# Patient Record
Sex: Female | Born: 1945
Health system: Southern US, Community
[De-identification: ages and names within clinical notes are randomized; demographics above are authoritative.]

## PROBLEM LIST (undated history)

## (undated) DIAGNOSIS — S069X9A Unspecified intracranial injury with loss of consciousness of unspecified duration, initial encounter: Secondary | ICD-10-CM

## (undated) DIAGNOSIS — Z950 Presence of cardiac pacemaker: Secondary | ICD-10-CM

## (undated) DIAGNOSIS — D649 Anemia, unspecified: Secondary | ICD-10-CM

## (undated) DIAGNOSIS — Z95810 Presence of automatic (implantable) cardiac defibrillator: Secondary | ICD-10-CM

## (undated) DIAGNOSIS — C4490 Unspecified malignant neoplasm of skin, unspecified: Secondary | ICD-10-CM

## (undated) DIAGNOSIS — Z8719 Personal history of other diseases of the digestive system: Secondary | ICD-10-CM

## (undated) DIAGNOSIS — E669 Obesity, unspecified: Secondary | ICD-10-CM

## (undated) DIAGNOSIS — R0602 Shortness of breath: Secondary | ICD-10-CM

## (undated) DIAGNOSIS — F32A Depression, unspecified: Secondary | ICD-10-CM

## (undated) DIAGNOSIS — I428 Other cardiomyopathies: Secondary | ICD-10-CM

## (undated) DIAGNOSIS — I10 Essential (primary) hypertension: Secondary | ICD-10-CM

## (undated) DIAGNOSIS — G43909 Migraine, unspecified, not intractable, without status migrainosus: Secondary | ICD-10-CM

## (undated) DIAGNOSIS — M1990 Unspecified osteoarthritis, unspecified site: Secondary | ICD-10-CM

## (undated) DIAGNOSIS — I509 Heart failure, unspecified: Secondary | ICD-10-CM

## (undated) DIAGNOSIS — I456 Pre-excitation syndrome: Secondary | ICD-10-CM

## (undated) DIAGNOSIS — Q8789 Other specified congenital malformation syndromes, not elsewhere classified: Secondary | ICD-10-CM

## (undated) DIAGNOSIS — G4733 Obstructive sleep apnea (adult) (pediatric): Secondary | ICD-10-CM

## (undated) DIAGNOSIS — M719 Bursopathy, unspecified: Secondary | ICD-10-CM

## (undated) DIAGNOSIS — I499 Cardiac arrhythmia, unspecified: Secondary | ICD-10-CM

## (undated) DIAGNOSIS — F329 Major depressive disorder, single episode, unspecified: Secondary | ICD-10-CM

## (undated) DIAGNOSIS — K219 Gastro-esophageal reflux disease without esophagitis: Secondary | ICD-10-CM

## (undated) DIAGNOSIS — F419 Anxiety disorder, unspecified: Secondary | ICD-10-CM

## (undated) DIAGNOSIS — J189 Pneumonia, unspecified organism: Secondary | ICD-10-CM

## (undated) HISTORY — PX: SKIN CANCER EXCISION: SHX779

## (undated) HISTORY — PX: CHOLECYSTECTOMY: SHX55

## (undated) HISTORY — DX: Obstructive sleep apnea (adult) (pediatric): G47.33

---
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
## 1976-04-11 HISTORY — PX: TUBAL LIGATION: SHX77

## 1997-04-11 DIAGNOSIS — C4490 Unspecified malignant neoplasm of skin, unspecified: Secondary | ICD-10-CM

## 1997-04-11 HISTORY — DX: Unspecified malignant neoplasm of skin, unspecified: C44.90

## 1997-04-11 HISTORY — PX: SKIN CANCER EXCISION: SHX779

## 1998-09-30 ENCOUNTER — Emergency Department (HOSPITAL_COMMUNITY): Admission: EM | Admit: 1998-09-30 | Discharge: 1998-09-30 | Payer: Self-pay | Admitting: Emergency Medicine

## 1998-12-12 ENCOUNTER — Emergency Department (HOSPITAL_COMMUNITY): Admission: EM | Admit: 1998-12-12 | Discharge: 1998-12-12 | Payer: Self-pay | Admitting: Emergency Medicine

## 1999-01-31 ENCOUNTER — Emergency Department (HOSPITAL_COMMUNITY): Admission: EM | Admit: 1999-01-31 | Discharge: 1999-01-31 | Payer: Self-pay | Admitting: Emergency Medicine

## 1999-01-31 ENCOUNTER — Encounter: Payer: Self-pay | Admitting: Emergency Medicine

## 1999-02-15 ENCOUNTER — Ambulatory Visit (HOSPITAL_COMMUNITY): Admission: RE | Admit: 1999-02-15 | Discharge: 1999-02-15 | Payer: Self-pay | Admitting: Emergency Medicine

## 1999-02-16 ENCOUNTER — Ambulatory Visit (HOSPITAL_COMMUNITY): Admission: RE | Admit: 1999-02-16 | Discharge: 1999-02-16 | Payer: Self-pay | Admitting: Emergency Medicine

## 1999-02-16 ENCOUNTER — Encounter: Payer: Self-pay | Admitting: Emergency Medicine

## 1999-05-13 ENCOUNTER — Ambulatory Visit (HOSPITAL_COMMUNITY): Admission: RE | Admit: 1999-05-13 | Discharge: 1999-05-13 | Payer: Self-pay | Admitting: Gastroenterology

## 1999-05-13 ENCOUNTER — Encounter: Payer: Self-pay | Admitting: Gastroenterology

## 1999-06-09 ENCOUNTER — Encounter: Payer: Self-pay | Admitting: Gastroenterology

## 1999-06-09 ENCOUNTER — Encounter: Admission: RE | Admit: 1999-06-09 | Discharge: 1999-06-09 | Payer: Self-pay | Admitting: Gastroenterology

## 1999-07-18 ENCOUNTER — Emergency Department (HOSPITAL_COMMUNITY): Admission: EM | Admit: 1999-07-18 | Discharge: 1999-07-18 | Payer: Self-pay | Admitting: *Deleted

## 1999-07-18 ENCOUNTER — Encounter: Payer: Self-pay | Admitting: Emergency Medicine

## 1999-07-19 ENCOUNTER — Encounter: Payer: Self-pay | Admitting: Emergency Medicine

## 1999-10-13 ENCOUNTER — Emergency Department (HOSPITAL_COMMUNITY): Admission: EM | Admit: 1999-10-13 | Discharge: 1999-10-13 | Payer: Self-pay | Admitting: Emergency Medicine

## 1999-10-13 ENCOUNTER — Encounter: Payer: Self-pay | Admitting: Emergency Medicine

## 1999-11-24 ENCOUNTER — Ambulatory Visit (HOSPITAL_COMMUNITY): Admission: RE | Admit: 1999-11-24 | Discharge: 1999-11-24 | Payer: Self-pay | Admitting: Gastroenterology

## 2000-05-15 ENCOUNTER — Other Ambulatory Visit: Admission: RE | Admit: 2000-05-15 | Discharge: 2000-05-15 | Payer: Self-pay | Admitting: Internal Medicine

## 2000-08-12 ENCOUNTER — Emergency Department (HOSPITAL_COMMUNITY): Admission: EM | Admit: 2000-08-12 | Discharge: 2000-08-12 | Payer: Self-pay | Admitting: Emergency Medicine

## 2000-08-12 ENCOUNTER — Encounter: Payer: Self-pay | Admitting: Internal Medicine

## 2000-08-23 ENCOUNTER — Encounter: Payer: Self-pay | Admitting: Internal Medicine

## 2000-08-23 ENCOUNTER — Encounter: Admission: RE | Admit: 2000-08-23 | Discharge: 2000-08-23 | Payer: Self-pay | Admitting: Internal Medicine

## 2000-11-21 ENCOUNTER — Emergency Department (HOSPITAL_COMMUNITY): Admission: EM | Admit: 2000-11-21 | Discharge: 2000-11-21 | Payer: Self-pay | Admitting: *Deleted

## 2001-01-12 ENCOUNTER — Emergency Department (HOSPITAL_COMMUNITY): Admission: EM | Admit: 2001-01-12 | Discharge: 2001-01-12 | Payer: Self-pay | Admitting: *Deleted

## 2001-01-12 ENCOUNTER — Encounter: Payer: Self-pay | Admitting: Emergency Medicine

## 2002-01-03 ENCOUNTER — Encounter: Payer: Self-pay | Admitting: Emergency Medicine

## 2002-01-03 ENCOUNTER — Emergency Department (HOSPITAL_COMMUNITY): Admission: EM | Admit: 2002-01-03 | Discharge: 2002-01-03 | Payer: Self-pay | Admitting: Emergency Medicine

## 2002-04-02 ENCOUNTER — Emergency Department (HOSPITAL_COMMUNITY): Admission: EM | Admit: 2002-04-02 | Discharge: 2002-04-02 | Payer: Self-pay | Admitting: Emergency Medicine

## 2002-07-18 ENCOUNTER — Observation Stay (HOSPITAL_COMMUNITY): Admission: EM | Admit: 2002-07-18 | Discharge: 2002-07-19 | Payer: Self-pay

## 2003-01-29 ENCOUNTER — Encounter: Admission: RE | Admit: 2003-01-29 | Discharge: 2003-01-29 | Payer: Self-pay | Admitting: Internal Medicine

## 2003-01-29 ENCOUNTER — Encounter: Payer: Self-pay | Admitting: Internal Medicine

## 2003-01-30 ENCOUNTER — Encounter: Payer: Self-pay | Admitting: General Surgery

## 2003-01-30 ENCOUNTER — Inpatient Hospital Stay (HOSPITAL_COMMUNITY): Admission: EM | Admit: 2003-01-30 | Discharge: 2003-01-31 | Payer: Self-pay | Admitting: Emergency Medicine

## 2005-05-10 ENCOUNTER — Ambulatory Visit (HOSPITAL_COMMUNITY): Admission: RE | Admit: 2005-05-10 | Discharge: 2005-05-10 | Payer: Self-pay | Admitting: Internal Medicine

## 2005-05-31 ENCOUNTER — Encounter: Admission: RE | Admit: 2005-05-31 | Discharge: 2005-05-31 | Payer: Self-pay | Admitting: Internal Medicine

## 2011-03-17 ENCOUNTER — Other Ambulatory Visit (HOSPITAL_COMMUNITY)
Admission: RE | Admit: 2011-03-17 | Discharge: 2011-03-17 | Disposition: A | Discharge status: Home / Self Care | Payer: Medicare Other | Source: Ambulatory Visit | Attending: Family Medicine | Admitting: Family Medicine

## 2011-03-17 ENCOUNTER — Other Ambulatory Visit: Payer: Self-pay

## 2011-03-17 ENCOUNTER — Other Ambulatory Visit: Payer: Self-pay | Admitting: Family Medicine

## 2011-03-17 DIAGNOSIS — Z01419 Encounter for gynecological examination (general) (routine) without abnormal findings: Secondary | ICD-10-CM | POA: Insufficient documentation

## 2011-03-17 DIAGNOSIS — N644 Mastodynia: Secondary | ICD-10-CM

## 2011-03-30 ENCOUNTER — Ambulatory Visit
Admission: RE | Admit: 2011-03-30 | Discharge: 2011-03-30 | Disposition: A | Discharge status: Home / Self Care | Payer: Medicare Other | Source: Ambulatory Visit | Attending: Family Medicine | Admitting: Family Medicine

## 2011-03-30 DIAGNOSIS — N644 Mastodynia: Secondary | ICD-10-CM

## 2011-04-27 ENCOUNTER — Other Ambulatory Visit: Payer: Self-pay | Admitting: Physician Assistant

## 2011-04-27 DIAGNOSIS — C4491 Basal cell carcinoma of skin, unspecified: Secondary | ICD-10-CM

## 2011-04-27 HISTORY — DX: Basal cell carcinoma of skin, unspecified: C44.91

## 2011-06-10 HISTORY — PX: MOHS SURGERY: SUR867

## 2011-09-07 ENCOUNTER — Other Ambulatory Visit: Payer: Self-pay | Admitting: Interventional Cardiology

## 2011-09-10 DIAGNOSIS — I509 Heart failure, unspecified: Secondary | ICD-10-CM

## 2011-09-10 HISTORY — DX: Heart failure, unspecified: I50.9

## 2011-09-12 ENCOUNTER — Encounter (HOSPITAL_BASED_OUTPATIENT_CLINIC_OR_DEPARTMENT_OTHER)
Admission: RE | Disposition: A | Discharge status: Home / Self Care | Payer: Self-pay | Source: Ambulatory Visit | Attending: Interventional Cardiology

## 2011-09-12 ENCOUNTER — Encounter (HOSPITAL_BASED_OUTPATIENT_CLINIC_OR_DEPARTMENT_OTHER): Payer: Self-pay | Admitting: *Deleted

## 2011-09-12 ENCOUNTER — Inpatient Hospital Stay (HOSPITAL_BASED_OUTPATIENT_CLINIC_OR_DEPARTMENT_OTHER)
Admission: RE | Admit: 2011-09-12 | Discharge: 2011-09-12 | Disposition: A | Discharge status: Home / Self Care | Payer: Medicare Other | Source: Ambulatory Visit | Attending: Interventional Cardiology | Admitting: Interventional Cardiology

## 2011-09-12 DIAGNOSIS — I519 Heart disease, unspecified: Secondary | ICD-10-CM | POA: Insufficient documentation

## 2011-09-12 DIAGNOSIS — I428 Other cardiomyopathies: Secondary | ICD-10-CM | POA: Insufficient documentation

## 2011-09-12 DIAGNOSIS — I2789 Other specified pulmonary heart diseases: Secondary | ICD-10-CM | POA: Insufficient documentation

## 2011-09-12 HISTORY — DX: Gastro-esophageal reflux disease without esophagitis: K21.9

## 2011-09-12 SURGERY — JV LEFT HEART CATHETERIZATION WITH CORONARY ANGIOGRAM
Anesthesia: Moderate Sedation

## 2011-09-12 MED ORDER — ACETAMINOPHEN 325 MG PO TABS: 650.0000 mg | ORAL_TABLET | ORAL | Status: DC | PRN

## 2011-09-12 MED ORDER — SODIUM CHLORIDE 0.9 % IV SOLN: INTRAVENOUS | Status: DC

## 2011-09-12 MED ORDER — SODIUM CHLORIDE 0.9 % IJ SOLN: 3.0000 mL | INTRAMUSCULAR | Status: DC | PRN

## 2011-09-12 MED ORDER — MORPHINE SULFATE 2 MG/ML IJ SOLN: 1.0000 mg | INTRAMUSCULAR | Status: DC | PRN

## 2011-09-12 MED ORDER — ASPIRIN 81 MG PO CHEW
324.0000 mg | CHEWABLE_TABLET | ORAL | Status: AC
  Administered 2011-09-12: 324 mg via ORAL

## 2011-09-12 MED ORDER — SODIUM CHLORIDE 0.9 % IV SOLN: 250.0000 mL | INTRAVENOUS | Status: DC | PRN

## 2011-09-12 MED ORDER — SODIUM CHLORIDE 0.9 % IJ SOLN: 3.0000 mL | Freq: Two times a day (BID) | INTRAMUSCULAR | Status: DC

## 2011-09-12 MED ORDER — ONDANSETRON HCL 4 MG/2ML IJ SOLN: 4.0000 mg | Freq: Four times a day (QID) | INTRAMUSCULAR | Status: DC | PRN

## 2011-09-12 MED ORDER — DIAZEPAM 5 MG PO TABS
5.0000 mg | ORAL_TABLET | ORAL | Status: AC
  Administered 2011-09-12: 5 mg via ORAL

## 2011-09-12 NOTE — Progress Notes (Signed)
Bedrest begins @ 1245. 

## 2011-09-12 NOTE — CV Procedure (Addendum)
PROCEDURE:  Left heart catheterization with selective coronary angiography, left ventriculogram. Right heart catheterization. Abdominal aortogram.  INDICATIONS:  Cardiomyopathy  The risks, benefits, and details of the procedure were explained to the patient.  The patient verbalized understanding and wanted to proceed.  Informed written consent was obtained.  PROCEDURE TECHNIQUE:  After Xylocaine anesthesia a 39F sheath was placed in the right common femoral vein, and a 82F sheath was placed in the right femoral artery with a single anterior needle wall stick.   Left coronary angiography was done using a Judkins L4 guide catheter.  Right coronary angiography was done using a Judkins R4 guide catheter.  Left ventriculography was done using a pigtail catheter.   The pigtail was pulled back to the abdominal aorta and a power injection of contrast was performed in the AP projection.   CONTRAST:  Total of 95 cc.  COMPLICATIONS:  None.    HEMODYNAMICS:  Aortic pressure was 130/72; LV pressure was 131/14; LVEDP 28.  There was no gradient between the left ventricle and aorta.  Right atrial pressure 12/9, mean right atrial pressure 9 mm Hg; RV 46/11, RVEDP 12 mm Hg, pulmonary artery 47/22, mean pulmonary artery 33 mm Hg, pulmonary capillary wedge pressure 20/18, mean PCWP 17; pulmonary artery sat 64%, aortic saturation 93%.  Rate output by Fick 4.7 L per minute, cardiac index 2.4.  ANGIOGRAPHIC DATA:   The left main coronary artery is a large vessel and widely patent..  The left anterior descending artery is a large vessel.  The first diagonal is medium size.  There is a small second diagonal.  The entire LAD system appears angiographically normal.  The left circumflex artery is a large dominant vessel.  There is a large first obtuse marginal.  The second obtuse marginal small.  The left PDA is small but widely patent.  The entire circumflex system appears angiographically normal.  The right coronary artery  is a small, nondominant vessel which is angiographically normal the and.  LEFT VENTRICULOGRAM:  Left ventricular angiogram was done in the 30 RAO projection and revealed severe, global left ventricular systolic dysfunction with an estimated ejection fraction of 20-25 %.  LVEDP was 28 mmHg. 2+ Mitral regurgitation.  ABDOMINAL AORTOGRAM: No AAA. Patent renal arteries bilaterally. Mild external iliac disease bilaterally.  Patent aortoiliac bifurcation.  IMPRESSIONS:  1. No significant coronary artery disease.  Left dominant system. 2. Severe, global left ventricular systolic dysfunction.  LVEDP 28 mmHg.  Ejection fraction 20-25 %. 3.  Moderate pulmonary hypertension.  Cardiac index greater than 2.  RECOMMENDATION:  Start ACE-I for LV dysfunction.  Will eventually need beta blocker as well.  Reassess LV function in a few months after medical therapy.  Continue diuretic.

## 2011-09-12 NOTE — H&P (Signed)
  Date of Initial H&P: 09/06/11  History reviewed, patient examined, no change in status, stable for surgery. 

## 2011-09-13 LAB — POCT I-STAT 3, ART BLOOD GAS (G3+)
pCO2 arterial: 43.8 mmHg (ref 35.0–45.0)
pH, Arterial: 7.357 (ref 7.350–7.400)
pO2, Arterial: 69 mmHg — ABNORMAL LOW (ref 80.0–100.0)

## 2011-09-13 LAB — POCT I-STAT 3, VENOUS BLOOD GAS (G3P V)
Bicarbonate: 24.3 mEq/L — ABNORMAL HIGH (ref 20.0–24.0)
O2 Saturation: 64 %
TCO2: 26 mmol/L (ref 0–100)
pCO2, Ven: 49 mmHg (ref 45.0–50.0)
pH, Ven: 7.303 — ABNORMAL HIGH (ref 7.250–7.300)
pO2, Ven: 37 mmHg (ref 30.0–45.0)

## 2011-09-18 HISTORY — PX: CARDIAC CATHETERIZATION: SHX172

## 2011-10-27 ENCOUNTER — Emergency Department (HOSPITAL_COMMUNITY)
Admission: EM | Admit: 2011-10-27 | Discharge: 2011-10-27 | Disposition: A | Discharge status: Home / Self Care | Payer: Medicare Other | Attending: Emergency Medicine | Admitting: Emergency Medicine

## 2011-10-27 ENCOUNTER — Encounter (HOSPITAL_COMMUNITY): Payer: Self-pay | Admitting: *Deleted

## 2011-10-27 ENCOUNTER — Emergency Department (HOSPITAL_COMMUNITY): Payer: Medicare Other

## 2011-10-27 DIAGNOSIS — R0602 Shortness of breath: Secondary | ICD-10-CM | POA: Insufficient documentation

## 2011-10-27 DIAGNOSIS — I10 Essential (primary) hypertension: Secondary | ICD-10-CM | POA: Insufficient documentation

## 2011-10-27 DIAGNOSIS — I509 Heart failure, unspecified: Secondary | ICD-10-CM | POA: Insufficient documentation

## 2011-10-27 DIAGNOSIS — J45909 Unspecified asthma, uncomplicated: Secondary | ICD-10-CM | POA: Insufficient documentation

## 2011-10-27 DIAGNOSIS — R0789 Other chest pain: Secondary | ICD-10-CM

## 2011-10-27 DIAGNOSIS — K219 Gastro-esophageal reflux disease without esophagitis: Secondary | ICD-10-CM | POA: Insufficient documentation

## 2011-10-27 DIAGNOSIS — R079 Chest pain, unspecified: Secondary | ICD-10-CM | POA: Insufficient documentation

## 2011-10-27 HISTORY — DX: Essential (primary) hypertension: I10

## 2011-10-27 HISTORY — DX: Heart failure, unspecified: I50.9

## 2011-10-27 LAB — COMPREHENSIVE METABOLIC PANEL
ALT: 11 U/L (ref 0–35)
AST: 17 U/L (ref 0–37)
Albumin: 3.8 g/dL (ref 3.5–5.2)
Alkaline Phosphatase: 82 U/L (ref 39–117)
Calcium: 9.3 mg/dL (ref 8.4–10.5)
Potassium: 3.9 mEq/L (ref 3.5–5.1)
Sodium: 140 mEq/L (ref 135–145)
Total Protein: 6.5 g/dL (ref 6.0–8.3)

## 2011-10-27 LAB — CBC
Hemoglobin: 12.1 g/dL (ref 12.0–15.0)
MCH: 30 pg (ref 26.0–34.0)
MCHC: 33.5 g/dL (ref 30.0–36.0)
Platelets: 228 10*3/uL (ref 150–400)

## 2011-10-27 LAB — APTT: aPTT: 29 seconds (ref 24–37)

## 2011-10-27 LAB — POCT I-STAT TROPONIN I: Troponin i, poc: 0 ng/mL (ref 0.00–0.08)

## 2011-10-27 LAB — D-DIMER, QUANTITATIVE: D-Dimer, Quant: 0.57 ug/mL-FEU — ABNORMAL HIGH (ref 0.00–0.48)

## 2011-10-27 MED ORDER — SODIUM CHLORIDE 0.9 % IV SOLN
1000.0000 mL | INTRAVENOUS | Status: DC
  Administered 2011-10-27: 500 mL via INTRAVENOUS

## 2011-10-27 MED ORDER — HYDROCODONE-ACETAMINOPHEN 5-325 MG PO TABS: 1.0000 | ORAL_TABLET | Freq: Four times a day (QID) | ORAL | Status: AC | PRN

## 2011-10-27 MED ORDER — MORPHINE SULFATE 4 MG/ML IJ SOLN
4.0000 mg | Freq: Once | INTRAMUSCULAR | Status: AC
  Administered 2011-10-27: 4 mg via INTRAVENOUS
  Filled 2011-10-27: qty 1

## 2011-10-27 NOTE — ED Notes (Signed)
Per EMS pt from home with c/o chest pain radiating to shoulder blades, began at 0700. IV 20G LAC. Pain associated with shortness of breath. Pt very anxious on EMS arrival. Dx with CHF, 20% EF. Pt given Nitro x 1 SL, aspirin 325 mg PO. VSS. Sinus brady 50's, started new beta blocker Monday- increased dose.

## 2011-10-27 NOTE — ED Notes (Signed)
Pt presents with sudden onset of mid-sternal chest pain that radiates into her back.  Pt reports cardiac cath done last month that was clear.  Pt reports episode of diaphoresis, denies any nausea.  Pt reports pain worsened as day progressed.  On arrival, pt reports onset of dizziness.  Pt reports on Monday, carvedilol dose was doubled, to "make my heart work better".

## 2011-10-27 NOTE — ED Provider Notes (Signed)
History     CSN: [id number] Arrival date & time 10/27/11  1206 First MD Initiated Contact with Patient 10/27/11 1207      Chief Complaint  Patient presents with  . Chest Pain    Patient is a 66 y.o. female presenting with chest pain. The history is provided by the patient.  Chest Pain The chest pain began 3 - 5 hours ago (Pt was helping her Mom get dressed when she suddenly had the pain.). Chest pain occurs constantly (the pain has been increasing since 7am.). The chest pain is worsening. The severity of the pain is severe. The quality of the pain is described as sharp. The pain radiates to the upper back. Primary symptoms include shortness of breath and dizziness. Pertinent negatives for primary symptoms include no fever, no nausea and no vomiting.  Dizziness does not occur with nausea or vomiting.  She tried nitroglycerin and aspirin (no change, treated en route by EMS) for the symptoms. Past medical history comments: CHF     Past Medical History  Diagnosis Date  . Asthma   . GERD (gastroesophageal reflux disease)   . CHF (congestive heart failure)   . Hypertension     History reviewed. No pertinent past surgical history.  No family history on file.  History  Substance Use Topics  . Smoking status: Never Smoker   . Smokeless tobacco: Not on file  . Alcohol Use: No    OB History    Grav Para Term Preterm Abortions TAB SAB Ect Mult Living                  Review of Systems  Constitutional: Negative for fever.  Respiratory: Positive for shortness of breath.   Cardiovascular: Positive for chest pain.  Gastrointestinal: Negative for nausea and vomiting.  Neurological: Positive for dizziness.  All other systems reviewed and are negative.    Allergies  Penicillins and Sulfonamide derivatives  Home Medications  No current outpatient prescriptions on file.  BP 103/51  Pulse 67  Temp 98.1 F (36.7 C)  Resp 18  SpO2 100%  Physical Exam  Nursing note and  vitals reviewed. Constitutional: She appears well-developed and well-nourished. No distress.  HENT:  Head: Normocephalic and atraumatic.  Right Ear: External ear normal.  Left Ear: External ear normal.  Eyes: Conjunctivae are normal. Right eye exhibits no discharge. Left eye exhibits no discharge. No scleral icterus.  Neck: Neck supple. No tracheal deviation present.  Cardiovascular: Normal rate, regular rhythm and intact distal pulses.   Pulmonary/Chest: Effort normal and breath sounds normal. No stridor. No respiratory distress. She has no wheezes. She has no rales. She exhibits tenderness.       Reproduces pain, increases with movement and palpatation of upper back as well  Abdominal: Soft. Bowel sounds are normal. She exhibits no distension. There is no tenderness. There is no rebound and no guarding.  Musculoskeletal: She exhibits no edema and no tenderness.  Neurological: She is alert. She has normal strength. No sensory deficit. Cranial nerve deficit:  no gross defecits noted. She exhibits normal muscle tone. She displays no seizure activity. Coordination normal.  Skin: Skin is warm and dry. No rash noted.  Psychiatric: She has a normal mood and affect.    ED Course  Procedures (including critical care time) EKG Rate 67 SINUS RHYTHM ~ normal P axis, V-rate 50- 99 NONSPECIFIC INTRAVENTRICULAR CONDUCTION DELAY ~ QRSd >184mS, not LBBB/RBBB ABNRM R PROG, CONSIDER ASMI OR LEAD PLACEMENT ~ Q >14mS,  diminished R, V2 MINIMAL ST DEPRESSION, DIFFUSE LEADS ~ ST <-0.51mV, ant/lat/inf Changed from prior EKG Labs Reviewed  COMPREHENSIVE METABOLIC PANEL - Abnormal; Notable for the following:    Total Bilirubin 0.2 (*)     GFR calc non Af Amer 75 (*)     GFR calc Af Amer 86 (*)     All other components within normal limits  D-DIMER, QUANTITATIVE - Abnormal; Notable for the following:    D-Dimer, Quant 0.57 (*)     All other components within normal limits  CBC  PROTIME-INR  APTT  POCT  I-STAT TROPONIN I   Dg Chest 2 View  10/27/2011  *RADIOLOGY REPORT*  Clinical Data: 66 year old female with chest pain.  CHEST - 2 VIEW  Comparison: None  Findings: Upper limits normal heart size. There is no evidence of focal airspace disease, pulmonary edema, suspicious pulmonary nodule/mass, pleural effusion, or pneumothorax. No acute bony abnormalities are identified.  IMPRESSION: No evidence of active cardiopulmonary disease.  Original Report Authenticated By: Rosendo Gros, M.D.    Prior Cath: June 2013 LEFT VENTRICULOGRAM: Left ventricular angiogram was done in the 30 RAO projection and revealed severe, global left ventricular systolic dysfunction with an estimated ejection fraction of 20-25 %. LVEDP was 28 mmHg. 2+ Mitral regurgitation.  ABDOMINAL AORTOGRAM: No AAA. Patent renal arteries bilaterally. Mild external iliac disease bilaterally. Patent aortoiliac bifurcation.  IMPRESSIONS:  1. No significant coronary artery disease. Left dominant system. 2. Severe, global left ventricular systolic dysfunction. LVEDP 28 mmHg. Ejection fraction 20-25 %. 3. Moderate pulmonary hypertension. Cardiac index greater than 2.  RECOMMENDATION: Start ACE-I for LV dysfunction. Will eventually need beta blocker as well. Reassess LV function in a few months after medical therapy. Continue diuretic.         MDM  Pt has no significant coronary artery disease based on her cardiac catheterization performed this June. The patient's chest pain started when she was bending over in helping her mother or get dressed.  On exam she has very sharp reproducible chest wall tenderness. Patient has a trivially elevated d-dimer. She has no leg swelling. Overall I feel she is low risk for pulmonary embolism. At this time he did not feel that a CT scan of her chest as indicated. Patient will be treated for chest wall pain. She is feeling better after medications here in the emergency department. I encouraged her to follow up  with her primary Dr. and to return to emergency room for any worsening symptoms        Celene Kras, MD 10/27/11 1419

## 2012-02-28 ENCOUNTER — Encounter: Payer: Self-pay | Admitting: Internal Medicine

## 2012-02-28 ENCOUNTER — Ambulatory Visit (INDEPENDENT_AMBULATORY_CARE_PROVIDER_SITE_OTHER): Payer: Medicare Other | Admitting: Internal Medicine

## 2012-02-28 ENCOUNTER — Encounter: Payer: Self-pay | Admitting: *Deleted

## 2012-02-28 VITALS — BP 128/78 | HR 70 | Resp 16 | Ht 64.0 in | Wt 199.0 lb

## 2012-02-28 DIAGNOSIS — I509 Heart failure, unspecified: Secondary | ICD-10-CM

## 2012-02-28 DIAGNOSIS — I456 Pre-excitation syndrome: Secondary | ICD-10-CM

## 2012-02-28 DIAGNOSIS — I5022 Chronic systolic (congestive) heart failure: Secondary | ICD-10-CM

## 2012-02-28 DIAGNOSIS — I519 Heart disease, unspecified: Secondary | ICD-10-CM

## 2012-02-28 DIAGNOSIS — R55 Syncope and collapse: Secondary | ICD-10-CM

## 2012-02-28 NOTE — Patient Instructions (Signed)
Your physician has recommended that you have an ablation. Catheter ablation is a medical procedure used to treat some cardiac arrhythmias (irregular heartbeats). During catheter ablation, a long, thin, flexible tube is put into a blood vessel in your groin (upper thigh), or neck. This tube is called an ablation catheter. It is then guided to your heart through the blood vessel. Radio frequency waves destroy small areas of heart tissue where abnormal heartbeats may cause an arrhythmia to start. Please see the instruction sheet given to you today.    Your physician has recommended that you have a defibrillator inserted. An implantable cardioverter defibrillator (ICD) is a small device that is placed in your chest or, in rare cases, your abdomen. This device uses electrical pulses or shocks to help control life-threatening, irregular heartbeats that could lead the heart to suddenly stop beating (sudden cardiac arrest). Leads are attached to the ICD that goes into your heart. This is done in the hospital and usually requires an overnight stay. Please see the instruction sheet given to you today for more information.

## 2012-03-01 ENCOUNTER — Encounter: Payer: Self-pay | Admitting: Internal Medicine

## 2012-03-01 DIAGNOSIS — I456 Pre-excitation syndrome: Secondary | ICD-10-CM | POA: Insufficient documentation

## 2012-03-01 DIAGNOSIS — R55 Syncope and collapse: Secondary | ICD-10-CM | POA: Insufficient documentation

## 2012-03-01 DIAGNOSIS — I5023 Acute on chronic systolic (congestive) heart failure: Secondary | ICD-10-CM | POA: Insufficient documentation

## 2012-03-01 NOTE — Assessment & Plan Note (Signed)
Her CHF is class 2 but well compensated. With her history of syncope and LV dysfunction, we have recommended ICD implant.

## 2012-03-01 NOTE — Progress Notes (Signed)
HPI Paula Booker is referred today by Dr. 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 The patient notes that she has had palpitations for many years. She has never sought medical attention. The patient has had 3 syncopal episodes, but does not feel palpitations prior to her syncopal episode. She has noted that other episodes occur without frank syncope. No other complaints. She is referred for consideration of an ICD as she has all of the above with an EF of 20%. Of note after left heart catheterization 6 months ago, she was placed on medical therapy with beta blockers and ARB drugs. Her repeat echo a few weeks ago demonstrated an EF of 20%. Allergies  Allergen Reactions  . Penicillins Anaphylaxis  . Sulfonamide Derivatives Other (See Comments)    Migrains     Current Outpatient Prescriptions  Medication Sig Dispense Refill  . albuterol (PROVENTIL HFA;VENTOLIN HFA) 108 (90 BASE) MCG/ACT inhaler Inhale 2 puffs into the lungs every 6 (six) hours as needed. For wheezing      . carvedilol (COREG) 12.5 MG tablet Take 25 mg by mouth 2 (two) times daily with a meal.      . cetirizine (ZYRTEC) 10 MG tablet Take 10 mg by mouth daily.      . Cholecalciferol (VITAMIN D3) 1000 UNITS CAPS Take 1 capsule by mouth daily.      . citalopram (CELEXA) 20 MG tablet Take 20 mg by mouth daily.      . Fluticasone-Salmeterol (ADVAIR) 250-50 MCG/DOSE AEPB Inhale 1 puff into the lungs every 12 (twelve) hours.      . furosemide (LASIX) 40 MG tablet Take 40 mg by mouth daily.      . GELATIN PO Take by mouth.      . HYDROCODONE-ACETAMINOPHEN PO Take by mouth.      . losartan (COZAAR) 50 MG tablet Take 50 mg by mouth daily.      . Misc Natural Products (OSTEO BI-FLEX ADV DOUBLE ST PO) Take 2 tablets by mouth every morning.      . naproxen sodium (ANAPROX) 220 MG tablet Take 440 mg by mouth daily.      . omeprazole (PRILOSEC) 20 MG capsule Take 40 mg by mouth daily.       . potassium chloride SA (K-DUR,KLOR-CON) 20 MEQ tablet Take 20 mEq by mouth daily.         Past Medical History  Diagnosis Date  . Asthma   . GERD (gastroesophageal reflux disease)   . CHF (congestive heart failure)   . Hypertension     ROS:   All systems reviewed and negative except as noted in the HPI.   No past surgical history on file.   No family history on file.   History   Social History  . Marital Status: Single    Spouse Name: N/A    Number of Children: N/A  . Years of Education: N/A   Occupational History  . Not on file.   Social History Main Topics  . Smoking status: Never Smoker   . Smokeless tobacco: Not on file  . Alcohol Use: No  . Drug Use: No  . Sexually Active:    Other Topics Concern  . Not on file   Social History Narrative  . No narrative on file     BP 128/78  Pulse 70  Resp 16  Ht 5' 4" (1.626 m)  Wt 199 lb (90.266 kg)    BMI 34.16 kg/m2  Physical Exam:  Well appearing middle age man, NAD HEENT: Unremarkable Neck:  No JVD, no thyromegally Lungs:  Clear with no wheezes, rales, or rhonchi. HEART:  Regular rate rhythm, no murmurs, no rubs, no clicks Abd:  soft, positive bowel sounds, no organomegally, no rebound, no guarding Ext:  2 plus pulses, no edema, no cyanosis, no clubbing Skin:  No rashes no nodules Neuro:  CN II through XII intact, motor grossly intact  EKG NSR with WPW syndrome    Assess/Plan:  

## 2012-03-01 NOTE — Assessment & Plan Note (Signed)
Her symptoms are not well controlled. I have recommended EPS/RFA and ICD implant. That said, she may have orthostasis complicating there treatment.

## 2012-03-01 NOTE — Assessment & Plan Note (Signed)
She has pre-excitation on her ECG. She has had a h/o palpitations. Will plan EP study and possible ablation if she has inducble SVT.

## 2012-03-02 ENCOUNTER — Other Ambulatory Visit: Payer: Self-pay | Admitting: *Deleted

## 2012-03-02 DIAGNOSIS — I519 Heart disease, unspecified: Secondary | ICD-10-CM

## 2012-03-02 DIAGNOSIS — I456 Pre-excitation syndrome: Secondary | ICD-10-CM

## 2012-03-07 ENCOUNTER — Encounter (HOSPITAL_COMMUNITY): Payer: Self-pay | Admitting: Pharmacy Technician

## 2012-03-11 DIAGNOSIS — Z95810 Presence of automatic (implantable) cardiac defibrillator: Secondary | ICD-10-CM

## 2012-03-11 HISTORY — DX: Presence of automatic (implantable) cardiac defibrillator: Z95.810

## 2012-03-15 ENCOUNTER — Other Ambulatory Visit (INDEPENDENT_AMBULATORY_CARE_PROVIDER_SITE_OTHER): Payer: Medicare Other

## 2012-03-15 DIAGNOSIS — I456 Pre-excitation syndrome: Secondary | ICD-10-CM

## 2012-03-15 DIAGNOSIS — I519 Heart disease, unspecified: Secondary | ICD-10-CM

## 2012-03-15 LAB — CBC WITH DIFFERENTIAL/PLATELET
Basophils Relative: 0.3 % (ref 0.0–3.0)
Eosinophils Absolute: 0.4 10*3/uL (ref 0.0–0.7)
HCT: 38.1 % (ref 36.0–46.0)
Hemoglobin: 12.5 g/dL (ref 12.0–15.0)
MCHC: 32.8 g/dL (ref 30.0–36.0)
MCV: 91.6 fl (ref 78.0–100.0)
Monocytes Absolute: 0.7 10*3/uL (ref 0.1–1.0)
Neutro Abs: 3 10*3/uL (ref 1.4–7.7)
RBC: 4.16 Mil/uL (ref 3.87–5.11)

## 2012-03-15 LAB — BASIC METABOLIC PANEL
CO2: 32 mEq/L (ref 19–32)
Chloride: 102 mEq/L (ref 96–112)
Sodium: 140 mEq/L (ref 135–145)

## 2012-03-21 MED ORDER — SODIUM CHLORIDE 0.9 % IR SOLN
80.0000 mg | Status: DC
  Filled 2012-03-21: qty 2

## 2012-03-21 MED ORDER — VANCOMYCIN HCL IN DEXTROSE 1-5 GM/200ML-% IV SOLN
1000.0000 mg | INTRAVENOUS | Status: DC
  Filled 2012-03-21 (×2): qty 200

## 2012-03-22 ENCOUNTER — Encounter (HOSPITAL_COMMUNITY)
Admission: RE | Disposition: A | Discharge status: Home / Self Care | Payer: Self-pay | Source: Ambulatory Visit | Attending: Internal Medicine

## 2012-03-22 ENCOUNTER — Encounter (HOSPITAL_COMMUNITY): Payer: Self-pay | Admitting: General Practice

## 2012-03-22 ENCOUNTER — Ambulatory Visit (HOSPITAL_COMMUNITY)
Admission: RE | Admit: 2012-03-22 | Discharge: 2012-03-23 | Disposition: A | Discharge status: Home / Self Care | Payer: Medicare Other | Source: Ambulatory Visit | Attending: Internal Medicine | Admitting: Internal Medicine

## 2012-03-22 DIAGNOSIS — R55 Syncope and collapse: Secondary | ICD-10-CM

## 2012-03-22 DIAGNOSIS — I428 Other cardiomyopathies: Secondary | ICD-10-CM | POA: Insufficient documentation

## 2012-03-22 DIAGNOSIS — I5022 Chronic systolic (congestive) heart failure: Secondary | ICD-10-CM

## 2012-03-22 DIAGNOSIS — Z79899 Other long term (current) drug therapy: Secondary | ICD-10-CM | POA: Insufficient documentation

## 2012-03-22 DIAGNOSIS — I519 Heart disease, unspecified: Secondary | ICD-10-CM

## 2012-03-22 DIAGNOSIS — I10 Essential (primary) hypertension: Secondary | ICD-10-CM | POA: Insufficient documentation

## 2012-03-22 DIAGNOSIS — I456 Pre-excitation syndrome: Secondary | ICD-10-CM | POA: Insufficient documentation

## 2012-03-22 HISTORY — PX: SUPRAVENTRICULAR TACHYCARDIA ABLATION: SHX5492

## 2012-03-22 HISTORY — PX: IMPLANTABLE CARDIOVERTER DEFIBRILLATOR IMPLANT: SHX5473

## 2012-03-22 HISTORY — PX: SUPRAVENTRICULAR TACHYCARDIA ABLATION: SHX6106

## 2012-03-22 HISTORY — DX: Unspecified malignant neoplasm of skin, unspecified: C44.90

## 2012-03-22 HISTORY — DX: Shortness of breath: R06.02

## 2012-03-22 HISTORY — DX: Presence of automatic (implantable) cardiac defibrillator: Z95.810

## 2012-03-22 HISTORY — DX: Unspecified osteoarthritis, unspecified site: M19.90

## 2012-03-22 HISTORY — PX: CARDIAC DEFIBRILLATOR PLACEMENT: SHX171

## 2012-03-22 HISTORY — DX: Migraine, unspecified, not intractable, without status migrainosus: G43.909

## 2012-03-22 SURGERY — SUPRAVENTRICULAR TACHYCARDIA ABLATION
Anesthesia: LOCAL

## 2012-03-22 MED ORDER — MUPIROCIN 2 % EX OINT
TOPICAL_OINTMENT | Freq: Two times a day (BID) | CUTANEOUS | Status: DC
  Administered 2012-03-22: 1 via NASAL
  Filled 2012-03-22: qty 22

## 2012-03-22 MED ORDER — MIDAZOLAM HCL 5 MG/5ML IJ SOLN
INTRAMUSCULAR | Status: AC
  Filled 2012-03-22: qty 5

## 2012-03-22 MED ORDER — SODIUM CHLORIDE 0.9 % IV SOLN: 250.0000 mL | INTRAVENOUS | Status: DC

## 2012-03-22 MED ORDER — HYDROXYUREA 500 MG PO CAPS
ORAL_CAPSULE | ORAL | Status: AC
  Filled 2012-03-22: qty 1

## 2012-03-22 MED ORDER — YOU HAVE A PACEMAKER BOOK
Freq: Once | Status: DC
  Filled 2012-03-22 (×2): qty 1

## 2012-03-22 MED ORDER — SODIUM CHLORIDE 0.45 % IV SOLN
INTRAVENOUS | Status: DC
  Administered 2012-03-22: 10:00:00 via INTRAVENOUS

## 2012-03-22 MED ORDER — FAMOTIDINE IN NACL 20-0.9 MG/50ML-% IV SOLN
20.0000 mg | Freq: Once | INTRAVENOUS | Status: AC
  Administered 2012-03-22: 20 mg via INTRAVENOUS

## 2012-03-22 MED ORDER — FUROSEMIDE 40 MG PO TABS
40.0000 mg | ORAL_TABLET | Freq: Every day | ORAL | Status: DC
  Administered 2012-03-22 – 2012-03-23 (×2): 40 mg via ORAL
  Filled 2012-03-22 (×2): qty 1

## 2012-03-22 MED ORDER — SODIUM CHLORIDE 0.9 % IJ SOLN: 3.0000 mL | INTRAMUSCULAR | Status: DC | PRN

## 2012-03-22 MED ORDER — MUPIROCIN 2 % EX OINT
TOPICAL_OINTMENT | CUTANEOUS | Status: AC
  Administered 2012-03-22: 1 via NASAL
  Filled 2012-03-22: qty 22

## 2012-03-22 MED ORDER — DIPHENHYDRAMINE HCL 50 MG/ML IJ SOLN
INTRAMUSCULAR | Status: AC
  Filled 2012-03-22: qty 1

## 2012-03-22 MED ORDER — ONDANSETRON HCL 4 MG/2ML IJ SOLN: 4.0000 mg | Freq: Four times a day (QID) | INTRAMUSCULAR | Status: DC | PRN

## 2012-03-22 MED ORDER — FENTANYL CITRATE 0.05 MG/ML IJ SOLN
INTRAMUSCULAR | Status: AC
  Filled 2012-03-22: qty 2

## 2012-03-22 MED ORDER — CHLORHEXIDINE GLUCONATE 4 % EX LIQD
60.0000 mL | Freq: Once | CUTANEOUS | Status: DC
  Filled 2012-03-22: qty 60

## 2012-03-22 MED ORDER — DIPHENHYDRAMINE HCL 50 MG/ML IJ SOLN
25.0000 mg | Freq: Once | INTRAMUSCULAR | Status: AC
  Administered 2012-03-22: 25 mg via INTRAVENOUS

## 2012-03-22 MED ORDER — SODIUM CHLORIDE 0.9 % IJ SOLN: 3.0000 mL | Freq: Two times a day (BID) | INTRAMUSCULAR | Status: DC

## 2012-03-22 MED ORDER — POTASSIUM CHLORIDE CRYS ER 10 MEQ PO TBCR
10.0000 meq | EXTENDED_RELEASE_TABLET | Freq: Two times a day (BID) | ORAL | Status: DC
  Administered 2012-03-22 – 2012-03-23 (×2): 10 meq via ORAL
  Filled 2012-03-22 (×3): qty 1

## 2012-03-22 MED ORDER — LOSARTAN POTASSIUM 50 MG PO TABS
50.0000 mg | ORAL_TABLET | Freq: Every day | ORAL | Status: DC
  Administered 2012-03-23: 50 mg via ORAL
  Filled 2012-03-22: qty 1

## 2012-03-22 MED ORDER — METHYLPREDNISOLONE SODIUM SUCC 125 MG IJ SOLR
125.0000 mg | Freq: Once | INTRAMUSCULAR | Status: AC
  Administered 2012-03-22: 125 mg via INTRAVENOUS

## 2012-03-22 MED ORDER — CARVEDILOL 25 MG PO TABS
25.0000 mg | ORAL_TABLET | Freq: Two times a day (BID) | ORAL | Status: DC
  Filled 2012-03-22 (×2): qty 1

## 2012-03-22 MED ORDER — METHYLPREDNISOLONE SODIUM SUCC 125 MG IJ SOLR
INTRAMUSCULAR | Status: AC
  Filled 2012-03-22: qty 2

## 2012-03-22 MED ORDER — VANCOMYCIN HCL 10 G IV SOLR
2000.0000 mg | Freq: Two times a day (BID) | INTRAVENOUS | Status: AC
  Administered 2012-03-23: 03:00:00 2000 mg via INTRAVENOUS
  Filled 2012-03-22: qty 2000

## 2012-03-22 MED ORDER — LIDOCAINE HCL (PF) 1 % IJ SOLN
INTRAMUSCULAR | Status: AC
  Filled 2012-03-22: qty 60

## 2012-03-22 MED ORDER — PANTOPRAZOLE SODIUM 40 MG PO TBEC
40.0000 mg | DELAYED_RELEASE_TABLET | Freq: Every day | ORAL | Status: DC
  Administered 2012-03-22 – 2012-03-23 (×2): 40 mg via ORAL
  Filled 2012-03-22 (×2): qty 1

## 2012-03-22 MED ORDER — CITALOPRAM HYDROBROMIDE 20 MG PO TABS
20.0000 mg | ORAL_TABLET | Freq: Every day | ORAL | Status: DC
  Administered 2012-03-23: 20 mg via ORAL
  Filled 2012-03-22: qty 1

## 2012-03-22 MED ORDER — ACETAMINOPHEN 325 MG PO TABS
325.0000 mg | ORAL_TABLET | ORAL | Status: DC | PRN
  Administered 2012-03-23: 08:00:00 650 mg via ORAL
  Filled 2012-03-22: qty 2

## 2012-03-22 MED ORDER — HEPARIN (PORCINE) IN NACL 2-0.9 UNIT/ML-% IJ SOLN
INTRAMUSCULAR | Status: AC
  Filled 2012-03-22: qty 500

## 2012-03-22 NOTE — Interval H&P Note (Signed)
History and Physical Interval Note:  03/22/2012 12:05 PM  Paula Booker  has presented today for surgery, with the diagnosis of wpw/LV disfunction  The various methods of treatment have been discussed with the patient and family. After consideration of risks, benefits and other options for treatment, the patient has consented to  Procedure(s) (LRB) with comments: SUPRAVENTRICULAR TACHYCARDIA ABLATION (N/A) IMPLANTABLE CARDIOVERTER DEFIBRILLATOR IMPLANT (N/A) as a surgical intervention .  The patient's history has been reviewed, patient examined, no change in status, stable for surgery.  I have reviewed the patient's chart and labs.  Questions were answered to the patient's satisfaction.     Leonia Reeves.D.

## 2012-03-22 NOTE — Interval H&P Note (Signed)
History and Physical Interval Note:  03/22/2012 11:36 AM  Paula Booker  has presented today for surgery, with the diagnosis of wpw/LV disfunction  The various methods of treatment have been discussed with the patient and family. After consideration of risks, benefits and other options for treatment, the patient has consented to  Procedure(s) (LRB) with comments: SUPRAVENTRICULAR TACHYCARDIA ABLATION (N/A) IMPLANTABLE CARDIOVERTER DEFIBRILLATOR IMPLANT (N/A) as a surgical intervention .  The patient's history has been reviewed, patient examined, no change in status, stable for surgery.  I have reviewed the patient's chart and labs.  Questions were answered to the patient's satisfaction.     Leonia Reeves.D.

## 2012-03-22 NOTE — H&P (View-Only) (Signed)
HPI Paula Booker is referred today by Dr. 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 The patient notes that she has had palpitations for many years. She has never sought medical attention. The patient has had 3 syncopal episodes, but does not feel palpitations prior to her syncopal episode. She has noted that other episodes occur without frank syncope. No other complaints. She is referred for consideration of an ICD as she has all of the above with an EF of 20%. Of note after left heart catheterization 6 months ago, she was placed on medical therapy with beta blockers and ARB drugs. Her repeat echo a few weeks ago demonstrated an EF of 20%. Allergies  Allergen Reactions  . Penicillins Anaphylaxis  . Sulfonamide Derivatives Other (See Comments)    Migrains     Current Outpatient Prescriptions  Medication Sig Dispense Refill  . albuterol (PROVENTIL HFA;VENTOLIN HFA) 108 (90 BASE) MCG/ACT inhaler Inhale 2 puffs into the lungs every 6 (six) hours as needed. For wheezing      . carvedilol (COREG) 12.5 MG tablet Take 25 mg by mouth 2 (two) times daily with a meal.      . cetirizine (ZYRTEC) 10 MG tablet Take 10 mg by mouth daily.      . Cholecalciferol (VITAMIN D3) 1000 UNITS CAPS Take 1 capsule by mouth daily.      . citalopram (CELEXA) 20 MG tablet Take 20 mg by mouth daily.      . Fluticasone-Salmeterol (ADVAIR) 250-50 MCG/DOSE AEPB Inhale 1 puff into the lungs every 12 (twelve) hours.      . furosemide (LASIX) 40 MG tablet Take 40 mg by mouth daily.      Marland Kitchen GELATIN PO Take by mouth.      Marland Kitchen HYDROCODONE-ACETAMINOPHEN PO Take by mouth.      . losartan (COZAAR) 50 MG tablet Take 50 mg by mouth daily.      . Misc Natural Products (OSTEO BI-FLEX ADV DOUBLE ST PO) Take 2 tablets by mouth every morning.      . naproxen sodium (ANAPROX) 220 MG tablet Take 440 mg by mouth daily.      Marland Kitchen omeprazole (PRILOSEC) 20 MG capsule Take 40 mg by mouth daily.       . potassium chloride SA (K-DUR,KLOR-CON) 20 MEQ tablet Take 20 mEq by mouth daily.         Past Medical History  Diagnosis Date  . Asthma   . GERD (gastroesophageal reflux disease)   . CHF (congestive heart failure)   . Hypertension     ROS:   All systems reviewed and negative except as noted in the HPI.   No past surgical history on file.   No family history on file.   History   Social History  . Marital Status: Single    Spouse Name: N/A    Number of Children: N/A  . Years of Education: N/A   Occupational History  . Not on file.   Social History Main Topics  . Smoking status: Never Smoker   . Smokeless tobacco: Not on file  . Alcohol Use: No  . Drug Use: No  . Sexually Active:    Other Topics Concern  . Not on file   Social History Narrative  . No narrative on file     BP 128/78  Pulse 70  Resp 16  Ht 5\' 4"  (1.626 m)  Wt 199 lb (90.266 kg)  BMI 34.16 kg/m2  Physical Exam:  Well appearing middle age man, NAD HEENT: Unremarkable Neck:  No JVD, no thyromegally Lungs:  Clear with no wheezes, rales, or rhonchi. HEART:  Regular rate rhythm, no murmurs, no rubs, no clicks Abd:  soft, positive bowel sounds, no organomegally, no rebound, no guarding Ext:  2 plus pulses, no edema, no cyanosis, no clubbing Skin:  No rashes no nodules Neuro:  CN II through XII intact, motor grossly intact  EKG NSR with WPW syndrome    Assess/Plan:

## 2012-03-22 NOTE — Op Note (Signed)
Invasive EP study with Isuprel followed by DDD ICD implant without immediate complication. J#[id number], and [id number].

## 2012-03-22 NOTE — Progress Notes (Signed)
Orthopedic Tech Progress Note Patient Details:  Paula Booker January 08, 1946 [id number]  Ortho Devices Type of Ortho Device: Arm sling Ortho Device/Splint Location: left arm Ortho Device/Splint Interventions: Application   Nikki Dom 03/22/2012, 8:37 PM

## 2012-03-23 ENCOUNTER — Ambulatory Visit (HOSPITAL_COMMUNITY): Payer: Medicare Other

## 2012-03-23 NOTE — Op Note (Signed)
Paula Booker, JAREMA NO.:  [id number]  MEDICAL RECORD NO.:  [id number]  LOCATION:  6525                         FACILITY:  [facility]  PHYSICIAN:  Doylene Canning. Ladona Ridgel, MD    DATE OF BIRTH:  [birth year]  DATE OF PROCEDURE:  03/22/2012 DATE OF DISCHARGE:                              OPERATIVE REPORT   PROCEDURE PERFORMED:  Invasive electrophysiologic study utilizing isoproterenol, followed by insertion of a dual-chamber ICD.  INTRODUCTION:  The patient is a very pleasant 66 year old woman with a nonischemic cardiomyopathy, chronic class 2 systolic heart failure, and manifest pre-excitation on her baseline 12-lead electrocardiogram.  She has a history of tachy palpitations, but no documented SVT.  The patient also has a history of syncope of unclear etiology.  Approximately 9 months ago, she was found to have severe left ventricular dysfunction with an ejection fraction of 20% to 25%.  On maximal medical therapy, including beta-blockers and ACE inhibitors, her ejection fraction is now 30%.  She.  She continues to have class 2 heart failure symptoms.  She is now referred for invasive electrophysiologic study and possible catheter ablation of her WPW syndrome as well as insertion of an ICD for primary prevention of ventricular arrhythmias in the setting of an ejection fraction of 30% and syncope.  PROCEDURE:  After informed was obtained, the patient was taken to the diagnostic EP lab in a fasting state.  After usual preparation and draping, intravenous fentanyl and midazolam was given for sedation.  A 6- [place] Hexapolar catheter was inserted percutaneously into the right jugular vein and advanced to the coronary sinus.  A 6-French quadripolar catheter was inserted percutaneously in the right femoral vein and advanced to the right ventricle.  A 6-French quadripolar catheter was inserted percutaneously in the right femoral vein and advanced to the His bundle region.  A  6-French quadripolar catheter was inserted percutaneously in the right femoral vein and advanced to the right atrium.  After measurement of basic intervals, rapid ventricular pacing was carried out, which demonstrated VA dissociation at 600 milliseconds. Programed ventricular stimulation was carried out also demonstrating VA dissociation at 600 milliseconds.  Rapid atrial pacing was carried out from the coronary sinus, which demonstrated an AV Wenckebach cycle length of 340 milliseconds.  It should be noted that the patient had a manifest pre-excitation which did not terminate during atrial pacing. It was also interesting to note that the patient's PR interval during pacing would increase despite a lack of loss of pre-excitation. Programed atrial stimulation was carried out in the atrium at a base drive cycle length of 742 milliseconds and stepwise decreased down to 90 milliseconds where atrial refractoriness was observed.  During programed atrial stimulation, manifest pre-excitation remained present but there was no inducible SVT.  At this point, isoproterenol was infused at rates from 1-4 mcg per minute.  Rapid ventricular pacing, programed ventricular stimulation, programed atrial stimulation, and rapid atrial pacing were all carried out.  There was no inducible SVT.  At maximal infusions of isoproterenol, rapid ventricular pacing did demonstrate eccentric atrial activation with the earliest atrial activation in the proximal portion of the coronary sinus.  Catheter was placed on the lateral portion  of the tricuspid valve anulus did not appear to be early.  Activation was earlier in the coronary sinus than was in the His bundle region.  This was present during rapid ventricular pacing as well as programed ventricular stimulation on isoproterenol.  Despite this eccentric atrial activation, however, there was no inducible SVT.  In the same way programed atrial stimulation was carried out  at base drive cycle lengths rates of 500, 400, 600 milliseconds on isoproterenol. During programed atrial stimulation, there remained a manifest pre- excitation which increased in magnitude with rapid atrial pacing rates. However, there was no inducible SVT.  Programed atrial stimulation was also carried out on isoproterenol, and again manifest pre-excitation was present but there was no inducible SVT.  In the same way, rapid atrial pacing was carried out down to 200 milliseconds, and there was no inducible atrial fibrillation or flutter.  At this point,  the patient's catheters were removed, and she was prepped for insertion of an ICD. This will be dictated on a new procedure.  COMPLICATIONS:  There were no immediate procedure complications.  RESULTS:  A.  Baseline ECG:  Baseline ECG demonstrates sinus rhythm with normal axis.  There was manifest pre-excitation. B.  Baseline intervals:  The sinus node cycle length was 810 milliseconds.  The QRS duration was 120 millisecond, the HV interval 45 milliseconds, AH interval 71 milliseconds.  QRS duration was 120 milliseconds. C.  Rapid ventricular pacing:  Rapid ventricular pacing was carried out from the right ventricle.  Baseline demonstrated VA dissociation.  On isoproterenol rapid ventricular pacing was carried out, demonstrating eccentric atrial activation.  During rapid ventricular pacing, there was no inducible SVT. D.  Programed ventricular stimulation:  Programed ventricular stimulation was carried out from the right ventricle at base drive cycle length of 811, 500, and 400 milliseconds.  S1, S2 interval stepwise decreased down to ventricular refractoriness.  During programed ventricular stimulation, there was intermittent eccentric atrial activation on high doses of isoproterenol but no inducible SVT.  No inducible VT. E.  Rapid atrial pacing:  Rapid atrial pacing was carried out from the high right atrium as well as coronary  sinus at base drive cycle length of 914 milliseconds, stepwise decreased down to 340 milliseconds where AV Wenckebach was observed.  During rapid atrial pacing the PR interval was less than the RR interval and there was no inducible SVT.  During rapid atrial pacing, there was manifest pre-excitation with the QRS being more pre-excited. F.  Programed atrial stimulation:  Programed atrial stimulation was carried out in the atrium at base drive cycle length of 782, 500, and 400 milliseconds.  The S1, S2 interval stepwise decreased down to atrial refractoriness.  During programed atrial stimulation, there were multiple age jumps and echo beats, but no inducible SVT. G.  Arrhythmia was observed:  There were no inducible arrhythmias. H.  Mapping:  Mapping of the accessory pathway during rapid atrial pacing demonstrated a right lateral or perhaps right high lateral accessory pathway.  Mapping on isoproterenol during rapid ventricular pacing suggested a concealed a left posteroseptal accessory pathway only demonstrated during high doses of isoproterenol.  The confirmation of this second accessory pathway could not be made because the patient did not have inducible SVT. 1. RF energy application:  There were no RF energy applications     delivered.  CONCLUSION:  This study demonstrates an EP study negative for inducible SVT despite the finding of manifest right lateral accessory pathway conduction, and possible left posteroseptal retrograde  accessory pathway conduction.  There was no evidence of any retrograde accessory pathway conduction on the right side, and no evidence of any antegrade accessory pathway conduction on the left side.  In addition, the patient's AV conduction was better over the accessory pathway then over the AV node. Finally, it demonstrated very poor retrograde AV node conduction with the AV node conduction only being demonstrated with fairly high  dose isoproterenol.     Doylene Canning. Ladona Ridgel, MD     GWT/MEDQ  D:  03/22/2012  T:  03/23/2012  Job:  [id number]  cc:   Gerrit Friends. Dietrich Pates, MD, [facility]

## 2012-03-23 NOTE — Progress Notes (Signed)
Utilization Review Completed.   Piero Mustard, RN, BSN Nurse Case Manager  [phone]  

## 2012-03-23 NOTE — Discharge Summary (Signed)
ELECTROPHYSIOLOGY PROCEDURE DISCHARGE SUMMARY    Patient ID: Paula Booker,  MRN: [id number], DOB/AGE: 66-21-1947 66 y.o.  Admit date: 03/22/2012 Discharge date: 03/23/2012  Primary Cardiologist: Everette Rank, MD Electrophysiologist: Lewayne Bunting, MD  Primary Discharge Diagnosis:  Syncope, non-ischemic cardiomyopathy, and WPW status post EPS and implantation of a dual chamber ICD this admission.  Secondary Discharge Diagnosis:  1.  Hypertension 2.  GERD  Procedures This Admission: 1.  Electrophysiology study on 03-22-2012.  This demonstrated an EP study negative for inducible SVT despite the finding of manifest right lateral accessory pathway conduction, and possible left posteroseptal retrograde accessory pathway conduction. There was no evidence of any retrograde accessory pathway  conduction on the right side, and no evidence of any antegrade accessory pathway conduction on the left side. In addition, the patient's AV conduction was better over the accessory pathway then over the AV node. Finally, it demonstrated very poor retrograde AV node conduction with the AV node conduction only being demonstrated with fairly high dose isoproterenol. 2.  Implantation of a dual chamber ICD on 03-22-2012.  The patient received a MDT Evera ICD with model number 5076 right atrial lead and model number 6935 right ventricular lead.  DFT's at time of implant were successful at 20J.  There were no early apparent complications. 3.  CXR on 03-23-2012 demonstrated no pneumothorax status post device implantation.  Brief HPI: Paula Booker was referred by Dr. 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 The patient notes that she has had palpitations for many years. She has never sought medical attention. The patient has had 3 syncopal episodes, but does not feel palpitations prior to her syncopal episode. She has noted that other episodes occur  without frank syncope. No other complaints. She is referred for consideration of an ICD as she has all of the above with an EF of 20%. Of note after left heart catheterization 6 months ago, she was placed on medical therapy with beta blockers and ARB drugs. Her repeat echo a few weeks ago demonstrated an EF of 20%.  It was recommended that the patient undergo EPS/RFCA and implantation of an ICD.  Risks, benefits, and alternatives were reviewed with the patient who wished to proceed.  Hospital Course:  The patient was admitted and underwent EPS with details as outlined above.  This was followed by implantation of a dual chamber ICD with details as outlined above.   She was monitored on telemetry overnight which demonstrated sinus rhythm with occasional PVC's.  Left chest was without hematoma or ecchymosis.  The device was interrogated and found to be functioning normally.  CXR was obtained and demonstrated no pneumothorax status post device implantation.  Wound care, arm mobility, and restrictions were reviewed with the patient.  Dr Ladona Ridgel examined the patient and considered them stable for discharge to home.    Discharge Vitals: Blood pressure 140/56, pulse 70, temperature 97.7 F (36.5 C), temperature source Oral, resp. rate 21, height 5\' 4"  (1.626 m), weight 203 lb 11.3 oz (92.4 kg), SpO2 99.00%.    Labs:   Lab Results  Component Value Date   WBC 6.3 03/15/2012   HGB 12.5 03/15/2012   HCT 38.1 03/15/2012   MCV 91.6 03/15/2012   PLT 227.0 03/15/2012   Discharge Medications:    Medication List     As of 03/23/2012 10:12 AM    TAKE these medications  albuterol 108 (90 BASE) MCG/ACT inhaler   Commonly known as: PROVENTIL HFA;VENTOLIN HFA   Inhale 2 puffs into the lungs every 6 (six) hours as needed. For wheezing      ALEVE 220 MG tablet   Generic drug: naproxen sodium   Take 440 mg by mouth daily.      carvedilol 12.5 MG tablet   Commonly known as: COREG   Take 25 mg by mouth 2  (two) times daily with a meal.      cetirizine 10 MG tablet   Commonly known as: ZYRTEC   Take 10 mg by mouth daily.      citalopram 20 MG tablet   Commonly known as: CELEXA   Take 20 mg by mouth daily.      Fluticasone-Salmeterol 250-50 MCG/DOSE Aepb   Commonly known as: ADVAIR   Inhale 1 puff into the lungs every 12 (twelve) hours.      furosemide 40 MG tablet   Commonly known as: LASIX   Take 40 mg by mouth daily.      GELATIN PO   Take 13 mg by mouth daily.      losartan 50 MG tablet   Commonly known as: COZAAR   Take 50 mg by mouth daily.      omeprazole 20 MG capsule   Commonly known as: PRILOSEC   Take 40 mg by mouth daily.      OSTEO BI-FLEX ADV DOUBLE ST PO   Take 2 tablets by mouth every morning.      potassium chloride 10 MEQ tablet   Commonly known as: K-DUR,KLOR-CON   Take 10 mEq by mouth 2 (two) times daily.      Vitamin D3 1000 UNITS Caps   Take 1 capsule by mouth daily.          Disposition:  Discharge Orders    Future Appointments: Provider: Department: Dept Phone: Center:   04/05/2012 10:30 AM [facility] Main Office [place]) [phone] [facility]   07/03/2012 2:15 PM Marinus Maw, MD [facility] Main Office [place]) [phone] [facility]     Future Orders Please Complete By Expires   Diet - low sodium heart healthy      Increase activity slowly      Discharge instructions      Comments:   Please see post ICD discharge instructions     Follow-up Information    Follow up with Lewayne Bunting, MD. On 07/03/2012. (At 2:15 PM)    Contact information:   [address] [place] [address] [phone]      Follow up with [facility] Main Office [facility]). On 04/05/2012. (At 10:30 AM for wound check)    Contact information:   [address] [place] [address] [phone]         Duration of Discharge Encounter: Greater than 30 minutes including  physician time.  Signed, Gypsy Balsam, RN, BSN 03/23/2012, 10:12 AM

## 2012-03-23 NOTE — Progress Notes (Signed)
   ELECTROPHYSIOLOGY ROUNDING NOTE    Patient Name: Paula Booker Date of Encounter: 03-23-2013    SUBJECTIVE:Patient feels well.  No chest pain or shortness of breath.  Moderate incisional soreness. S/p EPS (no inducible arrhythmias) and implantation of dual chamber ICD 03-22-2012  TELEMETRY: Reviewed telemetry pt in sinus rhythm with occasional PVC's Filed Vitals:   03/22/12 1800 03/22/12 1900 03/22/12 2000 03/23/12 0014  BP: 118/60 121/59 130/56 118/49  Pulse: 89  84 78  Temp:   98.7 F (37.1 C)   TempSrc:   Axillary   Resp: 17 15 17 13   Height:      Weight:    203 lb 11.3 oz (92.4 kg)  SpO2: 95%  97% 95%    Intake/Output Summary (Last 24 hours) at 03/23/12 0272 Last data filed at 03/22/12 2116  Gross per 24 hour  Intake      0 ml  Output    400 ml  Net   -400 ml      Radiology/Studies:  Final result pending, leads in stable position.  PHYSICAL EXAM Left chest without hematoma or ecchymosis.  Neck and groin incision without complication.   DEVICE INTERROGATION: Device interrogation pending  Wound care, arm mobility, restrictions, shock plan reviewed with patient.  Routine follow up scheduled with our office.  Pt has follow up scheduled with Dr Eldridge Dace (primary cards)  EP Attending   Patient seen and examined. Ok for discharge. Usual followup. CC to Dr. Eldridge Dace.  Leonia Reeves.D.

## 2012-03-23 NOTE — Op Note (Signed)
NAMEKATERA, RYBKA NO.:  [id number]  MEDICAL RECORD NO.:  [id number]  LOCATION:  6525                         FACILITY:  [facility]  PHYSICIAN:  Doylene Canning. Ladona Ridgel, MD    DATE OF BIRTH:  12-Sep-1945  DATE OF PROCEDURE:  03/22/2012 DATE OF DISCHARGE:                              OPERATIVE REPORT   PROCEDURE PERFORMED:  Insertion of a dual-chamber ICD.  INDICATION:  Nonischemic cardiomyopathy, chronic systolic heart failure class II, EF 30% despite maximal medical therapy with beta-blockers and ACE inhibitors for over 6 months.  INTRODUCTION:  The patient is a 66 year old woman with a history of recurrent syncope and chronic systolic heart failure, severe left ventricular dysfunction, ejection fraction 30% despite maximal medical therapy.  In addition, she has a history of palpitations and has manifest pre-excitation on her 12 lead EKG.  She has never had documented SVT but notes a long history of tachy palpitations.  She underwent electrophysiologic study and had no inducible SVT despite very clear-cut manifest pre-excitation of a right lateral free wall accessory pathway.  Catheter ablation was not attempted.  The patient is now referred for ICD implantation.  PROCEDURE:  After informed consent was obtained, the patient was taken to the diagnostic EP lab in a fasting state.  After usual preparation and draping, intravenous fentanyl and midazolam was given for sedation. A 30 mL of lidocaine was infiltrated into the left infraclavicular region.  A 7-cm incision was carried out over this region. Electrocautery was utilized to dissect down to the fascial plane.  Left subclavian vein was punctured x2.  The Medtronic model 6935, 58 cm active fixation single coil defibrillation lead, serial number [id number] was advanced into the right ventricle and the Medtronic model 5076, 45 cm active fixation pacing lead, serial number [id number] was advanced into the right atrium.   Mapping was carried out in the right ventricle and at the final site, the R-waves measured 8-10 mV.  R waves were low throughout the right ventricle.  The pace impedance with lead actively fixed with 1000 ohms and threshold of 1 V at 0.5 milliseconds. At the final site, there was a large injury current with active fixation of the lead.  With the right ventricular lead in satisfactory position, attention was then turned to placement of the atrial lead.  It was placed in anterolateral portion of the right atrium where P-waves measured 3-4 mV.  The pacing threshold was 0.5 V at 0.5 milliseconds and impedance 600 ohms with the lead actively fixed.  With the atrium and ventricle leads in satisfactory position, they were secured to the subpectoral fascia with a figure-of-eight silk suture and the sewing sleeve was secured with silk suture.  Electrocautery was utilized to make a subcutaneous pocket.  Antibiotic irrigation was utilized to irrigate the pocket.  Electrocautery was utilized to assure hemostasis. The Medtronic Evera XT DR dual-chamber ICD, serial number [id number] was connected to the atrial and the RV lead and placed back in the subcutaneous pocket and secured with silk suture.  The pocket was irrigated with antibiotic irrigation and the incision was closed with 2- 0 and 3-0 Vicryl.  At this point, I scrubbed out of  the case to supervise defibrillation threshold testing.  After the patient was more deeply sedated with fentanyl and Versed under my direct supervision, VF was induced with a T-wave shock.  A 20-joule shock was used to terminate ventricular fibrillation and restore sinus rhythm.  No additional defibrillation threshold testing was carried out. Benzoin and Steri-Strips were painted on the skin.  A pressure dressing was applied and the patient was returned to her room in satisfactory condition.  COMPLICATIONS:  There were no immediate procedure complications.  RESULTS:   This demonstrate successful dual-chamber ICD implantation in a patient with a nonischemic cardiomyopathy, syncope, and history of palpitations.  EF 30%.     Doylene Canning. Ladona Ridgel, MD     GWT/MEDQ  D:  03/22/2012  T:  03/23/2012  Job:  [id number]  cc:   Jonelle Sidle, MD

## 2012-03-27 ENCOUNTER — Telehealth: Payer: Self-pay | Admitting: Internal Medicine

## 2012-03-27 NOTE — Telephone Encounter (Signed)
Discussed with Dr Irish Lack to wash with antibacterial soap twice daily for the next 7 days.  Aware she just had ICD implant and unable to shower.  She will call us with any problems and keep her wound check appointment.

## 2012-03-27 NOTE — Telephone Encounter (Signed)
Pt's grandson may have Saul Fordyce and they will not know until Wednesday or Thursday and she wants to know if she needs to take antibiotics or anything due to her open wound. She did lay on the couch and went to sleep after her grandson was on the couch she did not even think about it

## 2012-03-29 ENCOUNTER — Emergency Department (HOSPITAL_COMMUNITY): Payer: Medicare Other

## 2012-03-29 ENCOUNTER — Emergency Department (HOSPITAL_COMMUNITY)
Admission: EM | Admit: 2012-03-29 | Discharge: 2012-03-29 | Disposition: A | Discharge status: Home / Self Care | Payer: Medicare Other | Attending: Emergency Medicine | Admitting: Emergency Medicine

## 2012-03-29 DIAGNOSIS — R079 Chest pain, unspecified: Secondary | ICD-10-CM

## 2012-03-29 DIAGNOSIS — Z8709 Personal history of other diseases of the respiratory system: Secondary | ICD-10-CM | POA: Insufficient documentation

## 2012-03-29 DIAGNOSIS — I509 Heart failure, unspecified: Secondary | ICD-10-CM | POA: Insufficient documentation

## 2012-03-29 DIAGNOSIS — Z85828 Personal history of other malignant neoplasm of skin: Secondary | ICD-10-CM | POA: Insufficient documentation

## 2012-03-29 DIAGNOSIS — Z8679 Personal history of other diseases of the circulatory system: Secondary | ICD-10-CM | POA: Insufficient documentation

## 2012-03-29 DIAGNOSIS — Z79899 Other long term (current) drug therapy: Secondary | ICD-10-CM | POA: Insufficient documentation

## 2012-03-29 DIAGNOSIS — Z95810 Presence of automatic (implantable) cardiac defibrillator: Secondary | ICD-10-CM | POA: Insufficient documentation

## 2012-03-29 DIAGNOSIS — J45909 Unspecified asthma, uncomplicated: Secondary | ICD-10-CM | POA: Insufficient documentation

## 2012-03-29 DIAGNOSIS — Z791 Long term (current) use of non-steroidal anti-inflammatories (NSAID): Secondary | ICD-10-CM | POA: Insufficient documentation

## 2012-03-29 DIAGNOSIS — M129 Arthropathy, unspecified: Secondary | ICD-10-CM | POA: Insufficient documentation

## 2012-03-29 DIAGNOSIS — I10 Essential (primary) hypertension: Secondary | ICD-10-CM | POA: Insufficient documentation

## 2012-03-29 DIAGNOSIS — R0789 Other chest pain: Secondary | ICD-10-CM | POA: Insufficient documentation

## 2012-03-29 DIAGNOSIS — K219 Gastro-esophageal reflux disease without esophagitis: Secondary | ICD-10-CM | POA: Insufficient documentation

## 2012-03-29 LAB — CBC WITH DIFFERENTIAL/PLATELET
Eosinophils Absolute: 0.5 10*3/uL (ref 0.0–0.7)
Hemoglobin: 12.4 g/dL (ref 12.0–15.0)
Lymphocytes Relative: 37 % (ref 12–46)
Lymphs Abs: 3.4 10*3/uL (ref 0.7–4.0)
MCH: 30.2 pg (ref 26.0–34.0)
MCV: 90.3 fL (ref 78.0–100.0)
Monocytes Relative: 8 % (ref 3–12)
Neutrophils Relative %: 50 % (ref 43–77)
Platelets: 201 10*3/uL (ref 150–400)
RBC: 4.11 MIL/uL (ref 3.87–5.11)
WBC: 9.2 10*3/uL (ref 4.0–10.5)

## 2012-03-29 LAB — COMPREHENSIVE METABOLIC PANEL
ALT: 16 U/L (ref 0–35)
Alkaline Phosphatase: 91 U/L (ref 39–117)
BUN: 23 mg/dL (ref 6–23)
CO2: 27 mEq/L (ref 19–32)
GFR calc Af Amer: >90 mL/min (ref >90)
GFR calc non Af Amer: 88 mL/min — ABNORMAL LOW (ref >90)
Glucose, Bld: 111 mg/dL — ABNORMAL HIGH (ref 70–99)
Potassium: 3.9 mEq/L (ref 3.5–5.1)
Sodium: 141 mEq/L (ref 135–145)
Total Bilirubin: 0.2 mg/dL — ABNORMAL LOW (ref 0.3–1.2)

## 2012-03-29 LAB — TROPONIN I: Troponin I: <0.3 ng/mL (ref <0.30)

## 2012-03-29 MED ORDER — NITROGLYCERIN 0.4 MG SL SUBL
0.4000 mg | SUBLINGUAL_TABLET | SUBLINGUAL | Status: DC | PRN
  Filled 2012-03-29: qty 25

## 2012-03-29 NOTE — ED Notes (Signed)
MD Ray at bedside. 

## 2012-03-29 NOTE — ED Notes (Signed)
Patient has a surgical incision to her left anterior chest for defib insertion 2 wks ago. Steri strips in place, no drainage present.

## 2012-03-29 NOTE — ED Provider Notes (Addendum)
History     CSN: [id number]  Arrival date & time 03/29/12  1608   First MD Initiated Contact with Patient 03/29/12 1612      No chief complaint on file.   (Consider location/radiation/quality/duration/timing/severity/associated sxs/prior treatment) HPI Patient began having sscp began abruptly at 3p.m 10/10 radiating through to back sharp in nature.  Pain lasted about 10 minutes, easing off when ems gave nitro then reduced to 2/10 now.  Aspirin given by ems.  No associated symptoms.  Patient had some headache prior to chest pain.  Denies dyspnea, sweating, nausea, vomiting.  Patient had defibrillator placed last Thursday.  Patient states she has ef 20%.  Card- Dr. Isabel Caprice, [place] Ridgel.   Past Medical History  Diagnosis Date  . Asthma   . GERD (gastroesophageal reflux disease)   . CHF (congestive heart failure)   . Hypertension   . ICD (implantable cardiac defibrillator) in place   . Skin cancer 1999  . Shortness of breath     "@ any time I can get SOB" (03/22/2012)  . Migraines   . Arthritis     "hands and knees" (03/22/2012)    Past Surgical History  Procedure Date  . Cardiac defibrillator placement 03/22/2012    DDD  . Cholecystectomy ~ 2003  . Tonsillectomy and adenoidectomy 1950  . Skin cancer excision 1999    "top of my head and my right back shoulder"  . Mohs surgery 06/2011    "forehead" (03/22/2012)  . Supraventricular tachycardia ablation 03/22/2012    unable to induce VT/RN report 03/22/2012    No family history on file.  History  Substance Use Topics  . Smoking status: Never Smoker   . Smokeless tobacco: Never Used  . Alcohol Use: Yes     Comment: 03/22/2012 "mixed drink or beer q 2-3 months or so"    OB History    Grav Para Term Preterm Abortions TAB SAB Ect Mult Living                  Review of Systems  Constitutional: Negative for fever, chills, activity change, appetite change and unexpected weight change.  HENT: Negative for sore throat,  rhinorrhea, neck pain, neck stiffness and sinus pressure.   Eyes: Negative for visual disturbance.  Respiratory: Negative for cough and shortness of breath.   Cardiovascular: Positive for chest pain. Negative for leg swelling.  Gastrointestinal: Negative for vomiting, abdominal pain, diarrhea and blood in stool.  Genitourinary: Negative for dysuria, urgency, frequency, vaginal discharge and difficulty urinating.  Musculoskeletal: Negative for myalgias, arthralgias and gait problem.  Skin: Negative for color change and rash.  Neurological: Negative for weakness, light-headedness and headaches.  Hematological: Does not bruise/bleed easily.  Psychiatric/Behavioral: Negative for dysphoric mood.    Allergies  Penicillins; Sulfonamide derivatives; and Ivp dye  Home Medications   Current Outpatient Rx  Name  Route  Sig  Dispense  Refill  . ALBUTEROL SULFATE HFA 108 (90 BASE) MCG/ACT IN AERS   Inhalation   Inhale 2 puffs into the lungs every 6 (six) hours as needed. For wheezing         . CARVEDILOL 12.5 MG PO TABS   Oral   Take 25 mg by mouth 2 (two) times daily with a meal.         . CETIRIZINE HCL 10 MG PO TABS   Oral   Take 10 mg by mouth daily.         Marland Kitchen VITAMIN D3 1000 UNITS PO CAPS  Oral   Take 1 capsule by mouth daily.         Marland Kitchen CITALOPRAM HYDROBROMIDE 20 MG PO TABS   Oral   Take 20 mg by mouth daily.         Marland Kitchen FLUTICASONE-SALMETEROL 250-50 MCG/DOSE IN AEPB   Inhalation   Inhale 1 puff into the lungs every 12 (twelve) hours.         . FUROSEMIDE 40 MG PO TABS   Oral   Take 40 mg by mouth daily.         Marland Kitchen GELATIN PO   Oral   Take 13 mg by mouth daily.         Marland Kitchen LOSARTAN POTASSIUM 50 MG PO TABS   Oral   Take 50 mg by mouth daily.         . OSTEO BI-FLEX ADV DOUBLE ST PO   Oral   Take 2 tablets by mouth every morning.         Marland Kitchen NAPROXEN SODIUM 220 MG PO TABS   Oral   Take 440 mg by mouth daily.         Marland Kitchen OMEPRAZOLE 20 MG PO CPDR    Oral   Take 40 mg by mouth daily.         Marland Kitchen POTASSIUM CHLORIDE CRYS ER 10 MEQ PO TBCR   Oral   Take 10 mEq by mouth 2 (two) times daily.           There were no vitals taken for this visit.  Physical Exam  Nursing note and vitals reviewed. Constitutional: She is oriented to person, place, and time. She appears well-developed and well-nourished.  HENT:  Head: Normocephalic and atraumatic.  Eyes: Conjunctivae normal and EOM are normal. Pupils are equal, round, and reactive to light.  Neck: Normal range of motion. Neck supple. No thyromegaly present.  Cardiovascular: Normal rate and regular rhythm.   Pulmonary/Chest: Effort normal and breath sounds normal.  Abdominal: Soft.  Musculoskeletal: Normal range of motion.  Neurological: She is alert and oriented to person, place, and time.  Skin: Skin is warm and dry.  Psychiatric: She has a normal mood and affect.    ED Course  Procedures (including critical care time)  Labs Reviewed - No data to display No results found.   No diagnosis found. Results for orders placed during the hospital encounter of 03/29/12  CBC WITH DIFFERENTIAL      Component Value Range   WBC 9.2  4.0 - 10.5 K/uL   RBC 4.11  3.87 - 5.11 MIL/uL   Hemoglobin 12.4  12.0 - 15.0 g/dL   HCT 16.1  09.6 - 04.5 %   MCV 90.3  78.0 - 100.0 fL   MCH 30.2  26.0 - 34.0 pg   MCHC 33.4  30.0 - 36.0 g/dL   RDW 40.9  81.1 - 91.4 %   Platelets 201  150 - 400 K/uL   Neutrophils Relative 50  43 - 77 %   Neutro Abs 4.6  1.7 - 7.7 K/uL   Lymphocytes Relative 37  12 - 46 %   Lymphs Abs 3.4  0.7 - 4.0 K/uL   Monocytes Relative 8  3 - 12 %   Monocytes Absolute 0.7  0.1 - 1.0 K/uL   Eosinophils Relative 5  0 - 5 %   Eosinophils Absolute 0.5  0.0 - 0.7 K/uL   Basophils Relative 0  0 - 1 %   Basophils Absolute 0.0  0.0 - 0.1 K/uL  COMPREHENSIVE METABOLIC PANEL      Component Value Range   Sodium 141  135 - 145 mEq/L   Potassium 3.9  3.5 - 5.1 mEq/L   Chloride 103  96  - 112 mEq/L   CO2 27  19 - 32 mEq/L   Glucose, Bld 111 (*) 70 - 99 mg/dL   BUN 23  6 - 23 mg/dL   Creatinine, Ser 1.61  0.50 - 1.10 mg/dL   Calcium 9.5  8.4 - 09.6 mg/dL   Total Protein 6.6  6.0 - 8.3 g/dL   Albumin 3.6  3.5 - 5.2 g/dL   AST 20  0 - 37 U/L   ALT 16  0 - 35 U/L   Alkaline Phosphatase 91  39 - 117 U/L   Total Bilirubin 0.2 (*) 0.3 - 1.2 mg/dL   GFR calc non Af Amer 88 (*) >90 mL/min   GFR calc Af Amer >90  >90 mL/min  TROPONIN I      Component Value Range   Troponin I <0.30  <0.30 ng/mL  PROTIME-INR      Component Value Range   Prothrombin Time 12.0  11.6 - 15.2 seconds   INR 0.89  0.00 - 1.49  D-DIMER, QUANTITATIVE      Component Value Range   D-Dimer, Quant 1.39 (*) 0.00 - 0.48 ug/mL-FEU  TROPONIN I      Component Value Range   Troponin I <0.30  <0.30 ng/mL  TROPONIN I      Component Value Range   Troponin I <0.30  <0.30 ng/mL   [employer] interrogation of an internal defibrillator done that did not show any evidence of arrhythmia or shock. Patient remained pain free here. She does have some nonspecific changes on her EKG. She has normal troponins x3. I discussed the patient's care with Dr. Donnie Aho. The patient had a cardiac catheterization done in June that showed no evidence of coronary artery disease. The patient is advised to followup with her cardiologist in the next one to 4 days. She's advised return if worsening time. Patient has nonspecific chest pain but no evidence of ischemia and known normal coronary arteries 6 months ago.       Hilario Quarry, MD 03/29/12 0454  Hilario Quarry, MD 05/07/12 [id number]

## 2012-03-29 NOTE — ED Notes (Signed)
Patient's chest pain went from a 2 to a 3 post SL Nitro.

## 2012-03-29 NOTE — ED Notes (Signed)
Per EMS: Pt from home. Pt reports  Sudden onset mid sternal sharp CP radiating to back around 1500.  PT denies N/V/SOB. 1 nitro given enroute relieving CP to 1/10 pain. 325mg  aspirin.  20 G L AC. Unremarkable 12 L; NSR.Pt had defibrillator placed last week. PT AO x4.

## 2012-04-05 ENCOUNTER — Ambulatory Visit (INDEPENDENT_AMBULATORY_CARE_PROVIDER_SITE_OTHER): Payer: Medicare Other | Admitting: *Deleted

## 2012-04-05 DIAGNOSIS — I456 Pre-excitation syndrome: Secondary | ICD-10-CM

## 2012-04-05 DIAGNOSIS — I5022 Chronic systolic (congestive) heart failure: Secondary | ICD-10-CM

## 2012-04-05 DIAGNOSIS — R55 Syncope and collapse: Secondary | ICD-10-CM

## 2012-04-05 DIAGNOSIS — I509 Heart failure, unspecified: Secondary | ICD-10-CM

## 2012-04-05 LAB — ICD DEVICE OBSERVATION
AL AMPLITUDE: 5.1 mv
AL THRESHOLD: 0.5 V
DEV-0020ICD: NEGATIVE
HV IMPEDENCE: 59 Ohm
RV LEAD AMPLITUDE: 9.1 mv
RV LEAD THRESHOLD: 0.5 V

## 2012-04-05 NOTE — Progress Notes (Signed)
Wound check-ICD 

## 2012-04-17 ENCOUNTER — Telehealth: Payer: Self-pay | Admitting: Internal Medicine

## 2012-04-17 NOTE — Telephone Encounter (Signed)
error 

## 2012-04-20 ENCOUNTER — Emergency Department (HOSPITAL_COMMUNITY)
Admission: EM | Admit: 2012-04-20 | Discharge: 2012-04-21 | Disposition: A | Discharge status: Home / Self Care | Payer: Medicare Other | Attending: Emergency Medicine | Admitting: Emergency Medicine

## 2012-04-20 ENCOUNTER — Encounter (HOSPITAL_COMMUNITY): Payer: Self-pay | Admitting: Family Medicine

## 2012-04-20 DIAGNOSIS — R091 Pleurisy: Secondary | ICD-10-CM | POA: Insufficient documentation

## 2012-04-20 DIAGNOSIS — Z8679 Personal history of other diseases of the circulatory system: Secondary | ICD-10-CM | POA: Insufficient documentation

## 2012-04-20 DIAGNOSIS — IMO0002 Reserved for concepts with insufficient information to code with codable children: Secondary | ICD-10-CM | POA: Insufficient documentation

## 2012-04-20 DIAGNOSIS — I509 Heart failure, unspecified: Secondary | ICD-10-CM | POA: Insufficient documentation

## 2012-04-20 DIAGNOSIS — Z79899 Other long term (current) drug therapy: Secondary | ICD-10-CM | POA: Insufficient documentation

## 2012-04-20 DIAGNOSIS — Z7982 Long term (current) use of aspirin: Secondary | ICD-10-CM | POA: Insufficient documentation

## 2012-04-20 DIAGNOSIS — I10 Essential (primary) hypertension: Secondary | ICD-10-CM | POA: Insufficient documentation

## 2012-04-20 DIAGNOSIS — Z85828 Personal history of other malignant neoplasm of skin: Secondary | ICD-10-CM | POA: Insufficient documentation

## 2012-04-20 DIAGNOSIS — J45909 Unspecified asthma, uncomplicated: Secondary | ICD-10-CM | POA: Insufficient documentation

## 2012-04-20 DIAGNOSIS — Z95810 Presence of automatic (implantable) cardiac defibrillator: Secondary | ICD-10-CM | POA: Insufficient documentation

## 2012-04-20 DIAGNOSIS — K219 Gastro-esophageal reflux disease without esophagitis: Secondary | ICD-10-CM | POA: Insufficient documentation

## 2012-04-20 DIAGNOSIS — Z8739 Personal history of other diseases of the musculoskeletal system and connective tissue: Secondary | ICD-10-CM | POA: Insufficient documentation

## 2012-04-20 MED ORDER — LORAZEPAM 2 MG/ML IJ SOLN
0.5000 mg | Freq: Once | INTRAMUSCULAR | Status: AC
  Administered 2012-04-20: 0.5 mg via INTRAVENOUS
  Filled 2012-04-20: qty 1

## 2012-04-20 NOTE — Progress Notes (Signed)
RN called stating that pt came from Dr. Norris Cross office with report of positive D dimer and normal Cr and pt with SOB.  She was sent here for V/Q scan and it is up to ordering physician (Dr. Mayford Knife) to call in nuc med team for the study.  At this time, there is no documentation of labs or clinic note from Dr. Mayford Knife re: plan for this.  Recommended to them to evaluate the pt like we would any pt that comes in for SOB.  I am the oncall physician for [facility]  - I do not feel comfortable calling in nuc med team to do a study without the pt undergoing evaluation here, especially given no evidence of lab values done at the other office.  There is no way to confirm what was done today and what the plan was.  Pt needs an evaluation today for SOB and ED can decide what testing needs to be done to investigate her sx.

## 2012-04-20 NOTE — ED Notes (Signed)
EDP at bedside  

## 2012-04-20 NOTE — ED Notes (Addendum)
Pt sent from PCP office for elevated d-dimer. Pt is SOB. Pt speaking in complete sentences but has dyspnea at rest. Pt denies CP.

## 2012-04-20 NOTE — ED Provider Notes (Addendum)
History     CSN: [id number]  Arrival date & time 04/20/12  2047   First MD Initiated Contact with Patient 04/20/12 2105      Chief Complaint  Patient presents with  . Shortness of Breath    (Consider location/radiation/quality/duration/timing/severity/associated sxs/prior treatment) Patient is a 67 y.o. female presenting with shortness of breath. The history is provided by the patient.  Shortness of Breath  Associated symptoms include shortness of breath. Pertinent negatives include no chest pain, no fever and no cough.  pt notes feeling sob for the past month. Constant. No acute or abrupt change today. No specific exacerbating or alleviating factors. No change w position. No orthopnea or pnd. No cough or fever. No chest pain. States was seen for same approximately 1 month ago, but no definitive dx made, states was told everything was fine. No hx dvt or pe. No leg pain or swelling. Compliant w meds, no recent change in meds. States had defibrillator placed 1 month ago for hx wpw, states no shock since placement. No fainting episodes.     Past Medical History  Diagnosis Date  . Asthma   . GERD (gastroesophageal reflux disease)   . CHF (congestive heart failure)   . Hypertension   . ICD (implantable cardiac defibrillator) in place   . Skin cancer 1999  . Shortness of breath     "@ any time I can get SOB" (03/22/2012)  . Migraines   . Arthritis     "hands and knees" (03/22/2012)    Past Surgical History  Procedure Date  . Cardiac defibrillator placement 03/22/2012    DDD  . Cholecystectomy ~ 2003  . Tonsillectomy and adenoidectomy 1950  . Skin cancer excision 1999    "top of my head and my right back shoulder"  . Mohs surgery 06/2011    "forehead" (03/22/2012)  . Supraventricular tachycardia ablation 03/22/2012    unable to induce VT/RN report 03/22/2012    No family history on file.  History  Substance Use Topics  . Smoking status: Never Smoker   . Smokeless  tobacco: Never Used  . Alcohol Use: Yes     Comment: 03/22/2012 "mixed drink or beer q 2-3 months or so"    OB History    Grav Para Term Preterm Abortions TAB SAB Ect Mult Living                  Review of Systems  Constitutional: Negative for fever and chills.  HENT: Negative for neck pain.   Eyes: Negative for redness.  Respiratory: Positive for shortness of breath. Negative for cough.   Cardiovascular: Negative for chest pain, palpitations and leg swelling.  Gastrointestinal: Negative for abdominal pain.  Genitourinary: Negative for flank pain.  Musculoskeletal: Negative for back pain.  Skin: Negative for rash.  Neurological: Negative for headaches.  Hematological: Does not bruise/bleed easily.  Psychiatric/Behavioral: Negative for confusion.    Allergies  Penicillins; Sulfonamide derivatives; and Ivp dye  Home Medications   Current Outpatient Rx  Name  Route  Sig  Dispense  Refill  . ALBUTEROL SULFATE HFA 108 (90 BASE) MCG/ACT IN AERS   Inhalation   Inhale 2 puffs into the lungs every 6 (six) hours as needed. For wheezing         . ASPIRIN EC 325 MG PO TBEC   Oral   Take 325 mg by mouth daily.         Marland Kitchen CARVEDILOL 12.5 MG PO TABS   Oral  Take 12.5 mg by mouth 2 (two) times daily with a meal.          . CETIRIZINE HCL 10 MG PO TABS   Oral   Take 10 mg by mouth daily.         Marland Kitchen VITAMIN D3 1000 UNITS PO CAPS   Oral   Take 1 capsule by mouth daily.         Marland Kitchen CITALOPRAM HYDROBROMIDE 20 MG PO TABS   Oral   Take 20 mg by mouth daily.         Marland Kitchen FLUTICASONE-SALMETEROL 250-50 MCG/DOSE IN AEPB   Inhalation   Inhale 1 puff into the lungs every 12 (twelve) hours.         . FUROSEMIDE 40 MG PO TABS   Oral   Take 40 mg by mouth daily.         Marland Kitchen GELATIN PO   Oral   Take 13 mg by mouth daily.         Marland Kitchen LOSARTAN POTASSIUM 50 MG PO TABS   Oral   Take 50 mg by mouth daily.         . OSTEO BI-FLEX ADV DOUBLE ST PO   Oral   Take 2 tablets  by mouth every morning.         . MULTIVITAMIN PO   Oral   Take 1 tablet by mouth daily.         Marland Kitchen NAPROXEN SODIUM 220 MG PO TABS   Oral   Take 440 mg by mouth daily.         Marland Kitchen OMEPRAZOLE 20 MG PO CPDR   Oral   Take 40 mg by mouth daily.         Marland Kitchen POTASSIUM CHLORIDE CRYS ER 10 MEQ PO TBCR   Oral   Take 10 mEq by mouth 2 (two) times daily.           BP 144/62  Pulse 93  Temp 97.9 F (36.6 C)  Resp 13  SpO2 95%  Physical Exam  Nursing note and vitals reviewed. Constitutional: She appears well-developed and well-nourished. No distress.  HENT:  Mouth/Throat: Oropharynx is clear and moist.  Eyes: Conjunctivae normal are normal. No scleral icterus.  Neck: Neck supple. No JVD present. No tracheal deviation present. No thyromegaly present.  Cardiovascular: Normal rate, regular rhythm, normal heart sounds and intact distal pulses.   Pulmonary/Chest: Effort normal and breath sounds normal. No respiratory distress.  Abdominal: Soft. Normal appearance and bowel sounds are normal. She exhibits no distension. There is no tenderness.  Musculoskeletal: She exhibits no edema and no tenderness.  Neurological: She is alert.  Skin: Skin is warm and dry. No rash noted.  Psychiatric:       anxious    ED Course  Procedures (including critical care time)     MDM  Dr Mayford Knife had called charge rn with labs from today, creatinine normal, elevated ddimer, and she had ordered vq scan.  Reviewed nursing notes and prior charts for additional history.   Recheck vq scan still pending.  Signed out to oncoming EDP, Dr Nicanor Alcon that pts cardiology service had seem today and done cxr, labs, and had instructed to come to ED for VQ scan which remains pending.    Called radiologist re delay in obtaining vq - he indicates he will make sure tech is called in.       Suzi Roots, MD 04/20/12 2321  Suzi Roots,  MD 04/20/12 2330

## 2012-04-20 NOTE — ED Notes (Signed)
Gave report to [facility] from Nuclear Medicine.

## 2012-04-20 NOTE — ED Notes (Addendum)
Verlon Au RN spoke with Paula Booker's PCP and obtained these results:  Creatinine 0.89 and BUN 17 drawn and resulted today 04/20/12.

## 2012-04-21 ENCOUNTER — Emergency Department (HOSPITAL_COMMUNITY): Payer: Medicare Other

## 2012-04-21 MED ORDER — XENON XE 133 GAS
20.0000 | GAS_FOR_INHALATION | Freq: Once | RESPIRATORY_TRACT | Status: AC | PRN
  Administered 2012-04-21: 16 via RESPIRATORY_TRACT

## 2012-04-21 MED ORDER — TECHNETIUM TO 99M ALBUMIN AGGREGATED
3.0000 | Freq: Once | INTRAVENOUS | Status: AC | PRN
  Administered 2012-04-21: 3 via INTRAVENOUS

## 2012-04-21 NOTE — ED Notes (Signed)
Pt returned from Nuclear Medicine

## 2012-04-23 ENCOUNTER — Inpatient Hospital Stay (HOSPITAL_COMMUNITY)
Admission: EM | Admit: 2012-04-23 | Discharge: 2012-04-26 | DRG: 204 | Disposition: A | Discharge status: Home / Self Care | Payer: Medicare Other | Attending: Internal Medicine | Admitting: Internal Medicine

## 2012-04-23 ENCOUNTER — Encounter (HOSPITAL_COMMUNITY): Payer: Self-pay

## 2012-04-23 ENCOUNTER — Encounter (HOSPITAL_COMMUNITY): Payer: Self-pay | Admitting: *Deleted

## 2012-04-23 ENCOUNTER — Emergency Department (INDEPENDENT_AMBULATORY_CARE_PROVIDER_SITE_OTHER)
Admission: EM | Admit: 2012-04-23 | Discharge: 2012-04-23 | Disposition: A | Discharge status: Other Acute Inpatient Hospital | Payer: Medicare Other | Source: Home / Self Care | Attending: Emergency Medicine | Admitting: Emergency Medicine

## 2012-04-23 ENCOUNTER — Emergency Department (HOSPITAL_COMMUNITY): Payer: Medicare Other

## 2012-04-23 DIAGNOSIS — R079 Chest pain, unspecified: Secondary | ICD-10-CM

## 2012-04-23 DIAGNOSIS — J40 Bronchitis, not specified as acute or chronic: Secondary | ICD-10-CM

## 2012-04-23 DIAGNOSIS — Z880 Allergy status to penicillin: Secondary | ICD-10-CM

## 2012-04-23 DIAGNOSIS — I509 Heart failure, unspecified: Secondary | ICD-10-CM | POA: Diagnosis present

## 2012-04-23 DIAGNOSIS — J45909 Unspecified asthma, uncomplicated: Secondary | ICD-10-CM

## 2012-04-23 DIAGNOSIS — F32A Depression, unspecified: Secondary | ICD-10-CM

## 2012-04-23 DIAGNOSIS — K219 Gastro-esophageal reflux disease without esophagitis: Secondary | ICD-10-CM

## 2012-04-23 DIAGNOSIS — I456 Pre-excitation syndrome: Secondary | ICD-10-CM

## 2012-04-23 DIAGNOSIS — Z91041 Radiographic dye allergy status: Secondary | ICD-10-CM

## 2012-04-23 DIAGNOSIS — F341 Dysthymic disorder: Secondary | ICD-10-CM | POA: Diagnosis present

## 2012-04-23 DIAGNOSIS — R0989 Other specified symptoms and signs involving the circulatory and respiratory systems: Principal | ICD-10-CM | POA: Diagnosis present

## 2012-04-23 DIAGNOSIS — R55 Syncope and collapse: Secondary | ICD-10-CM

## 2012-04-23 DIAGNOSIS — I10 Essential (primary) hypertension: Secondary | ICD-10-CM

## 2012-04-23 DIAGNOSIS — IMO0002 Reserved for concepts with insufficient information to code with codable children: Secondary | ICD-10-CM

## 2012-04-23 DIAGNOSIS — I5023 Acute on chronic systolic (congestive) heart failure: Secondary | ICD-10-CM

## 2012-04-23 DIAGNOSIS — I5022 Chronic systolic (congestive) heart failure: Secondary | ICD-10-CM

## 2012-04-23 DIAGNOSIS — Z95810 Presence of automatic (implantable) cardiac defibrillator: Secondary | ICD-10-CM

## 2012-04-23 DIAGNOSIS — R0601 Orthopnea: Secondary | ICD-10-CM

## 2012-04-23 DIAGNOSIS — R0609 Other forms of dyspnea: Principal | ICD-10-CM | POA: Diagnosis present

## 2012-04-23 DIAGNOSIS — Z882 Allergy status to sulfonamides status: Secondary | ICD-10-CM

## 2012-04-23 DIAGNOSIS — F329 Major depressive disorder, single episode, unspecified: Secondary | ICD-10-CM

## 2012-04-23 DIAGNOSIS — R0602 Shortness of breath: Secondary | ICD-10-CM | POA: Diagnosis present

## 2012-04-23 DIAGNOSIS — Z79899 Other long term (current) drug therapy: Secondary | ICD-10-CM

## 2012-04-23 DIAGNOSIS — I428 Other cardiomyopathies: Secondary | ICD-10-CM | POA: Diagnosis present

## 2012-04-23 DIAGNOSIS — J4530 Mild persistent asthma, uncomplicated: Secondary | ICD-10-CM | POA: Diagnosis present

## 2012-04-23 DIAGNOSIS — R0600 Dyspnea, unspecified: Secondary | ICD-10-CM

## 2012-04-23 DIAGNOSIS — J45901 Unspecified asthma with (acute) exacerbation: Secondary | ICD-10-CM

## 2012-04-23 LAB — CBC WITH DIFFERENTIAL/PLATELET
Basophils Absolute: 0 10*3/uL (ref 0.0–0.1)
Eosinophils Relative: 4 % (ref 0–5)
HCT: 36.3 % (ref 36.0–46.0)
Lymphocytes Relative: 35 % (ref 12–46)
Lymphs Abs: 3.2 10*3/uL (ref 0.7–4.0)
MCV: 90.3 fL (ref 78.0–100.0)
Neutro Abs: 4.8 10*3/uL (ref 1.7–7.7)
Platelets: 223 10*3/uL (ref 150–400)
RBC: 4.02 MIL/uL (ref 3.87–5.11)
WBC: 9.1 10*3/uL (ref 4.0–10.5)

## 2012-04-23 LAB — COMPREHENSIVE METABOLIC PANEL
ALT: 15 U/L (ref 0–35)
AST: 22 U/L (ref 0–37)
Alkaline Phosphatase: 98 U/L (ref 39–117)
CO2: 24 mEq/L (ref 19–32)
Calcium: 9.1 mg/dL (ref 8.4–10.5)
Chloride: 107 mEq/L (ref 96–112)
GFR calc Af Amer: >90 mL/min (ref >90)
GFR calc non Af Amer: >90 mL/min (ref >90)
Glucose, Bld: 112 mg/dL — ABNORMAL HIGH (ref 70–99)
Sodium: 142 mEq/L (ref 135–145)
Total Bilirubin: 0.2 mg/dL — ABNORMAL LOW (ref 0.3–1.2)

## 2012-04-23 LAB — PROTIME-INR: Prothrombin Time: 13.1 seconds (ref 11.6–15.2)

## 2012-04-23 MED ORDER — ASPIRIN 81 MG PO CHEW
324.0000 mg | CHEWABLE_TABLET | Freq: Once | ORAL | Status: AC
  Administered 2012-04-23: 324 mg via ORAL

## 2012-04-23 MED ORDER — VITAMIN D 1000 UNITS PO TABS
1000.0000 [IU] | ORAL_TABLET | Freq: Every day | ORAL | Status: DC
  Administered 2012-04-24 – 2012-04-26 (×3): 1000 [IU] via ORAL
  Filled 2012-04-23 (×3): qty 1

## 2012-04-23 MED ORDER — SODIUM CHLORIDE 0.9 % IV SOLN: Freq: Once | INTRAVENOUS | Status: DC

## 2012-04-23 MED ORDER — METHYLPREDNISOLONE SODIUM SUCC 125 MG IJ SOLR
125.0000 mg | Freq: Once | INTRAMUSCULAR | Status: AC
  Administered 2012-04-23: 125 mg via INTRAVENOUS
  Filled 2012-04-23 (×2): qty 2

## 2012-04-23 MED ORDER — LOSARTAN POTASSIUM 50 MG PO TABS
50.0000 mg | ORAL_TABLET | Freq: Every day | ORAL | Status: DC
  Administered 2012-04-24 – 2012-04-26 (×3): 50 mg via ORAL
  Filled 2012-04-23 (×3): qty 1

## 2012-04-23 MED ORDER — ALBUTEROL SULFATE (5 MG/ML) 0.5% IN NEBU: 2.5000 mg | INHALATION_SOLUTION | RESPIRATORY_TRACT | Status: DC | PRN

## 2012-04-23 MED ORDER — ALBUTEROL SULFATE (5 MG/ML) 0.5% IN NEBU
10.0000 mg | INHALATION_SOLUTION | Freq: Once | RESPIRATORY_TRACT | Status: AC
  Administered 2012-04-23: 10 mg via RESPIRATORY_TRACT
  Filled 2012-04-23: qty 0.5
  Filled 2012-04-23: qty 2
  Filled 2012-04-23: qty 20

## 2012-04-23 MED ORDER — NAPROXEN 250 MG PO TABS
250.0000 mg | ORAL_TABLET | Freq: Two times a day (BID) | ORAL | Status: DC
  Administered 2012-04-24 – 2012-04-25 (×3): 250 mg via ORAL
  Filled 2012-04-23 (×6): qty 1

## 2012-04-23 MED ORDER — GUAIFENESIN-DM 100-10 MG/5ML PO SYRP: 5.0000 mL | ORAL_SOLUTION | ORAL | Status: DC | PRN

## 2012-04-23 MED ORDER — LORATADINE 10 MG PO TABS
10.0000 mg | ORAL_TABLET | Freq: Every day | ORAL | Status: DC
  Administered 2012-04-24 – 2012-04-26 (×3): 10 mg via ORAL
  Filled 2012-04-23 (×3): qty 1

## 2012-04-23 MED ORDER — NAPROXEN SODIUM 220 MG PO TABS: 220.0000 mg | ORAL_TABLET | Freq: Two times a day (BID) | ORAL | Status: DC

## 2012-04-23 MED ORDER — POTASSIUM CHLORIDE CRYS ER 10 MEQ PO TBCR
10.0000 meq | EXTENDED_RELEASE_TABLET | Freq: Two times a day (BID) | ORAL | Status: DC
  Administered 2012-04-24 – 2012-04-26 (×6): 10 meq via ORAL
  Filled 2012-04-23 (×8): qty 1

## 2012-04-23 MED ORDER — SODIUM CHLORIDE 0.9 % IJ SOLN
3.0000 mL | Freq: Two times a day (BID) | INTRAMUSCULAR | Status: DC
  Administered 2012-04-24 – 2012-04-26 (×6): 3 mL via INTRAVENOUS

## 2012-04-23 MED ORDER — NITROGLYCERIN 0.4 MG SL SUBL
SUBLINGUAL_TABLET | SUBLINGUAL | Status: AC
  Filled 2012-04-23: qty 25

## 2012-04-23 MED ORDER — CARVEDILOL 12.5 MG PO TABS
12.5000 mg | ORAL_TABLET | Freq: Two times a day (BID) | ORAL | Status: DC
  Administered 2012-04-24 – 2012-04-26 (×5): 12.5 mg via ORAL
  Filled 2012-04-23 (×7): qty 1

## 2012-04-23 MED ORDER — NITROGLYCERIN 0.4 MG SL SUBL
0.4000 mg | SUBLINGUAL_TABLET | SUBLINGUAL | Status: DC | PRN
  Administered 2012-04-23: 0.4 mg via SUBLINGUAL

## 2012-04-23 MED ORDER — ACETAMINOPHEN 325 MG PO TABS
650.0000 mg | ORAL_TABLET | Freq: Four times a day (QID) | ORAL | Status: DC | PRN
  Administered 2012-04-24 (×2): 650 mg via ORAL
  Filled 2012-04-23 (×2): qty 2

## 2012-04-23 MED ORDER — CITALOPRAM HYDROBROMIDE 20 MG PO TABS
20.0000 mg | ORAL_TABLET | Freq: Every day | ORAL | Status: DC
  Administered 2012-04-24 – 2012-04-26 (×3): 20 mg via ORAL
  Filled 2012-04-23 (×3): qty 1

## 2012-04-23 MED ORDER — PANTOPRAZOLE SODIUM 40 MG PO TBEC
40.0000 mg | DELAYED_RELEASE_TABLET | Freq: Every day | ORAL | Status: DC
  Administered 2012-04-24 – 2012-04-26 (×3): 40 mg via ORAL
  Filled 2012-04-23 (×3): qty 1

## 2012-04-23 MED ORDER — ASPIRIN 81 MG PO CHEW
CHEWABLE_TABLET | ORAL | Status: AC
  Filled 2012-04-23: qty 4

## 2012-04-23 MED ORDER — FUROSEMIDE 10 MG/ML IJ SOLN
40.0000 mg | Freq: Two times a day (BID) | INTRAMUSCULAR | Status: DC
  Administered 2012-04-24 (×2): 40 mg via INTRAVENOUS
  Filled 2012-04-23 (×3): qty 4

## 2012-04-23 MED ORDER — ALBUTEROL SULFATE (5 MG/ML) 0.5% IN NEBU: 2.5000 mg | INHALATION_SOLUTION | Freq: Four times a day (QID) | RESPIRATORY_TRACT | Status: DC

## 2012-04-23 MED ORDER — ENOXAPARIN SODIUM 40 MG/0.4ML ~~LOC~~ SOLN
40.0000 mg | SUBCUTANEOUS | Status: DC
  Administered 2012-04-24 – 2012-04-26 (×3): 40 mg via SUBCUTANEOUS
  Filled 2012-04-23 (×3): qty 0.4

## 2012-04-23 MED ORDER — ACETAMINOPHEN 650 MG RE SUPP: 650.0000 mg | Freq: Four times a day (QID) | RECTAL | Status: DC | PRN

## 2012-04-23 MED ORDER — METHYLPREDNISOLONE SODIUM SUCC 125 MG IJ SOLR
80.0000 mg | Freq: Four times a day (QID) | INTRAMUSCULAR | Status: DC
  Administered 2012-04-24 – 2012-04-25 (×6): 80 mg via INTRAVENOUS
  Filled 2012-04-23 (×6): qty 1.28
  Filled 2012-04-23: qty 2
  Filled 2012-04-23 (×4): qty 1.28

## 2012-04-23 MED ORDER — ALBUTEROL SULFATE HFA 108 (90 BASE) MCG/ACT IN AERS
2.0000 | INHALATION_SPRAY | Freq: Four times a day (QID) | RESPIRATORY_TRACT | Status: DC
  Administered 2012-04-23 – 2012-04-26 (×9): 2 via RESPIRATORY_TRACT
  Filled 2012-04-23: qty 6.7

## 2012-04-23 NOTE — ED Notes (Signed)
Pt visiting her mother who is a patient on [address]. Developed SOB and walked over to [facility]. UCC administered baby ASA and nitroglycerin and had her transferred to the ER for further evaluation. Pt has cardiac history and history of anxiety. Pt alert and oriented x 4, neuro intact.

## 2012-04-23 NOTE — ED Provider Notes (Signed)
History     CSN: [id number]  Arrival date & time 04/23/12  1704   First MD Initiated Contact with Patient 04/23/12 1737      Chief Complaint  Patient presents with  . Shortness of Breath    (Consider location/radiation/quality/duration/timing/severity/associated sxs/prior treatment) HPI Comments: Patient referred to ER for further evaluation of difficulty breathing by the urgent care Center. Patient has ongoing problem with shortness of breath over the last week or so. She has been evaluated as an outpatient by her primary care provider. She was referred to the ER several days ago for evaluation of elevated d-dimer and rule out of PE. She had a VQ scan which was low probability. She continues to have shortness of breath. In addition to the shortness of breath, today she has had some heaviness in her chest. She was given aspirin nitroglycerin at urgent care and sent to the ER for further evaluation. At arrival she is still very dyspneic.  Patient is a 67 y.o. female presenting with shortness of breath.  Shortness of Breath  Associated symptoms include chest pain and shortness of breath.    Past Medical History  Diagnosis Date  . Asthma   . GERD (gastroesophageal reflux disease)   . CHF (congestive heart failure)   . Hypertension   . ICD (implantable cardiac defibrillator) in place   . Skin cancer 1999  . Shortness of breath     "@ any time I can get SOB" (03/22/2012)  . Migraines   . Arthritis     "hands and knees" (03/22/2012)    Past Surgical History  Procedure Date  . Cardiac defibrillator placement 03/22/2012    DDD  . Cholecystectomy ~ 2003  . Tonsillectomy and adenoidectomy 1950  . Skin cancer excision 1999    "top of my head and my right back shoulder"  . Mohs surgery 06/2011    "forehead" (03/22/2012)  . Supraventricular tachycardia ablation 03/22/2012    unable to induce VT/RN report 03/22/2012    No family history on file.  History  Substance Use Topics    . Smoking status: Never Smoker   . Smokeless tobacco: Never Used  . Alcohol Use: Yes     Comment: 03/22/2012 "mixed drink or beer q 2-3 months or so"    OB History    Grav Para Term Preterm Abortions TAB SAB Ect Mult Living                  Review of Systems  Respiratory: Positive for shortness of breath.   Cardiovascular: Positive for chest pain.  All other systems reviewed and are negative.    Allergies  Penicillins; Sulfonamide derivatives; Ivp dye; and Onion  Home Medications   Current Outpatient Rx  Name  Route  Sig  Dispense  Refill  . ALBUTEROL SULFATE HFA 108 (90 BASE) MCG/ACT IN AERS   Inhalation   Inhale 2 puffs into the lungs every 6 (six) hours as needed. For wheezing         . CARVEDILOL 12.5 MG PO TABS   Oral   Take 12.5 mg by mouth 2 (two) times daily with a meal.          . CETIRIZINE HCL 10 MG PO TABS   Oral   Take 10 mg by mouth daily.         Marland Kitchen VITAMIN D3 1000 UNITS PO CAPS   Oral   Take 1,000 Units by mouth daily.          Marland Kitchen  CITALOPRAM HYDROBROMIDE 20 MG PO TABS   Oral   Take 20 mg by mouth daily.         Marland Kitchen FLUTICASONE-SALMETEROL 250-50 MCG/DOSE IN AEPB   Inhalation   Inhale 1 puff into the lungs every 12 (twelve) hours.         . FUROSEMIDE 40 MG PO TABS   Oral   Take 40 mg by mouth daily.         Marland Kitchen GELATIN PO   Oral   Take 13 mg by mouth daily.         Marland Kitchen LOSARTAN POTASSIUM 50 MG PO TABS   Oral   Take 50 mg by mouth daily.         . OSTEO BI-FLEX ADV DOUBLE ST PO   Oral   Take 1 tablet by mouth every morning.          . MULTIVITAMIN PO   Oral   Take 1 tablet by mouth daily. ALIVE         . NAPROXEN SODIUM 220 MG PO TABS   Oral   Take 440 mg by mouth daily.         Marland Kitchen OMEPRAZOLE 20 MG PO CPDR   Oral   Take 40 mg by mouth daily.         Marland Kitchen POTASSIUM CHLORIDE CRYS ER 10 MEQ PO TBCR   Oral   Take 10 mEq by mouth 2 (two) times daily.           BP 132/75  Pulse 71  Temp 97.8 F (36.6 C)  (Oral)  Resp 24  SpO2 97%  Physical Exam  Constitutional: She is oriented to person, place, and time. She appears well-developed and well-nourished. No distress.  HENT:  Head: Normocephalic and atraumatic.  Right Ear: Hearing normal.  Nose: Nose normal.  Mouth/Throat: Oropharynx is clear and moist and mucous membranes are normal.  Eyes: Conjunctivae normal and EOM are normal. Pupils are equal, round, and reactive to light.  Neck: Normal range of motion. Neck supple.  Cardiovascular: Normal rate, regular rhythm, S1 normal and S2 normal.  Exam reveals no gallop and no friction rub.   No murmur heard. Pulmonary/Chest: Accessory muscle usage present. Tachypnea noted. She is in respiratory distress. She has decreased breath sounds in the right lower field and the left lower field. She has wheezes in the left upper field and the left lower field. She exhibits no tenderness.  Abdominal: Soft. Normal appearance and bowel sounds are normal. There is no hepatosplenomegaly. There is no tenderness. There is no rebound, no guarding, no tenderness at McBurney's point and negative Murphy's sign. No hernia.  Musculoskeletal: Normal range of motion.  Neurological: She is alert and oriented to person, place, and time. She has normal strength. No cranial nerve deficit or sensory deficit. Coordination normal. GCS eye subscore is 4. GCS verbal subscore is 5. GCS motor subscore is 6.  Skin: Skin is warm, dry and intact. No rash noted. No cyanosis.  Psychiatric: She has a normal mood and affect. Her speech is normal and behavior is normal. Thought content normal.    ED Course  Procedures (including critical care time)  Labs Reviewed  COMPREHENSIVE METABOLIC PANEL - Abnormal; Notable for the following:    Glucose, Bld 112 (*)     Albumin 3.4 (*)     Total Bilirubin 0.2 (*)     All other components within normal limits  PRO B NATRIURETIC PEPTIDE - Abnormal; Notable  for the following:    Pro B Natriuretic  peptide (BNP) 652.9 (*)     All other components within normal limits  CBC WITH DIFFERENTIAL  TROPONIN I  PROTIME-INR   Dg Chest Port 1 View  04/23/2012  *RADIOLOGY REPORT*  Clinical Data: Chest pain, shortness of breath.  PORTABLE CHEST - 1 VIEW  Comparison: 04/20/2012  Findings: Left subclavian AICD stable.  Mild cardiomegaly stable. Plate-like atelectasis or scarring at the right lung base.  Mild elevation of the right diaphragmatic leaflet.  No definite effusion.  Regional bones unremarkable.  Vascular clips in the right upper abdomen.  IMPRESSION:  1.  Little change since previous exam.   Original Report Authenticated By: D. Andria Rhein, MD      Diagnosis: asthma exacerbation with bronchitis    MDM  She presents to the ER tachypneic but not significantly hypoxic. Her workup has been unrevealing. She does have a slightly elevated natruretic peptide, but chest x-ray does not show any new findings. She does have some discomfort in her substernal chest region. This is a new complaint for her over the last few weeks she has been short of breath denied any chest pain. No acute findings on her workup here. She does have a previous history of asthma for which he takes albuterol and Advair. Patient remains very short of breath will require hospitalization.         Gilda Crease, MD 04/23/12 2020

## 2012-04-23 NOTE — ED Notes (Signed)
Extended history of  Medical issues; c/o SOB since last week; reportedly had scan done that showed her to have weak pos of pulmonary embolus; was instructed by her pulmonologist to come to the [facility] to be checked out for her problems; gasping for breath, PO2 + 97-98% on room air, color good

## 2012-04-23 NOTE — Progress Notes (Signed)
Respiratory protocol done and patient scored a 7. She has PRN albuterol at home inhaler . She has bilateral breath sounds at this time clear and diminished in the bases. She was given her scheduled Albuterol inhaler and per protocol QID schedule is good for now. Discontinued the double dose order of albuterol 2.5mg  neb but continued the prn Q2 neb for throughout the night is dyspnea. Will reevaluate 1/16

## 2012-04-23 NOTE — ED Provider Notes (Signed)
History     CSN: [id number]  Arrival date & time 04/23/12  1606   None     Chief Complaint  Patient presents with  . Shortness of Breath    (Consider location/radiation/quality/duration/timing/severity/associated sxs/prior treatment) HPI Comments: 67 year old morbidly obese female with a history of CHF, hypertension, ICD and shortness of breath presents to the urgent care after walking over from the hospital. She is complaining of severe shortness of breath although she states she has had shortness of breath for the past 3-4 months. She has been evaluated in the emergency department at [facility] by VQ scan which showed decreased perfusion to the right lower lobe matching ventilation defect corresponding to elevated right hemidiaphragm. There was no way shape segmental perfusion defects to suggest acute pulmonary embolism and the impression was very low probability for acute  pulmonary embolism per radiologist. Her d-dimer was elevated. At that emergency department visit on January 10 she had denied chest pain fever and cough. She also denies orthopnea, cough or fever. She is complaining of heaviness in fullness across her anterior chest, dyspnea and air hunger. She has a respiratory rate of approximately 50, pulse ox 96 and 97%.  Patient is a 67 y.o. female presenting with shortness of breath.  Shortness of Breath  Associated symptoms include chest pain and shortness of breath. Pertinent negatives include no fever and no wheezing.    Past Medical History  Diagnosis Date  . Asthma   . GERD (gastroesophageal reflux disease)   . CHF (congestive heart failure)   . Hypertension   . ICD (implantable cardiac defibrillator) in place   . Skin cancer 1999  . Shortness of breath     "@ any time I can get SOB" (03/22/2012)  . Migraines   . Arthritis     "hands and knees" (03/22/2012)    Past Surgical History  Procedure Date  . Cardiac defibrillator placement 03/22/2012    DDD  .  Cholecystectomy ~ 2003  . Tonsillectomy and adenoidectomy 1950  . Skin cancer excision 1999    "top of my head and my right back shoulder"  . Mohs surgery 06/2011    "forehead" (03/22/2012)  . Supraventricular tachycardia ablation 03/22/2012    unable to induce VT/RN report 03/22/2012    History reviewed. No pertinent family history.  History  Substance Use Topics  . Smoking status: Never Smoker   . Smokeless tobacco: Never Used  . Alcohol Use: Yes     Comment: 03/22/2012 "mixed drink or beer q 2-3 months or so"    OB History    Grav Para Term Preterm Abortions TAB SAB Ect Mult Living                  Review of Systems  Constitutional: Positive for diaphoresis, activity change and fatigue. Negative for fever.  Respiratory: Positive for chest tightness and shortness of breath. Negative for wheezing.   Cardiovascular: Positive for chest pain. Negative for leg swelling.  Neurological: Negative.   Psychiatric/Behavioral: Negative.     Allergies  Penicillins; Sulfonamide derivatives; and Ivp dye  Home Medications   Current Outpatient Rx  Name  Route  Sig  Dispense  Refill  . ALBUTEROL SULFATE HFA 108 (90 BASE) MCG/ACT IN AERS   Inhalation   Inhale 2 puffs into the lungs every 6 (six) hours as needed. For wheezing         . ASPIRIN EC 325 MG PO TBEC   Oral   Take 325 mg by  mouth daily.         Marland Kitchen CARVEDILOL 12.5 MG PO TABS   Oral   Take 12.5 mg by mouth 2 (two) times daily with a meal.          . CETIRIZINE HCL 10 MG PO TABS   Oral   Take 10 mg by mouth daily.         Marland Kitchen VITAMIN D3 1000 UNITS PO CAPS   Oral   Take 1 capsule by mouth daily.         Marland Kitchen CITALOPRAM HYDROBROMIDE 20 MG PO TABS   Oral   Take 20 mg by mouth daily.         Marland Kitchen FLUTICASONE-SALMETEROL 250-50 MCG/DOSE IN AEPB   Inhalation   Inhale 1 puff into the lungs every 12 (twelve) hours.         . FUROSEMIDE 40 MG PO TABS   Oral   Take 40 mg by mouth daily.         Marland Kitchen GELATIN PO    Oral   Take 13 mg by mouth daily.         Marland Kitchen LOSARTAN POTASSIUM 50 MG PO TABS   Oral   Take 50 mg by mouth daily.         . OSTEO BI-FLEX ADV DOUBLE ST PO   Oral   Take 2 tablets by mouth every morning.         . MULTIVITAMIN PO   Oral   Take 1 tablet by mouth daily.         Marland Kitchen NAPROXEN SODIUM 220 MG PO TABS   Oral   Take 440 mg by mouth daily.         Marland Kitchen OMEPRAZOLE 20 MG PO CPDR   Oral   Take 40 mg by mouth daily.         Marland Kitchen POTASSIUM CHLORIDE CRYS ER 10 MEQ PO TBCR   Oral   Take 10 mEq by mouth 2 (two) times daily.           BP 136/65  Pulse 81  Temp 98 F (36.7 C) (Oral)  Resp 40  SpO2 98%  Physical Exam  Nursing note and vitals reviewed. Constitutional: She is oriented to person, place, and time. She appears well-developed and well-nourished. She appears distressed.  Neck: Normal range of motion.  Cardiovascular: Normal rate and normal heart sounds.   Pulmonary/Chest:       Respiratory effort significantly increased, tachypnea with expiratory rate 40-50, no adventitious sounds.  Musculoskeletal: She exhibits no edema.  Neurological: She is alert and oriented to person, place, and time.  Skin: Skin is warm.       Skin damp    ED Course  Procedures (including critical care time)  Labs Reviewed - No data to display No results found.   1. Chest pain at rest   2. Dyspnea   3. Orthopnea       MDM   Patient walked from [facility] to the urgent care for severe persistent dyspnea.Having to stop every few steps due to exertional symptoms.    She states she has had severe shortness of breath for 3-4 weeks. She has been seen in the emergency department and received a VQ scan on January 10. The in patient was very low probability for acute pulmonary embolism. However the reading did show a decrease perfusion to the right lower lobe which matches the ventilation defect in corresponding to elevation of the  right hemidiaphragm. She is having  moderate to severe precordial tightness and heaviness. She is working very hard to breathe, she is tachypneic with a rate of 50 and a pulse ox of 96-97%. She will be transferred via care Link to the [place].  EKG: Intrinsic rhythm is normal sinus with PACs there are ST T-wave changes similar to the EKG obtained 03/29/2012.         Hayden Rasmussen, NP 04/23/12 2102

## 2012-04-23 NOTE — H&P (Signed)
[facility] History and Physical  Paula Booker ZOX:[id number] DOB: 09/23/1945 DOA: 04/23/2012  Referring physician: ED PCP: Cain Saupe, MD   Chief Complaint:  progressive shortness of breath  HPI:  67 year old female with history of cardiomyopathy with EF of 25% on ICD, asthma, GERD, hypertension, arthritis who has been having progressive shortness of breath for past few months which has gotten worse in last few weeks with dyspnea at rest. She followed with her cardiologist recently and was found to have an elevated d-dimer and was sent to the ED to get a VQ scan which showed a low probability for a PE. Patient does inform of progressive shortness of breath with orthopnea and PND. She denies any leg swellings however does have some wheezing with chest soreness. She also informs of some dry cough. Denies any fever or chills. She denies noticing any leg swellings, headache, dizziness, blurry vision, palpitations, abdominal pain, nausea, vomiting, bowel or urinary symptoms. She denies any recent weight gain. In the ED patient was noted to be short of breath and wheezy on exam and given a dose of IV Solu Medrol and nebs without much improvement. [facility] called for admission under observation. Patient denies any sick contacts, being bedbound, recent trauma or surgery and recent travel. Denies any leg swellings or pain.  Review of Systems:  Constitutional: Denies fever, chills, diaphoresis, appetite change and fatigue.  HEENT: Denies photophobia, eye pain, redness, hearing loss, ear pain, congestion, sore throat, rhinorrhea, sneezing, mouth sores, trouble swallowing, neck pain, neck stiffness and tinnitus.   Respiratory: SOB, DOE, cough, chest tightness,  and wheezing.   Cardiovascular: Complains of some chest tightness, orthopnea and PND, Denies  palpitations and leg swelling.  Gastrointestinal: Denies nausea, vomiting, abdominal pain, diarrhea, constipation, blood in stool and  abdominal distention.  Genitourinary: Denies dysuria, urgency, frequency, hematuria, flank pain and difficulty urinating.  Musculoskeletal: Denies myalgias, back pain, joint swelling, arthralgias and gait problem.  Skin: Denies pallor, rash and wound.  Neurological: Denies dizziness, seizures, syncope, weakness, light-headedness, numbness and headaches.  Hematological: Denies adenopathy. Easy bruising, personal or family bleeding history  Psychiatric/Behavioral: Denies suicidal ideation, mood changes, confusion, nervousness, sleep disturbance and agitation   Past Medical History  Diagnosis Date  . Asthma   . GERD (gastroesophageal reflux disease)   . CHF (congestive heart failure)   . Hypertension   . ICD (implantable cardiac defibrillator) in place   . Skin cancer 1999  . Shortness of breath     "@ any time I can get SOB" (03/22/2012)  . Migraines   . Arthritis     "hands and knees" (03/22/2012)   Past Surgical History  Procedure Date  . Cardiac defibrillator placement 03/22/2012    DDD  . Cholecystectomy ~ 2003  . Tonsillectomy and adenoidectomy 1950  . Skin cancer excision 1999    "top of my head and my right back shoulder"  . Mohs surgery 06/2011    "forehead" (03/22/2012)  . Supraventricular tachycardia ablation 03/22/2012    unable to induce VT/RN report 03/22/2012   Social History:  reports that she has never smoked. She has never used smokeless tobacco. She reports that she drinks alcohol. She reports that she does not use illicit drugs.  Allergies  Allergen Reactions  . Penicillins Anaphylaxis  . Sulfonamide Derivatives Other (See Comments)    Migraines  . Ivp Dye (Iodinated Diagnostic Agents) Hives  . Onion Other (See Comments)    Raw onions cause migraines    No family  history on file.  Prior to Admission medications   Medication Sig Start Date End Date Taking? Authorizing Provider  albuterol (PROVENTIL HFA;VENTOLIN HFA) 108 (90 BASE) MCG/ACT inhaler  Inhale 2 puffs into the lungs every 6 (six) hours as needed. For wheezing   Yes Historical Provider, MD  carvedilol (COREG) 12.5 MG tablet Take 12.5 mg by mouth 2 (two) times daily with a meal.    Yes Historical Provider, MD  cetirizine (ZYRTEC) 10 MG tablet Take 10 mg by mouth daily.   Yes Historical Provider, MD  Cholecalciferol (VITAMIN D3) 1000 UNITS CAPS Take 1,000 Units by mouth daily.    Yes Historical Provider, MD  citalopram (CELEXA) 20 MG tablet Take 20 mg by mouth daily.   Yes Historical Provider, MD  Fluticasone-Salmeterol (ADVAIR) 250-50 MCG/DOSE AEPB Inhale 1 puff into the lungs every 12 (twelve) hours.   Yes Historical Provider, MD  furosemide (LASIX) 40 MG tablet Take 40 mg by mouth daily.   Yes Historical Provider, MD  GELATIN PO Take 13 mg by mouth daily.   Yes Historical Provider, MD  losartan (COZAAR) 50 MG tablet Take 50 mg by mouth daily.   Yes Historical Provider, MD  Misc Natural Products (OSTEO BI-FLEX ADV DOUBLE ST PO) Take 1 tablet by mouth every morning.    Yes Historical Provider, MD  Multiple Vitamins-Minerals (MULTIVITAMIN PO) Take 1 tablet by mouth daily. ALIVE   Yes Historical Provider, MD  naproxen sodium (ALEVE) 220 MG tablet Take 440 mg by mouth daily.   Yes Historical Provider, MD  omeprazole (PRILOSEC) 20 MG capsule Take 40 mg by mouth daily.   Yes Historical Provider, MD  potassium chloride (K-DUR,KLOR-CON) 10 MEQ tablet Take 10 mEq by mouth 2 (two) times daily.   Yes Historical Provider, MD    Physical Exam:  Filed Vitals:   04/23/12 1830 04/23/12 1930 04/23/12 1945 04/23/12 2119  BP: 106/44 129/61 132/75   Pulse: 68 70 71 69  Temp:  97.8 F (36.6 C)    TempSrc:  Oral    Resp:  24 24 19   SpO2: 96% 96% 97% 96%    Constitutional: Vital signs reviewed.  Patient is a well-developed and well-nourished in some distress with increased respiratory effort. cooperative with exam.  Head: Normocephalic and atraumatic Ear: TM normal bilaterally Mouth: no  erythema or exudates, MMM Eyes: PERRL, EOMI, conjunctivae normal, No scleral icterus.  Neck: Supple, Trachea midline normal ROM, No JVD, mass, thyromegaly, or carotid bruit present.  Cardiovascular: RRR, S1 normal, S2 normal, no MRG, pulses symmetric and intact bilaterally Pulmonary/Chest: Bilateral scattered expiratory wheezes, no crackles or rhonchi. Abdominal: Soft. Non-tender, non-distended, bowel sounds are normal, no masses, organomegaly, or guarding present.  GU: no CVA tenderness Musculoskeletal: No joint deformities, erythema, or stiffness, ROM full and no nontender Ext: no edema and no cyanosis, pulses palpable bilaterally (DP and PT) Hematology: no cervical, inginal, or axillary adenopathy.  Neurological: A&O x3, Strenght is normal and symmetric bilaterally, cranial nerve II-XII are grossly intact, no focal motor deficit, sensory intact to light touch bilaterally.  Skin: Warm, dry and intact. No rash, cyanosis, or clubbing.  Psychiatric: Normal mood and affect. speech and behavior is normal. Judgment and thought content normal. Cognition and memory are normal.   Labs on Admission:  Basic Metabolic Panel:  Lab 04/23/12 1610  NA 142  K 4.2  CL 107  CO2 24  GLUCOSE 112*  BUN 15  CREATININE 0.61  CALCIUM 9.1  MG --  PHOS --  Liver Function Tests:  Lab 04/23/12 1823  AST 22  ALT 15  ALKPHOS 98  BILITOT 0.2*  PROT 6.6  ALBUMIN 3.4*   No results found for this basename: LIPASE:5,AMYLASE:5 in the last 168 hours No results found for this basename: AMMONIA:5 in the last 168 hours CBC:  Lab 04/23/12 1823  WBC 9.1  NEUTROABS 4.8  HGB 12.2  HCT 36.3  MCV 90.3  PLT 223   Cardiac Enzymes:  Lab 04/23/12 1824  CKTOTAL --  CKMB --  CKMBINDEX --  TROPONINI <0.30   BNP: No components found with this basename: POCBNP:5 CBG: No results found for this basename: GLUCAP:5 in the last 168 hours  Radiological Exams on Admission: Dg Chest Port 1 View  04/23/2012   *RADIOLOGY REPORT*  Clinical Data: Chest pain, shortness of breath.  PORTABLE CHEST - 1 VIEW  Comparison: 04/20/2012  Findings: Left subclavian AICD stable.  Mild cardiomegaly stable. Plate-like atelectasis or scarring at the right lung base.  Mild elevation of the right diaphragmatic leaflet.  No definite effusion.  Regional bones unremarkable.  Vascular clips in the right upper abdomen.  IMPRESSION:  1.  Little change since previous exam.   Original Report Authenticated By: D. Andria Rhein, MD     EKG: Normal sinus rhythm. Patient has some ST depressions in inferior and laterally which seems unchanged from a previous EKG.  Assessment/Plan Principal Problem:  *Progressive Shortness of breath Patient's symptoms are possible for a mild asthma exacerbation versus worsening off for her CHF symptoms. Her pro BNP is mildly elevated. Will admit her her under observation and would place her on IV Solu Medrol 80 mg every 6 hours along with albuterol nebs. -Patient informs having 2D echo done at her cardiologist office recently. I will consult her cardiologist Dr. Eldridge Dace in the morning for evaluation and the results of her 2-D echo. I will place her on IV Lasix 40 mg twice a day. She was evaluated in the ED recently for a VQ scan given positive d-dimer and had low probability for PE. I will discuss this with her cardiologist further end if her symptoms off shortness of breath not improve despite current medications and a CT and you chest to rule out PE might be necessary.  Active Problems:  Chronic systolic CHF (congestive heart failure) Patient had significantly low EF as per last echo and has an ICD in place. Follows with Dr. Eldridge Dace. Continue with IV Lasix, lisinopril and Coreg. Monitor daily weight and strict I.'s and Os   GERD (gastroesophageal reflux disease) Continue with PPI  Diet: Cardiac DVT prophylaxis: Subcutaneous Lovenox  Code Status: Full code Family Communication: Daughter at  bedside Disposition Plan: Home once stable  Eddie North [facility] Pager [phone]  If 7PM-7AM, please contact night-coverage [email or web address] Password [facility] 04/23/2012, 10:16 PM  Total time spent: 70 minutes

## 2012-04-24 ENCOUNTER — Institutional Professional Consult (permissible substitution): Payer: Medicare Other | Admitting: Internal Medicine

## 2012-04-24 DIAGNOSIS — I5023 Acute on chronic systolic (congestive) heart failure: Secondary | ICD-10-CM

## 2012-04-24 DIAGNOSIS — R55 Syncope and collapse: Secondary | ICD-10-CM

## 2012-04-24 DIAGNOSIS — J45909 Unspecified asthma, uncomplicated: Secondary | ICD-10-CM

## 2012-04-24 MED ORDER — OXYCODONE-ACETAMINOPHEN 5-325 MG PO TABS
1.0000 | ORAL_TABLET | ORAL | Status: DC | PRN
  Administered 2012-04-24: 1 via ORAL
  Filled 2012-04-24: qty 1

## 2012-04-24 MED ORDER — FUROSEMIDE 10 MG/ML IJ SOLN
40.0000 mg | Freq: Three times a day (TID) | INTRAMUSCULAR | Status: DC
  Administered 2012-04-24 – 2012-04-25 (×2): 40 mg via INTRAVENOUS
  Filled 2012-04-24 (×4): qty 4

## 2012-04-24 NOTE — Progress Notes (Signed)
Addendum  Patient seen and examined, chart and data base reviewed.  I agree with the above assessment and plan.  For full details please see Mrs. Toya Smothers NP note.  Progressive shortness of breath, multifactorial likely acute on chronic CHF, LVEF 20-25%.  Some wheezes, likely cardiac asthma. Continue diuresis follow renal function and electrolytes.   Clint Lipps, MD [facility] Pager: [phone] 04/24/2012, 1:56 PM

## 2012-04-24 NOTE — Progress Notes (Signed)
[facility] PROGRESS NOTE  CRISTIAN GRIEVES OZH:[id number] DOB: Sep 18, 1945 DOA: 04/23/2012 PCP: Cain Saupe, MD  Assessment/Plan: Progressive Shortness of breath  Patient's symptoms are possible for a mild asthma exacerbation versus worsening off for her CHF symptoms. Her pro BNP is mildly elevated. Only slight improvement this am. continue IV Solu Medrol 80 mg every 6 hours along with albuterol nebs.  -Patient informs having 2D echo done at her cardiologist office recently.  continue IV Lasix 40 mg but increase to TID. She was evaluated in the ED recently for a VQ scan given positive d-dimer and had low probability for PE.  Active Problems:  Chronic systolic CHF (congestive heart failure)  Patient had significantly low EF as per last echo and has an ICD in place. Follows with Dr. Eldridge Dace. Continue with IV Lasix, lisinopril and Coreg.  Monitor daily weight and strict I.'s and Os  -volume status - 1.7 GERD (gastroesophageal reflux disease)  Continue with PPI  Code Status: full  Family Communication:  Disposition Plan: home when ready   Consultants:  cardiology  Procedures:  none  Antibiotics:  none  HPI/Subjective: Awake alert reports minimal improvement denies pain  Objective: Filed Vitals:   04/23/12 2320 04/23/12 2344 04/24/12 0757 04/24/12 0809  BP: 130/78  138/82   Pulse: 84  87   Temp: 97.9 F (36.6 C)     TempSrc: Oral     Resp: 20     Height: 5\' 4"  (1.626 m)     Weight: 97.7 kg (215 lb 6.2 oz)     SpO2: 96% 96%  94%    Intake/Output Summary (Last 24 hours) at 04/24/12 1223 Last data filed at 04/24/12 1218  Gross per 24 hour  Intake    482 ml  Output   2250 ml  Net  -1768 ml   Filed Weights   04/23/12 2320  Weight: 97.7 kg (215 lb 6.2 oz)    Exam:   General:  Awake alert oriented  Cardiovascular: RRR no MGR No LEE  Respiratory: mild increased work of breathing with exertion. BS distant on left. Diffuse rhonchi with expiratory wheeze.  No crackles  Abdomen: obese soft +BS non-tender to palpation  Data Reviewed: Basic Metabolic Panel:  Lab 04/23/12 6295  NA 142  K 4.2  CL 107  CO2 24  GLUCOSE 112*  BUN 15  CREATININE 0.61  CALCIUM 9.1  MG --  PHOS --   Liver Function Tests:  Lab 04/23/12 1823  AST 22  ALT 15  ALKPHOS 98  BILITOT 0.2*  PROT 6.6  ALBUMIN 3.4*   No results found for this basename: LIPASE:5,AMYLASE:5 in the last 168 hours No results found for this basename: AMMONIA:5 in the last 168 hours CBC:  Lab 04/23/12 1823  WBC 9.1  NEUTROABS 4.8  HGB 12.2  HCT 36.3  MCV 90.3  PLT 223   Cardiac Enzymes:  Lab 04/23/12 1824  CKTOTAL --  CKMB --  CKMBINDEX --  TROPONINI <0.30   BNP (last 3 results)  Basename 04/23/12 1824  PROBNP 652.9*   CBG: No results found for this basename: GLUCAP:5 in the last 168 hours  No results found for this or any previous visit (from the past 240 hour(s)).   Studies: Dg Chest Port 1 View  04/23/2012  *RADIOLOGY REPORT*  Clinical Data: Chest pain, shortness of breath.  PORTABLE CHEST - 1 VIEW  Comparison: 04/20/2012  Findings: Left subclavian AICD stable.  Mild cardiomegaly stable. Plate-like atelectasis or scarring at  the right lung base.  Mild elevation of the right diaphragmatic leaflet.  No definite effusion.  Regional bones unremarkable.  Vascular clips in the right upper abdomen.  IMPRESSION:  1.  Little change since previous exam.   Original Report Authenticated By: D. Andria Rhein, MD     Scheduled Meds:   . albuterol  2 puff Inhalation QID  . carvedilol  12.5 mg Oral BID WC  . cholecalciferol  1,000 Units Oral Daily  . citalopram  20 mg Oral Daily  . enoxaparin (LOVENOX) injection  40 mg Subcutaneous Q24H  . furosemide  40 mg Intravenous Q8H  . loratadine  10 mg Oral Daily  . losartan  50 mg Oral Daily  . methylPREDNISolone (SOLU-MEDROL) injection  80 mg Intravenous Q6H  . naproxen  250 mg Oral BID WC  . pantoprazole  40 mg Oral Daily  .  potassium chloride  10 mEq Oral BID  . sodium chloride  3 mL Intravenous Q12H   Continuous Infusions:   Principal Problem:  *Shortness of breath Active Problems:  Chronic systolic CHF (congestive heart failure)  Type B WPW syndrome  GERD (gastroesophageal reflux disease)  Asthma exacerbation    Time spent: 30 minutes    [facility]  [facility]  If 8PM-8AM, please contact night-coverage at [email or web address], password [facility] 04/24/2012, 12:23 PM  LOS: 1 day

## 2012-04-25 ENCOUNTER — Inpatient Hospital Stay (HOSPITAL_COMMUNITY): Payer: Medicare Other

## 2012-04-25 DIAGNOSIS — M7989 Other specified soft tissue disorders: Secondary | ICD-10-CM

## 2012-04-25 DIAGNOSIS — I10 Essential (primary) hypertension: Secondary | ICD-10-CM

## 2012-04-25 DIAGNOSIS — K219 Gastro-esophageal reflux disease without esophagitis: Secondary | ICD-10-CM

## 2012-04-25 LAB — CBC
Hemoglobin: 13.5 g/dL (ref 12.0–15.0)
MCH: 31 pg (ref 26.0–34.0)
MCV: 91 fL (ref 78.0–100.0)
Platelets: 241 10*3/uL (ref 150–400)
RBC: 4.35 MIL/uL (ref 3.87–5.11)

## 2012-04-25 LAB — BLOOD GAS, ARTERIAL
Acid-Base Excess: 3.6 mmol/L — ABNORMAL HIGH (ref 0.0–2.0)
Bicarbonate: 27.3 mEq/L — ABNORMAL HIGH (ref 20.0–24.0)
FIO2: 0.21 %
O2 Saturation: 91.9 %
Patient temperature: 98.6
TCO2: 28.5 mmol/L (ref 0–100)
pO2, Arterial: 59.7 mmHg — ABNORMAL LOW (ref 80.0–100.0)

## 2012-04-25 LAB — BASIC METABOLIC PANEL
BUN: 33 mg/dL — ABNORMAL HIGH (ref 6–23)
CO2: 26 mEq/L (ref 19–32)
Calcium: 9.9 mg/dL (ref 8.4–10.5)
Creatinine, Ser: 0.84 mg/dL (ref 0.50–1.10)
Glucose, Bld: 294 mg/dL — ABNORMAL HIGH (ref 70–99)

## 2012-04-25 MED ORDER — PREDNISONE 20 MG PO TABS
40.0000 mg | ORAL_TABLET | Freq: Every day | ORAL | Status: DC
  Administered 2012-04-26: 40 mg via ORAL
  Filled 2012-04-25 (×2): qty 2

## 2012-04-25 MED ORDER — FUROSEMIDE 10 MG/ML IJ SOLN
40.0000 mg | Freq: Two times a day (BID) | INTRAMUSCULAR | Status: DC
  Administered 2012-04-25 – 2012-04-26 (×2): 40 mg via INTRAVENOUS
  Filled 2012-04-25 (×2): qty 4

## 2012-04-25 MED ORDER — ALPRAZOLAM 0.25 MG PO TABS
0.2500 mg | ORAL_TABLET | Freq: Three times a day (TID) | ORAL | Status: DC | PRN
  Administered 2012-04-25: 0.25 mg via ORAL
  Filled 2012-04-25: qty 1

## 2012-04-25 NOTE — Care Management Note (Addendum)
    Page 1 of 1   04/26/2012     2:48:42 PM   CARE MANAGEMENT NOTE 04/26/2012  Patient:  Paula Booker, Paula Booker   Account Number:  [id number]  Date Initiated:  04/25/2012  Documentation initiated by:  Letha Cape  Subjective/Objective Assessment:   dx sob, hx chf  admit- lives with mom and brother.  pta indepdent.     Action/Plan:   pt eval- recs out pt pt   Anticipated DC Date:  04/25/2012   Anticipated DC Plan:  HOME/SELF CARE      DC Planning Services  CM consult      Choice offered to / List presented to:             Status of service:  Completed, signed off Medicare Important Message given?   (If response is "NO", the following Medicare IM given date fields will be blank) Date Medicare IM given:   Date Additional Medicare IM given:    Discharge Disposition:  HOME/SELF CARE  Per UR Regulation:  Reviewed for med. necessity/level of care/duration of stay  If discussed at Long Length of Stay Meetings, dates discussed:    Comments:  04/26/12 14:47 Letha Cape RN, BSN [phone] patient for dc today, physical therapy recs outpt pt, referral faxed to neurorehabilitiation, gave information to patient.  04/25/12 13:52 Letha Cape RN, BSN [phone] patient lives with mom and grandson.  PTA independent. Patient has medication coverage and transportation at discharge. No needs identified.  NCM will continue to follow for dc needs.

## 2012-04-25 NOTE — Progress Notes (Signed)
[facility] PROGRESS NOTE  Paula Booker ZOX:[id number] DOB: 09-08-1945 DOA: 04/23/2012 PCP: Cain Saupe, MD  Assessment/Plan: Progressive Shortness of breath  Patient's symptoms are possible for a mild asthma exacerbation versus worsening off for her CHF symptoms. Her pro BNP is mildly elevated. Some improvement.  Change Solu Medrol to po prednisone. Will continue albuterol nebs.  -Patient informs having 2D echo done at her cardiologist office recently. continue IV Lasix 40 mg but decrease to BID. She was evaluated in the ED recently for a VQ scan given positive d-dimer and had low probability for PE.  -Dr. Jerral Ralph spoke to Dr. Eldridge Dace this am who indicated EF 20-25% which was unchanged from previous, no pericardial effusion, recent cath negative. He recommended ABG. He opined that issue likely not cardiac. May consider pulmonary consult or BH consult  Active Problems:  Chronic systolic CHF (congestive heart failure)  Patient had significantly low EF as per last echo and has an ICD in place. Follows with Dr. Eldridge Dace. Continue with IV Lasix, lisinopril and Coreg. See plan for problem #1. Monitor daily weight and strict I.'s and Os  -volume status - 1.9L  -wt 91.7kg down from 97.7kg on admission  GERD (gastroesophageal reflux disease)  Continue with PPI  HTN -controlled -continue coreg, cozaar  Code Status:  Family Communication:  Disposition Plan: home when ready   Consultants:  none  Procedures:  none  Antibiotics:  none  HPI/Subjective: Awake alert. Reports continued sob  Objective: Filed Vitals:   04/24/12 2133 04/25/12 0516 04/25/12 0829 04/25/12 0900  BP: 147/76 120/68  121/77  Pulse: 83 84  90  Temp: 98.4 F (36.9 C) 97.6 F (36.4 C)    TempSrc: Oral Oral    Resp: 18     Height:      Weight:  91.7 kg (202 lb 2.6 oz)    SpO2: 94% 91% 94%     Intake/Output Summary (Last 24 hours) at 04/25/12 1123 Last data filed at 04/25/12 0734  Gross per  24 hour  Intake   1054 ml  Output   1600 ml  Net   -546 ml   Filed Weights   04/23/12 2320 04/25/12 0516  Weight: 97.7 kg (215 lb 6.2 oz) 91.7 kg (202 lb 2.6 oz)    Exam:   General:  Well nourished non-toxic nAD  Cardiovascular: RRR No MGR No LEE  Respiratory: mild increased work of breathing. BSCTAB  Abdomen: soft, obese, non-tender to palpation +BS  Data Reviewed: Basic Metabolic Panel:  Lab 04/23/12 8119  NA 142  K 4.2  CL 107  CO2 24  GLUCOSE 112*  BUN 15  CREATININE 0.61  CALCIUM 9.1  MG --  PHOS --   Liver Function Tests:  Lab 04/23/12 1823  AST 22  ALT 15  ALKPHOS 98  BILITOT 0.2*  PROT 6.6  ALBUMIN 3.4*   No results found for this basename: LIPASE:5,AMYLASE:5 in the last 168 hours No results found for this basename: AMMONIA:5 in the last 168 hours CBC:  Lab 04/23/12 1823  WBC 9.1  NEUTROABS 4.8  HGB 12.2  HCT 36.3  MCV 90.3  PLT 223   Cardiac Enzymes:  Lab 04/23/12 1824  CKTOTAL --  CKMB --  CKMBINDEX --  TROPONINI <0.30   BNP (last 3 results)  Basename 04/23/12 1824  PROBNP 652.9*   CBG: No results found for this basename: GLUCAP:5 in the last 168 hours  No results found for this or any previous visit (from the  past 240 hour(s)).   Studies: Dg Chest Port 1 View  04/23/2012  *RADIOLOGY REPORT*  Clinical Data: Chest pain, shortness of breath.  PORTABLE CHEST - 1 VIEW  Comparison: 04/20/2012  Findings: Left subclavian AICD stable.  Mild cardiomegaly stable. Plate-like atelectasis or scarring at the right lung base.  Mild elevation of the right diaphragmatic leaflet.  No definite effusion.  Regional bones unremarkable.  Vascular clips in the right upper abdomen.  IMPRESSION:  1.  Little change since previous exam.   Original Report Authenticated By: D. Andria Rhein, MD     Scheduled Meds:   . albuterol  2 puff Inhalation QID  . carvedilol  12.5 mg Oral BID WC  . cholecalciferol  1,000 Units Oral Daily  . citalopram  20 mg Oral  Daily  . enoxaparin (LOVENOX) injection  40 mg Subcutaneous Q24H  . furosemide  40 mg Intravenous BID  . loratadine  10 mg Oral Daily  . losartan  50 mg Oral Daily  . naproxen  250 mg Oral BID WC  . pantoprazole  40 mg Oral Daily  . potassium chloride  10 mEq Oral BID  . predniSONE  40 mg Oral Q breakfast  . sodium chloride  3 mL Intravenous Q12H   Continuous Infusions:   Principal Problem:  *Shortness of breath Active Problems:  Acute on chronic systolic CHF (congestive heart failure)  Type B WPW syndrome  GERD (gastroesophageal reflux disease)  Asthma exacerbation    Time spent: 30 minutes     [facility]  [facility] . If 8PM-8AM, please contact night-coverage at [email or web address], password [facility] 04/25/2012, 11:23 AM  LOS: 2 days    Attending -I have seen and examined the patient. I agree with assessment and plan as outlined as above. Patient has h/o Systolic CHF (non ischemic cardiomyopathy), WPW synd-s/p AICD placement, Asthma-admitted for worsening exertional dyspnea and orthopnea. She has recently seen Dr Eldridge Dace in the clinic a few times for these complaints as well,I spoke with Dr Blima Singer Echo is essentially unchanged, VQ Scan is of low probability. On exam she does not appear wet to suggest CHF decompensation. Repeat CXR done today is clear as well. Lower extremity venous dopplers are negative.  She does have a h/o Asthma-but lungs are clear today-will change to prednisone today. Will get a non-contrast CT chest to evaluate lung parenchyma-if that is negative then-this may be anxiety etc related-she is under a lot of stress-her mother is also very sick and is in the hospital. May need a PCCM eval for PFT's Likely d/c tomorrow if work up continues to be negative.  S Ghimire

## 2012-04-25 NOTE — Progress Notes (Signed)
*  Preliminary Results* Bilateral lower extremity venous duplex completed. Bilateral lower extremities are negative for deep vein thrombosis. No evidence of Baker's cyst bilaterally.  04/25/2012 1:27 PM Gertie Fey, RDMS, RDCS

## 2012-04-26 DIAGNOSIS — F329 Major depressive disorder, single episode, unspecified: Secondary | ICD-10-CM

## 2012-04-26 LAB — BASIC METABOLIC PANEL
BUN: 43 mg/dL — ABNORMAL HIGH (ref 6–23)
CO2: 30 mEq/L (ref 19–32)
Calcium: 9.9 mg/dL (ref 8.4–10.5)
GFR calc non Af Amer: 67 mL/min — ABNORMAL LOW (ref >90)
Glucose, Bld: 182 mg/dL — ABNORMAL HIGH (ref 70–99)
Sodium: 139 mEq/L (ref 135–145)

## 2012-04-26 LAB — CBC
HCT: 41.7 % (ref 36.0–46.0)
Hemoglobin: 14.2 g/dL (ref 12.0–15.0)
MCH: 31.1 pg (ref 26.0–34.0)
MCHC: 34.1 g/dL (ref 30.0–36.0)
MCV: 91.2 fL (ref 78.0–100.0)
RBC: 4.57 MIL/uL (ref 3.87–5.11)

## 2012-04-26 MED ORDER — ALPRAZOLAM 0.25 MG PO TABS: 0.2500 mg | ORAL_TABLET | Freq: Three times a day (TID) | ORAL | Status: DC | PRN

## 2012-04-26 MED ORDER — OXYCODONE-ACETAMINOPHEN 5-325 MG PO TABS: 1.0000 | ORAL_TABLET | ORAL | Status: DC | PRN

## 2012-04-26 MED ORDER — PREDNISONE 20 MG PO TABS: ORAL_TABLET | ORAL | Status: DC

## 2012-04-26 MED ORDER — GUAIFENESIN-DM 100-10 MG/5ML PO SYRP: 5.0000 mL | ORAL_SOLUTION | ORAL | Status: DC | PRN

## 2012-04-26 NOTE — Discharge Summary (Signed)
Physician Discharge Summary  Paula Booker ION:[id number] DOB: January 15, 1946 DOA: 04/23/2012  PCP: Cain Saupe, MD  Admit date: 04/23/2012 Discharge date: 04/26/2012  Time spent: 40 minutes  Recommendations for Outpatient Follow-up:  1. Follow up with PCP in 1 week 2. Consider referral to Psychiatrist  Discharge Diagnoses:  Principal Problem:  *Shortness of breath-multifactorial Active Problems:  Acute on chronic systolic CHF (congestive heart failure)  Type B WPW syndrome  GERD (gastroesophageal reflux disease)  Asthma exacerbation  Discharge Condition: stable and ready for discharge  Diet recommendation: heart healthy  Filed Weights   04/23/12 2320 04/25/12 0516 04/26/12 0635  Weight: 97.7 kg (215 lb 6.2 oz) 91.7 kg (202 lb 2.6 oz) 90.7 kg (199 lb 15.3 oz)    History of present illness:   Paula Booker 67 year old female with history of cardiomyopathy with EF of 25% on ICD, asthma, GERD, hypertension, arthritis who had been having progressive shortness of breath for few months which has gotten worse in last few weeks with dyspnea at rest presented to ED on 04/23/12 with cc SOB. She followed with her cardiologist recently and was found to have an elevated d-dimer and was sent to the ED to get a VQ scan which showed a low probability for a PE. Patient reported progressive shortness of breath with orthopnea and PND. She denied any leg swellings however did have some wheezing with chest soreness. She also informed of some dry cough. Denied any fever or chills. She denied noticing any leg swellings, headache, dizziness, blurry vision, palpitations, abdominal pain, nausea, vomiting, bowel or urinary symptoms. She denied any recent weight gain. In the ED patient was noted to be short of breath and wheezy on exam and given a dose of IV Solu Medrol and nebs without much improvement. [facility] called for admission under observation. Patient denied any sick contacts, being bedbound, recent  trauma or surgery and recent travel. Denied any leg swellings or pain.    Hospital Course:  Progressive Shortness of breath  -likely multifactorial in etiology-with components from Chronic Systolic Heart Failure, Asthma, anxiety all playing a role. Although she does have a h/o chronic systolic heart failure-she did not have signs of volume overload. She did not appear wet on exam.[facility] who had seen her earlier did hear some wheezing on exam-as a result patient was placed on steroids and nebulized bronchodilators. During my exam-for the past 2 days her lungs are completely clear.  Her Symptoms are mostly SOB on exertion and orthopnea. Unfortunately her symptoms seem out of proportion to any physical findings on exam.  A VQ Scan done on 04/21/12 was read as low probabiltiy, lower ext dopplers were also negative for DVT. Non contrast CT chest did not show significant parenchymal abnormalities.Cardiac enzymes were negative, per Dr Geryl Councilman approximately 1-2 years ago showed normal Coronaries, and he did not think her SOB was related to any cardiac cause. -Dr Jerral Ralph then spoke with Dr Ruthe Mannan did convey  that patient did have a outpatient Echo-which was essentially unchanged from previous. 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 -At this time-we will defer any further up here in the hospital, she has a outpatient appointment with Dr Jolyn Nap PFT's etc can be done.   Active Problems:  Chronic systolic CHF (congestive heart failure)  Patient had significantly low  EF as per last echo and has an ICD in place, no pericardial effusion, recent neg cath. See #1. Repeat chest xray on 1/15 clear. On exam pt did not appear wet.   -volume status at discharge 2.1L  -wt 90.7kg on discharge down from  97.7kg on admission   GERD (gastroesophageal reflux disease)  Continue with PPI . Stable at baseline at discharge  HTN  - fair control during this hospitalization  -continue coreg, cozaar   Depression/Anxiety -continue with Celexa. -as needed Xanax  -have asked patient to see if her PCP will refer her to psychiatry -she is currently under a lot of stress-as she is taking care of her mother who may need hospice care. Per her, there is disagreement with her sister in this regard as well.   Procedures:  none  Consultations:  PCCM  Discharge Exam: Filed Vitals:   04/26/12 0518 04/26/12 0635 04/26/12 1200 04/26/12 1352  BP: 142/81   130/74  Pulse: 69  74 84  Temp: 97.6 F (36.4 C)   97.7 F (36.5 C)  TempSrc: Oral   Oral  Resp: 18   18  Height:      Weight:  90.7 kg (199 lb 15.3 oz)    SpO2: 92%  94% 94%    General: awake alert oriented Cardiovascular: RRR No MGR No LEE Respiratory: mild increased work of breathing with conversation. BSCTAB  Discharge Instructions      Discharge Orders    Future Appointments: Provider: Department: Dept Phone: Center:   05/03/2012 3:45 PM Nyoka Cowden, MD [facility] [phone] None   07/03/2012 2:15 PM Marinus Maw, MD [facility] Main Office [place]) [phone] [id number]       Medication List     As of 04/26/2012  2:23 PM    TAKE these medications         albuterol 108 (90 BASE) MCG/ACT inhaler   Commonly known as: PROVENTIL HFA;VENTOLIN HFA   Inhale 2 puffs into the lungs every 6 (six) hours as needed. For wheezing      ALEVE 220 MG tablet   Generic drug: naproxen sodium   Take 440 mg by mouth daily.      ALPRAZolam 0.25 MG tablet   Commonly known as: XANAX   Take 1 tablet (0.25 mg total) by mouth 3 (three) times daily as needed for anxiety.      carvedilol 12.5 MG tablet   Commonly known as: COREG   Take 12.5 mg by mouth 2 (two) times daily with a meal.      cetirizine 10 MG tablet    Commonly known as: ZYRTEC   Take 10 mg by mouth daily.      citalopram 20 MG tablet   Commonly known as: CELEXA   Take 20 mg by mouth daily.      Fluticasone-Salmeterol 250-50 MCG/DOSE Aepb   Commonly known as: ADVAIR   Inhale 1 puff into the lungs every 12 (twelve) hours.      furosemide 40 MG tablet   Commonly known as: LASIX   Take 40 mg by mouth daily.      GELATIN PO   Take 13 mg by mouth daily.      guaiFENesin-dextromethorphan 100-10 MG/5ML syrup   Commonly known as: ROBITUSSIN DM   Take 5 mLs by mouth every 4 (four) hours as needed for cough.      losartan 50 MG tablet   Commonly known as: COZAAR   Take 50 mg  by mouth daily.      MULTIVITAMIN PO   Take 1 tablet by mouth daily. ALIVE      omeprazole 20 MG capsule   Commonly known as: PRILOSEC   Take 40 mg by mouth daily.      OSTEO BI-FLEX ADV DOUBLE ST PO   Take 1 tablet by mouth every morning.      oxyCODONE-acetaminophen 5-325 MG per tablet   Commonly known as: PERCOCET/ROXICET   Take 1-2 tablets by mouth every 4 (four) hours as needed.      potassium chloride 10 MEQ tablet   Commonly known as: K-DUR,KLOR-CON   Take 10 mEq by mouth 2 (two) times daily.      predniSONE 20 MG tablet   Commonly known as: DELTASONE   Take 3 tabs 1/17 and 1/18  Take 2 tabs 1/19 and 1/20  Take 1 tab 1/21 and 1/22 then stop      Vitamin D3 1000 UNITS Caps   Take 1,000 Units by mouth daily.         Follow-up Information    Follow up with FULP, CAMMIE, MD. Schedule an appointment as soon as possible for a visit in 1 week.   Contact information:   [address] [place], [place] [address] [place] [address] [phone]       Follow up with Sandrea Hughs, MD. On 05/03/2012. (appointment at 3:45 pm)    Contact information:   [address] [place] [address] [phone]           The results of significant diagnostics from this hospitalization (including imaging, microbiology, ancillary and  laboratory) are listed below for reference.    Significant Diagnostic Studies: Dg Chest 2 View  04/25/2012  *RADIOLOGY REPORT*  Clinical Data: Shortness of breath.  CHEST - 2 VIEW  Comparison: 01/13 and 04/20/2012  Findings: AICD in place.  Heart size and vascularity are normal. Persistent chronic elevation of the right hemidiaphragm and chronic atelectasis at the right lung base. The atelectasis at the right base has slightly increased posteriorly.  No effusions.  No acute osseous abnormality.  IMPRESSION: Slight increased atelectasis at the right base posteriorly.   Original Report Authenticated By: Francene Boyers, M.D.    Ct Chest Wo Contrast  04/25/2012  *RADIOLOGY REPORT*  Clinical Data: Shortness of breath.  History of congestive heart failure.  CT CHEST WITHOUT CONTRAST  Technique:  Multidetector CT imaging of the chest was performed following the standard protocol without IV contrast.  Comparison: Two-view chest 04/26/2011.  Findings: Cardiac pacing wires are in place in the right atrium and right ventricle.  The heart size is normal.  There is no significant pleural or pericardial effusion.  A calcified high right paratracheal lymph node is present.  There are calcified nodes at the right hilum as well.  No other significant mediastinal or axillary adenopathy is present.  There is partial collapse of the right lower lobe and minimal atelectasis in the right middle lobe.  No obstructing lesion is evident.  The left lung is clear. A punctate calcification is present in the right upper lobe image 18 of series 3.  No other focal nodule, mass, or airspace disease is present.  The bone windows demonstrate to minimal degenerative change in the thoracic spine.  IMPRESSION:  1.  Mild volume loss at the right lung base likely represents atelectasis. 2.  Minimal atelectasis in the right middle lobe. 3.  Cardiac pacing wires as described.  4.  No acute or focal abnormality to explain shortness of breath. 5.   History of granulomatous disease in the right upper lobe.   Original Report Authenticated By: Marin Roberts, M.D.    Nm Pulmonary Perf And Vent  04/21/2012  *RADIOLOGY REPORT*  Clinical Data:  Elevated D-dimer, concern for pulmonary embolism.  NUCLEAR MEDICINE VENTILATION - PERFUSION LUNG SCAN  Technique:  Wash-in, equilibrium, and wash-out phase ventilation images were obtained using Xe-133 gas.  Perfusion images were obtained in multiple projections after intravenous injection of Tc- 29m MAA.  Radiopharmaceuticals:  16 mCi Xe-133 gas and 2.0 mCi Tc-75m MAA.  Comparison: Chest radiograph 04/20/2012  Findings:  Ventilation:  There is decreased ventilation to the right lower lobe.  This matches the elevated hemidiaphragm.  Perfusion:  There is decreased perfusion to right lower lobe which matches the ventilation defect and corresponds elevated right hemidiaphragm.  There are some patchy perfusion abnormalities to the right lower lobe which are not segmental. No wedge shaped segmental perfusion defects to suggest acute pulmonary embolism.  IMPRESSION:  Very low probability for acute pulmonary embolism.   Original Report Authenticated By: Genevive Bi, M.D.    Dg Chest Port 1 View  04/23/2012  *RADIOLOGY REPORT*  Clinical Data: Chest pain, shortness of breath.  PORTABLE CHEST - 1 VIEW  Comparison: 04/20/2012  Findings: Left subclavian AICD stable.  Mild cardiomegaly stable. Plate-like atelectasis or scarring at the right lung base.  Mild elevation of the right diaphragmatic leaflet.  No definite effusion.  Regional bones unremarkable.  Vascular clips in the right upper abdomen.  IMPRESSION:  1.  Little change since previous exam.   Original Report Authenticated By: D. Andria Rhein, MD    Dg Chest Port 1 View  03/29/2012  *RADIOLOGY REPORT*  Clinical Data: Pain across chest, hx of htn  PORTABLE CHEST - 1 VIEW  Comparison: 03/23/2012  Findings: The heart and pulmonary vascularity are stable.  A pacing  device is again seen.  No focal infiltrate or sizable effusion is noted.  No pneumothorax is seen.  IMPRESSION: No acute abnormality noted.   Original Report Authenticated By: Alcide Clever, M.D.     Microbiology: No results found for this or any previous visit (from the past 240 hour(s)).   Labs: Basic Metabolic Panel:  Lab 04/26/12 1610 04/25/12 1007 04/23/12 1823  NA 139 139 142  K 3.6 3.7 4.2  CL 97 97 107  CO2 30 26 24   GLUCOSE 182* 294* 112*  BUN 43* 33* 15  CREATININE 0.88 0.84 0.61  CALCIUM 9.9 9.9 9.1  MG -- -- --  PHOS -- -- --   Liver Function Tests:  Lab 04/23/12 1823  AST 22  ALT 15  ALKPHOS 98  BILITOT 0.2*  PROT 6.6  ALBUMIN 3.4*   No results found for this basename: LIPASE:5,AMYLASE:5 in the last 168 hours No results found for this basename: AMMONIA:5 in the last 168 hours CBC:  Lab 04/26/12 0715 04/25/12 1007 04/23/12 1823  WBC 17.4* 21.1* 9.1  NEUTROABS -- -- 4.8  HGB 14.2 13.5 12.2  HCT 41.7 39.6 36.3  MCV 91.2 91.0 90.3  PLT 274 241 223   Cardiac Enzymes:  Lab 04/23/12 1824  CKTOTAL --  CKMB --  CKMBINDEX --  TROPONINI <0.30   BNP: BNP (last 3 results)  Basename 04/25/12 1007 04/23/12 1824  PROBNP 641.7* 652.9*   CBG: No results found for this basename: GLUCAP:5 in the last 168 hours     Signed:  Jeoffrey Massed  [facility] 04/26/2012, 2:23 PM  Attending Patient seen and examined.I have reviewed the above documentation and have made the necessary changes. Patient has an outpatient referral with Dr Sherene Sires set up for next week. Spoke with Dr Eldridge Dace today-who paid a social visit to the patient-no further cardiac work up recommended. Patient will pursue outpatient work up with PCCM if anything more is indicated in this setting. She may benefit from PFT's. I have asked her to see if her PCP can refer her to a psychiatrist as well.  S Ghimire

## 2012-04-26 NOTE — Evaluation (Signed)
Physical Therapy Evaluation Patient Details Name: Paula Booker MRN: [id number] DOB: Mar 07, 1946 Today's Date: 04/26/2012 Time: 9147-8295 PT Time Calculation (min): 30 min  PT Assessment / Plan / Recommendation Clinical Impression  Pt is 67 y/o female with h/o cardiomyopathy with EF of 25% on ICD, asthma, GERD, hypertension, and arthritis.  Pt has had progressive shortness of breath for past few months and c/o incr dyspnea limiting her activity level and function at home.  Pt would benefit from skilled PT to address these limitations and practice energy conservation techniques.  Discussed at length energy conservation with pt who verbalizes understanding.  Also discussed benefits of outpatient PT services to improve her activity tolerance, pt agrees to follow up if needed.     PT Assessment  Patient needs continued PT services    Follow Up Recommendations  Outpatient PT       Barriers to Discharge Decreased caregiver support      Equipment Recommendations  Rolling walker with 5" wheels      Frequency Min 3X/week    Precautions / Restrictions Precautions Precautions: Fall;ICD/Pacemaker Restrictions Weight Bearing Restrictions: No   Pertinent Vitals/Pain VSS, no pain 8/10 sob consistently      Mobility  Bed Mobility Bed Mobility: Not assessed Transfers Transfers: Sit to Stand;Stand to Sit Sit to Stand: 6: Modified independent (Device/Increase time);From bed;From chair/3-in-1 Stand to Sit: 6: Modified independent (Device/Increase time);To chair/3-in-1;To bed Details for Transfer Assistance: pt utilized arm rests or bed and table to assist in stability with transfer Ambulation/Gait Ambulation/Gait Assistance: 4: Min guard Ambulation Distance (Feet): 300 Feet Assistive device: None Ambulation/Gait Assistance Details: very slow pace, intermittent stagger steps, HR and O2 sats remained stable throughout.  Pt req vc to take multiple standing and one seated break.  Pt rates  dyspnea at 8/10 consistently.  Gait Pattern: Step-through pattern;Decreased step length - right;Decreased step length - left;Decreased stride length;Decreased hip/knee flexion - right;Decreased hip/knee flexion - left;Wide base of support Patient may benefit from Rolator to assist in energy conservation and provide seated rest break when needed.  Gait velocity: decr Stairs: No Wheelchair Mobility Wheelchair Mobility: No           PT Diagnosis: Difficulty walking  PT Problem List: Decreased strength;Decreased range of motion;Decreased activity tolerance;Decreased balance;Decreased mobility PT Treatment Interventions: Gait training;Stair training;Functional mobility training;Therapeutic activities;Therapeutic exercise;Balance training   PT Goals Acute Rehab PT Goals PT Goal Formulation: With patient Time For Goal Achievement: 05/10/12 Potential to Achieve Goals: Good Pt will go Sit to Stand: Independently PT Goal: Sit to Stand - Progress: Goal set today Pt will go Stand to Sit: Independently PT Goal: Stand to Sit - Progress: Goal set today Pt will Stand: Independently PT Goal: Stand - Progress: Goal set today Pt will Ambulate: >150 feet;with modified independence;Other (comment) (with dyspnea controlled) PT Goal: Ambulate - Progress: Goal set today  Visit Information  Last PT Received On: 04/26/12 Assistance Needed: +1    Subjective Data  Subjective: I'm ok, I just get real tired  Patient Stated Goal: go home and incr independence   Prior Functioning  Home Living Lives With: Family;Other (Comment) (mother and grandson) Available Help at Discharge: Family;Available PRN/intermittently Type of Home: House Home Access: Ramped entrance Home Layout: One level Bathroom Shower/Tub: Walk-in shower;Door [employer] Toilet: Standard Home Adaptive Equipment: Grab bars in shower;Straight cane Prior Function Level of Independence: Needs assistance Needs Assistance: Meal Prep;Light  Housekeeping Meal Prep: Moderate Light Housekeeping: Moderate Driving: Yes Vocation: Retired Comments: 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  Pt is stressed and her primary concern is that she will not be able to take care of all the tasks she needs to help her mother right now. Communication Communication: No difficulties Dominant Hand: Right    Cognition  Overall Cognitive Status: Appears within functional limits for tasks assessed/performed Arousal/Alertness: Awake/alert Orientation Level: Oriented X4 / Intact Behavior During Session: [facility] for tasks performed    Extremity/Trunk Assessment Right Upper Extremity Assessment RUE ROM/Strength/Tone: [facility] for tasks assessed Left Upper Extremity Assessment LUE ROM/Strength/Tone: WFL for tasks assessed Right Lower Extremity Assessment RLE ROM/Strength/Tone: [facility] for tasks assessed Left Lower Extremity Assessment LLE ROM/Strength/Tone: WFL for tasks assessed Trunk Assessment Trunk Assessment: Normal   Balance Balance Balance Assessed: Yes Static Sitting Balance Static Sitting - Balance Support: No upper extremity supported;Feet supported Static Sitting - Level of Assistance: 7: Independent Static Sitting - Comment/# of Minutes: EOB >37min no difficulty Static Standing Balance Static Standing - Balance Support: No upper extremity supported;During functional activity Static Standing - Level of Assistance: 5: Stand by assistance Static Standing - Comment/# of Minutes: during standing breaks from ambulation, stand by assist as pt states she sometimes becomes dizzy however denies dizziness today  End of Session PT - End of Session Activity Tolerance: Patient limited by fatigue Patient left: in bed;with call bell/phone within reach Nurse Communication: Mobility status       Sharion Balloon 04/26/2012, 2:15 PM Sharion Balloon, SPT Acute Rehab Services [phone]

## 2012-04-26 NOTE — Consult Note (Signed)
PULMONARY  / CRITICAL CARE MEDICINE  Name: Paula Booker MRN: [id number] DOB: 11-08-1945    LOS: 3  REFERRING PROVIDER:  Jeoffrey Massed  CHIEF COMPLAINT:  Dyspnea  BRIEF PATIENT DESCRIPTION:  67 yo female never smoker admitted on 04/23/2012 with progressive dyspnea from CHF exacerbation and asthma exacerbation.  Significant PMHx of non-ischemic systolic CHF (EF 40%), s/p AICD, WPW s/p EPS with ablation, asthma, GERD, HTN, arthritis  TESTS: 04/20/12 V/Q scan >> very low probability for PE 04/25/12 Doppler legs >> no DVT 04/25/12 CT chest >> calcified Rt paratracheal node, calcified Rt hilum, RML ATX 04/25/12 ABG >> pH 7.45, PaCO2 39, PaO2 59.7  HISTORY OF PRESENT ILLNESS:   She has hx of asthma and CHF.  She has noticed trouble with her breathing since May 2013.  She does not recall any specific event that triggered her progressive dyspnea.  She feels like she gets smothered with her breathing.  This happens especially when she lays flat or when sleeping.  She has to sleep on several pillows.  She also gets intermittent cough and wheeze.  She tries using her inhalers, but these don't always help.  She denies chest pain, fever, sputum, hemoptysis, skin rash, gland swelling, or leg swelling.  She is from [place].  She never smoked, but several family members did.  Her father had TB, but she was never treated for TB.  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  She has been taking care of her terminally ill mother, and this has put a lot of stress on her.  PAST MEDICAL HISTORY :  Past Medical History  Diagnosis Date  . Asthma   . GERD (gastroesophageal reflux disease)   . CHF (congestive heart failure)   . Hypertension   . ICD (implantable cardiac defibrillator) in place   . Skin cancer 1999  . Shortness of breath     "@ any time I can get SOB" (03/22/2012)  . Migraines   . Arthritis     "hands and knees" (03/22/2012)   Past Surgical History  Procedure  Date  . Cardiac defibrillator placement 03/22/2012    DDD  . Cholecystectomy ~ 2003  . Tonsillectomy and adenoidectomy 1950  . Skin cancer excision 1999    "top of my head and my right back shoulder"  . Mohs surgery 06/2011    "forehead" (03/22/2012)  . Supraventricular tachycardia ablation 03/22/2012    unable to induce VT/RN report 03/22/2012   Prior to Admission medications   Medication Sig Start Date End Date Taking? Authorizing Provider  albuterol (PROVENTIL HFA;VENTOLIN HFA) 108 (90 BASE) MCG/ACT inhaler Inhale 2 puffs into the lungs every 6 (six) hours as needed. For wheezing   Yes Historical Provider, MD  carvedilol (COREG) 12.5 MG tablet Take 12.5 mg by mouth 2 (two) times daily with a meal.    Yes Historical Provider, MD  cetirizine (ZYRTEC) 10 MG tablet Take 10 mg by mouth daily.   Yes Historical Provider, MD  Cholecalciferol (VITAMIN D3) 1000 UNITS CAPS Take 1,000 Units by mouth daily.    Yes Historical Provider, MD  citalopram (CELEXA) 20 MG tablet Take 20 mg by mouth daily.   Yes Historical Provider, MD  Fluticasone-Salmeterol (ADVAIR) 250-50 MCG/DOSE AEPB Inhale 1 puff into the lungs every 12 (twelve) hours.   Yes Historical Provider, MD  furosemide (LASIX) 40 MG tablet Take 40 mg by mouth daily.   Yes Historical Provider, MD  GELATIN PO Take 13 mg by  mouth daily.   Yes Historical Provider, MD  losartan (COZAAR) 50 MG tablet Take 50 mg by mouth daily.   Yes Historical Provider, MD  Misc Natural Products (OSTEO BI-FLEX ADV DOUBLE ST PO) Take 1 tablet by mouth every morning.    Yes Historical Provider, MD  Multiple Vitamins-Minerals (MULTIVITAMIN PO) Take 1 tablet by mouth daily. ALIVE   Yes Historical Provider, MD  naproxen sodium (ALEVE) 220 MG tablet Take 440 mg by mouth daily.   Yes Historical Provider, MD  omeprazole (PRILOSEC) 20 MG capsule Take 40 mg by mouth daily.   Yes Historical Provider, MD  potassium chloride (K-DUR,KLOR-CON) 10 MEQ tablet Take 10 mEq by mouth 2  (two) times daily.   Yes Historical Provider, MD  ALPRAZolam (XANAX) 0.25 MG tablet Take 1 tablet (0.25 mg total) by mouth 3 (three) times daily as needed for anxiety. 04/26/12   Gwenyth Bender, NP  guaiFENesin-dextromethorphan (ROBITUSSIN DM) 100-10 MG/5ML syrup Take 5 mLs by mouth every 4 (four) hours as needed for cough. 04/26/12   Gwenyth Bender, NP  oxyCODONE-acetaminophen (PERCOCET/ROXICET) 5-325 MG per tablet Take 1-2 tablets by mouth every 4 (four) hours as needed. 04/26/12   Gwenyth Bender, NP  predniSONE (DELTASONE) 20 MG tablet Take 3 tabs 1/17 and 1/18 Take 2 tabs 1/19 and 1/20 Take 1 tab 1/21 and 1/22 then stop 04/26/12   Gwenyth Bender, NP   Allergies  Allergen Reactions  . Penicillins Anaphylaxis  . Sulfonamide Derivatives Other (See Comments)    Migraines  . Ivp Dye (Iodinated Diagnostic Agents) Hives  . Onion Other (See Comments)    Raw onions cause migraines    FAMILY HISTORY:  Father - TB Mother - CHF  SOCIAL HISTORY:  reports that she has never smoked. She has never used smokeless tobacco. She reports that she drinks alcohol. She reports that she does not use illicit drugs.  REVIEW OF SYSTEMS:   Negative except above.  VITAL SIGNS: Temp:  [97.6 F (36.4 C)-97.7 F (36.5 C)] 97.7 F (36.5 C) (01/16 1352) Pulse Rate:  [68-85] 84  (01/16 1352) Resp:  [18-20] 18  (01/16 1352) BP: (130-165)/(74-88) 130/74 mmHg (01/16 1352) SpO2:  [92 %-94 %] 94 % (01/16 1352) Weight:  [199 lb 15.3 oz (90.7 kg)] 199 lb 15.3 oz (90.7 kg) (01/16 1610)  PHYSICAL EXAMINATION: General:  No distress, speaks in full sentences Neuro:  Normal strength, CN intact HEENT:  No sinus tenderness, MP 3, no oral exudate, no LAN Cardiovascular:  s1s2 regular, 2/6 SM Lungs:  No wheeze/rales/dullness Abdomen:  Soft, non-tender, + bowel sounds Musculoskeletal:  No edema Skin:  No rashes   Lab 04/26/12 0715 04/25/12 1007 04/23/12 1823  NA 139 139 142  K 3.6 3.7 4.2  CL 97 97 107  CO2 30 26 24     BUN 43* 33* 15  CREATININE 0.88 0.84 0.61  GLUCOSE 182* 294* 112*    Lab 04/26/12 0715 04/25/12 1007 04/23/12 1823  HGB 14.2 13.5 12.2  HCT 41.7 39.6 36.3  WBC 17.4* 21.1* 9.1  PLT 274 241 223   Dg Chest 2 View  04/25/2012  *RADIOLOGY REPORT*  Clinical Data: Shortness of breath.  CHEST - 2 VIEW  Comparison: 01/13 and 04/20/2012  Findings: AICD in place.  Heart size and vascularity are normal. Persistent chronic elevation of the right hemidiaphragm and chronic atelectasis at the right lung base. The atelectasis at the right base has slightly increased posteriorly.  No effusions.  No acute  osseous abnormality.  IMPRESSION: Slight increased atelectasis at the right base posteriorly.   Original Report Authenticated By: Francene Boyers, M.D.    Ct Chest Wo Contrast  04/25/2012  *RADIOLOGY REPORT*  Clinical Data: Shortness of breath.  History of congestive heart failure.  CT CHEST WITHOUT CONTRAST  Technique:  Multidetector CT imaging of the chest was performed following the standard protocol without IV contrast.  Comparison: Two-view chest 04/26/2011.  Findings: Cardiac pacing wires are in place in the right atrium and right ventricle.  The heart size is normal.  There is no significant pleural or pericardial effusion.  A calcified high right paratracheal lymph node is present.  There are calcified nodes at the right hilum as well.  No other significant mediastinal or axillary adenopathy is present.  There is partial collapse of the right lower lobe and minimal atelectasis in the right middle lobe.  No obstructing lesion is evident.  The left lung is clear. A punctate calcification is present in the right upper lobe image 18 of series 3.  No other focal nodule, mass, or airspace disease is present.  The bone windows demonstrate to minimal degenerative change in the thoracic spine.  IMPRESSION:  1.  Mild volume loss at the right lung base likely represents atelectasis. 2.  Minimal atelectasis in the right  middle lobe. 3.  Cardiac pacing wires as described. 4.  No acute or focal abnormality to explain shortness of breath. 5.  History of granulomatous disease in the right upper lobe.   Original Report Authenticated By: Marin Roberts, M.D.     ASSESSMENT / PLAN:  67 yo female with progressive dyspnea in setting of non-ischemic CM and hx of asthma.  May also have component of deconditioning.  Asthma. Improved with current regimen. Plan: Resume advair Albuterol as needed Wean off prednisone as tolerated Will need PFT's as outpt  Possible sleep apnea. Snoring, sleep disruption, and daytime sleepiness with hx of cardiovascular disease. Plan: Will need to arrange for in lab Split night sleep study as outpt  Non-ischemic CM. Plan: Per primary team and cardiology  Rt hilar/paratracheal calcification. She reports family hx of TB. Plan: Monitor clinically  She is being d/c hom today.  I have scheduled her for follow up visit with Tammy Parrett in pulmonary office for Friday, January 24 at 415 pm.   Will then arrange for PFT's and split night sleep study to further assess dyspnea and possible sleep apnea.  Will then need f/u with me after testing completed.  Coralyn Helling, MD [facility] Pulmonary/Critical Care 04/26/2012, 2:48 PM Pager:  [phone] After 3pm call: [phone]

## 2012-04-26 NOTE — Evaluation (Signed)
Patrina Andreas Ingold,PT Acute Rehabilitation [phone] (pager)  

## 2012-04-26 NOTE — Progress Notes (Signed)
Inpatient Diabetes Program Recommendations  AACE/ADA: New Consensus Statement on Inpatient Glycemic Control (2013)  Target Ranges:  Prepandial:   less than 140 mg/dL      Peak postprandial:   less than 180 mg/dL (1-2 hours)      Critically ill patients:  140 - 180 mg/dL   Reason for Visit: Results for Paula Booker, Paula Booker (MRN [id number]) as of 04/26/2012 10:58  Ref. Range 04/25/2012 10:07 04/26/2012 07:15  Glucose Latest Range: 70-99 mg/dL 528 (H) 413 (H)   No prior history of diabetes.  Lab glucose is elevated.  Note patient is on steroids.  Please consider ordering Novolog correction moderate tid wc and HS scale.  Also order A1C to determine past 2-3 months of glycemic control.  Will follow.

## 2012-04-28 NOTE — ED Provider Notes (Signed)
Medical screening examination/treatment/procedure(s) were performed by resident physician or non-physician practitioner and as supervising physician I was immediately available for consultation/collaboration.   Carlye Panameno DOUGLAS MD.    Loetta Connelley D Lenoria Narine, MD 04/28/12 1724 

## 2012-05-01 ENCOUNTER — Ambulatory Visit: Payer: Medicare Other | Admitting: Physical Therapy

## 2012-05-01 NOTE — Consult Note (Addendum)
Admit date: 04/23/2012 Referring Physician  Dr. Jerral Ralph Primary Cardiologist  Eldridge Dace Reason for Consultation  Shortness of breath  HPI: 67 y/o woman with a nonischemic cardiomyopathy, s/p AICD a few weeks ago.  She came to our office last Friday with worsening shortness of breath.  CXR showed no pulmonary edema.  BNP was normal.  Limited echo showed reduced LV function consistent with prior and no pericardial effusion.  D-dimer was negative but V/Q was low probability for PE.  We felt that all acute, lifethreatening causes of her [facility] were ruled out.  She came to the hospital with persistent dyspnea.  Workup has been essentially negative except for an A-a gradient on ABG.     PMH:   Past Medical History  Diagnosis Date  . Asthma   . GERD (gastroesophageal reflux disease)   . CHF (congestive heart failure)   . Hypertension   . ICD (implantable cardiac defibrillator) in place   . Skin cancer 1999  . Shortness of breath     "@ any time I can get SOB" (03/22/2012)  . Migraines   . Arthritis     "hands and knees" (03/22/2012)     PSH:   Past Surgical History  Procedure Date  . Cardiac defibrillator placement 03/22/2012    DDD  . Cholecystectomy ~ 2003  . Tonsillectomy and adenoidectomy 1950  . Skin cancer excision 1999    "top of my head and my right back shoulder"  . Mohs surgery 06/2011    "forehead" (03/22/2012)  . Supraventricular tachycardia ablation 03/22/2012    unable to induce VT/RN report 03/22/2012    Allergies:  Penicillins; Sulfonamide derivatives; Ivp dye; and Onion Prior to Admit Meds:   No prescriptions prior to admission   Fam HX:   History reviewed. No pertinent family history. Social HX:    History   Social History  . Marital Status: Single    Spouse Name: N/A    Number of Children: N/A  . Years of Education: N/A   Occupational History  . Not on file.   Social History Main Topics  . Smoking status: Never Smoker   . Smokeless tobacco: Never Used   . Alcohol Use: Yes     Comment: 03/22/2012 "mixed drink or beer q 2-3 months or so"  . Drug Use: No  . Sexually Active: Not Currently   Other Topics Concern  . Not on file   Social History Narrative  . No narrative on file     ROS:  All 11 ROS were addressed and are negative except what is stated in the HPI  Physical Exam: Blood pressure 130/74, pulse 84, temperature 97.7 F (36.5 C), temperature source Oral, resp. rate 18, height 5\' 4"  (1.626 m), weight 90.7 kg (199 lb 15.3 oz), SpO2 94.00%.    General: Well developed, well nourished, in no acute distress Head:    Normal cephalic and atramatic  Lungs:   Clear bilaterally to auscultation and percussion. Heart: HRRR S1 S2  No JVD.  Abdomen: , abdomen soft and non-tender Msk:  . Normal strength and tone for age. Extremities:   No edema.   Neuro: Alert and oriented X 3. Psych:  Normal affect, responds appropriately    Labs:   Lab Results  Component Value Date   WBC 17.4* 04/26/2012   HGB 14.2 04/26/2012   HCT 41.7 04/26/2012   MCV 91.2 04/26/2012   PLT 274 04/26/2012    Lab 04/26/12 0715  NA 139  K  3.6  CL 97  CO2 30  BUN 43*  CREATININE 0.88  CALCIUM 9.9  PROT --  BILITOT --  ALKPHOS --  ALT --  AST --  GLUCOSE 182*   No results found for this basename: PTT   Lab Results  Component Value Date   INR 1.00 04/23/2012   INR 0.89 03/29/2012   INR 1.01 10/27/2011   Lab Results  Component Value Date   TROPONINI <0.30 04/23/2012     No results found for this basename: CHOL   No results found for this basename: HDL   No results found for this basename: LDLCALC   No results found for this basename: TRIG   No results found for this basename: CHOLHDL   No results found for this basename: LDLDIRECT      Radiology:  No results found.  EKG:  NSR, PACs, PRWP, NSST  ASSESSMENT: Nonischemic cardiomyopathy  PLAN:  No cardiac cause of dyspnea found.  No further cardiac w/u needed.  SHe had cath in 6/13.     Suggest pulmonary w/u for A-a gradient.  Consider anxiety/stress as a cause since her mother is currently hospitalized, and she admits that this is a great cause of stress.  SHe also has had issues with her siblings regarding the mother's care.  Corky Crafts., MD  05/01/2012  4:27 PM

## 2012-05-03 ENCOUNTER — Institutional Professional Consult (permissible substitution): Payer: Medicare Other | Admitting: Internal Medicine

## 2012-05-04 ENCOUNTER — Inpatient Hospital Stay: Payer: Medicare Other | Admitting: Adult Health

## 2012-05-08 ENCOUNTER — Encounter: Payer: Self-pay | Admitting: Internal Medicine

## 2012-05-08 ENCOUNTER — Ambulatory Visit: Payer: Medicare Other | Attending: Internal Medicine | Admitting: Physical Therapy

## 2012-05-10 ENCOUNTER — Encounter: Payer: Self-pay | Admitting: Internal Medicine

## 2012-05-10 ENCOUNTER — Ambulatory Visit (INDEPENDENT_AMBULATORY_CARE_PROVIDER_SITE_OTHER): Payer: Medicare Other | Admitting: Internal Medicine

## 2012-05-10 VITALS — BP 110/70 | HR 82 | Temp 97.0°F | Ht 64.0 in | Wt 208.0 lb

## 2012-05-10 DIAGNOSIS — R0602 Shortness of breath: Secondary | ICD-10-CM

## 2012-05-10 MED ORDER — MOMETASONE FURO-FORMOTEROL FUM 100-5 MCG/ACT IN AERO: INHALATION_SPRAY | RESPIRATORY_TRACT | Status: DC

## 2012-05-10 NOTE — Progress Notes (Signed)
Subjective:    Patient ID: Paula Booker, female    DOB: 1945-06-09   MRN: [id number]  HPI  75 yowf never smoked able to run 6 min mile at age 67 at  Wt=132 new onset sob May 2013 > cardiac w/u by Althea Charon with ICD placed in 12-28-14p "sudden death" x 3 > referred by Dr Jillyn Hidden 05/10/2012 for pulmonary eval for unexplained resting sob.   05/10/2012 1st pulmonary eval dx sob variably present 2009 (2008 wt= 180)  rx inhaler= ventolin helped some nebulizer helped more but this was sporadic,  Could go without either for weeks at a time so in jan 2013 placed advair and less need for albuterol,  Esp reduced the need for alb  right after lying down.  At  Baseline tolerated  lots of yardwork and housework but then May 2013 developed a choking sensation esp when lying down and ultimately dx'd with chf but never improved the noct symptoms with rx for chf and presently sob if talking,  Immediately on lying down chokes, so can only sleep sitting up,   No obvious pattern to  daytime variabilty or assoc excess mucus prod or cp or chest tightness, subjective wheeze overt sinus or hb symptoms. No unusual exp hx or h/o childhood pna/ asthma or premature birth to herknowledge.     Also denies any obvious fluctuation of symptoms with weather or environmental changes or other aggravating or alleviating factors except as outlined above      Review of Systems  Constitutional: Positive for appetite change. Negative for fever and unexpected weight change.  HENT: Negative for ear pain, nosebleeds, congestion, sore throat, rhinorrhea, sneezing, trouble swallowing, dental problem, postnasal drip and sinus pressure.   Eyes: Negative for redness and itching.  Respiratory: Positive for shortness of breath. Negative for cough, chest tightness and wheezing.   Cardiovascular: Positive for chest pain. Negative for palpitations and leg swelling.  Gastrointestinal: Negative for nausea and vomiting.  Genitourinary: Negative  for dysuria.  Musculoskeletal: Negative for joint swelling.  Skin: Negative for rash.  Neurological: Negative for headaches.  Hematological: Does not bruise/bleed easily.  Psychiatric/Behavioral: Negative for dysphoric mood. The patient is not nervous/anxious.        Objective:   Physical Exam   Anxious wf with classic voice fatigue > high pitched sqeaky voice p saying more than 5 words  Wt Readings from Last 3 Encounters:  05/10/12 208 lb (94.348 kg)  04/26/12 199 lb 15.3 oz (90.7 kg)  03/23/12 203 lb 11.3 oz (92.4 kg)   HEENT: nl dentition, turbinates, and orophanx. Nl external ear canals without cough reflex   NECK :  without JVD/Nodes/TM/ nl carotid upstrokes bilaterally   LUNGS: no acc muscle use, clear to A and P bilaterally without cough on insp or exp maneuvers   CV:  RRR  no s3 or murmur or increase in P2, no edema   ABD:  soft and nontender with nl excursion in the supine position. No bruits or organomegaly, bowel sounds nl  MS:  warm without deformities, calf tenderness, cyanosis or clubbing  SKIN: warm and dry without lesions    NEURO:  alert, approp, no deficits   V/q 04/21/12 Very low prob  cxr 04/25/12 Comparison: 01/13 and 04/20/2012  Findings: AICD in place. Heart size and vascularity are normal.  Persistent chronic elevation of the right hemidiaphragm and chronic  atelectasis at the right lung base. The atelectasis at the right  base has slightly increased posteriorly. No  effusions. No acute  osseous abnormality.  IMPRESSION:  Slight increased atelectasis at the right base posteriorly.      Assessment & Plan:

## 2012-05-10 NOTE — Patient Instructions (Addendum)
Prilosec 40mg  Take 30-60 min before first meal of the day and Pepcid 20 mg at bedtime  Stop Advair  Start dulera 100 Take 2 puffs first thing in am and then another 2 puffs about 12 hours later   Only use your albuterol(ventolin) as a rescue medication to be used if you can't catch your breath by resting or doing a relaxed purse lip breathing pattern. The less you use it, the better it will work when you need it.   GERD (REFLUX)  is an extremely common cause of respiratory symptoms, many times with no significant heartburn at all.    It can be treated with medication, but also with lifestyle changes including avoidance of late meals, excessive alcohol, smoking cessation, and avoid fatty foods, chocolate, peppermint, colas, red wine, and acidic juices such as orange juice.  NO MINT OR MENTHOL PRODUCTS SO NO COUGH DROPS  USE SUGARLESS CANDY INSTEAD (jolley ranchers or Stover's)  NO OIL BASED VITAMINS - use powdered substitutes.   Please schedule a follow up office visit in 2 weeks, sooner if needed

## 2012-05-11 ENCOUNTER — Telehealth: Payer: Self-pay | Admitting: Internal Medicine

## 2012-05-11 NOTE — Telephone Encounter (Signed)
I spoke with pt. Aware 1 sample of dulera left upfront for p/u. Nothing further was needed

## 2012-05-11 NOTE — Assessment & Plan Note (Signed)
-   05/10/2012  Walked RA  2 laps @ 185 ft each stopped due to fatigue > sob, no desat  - Spirometry c/w restriction only 05/10/2012   Unable to reproduce this with walking in office despite resting sob so clearly symptoms are   disproportionate to objective findings and  stongly doubt this is a lung problem but pt does appear to have difficult airway management issues. DDX of  difficult airways managment all start with A and  include Adherence, Ace Inhibitors, Acid Reflux, Active Sinus Disease, Alpha 1 Antitripsin deficiency, Anxiety masquerading as Airways dz,  ABPA,  allergy(esp in young), Aspiration (esp in elderly), Adverse effects of DPI,  Active smokers, plus two Bs  = Bronchiectasis and Beta blocker use..and one C= CHF   Adherence is always the initial "prime suspect" and is a multilayered concern that requires a "trust but verify" approach in every patient - starting with knowing how to use medications, especially inhalers, correctly, keeping up with refills and understanding the fundamental difference between maintenance and prns vs those medications only taken for a very short course and then stopped and not refilled. The proper method of use, as well as anticipated side effects, of a metered-dose inhaler are discussed and demonstrated to the patient. Improved effectiveness after extensive coaching during this visit to a level of approximately  75% so try dulera 100 2bid as has failed advair and may have asthma  ? Acid reflux > a nice unifying dx which may explain late onset asthma assoc with substantial wt gain as she has aged vs her baseline.  ? Anxiety> usually a dx of exclusiong by high on the list here  ? Beta blocker effect > .Strongly prefer in this setting: Bystolic, the most beta -1  selective Beta blocker available in sample form, with bisoprolol the most selective generic choice  on the market, but will defer this to cards in absence of more convincing evidence of airflow osbruction  that what we're seeing today.    ? chf > strongly doubt based on improvement in bnp and cxr lacking evidence of chf now though clearly needs ongoing aggressive rx.

## 2012-05-24 ENCOUNTER — Ambulatory Visit: Payer: Medicare Other | Admitting: Internal Medicine

## 2012-06-05 ENCOUNTER — Ambulatory Visit (INDEPENDENT_AMBULATORY_CARE_PROVIDER_SITE_OTHER): Payer: Medicare Other | Admitting: Internal Medicine

## 2012-06-05 ENCOUNTER — Encounter: Payer: Self-pay | Admitting: Internal Medicine

## 2012-06-05 VITALS — BP 130/78 | HR 76 | Temp 97.9°F | Ht 64.0 in | Wt 208.8 lb

## 2012-06-05 DIAGNOSIS — R0602 Shortness of breath: Secondary | ICD-10-CM

## 2012-06-05 MED ORDER — FAMOTIDINE 20 MG PO TABS: ORAL_TABLET | ORAL | Status: DC

## 2012-06-05 MED ORDER — MOMETASONE FURO-FORMOTEROL FUM 100-5 MCG/ACT IN AERO: INHALATION_SPRAY | RESPIRATORY_TRACT | Status: DC

## 2012-06-05 NOTE — Patient Instructions (Addendum)
Rechallenge dulera 100 Take 2 puffs first thing in am and then another 2 puffs about 12 hours later- if you're convinced you're better, fill the rx  Continue on acid suppression and diet  Please schedule a follow up office visit in 6 weeks, call sooner if needed with pfts on return

## 2012-06-05 NOTE — Assessment & Plan Note (Addendum)
-   05/10/2012  Walked RA  2 laps @ 185 ft each stopped due to fatigue > sob, no desat  - Spirometry c/w restriction only 05/10/2012  - Dulera 100 trial 05/10/2012 >>> - hfa 75% 06/05/2012 ? Better or not p dulera > rechallenge  I had an extended discussion with the patient today lasting 15 to 20 minutes of a 25 minute visit on the following issues: Unlike when you get a prescription for eyeglasses, it's not possible to always walk out of this or any medical office with a perfect prescription that is immediately effective  based on any test that we offer here.    On the contrary, it may take several weeks for the full impact of changes recommened today - hopefully you will respond well.  If not, then we'll adjust your medication on your next visit accordingly, knowing more then than we can possibly know now.      See instructions for specific recommendations which were reviewed directly with the patient who was given a copy with highlighter outlining the key components.   The proper method of use, as well as anticipated side effects, of a metered-dose inhaler are discussed and demonstrated to the patient. Improved effectiveness after extensive coaching during this visit to a level of approximately  75%

## 2012-06-05 NOTE — Progress Notes (Signed)
Subjective:    Patient ID: Paula Booker, female    DOB: 1945/10/06   MRN: [id number]  HPI  67 yowf never smoked able to run 6 min mile at age 67 at  Wt=132 new onset sob May 2013 > cardiac w/u by Althea Charon with ICD placed in 12-Dec-2014p "sudden death" x 3 > referred by Dr Jillyn Hidden 05/10/2012 for pulmonary eval for unexplained resting sob.   05/10/2012 1st pulmonary eval dx sob variably present 2009 (2008 wt= 180)  rx inhaler= ventolin helped some nebulizer helped more but this was sporadic,  Could go without either for weeks at a time so in jan 2013 placed advair and less need for albuterol,  Esp reduced the need for alb  right after lying down. rec Prilosec 40mg  Take 30-60 min before first meal of the day and Pepcid 20 mg at bedtime Stop Advair Start dulera 100 Take 2 puffs first thing in am and then another 2 puffs about 12 hours later  Only use your albuterol(ventolin) as a rescue medication GERD  06/05/2012 f/u ov/Terelle Dobler cc sob much better with only using ventolin a few times a week.  Still sitting up due to sensation of smothering immediately so sleeps slouching over forward.      No obvious pattern to  daytime variabilty or assoc excess mucus prod or cp or chest tightness, subjective wheeze overt sinus or hb symptoms. No unusual exp hx or h/o childhood pna/ asthma or premature birth to her knowledge.     Also denies any obvious fluctuation of symptoms with weather or environmental changes or other aggravating or alleviating factors except as outlined above   .ROS  The following are not active complaints unless bolded sore throat, dysphagia, dental problems, itching, sneezing,  nasal congestion or excess/ purulent secretions, ear ache,   fever, chills, sweats, unintended wt loss, pleuritic or exertional cp, hemoptysis,  orthopnea pnd or leg swelling, presyncope, palpitations, heartburn, abdominal pain, anorexia, nausea, vomiting, diarrhea  or change in bowel or urinary habits, change in  stools or urine, dysuria,hematuria,  rash, arthralgias, visual complaints, headache, numbness weakness or ataxia or problems with walking or coordination,  change in mood/affect or memory.               Objective:   Physical Exam   Pleasant wf  nad immediate choking lying flat but no stridor (no air movement at all)   Wt  208 06/05/2012  Wt Readings from Last 3 Encounters:  05/10/12 208 lb (94.348 kg)  04/26/12 199 lb 15.3 oz (90.7 kg)  03/23/12 203 lb 11.3 oz (92.4 kg)   HEENT: nl dentition, turbinates, and orophanx. Nl external ear canals without cough reflex   NECK :  without JVD/Nodes/TM/ nl carotid upstrokes bilaterally   LUNGS: no acc muscle use, clear to A and P bilaterally without cough on insp or exp maneuvers   CV:  RRR  no s3 or murmur or increase in P2, no edema   ABD:  soft and nontender with nl excursion in the supine position. No bruits or organomegaly, bowel sounds nl  MS:  warm without deformities, calf tenderness, cyanosis or clubbing  SKIN: warm and dry without lesions    NEURO:  alert, approp, no deficits   V/q 04/21/12 Very low prob  cxr 04/25/12 Comparison: 01/13 and 04/20/2012  Findings: AICD in place. Heart size and vascularity are normal.  Persistent chronic elevation of the right hemidiaphragm and chronic  atelectasis at the right lung base.  The atelectasis at the right  base has slightly increased posteriorly. No effusions. No acute  osseous abnormality.  IMPRESSION:  Slight increased atelectasis at the right base posteriorly.      Assessment & Plan:

## 2012-06-11 ENCOUNTER — Encounter (HOSPITAL_COMMUNITY): Payer: Self-pay

## 2012-06-13 ENCOUNTER — Encounter (HOSPITAL_COMMUNITY): Payer: Self-pay

## 2012-06-15 ENCOUNTER — Encounter (HOSPITAL_COMMUNITY): Payer: Self-pay

## 2012-06-18 ENCOUNTER — Encounter (HOSPITAL_COMMUNITY): Payer: Self-pay

## 2012-06-20 ENCOUNTER — Encounter (HOSPITAL_COMMUNITY): Payer: Self-pay

## 2012-06-22 ENCOUNTER — Encounter (HOSPITAL_COMMUNITY): Payer: Self-pay

## 2012-06-25 ENCOUNTER — Encounter (HOSPITAL_COMMUNITY): Payer: Self-pay | Attending: Interventional Cardiology

## 2012-06-27 ENCOUNTER — Encounter (HOSPITAL_COMMUNITY): Payer: Self-pay

## 2012-06-29 ENCOUNTER — Encounter (HOSPITAL_COMMUNITY): Payer: Self-pay

## 2012-07-02 ENCOUNTER — Encounter (HOSPITAL_COMMUNITY): Payer: Self-pay

## 2012-07-03 ENCOUNTER — Encounter: Payer: Self-pay | Admitting: Cardiology

## 2012-07-03 ENCOUNTER — Ambulatory Visit (INDEPENDENT_AMBULATORY_CARE_PROVIDER_SITE_OTHER): Payer: Medicare Other | Admitting: Cardiology

## 2012-07-03 VITALS — BP 128/78 | HR 81 | Ht 64.0 in | Wt 205.8 lb

## 2012-07-03 DIAGNOSIS — I428 Other cardiomyopathies: Secondary | ICD-10-CM

## 2012-07-03 DIAGNOSIS — Z95810 Presence of automatic (implantable) cardiac defibrillator: Secondary | ICD-10-CM

## 2012-07-03 DIAGNOSIS — I5023 Acute on chronic systolic (congestive) heart failure: Secondary | ICD-10-CM

## 2012-07-03 DIAGNOSIS — I509 Heart failure, unspecified: Secondary | ICD-10-CM

## 2012-07-03 DIAGNOSIS — I5022 Chronic systolic (congestive) heart failure: Secondary | ICD-10-CM

## 2012-07-03 NOTE — Patient Instructions (Addendum)
Your physician wants you to follow-up in: December with Dr Taylor. You will receive a reminder letter in the mail two months in advance. If you don't receive a letter, please call our office to schedule the follow-up appointment.  

## 2012-07-04 ENCOUNTER — Encounter (HOSPITAL_COMMUNITY): Payer: Self-pay

## 2012-07-06 ENCOUNTER — Encounter (HOSPITAL_COMMUNITY): Payer: Self-pay

## 2012-07-09 ENCOUNTER — Encounter (HOSPITAL_COMMUNITY): Payer: Self-pay

## 2012-07-11 ENCOUNTER — Encounter (HOSPITAL_COMMUNITY): Payer: Medicare Other | Attending: Interventional Cardiology

## 2012-07-11 ENCOUNTER — Encounter: Payer: Self-pay | Admitting: Cardiology

## 2012-07-11 NOTE — Progress Notes (Signed)
ELECTROPHYSIOLOGY OFFICE NOTE  Patient ID: Paula Booker MRN: [id number], DOB/AGE: April 18, 1945   Date of Visit: 07/11/2012  Primary Physician: Cain Saupe, MD Primary Cardiologist: Eldridge Dace, MD Reason for Visit: EP/device follow-up  History of Present Illness  Paula Booker is a 67 year old woman with a NICM, EF 30%, WPW pattern on ECG, syncope and palpitations who underwent EPS (negative for inducible arrhythmias) and dual chamber ICD implantation back in December 2013 who now presents today for routine electrophysiology followup. She reports she is doing well. Today, she specifically denies chest pain or shortness of breath. she denies palpitations, dizziness, near syncope or syncope. She denies LE swelling, orthopnea, PND or recent weight gain. [name] reports  she is compliant and tolerating medications without difficulty.  Past Medical History Past Medical History  Diagnosis Date  . Asthma   . GERD (gastroesophageal reflux disease)   . CHF (congestive heart failure)   . Hypertension   . ICD (implantable cardiac defibrillator) in place Dec 2013  . Skin cancer 1999  . Shortness of breath     "@ any time I can get SOB" (03/22/2012)  . Migraines   . Arthritis     "hands and knees" (03/22/2012)    Past Surgical History Past Surgical History  Procedure Laterality Date  . Cardiac defibrillator placement  03/22/2012    DDD  . Cholecystectomy  ~ 2003  . Tonsillectomy and adenoidectomy  1950  . Skin cancer excision  1999    "top of my head and my right back shoulder"  . Mohs surgery  06/2011    "forehead" (03/22/2012)  . Supraventricular tachycardia ablation  03/22/2012    unable to induce VT/RN report 03/22/2012     Allergies/Intolerances Allergies  Allergen Reactions  . Penicillins Anaphylaxis  . Sulfonamide Derivatives Other (See Comments)    Migraines  . Ivp Dye (Iodinated Diagnostic Agents) Hives  . Onion Other (See Comments)    Raw onions cause migraines     Current Home Medications Current Outpatient Prescriptions  Medication Sig Dispense Refill  . albuterol (PROVENTIL HFA;VENTOLIN HFA) 108 (90 BASE) MCG/ACT inhaler Inhale 2 puffs into the lungs every 6 (six) hours as needed. For wheezing      . ALPRAZolam (XANAX) 0.25 MG tablet Take 1 tablet (0.25 mg total) by mouth 3 (three) times daily as needed for anxiety.  15 tablet  0  . carvedilol (COREG) 12.5 MG tablet Take 12.5 mg by mouth 2 (two) times daily with a meal.       . cetirizine (ZYRTEC) 10 MG tablet Take 10 mg by mouth daily.      . Cholecalciferol (VITAMIN D3) 1000 UNITS CAPS Take 1,000 Units by mouth daily.       . citalopram (CELEXA) 20 MG tablet Take 20 mg by mouth daily.      . famotidine (PEPCID) 20 MG tablet One at bedtime  30 tablet  11  . furosemide (LASIX) 40 MG tablet Take 40 mg by mouth daily.      Marland Kitchen guaiFENesin-dextromethorphan (ROBITUSSIN DM) 100-10 MG/5ML syrup Take 5 mLs by mouth every 4 (four) hours as needed for cough.  118 mL  0  . losartan (COZAAR) 50 MG tablet Take 50 mg by mouth daily.      . Misc Natural Products (OSTEO BI-FLEX ADV DOUBLE ST PO) Take 1 tablet by mouth every morning.       . mometasone-formoterol (DULERA) 100-5 MCG/ACT AERO Take 2 puffs first thing in am  and then another 2 puffs about 12 hours later.  1 Inhaler  11  . Multiple Vitamin (MULTIVITAMIN) tablet Take 1 tablet by mouth daily.      . Multiple Vitamins-Minerals (MULTIVITAMIN PO) Take 1 tablet by mouth daily. ALIVE      . naproxen sodium (ALEVE) 220 MG tablet Take 440 mg by mouth daily.      Marland Kitchen omeprazole (PRILOSEC) 20 MG capsule Take 40 mg by mouth daily.      Marland Kitchen oxyCODONE-acetaminophen (PERCOCET/ROXICET) 5-325 MG per tablet Take 1-2 tablets by mouth every 4 (four) hours as needed.  18 tablet  0  . potassium chloride (K-DUR,KLOR-CON) 10 MEQ tablet Take 10 mEq by mouth 2 (two) times daily.      Marland Kitchen GELATIN PO Take 13 mg by mouth daily.       No current facility-administered medications for  this visit.    Social History Social History  . Marital Status: Single   Occupational History  . retired     Airline pilot   Social History Main Topics  . Smoking status: Never Smoker   . Smokeless tobacco: Never Used  . Alcohol Use: Yes     Comment: 03/22/2012 "mixed drink or beer q 2-3 months or so"  . Drug Use: No   Review of Systems General: No chills, fever, night sweats or weight changes Cardiovascular: No chest pain, dyspnea on exertion, edema, orthopnea, palpitations, paroxysmal nocturnal dyspnea Dermatological: No rash, lesions or masses Respiratory: No cough, dyspnea Urologic: No hematuria, dysuria Abdominal: No nausea, vomiting, diarrhea, bright red blood per rectum, melena, or hematemesis Neurologic: No visual changes, weakness, changes in mental status All other systems reviewed and are otherwise negative except as noted above.  Physical Exam Blood pressure 128/78, pulse 81, height 5\' 4"  (1.626 m), weight 205 lb 12.8 oz (93.35 kg).  General: Well developed, well appearing 67 year old female in no acute distress. HEENT: Normocephalic, atraumatic. EOMs intact. Sclera nonicteric. Oropharynx clear.  Neck: Supple without bruits. No JVD. Lungs: Respirations regular and unlabored, CTA bilaterally. No wheezes, rales or rhonchi. Heart: RRR. S1, S2 present. No murmurs, rub, S3 or S4. Abdomen: Soft, non-distended.  Extremities: No clubbing, cyanosis or edema. DP/PT/Radials 2+ and equal bilaterally. Psych: Normal affect. Neuro: Alert and oriented X 3. Moves all extremities spontaneously.   Diagnostics Device interrogation today - Normal device function. Thresholds and sensing consistent with previous device measurements. Impedance trends stable over time. No evidence of any ventricular arrhythmias. No mode switches. Histogram distribution appropriate for patient and level of activity. OptiVol reviewed and stable. No changes made this session. Device programmed at appropriate  safety margins. Device programmed to optimize intrinsic conduction. Estimated longevity 10.9 years.   Assessment and Plan 1. NICM, EF 30%, s/p dual chamber ICD implantation for primary prevention SCD December 2013 Normal ICD function No programming changes Continue routine remote device follow-up every 3 months Return to clinic for follow-up with Dr. Ladona Ridgel in December 2014 (f/u 1 year post implant) 2. WPW pattern on ECG - negative EPS December 2013 3. Chronic systolic HF Stable; euvolemic by exam OptiVol reviewed and stable Continue medical therapy  Signed, Rick Duff, PA-C 07/11/2012, 2:40 PM

## 2012-07-12 LAB — ICD DEVICE OBSERVATION
AL IMPEDENCE ICD: 513 Ohm
AL THRESHOLD: 0.5 V
ATRIAL PACING ICD: 5.5 pct
HV IMPEDENCE: 73 Ohm
RV LEAD AMPLITUDE: 8.9 mv
RV LEAD THRESHOLD: 0.5 V
VENTRICULAR PACING ICD: 0 pct
VF: 0

## 2012-07-13 ENCOUNTER — Encounter (HOSPITAL_COMMUNITY): Payer: Medicare Other

## 2012-07-16 ENCOUNTER — Encounter (HOSPITAL_COMMUNITY): Payer: Medicare Other

## 2012-07-18 ENCOUNTER — Encounter (HOSPITAL_COMMUNITY): Payer: Medicare Other

## 2012-07-19 ENCOUNTER — Ambulatory Visit: Payer: Medicare Other | Admitting: Internal Medicine

## 2012-07-20 ENCOUNTER — Encounter (HOSPITAL_COMMUNITY): Payer: Medicare Other

## 2012-07-23 ENCOUNTER — Encounter (HOSPITAL_COMMUNITY): Payer: Medicare Other

## 2012-07-25 ENCOUNTER — Encounter (HOSPITAL_COMMUNITY): Payer: Medicare Other

## 2012-07-27 ENCOUNTER — Encounter (HOSPITAL_COMMUNITY): Payer: Medicare Other

## 2012-07-30 ENCOUNTER — Encounter (HOSPITAL_COMMUNITY): Payer: Medicare Other

## 2012-08-01 ENCOUNTER — Encounter (HOSPITAL_COMMUNITY): Payer: Medicare Other

## 2012-08-03 ENCOUNTER — Encounter (HOSPITAL_COMMUNITY): Payer: Medicare Other

## 2012-08-06 ENCOUNTER — Encounter (HOSPITAL_COMMUNITY): Payer: Medicare Other

## 2012-08-08 ENCOUNTER — Encounter: Payer: Self-pay | Admitting: Internal Medicine

## 2012-08-08 ENCOUNTER — Encounter (HOSPITAL_COMMUNITY): Payer: Medicare Other

## 2012-08-10 ENCOUNTER — Encounter (HOSPITAL_COMMUNITY): Payer: Medicare Other

## 2012-08-13 ENCOUNTER — Encounter (HOSPITAL_COMMUNITY): Payer: Medicare Other

## 2012-08-15 ENCOUNTER — Encounter (HOSPITAL_COMMUNITY): Payer: Medicare Other

## 2012-08-17 ENCOUNTER — Encounter (HOSPITAL_COMMUNITY): Payer: Medicare Other

## 2012-08-20 ENCOUNTER — Encounter (HOSPITAL_COMMUNITY): Payer: Medicare Other

## 2012-08-22 ENCOUNTER — Encounter (HOSPITAL_COMMUNITY): Payer: Medicare Other

## 2012-08-24 ENCOUNTER — Encounter (HOSPITAL_COMMUNITY): Payer: Medicare Other

## 2012-08-27 ENCOUNTER — Encounter (HOSPITAL_COMMUNITY): Payer: Medicare Other

## 2012-08-29 ENCOUNTER — Encounter (HOSPITAL_COMMUNITY): Payer: Medicare Other

## 2012-08-31 ENCOUNTER — Encounter (HOSPITAL_COMMUNITY): Payer: Medicare Other

## 2012-09-03 ENCOUNTER — Encounter (HOSPITAL_COMMUNITY): Payer: Medicare Other

## 2012-09-05 ENCOUNTER — Encounter (HOSPITAL_COMMUNITY): Payer: Medicare Other

## 2012-09-07 ENCOUNTER — Encounter (HOSPITAL_COMMUNITY): Payer: Medicare Other

## 2012-09-10 ENCOUNTER — Encounter (HOSPITAL_COMMUNITY): Payer: Medicare Other

## 2012-09-12 ENCOUNTER — Encounter (HOSPITAL_COMMUNITY): Payer: Medicare Other

## 2012-09-14 ENCOUNTER — Encounter (HOSPITAL_COMMUNITY): Payer: Medicare Other

## 2012-09-17 ENCOUNTER — Encounter (HOSPITAL_COMMUNITY): Payer: Medicare Other

## 2012-09-18 ENCOUNTER — Other Ambulatory Visit: Payer: Self-pay | Admitting: Physician Assistant

## 2012-09-19 ENCOUNTER — Encounter (HOSPITAL_COMMUNITY): Payer: Medicare Other

## 2012-09-20 ENCOUNTER — Ambulatory Visit: Payer: Medicare Other | Admitting: Internal Medicine

## 2012-09-21 ENCOUNTER — Encounter (HOSPITAL_COMMUNITY): Payer: Medicare Other

## 2012-09-21 ENCOUNTER — Ambulatory Visit: Payer: Medicare Other | Admitting: Internal Medicine

## 2012-09-24 ENCOUNTER — Encounter (HOSPITAL_COMMUNITY): Payer: Medicare Other

## 2012-09-26 ENCOUNTER — Encounter (HOSPITAL_COMMUNITY): Payer: Medicare Other

## 2012-09-28 ENCOUNTER — Encounter (HOSPITAL_COMMUNITY): Payer: Medicare Other

## 2012-10-01 ENCOUNTER — Encounter (HOSPITAL_COMMUNITY): Payer: Medicare Other

## 2012-10-03 ENCOUNTER — Encounter (HOSPITAL_COMMUNITY): Payer: Medicare Other

## 2012-10-05 ENCOUNTER — Encounter (HOSPITAL_COMMUNITY): Payer: Medicare Other

## 2012-10-08 ENCOUNTER — Encounter (HOSPITAL_COMMUNITY): Payer: Medicare Other

## 2012-10-17 ENCOUNTER — Ambulatory Visit (INDEPENDENT_AMBULATORY_CARE_PROVIDER_SITE_OTHER): Payer: Medicare Other | Admitting: Internal Medicine

## 2012-10-17 ENCOUNTER — Encounter: Payer: Self-pay | Admitting: Internal Medicine

## 2012-10-17 VITALS — BP 104/60 | HR 70 | Temp 97.2°F | Ht 63.0 in | Wt 204.0 lb

## 2012-10-17 DIAGNOSIS — R0602 Shortness of breath: Secondary | ICD-10-CM

## 2012-10-17 LAB — PULMONARY FUNCTION TEST

## 2012-10-17 NOTE — Patient Instructions (Signed)
Weight control is simply a matter of calorie balance which needs to be tilted in your favor by eating less and exercising more.  To get the most out of exercise, you need to be continuously aware that you are short of breath, but never out of breath, for 30 minutes daily. As you improve, it will actually be easier for you to do the same amount of exercise  in  30 minutes so always push to the level where you are short of breath.  If this does not result in gradual weight reduction then I strongly recommend you see a nutritionist with a food diary x 2 weeks so that we can work out a negative calorie balance which is universally effective in steady weight loss programs.  Think of your calorie balance like you do your bank account where in this case you want the balance to go down so you must take in less calories than you burn up.  It's just that simple:  Hard to do, but easy to understand.  Good luck!   Try off dulera and see if you are worse.  If not gaining ground the next step is call back for CPST call Almy[facility] Free [phone]

## 2012-10-17 NOTE — Progress Notes (Signed)
Subjective:    Patient ID: Paula Booker, female    DOB: Jun 10, 1945   MRN: [id number]   Brief patient profile:  67 yowf never smoked able to run 6 min mile at age 67 at  Wt=132 new onset sob May 2013 > cardiac w/u by Althea Charon with ICD placed in 01/06/2015p "sudden death" x 3 > referred by Dr Jillyn Hidden 05/10/2012 for pulmonary eval for unexplained resting sob.   05/10/2012 1st pulmonary eval dx sob variably present 2009 (2008 wt= 180)  rx inhaler= ventolin helped some nebulizer helped more but this was sporadic,  Could go without either for weeks at a time so in jan 2013 placed advair and less need for albuterol,  Esp reduced the need for alb  right after lying down. rec Prilosec 40mg  Take 30-60 min before first meal of the day and Pepcid 20 mg at bedtime Stop Advair Start dulera 100 Take 2 puffs first thing in am and then another 2 puffs about 12 hours later  Only use your albuterol(ventolin) as a rescue medication GERD  06/05/2012 f/u ov/Paula Booker cc sob much better with only using ventolin a few times a week.  Still sitting up due to sensation of smothering immediately so sleeps slouching over forward.   rec Rechallenge dulera 100 Take 2 puffs first thing in am and then another 2 puffs about 12 hours later- if you're convinced you're better, fill the rx Continue on acid suppression and diet   10/17/2012 f/u ov/Paula Booker re sob - unable to lie flat x 2-3 years assoc with wt gain Chief Complaint  Patient presents with  . Followup with PFT    Pt states her breathing is unchanged. No new co's today.    doe x carrying in groceries in from car.  Walks trails near house and it's only  the hills that bother her.  Not convinced needs dulera, no need for saba daytime   No obvious pattern to  daytime variabilty or assoc cough or cp or chest tightness, subjective wheeze overt sinus or hb symptoms. No unusual exp hx or h/o childhood pna/ asthma or premature birth to her knowledge.     Also denies any obvious  fluctuation of symptoms with weather or environmental changes or other aggravating or alleviating factors except as outlined above   .ROS  The following are not active complaints unless bolded sore throat, dysphagia, dental problems, itching, sneezing,  nasal congestion or excess/ purulent secretions, ear ache,   fever, chills, sweats, unintended wt loss, pleuritic or exertional cp, hemoptysis,  Orthopnea immediate only on back, does ok on side pnd or leg swelling, presyncope, palpitations, heartburn, abdominal pain, anorexia, nausea, vomiting, diarrhea  or change in bowel or urinary habits, change in stools or urine, dysuria,hematuria,  rash, arthralgias, visual complaints, headache, numbness weakness or ataxia or problems with walking or coordination,  change in mood/affect or memory.               Objective:   Physical Exam   Pleasant wf  nad    Wt  208 06/05/2012  Vs 204 10/17/2012  Wt Readings from Last 3 Encounters:  05/10/12 208 lb (94.348 kg)  04/26/12 199 lb 15.3 oz (90.7 kg)  03/23/12 203 lb 11.3 oz (92.4 kg)   HEENT: nl dentition, turbinates, and orophanx. Nl external ear canals without cough reflex   NECK :  without JVD/Nodes/TM/ nl carotid upstrokes bilaterally   LUNGS: no acc muscle use, clear to A and P bilaterally  without cough on insp or exp maneuvers   CV:  RRR  no s3 or murmur or increase in P2, no edema   ABD:  soft and nontender with nl excursion in the supine position. No bruits or organomegaly, bowel sounds nl  MS:  warm without deformities, calf tenderness, cyanosis or clubbing      V/q 04/21/12 Very low prob  cxr 04/25/12 Comparison: 01/13 and 04/20/2012  Findings: AICD in place. Heart size and vascularity are normal.  Persistent chronic elevation of the right hemidiaphragm and chronic  atelectasis at the right lung base. The atelectasis at the right  base has slightly increased posteriorly. No effusions. No acute  osseous abnormality.   IMPRESSION:  Slight increased atelectasis at the right base posteriorly.      Assessment & Plan:

## 2012-10-17 NOTE — Assessment & Plan Note (Addendum)
-   05/10/2012  Walked RA  2 laps @ 185 ft each stopped due to fatigue > sob, no desat  - Spirometry c/w restriction only 05/10/2012  - Dulera 100 trial 05/10/2012 >>> - hfa 75% 06/05/2012 ? Better or not p dulera > rechallenge - PFT's 10/17/2012 PFTs 10/17/2012  FEV1  1.68 (75%) ratio 73 and no change p B2, DLCO 93  No evidence of asthma while on dulera and not really clear she has true asthma.  While on gerd rx should try off dulera now    This is the reverse of a therapeutic trial and in a way more difficult for pts to follow than adding meds to list.  "stopping meds to see if it hurts"' rather than "starting meds to see if helps" but the nice feature of dulera is that it will start working again within 5 min if she starts loosing ground  The main problem she has is obesity deconditioning > reviewed rx  See instructions for specific recommendations which were reviewed directly with the patient who was given a copy with highlighter outlining the key components.

## 2012-10-17 NOTE — Progress Notes (Signed)
PFT done today. 

## 2012-10-26 ENCOUNTER — Encounter: Payer: Self-pay | Admitting: Internal Medicine

## 2012-12-09 ENCOUNTER — Emergency Department (INDEPENDENT_AMBULATORY_CARE_PROVIDER_SITE_OTHER)
Admission: EM | Admit: 2012-12-09 | Discharge: 2012-12-09 | Disposition: A | Discharge status: Home / Self Care | Payer: Medicare Other | Source: Home / Self Care | Attending: Emergency Medicine | Admitting: Emergency Medicine

## 2012-12-09 ENCOUNTER — Emergency Department (INDEPENDENT_AMBULATORY_CARE_PROVIDER_SITE_OTHER): Payer: Medicare Other

## 2012-12-09 ENCOUNTER — Encounter (HOSPITAL_COMMUNITY): Payer: Self-pay | Admitting: *Deleted

## 2012-12-09 DIAGNOSIS — S5010XA Contusion of unspecified forearm, initial encounter: Secondary | ICD-10-CM

## 2012-12-09 DIAGNOSIS — S5012XA Contusion of left forearm, initial encounter: Secondary | ICD-10-CM

## 2012-12-09 MED ORDER — IBUPROFEN 600 MG PO TABS: 600.0000 mg | ORAL_TABLET | Freq: Three times a day (TID) | ORAL | Status: DC

## 2012-12-09 NOTE — ED Notes (Signed)
Reports 200 lb headboard was leaning against wall when it fell down onto LUE, pinning arm; occurred 2 wks ago, but continues to have significant tenderness from left mid-forearm down to proximal lateral hand; c/o severe pain "if I happen to move it a certain way".  Has been taking Aleve and ASA.  LUE CMS intact.

## 2012-12-09 NOTE — ED Provider Notes (Signed)
CSN: [id number]     Arrival date & time 12/09/12  1544 History   First MD Initiated Contact with Patient 12/09/12 1611     Chief Complaint  Patient presents with  . Arm Injury   (Consider location/radiation/quality/duration/timing/severity/associated sxs/prior Treatment) Patient is a 67 y.o. female presenting with arm injury. The history is provided by the patient. No language interpreter was used.  Arm Injury Location:  Arm Time since incident:  2 weeks Injury: yes   Mechanism of injury comment:  OBJECT FELL ON LEFT FOREARM Arm location:  L forearm Pain details:    Radiates to:  Does not radiate   Severity:  Mild   Onset quality:  Sudden   Duration:  2 weeks   Timing:  Constant   Progression:  Unchanged Chronicity:  New Handedness:  Right-handed Dislocation: no   Foreign body present:  No foreign bodies Tetanus status:  Unknown Prior injury to area:  No Relieved by:  None tried Worsened by:  Nothing tried Ineffective treatments:  None tried Associated symptoms: swelling   Associated symptoms: no neck pain, no numbness and no stiffness   Risk factors: no known bone disorder     Past Medical History  Diagnosis Date  . Asthma   . GERD (gastroesophageal reflux disease)   . CHF (congestive heart failure)   . Hypertension   . ICD (implantable cardiac defibrillator) in place Dec 2013  . Skin cancer 1999  . Shortness of breath     "@ any time I can get SOB" (03/22/2012)  . Migraines   . Arthritis     "hands and knees" (03/22/2012)   Past Surgical History  Procedure Laterality Date  . Cardiac defibrillator placement  03/22/2012    DDD  . Cholecystectomy  ~ 2003  . Tonsillectomy and adenoidectomy  1950  . Skin cancer excision  1999    "top of my head and my right back shoulder"  . Mohs surgery  06/2011    "forehead" (03/22/2012)  . Supraventricular tachycardia ablation  03/22/2012    unable to induce VT/RN report 03/22/2012   Family History  Problem Relation Age  of Onset  . Tuberculosis Paternal Grandfather   . Congestive Heart Failure Brother   . Congestive Heart Failure Mother    History  Substance Use Topics  . Smoking status: Never Smoker   . Smokeless tobacco: Never Used  . Alcohol Use: No   OB History   Grav Para Term Preterm Abortions TAB SAB Ect Mult Living                 Review of Systems  Constitutional: Negative.   HENT: Negative.  Negative for neck pain.   Eyes: Negative.   Respiratory: Negative.   Cardiovascular: Negative.   Gastrointestinal: Negative.   Endocrine: Negative.   Genitourinary: Negative.   Musculoskeletal: Negative.  Negative for stiffness.       C/O LEFT ARM PAIN  Neurological: Negative.   Hematological: Negative.   Psychiatric/Behavioral: Negative.   All other systems reviewed and are negative.    Allergies  Penicillins; Sulfonamide derivatives; Ivp dye; and Onion  Home Medications   Current Outpatient Rx  Name  Route  Sig  Dispense  Refill  . albuterol (PROVENTIL HFA;VENTOLIN HFA) 108 (90 BASE) MCG/ACT inhaler   Inhalation   Inhale 2 puffs into the lungs every 6 (six) hours as needed. For wheezing         . ALPRAZolam (XANAX) 0.25 MG tablet   Oral  Take 1 tablet (0.25 mg total) by mouth 3 (three) times daily as needed for anxiety.   15 tablet   0   . carvedilol (COREG) 12.5 MG tablet   Oral   Take 12.5 mg by mouth 2 (two) times daily with a meal.          . cetirizine (ZYRTEC) 10 MG tablet   Oral   Take 10 mg by mouth daily.         . Cholecalciferol (VITAMIN D3) 1000 UNITS CAPS   Oral   Take 1,000 Units by mouth daily.          . citalopram (CELEXA) 20 MG tablet   Oral   Take 20 mg by mouth daily.         . famotidine (PEPCID) 20 MG tablet      One at bedtime   30 tablet   11   . furosemide (LASIX) 40 MG tablet   Oral   Take 40 mg by mouth daily.         Marland Kitchen GELATIN PO   Oral   Take 13 mg by mouth daily.         Marland Kitchen losartan (COZAAR) 50 MG tablet    Oral   Take 50 mg by mouth daily.         . Misc Natural Products (OSTEO BI-FLEX ADV DOUBLE ST PO)   Oral   Take 1 tablet by mouth every morning.          . Multiple Vitamins-Minerals (MULTIVITAMIN PO)   Oral   Take 1 tablet by mouth daily. ALIVE         . naproxen sodium (ALEVE) 220 MG tablet   Oral   Take 440 mg by mouth daily.         Marland Kitchen omeprazole (PRILOSEC) 20 MG capsule   Oral   Take 40 mg by mouth daily.         . potassium chloride (K-DUR,KLOR-CON) 10 MEQ tablet   Oral   Take 10 mEq by mouth 2 (two) times daily.         Marland Kitchen guaiFENesin-dextromethorphan (ROBITUSSIN DM) 100-10 MG/5ML syrup   Oral   Take 5 mLs by mouth every 4 (four) hours as needed for cough.   118 mL   0   . ibuprofen (ADVIL,MOTRIN) 600 MG tablet   Oral   Take 1 tablet (600 mg total) by mouth 3 (three) times daily after meals.   30 tablet   0   . mometasone-formoterol (DULERA) 100-5 MCG/ACT AERO      Take 2 puffs first thing in am and then another 2 puffs about 12 hours later.   1 Inhaler   11   . oxyCODONE-acetaminophen (PERCOCET/ROXICET) 5-325 MG per tablet   Oral   Take 1-2 tablets by mouth every 4 (four) hours as needed.   18 tablet   0    BP 133/72  Pulse 71  Temp(Src) 98.2 F (36.8 C) (Oral)  Resp 17  SpO2 97% Physical Exam  Nursing note and vitals reviewed. Constitutional: She is oriented to person, place, and time. She appears well-developed and well-nourished.  HENT:  Head: Normocephalic and atraumatic.  Mouth/Throat: Oropharynx is clear and moist.  Eyes: Conjunctivae are normal. Pupils are equal, round, and reactive to light.  Neck: Normal range of motion. Neck supple.  Cardiovascular: Normal rate, regular rhythm, normal heart sounds and intact distal pulses.   No murmur heard. Pulmonary/Chest: Effort normal and  breath sounds normal.  Abdominal: Soft. Bowel sounds are normal. She exhibits no distension and no mass. There is no tenderness.  Musculoskeletal:   LEFT FOREARM SHOWED NO SWELLING OR DEFORMITY; SL. TENDER DISTAL DORSAL ASPECT ; ELBOW NORMAL WITH FULL ROM  Neurological: She is alert and oriented to person, place, and time. No cranial nerve deficit. She exhibits normal muscle tone. Coordination normal.  Skin: Skin is warm and dry.  Psychiatric: She has a normal mood and affect.    ED Course  Procedures (including critical care time) Labs Review Labs Reviewed - No data to display Imaging Review Dg Forearm Left  12/09/2012   *RADIOLOGY REPORT*  Clinical Data: Crush injury to left forearm.  LEFT FOREARM - 2 VIEW  Comparison: None.  Findings: No acute fracture or dislocation is identified.  Soft tissues show no evidence of foreign body or soft tissue gas.  No bony lesions or destruction identified.  IMPRESSION: Normal wave form.   Original Report Authenticated By: Irish Lack, M.D.    MDM   1. Contusion of left forearm, initial encounter        Vara Guardian Marcello Moores, MD 12/09/12 1758

## 2013-01-01 ENCOUNTER — Ambulatory Visit
Admission: RE | Admit: 2013-01-01 | Discharge: 2013-01-01 | Disposition: A | Discharge status: Home / Self Care | Payer: Medicare Other | Source: Ambulatory Visit | Attending: Family Medicine | Admitting: Family Medicine

## 2013-01-01 ENCOUNTER — Other Ambulatory Visit: Payer: Self-pay | Admitting: Family Medicine

## 2013-01-01 DIAGNOSIS — T1490XA Injury, unspecified, initial encounter: Secondary | ICD-10-CM

## 2013-02-11 ENCOUNTER — Telehealth: Payer: Self-pay | Admitting: *Deleted

## 2013-02-11 DIAGNOSIS — I5022 Chronic systolic (congestive) heart failure: Secondary | ICD-10-CM

## 2013-02-12 MED ORDER — LOSARTAN POTASSIUM 50 MG PO TABS: 50.0000 mg | ORAL_TABLET | Freq: Every day | ORAL | Status: DC

## 2013-02-12 NOTE — Telephone Encounter (Signed)
Refilled

## 2013-02-13 ENCOUNTER — Telehealth: Payer: Self-pay

## 2013-02-13 NOTE — Telephone Encounter (Signed)
patient called wanting rx for lorstan and she said that the pharm said that they did not have an rx for her. So I called the pharm and they told me that the med was ready and waiting on patient , so i told the patient that it was ready

## 2013-02-21 ENCOUNTER — Encounter: Payer: Self-pay | Admitting: Interventional Cardiology

## 2013-02-21 ENCOUNTER — Ambulatory Visit (INDEPENDENT_AMBULATORY_CARE_PROVIDER_SITE_OTHER): Payer: Medicare Other | Admitting: Interventional Cardiology

## 2013-02-21 VITALS — BP 130/60 | HR 63 | Ht 64.0 in | Wt 197.0 lb

## 2013-02-21 DIAGNOSIS — I456 Pre-excitation syndrome: Secondary | ICD-10-CM

## 2013-02-21 DIAGNOSIS — I428 Other cardiomyopathies: Secondary | ICD-10-CM

## 2013-02-21 NOTE — Patient Instructions (Signed)
Your physician wants you to follow-up in: 6 months with Dr Varanasi You will receive a reminder letter in the mail two months in advance. If you don't receive a letter, please call our office to schedule the follow-up appointment.  

## 2013-02-21 NOTE — Progress Notes (Signed)
Patient ID: Paula Booker, female   DOB: 1945/06/09, 67 y.o.   MRN: [id number]    [address] [place], [place]  [address] Phone: [phone] Fax:  [phone]  Date:  02/21/2013   ID:  Paula Booker, DOB [birth year], MRN [id number]  PCP:  Cain Saupe, MD      History of Present Illness: Paula Booker is a 67 y.o. female who has a nonischemcic cardiomyopathy. She has had WPW diagnosed on ECG as well by Dr. Ladona Ridgel. Cardiomyopathy:  c/o Chest pain rare.  c/o Palpitations rare.  Denies : Shortness of breath.  Dizziness.  Leg edema.  Orthopnea.  Paroxysmal nocturnal dyspnea.  Syncope.   She as lost weight intentionally.  SHe is using the treadmill and watching her diet. She has lost 21 lbs.     Wt Readings from Last 3 Encounters:  02/21/13 197 lb (89.359 kg)  10/17/12 204 lb (92.534 kg)  07/03/12 205 lb 12.8 oz (93.35 kg)     Past Medical History  Diagnosis Date  . Asthma   . GERD (gastroesophageal reflux disease)   . CHF (congestive heart failure)   . Hypertension   . ICD (implantable cardiac defibrillator) in place Dec 2013  . Skin cancer 1999  . Shortness of breath     "@ any time I can get SOB" (03/22/2012)  . Migraines   . Arthritis     "hands and knees" (03/22/2012)    Current Outpatient Prescriptions  Medication Sig Dispense Refill  . albuterol (PROVENTIL HFA;VENTOLIN HFA) 108 (90 BASE) MCG/ACT inhaler Inhale 2 puffs into the lungs every 6 (six) hours as needed. For wheezing      . ALPRAZolam (XANAX) 0.25 MG tablet Take 1 tablet (0.25 mg total) by mouth 3 (three) times daily as needed for anxiety.  15 tablet  0  . carvedilol (COREG) 12.5 MG tablet Take 12.5 mg by mouth 2 (two) times daily with a meal.       . cetirizine (ZYRTEC) 10 MG tablet Take 10 mg by mouth daily.      . Cholecalciferol (VITAMIN D3) 1000 UNITS CAPS Take 1,000 Units by mouth daily.       . citalopram (CELEXA) 20 MG tablet Take 20 mg by mouth daily.      . famotidine  (PEPCID) 20 MG tablet One at bedtime  30 tablet  11  . furosemide (LASIX) 40 MG tablet Take 40 mg by mouth daily.      Marland Kitchen GELATIN PO Take 13 mg by mouth daily.      Marland Kitchen guaiFENesin-dextromethorphan (ROBITUSSIN DM) 100-10 MG/5ML syrup Take 5 mLs by mouth every 4 (four) hours as needed for cough.  118 mL  0  . losartan (COZAAR) 50 MG tablet Take 1 tablet (50 mg total) by mouth daily.  30 tablet  4  . LYRICA 50 MG capsule 1 tab daily      . Misc Natural Products (OSTEO BI-FLEX ADV DOUBLE ST PO) Take 1 tablet by mouth every morning.       . Multiple Vitamins-Minerals (MULTIVITAMIN PO) Take 1 tablet by mouth daily. ALIVE      . naproxen sodium (ALEVE) 220 MG tablet Take 440 mg by mouth daily.      Marland Kitchen omeprazole (PRILOSEC) 20 MG capsule Take 40 mg by mouth daily.      Marland Kitchen oxyCODONE-acetaminophen (PERCOCET/ROXICET) 5-325 MG per tablet Take 1-2 tablets by mouth every 4 (four) hours as needed.  18 tablet  0  .  potassium chloride (K-DUR,KLOR-CON) 10 MEQ tablet Take 10 mEq by mouth 2 (two) times daily.       No current facility-administered medications for this visit.    Allergies:    Allergies  Allergen Reactions  . Penicillins Anaphylaxis  . Sulfonamide Derivatives Other (See Comments)    Migraines  . Ivp Dye [Iodinated Diagnostic Agents] Hives  . Onion Other (See Comments)    Raw onions cause migraines    Social History:  The patient  reports that she has never smoked. She has never used smokeless tobacco. She reports that she does not drink alcohol or use illicit drugs.   Family History:  The patient's family history includes Congestive Heart Failure in her brother and mother; Tuberculosis in her paternal grandfather.   ROS:  Please see the history of present illness.  No nausea, vomiting.  No fevers, chills.  No focal weakness.  No dysuria.    All other systems reviewed and negative.   PHYSICAL EXAM: VS:  BP 130/60  Pulse 63  Ht 5\' 4"  (1.626 m)  Wt 197 lb (89.359 kg)  BMI 33.80 kg/m2 Well  nourished, well developed, in no acute distress HEENT: normal Neck: no JVD, no carotid bruits Cardiac:  normal S1, S2; RRR;  Lungs:  clear to auscultation bilaterally, no wheezing, rhonchi or rales Abd: soft, nontender, no hepatomegaly Ext: no edema Skin: warm and dry Neuro:   no focal abnormalities noted Defib site mildly sore.    ASSESSMENT AND PLAN:  Cardiomyopathy  Notes: Nonischemic cardiomyopathy with defibrillator placed. Her defibrillator has not fired. She clinically does not appear to be in congestive heart failure.  2. Abnormal EKG  Notes: evidence of Wolff-Parkinson-White on her prior ECG. no evidence of tachycardia on exam. Now has AICD in place to check if she has had arrhythmia.    Signed, Fredric Mare, MD, [facility] 02/21/2013 11:49 AM

## 2013-03-22 ENCOUNTER — Other Ambulatory Visit: Payer: Self-pay | Admitting: Family Medicine

## 2013-03-22 DIAGNOSIS — Z1231 Encounter for screening mammogram for malignant neoplasm of breast: Secondary | ICD-10-CM

## 2013-03-22 DIAGNOSIS — Z780 Asymptomatic menopausal state: Secondary | ICD-10-CM

## 2013-03-26 ENCOUNTER — Encounter: Payer: Self-pay | Admitting: Internal Medicine

## 2013-03-26 ENCOUNTER — Ambulatory Visit (INDEPENDENT_AMBULATORY_CARE_PROVIDER_SITE_OTHER): Payer: Medicare Other | Admitting: Internal Medicine

## 2013-03-26 ENCOUNTER — Encounter (INDEPENDENT_AMBULATORY_CARE_PROVIDER_SITE_OTHER): Payer: Self-pay

## 2013-03-26 VITALS — BP 112/70 | HR 64 | Ht 64.0 in | Wt 195.8 lb

## 2013-03-26 DIAGNOSIS — R55 Syncope and collapse: Secondary | ICD-10-CM

## 2013-03-26 DIAGNOSIS — I509 Heart failure, unspecified: Secondary | ICD-10-CM

## 2013-03-26 DIAGNOSIS — Z95810 Presence of automatic (implantable) cardiac defibrillator: Secondary | ICD-10-CM | POA: Insufficient documentation

## 2013-03-26 DIAGNOSIS — I428 Other cardiomyopathies: Secondary | ICD-10-CM

## 2013-03-26 DIAGNOSIS — I5023 Acute on chronic systolic (congestive) heart failure: Secondary | ICD-10-CM

## 2013-03-26 LAB — MDC_IDC_ENUM_SESS_TYPE_INCLINIC
Battery Remaining Longevity: 125 mo
Battery Voltage: 3 V
Brady Statistic AP VP Percent: 0.02 %
Brady Statistic AS VP Percent: 0.03 %
Brady Statistic AS VS Percent: 87.26 %
Date Time Interrogation Session: 20141216110234
HighPow Impedance: 247 Ohm
HighPow Impedance: 72 Ohm
Lead Channel Impedance Value: 513 Ohm
Lead Channel Pacing Threshold Amplitude: 0.5 V
Lead Channel Pacing Threshold Amplitude: 0.75 V
Lead Channel Pacing Threshold Pulse Width: 0.4 ms
Lead Channel Sensing Intrinsic Amplitude: 13 mV
Lead Channel Sensing Intrinsic Amplitude: 4.5 mV
Lead Channel Sensing Intrinsic Amplitude: 5 mV
Lead Channel Setting Pacing Amplitude: 2.5 V
Lead Channel Setting Pacing Pulse Width: 0.4 ms
Lead Channel Setting Sensing Sensitivity: 0.3 mV
Zone Setting Detection Interval: 350 ms

## 2013-03-26 NOTE — Assessment & Plan Note (Signed)
Her chronic systolic CHF is class 2B. I have encouraged her to increase her physical activity.

## 2013-03-26 NOTE — Progress Notes (Signed)
HPI Paula Booker returns today for followup. She is a pleasant 67 yo woman with a non-ischemic CM, syncope and a WPW pattern on her ECG who underwent EP study with no inducible arrhythmias followed by ICD implant a year ago. She has been trying to increase her physical activity and is walking for 10 minutes at a time, 3 times a day. She denies any ICD shocks. She has lost almost 20 lbs. No other complaints. No peripheral edema. Allergies  Allergen Reactions  . Ivp Dye [Iodinated Diagnostic Agents] Anaphylaxis, Hives, Swelling and Other (See Comments)    Throat swell  . Penicillins Anaphylaxis  . Sulfonamide Derivatives Other (See Comments)    Migraines  . Onion Other (See Comments)    Raw onions cause migraines     Current Outpatient Prescriptions  Medication Sig Dispense Refill  . albuterol (PROVENTIL HFA;VENTOLIN HFA) 108 (90 BASE) MCG/ACT inhaler Inhale 2 puffs into the lungs every 6 (six) hours as needed. For wheezing      . ALPRAZolam (XANAX) 0.25 MG tablet Take 1 tablet (0.25 mg total) by mouth 3 (three) times daily as needed for anxiety.  15 tablet  0  . carvedilol (COREG) 12.5 MG tablet Take 12.5 mg by mouth 2 (two) times daily with a meal.       . cetirizine (ZYRTEC) 10 MG tablet Take 10 mg by mouth daily.      . Cholecalciferol (VITAMIN D3) 1000 UNITS CAPS Take 1,000 Units by mouth daily.       . citalopram (CELEXA) 20 MG tablet Take 20 mg by mouth daily.      . famotidine (PEPCID) 20 MG tablet One at bedtime  30 tablet  11  . furosemide (LASIX) 40 MG tablet Take 40 mg by mouth daily.      Marland Kitchen GELATIN PO Take 13 mg by mouth daily.      Marland Kitchen guaiFENesin-dextromethorphan (ROBITUSSIN DM) 100-10 MG/5ML syrup Take 5 mLs by mouth every 4 (four) hours as needed for cough.  118 mL  0  . losartan (COZAAR) 50 MG tablet Take 1 tablet (50 mg total) by mouth daily.  30 tablet  4  . LYRICA 50 MG capsule Take 1 tab daily. Pt is weaning off of this medication.      . Misc Natural Products  (OSTEO BI-FLEX ADV DOUBLE ST PO) Take 1 tablet by mouth every morning.       . Multiple Vitamins-Minerals (MULTIVITAMIN PO) Take 1 tablet by mouth daily. ALIVE      . naproxen sodium (ALEVE) 220 MG tablet Take 440 mg by mouth daily.      Marland Kitchen omeprazole (PRILOSEC) 20 MG capsule Take 40 mg by mouth daily.      Marland Kitchen oxyCODONE-acetaminophen (PERCOCET/ROXICET) 5-325 MG per tablet Take 1-2 tablets by mouth every 4 (four) hours as needed.  18 tablet  0  . potassium chloride (K-DUR,KLOR-CON) 10 MEQ tablet Take 10 mEq by mouth 2 (two) times daily.       No current facility-administered medications for this visit.     Past Medical History  Diagnosis Date  . Asthma   . GERD (gastroesophageal reflux disease)   . CHF (congestive heart failure)   . Hypertension   . ICD (implantable cardiac defibrillator) in place Dec 2013  . Skin cancer 1999  . Shortness of breath     "@ any time I can get SOB" (03/22/2012)  . Migraines   . Arthritis     "  hands and knees" (03/22/2012)    ROS:   All systems reviewed and negative except as noted in the HPI.   Past Surgical History  Procedure Laterality Date  . Cardiac defibrillator placement  03/22/2012    DDD  . Cholecystectomy  ~ 2003  . Tonsillectomy and adenoidectomy  1950  . Skin cancer excision  1999    "top of my head and my right back shoulder"  . Mohs surgery  06/2011    "forehead" (03/22/2012)  . Supraventricular tachycardia ablation  03/22/2012    unable to induce VT/RN report 03/22/2012     Family History  Problem Relation Age of Onset  . Tuberculosis Paternal Grandfather   . Congestive Heart Failure Brother   . Congestive Heart Failure Mother      History   Social History  . Marital Status: Single    Spouse Name: N/A    Number of Children: 3  . Years of Education: N/A   Occupational History  . retired     Airline pilot   Social History Main Topics  . Smoking status: Never Smoker   . Smokeless tobacco: Never Used  . Alcohol  Use: No  . Drug Use: No  . Sexual Activity: Not on file   Other Topics Concern  . Not on file   Social History Narrative  . No narrative on file     BP 112/70  Pulse 64  Ht 5\' 4"  (1.626 m)  Wt 195 lb 12.8 oz (88.814 kg)  BMI 33.59 kg/m2  Physical Exam:  Well appearing 67 yo woman, NAD HEENT: Unremarkable Neck:  No JVD, no thyromegally Back:  No CVA tenderness Lungs:  Clear with no wheezes HEART:  Regular rate rhythm, no murmurs, no rubs, no clicks Abd:  soft, positive bowel sounds, no organomegally, no rebound, no guarding Ext:  2 plus pulses, no edema, no cyanosis, no clubbing Skin:  No rashes no nodules Neuro:  CN II through XII intact, motor grossly intact  EKG - NSR with a short PR  DEVICE  Normal device function.  See PaceArt for details.   Assess/Plan:

## 2013-03-26 NOTE — Patient Instructions (Signed)
Your physician wants you to follow-up in: 12 months with Dr Court Joy will receive a reminder letter in the mail two months in advance. If you don't receive a letter, please call our office to schedule the follow-up appointment.  Remote monitoring is used to monitor your Pacemaker or ICD from home. This monitoring reduces the number of office visits required to check your device to one time per year. It allows [place] to keep an eye on the functioning of your device to ensure it is working properly. You are scheduled for a device check from home on 06/27/13. You may send your transmission at any time that day. If you have a wireless device, the transmission will be sent automatically. After your physician reviews your transmission, you will receive a postcard with your next transmission date.

## 2013-03-26 NOTE — Assessment & Plan Note (Signed)
Her medtronic DDD ICD is working normally. Will recheck in several months. 

## 2013-03-26 NOTE — Assessment & Plan Note (Signed)
She has no recurrent episodes. Will follow.

## 2013-04-30 ENCOUNTER — Ambulatory Visit
Admission: RE | Admit: 2013-04-30 | Discharge: 2013-04-30 | Disposition: A | Discharge status: Home / Self Care | Payer: Medicare Other | Source: Ambulatory Visit | Attending: Family Medicine | Admitting: Family Medicine

## 2013-04-30 DIAGNOSIS — Z780 Asymptomatic menopausal state: Secondary | ICD-10-CM

## 2013-04-30 DIAGNOSIS — Z1231 Encounter for screening mammogram for malignant neoplasm of breast: Secondary | ICD-10-CM

## 2013-05-02 ENCOUNTER — Other Ambulatory Visit: Payer: Self-pay | Admitting: *Deleted

## 2013-05-02 MED ORDER — CARVEDILOL 12.5 MG PO TABS: 12.5000 mg | ORAL_TABLET | Freq: Two times a day (BID) | ORAL | Status: DC

## 2013-05-21 ENCOUNTER — Other Ambulatory Visit: Payer: Self-pay

## 2013-05-21 MED ORDER — FUROSEMIDE 40 MG PO TABS: 40.0000 mg | ORAL_TABLET | Freq: Every day | ORAL | Status: DC

## 2013-06-14 ENCOUNTER — Other Ambulatory Visit: Payer: Self-pay | Admitting: Physician Assistant

## 2013-06-27 ENCOUNTER — Encounter: Payer: Medicare Other | Admitting: *Deleted

## 2013-07-01 ENCOUNTER — Encounter: Payer: Self-pay | Admitting: *Deleted

## 2013-07-09 ENCOUNTER — Encounter: Payer: Self-pay | Admitting: Internal Medicine

## 2013-07-09 ENCOUNTER — Ambulatory Visit (INDEPENDENT_AMBULATORY_CARE_PROVIDER_SITE_OTHER): Payer: Medicare Other | Admitting: *Deleted

## 2013-07-09 DIAGNOSIS — I428 Other cardiomyopathies: Secondary | ICD-10-CM

## 2013-07-09 DIAGNOSIS — Z95810 Presence of automatic (implantable) cardiac defibrillator: Secondary | ICD-10-CM

## 2013-07-09 LAB — MDC_IDC_ENUM_SESS_TYPE_REMOTE
Battery Remaining Longevity: 123 mo
Battery Voltage: 3.01 V
Brady Statistic AS VS Percent: 93.57 %
Brady Statistic RA Percent Paced: 6.39 %
HighPow Impedance: 285 Ohm
HighPow Impedance: 77 Ohm
Lead Channel Impedance Value: 513 Ohm
Lead Channel Impedance Value: 760 Ohm
Lead Channel Pacing Threshold Amplitude: 1 V
Lead Channel Pacing Threshold Pulse Width: 0.4 ms
Lead Channel Sensing Intrinsic Amplitude: 3.75 mV
Lead Channel Sensing Intrinsic Amplitude: 9.25 mV
Lead Channel Setting Pacing Pulse Width: 0.4 ms
Lead Channel Setting Sensing Sensitivity: 0.3 mV
MDC IDC MSMT LEADCHNL RV PACING THRESHOLD AMPLITUDE: 0.5 V
MDC IDC MSMT LEADCHNL RV PACING THRESHOLD PULSEWIDTH: 0.4 ms
MDC IDC SESS DTM: 20150331125343
MDC IDC SET LEADCHNL RA PACING AMPLITUDE: 2 V
MDC IDC SET LEADCHNL RV PACING AMPLITUDE: 2.5 V
MDC IDC SET ZONE DETECTION INTERVAL: 400 ms
MDC IDC STAT BRADY AP VP PERCENT: 0.01 %
MDC IDC STAT BRADY AP VS PERCENT: 6.38 %
MDC IDC STAT BRADY AS VP PERCENT: 0.03 %
MDC IDC STAT BRADY RV PERCENT PACED: 0.05 %
Zone Setting Detection Interval: 270 ms
Zone Setting Detection Interval: 300 ms
Zone Setting Detection Interval: 350 ms

## 2013-07-17 ENCOUNTER — Other Ambulatory Visit: Payer: Self-pay

## 2013-07-17 DIAGNOSIS — I5022 Chronic systolic (congestive) heart failure: Secondary | ICD-10-CM

## 2013-07-17 MED ORDER — LOSARTAN POTASSIUM 50 MG PO TABS: 50.0000 mg | ORAL_TABLET | Freq: Every day | ORAL | Status: DC

## 2013-07-26 ENCOUNTER — Emergency Department (HOSPITAL_COMMUNITY)
Admission: EM | Admit: 2013-07-26 | Discharge: 2013-07-26 | Disposition: A | Discharge status: Home / Self Care | Payer: Medicare Other | Attending: Emergency Medicine | Admitting: Emergency Medicine

## 2013-07-26 ENCOUNTER — Emergency Department (HOSPITAL_COMMUNITY): Payer: Medicare Other

## 2013-07-26 ENCOUNTER — Encounter (HOSPITAL_COMMUNITY): Payer: Self-pay | Admitting: Emergency Medicine

## 2013-07-26 DIAGNOSIS — M1710 Unilateral primary osteoarthritis, unspecified knee: Secondary | ICD-10-CM | POA: Insufficient documentation

## 2013-07-26 DIAGNOSIS — Z79899 Other long term (current) drug therapy: Secondary | ICD-10-CM | POA: Insufficient documentation

## 2013-07-26 DIAGNOSIS — IMO0002 Reserved for concepts with insufficient information to code with codable children: Secondary | ICD-10-CM

## 2013-07-26 DIAGNOSIS — Z85828 Personal history of other malignant neoplasm of skin: Secondary | ICD-10-CM | POA: Insufficient documentation

## 2013-07-26 DIAGNOSIS — Z880 Allergy status to penicillin: Secondary | ICD-10-CM | POA: Insufficient documentation

## 2013-07-26 DIAGNOSIS — R0602 Shortness of breath: Secondary | ICD-10-CM

## 2013-07-26 DIAGNOSIS — I509 Heart failure, unspecified: Secondary | ICD-10-CM | POA: Insufficient documentation

## 2013-07-26 DIAGNOSIS — R5383 Other fatigue: Secondary | ICD-10-CM | POA: Insufficient documentation

## 2013-07-26 DIAGNOSIS — R5381 Other malaise: Secondary | ICD-10-CM | POA: Insufficient documentation

## 2013-07-26 DIAGNOSIS — J45901 Unspecified asthma with (acute) exacerbation: Secondary | ICD-10-CM | POA: Insufficient documentation

## 2013-07-26 DIAGNOSIS — M19049 Primary osteoarthritis, unspecified hand: Secondary | ICD-10-CM | POA: Insufficient documentation

## 2013-07-26 DIAGNOSIS — I10 Essential (primary) hypertension: Secondary | ICD-10-CM | POA: Insufficient documentation

## 2013-07-26 DIAGNOSIS — Z791 Long term (current) use of non-steroidal anti-inflammatories (NSAID): Secondary | ICD-10-CM | POA: Insufficient documentation

## 2013-07-26 DIAGNOSIS — R531 Weakness: Secondary | ICD-10-CM

## 2013-07-26 DIAGNOSIS — K219 Gastro-esophageal reflux disease without esophagitis: Secondary | ICD-10-CM | POA: Insufficient documentation

## 2013-07-26 DIAGNOSIS — Z95810 Presence of automatic (implantable) cardiac defibrillator: Secondary | ICD-10-CM | POA: Insufficient documentation

## 2013-07-26 DIAGNOSIS — R110 Nausea: Secondary | ICD-10-CM | POA: Insufficient documentation

## 2013-07-26 HISTORY — DX: Pre-excitation syndrome: I45.6

## 2013-07-26 LAB — I-STAT CHEM 8, ED
BUN: 21 mg/dL (ref 6–23)
CALCIUM ION: 1.19 mmol/L (ref 1.13–1.30)
Chloride: 104 mEq/L (ref 96–112)
Creatinine, Ser: 0.9 mg/dL (ref 0.50–1.10)
Glucose, Bld: 112 mg/dL — ABNORMAL HIGH (ref 70–99)
HCT: 37 % (ref 36.0–46.0)
HEMOGLOBIN: 12.6 g/dL (ref 12.0–15.0)
Potassium: 3.6 mEq/L — ABNORMAL LOW (ref 3.7–5.3)
Sodium: 142 mEq/L (ref 137–147)
TCO2: 26 mmol/L (ref 0–100)

## 2013-07-26 LAB — URINALYSIS, ROUTINE W REFLEX MICROSCOPIC
BILIRUBIN URINE: NEGATIVE
Glucose, UA: NEGATIVE mg/dL
KETONES UR: NEGATIVE mg/dL
NITRITE: NEGATIVE
PH: 5.5 (ref 5.0–8.0)
Protein, ur: NEGATIVE mg/dL
Specific Gravity, Urine: 1.019 (ref 1.005–1.030)
Urobilinogen, UA: 0.2 mg/dL (ref 0.0–1.0)

## 2013-07-26 LAB — I-STAT TROPONIN, ED: Troponin i, poc: 0.01 ng/mL (ref 0.00–0.08)

## 2013-07-26 LAB — PRO B NATRIURETIC PEPTIDE: PRO B NATRI PEPTIDE: 311.5 pg/mL — AB (ref 0–125)

## 2013-07-26 LAB — CBC WITH DIFFERENTIAL/PLATELET
BASOS ABS: 0 10*3/uL (ref 0.0–0.1)
BASOS PCT: 0 % (ref 0–1)
EOS ABS: 0.5 10*3/uL (ref 0.0–0.7)
EOS PCT: 6 % — AB (ref 0–5)
HEMATOCRIT: 36.5 % (ref 36.0–46.0)
Hemoglobin: 12.2 g/dL (ref 12.0–15.0)
LYMPHS PCT: 42 % (ref 12–46)
Lymphs Abs: 3.3 10*3/uL (ref 0.7–4.0)
MCH: 30.3 pg (ref 26.0–34.0)
MCHC: 33.4 g/dL (ref 30.0–36.0)
MCV: 90.8 fL (ref 78.0–100.0)
MONO ABS: 0.5 10*3/uL (ref 0.1–1.0)
Monocytes Relative: 7 % (ref 3–12)
Neutro Abs: 3.6 10*3/uL (ref 1.7–7.7)
Neutrophils Relative %: 45 % (ref 43–77)
Platelets: 231 10*3/uL (ref 150–400)
RBC: 4.02 MIL/uL (ref 3.87–5.11)
RDW: 12.9 % (ref 11.5–15.5)
WBC: 7.8 10*3/uL (ref 4.0–10.5)

## 2013-07-26 LAB — URINE MICROSCOPIC-ADD ON

## 2013-07-26 NOTE — ED Notes (Signed)
Pt ambulated without any assistance, O2 sat 97%

## 2013-07-26 NOTE — ED Notes (Signed)
Per Gertie Fey PA, ran [employer] interrogation on [employer] and sent data to [employer].

## 2013-07-26 NOTE — ED Provider Notes (Signed)
CSN: [id number]     Arrival date & time 07/26/13  1627 History   First MD Initiated Contact with Patient 07/26/13 1628     Chief Complaint  Patient presents with  . Shortness of Breath     (Consider location/radiation/quality/duration/timing/severity/associated sxs/prior Treatment) HPI  68 year old female with history of CHF IIB, WPW, has ICD, and GERD who presents complaining of shortness of breath. Patient reports she has been doing well however after walking around at [place] she developed acute onset of generalized weakness and also having trouble breathing. States she feels very tired and sleepy, could hardly catch her breath. Also feel mildly nauseous and having a "fluttering sensation in my stomach". This is new for her.  Incident happened 2-3 hrs ago.  Family call EMS to bring pt here.  No specific treatment tried.  She denies fever, chills, cp, productive cough, hemoptysis, pleuritic chest pain, vomit, diarrhea, dysuria, or rash.  No new changes to suggest increasing risk of PE. Denies prior MI.  Report having been active lately doing house work without any discomfort.  Denies heart palpitation.    Past Medical History  Diagnosis Date  . Asthma   . GERD (gastroesophageal reflux disease)   . CHF (congestive heart failure)   . Hypertension   . ICD (implantable cardiac defibrillator) in place Dec 2013  . Skin cancer 1999  . Shortness of breath     "@ any time I can get SOB" (03/22/2012)  . Migraines   . Arthritis     "hands and knees" (03/22/2012)   Past Surgical History  Procedure Laterality Date  . Cardiac defibrillator placement  03/22/2012    DDD  . Cholecystectomy  ~ 2003  . Tonsillectomy and adenoidectomy  1950  . Skin cancer excision  1999    "top of my head and my right back shoulder"  . Mohs surgery  06/2011    "forehead" (03/22/2012)  . Supraventricular tachycardia ablation  03/22/2012    unable to induce VT/RN report 03/22/2012   Family History  Problem  Relation Age of Onset  . Tuberculosis Paternal Grandfather   . Congestive Heart Failure Brother   . Congestive Heart Failure Mother    History  Substance Use Topics  . Smoking status: Never Smoker   . Smokeless tobacco: Never Used  . Alcohol Use: No   OB History   Grav Para Term Preterm Abortions TAB SAB Ect Mult Living                 Review of Systems  All other systems reviewed and are negative.     Allergies  Ivp dye; Penicillins; Sulfonamide derivatives; and Onion  Home Medications   Prior to Admission medications   Medication Sig Start Date End Date Taking? Authorizing Provider  albuterol (PROVENTIL HFA;VENTOLIN HFA) 108 (90 BASE) MCG/ACT inhaler Inhale 2 puffs into the lungs every 6 (six) hours as needed. For wheezing    Historical Provider, MD  ALPRAZolam (XANAX) 0.25 MG tablet Take 1 tablet (0.25 mg total) by mouth 3 (three) times daily as needed for anxiety. 04/26/12   Radene Gunning, NP  carvedilol (COREG) 12.5 MG tablet Take 1 tablet (12.5 mg total) by mouth 2 (two) times daily with a meal. 05/02/13   Jettie Booze, MD  cetirizine (ZYRTEC) 10 MG tablet Take 10 mg by mouth daily.    Historical Provider, MD  Cholecalciferol (VITAMIN D3) 1000 UNITS CAPS Take 1,000 Units by mouth daily.     Historical  Provider, MD  citalopram (CELEXA) 20 MG tablet Take 20 mg by mouth daily.    Historical Provider, MD  famotidine (PEPCID) 20 MG tablet One at bedtime 06/05/12   Tanda Rockers, MD  furosemide (LASIX) 40 MG tablet Take 1 tablet (40 mg total) by mouth daily. 05/21/13   Jettie Booze, MD  GELATIN PO Take 13 mg by mouth daily.    Historical Provider, MD  guaiFENesin-dextromethorphan (ROBITUSSIN DM) 100-10 MG/5ML syrup Take 5 mLs by mouth every 4 (four) hours as needed for cough. 04/26/12   Radene Gunning, NP  losartan (COZAAR) 50 MG tablet Take 1 tablet (50 mg total) by mouth daily. 07/17/13   Jettie Booze, MD  LYRICA 50 MG capsule Take 1 tab daily. Pt is weaning  off of this medication. 02/18/13   Historical Provider, MD  Misc Natural Products (OSTEO BI-FLEX ADV DOUBLE ST PO) Take 1 tablet by mouth every morning.     Historical Provider, MD  Multiple Vitamins-Minerals (MULTIVITAMIN PO) Take 1 tablet by mouth daily. ALIVE    Historical Provider, MD  naproxen sodium (ALEVE) 220 MG tablet Take 440 mg by mouth daily.    Historical Provider, MD  omeprazole (PRILOSEC) 20 MG capsule Take 40 mg by mouth daily.    Historical Provider, MD  oxyCODONE-acetaminophen (PERCOCET/ROXICET) 5-325 MG per tablet Take 1-2 tablets by mouth every 4 (four) hours as needed. 04/26/12   Radene Gunning, NP  potassium chloride (K-DUR,KLOR-CON) 10 MEQ tablet Take 10 mEq by mouth 2 (two) times daily.    Historical Provider, MD   There were no vitals taken for this visit. Physical Exam  Nursing note and vitals reviewed. Constitutional: She is oriented to person, place, and time. She appears well-developed and well-nourished. No distress.  HENT:  Head: Atraumatic.  Mouth/Throat: Oropharynx is clear and moist.  Eyes: Conjunctivae are normal.  Neck: Neck supple.  Cardiovascular: Normal rate and regular rhythm.  Exam reveals no gallop and no friction rub.   No murmur heard. Pulmonary/Chest: Effort normal. No respiratory distress. She has no wheezes.  Abdominal: Soft. There is no tenderness.  Musculoskeletal: She exhibits no edema.  Global weakness to all 4 extremities without focal weakness.    Neurological: She is alert and oriented to person, place, and time. She displays no tremor. No cranial nerve deficit or sensory deficit. She displays no seizure activity. GCS eye subscore is 4. GCS verbal subscore is 5. GCS motor subscore is 6.  Skin: No rash noted.  Psychiatric: She has a normal mood and affect.    ED Course  Procedures (including critical care time)  5:01 PM New onset weakness and SOB, due to age cannot rule out PE.  Given sxs, suspicious for cardiopulmonary etiology.   Work up initiated.  Will interrogate her Medtronic defibrillator.  Care discussed with Dr. Alvino Chapel.    5:29 PM Medtronic device was interrogated and showing no acute changes.  ECG initially were noted to have new LBBB, but upon reviewing there was no significant changes as compare to prior ECG. Trop is negative and CXR without acute changes.  Mildly elevated ProBNP of 311, not of significant finding.  Electrolytes are reassuring.    6:50 PM Pt able to ambulate without difficulty while maintaining O2 sat of 98% on RA.  If UA unremarkable, pt can be discharged.    7:58 PM UA without obvious signs of UTI.  Urine culture sent.  Pt however felt better and request discharge.  She is  stable for discharge with close f/u with her PCP.  Return precaution discussed.    Labs Review Labs Reviewed  CBC WITH DIFFERENTIAL - Abnormal; Notable for the following:    Eosinophils Relative 6 (*)    All other components within normal limits  PRO B NATRIURETIC PEPTIDE - Abnormal; Notable for the following:    Pro B Natriuretic peptide (BNP) 311.5 (*)    All other components within normal limits  URINALYSIS, ROUTINE W REFLEX MICROSCOPIC - Abnormal; Notable for the following:    Hgb urine dipstick TRACE (*)    Leukocytes, UA MODERATE (*)    All other components within normal limits  URINE MICROSCOPIC-ADD ON - Abnormal; Notable for the following:    Squamous Epithelial / LPF FEW (*)    All other components within normal limits  I-STAT CHEM 8, ED - Abnormal; Notable for the following:    Potassium 3.6 (*)    Glucose, Bld 112 (*)    All other components within normal limits  URINE CULTURE  Randolm Idol, ED    Imaging Review Dg Chest Port 1 View  07/26/2013   CLINICAL DATA:  Shortness of breath.  Hypertension.  EXAM: PORTABLE CHEST - 1 VIEW  COMPARISON:  CT CHEST W/O CM dated 04/25/2012; DG CHEST 2 VIEW dated 04/25/2012  FINDINGS: Mildly enlarged cardiopericardial silhouette. Dual lead pacer noted. No  edema. The lungs appear clear.  IMPRESSION: 1. Mildly enlarged cardiopericardial silhouette. 2. No acute findings.   Electronically Signed   By: Sherryl Barters M.D.   On: 07/26/2013 17:06     EKG Interpretation None      Date: 07/26/2013  Rate: 73  Rhythm: normal sinus rhythm  QRS Axis: normal  Intervals: normal  ST/T Wave abnormalities: nonspecific ST changes  Conduction Disutrbances:incomplete LBBB  Narrative Interpretation:   Old EKG Reviewed: unchanged    MDM   Final diagnoses:  Generalized weakness  Shortness of breath    BP 124/49  Pulse 74  Temp(Src) 97.8 F (36.6 C) (Oral)  Resp 13  SpO2 100%  I have reviewed nursing notes and vital signs. I personally reviewed the imaging tests through PACS system  I reviewed available ER/hospitalization records thought the EMR     Domenic Moras, [place] 07/26/13 1959

## 2013-07-26 NOTE — ED Notes (Signed)
Pt was shopping and began experiencing acute onset sob and weakness/dizziness.  Hx of chf, wpw and asthma.  Pt was 98% on RA.  Denied pain.  EMS ekg showed new onset LBB.

## 2013-07-26 NOTE — Discharge Instructions (Signed)

## 2013-07-27 NOTE — ED Provider Notes (Signed)
Medical screening examination/treatment/procedure(s) were performed by non-physician practitioner and as supervising physician I was immediately available for consultation/collaboration.   EKG Interpretation None       Paula Booker. Alvino Chapel, MD 07/27/13 2010

## 2013-07-28 LAB — URINE CULTURE
COLONY COUNT: NO GROWTH
Culture: NO GROWTH

## 2013-08-06 ENCOUNTER — Encounter: Payer: Self-pay | Admitting: *Deleted

## 2013-09-16 ENCOUNTER — Encounter: Payer: Self-pay | Admitting: Interventional Cardiology

## 2013-09-16 ENCOUNTER — Ambulatory Visit (INDEPENDENT_AMBULATORY_CARE_PROVIDER_SITE_OTHER): Payer: Medicare Other | Admitting: Interventional Cardiology

## 2013-09-16 VITALS — BP 151/79 | HR 74 | Ht 63.0 in | Wt 209.8 lb

## 2013-09-16 DIAGNOSIS — R0602 Shortness of breath: Secondary | ICD-10-CM

## 2013-09-16 DIAGNOSIS — I428 Other cardiomyopathies: Secondary | ICD-10-CM

## 2013-09-16 DIAGNOSIS — I5022 Chronic systolic (congestive) heart failure: Secondary | ICD-10-CM

## 2013-09-16 DIAGNOSIS — R5381 Other malaise: Secondary | ICD-10-CM

## 2013-09-16 DIAGNOSIS — R5383 Other fatigue: Secondary | ICD-10-CM

## 2013-09-16 DIAGNOSIS — I509 Heart failure, unspecified: Secondary | ICD-10-CM

## 2013-09-16 DIAGNOSIS — I456 Pre-excitation syndrome: Secondary | ICD-10-CM

## 2013-09-16 LAB — BASIC METABOLIC PANEL
BUN: 24 mg/dL — ABNORMAL HIGH (ref 6–23)
CHLORIDE: 106 meq/L (ref 96–112)
CO2: 28 mEq/L (ref 19–32)
Calcium: 9.5 mg/dL (ref 8.4–10.5)
Creatinine, Ser: 0.7 mg/dL (ref 0.4–1.2)
GFR: 96.47 mL/min (ref >60.00)
Glucose, Bld: 91 mg/dL (ref 70–99)
Potassium: 4.3 mEq/L (ref 3.5–5.1)
SODIUM: 139 meq/L (ref 135–145)

## 2013-09-16 LAB — BRAIN NATRIURETIC PEPTIDE: Pro B Natriuretic peptide (BNP): 45 pg/mL (ref 0.0–100.0)

## 2013-09-16 MED ORDER — LOSARTAN POTASSIUM 50 MG PO TABS: 50.0000 mg | ORAL_TABLET | Freq: Every day | ORAL | Status: DC

## 2013-09-16 MED ORDER — CARVEDILOL 12.5 MG PO TABS: 12.5000 mg | ORAL_TABLET | Freq: Two times a day (BID) | ORAL | Status: DC

## 2013-09-16 NOTE — Progress Notes (Signed)
Patient ID: Paula Booker, female   DOB: [birth year], 68 y.o.   MRN: [id number] Patient ID: Paula Booker, female   DOB: [birth year], 68 y.o.   MRN: [id number]     [place], [place],   [address] Phone: [phone] Fax:  [phone]  Date:  09/16/2013   ID:  Paula Booker, DOB 17-Nov-1945, MRN [id number]  PCP:  Antony Blackbird, MD      History of Present Illness: Paula Booker is a 68 y.o. female who has a nonischemic cardiomyopathy. She has had WPW diagnosed on ECG as well by Dr. Lovena Le. Cardiomyopathy:   c/o Shortness of breath. Denies :   Chest pain .  Palpitations  Dizziness.  Leg edema.  Orthopnea.  Paroxysmal nocturnal dyspnea.  Syncope.   She had lost weight intentionally.  SHe was using the treadmill and watching her diet. She had lost 21 lbs.  Unfortunately, she gained it all back.  She complains of easy fatiguability. She feels tired all of the time.  She cannot do the same level of activity.  SHe is unable to exercise.    Wt Readings from Last 3 Encounters:  09/16/13 209 lb 12.8 oz (95.165 kg)  03/26/13 195 lb 12.8 oz (88.814 kg)  02/21/13 197 lb (89.359 kg)     Past Medical History  Diagnosis Date  . Asthma   . GERD (gastroesophageal reflux disease)   . CHF (congestive heart failure)   . Hypertension   . ICD (implantable cardiac defibrillator) in place Dec 2013  . Shortness of breath     "@ any time I can get SOB" (03/22/2012)  . Migraines   . Arthritis     "hands and knees" (03/22/2012)  . Skin cancer 1999  . WPW (Wolff-Parkinson-White syndrome)     Current Outpatient Prescriptions  Medication Sig Dispense Refill  . albuterol (PROVENTIL HFA;VENTOLIN HFA) 108 (90 BASE) MCG/ACT inhaler Inhale 2 puffs into the lungs every 6 (six) hours as needed. For wheezing      . alendronate (FOSAMAX) 70 MG tablet Take 70 mg by mouth once a week. Take with a full glass of water on an empty stomach.      . carvedilol (COREG) 12.5 MG tablet Take 1  tablet (12.5 mg total) by mouth 2 (two) times daily with a meal.  60 tablet  3  . cetirizine (ZYRTEC) 10 MG tablet Take 10 mg by mouth daily.      . Cholecalciferol (VITAMIN D3) 1000 UNITS CAPS Take 1,000 Units by mouth daily.       . citalopram (CELEXA) 20 MG tablet Take 20 mg by mouth daily.      . furosemide (LASIX) 40 MG tablet Take 1 tablet (40 mg total) by mouth daily.  30 tablet  6  . losartan (COZAAR) 50 MG tablet Take 1 tablet (50 mg total) by mouth daily.  30 tablet  4  . Misc Natural Products (OSTEO BI-FLEX ADV DOUBLE ST PO) Take 1 tablet by mouth every morning.       . Multiple Vitamins-Minerals (MULTIVITAMIN PO) Take 1 tablet by mouth daily. ALIVE      . naproxen sodium (ALEVE) 220 MG tablet Take 440 mg by mouth daily.      Marland Kitchen omeprazole (PRILOSEC) 20 MG capsule Take 40 mg by mouth daily.      . potassium chloride (K-DUR,KLOR-CON) 10 MEQ tablet Take 10 mEq by mouth 2 (two) times daily.      . potassium chloride (MICRO-K)  10 MEQ CR capsule 2 (two) times daily.       No current facility-administered medications for this visit.    Allergies:    Allergies  Allergen Reactions  . Ivp Dye [Iodinated Diagnostic Agents] Anaphylaxis, Hives, Swelling and Other (See Comments)    Throat swell  . Penicillins Anaphylaxis  . Sulfonamide Derivatives Other (See Comments)    Migraines  . Onion Other (See Comments)    Raw onions cause migraines    Social History:  The patient  reports that she has never smoked. She has never used smokeless tobacco. She reports that she does not drink alcohol or use illicit drugs.   Family History:  The patient's family history includes Congestive Heart Failure in her brother and mother; Tuberculosis in her paternal grandfather.   ROS:  Please see the history of present illness.  No nausea, vomiting.  No fevers, chills.  No focal weakness.  No dysuria.    All other systems reviewed and negative.   PHYSICAL EXAM: VS:  BP 151/79  Pulse 74  Ht 5\' 3"  (1.6 m)   Wt 209 lb 12.8 oz (95.165 kg)  BMI 37.17 kg/m2 Well nourished, well developed, in no acute distress HEENT: normal Neck: no JVD, no carotid bruits Cardiac:  normal S1, S2; RRR;  Lungs:  clear to auscultation bilaterally, no wheezing, rhonchi or rales Abd: soft, nontender, no hepatomegaly Ext: no edema Skin: warm and dry Neuro:   no focal abnormalities noted     ASSESSMENT AND PLAN:  Cardiomyopathy /fatigue Notes: Nonischemic cardiomyopathy with defibrillator placed. Her defibrillator has not fired. She clinically does not appear to be in congestive heart failure. She has  2. Abnormal EKG  Notes: evidence of Wolff-Parkinson-White on her prior ECG. no evidence of tachycardia on exam. Now has AICD in place to check if she has had arrhythmia.  3. HTN: BP at home is in the 233-007 range systolic.  Consider aldactone if EF is still low.   4. SHOB: She states she had a negative sleep study a year ago.  Check BNP and BMet.  Check echo to see if there has been a change in her EF.    I think there is a component of stress/depression in her sx.  She has been through a lot of life stress in the last few months, which likely contributed to her weight gain.  Hopefully, this will improve after her move is done.   Signed, Mina Marble, MD, [facility] 09/16/2013 8:31 AM

## 2013-09-16 NOTE — Addendum Note (Signed)
Addended by: Eulis Foster on: 09/16/2013 08:53 AM   Modules accepted: Orders

## 2013-09-16 NOTE — Patient Instructions (Signed)
Your physician recommends that you return for lab work today for BNP and BMet.   Your physician has requested that you have an echocardiogram. Echocardiography is a painless test that uses sound waves to create images of your heart. It provides your doctor with information about the size and shape of your heart and how well your heart's chambers and valves are working. This procedure takes approximately one hour. There are no restrictions for this procedure.  Your physician wants you to follow-up in: 6 months with Dr. Irish Lack. You will receive a reminder letter in the mail two months in advance. If you don't receive a letter, please call our office to schedule the follow-up appointment.

## 2013-10-03 ENCOUNTER — Ambulatory Visit (HOSPITAL_COMMUNITY): Payer: Medicare Other | Attending: Internal Medicine | Admitting: Radiology

## 2013-10-03 ENCOUNTER — Other Ambulatory Visit (HOSPITAL_COMMUNITY): Payer: Self-pay | Admitting: Interventional Cardiology

## 2013-10-03 DIAGNOSIS — R0602 Shortness of breath: Secondary | ICD-10-CM

## 2013-10-03 DIAGNOSIS — I509 Heart failure, unspecified: Secondary | ICD-10-CM

## 2013-10-03 DIAGNOSIS — I5020 Unspecified systolic (congestive) heart failure: Secondary | ICD-10-CM

## 2013-10-03 DIAGNOSIS — I428 Other cardiomyopathies: Secondary | ICD-10-CM

## 2013-10-03 NOTE — Progress Notes (Signed)
Echocardiogram performed.  

## 2013-10-14 ENCOUNTER — Ambulatory Visit (INDEPENDENT_AMBULATORY_CARE_PROVIDER_SITE_OTHER): Payer: Medicare Other | Admitting: *Deleted

## 2013-10-14 ENCOUNTER — Encounter: Payer: Self-pay | Admitting: Internal Medicine

## 2013-10-14 ENCOUNTER — Telehealth: Payer: Self-pay | Admitting: Cardiology

## 2013-10-14 DIAGNOSIS — I509 Heart failure, unspecified: Secondary | ICD-10-CM

## 2013-10-14 DIAGNOSIS — I428 Other cardiomyopathies: Secondary | ICD-10-CM

## 2013-10-14 DIAGNOSIS — I5023 Acute on chronic systolic (congestive) heart failure: Secondary | ICD-10-CM

## 2013-10-14 LAB — MDC_IDC_ENUM_SESS_TYPE_REMOTE
Battery Voltage: 3.01 V
Brady Statistic AP VP Percent: 0.01 %
Brady Statistic AP VS Percent: 4.99 %
Brady Statistic AS VP Percent: 0.03 %
Date Time Interrogation Session: 20150706162521
HighPow Impedance: 285 Ohm
HighPow Impedance: 73 Ohm
Lead Channel Impedance Value: 513 Ohm
Lead Channel Pacing Threshold Amplitude: 0.5 V
Lead Channel Pacing Threshold Amplitude: 1 V
Lead Channel Pacing Threshold Pulse Width: 0.4 ms
Lead Channel Sensing Intrinsic Amplitude: 2.875 mV
Lead Channel Sensing Intrinsic Amplitude: 8.125 mV
Lead Channel Sensing Intrinsic Amplitude: 8.125 mV
Lead Channel Setting Pacing Amplitude: 2 V
Lead Channel Setting Pacing Amplitude: 2.5 V
Lead Channel Setting Pacing Pulse Width: 0.4 ms
Lead Channel Setting Sensing Sensitivity: 0.3 mV
MDC IDC MSMT BATTERY REMAINING LONGEVITY: 120 mo
MDC IDC MSMT LEADCHNL RA PACING THRESHOLD PULSEWIDTH: 0.4 ms
MDC IDC MSMT LEADCHNL RA SENSING INTR AMPL: 2.875 mV
MDC IDC MSMT LEADCHNL RV IMPEDANCE VALUE: 722 Ohm
MDC IDC SET ZONE DETECTION INTERVAL: 270 ms
MDC IDC SET ZONE DETECTION INTERVAL: 300 ms
MDC IDC STAT BRADY AS VS PERCENT: 94.97 %
MDC IDC STAT BRADY RA PERCENT PACED: 5 %
MDC IDC STAT BRADY RV PERCENT PACED: 0.04 %
Zone Setting Detection Interval: 350 ms
Zone Setting Detection Interval: 400 ms

## 2013-10-14 NOTE — Telephone Encounter (Signed)
Spoke with pt and reminded pt of remote transmission that is due today. Pt verbalized understanding.   

## 2013-10-14 NOTE — Progress Notes (Signed)
Remote ICD transmission.   

## 2013-10-16 ENCOUNTER — Other Ambulatory Visit: Payer: Self-pay

## 2013-10-16 MED ORDER — POTASSIUM CHLORIDE ER 10 MEQ PO CPCR: 10.0000 meq | ORAL_CAPSULE | Freq: Two times a day (BID) | ORAL | Status: DC

## 2013-10-28 ENCOUNTER — Encounter: Payer: Self-pay | Admitting: Cardiology

## 2013-11-08 ENCOUNTER — Encounter (HOSPITAL_COMMUNITY): Payer: Self-pay | Admitting: Emergency Medicine

## 2013-11-08 ENCOUNTER — Emergency Department (HOSPITAL_COMMUNITY): Payer: Medicare Other

## 2013-11-08 ENCOUNTER — Emergency Department (HOSPITAL_COMMUNITY)
Admission: EM | Admit: 2013-11-08 | Discharge: 2013-11-08 | Disposition: A | Discharge status: Home / Self Care | Payer: Medicare Other | Attending: Emergency Medicine | Admitting: Emergency Medicine

## 2013-11-08 DIAGNOSIS — I509 Heart failure, unspecified: Secondary | ICD-10-CM | POA: Diagnosis not present

## 2013-11-08 DIAGNOSIS — K219 Gastro-esophageal reflux disease without esophagitis: Secondary | ICD-10-CM | POA: Diagnosis not present

## 2013-11-08 DIAGNOSIS — I10 Essential (primary) hypertension: Secondary | ICD-10-CM | POA: Insufficient documentation

## 2013-11-08 DIAGNOSIS — IMO0002 Reserved for concepts with insufficient information to code with codable children: Secondary | ICD-10-CM | POA: Insufficient documentation

## 2013-11-08 DIAGNOSIS — Z85828 Personal history of other malignant neoplasm of skin: Secondary | ICD-10-CM | POA: Insufficient documentation

## 2013-11-08 DIAGNOSIS — Y92009 Unspecified place in unspecified non-institutional (private) residence as the place of occurrence of the external cause: Secondary | ICD-10-CM | POA: Diagnosis not present

## 2013-11-08 DIAGNOSIS — Y9389 Activity, other specified: Secondary | ICD-10-CM | POA: Diagnosis not present

## 2013-11-08 DIAGNOSIS — Z79899 Other long term (current) drug therapy: Secondary | ICD-10-CM | POA: Insufficient documentation

## 2013-11-08 DIAGNOSIS — M129 Arthropathy, unspecified: Secondary | ICD-10-CM | POA: Insufficient documentation

## 2013-11-08 DIAGNOSIS — S22009A Unspecified fracture of unspecified thoracic vertebra, initial encounter for closed fracture: Secondary | ICD-10-CM | POA: Insufficient documentation

## 2013-11-08 DIAGNOSIS — Z791 Long term (current) use of non-steroidal anti-inflammatories (NSAID): Secondary | ICD-10-CM | POA: Diagnosis not present

## 2013-11-08 DIAGNOSIS — W11XXXA Fall on and from ladder, initial encounter: Secondary | ICD-10-CM | POA: Diagnosis not present

## 2013-11-08 DIAGNOSIS — Z880 Allergy status to penicillin: Secondary | ICD-10-CM | POA: Diagnosis not present

## 2013-11-08 DIAGNOSIS — J45909 Unspecified asthma, uncomplicated: Secondary | ICD-10-CM | POA: Diagnosis not present

## 2013-11-08 DIAGNOSIS — S22000A Wedge compression fracture of unspecified thoracic vertebra, initial encounter for closed fracture: Secondary | ICD-10-CM

## 2013-11-08 DIAGNOSIS — G43009 Migraine without aura, not intractable, without status migrainosus: Secondary | ICD-10-CM | POA: Diagnosis not present

## 2013-11-08 DIAGNOSIS — Z95810 Presence of automatic (implantable) cardiac defibrillator: Secondary | ICD-10-CM | POA: Diagnosis not present

## 2013-11-08 MED ORDER — HYDROMORPHONE HCL PF 1 MG/ML IJ SOLN
1.0000 mg | INTRAMUSCULAR | Status: DC | PRN
  Administered 2013-11-08: 1 mg via INTRAVENOUS
  Filled 2013-11-08: qty 1

## 2013-11-08 MED ORDER — OXYCODONE-ACETAMINOPHEN 5-325 MG PO TABS: 1.0000 | ORAL_TABLET | Freq: Four times a day (QID) | ORAL | Status: DC | PRN

## 2013-11-08 MED ORDER — DOCUSATE SODIUM 100 MG PO CAPS: 100.0000 mg | ORAL_CAPSULE | Freq: Two times a day (BID) | ORAL | Status: DC

## 2013-11-08 NOTE — ED Notes (Signed)
Dr. Tomi Bamberger at the bedside. Patient cleared of the spine board. Patient placed in [facility].

## 2013-11-08 NOTE — ED Notes (Signed)
Per [facility], Patient was painting at home when she stood on the top of a five foot step ladder and fell off. Patient fell and hit the back of her head on a wood ramp and landed on cement underneath the wood ramp. Patient complains of left sided back pain and right breast upon palpation. No bruising noted. Patient placed on the back board. Vitals per EMS: 110/50. 70 HR. 98% on RA. Patient given 50 mcg of Fentanyl and 200 cc NS. Patient's pressured drop 80/54.  Final BP: 122/58.

## 2013-11-08 NOTE — ED Notes (Signed)
Patient returned from CT and XRAY.

## 2013-11-08 NOTE — ED Provider Notes (Signed)
CSN: [id number]     Arrival date & time 11/08/13  1121 History   First MD Initiated Contact with Patient 11/08/13 1207     Chief Complaint  Patient presents with  . Fall   HPI Patient presents to the emergency room after a fall. The patient was painting at her house. She was standing on the very top of a 5 foot stepladder. She was reaching lost her balance and fell off the ladder. The patient hit the back of her head on a wooden ramp and landed on cement underneath wood ramp. She is now having pain in the back of her head and her neck. She's also having pain in her upper back. She denies any numbness or weakness. She denies any shortness of breath or abdominal pain. She did not lose consciousness. She's not having any trouble with nausea vomiting. EMS gave the patient fentanyl, 200 mcg, and placed her on a spine board. Cervical spine collar was not applied. Past Medical History  Diagnosis Date  . Asthma   . GERD (gastroesophageal reflux disease)   . CHF (congestive heart failure)   . Hypertension   . ICD (implantable cardiac defibrillator) in place Dec 2013  . Shortness of breath     "@ any time I can get SOB" (03/22/2012)  . Migraines   . Arthritis     "hands and knees" (03/22/2012)  . Skin cancer 1999  . WPW (Wolff-Parkinson-White syndrome)    Past Surgical History  Procedure Laterality Date  . Cardiac defibrillator placement  03/22/2012    DDD  . Cholecystectomy  ~ 2003  . Tonsillectomy and adenoidectomy  1950  . Skin cancer excision  1999    "top of my head and my right back shoulder"  . Mohs surgery  06/2011    "forehead" (03/22/2012)  . Supraventricular tachycardia ablation  03/22/2012    unable to induce VT/RN report 03/22/2012  . Tubal ligation  1978   Family History  Problem Relation Age of Onset  . Tuberculosis Paternal Grandfather   . Congestive Heart Failure Brother   . Congestive Heart Failure Mother    History  Substance Use Topics  . Smoking status: Never  Smoker   . Smokeless tobacco: Never Used  . Alcohol Use: No   OB History   Grav Para Term Preterm Abortions TAB SAB Ect Mult Living                 Review of Systems  All other systems reviewed and are negative.     Allergies  Ivp dye; Penicillins; Sulfonamide derivatives; and Onion  Home Medications   Prior to Admission medications   Medication Sig Start Date End Date Taking? Authorizing Provider  albuterol (PROVENTIL HFA;VENTOLIN HFA) 108 (90 BASE) MCG/ACT inhaler Inhale 2 puffs into the lungs every 6 (six) hours as needed. For wheezing   Yes Historical Provider, MD  alendronate (FOSAMAX) 70 MG tablet Take 70 mg by mouth once a week. Take with a full glass of water on an empty stomach.   Yes Historical Provider, MD  carvedilol (COREG) 12.5 MG tablet Take 1 tablet (12.5 mg total) by mouth 2 (two) times daily with a meal. 09/16/13  Yes Jettie Booze, MD  cetirizine (ZYRTEC) 10 MG tablet Take 10 mg by mouth daily.   Yes Historical Provider, MD  Cholecalciferol (VITAMIN D3) 1000 UNITS CAPS Take 2,000 Units by mouth daily.    Yes Historical Provider, MD  citalopram (CELEXA) 20 MG tablet Take  20 mg by mouth daily.   Yes Historical Provider, MD  furosemide (LASIX) 40 MG tablet Take 1 tablet (40 mg total) by mouth daily. 05/21/13  Yes Jettie Booze, MD  gelatin 650 MG capsule Take 1,300 mg by mouth daily.   Yes Historical Provider, MD  HYDROcodone-acetaminophen (NORCO/VICODIN) 5-325 MG per tablet Take 1 tablet by mouth every 6 (six) hours as needed. For migraines 10/15/13  Yes Historical Provider, MD  losartan (COZAAR) 50 MG tablet Take 1 tablet (50 mg total) by mouth daily. 09/16/13  Yes Jettie Booze, MD  Misc Natural Products (OSTEO BI-FLEX ADV DOUBLE ST PO) Take 2 tablets by mouth every morning.    Yes Historical Provider, MD  Multiple Vitamins-Minerals (MULTIVITAMIN PO) Take 1 tablet by mouth daily. ALIVE   Yes Historical Provider, MD  naproxen sodium (ALEVE) 220 MG tablet  Take 440 mg by mouth 2 (two) times daily.    Yes Historical Provider, MD  omeprazole (PRILOSEC) 20 MG capsule Take 40 mg by mouth daily.   Yes Historical Provider, MD  potassium chloride (K-DUR,KLOR-CON) 10 MEQ tablet Take 10 mEq by mouth 2 (two) times daily.   Yes Historical Provider, MD  docusate sodium (COLACE) 100 MG capsule Take 1 capsule (100 mg total) by mouth every 12 (twelve) hours. 11/08/13   Dorie Rank, MD  oxyCODONE-acetaminophen (PERCOCET/ROXICET) 5-325 MG per tablet Take 1-2 tablets by mouth every 6 (six) hours as needed. 11/08/13   Dorie Rank, MD   BP 113/52  Pulse 68  Temp(Src) 98.1 F (36.7 C) (Oral)  Resp 16  Ht 5\' 3"  (1.6 m)  Wt 201 lb (91.173 kg)  BMI 35.61 kg/m2  SpO2 98% Physical Exam  Nursing note and vitals reviewed. Constitutional: She appears well-developed and well-nourished. No distress.  HENT:  Head: Normocephalic and atraumatic. Head is without raccoon's eyes and without Battle's sign.  Right Ear: External ear normal.  Left Ear: External ear normal.  Eyes: Conjunctivae and lids are normal. Right eye exhibits no discharge. Left eye exhibits no discharge. Right conjunctiva has no hemorrhage. Left conjunctiva has no hemorrhage. No scleral icterus.  Neck: Neck supple. No spinous process tenderness present. No tracheal deviation and no edema present.  Cardiovascular: Normal rate, regular rhythm, normal heart sounds and intact distal pulses.   Pulmonary/Chest: Effort normal and breath sounds normal. No stridor. No respiratory distress. She has no wheezes. She has no rales. She exhibits no tenderness, no crepitus and no deformity.  Abdominal: Soft. Normal appearance and bowel sounds are normal. She exhibits no distension and no mass. There is no tenderness. There is no rebound and no guarding.  Negative for abdominal bruising  Musculoskeletal: She exhibits no edema.       Cervical back: She exhibits tenderness and bony tenderness. She exhibits no swelling and no  deformity.       Thoracic back: She exhibits tenderness and bony tenderness. She exhibits no swelling and no deformity.       Lumbar back: She exhibits no tenderness and no swelling.  Pelvis stable, no ttp  Neurological: She is alert. She has normal strength. No cranial nerve deficit (no facial droop, extraocular movements intact, no slurred speech) or sensory deficit. She exhibits normal muscle tone. She displays no seizure activity. Coordination normal. GCS eye subscore is 4. GCS verbal subscore is 5. GCS motor subscore is 6.  Able to move all extremities, sensation intact throughout  Skin: Skin is warm and dry. No rash noted. She is not diaphoretic.  Psychiatric: She has a normal mood and affect. Her speech is normal and behavior is normal.    ED Course  Procedures  Cervical spine collar was applied. The patient was removed from the backboard using the logroll maneuver. Labs Review Labs Reviewed - No data to display  Imaging Review Dg Chest 1 View  11/08/2013   CLINICAL DATA:  [place] off ladder, LEFT back and chest pain  EXAM: CHEST - 1 VIEW  COMPARISON:  07/26/2013  FINDINGS: LEFT subclavian transvenous pacemaker/AICD leads project over RIGHT atrium and RIGHT ventricle, unchanged.  Enlargement of cardiac silhouette.  Mediastinal contours and pulmonary vascularity normal.  Lungs clear.  No pleural effusion or pneumothorax.  Bones appear diffusely demineralized without identified acute abnormality.  IMPRESSION: Enlargement of cardiac silhouette post AICD.  No acute abnormalities.   Electronically Signed   By: Lavonia Dana M.D.   On: 11/08/2013 13:54   Dg Thoracic Spine 2 View  11/08/2013   CLINICAL DATA:  Back pain after fall.  EXAM: THORACIC SPINE - 2 VIEW  COMPARISON:  CT scan of April 25, 2012.  FINDINGS: Mild levoscoliosis of upper thoracic spine is noted most likely positional in origin. Mild wedge compression deformity of the T9 and T11 vertebral bodies is noted concerning for acute  compression fractures. CT scan is recommended for further evaluation. No significant spondylolisthesis is noted.  IMPRESSION: Mild wedge compression deformity of T9 and T11 vertebral bodies is noted concerning for acute compression fractures. CT scan is recommended for further evaluation.   Electronically Signed   By: Sabino Dick M.D.   On: 11/08/2013 13:55   Ct Head Wo Contrast  11/08/2013   CLINICAL DATA:  Fall, hitting back of head, left back pain  EXAM: CT HEAD WITHOUT CONTRAST  CT CERVICAL SPINE WITHOUT CONTRAST  TECHNIQUE: Multidetector CT imaging of the head and cervical spine was performed following the standard protocol without intravenous contrast. Multiplanar CT image reconstructions of the cervical spine were also generated.  COMPARISON:  None.  FINDINGS: CT HEAD FINDINGS  No evidence of parenchymal hemorrhage or extra-axial fluid collection. No mass lesion, mass effect, or midline shift.  No CT evidence of acute infarction.  Cerebral volume is within normal limits.  No ventriculomegaly.  The visualized paranasal sinuses are essentially clear. The mastoid air cells are unopacified.  No evidence of calvarial fracture.  CT CERVICAL SPINE FINDINGS  Loss of the normal cervical lordosis.  No evidence of fracture dislocation. Vertebral body heights are maintained. Dens appears intact.  No prevertebral soft tissue swelling.  Mild degenerative changes of the mid cervical spine.  Dens appears intact.  Visualized lung apices are notable for mild biapical pleural parenchymal scarring.  IMPRESSION: No evidence of acute intracranial abnormality.  No evidence of traumatic injury to the cervical spine.   Electronically Signed   By: Julian Hy M.D.   On: 11/08/2013 13:39   Ct Cervical Spine Wo Contrast  11/08/2013   CLINICAL DATA:  Fall, hitting back of head, left back pain  EXAM: CT HEAD WITHOUT CONTRAST  CT CERVICAL SPINE WITHOUT CONTRAST  TECHNIQUE: Multidetector CT imaging of the head and cervical  spine was performed following the standard protocol without intravenous contrast. Multiplanar CT image reconstructions of the cervical spine were also generated.  COMPARISON:  None.  FINDINGS: CT HEAD FINDINGS  No evidence of parenchymal hemorrhage or extra-axial fluid collection. No mass lesion, mass effect, or midline shift.  No CT evidence of acute infarction.  Cerebral volume is within  normal limits.  No ventriculomegaly.  The visualized paranasal sinuses are essentially clear. The mastoid air cells are unopacified.  No evidence of calvarial fracture.  CT CERVICAL SPINE FINDINGS  Loss of the normal cervical lordosis.  No evidence of fracture dislocation. Vertebral body heights are maintained. Dens appears intact.  No prevertebral soft tissue swelling.  Mild degenerative changes of the mid cervical spine.  Dens appears intact.  Visualized lung apices are notable for mild biapical pleural parenchymal scarring.  IMPRESSION: No evidence of acute intracranial abnormality.  No evidence of traumatic injury to the cervical spine.   Electronically Signed   By: Julian Hy M.D.   On: 11/08/2013 13:39   Ct Thoracic Spine Wo Contrast  11/08/2013   CLINICAL DATA:  Fall.  EXAM: CT THORACIC SPINE WITHOUT CONTRAST  TECHNIQUE: Multidetector CT imaging of the thoracic spine was performed without intravenous contrast administration. Multiplanar CT image reconstructions were also generated.  COMPARISON:  Chest CT 04/25/2012  FINDINGS: There is liver subtle biphasic curvature of the thoracolumbar spine. There is mild spondylosis throughout the thoracic spine. There are mild compression fractures of T9 and T11 which are new compared to the previous CT from 04/25/2012 and may be acute. There is no free canal fragments or significant retropulsion of the posterior border of the compression fractures. There is no canal stenosis. Remainder of the exam is unremarkable.  IMPRESSION: Mild spondylosis of the thoracic spine with mild  compression fractures of T9 and T11 new compared to the prior exam from 04/25/2012 and likely acute. No free canal fragment or retropulsion of the posterior border of the compression fractures.   Electronically Signed   By: Marin Olp M.D.   On: 11/08/2013 15:26   Dg Shoulder Left  11/08/2013   CLINICAL DATA:  [place] from ladder onto back today, LEFT neck and shoulder soreness  EXAM: LEFT SHOULDER - 2+ VIEW  COMPARISON:  None  FINDINGS: Osseous demineralization.  LEFT subclavian pacemaker generator noted.  AC joint alignment normal.  No acute fracture, dislocation, or bone destruction.  Visualized LEFT ribs unremarkable.  IMPRESSION: No acute osseous abnormalities.   Electronically Signed   By: Lavonia Dana M.D.   On: 11/08/2013 15:24    Medications  HYDROmorphone (DILAUDID) injection 1 mg (1 mg Intravenous Given 11/08/13 1238)     MDM   Final diagnoses:  Compression fracture of thoracic vertebra, closed, initial encounter   Compression fractures without retropulsion of t9 and t11.  No other injuries noted on CT and xray.  Discussed with Dr Cyndy Freeze.  Will follow up in the office.  No need for a brace.     Dorie Rank, MD 11/08/13 [phone]

## 2013-11-08 NOTE — ED Notes (Signed)
Dr. Knapp at the bedside.  

## 2013-11-08 NOTE — Discharge Instructions (Signed)
Vertebral Fracture °A vertebral fracture is when one or more bones in the spine are broken (fractured). These bones are called vertebra. You may have pain and be stiff for 3 to 6 weeks.  °HOME CARE  °· Rest in bed for a few days. °· Take pain medicine as told by your doctor. °· Slowly return to activity as told by your doctor. °· Ask your doctor if any physical therapy is needed. °GET HELP RIGHT AWAY IF:  °· Your pain gets worse. °· You throw up (vomit). °· You cannot move around at all. °· You have numbness, tingling, weakness, or cannot move any part of your body. °· You cannot control your poop (bowel movements) or pee (urination). °· You have chest or belly (abdominal) pain, coughing, or trouble breathing. °· You have a temperature by mouth above 102° F (38.9° C), not controlled by medicine. °MAKE SURE YOU:  °· Understand these instructions. °· Will watch your condition. °· Will get help right away if you are not doing well or get worse. °Document Released: 09/15/2009 Document Revised: 01/16/2013 Document Reviewed: 09/15/2009 °Exit[employer]® Patient Information ©2015 [employer]. This information is not intended to replace advice given to you by your health care provider. Make sure you discuss any questions you have with your health care provider. ° °

## 2013-11-22 ENCOUNTER — Other Ambulatory Visit: Payer: Self-pay | Admitting: Orthopedic Surgery

## 2013-11-22 ENCOUNTER — Encounter (HOSPITAL_COMMUNITY): Payer: Self-pay | Admitting: Pharmacy Technician

## 2013-11-25 NOTE — Pre-Procedure Instructions (Addendum)
Paula Booker  11/25/2013   Your procedure is scheduled on:  Wednesday, August 19th  Report to [facility] Admitting at 1100 AM.  Call this number if you have problems the morning of surgery: [phone]   Remember:   Do not eat food or drink liquids after midnight.   Take these medicines the morning of surgery with A SIP OF WATER: coreg, zyrtec, prilosec, celexa, albuterol if needed, pain medication if needed     STOP all herbel meds, nsaids (aleve,naproxen,advil,ibuprofen) now including vitamins, aspirin,osteo biflex    Do not wear jewelry, make-up or nail polish.  Do not wear lotions, powders, or perfumes. You may wear deodorant.  Do not shave 48 hours prior to surgery. Men may shave face and neck.  Do not bring valuables to the hospital.  [facility] is not responsible  for any belongings or valuables.               Contacts, dentures or bridgework may not be worn into surgery.  Leave suitcase in the car. After surgery it may be brought to your room.  For patients admitted to the hospital, discharge time is determined by your treatment team.               Patients discharged the day of surgery will not be allowed to drive home.  Please read over the following fact sheets that you were given: surgical site infections, pain management [facility] - Preparing for Surgery  Before surgery, you can play an important role.  Because skin is not sterile, your skin needs to be as free of germs as possible.  You can reduce the number of germs on you skin by washing with CHG (chlorahexidine gluconate) soap before surgery.  CHG is an antiseptic cleaner which kills germs and bonds with the skin to continue killing germs even after washing.  Please DO NOT use if you have an allergy to CHG or antibacterial soaps.  If your skin becomes reddened/irritated stop using the CHG and inform your nurse when you arrive at Short Stay.  Do not shave (including legs and underarms) for at least 48  hours prior to the first CHG shower.  You may shave your face.  Please follow these instructions carefully:   1.  Shower with CHG Soap the night before surgery and the morning of Surgery.  2.  If you choose to wash your hair, wash your hair first as usual with your normal shampoo.  3.  After you shampoo, rinse your hair and body thoroughly to remove the shampoo.  4.  Use CHG as you would any other liquid soap.  You can apply CHG directly to the skin and wash gently with scrungie or a clean washcloth.  5.  Apply the CHG Soap to your body ONLY FROM THE NECK DOWN.  Do not use on open wounds or open sores.  Avoid contact with your eyes, ears, mouth and genitals (private parts).  Wash genitals (private parts) with your normal soap.  6.  Wash thoroughly, paying special attention to the area where your surgery will be performed.  7.  Thoroughly rinse your body with warm water from the neck down.  8.  DO NOT shower/wash with your normal soap after using and rinsing off the CHG Soap.  9.  Pat yourself dry with a clean towel.            10.  Wear clean pajamas.  11.  Place clean sheets on your bed the night of your first shower and do not sleep with pets.  Day of Surgery  Do not apply any lotions/deoderants the morning of surgery.  Please wear clean clothes to the hospital/surgery center.

## 2013-11-26 ENCOUNTER — Encounter (HOSPITAL_COMMUNITY)
Admission: RE | Admit: 2013-11-26 | Discharge: 2013-11-26 | Disposition: A | Discharge status: Home / Self Care | Payer: Medicare Other | Source: Ambulatory Visit | Attending: Orthopedic Surgery | Admitting: Orthopedic Surgery

## 2013-11-26 ENCOUNTER — Encounter (HOSPITAL_COMMUNITY): Payer: Self-pay

## 2013-11-26 ENCOUNTER — Encounter (HOSPITAL_COMMUNITY)
Admission: RE | Admit: 2013-11-26 | Discharge: 2013-11-26 | Disposition: A | Discharge status: Home / Self Care | Payer: Medicare Other | Source: Ambulatory Visit | Attending: Anesthesiology | Admitting: Anesthesiology

## 2013-11-26 DIAGNOSIS — Z95810 Presence of automatic (implantable) cardiac defibrillator: Secondary | ICD-10-CM | POA: Diagnosis not present

## 2013-11-26 DIAGNOSIS — W19XXXA Unspecified fall, initial encounter: Secondary | ICD-10-CM | POA: Diagnosis not present

## 2013-11-26 DIAGNOSIS — R51 Headache: Secondary | ICD-10-CM | POA: Diagnosis not present

## 2013-11-26 DIAGNOSIS — Z9089 Acquired absence of other organs: Secondary | ICD-10-CM | POA: Diagnosis not present

## 2013-11-26 DIAGNOSIS — I10 Essential (primary) hypertension: Secondary | ICD-10-CM | POA: Diagnosis not present

## 2013-11-26 DIAGNOSIS — K219 Gastro-esophageal reflux disease without esophagitis: Secondary | ICD-10-CM | POA: Diagnosis not present

## 2013-11-26 DIAGNOSIS — Z7983 Long term (current) use of bisphosphonates: Secondary | ICD-10-CM | POA: Diagnosis not present

## 2013-11-26 DIAGNOSIS — J45909 Unspecified asthma, uncomplicated: Secondary | ICD-10-CM | POA: Diagnosis not present

## 2013-11-26 DIAGNOSIS — Z79899 Other long term (current) drug therapy: Secondary | ICD-10-CM | POA: Diagnosis not present

## 2013-11-26 DIAGNOSIS — S22009A Unspecified fracture of unspecified thoracic vertebra, initial encounter for closed fracture: Secondary | ICD-10-CM | POA: Diagnosis not present

## 2013-11-26 DIAGNOSIS — I509 Heart failure, unspecified: Secondary | ICD-10-CM | POA: Diagnosis not present

## 2013-11-26 DIAGNOSIS — Z8701 Personal history of pneumonia (recurrent): Secondary | ICD-10-CM | POA: Diagnosis not present

## 2013-11-26 DIAGNOSIS — Z85828 Personal history of other malignant neoplasm of skin: Secondary | ICD-10-CM | POA: Diagnosis not present

## 2013-11-26 HISTORY — DX: Presence of cardiac pacemaker: Z95.0

## 2013-11-26 HISTORY — DX: Pneumonia, unspecified organism: J18.9

## 2013-11-26 HISTORY — DX: Anxiety disorder, unspecified: F41.9

## 2013-11-26 LAB — BASIC METABOLIC PANEL
Anion gap: 11 (ref 5–15)
BUN: 21 mg/dL (ref 6–23)
CHLORIDE: 102 meq/L (ref 96–112)
CO2: 28 meq/L (ref 19–32)
Calcium: 9.6 mg/dL (ref 8.4–10.5)
Creatinine, Ser: 0.75 mg/dL (ref 0.50–1.10)
GFR calc Af Amer: >90 mL/min (ref >90)
GFR calc non Af Amer: 86 mL/min — ABNORMAL LOW (ref >90)
GLUCOSE: 98 mg/dL (ref 70–99)
Potassium: 4.6 mEq/L (ref 3.7–5.3)
SODIUM: 141 meq/L (ref 137–147)

## 2013-11-26 LAB — CBC
HCT: 42.1 % (ref 36.0–46.0)
Hemoglobin: 14 g/dL (ref 12.0–15.0)
MCH: 30.6 pg (ref 26.0–34.0)
MCHC: 33.3 g/dL (ref 30.0–36.0)
MCV: 92.1 fL (ref 78.0–100.0)
PLATELETS: 290 10*3/uL (ref 150–400)
RBC: 4.57 MIL/uL (ref 3.87–5.11)
RDW: 12.9 % (ref 11.5–15.5)
WBC: 8.2 10*3/uL (ref 4.0–10.5)

## 2013-11-26 NOTE — Progress Notes (Signed)
Anesthesia chart review:  Patient is a 68 year old female scheduled for T9, T11 kyphoplasty tomorrow by Dr. Lynann Bologna.   History includes non-smoker, non-ischemic cardiomyopathy, chronic systolic CHF, recurrent syncope with WPW pattern on EKG s/p EPS showing no inducible SVT/VT s/p Medtronic dual chamber ICD insertion 03/22/12, HTN, GERD, asthma, arthritis, anxiety, skin cancer s/p Mohs, migraines, cholecystectomy, T&A. BMI is consistent with obesity.  Cardiologist is Dr. Irish Lack, last visit with echo in 09/2013. EP cardiologist is Dr. Lovena Le, last visit 03/2013 with remote ICD transmission on 10/14/13.   Echo on 10/03/13 showed: - Left ventricle: LVEF is depressed at approximately 25% with diffuse hypokinesis. The cavity size was severely dilated. Wall thickness was normal. - Left atrium: The atrium was mildly dilated. - Pulmonary arteries: PA peak pressure: 36 mm Hg (S). - Trivial mitral and tricuspid regurgitation. Continued medical therapy was recommended. (Previous EF on 02/20/2012 was 20-25%.)  Cardiac cath on 09/12/11 showed: 1. No significant coronary artery disease (angiographically normal). Left dominant system. 2. Severe, global left ventricular systolic dysfunction. LVEDP 28 mmHg. Ejection fraction 20-25 %. 3. Moderate pulmonary hypertension. Cardiac index greater than 2.  EKG on 07/26/13 showed: SR, occasional PVC, incomplete left BBB, borderline low voltage in extremity leads, consider anterior infarct (old). PVC is new, high lateral T wave abnormality is less apparent when compared to prior EKG on 04/20/12.  PFT's 10/17/2012 PFTs 10/17/2012 FEV1 1.68 (75%) ratio 73 and no change p B2, DLCO 93.  CXR on 11/26/13 showed: The heart size and mediastinal contours are within normal limits. Both lungs are clear. Left-sided pacemaker is unchanged in position. No pneumothorax or pleural effusion is noted. Stable compression deformities of T9 and T11 vertebral bodies are noted consistent with old  fractures. No acute cardiopulmonary abnormality see.   Preoperative labs noted.    She denied any new cardiopulmonary issues at her PAT visit.  She was seen by her primary cardiologist within the past three months with stable echo. She had normal coronaries by cath just over two years ago.  Her ICD was interrogated last month.  If no acute changes then I would anticipate that she could proceed as planned.  George Hugh [facility] Short Stay Center/Anesthesiology Phone [phone] 11/26/2013 1:26 PM

## 2013-11-26 NOTE — Progress Notes (Addendum)
Office called, message left for Paula Booker to have orders released.  medtronic called to have rep call for info for surgery tomorrow -to turn off icd Spoke with Miles Costain- to d/c icd am of surgery

## 2013-11-27 ENCOUNTER — Encounter (HOSPITAL_COMMUNITY): Payer: Medicare Other | Admitting: Vascular Surgery

## 2013-11-27 ENCOUNTER — Ambulatory Visit (HOSPITAL_COMMUNITY)
Admission: RE | Admit: 2013-11-27 | Discharge: 2013-11-27 | Disposition: A | Discharge status: Home / Self Care | Payer: Medicare Other | Source: Ambulatory Visit | Attending: Orthopedic Surgery | Admitting: Orthopedic Surgery

## 2013-11-27 ENCOUNTER — Encounter (HOSPITAL_COMMUNITY)
Admission: RE | Disposition: A | Discharge status: Home / Self Care | Payer: Self-pay | Source: Ambulatory Visit | Attending: Orthopedic Surgery

## 2013-11-27 ENCOUNTER — Encounter (HOSPITAL_COMMUNITY): Payer: Self-pay | Admitting: Anesthesiology

## 2013-11-27 ENCOUNTER — Ambulatory Visit (HOSPITAL_COMMUNITY): Payer: Medicare Other | Admitting: Anesthesiology

## 2013-11-27 ENCOUNTER — Ambulatory Visit (HOSPITAL_COMMUNITY): Payer: Medicare Other

## 2013-11-27 DIAGNOSIS — Z79899 Other long term (current) drug therapy: Secondary | ICD-10-CM | POA: Insufficient documentation

## 2013-11-27 DIAGNOSIS — J45909 Unspecified asthma, uncomplicated: Secondary | ICD-10-CM | POA: Insufficient documentation

## 2013-11-27 DIAGNOSIS — K219 Gastro-esophageal reflux disease without esophagitis: Secondary | ICD-10-CM | POA: Diagnosis not present

## 2013-11-27 DIAGNOSIS — Z9089 Acquired absence of other organs: Secondary | ICD-10-CM | POA: Insufficient documentation

## 2013-11-27 DIAGNOSIS — I509 Heart failure, unspecified: Secondary | ICD-10-CM | POA: Insufficient documentation

## 2013-11-27 DIAGNOSIS — I10 Essential (primary) hypertension: Secondary | ICD-10-CM | POA: Diagnosis not present

## 2013-11-27 DIAGNOSIS — Z7983 Long term (current) use of bisphosphonates: Secondary | ICD-10-CM | POA: Insufficient documentation

## 2013-11-27 DIAGNOSIS — S22009A Unspecified fracture of unspecified thoracic vertebra, initial encounter for closed fracture: Secondary | ICD-10-CM | POA: Insufficient documentation

## 2013-11-27 DIAGNOSIS — Z8701 Personal history of pneumonia (recurrent): Secondary | ICD-10-CM | POA: Insufficient documentation

## 2013-11-27 DIAGNOSIS — Z95810 Presence of automatic (implantable) cardiac defibrillator: Secondary | ICD-10-CM | POA: Insufficient documentation

## 2013-11-27 DIAGNOSIS — R51 Headache: Secondary | ICD-10-CM | POA: Insufficient documentation

## 2013-11-27 DIAGNOSIS — Z85828 Personal history of other malignant neoplasm of skin: Secondary | ICD-10-CM | POA: Insufficient documentation

## 2013-11-27 DIAGNOSIS — W19XXXA Unspecified fall, initial encounter: Secondary | ICD-10-CM | POA: Insufficient documentation

## 2013-11-27 HISTORY — PX: KYPHOPLASTY: SHX5884

## 2013-11-27 SURGERY — KYPHOPLASTY
Anesthesia: General | Site: Spine Thoracic

## 2013-11-27 MED ORDER — PROPOFOL 10 MG/ML IV BOLUS
INTRAVENOUS | Status: DC | PRN
  Administered 2013-11-27: 150 mg via INTRAVENOUS

## 2013-11-27 MED ORDER — HYDROMORPHONE HCL PF 1 MG/ML IJ SOLN
0.2500 mg | INTRAMUSCULAR | Status: DC | PRN
  Administered 2013-11-27 (×2): 0.5 mg via INTRAVENOUS

## 2013-11-27 MED ORDER — BUPIVACAINE-EPINEPHRINE 0.25% -1:200000 IJ SOLN: INTRAMUSCULAR | Status: DC | PRN

## 2013-11-27 MED ORDER — DIAZEPAM 5 MG PO TABS
5.0000 mg | ORAL_TABLET | Freq: Once | ORAL | Status: AC
  Administered 2013-11-27: 5 mg via ORAL

## 2013-11-27 MED ORDER — HYDROMORPHONE HCL PF 1 MG/ML IJ SOLN
INTRAMUSCULAR | Status: AC
  Filled 2013-11-27: qty 1

## 2013-11-27 MED ORDER — OXYCODONE HCL 5 MG/5ML PO SOLN: 5.0000 mg | Freq: Once | ORAL | Status: AC | PRN

## 2013-11-27 MED ORDER — ROCURONIUM BROMIDE 100 MG/10ML IV SOLN
INTRAVENOUS | Status: DC | PRN
  Administered 2013-11-27: 40 mg via INTRAVENOUS

## 2013-11-27 MED ORDER — VANCOMYCIN HCL IN DEXTROSE 1-5 GM/200ML-% IV SOLN
INTRAVENOUS | Status: AC
  Filled 2013-11-27: qty 200

## 2013-11-27 MED ORDER — PROMETHAZINE HCL 25 MG/ML IJ SOLN: 6.2500 mg | INTRAMUSCULAR | Status: DC | PRN

## 2013-11-27 MED ORDER — POVIDONE-IODINE 7.5 % EX SOLN: Freq: Once | CUTANEOUS | Status: DC

## 2013-11-27 MED ORDER — OXYCODONE-ACETAMINOPHEN 5-325 MG PO TABS
ORAL_TABLET | ORAL | Status: AC
  Administered 2013-11-27: 1
  Filled 2013-11-27: qty 1

## 2013-11-27 MED ORDER — VANCOMYCIN HCL IN DEXTROSE 1-5 GM/200ML-% IV SOLN: 1000.0000 mg | INTRAVENOUS | Status: DC

## 2013-11-27 MED ORDER — PROPOFOL 10 MG/ML IV BOLUS
INTRAVENOUS | Status: AC
  Filled 2013-11-27: qty 20

## 2013-11-27 MED ORDER — MIDAZOLAM HCL 2 MG/2ML IJ SOLN
INTRAMUSCULAR | Status: AC
  Filled 2013-11-27: qty 2

## 2013-11-27 MED ORDER — OXYCODONE HCL 5 MG PO TABS
5.0000 mg | ORAL_TABLET | Freq: Once | ORAL | Status: AC | PRN
  Administered 2013-11-27: 5 mg via ORAL

## 2013-11-27 MED ORDER — DIAZEPAM 5 MG PO TABS
ORAL_TABLET | ORAL | Status: AC
  Filled 2013-11-27: qty 1

## 2013-11-27 MED ORDER — LACTATED RINGERS IV SOLN
INTRAVENOUS | Status: DC | PRN
  Administered 2013-11-27: 15:00:00 via INTRAVENOUS

## 2013-11-27 MED ORDER — BACITRACIN ZINC 500 UNIT/GM EX OINT
TOPICAL_OINTMENT | CUTANEOUS | Status: AC
  Filled 2013-11-27: qty 15

## 2013-11-27 MED ORDER — FENTANYL CITRATE 0.05 MG/ML IJ SOLN
INTRAMUSCULAR | Status: AC
  Filled 2013-11-27: qty 5

## 2013-11-27 MED ORDER — GLYCOPYRROLATE 0.2 MG/ML IJ SOLN
INTRAMUSCULAR | Status: DC | PRN
  Administered 2013-11-27: .7 mg via INTRAVENOUS

## 2013-11-27 MED ORDER — IOHEXOL 300 MG/ML  SOLN
INTRAMUSCULAR | Status: DC | PRN
  Administered 2013-11-27: 100 mL

## 2013-11-27 MED ORDER — OXYCODONE HCL 5 MG PO TABS
ORAL_TABLET | ORAL | Status: AC
  Filled 2013-11-27: qty 1

## 2013-11-27 MED ORDER — LACTATED RINGERS IV SOLN
INTRAVENOUS | Status: DC
  Administered 2013-11-27: 12:00:00 via INTRAVENOUS

## 2013-11-27 MED ORDER — NEOSTIGMINE METHYLSULFATE 10 MG/10ML IV SOLN
INTRAVENOUS | Status: DC | PRN
  Administered 2013-11-27: 4 mg via INTRAVENOUS

## 2013-11-27 MED ORDER — BUPIVACAINE-EPINEPHRINE (PF) 0.25% -1:200000 IJ SOLN
INTRAMUSCULAR | Status: AC
  Filled 2013-11-27: qty 30

## 2013-11-27 MED ORDER — FENTANYL CITRATE 0.05 MG/ML IJ SOLN
INTRAMUSCULAR | Status: DC | PRN
Start: 2013-11-27 — End: 2013-11-27
  Administered 2013-11-27: 100 ug via INTRAVENOUS

## 2013-11-27 MED ORDER — LIDOCAINE HCL (CARDIAC) 20 MG/ML IV SOLN
INTRAVENOUS | Status: DC | PRN
  Administered 2013-11-27: 50 mg via INTRAVENOUS

## 2013-11-27 SURGICAL SUPPLY — 44 items
ADH SKN CLS APL DERMABOND .7 (GAUZE/BANDAGES/DRESSINGS) ×1
BANDAGE ADH SHEER 1  50/CT (GAUZE/BANDAGES/DRESSINGS) ×1 IMPLANT
BLADE SURG 15 STRL LF DISP TIS (BLADE) ×1 IMPLANT
BLADE SURG 15 STRL SS (BLADE) ×2
CEMENT BONE KYPHX HV R (Orthopedic Implant) ×2 IMPLANT
CEMENT KYPHON C01A KIT/MIXER (Cement) ×3 IMPLANT
COVER MAYO STAND STRL (DRAPES) ×2 IMPLANT
CURETTE WEDGE 8.5MM KYPHX (MISCELLANEOUS) IMPLANT
DERMABOND ADVANCED (GAUZE/BANDAGES/DRESSINGS) ×1
DERMABOND ADVANCED .7 DNX12 (GAUZE/BANDAGES/DRESSINGS) ×1 IMPLANT
DRAPE C-ARM 42X72 X-RAY (DRAPES) ×4 IMPLANT
DRAPE INCISE IOBAN 66X45 STRL (DRAPES) ×2 IMPLANT
DRAPE LAPAROTOMY T 102X78X121 (DRAPES) ×2 IMPLANT
DRAPE SURG 17X23 STRL (DRAPES) ×7 IMPLANT
DURAPREP 26ML APPLICATOR (WOUND CARE) ×2 IMPLANT
GAUZE SPONGE 4X4 16PLY XRAY LF (GAUZE/BANDAGES/DRESSINGS) ×2 IMPLANT
GLOVE BIO SURGEON STRL SZ7 (GLOVE) ×2 IMPLANT
GLOVE BIO SURGEON STRL SZ8 (GLOVE) ×2 IMPLANT
GLOVE BIOGEL PI IND STRL 7.0 (GLOVE) ×1 IMPLANT
GLOVE BIOGEL PI IND STRL 8 (GLOVE) ×1 IMPLANT
GLOVE BIOGEL PI INDICATOR 7.0 (GLOVE) ×1
GLOVE BIOGEL PI INDICATOR 8 (GLOVE) ×1
GOWN STRL REUS W/ TWL LRG LVL3 (GOWN DISPOSABLE) ×2 IMPLANT
GOWN STRL REUS W/ TWL XL LVL3 (GOWN DISPOSABLE) ×1 IMPLANT
GOWN STRL REUS W/TWL LRG LVL3 (GOWN DISPOSABLE) ×4
GOWN STRL REUS W/TWL XL LVL3 (GOWN DISPOSABLE) ×4
KIT BASIN OR (CUSTOM PROCEDURE TRAY) ×2 IMPLANT
KIT ROOM TURNOVER OR (KITS) ×2 IMPLANT
NDL HYPO 25X1 1.5 SAFETY (NEEDLE) ×1 IMPLANT
NDL SPNL 18GX3.5 QUINCKE PK (NEEDLE) ×2 IMPLANT
NEEDLE 22X1 1/2 (OR ONLY) (NEEDLE) ×2 IMPLANT
NEEDLE HYPO 25X1 1.5 SAFETY (NEEDLE) ×2 IMPLANT
NEEDLE SPNL 18GX3.5 QUINCKE PK (NEEDLE) ×4 IMPLANT
NS IRRIG 1000ML POUR BTL (IV SOLUTION) ×2 IMPLANT
PACK SURGICAL SETUP 50X90 (CUSTOM PROCEDURE TRAY) ×2 IMPLANT
PACK UNIVERSAL I (CUSTOM PROCEDURE TRAY) ×2 IMPLANT
PAD ARMBOARD 7.5X6 YLW CONV (MISCELLANEOUS) ×4 IMPLANT
POSITIONER HEAD PRONE TRACH (MISCELLANEOUS) ×2 IMPLANT
SUT MNCRL AB 4-0 PS2 18 (SUTURE) ×2 IMPLANT
SYR BULB IRRIGATION 50ML (SYRINGE) ×2 IMPLANT
SYR CONTROL 10ML LL (SYRINGE) ×2 IMPLANT
TOWEL OR 17X24 6PK STRL BLUE (TOWEL DISPOSABLE) ×2 IMPLANT
TOWEL OR 17X26 10 PK STRL BLUE (TOWEL DISPOSABLE) ×2 IMPLANT
TRAY KYPHOPAK 15/3 ONESTEP 1ST (MISCELLANEOUS) ×2 IMPLANT

## 2013-11-27 NOTE — Op Note (Signed)
Paula Booker, Paula Booker NO.:  [id number]  MEDICAL RECORD NO.:  [id number]  LOCATION:  [facility]                         FACILITY:  [place]  PHYSICIAN:  Phylliss Bob, MD      DATE OF BIRTH:  11/05/1945  DATE OF PROCEDURE:  11/27/2013 DATE OF DISCHARGE:  11/27/2013                              OPERATIVE REPORT   PREOPERATIVE DIAGNOSIS:  T9, T11 compression fracture.  POSTOPERATIVE DIAGNOSIS:  T9, T11 compression fracture.  PROCEDURE:  Kyphoplasty T9, T11.  SURGEON:  Phylliss Bob, MD  ASSISTANT:  None.  ANESTHESIA:  General endotracheal anesthesia.  COMPLICATIONS:  None.  DISPOSITION:  Stable.  ESTIMATED BLOOD LOSS:  Minimal.  INDICATIONS FOR SURGERY:  Briefly, [name] is a pleasant 68 year old female who did present to me with substantial pain in her back after a fall.  She did feel that her pain was severe.  At the time of my evaluation with the patient, I did review a CAT scan, which was very notable for an acute T9 and T11 compression fracture.  Given the patient's ongoing and severe pain, we did discuss proceeding with a kyphoplasty procedure.  The patient did fully understand that any pain related to her fracture would likely resolve, but any pain unrelated to her fracture would not.  She did elect to proceed.  OPERATIVE DETAILS:  On November 27, 2013, the patient was brought to surgery and general endotracheal anesthesia was administered.  The patient was placed prone on a well-padded flat Jackson bed.  Gel rolls were placed into the patient's chest and hips.  All bony prominences were padded.  The back was prepped and draped.  I then brought in AP and lateral fluoroscopy.  I then made 2 stab incisions at the superior and lateral aspect of the pedicles at T9 and T11.  Jamshidi's were advanced across the pedicles at T11, and an extra pedicular approach was utilized at T9.  I then infiltrated the kyphoplasty balloons at each level.  Of note, the  balloon pressures did elevate up to 400 with approximately 1 mL of injected contrast in each balloon.  I did curette the vertebral body before inflating the balloons.  I then introduced approximately 2 mL of cement in each cannula at both the T9 and T11 bodies, with 2 cannulas in each vertebral body, I did inject approximately 4 mL of cement in each vertebral body.  I did note excellent interdigitation of the cement.  There was no worrisome extravasation of the cement anteriorly, laterally, or posteriorly.  The cannulas were then removed.  The wound was then closed using 3-0 nylon. Bacitracin was applied followed by Band-Aids.  All instrument counts were correct at the termination of the procedure.     Phylliss Bob, MD     MD/MEDQ  D:  11/27/2013  T:  11/27/2013  Job:  [id number]  cc:   Antony Blackbird, MD

## 2013-11-27 NOTE — Discharge Instructions (Signed)

## 2013-11-27 NOTE — Transfer of Care (Signed)
Immediate Anesthesia Transfer of Care Note  Patient: Paula Booker  Procedure(s) Performed: Procedure(s) with comments: KYPHOPLASTY (N/A) - T9, T11 kyphoplasty  Patient Location: PACU  Anesthesia Type:General  Level of Consciousness: awake, alert  and oriented  Airway & Oxygen Therapy: Patient Spontanous Breathing and Patient connected to nasal cannula oxygen  Post-op Assessment: Report given to PACU RN and Post -op Vital signs reviewed and stable  Post vital signs: Reviewed and stable  Complications: No apparent anesthesia complications

## 2013-11-27 NOTE — Progress Notes (Signed)
medtronic rep/jill in....icd ON

## 2013-11-27 NOTE — Anesthesia Preprocedure Evaluation (Signed)
Anesthesia Evaluation  Patient identified by MRN, date of birth, ID band Patient awake    Reviewed: Allergy & Precautions, H&P , NPO status , Patient's Chart, lab work & pertinent test results  Airway Mallampati: II  Neck ROM: Full    Dental  (+) Partial Lower   Pulmonary shortness of breath, asthma ,  breath sounds clear to auscultation        Cardiovascular hypertension, + pacemaker + Cardiac Defibrillator Rhythm:Regular     Neuro/Psych  Headaches,    GI/Hepatic GERD-  ,  Endo/Other  Morbid obesity  Renal/GU      Musculoskeletal   Abdominal (+) + obese,   Peds  Hematology   Anesthesia Other Findings   Reproductive/Obstetrics                           Anesthesia Physical Anesthesia Plan  ASA: III  Anesthesia Plan: General   Post-op Pain Management:    Induction: Intravenous  Airway Management Planned: Oral ETT  Additional Equipment:   Intra-op Plan:   Post-operative Plan: Extubation in OR  Informed Consent: I have reviewed the patients History and Physical, chart, labs and discussed the procedure including the risks, benefits and alternatives for the proposed anesthesia with the patient or authorized representative who has indicated his/her understanding and acceptance.   Dental advisory given  Plan Discussed with: CRNA and Surgeon  Anesthesia Plan Comments:         Anesthesia Quick Evaluation

## 2013-11-27 NOTE — Anesthesia Procedure Notes (Signed)
Procedure Name: Intubation Date/Time: 11/27/2013 2:53 PM Performed by: Eligha Bridegroom Pre-anesthesia Checklist: Patient identified, Timeout performed, Emergency Drugs available, Suction available and Patient being monitored Patient Re-evaluated:Patient Re-evaluated prior to inductionOxygen Delivery Method: Circle system utilized Preoxygenation: Pre-oxygenation with 100% oxygen Intubation Type: IV induction Ventilation: Mask ventilation without difficulty and Oral airway inserted - appropriate to patient size Laryngoscope Size: Mac and 3 Grade View: Grade II Tube type: Oral Tube size: 7.0 mm Number of attempts: 1 Airway Equipment and Method: Stylet Secured at: 21 cm Tube secured with: Tape Dental Injury: Teeth and Oropharynx as per pre-operative assessment

## 2013-11-27 NOTE — H&P (Signed)
PREOPERATIVE H&P  Chief Complaint: back pain HPI: Paula Booker is a 68 y.o. female who presents with ongoing pain in the low back  MRI reveals acute compression fractures at T9 and T11  Patient has failed multiple forms of conservative care and continues to have pain (see office notes for additional details regarding the patient's full course of treatment)  Past Medical History  Diagnosis Date  . Asthma   . GERD (gastroesophageal reflux disease)   . Hypertension   . ICD (implantable cardiac defibrillator) in place Dec 2013  . Shortness of breath     "@ any time I can get SOB" (03/22/2012)  . Migraines   . Arthritis     "hands and knees" (03/22/2012)  . Skin cancer 1999  . WPW (Wolff-Parkinson-White syndrome)   . Automatic implantable cardioverter-defibrillator in situ   . Pacemaker   . CHF (congestive heart failure) 6/13  . Pneumonia     hx  . Anxiety    Past Surgical History  Procedure Laterality Date  . Cardiac defibrillator placement  03/22/2012    DDD  . Cholecystectomy  ~ 2003  . Tonsillectomy and adenoidectomy  1950  . Skin cancer excision  1999    "top of my head and my right back shoulder"  . Mohs surgery  06/2011    "forehead" (03/22/2012)  . Supraventricular tachycardia ablation  03/22/2012    unable to induce VT/RN report 03/22/2012  . Tubal ligation  1978   History   Social History  . Marital Status: Single    Spouse Name: N/A    Number of Children: 3  . Years of Education: N/A   Occupational History  . retired     Optometrist   Social History Main Topics  . Smoking status: Never Smoker   . Smokeless tobacco: Never Used  . Alcohol Use: No  . Drug Use: No  . Sexual Activity: Not on file   Other Topics Concern  . Not on file   Social History Narrative  . No narrative on file   Family History  Problem Relation Age of Onset  . Tuberculosis Paternal Grandfather   . Congestive Heart Failure Brother   . Congestive Heart Failure  Mother    Allergies  Allergen Reactions  . Ivp Dye [Iodinated Diagnostic Agents] Anaphylaxis, Hives, Swelling and Other (See Comments)    Throat swell  . Penicillins Anaphylaxis  . Sulfonamide Derivatives Other (See Comments)    Migraines  . Onion Other (See Comments)    Raw onions cause migraines   Prior to Admission medications   Medication Sig Start Date End Date Taking? Authorizing Provider  albuterol (PROVENTIL HFA;VENTOLIN HFA) 108 (90 BASE) MCG/ACT inhaler Inhale 2 puffs into the lungs every 6 (six) hours as needed. For wheezing   Yes Historical Provider, MD  alendronate (FOSAMAX) 70 MG tablet Take 70 mg by mouth once a week. Take with a full glass of water on an empty stomach.   Yes Historical Provider, MD  carvedilol (COREG) 12.5 MG tablet Take 1 tablet (12.5 mg total) by mouth 2 (two) times daily with a meal. 09/16/13  Yes Jettie Booze, MD  cetirizine (ZYRTEC) 10 MG tablet Take 10 mg by mouth daily.   Yes Historical Provider, MD  Cholecalciferol (VITAMIN D3) 1000 UNITS CAPS Take 2,000 Units by mouth daily.    Yes Historical Provider, MD  citalopram (CELEXA) 20 MG tablet Take 20 mg by mouth daily.   Yes Historical Provider,  MD  docusate sodium (COLACE) 100 MG capsule Take 1 capsule (100 mg total) by mouth every 12 (twelve) hours. 11/08/13  Yes Dorie Rank, MD  furosemide (LASIX) 40 MG tablet Take 1 tablet (40 mg total) by mouth daily. 05/21/13  Yes Jettie Booze, MD  gelatin 650 MG capsule Take 1,300 mg by mouth daily.   Yes Historical Provider, MD  HYDROcodone-acetaminophen (NORCO/VICODIN) 5-325 MG per tablet Take 1 tablet by mouth every 6 (six) hours as needed. For migraines 10/15/13  Yes Historical Provider, MD  losartan (COZAAR) 50 MG tablet Take 1 tablet (50 mg total) by mouth daily. 09/16/13  Yes Jettie Booze, MD  Misc Natural Products (OSTEO BI-FLEX ADV DOUBLE ST PO) Take 2 tablets by mouth every morning.    Yes Historical Provider, MD  Multiple Vitamins-Minerals  (MULTIVITAMIN PO) Take 1 tablet by mouth daily. ALIVE   Yes Historical Provider, MD  naproxen sodium (ALEVE) 220 MG tablet Take 440 mg by mouth 2 (two) times daily.    Yes Historical Provider, MD  omeprazole (PRILOSEC) 20 MG capsule Take 40 mg by mouth daily.   Yes Historical Provider, MD  oxyCODONE-acetaminophen (PERCOCET/ROXICET) 5-325 MG per tablet Take 1-2 tablets by mouth every 6 (six) hours as needed. 11/08/13  Yes Dorie Rank, MD  potassium chloride (K-DUR,KLOR-CON) 10 MEQ tablet Take 10 mEq by mouth 2 (two) times daily.   Yes Historical Provider, MD     All other systems have been reviewed and were otherwise negative with the exception of those mentioned in the HPI and as above.  Physical Exam: There were no vitals filed for this visit.  General: Alert, no acute distress Cardiovascular: No pedal edema Respiratory: No cyanosis, no use of accessory musculature Skin: No lesions in the area of chief complaint Neurologic: Sensation intact distally Psychiatric: Patient is competent for consent with normal mood and affect Lymphatic: No axillary or cervical lymphadenopathy  MUSCULOSKELETAL: + TTP low back  Assessment/Plan: T9, T11 compression fracture Plan for Procedure(s): KYPHOPLASTY   Sinclair Ship, MD 11/27/2013 7:54 AM

## 2013-11-28 ENCOUNTER — Encounter (HOSPITAL_COMMUNITY): Payer: Self-pay | Admitting: Orthopedic Surgery

## 2013-11-28 NOTE — Anesthesia Postprocedure Evaluation (Signed)
  Anesthesia Post-op Note  Patient: Paula Booker  Procedure(s) Performed: Procedure(s) with comments: KYPHOPLASTY (N/A) - T9, T11 kyphoplasty  Patient Location: PACU  Anesthesia Type:General  Level of Consciousness: awake and alert   Airway and Oxygen Therapy: Patient Spontanous Breathing  Post-op Pain: mild  Post-op Assessment: Post-op Vital signs reviewed  Post-op Vital Signs: stable  Last Vitals:  Filed Vitals:   11/27/13 1806  BP:   Pulse: 64  Temp: 36.7 C  Resp:     Complications: No apparent anesthesia complications

## 2013-12-11 ENCOUNTER — Other Ambulatory Visit: Payer: Self-pay

## 2013-12-11 MED ORDER — FUROSEMIDE 40 MG PO TABS: 40.0000 mg | ORAL_TABLET | Freq: Every day | ORAL | Status: DC

## 2014-01-15 ENCOUNTER — Ambulatory Visit (INDEPENDENT_AMBULATORY_CARE_PROVIDER_SITE_OTHER): Payer: Medicare Other | Admitting: *Deleted

## 2014-01-15 ENCOUNTER — Telehealth: Payer: Self-pay | Admitting: Cardiology

## 2014-01-15 ENCOUNTER — Encounter: Payer: Self-pay | Admitting: Internal Medicine

## 2014-01-15 DIAGNOSIS — I428 Other cardiomyopathies: Secondary | ICD-10-CM

## 2014-01-15 DIAGNOSIS — I5022 Chronic systolic (congestive) heart failure: Secondary | ICD-10-CM

## 2014-01-15 DIAGNOSIS — I429 Cardiomyopathy, unspecified: Secondary | ICD-10-CM

## 2014-01-15 LAB — MDC_IDC_ENUM_SESS_TYPE_REMOTE
Brady Statistic AP VS Percent: 4.86 %
Brady Statistic AS VS Percent: 95.11 %
Brady Statistic RA Percent Paced: 4.87 %
Date Time Interrogation Session: 20151007205132
HIGH POWER IMPEDANCE MEASURED VALUE: 285 Ohm
HighPow Impedance: 82 Ohm
Lead Channel Impedance Value: 513 Ohm
Lead Channel Pacing Threshold Amplitude: 0.5 V
Lead Channel Pacing Threshold Pulse Width: 0.4 ms
Lead Channel Pacing Threshold Pulse Width: 0.4 ms
Lead Channel Sensing Intrinsic Amplitude: 2.5 mV
Lead Channel Sensing Intrinsic Amplitude: 9.125 mV
Lead Channel Setting Pacing Amplitude: 2 V
MDC IDC MSMT BATTERY REMAINING LONGEVITY: 117 mo
MDC IDC MSMT BATTERY VOLTAGE: 3.01 V
MDC IDC MSMT LEADCHNL RA PACING THRESHOLD AMPLITUDE: 0.75 V
MDC IDC MSMT LEADCHNL RA SENSING INTR AMPL: 2.5 mV
MDC IDC MSMT LEADCHNL RV IMPEDANCE VALUE: 779 Ohm
MDC IDC MSMT LEADCHNL RV SENSING INTR AMPL: 9.125 mV
MDC IDC SET LEADCHNL RV PACING AMPLITUDE: 2.5 V
MDC IDC SET LEADCHNL RV PACING PULSEWIDTH: 0.4 ms
MDC IDC SET LEADCHNL RV SENSING SENSITIVITY: 0.3 mV
MDC IDC SET ZONE DETECTION INTERVAL: 350 ms
MDC IDC SET ZONE DETECTION INTERVAL: 400 ms
MDC IDC STAT BRADY AP VP PERCENT: 0.01 %
MDC IDC STAT BRADY AS VP PERCENT: 0.03 %
MDC IDC STAT BRADY RV PERCENT PACED: 0.03 %
Zone Setting Detection Interval: 270 ms
Zone Setting Detection Interval: 300 ms

## 2014-01-15 NOTE — Telephone Encounter (Signed)
LMOVM reminding pt to send remote transmission.   

## 2014-01-16 ENCOUNTER — Other Ambulatory Visit: Payer: Self-pay | Admitting: Internal Medicine

## 2014-01-16 NOTE — Progress Notes (Signed)
Remote ICD transmission.   

## 2014-01-27 ENCOUNTER — Encounter (HOSPITAL_COMMUNITY): Payer: Self-pay | Admitting: Emergency Medicine

## 2014-01-27 ENCOUNTER — Emergency Department (HOSPITAL_COMMUNITY): Payer: Medicare Other

## 2014-01-27 ENCOUNTER — Emergency Department (HOSPITAL_COMMUNITY)
Admission: EM | Admit: 2014-01-27 | Discharge: 2014-01-27 | Disposition: A | Discharge status: Home / Self Care | Payer: Medicare Other | Attending: Emergency Medicine | Admitting: Emergency Medicine

## 2014-01-27 DIAGNOSIS — M546 Pain in thoracic spine: Secondary | ICD-10-CM

## 2014-01-27 DIAGNOSIS — Z7983 Long term (current) use of bisphosphonates: Secondary | ICD-10-CM | POA: Insufficient documentation

## 2014-01-27 DIAGNOSIS — Z880 Allergy status to penicillin: Secondary | ICD-10-CM | POA: Insufficient documentation

## 2014-01-27 DIAGNOSIS — I456 Pre-excitation syndrome: Secondary | ICD-10-CM | POA: Diagnosis present

## 2014-01-27 DIAGNOSIS — Z85828 Personal history of other malignant neoplasm of skin: Secondary | ICD-10-CM | POA: Diagnosis not present

## 2014-01-27 DIAGNOSIS — M549 Dorsalgia, unspecified: Secondary | ICD-10-CM

## 2014-01-27 DIAGNOSIS — Z79899 Other long term (current) drug therapy: Secondary | ICD-10-CM | POA: Diagnosis not present

## 2014-01-27 DIAGNOSIS — I10 Essential (primary) hypertension: Secondary | ICD-10-CM | POA: Insufficient documentation

## 2014-01-27 DIAGNOSIS — R079 Chest pain, unspecified: Secondary | ICD-10-CM | POA: Diagnosis present

## 2014-01-27 DIAGNOSIS — Z8701 Personal history of pneumonia (recurrent): Secondary | ICD-10-CM | POA: Insufficient documentation

## 2014-01-27 DIAGNOSIS — Z9889 Other specified postprocedural states: Secondary | ICD-10-CM | POA: Insufficient documentation

## 2014-01-27 DIAGNOSIS — M1990 Unspecified osteoarthritis, unspecified site: Secondary | ICD-10-CM | POA: Diagnosis not present

## 2014-01-27 DIAGNOSIS — E669 Obesity, unspecified: Secondary | ICD-10-CM | POA: Diagnosis present

## 2014-01-27 DIAGNOSIS — Z95810 Presence of automatic (implantable) cardiac defibrillator: Secondary | ICD-10-CM | POA: Diagnosis not present

## 2014-01-27 DIAGNOSIS — G43909 Migraine, unspecified, not intractable, without status migrainosus: Secondary | ICD-10-CM | POA: Diagnosis not present

## 2014-01-27 DIAGNOSIS — J45909 Unspecified asthma, uncomplicated: Secondary | ICD-10-CM | POA: Diagnosis not present

## 2014-01-27 DIAGNOSIS — Z0389 Encounter for observation for other suspected diseases and conditions ruled out: Secondary | ICD-10-CM

## 2014-01-27 DIAGNOSIS — K219 Gastro-esophageal reflux disease without esophagitis: Secondary | ICD-10-CM | POA: Insufficient documentation

## 2014-01-27 DIAGNOSIS — I509 Heart failure, unspecified: Secondary | ICD-10-CM | POA: Diagnosis not present

## 2014-01-27 DIAGNOSIS — I429 Cardiomyopathy, unspecified: Secondary | ICD-10-CM

## 2014-01-27 DIAGNOSIS — I428 Other cardiomyopathies: Secondary | ICD-10-CM

## 2014-01-27 DIAGNOSIS — IMO0001 Reserved for inherently not codable concepts without codable children: Secondary | ICD-10-CM

## 2014-01-27 HISTORY — DX: Other cardiomyopathies: I42.8

## 2014-01-27 HISTORY — DX: Obesity, unspecified: E66.9

## 2014-01-27 LAB — COMPREHENSIVE METABOLIC PANEL
ALT: 15 U/L (ref 0–35)
ANION GAP: 10 (ref 5–15)
AST: 16 U/L (ref 0–37)
Albumin: 3.7 g/dL (ref 3.5–5.2)
Alkaline Phosphatase: 90 U/L (ref 39–117)
BUN: 18 mg/dL (ref 6–23)
CO2: 30 mEq/L (ref 19–32)
CREATININE: 0.85 mg/dL (ref 0.50–1.10)
Calcium: 9.6 mg/dL (ref 8.4–10.5)
Chloride: 100 mEq/L (ref 96–112)
GFR, EST AFRICAN AMERICAN: 80 mL/min — AB (ref >90)
GFR, EST NON AFRICAN AMERICAN: 69 mL/min — AB (ref >90)
GLUCOSE: 100 mg/dL — AB (ref 70–99)
Potassium: 4.5 mEq/L (ref 3.7–5.3)
Sodium: 140 mEq/L (ref 137–147)
TOTAL PROTEIN: 7.4 g/dL (ref 6.0–8.3)
Total Bilirubin: <0.2 mg/dL — ABNORMAL LOW (ref 0.3–1.2)

## 2014-01-27 LAB — URINALYSIS, ROUTINE W REFLEX MICROSCOPIC
BILIRUBIN URINE: NEGATIVE
Glucose, UA: NEGATIVE mg/dL
Ketones, ur: NEGATIVE mg/dL
Leukocytes, UA: NEGATIVE
Nitrite: NEGATIVE
PH: 6 (ref 5.0–8.0)
PROTEIN: NEGATIVE mg/dL
Specific Gravity, Urine: 1.004 — ABNORMAL LOW (ref 1.005–1.030)
Urobilinogen, UA: 0.2 mg/dL (ref 0.0–1.0)

## 2014-01-27 LAB — CBC
HEMATOCRIT: 39.2 % (ref 36.0–46.0)
HEMOGLOBIN: 13.2 g/dL (ref 12.0–15.0)
MCH: 30.7 pg (ref 26.0–34.0)
MCHC: 33.7 g/dL (ref 30.0–36.0)
MCV: 91.2 fL (ref 78.0–100.0)
Platelets: 319 10*3/uL (ref 150–400)
RBC: 4.3 MIL/uL (ref 3.87–5.11)
RDW: 12.9 % (ref 11.5–15.5)
WBC: 13.6 10*3/uL — ABNORMAL HIGH (ref 4.0–10.5)

## 2014-01-27 LAB — URINE MICROSCOPIC-ADD ON

## 2014-01-27 LAB — PROTIME-INR
INR: 1.13 (ref 0.00–1.49)
Prothrombin Time: 14.6 seconds (ref 11.6–15.2)

## 2014-01-27 LAB — APTT: aPTT: 27 seconds (ref 24–37)

## 2014-01-27 LAB — TROPONIN I

## 2014-01-27 MED ORDER — SODIUM CHLORIDE 0.9 % IV SOLN: 20.0000 mL | INTRAVENOUS | Status: DC

## 2014-01-27 MED ORDER — METHYLPREDNISOLONE SODIUM SUCC 125 MG IJ SOLR
125.0000 mg | Freq: Once | INTRAMUSCULAR | Status: AC
  Administered 2014-01-27: 125 mg via INTRAVENOUS
  Filled 2014-01-27: qty 2

## 2014-01-27 MED ORDER — DIPHENHYDRAMINE HCL 50 MG/ML IJ SOLN
50.0000 mg | Freq: Once | INTRAMUSCULAR | Status: AC
  Administered 2014-01-27: 50 mg via INTRAVENOUS
  Filled 2014-01-27: qty 1

## 2014-01-27 MED ORDER — IOHEXOL 350 MG/ML SOLN
100.0000 mL | Freq: Once | INTRAVENOUS | Status: AC | PRN
  Administered 2014-01-27: 100 mL via INTRAVENOUS

## 2014-01-27 MED ORDER — ASPIRIN 81 MG PO CHEW: 324.0000 mg | CHEWABLE_TABLET | Freq: Once | ORAL | Status: AC

## 2014-01-27 NOTE — ED Provider Notes (Signed)
  Physical Exam  BP 129/57  Pulse 59  Temp(Src) 98 F (36.7 C) (Oral)  Resp 17  SpO2 95%  Physical Exam  ED Course  Procedures  MDM CT does not show dissection or clear cause of the pain. Seen by cardiology and cleared for discharge home. Will discharge      Paula Booker. Alvino Chapel, MD 01/27/14 1704

## 2014-01-27 NOTE — ED Notes (Signed)
Cardiology at bedside.

## 2014-01-27 NOTE — Consult Note (Signed)
Reason for Consult:   Chest pain  Requesting Physician: ER  HPI: This is a 68 y.o. female with a past medical history significant for NICM. She had a cath June 2013 that showed no significant CAD and an EF of 20%. She underwent placement of a MDT duel chamber ICD as well as a RFA for WPW Dec  2013. She has done well since from a cardiac standpoint. Her last EF was 25% by echo June 2015. She presented to the ER today after she had sudden, disabling, mid sternal chest pain while walking down the stairs. There was some radiation to her back and a pleuritic component.  The pt though her defibrillator may have fired but this apparently was not the case after it was interrogated. She denies any CHF symptoms- no unusual dyspnea. She did have kyphoplasty in Aug this year after a fall in July. She tell me the surgery helped and she has been doing well from this standpoint.   PMHx:  Past Medical History  Diagnosis Date  . Asthma   . GERD (gastroesophageal reflux disease)   . Hypertension   . ICD (implantable cardiac defibrillator) in place Dec 2013  . Shortness of breath     "@ any time I can get SOB" (03/22/2012)  . Migraines   . Arthritis     "hands and knees" (03/22/2012)  . Skin cancer 1999  . WPW (Wolff-Parkinson-White syndrome)   . Pacemaker   . CHF (congestive heart failure) 6/13  . Pneumonia     hx  . Anxiety   . NICM (nonischemic cardiomyopathy)   . Obesity (BMI 30-39.9)     negative sleep study 2014    Past Surgical History  Procedure Laterality Date  . Cardiac defibrillator placement  03/22/2012    DDD  . Cholecystectomy  ~ 2003  . Tonsillectomy and adenoidectomy  1950  . Skin cancer excision  1999    "top of my head and my right back shoulder"  . Mohs surgery  06/2011    "forehead" (03/22/2012)  . Supraventricular tachycardia ablation  03/22/2012    unable to induce VT/RN report 03/22/2012  . Tubal ligation  1978  . Kyphoplasty N/A 11/27/2013   Procedure: KYPHOPLASTY;  Surgeon: Sinclair Ship, MD;  Location: [facility];  Service: Orthopedics;  Laterality: N/A;  T9, T11 kyphoplasty  . Cardiac catheterization  09/18/11    no significant CAD    SOCHx:  reports that she has never smoked. She has never used smokeless tobacco. She reports that she does not drink alcohol or use illicit drugs.She is divorced, 3 children, 9 grandchildren  FAMHx: Family History  Problem Relation Age of Onset  . Tuberculosis Paternal Grandfather   . Congestive Heart Failure Brother   . Congestive Heart Failure Mother     died 4    ALLERGIES: Allergies  Allergen Reactions  . Ivp Dye [Iodinated Diagnostic Agents] Anaphylaxis, Hives, Swelling and Other (See Comments)    Throat swell  . Penicillins Anaphylaxis  . Sulfonamide Derivatives Other (See Comments)    Migraines  . Onion Other (See Comments)    Raw onions cause migraines    ROS: Pertinent items are noted in HPI. she denies any orthopnea, PND, hemoptysis, PUD, melena,palpitations or unusual tachycardia.  She had negative sleep study in 2014.  HOME MEDICATIONS: Prior to Admission medications   Medication Sig Start Date End Date Taking? Authorizing Provider  albuterol (PROVENTIL HFA;VENTOLIN HFA) 108 (90  BASE) MCG/ACT inhaler Inhale 2 puffs into the lungs every 6 (six) hours as needed. For wheezing   Yes Historical Provider, MD  alendronate (FOSAMAX) 70 MG tablet Take 70 mg by mouth once a week. Take with a full glass of water on an empty stomach.   Yes Historical Provider, MD  carvedilol (COREG) 12.5 MG tablet Take 1 tablet (12.5 mg total) by mouth 2 (two) times daily with a meal. 09/16/13  Yes Jettie Booze, MD  cetirizine (ZYRTEC) 10 MG tablet Take 10 mg by mouth daily.   Yes Historical Provider, MD  Cholecalciferol (VITAMIN D3) 1000 UNITS CAPS Take 1,000 Units by mouth daily.    Yes Historical Provider, MD  citalopram (CELEXA) 20 MG tablet Take 20 mg by mouth daily.   Yes Historical  Provider, MD  doxycycline (VIBRA-TABS) 100 MG tablet Take 1 tablet by mouth 2 (two) times daily.  01/22/14  Yes Historical Provider, MD  fluticasone (FLONASE) 50 MCG/ACT nasal spray Place 1 spray into both nostrils daily. 01/22/14  Yes Historical Provider, MD  furosemide (LASIX) 40 MG tablet Take 1 tablet (40 mg total) by mouth daily. 12/11/13  Yes Jettie Booze, MD  gelatin 650 MG capsule Take 1,300 mg by mouth daily.   Yes Historical Provider, MD  losartan (COZAAR) 50 MG tablet Take 1 tablet (50 mg total) by mouth daily. 09/16/13  Yes Jettie Booze, MD  Misc Natural Products (OSTEO BI-FLEX ADV DOUBLE ST PO) Take 2 tablets by mouth every morning.    Yes Historical Provider, MD  Multiple Vitamins-Minerals (MULTIVITAMIN PO) Take 1 tablet by mouth daily. ALIVE   Yes Historical Provider, MD  naproxen sodium (ANAPROX) 220 MG tablet Take 440 mg by mouth 2 (two) times daily with a meal.   Yes Historical Provider, MD  omeprazole (PRILOSEC) 20 MG capsule Take 40 mg by mouth daily.   Yes Historical Provider, MD  potassium chloride (K-DUR,KLOR-CON) 10 MEQ tablet Take 10 mEq by mouth 2 (two) times daily.   Yes Historical Provider, MD    HOSPITAL MEDICATIONS: I have reviewed the patient's current medications.  VITALS: Blood pressure 129/57, pulse 59, temperature 98 F (36.7 C), temperature source Oral, resp. rate 17, SpO2 95.00%.  PHYSICAL EXAM: General appearance: alert, cooperative, no distress and moderately obese Neck: no carotid bruit and no JVD Lungs: clear to auscultation bilaterally and tender mid sternum Heart: regular rate and rhythm Abdomen: obese, non tender Extremities: extremities normal, atraumatic, no cyanosis or edema Pulses: 2+ and symmetric Skin: Skin color, texture, turgor normal. No rashes or lesions Neurologic: Grossly normal  LABS: Results for orders placed during the hospital encounter of 01/27/14 (from the past 24 hour(s))  APTT     Status: None   Collection  Time    01/27/14  1:38 PM      Result Value Ref Range   aPTT 27  24 - 37 seconds  CBC     Status: Abnormal   Collection Time    01/27/14  1:38 PM      Result Value Ref Range   WBC 13.6 (*) 4.0 - 10.5 K/uL   RBC 4.30  3.87 - 5.11 MIL/uL   Hemoglobin 13.2  12.0 - 15.0 g/dL   HCT 39.2  36.0 - 46.0 %   MCV 91.2  78.0 - 100.0 fL   MCH 30.7  26.0 - 34.0 pg   MCHC 33.7  30.0 - 36.0 g/dL   RDW 12.9  11.5 - 15.5 %  Platelets 319  150 - 400 K/uL  COMPREHENSIVE METABOLIC PANEL     Status: Abnormal   Collection Time    01/27/14  1:38 PM      Result Value Ref Range   Sodium 140  137 - 147 mEq/L   Potassium 4.5  3.7 - 5.3 mEq/L   Chloride 100  96 - 112 mEq/L   CO2 30  19 - 32 mEq/L   Glucose, Bld 100 (*) 70 - 99 mg/dL   BUN 18  6 - 23 mg/dL   Creatinine, Ser 0.85  0.50 - 1.10 mg/dL   Calcium 9.6  8.4 - 10.5 mg/dL   Total Protein 7.4  6.0 - 8.3 g/dL   Albumin 3.7  3.5 - 5.2 g/dL   AST 16  0 - 37 U/L   ALT 15  0 - 35 U/L   Alkaline Phosphatase 90  39 - 117 U/L   Total Bilirubin <0.2 (*) 0.3 - 1.2 mg/dL   GFR calc non Af Amer 69 (*) >90 mL/min   GFR calc Af Amer 80 (*) >90 mL/min   Anion gap 10  5 - 15  PROTIME-INR     Status: None   Collection Time    01/27/14  1:38 PM      Result Value Ref Range   Prothrombin Time 14.6  11.6 - 15.2 seconds   INR 1.13  0.00 - 1.49    EKG: NSR, LBBB- no change from April 2015  IMAGING: Dg Chest Portable 1 View  01/27/2014   CLINICAL DATA:  Pacemaker complication. The pacemaker fired well the patient was walking downstairs. Stabbing back pain.  EXAM: PORTABLE CHEST - 1 VIEW  COMPARISON:  Two-view chest x-ray 11/26/2013.  FINDINGS: The heart size is normal. A dual chamber AICD is in place. The wires are stable. Lungs are clear. The visualized soft tissues and bony thorax are unremarkable.  IMPRESSION: 1. Stable appearance of dual chamber pacemaker. 2. Low lung volumes. 3. No acute cardiopulmonary disease.   Electronically Signed   By: Lawrence Santiago  M.D.   On: 01/27/2014 13:53    IMPRESSION: Principal Problem:   Chest pain Active Problems:   NICM (nonischemic cardiomyopathy)   ICD- MDT implanted Dec 2013   Type B WPW syndrome- RFA Dec 2013   Obesity (BMI 30-39.9)   Normal coronary arteries   RECOMMENDATION: MD to review, Troponin pending, CTA chest to be ordered by ER MD, if negative no further cardiac work up. She has NSAIDs and pain medication at home. She has appointment for Dr Lovena Le and Dr Irish Lack scheduled for Dec.   Time Spent Directly with Patient: 40 minutes  Erlene Quan [phone] beeper 01/27/2014, 3:49 PM  Patient seen and examined and history reviewed. Agree with above findings and plan. 68 yo WF presents to the ED with sudden onset of acute severe knife like pain in her mid back radiating to her left precordium. Pain sharp and brought her to her knees. No SOB. Severe pain lasted about 10 minutes and largely relieved with ASA. No history of CAD. Cath in 2013 showed no sig. CAD. CHF well compensated. ICD interrogation showed no events. Ecg shows chronic LBBB. On exam she does have tenderness to palpation in the left upper precordium. I think she is low risk for ACS. Will check troponin. CT of the chest ordered. If no significant pathology I think she could be DC home with anti-inflammatory/analgesics.   Peter Martinique, [place] 01/27/2014 4:02 PM

## 2014-01-27 NOTE — ED Notes (Signed)
MD at bedside. 

## 2014-01-27 NOTE — Discharge Instructions (Signed)

## 2014-01-27 NOTE — ED Notes (Signed)
Per EMS: At 11 AM today pt was walking down stairs when she felt her [employer] pacemaker fired. Pt reports Hx of WPW, had pacemaker placed on Dec. 2013. Pt now reports 5/10 "stabbing" back pain that started after firing. Denies SOB, but reports initially with firing she felt SOB. Denies LOC. Pt now AO x4. Pt in NAD.

## 2014-01-27 NOTE — ED Provider Notes (Signed)
CSN: [id number]     Arrival date & time 01/27/14  1248 History   First MD Initiated Contact with Patient 01/27/14 1250     Chief Complaint  Patient presents with  . Pacemaker Problem     (Consider location/radiation/quality/duration/timing/severity/associated sxs/prior Treatment) HPI -year-old female who comes in today stating that she was in her usual state of good health when she was going down the steps and felt like her defibrillator went off which felt like a kick in the chest her. She states that she is caused her to double over and the pain lasted for approximately 5 minutes. After this pain had resolved she had pain in her left shoulder blade. The pain in left shoulder blade is a new type of pain. She describes it as sharp like a knife sticking in her. It has been continuous in nature. She denies any shortness of breath, fever or, nausea, or vomiting. She denies any history of aortic aneurysm, dissection, pulmonary embolism, or DVT. She denies history of coronary artery disease and states that she had a defibrillator placed for Wolff-Parkinson-White syndrome. She states she hasn't felt well off in the past and this is similar to previous episodes of her defibrillator firing. Past Medical History  Diagnosis Date  . Asthma   . GERD (gastroesophageal reflux disease)   . Hypertension   . ICD (implantable cardiac defibrillator) in place Dec 2013  . Shortness of breath     "@ any time I can get SOB" (03/22/2012)  . Migraines   . Arthritis     "hands and knees" (03/22/2012)  . Skin cancer 1999  . WPW (Wolff-Parkinson-White syndrome)   . Automatic implantable cardioverter-defibrillator in situ   . Pacemaker   . CHF (congestive heart failure) 6/13  . Pneumonia     hx  . Anxiety    Past Surgical History  Procedure Laterality Date  . Cardiac defibrillator placement  03/22/2012    DDD  . Cholecystectomy  ~ 2003  . Tonsillectomy and adenoidectomy  1950  . Skin cancer excision  1999     "top of my head and my right back shoulder"  . Mohs surgery  06/2011    "forehead" (03/22/2012)  . Supraventricular tachycardia ablation  03/22/2012    unable to induce VT/RN report 03/22/2012  . Tubal ligation  1978  . Kyphoplasty N/A 11/27/2013    Procedure: KYPHOPLASTY;  Surgeon: Sinclair Ship, MD;  Location: [facility];  Service: Orthopedics;  Laterality: N/A;  T9, T11 kyphoplasty   Family History  Problem Relation Age of Onset  . Tuberculosis Paternal Grandfather   . Congestive Heart Failure Brother   . Congestive Heart Failure Mother    History  Substance Use Topics  . Smoking status: Never Smoker   . Smokeless tobacco: Never Used  . Alcohol Use: No   OB History   Grav Para Term Preterm Abortions TAB SAB Ect Mult Living                 Review of Systems  All other systems reviewed and are negative.     Allergies  Ivp dye; Penicillins; Sulfonamide derivatives; and Onion  Home Medications   Prior to Admission medications   Medication Sig Start Date End Date Taking? Authorizing Provider  albuterol (PROVENTIL HFA;VENTOLIN HFA) 108 (90 BASE) MCG/ACT inhaler Inhale 2 puffs into the lungs every 6 (six) hours as needed. For wheezing    Historical Provider, MD  alendronate (FOSAMAX) 70 MG tablet Take 70 mg  by mouth once a week. Take with a full glass of water on an empty stomach.    Historical Provider, MD  carvedilol (COREG) 12.5 MG tablet Take 1 tablet (12.5 mg total) by mouth 2 (two) times daily with a meal. 09/16/13   Jettie Booze, MD  cetirizine (ZYRTEC) 10 MG tablet Take 10 mg by mouth daily.    Historical Provider, MD  Cholecalciferol (VITAMIN D3) 1000 UNITS CAPS Take 2,000 Units by mouth daily.     Historical Provider, MD  citalopram (CELEXA) 20 MG tablet Take 20 mg by mouth daily.    Historical Provider, MD  docusate sodium (COLACE) 100 MG capsule Take 1 capsule (100 mg total) by mouth every 12 (twelve) hours. 11/08/13   Dorie Rank, MD  furosemide (LASIX)  40 MG tablet Take 1 tablet (40 mg total) by mouth daily. 12/11/13   Jettie Booze, MD  gelatin 650 MG capsule Take 1,300 mg by mouth daily.    Historical Provider, MD  losartan (COZAAR) 50 MG tablet Take 1 tablet (50 mg total) by mouth daily. 09/16/13   Jettie Booze, MD  Misc Natural Products (OSTEO BI-FLEX ADV DOUBLE ST PO) Take 2 tablets by mouth every morning.     Historical Provider, MD  Multiple Vitamins-Minerals (MULTIVITAMIN PO) Take 1 tablet by mouth daily. ALIVE    Historical Provider, MD  omeprazole (PRILOSEC) 20 MG capsule Take 40 mg by mouth daily.    Historical Provider, MD  potassium chloride (K-DUR,KLOR-CON) 10 MEQ tablet Take 10 mEq by mouth 2 (two) times daily.    Historical Provider, MD   Pulse 69  Temp(Src) 98 F (36.7 C) (Oral)  Resp 18  SpO2 98% Physical Exam  Nursing note and vitals reviewed. Constitutional: She is oriented to person, place, and time. She appears well-developed and well-nourished.  HENT:  Head: Normocephalic and atraumatic.  Right Ear: External ear normal.  Left Ear: External ear normal.  Nose: Nose normal.  Mouth/Throat: Oropharynx is clear and moist.  Eyes: Conjunctivae and EOM are normal. Pupils are equal, round, and reactive to light.  Neck: Normal range of motion. Neck supple.  Cardiovascular: Normal rate and regular rhythm.   Pulmonary/Chest: Effort normal and breath sounds normal.  Abdominal: Soft. Bowel sounds are normal.  Musculoskeletal: Normal range of motion.  Neurological: She is alert and oriented to person, place, and time. She has normal reflexes.  Skin: Skin is warm and dry.  Psychiatric: She has a normal mood and affect. Her behavior is normal. Judgment and thought content normal.    ED Course  Procedures (including critical care time) Labs Review Labs Reviewed  CBC - Abnormal; Notable for the following:    WBC 13.6 (*)    All other components within normal limits  COMPREHENSIVE METABOLIC PANEL - Abnormal;  Notable for the following:    Glucose, Bld 100 (*)    Total Bilirubin <0.2 (*)    GFR calc non Af Amer 69 (*)    GFR calc Af Amer 80 (*)    All other components within normal limits  APTT  PROTIME-INR  URINALYSIS, ROUTINE W REFLEX MICROSCOPIC  TROPONIN I    Imaging Review Dg Chest Portable 1 View  01/27/2014   CLINICAL DATA:  Pacemaker complication. The pacemaker fired well the patient was walking downstairs. Stabbing back pain.  EXAM: PORTABLE CHEST - 1 VIEW  COMPARISON:  Two-view chest x-Cailynn Bodnar 11/26/2013.  FINDINGS: The heart size is normal. A dual chamber AICD is in place.  The wires are stable. Lungs are clear. The visualized soft tissues and bony thorax are unremarkable.  IMPRESSION: 1. Stable appearance of dual chamber pacemaker. 2. Low lung volumes. 3. No acute cardiopulmonary disease.   Electronically Signed   By: Lawrence Santiago M.D.   On: 01/27/2014 13:53     EKG Interpretation   Date/Time:  Monday January 27 2014 13:02:45 EDT Ventricular Rate:  60 PR Interval:  106 QRS Duration: 149 QT Interval:  518 QTC Calculation: 518 R Axis:   -34 Text Interpretation:  Normal sinus rhythm PR interval with short PR Left  bundle branch block Non-specific ST-t changes Confirmed by Chiyoko Torrico MD,  Andee Poles ([id number]) on 01/27/2014 1:29:05 PM      MDM   Final diagnoses:  Chest pain, unspecified chest pain type    Defibrillator report reveals no evidence of arrhythmia or defibrillation.  Awaiting troponin result.  Other labs normal except leukocytosis of unclear etiology.  Patient has resolution of symptoms after benadryl and solumedrol.  Patient with history of rash after iv contrast.  She is to have ct for dissection after protocol premedication time frame. Discussed with Kerin Ransom, PA saw patient for cardiology.  Dr. Alvino Chapel assumed care and will follow up ct, troponin, and cardiology consult.   Shaune Pollack, MD 01/28/14 [phone]

## 2014-01-31 ENCOUNTER — Encounter: Payer: Self-pay | Admitting: *Deleted

## 2014-02-23 ENCOUNTER — Other Ambulatory Visit: Payer: Self-pay | Admitting: Interventional Cardiology

## 2014-02-25 ENCOUNTER — Other Ambulatory Visit: Payer: Self-pay

## 2014-03-20 ENCOUNTER — Encounter (HOSPITAL_COMMUNITY): Payer: Self-pay | Admitting: Internal Medicine

## 2014-03-25 ENCOUNTER — Ambulatory Visit (INDEPENDENT_AMBULATORY_CARE_PROVIDER_SITE_OTHER): Payer: Medicare Other | Admitting: Internal Medicine

## 2014-03-25 ENCOUNTER — Encounter: Payer: Self-pay | Admitting: Internal Medicine

## 2014-03-25 VITALS — BP 114/70 | HR 75 | Ht 63.5 in | Wt 203.4 lb

## 2014-03-25 DIAGNOSIS — I5023 Acute on chronic systolic (congestive) heart failure: Secondary | ICD-10-CM | POA: Diagnosis not present

## 2014-03-25 DIAGNOSIS — Z95810 Presence of automatic (implantable) cardiac defibrillator: Secondary | ICD-10-CM

## 2014-03-25 DIAGNOSIS — I429 Cardiomyopathy, unspecified: Secondary | ICD-10-CM | POA: Diagnosis not present

## 2014-03-25 DIAGNOSIS — I428 Other cardiomyopathies: Secondary | ICD-10-CM

## 2014-03-25 DIAGNOSIS — E669 Obesity, unspecified: Secondary | ICD-10-CM | POA: Diagnosis not present

## 2014-03-25 LAB — MDC_IDC_ENUM_SESS_TYPE_INCLINIC
Battery Remaining Longevity: 115 mo
Battery Voltage: 3.01 V
Brady Statistic RA Percent Paced: 6.05 %
Date Time Interrogation Session: 20151215125956
HIGH POWER IMPEDANCE MEASURED VALUE: 247 Ohm
HighPow Impedance: 77 Ohm
Lead Channel Impedance Value: 532 Ohm
Lead Channel Pacing Threshold Amplitude: 0.625 V
Lead Channel Pacing Threshold Pulse Width: 0.4 ms
Lead Channel Pacing Threshold Pulse Width: 0.4 ms
Lead Channel Sensing Intrinsic Amplitude: 3.625 mV
Lead Channel Sensing Intrinsic Amplitude: 4.25 mV
Lead Channel Setting Pacing Amplitude: 2 V
Lead Channel Setting Pacing Amplitude: 2.5 V
Lead Channel Setting Sensing Sensitivity: 0.3 mV
MDC IDC MSMT LEADCHNL RV IMPEDANCE VALUE: 703 Ohm
MDC IDC MSMT LEADCHNL RV PACING THRESHOLD AMPLITUDE: 0.5 V
MDC IDC MSMT LEADCHNL RV SENSING INTR AMPL: 18.625 mV
MDC IDC MSMT LEADCHNL RV SENSING INTR AMPL: 8.125 mV
MDC IDC SET LEADCHNL RV PACING PULSEWIDTH: 0.4 ms
MDC IDC SET ZONE DETECTION INTERVAL: 300 ms
MDC IDC STAT BRADY AP VP PERCENT: 0.01 %
MDC IDC STAT BRADY AP VS PERCENT: 6.04 %
MDC IDC STAT BRADY AS VP PERCENT: 0.03 %
MDC IDC STAT BRADY AS VS PERCENT: 93.92 %
MDC IDC STAT BRADY RV PERCENT PACED: 0.04 %
Zone Setting Detection Interval: 270 ms
Zone Setting Detection Interval: 350 ms
Zone Setting Detection Interval: 400 ms

## 2014-03-25 NOTE — Patient Instructions (Signed)
Your physician wants you to follow-up in: 12 months with Dr. Taylor You will receive a reminder letter in the mail two months in advance. If you don't receive a letter, please call our office to schedule the follow-up appointment.    Remote monitoring is used to monitor your Pacemaker or ICD from home. This monitoring reduces the number of office visits required to check your device to one time per year. It allows us to keep an eye on the functioning of your device to ensure it is working properly. You are scheduled for a device check from home on 06/24/14. You may send your transmission at any time that day. If you have a wireless device, the transmission will be sent automatically. After your physician reviews your transmission, you will receive a postcard with your next transmission date.   

## 2014-03-25 NOTE — Assessment & Plan Note (Signed)
Her symptoms remain class II. She will continue her current medications and maintain a low-sodium diet.

## 2014-03-25 NOTE — Assessment & Plan Note (Signed)
She appears to have regained some of her weight. I've encouraged the patient to reduce her caloric intake, and try and increase her physical activity.

## 2014-03-25 NOTE — Assessment & Plan Note (Signed)
Her Medtronic dual-chamber ICD is working normally. We'll plan to recheck in several months. 

## 2014-03-25 NOTE — Progress Notes (Signed)
HPI Paula Booker returns today for followup. She is a pleasant 68 yo woman with a non-ischemic CM, syncope and a WPW pattern on her ECG who underwent EP study with no inducible arrhythmias followed by ICD implant 2 years ago. She has sustained a back fracture, and has become more sedentary. She denies any ICD shocks.  No other complaints. No peripheral edema. Allergies  Allergen Reactions  . Ivp Dye [Iodinated Diagnostic Agents] Anaphylaxis, Hives, Swelling and Other (See Comments)    Throat swell  . Penicillins Anaphylaxis  . Sulfonamide Derivatives Other (See Comments)    Migraines  . Onion Other (See Comments)    Raw onions cause migraines     Current Outpatient Prescriptions  Medication Sig Dispense Refill  . albuterol (PROVENTIL HFA;VENTOLIN HFA) 108 (90 BASE) MCG/ACT inhaler Inhale 2 puffs into the lungs every 6 (six) hours as needed. For wheezing    . alendronate (FOSAMAX) 70 MG tablet Take 70 mg by mouth once a week. Take with a full glass of water on an empty stomach.    . carvedilol (COREG) 12.5 MG tablet Take 1 tablet (12.5 mg total) by mouth 2 (two) times daily with a meal. 60 tablet 6  . cetirizine (ZYRTEC) 10 MG tablet Take 10 mg by mouth daily.    . Cholecalciferol (VITAMIN D3) 1000 UNITS CAPS Take 1,000 Units by mouth daily.     . citalopram (CELEXA) 20 MG tablet Take 20 mg by mouth daily.    Marland Kitchen doxycycline (VIBRA-TABS) 100 MG tablet Take 1 tablet by mouth 2 (two) times daily.     . fluticasone (FLONASE) 50 MCG/ACT nasal spray Place 1 spray into both nostrils daily.    . furosemide (LASIX) 40 MG tablet Take 1 tablet (40 mg total) by mouth daily. 30 tablet 6  . gelatin 650 MG capsule Take 1,300 mg by mouth daily.    Marland Kitchen losartan (COZAAR) 50 MG tablet Take 1 tablet (50 mg total) by mouth daily. 30 tablet 6  . Misc Natural Products (OSTEO BI-FLEX ADV DOUBLE ST PO) Take 2 tablets by mouth every morning.     . Multiple Vitamins-Minerals (MULTIVITAMIN PO) Take 1 tablet by  mouth daily. ALIVE    . naproxen sodium (ANAPROX) 220 MG tablet Take 440 mg by mouth 2 (two) times daily with a meal.    . omeprazole (PRILOSEC) 20 MG capsule Take 40 mg by mouth daily.    . potassium chloride (K-DUR,KLOR-CON) 10 MEQ tablet Take 10 mEq by mouth 2 (two) times daily.    . potassium chloride (MICRO-K) 10 MEQ CR capsule TAKE ONE CAPSULE BY MOUTH TWICE DAILY 60 capsule 0   No current facility-administered medications for this visit.     Past Medical History  Diagnosis Date  . Asthma   . GERD (gastroesophageal reflux disease)   . Hypertension   . ICD (implantable cardiac defibrillator) in place Dec 2013  . Shortness of breath     "@ any time I can get SOB" (03/22/2012)  . Migraines   . Arthritis     "hands and knees" (03/22/2012)  . Skin cancer 1999  . WPW (Wolff-Parkinson-White syndrome)   . Pacemaker   . CHF (congestive heart failure) 6/13  . Pneumonia     hx  . Anxiety   . NICM (nonischemic cardiomyopathy)   . Obesity (BMI 30-39.9)     negative sleep study 2014    ROS:   All systems reviewed and negative except as  noted in the HPI.   Past Surgical History  Procedure Laterality Date  . Cardiac defibrillator placement  03/22/2012    DDD  . Cholecystectomy  ~ 2003  . Tonsillectomy and adenoidectomy  1950  . Skin cancer excision  1999    "top of my head and my right back shoulder"  . Mohs surgery  06/2011    "forehead" (03/22/2012)  . Supraventricular tachycardia ablation  03/22/2012    unable to induce VT/RN report 03/22/2012  . Tubal ligation  1978  . Kyphoplasty N/A 11/27/2013    Procedure: KYPHOPLASTY;  Surgeon: Sinclair Ship, MD;  Location: [facility];  Service: Orthopedics;  Laterality: N/A;  T9, T11 kyphoplasty  . Cardiac catheterization  09/18/11    no significant CAD  . Supraventricular tachycardia ablation N/A 03/22/2012    Procedure: SUPRAVENTRICULAR TACHYCARDIA ABLATION;  Surgeon: Evans Lance, MD;  Location: [facility] CATH LAB;  Service:  Cardiovascular;  Laterality: N/A;  . Implantable cardioverter defibrillator implant N/A 03/22/2012    Procedure: IMPLANTABLE CARDIOVERTER DEFIBRILLATOR IMPLANT;  Surgeon: Evans Lance, MD;  Location[facility] C[facility] LAB;  Service: Cardiovascular;  Laterality: N/A;     Family History  Problem Relation Age of Onset  . Tuberculosis Paternal Grandfather   . Congestive Heart Failure Brother   . Congestive Heart Failure Mother     died 69     History   Social History  . Marital Status: Single    Spouse Name: N/A    Number of Children: 3  . Years of Education: N/A   Occupational History  . retired     Optometrist   Social History Main Topics  . Smoking status: Never Smoker   . Smokeless tobacco: Never Used  . Alcohol Use: No  . Drug Use: No  . Sexual Activity: Not on file   Other Topics Concern  . Not on file   Social History Narrative   Divorced, 3 children, 9 grandchildren     BP 114/70 mmHg  Pulse 75  Ht 5' 3.5" (1.613 m)  Wt 203 lb 6.4 oz (92.262 kg)  BMI 35.46 kg/m2  Physical Exam:  Stable appearing 68 yo woman, NAD HEENT: Unremarkable Neck:  No JVD, no thyromegally Back:  No CVA tenderness Lungs:  Clear with no wheezes HEART:  Regular rate rhythm, no murmurs, no rubs, no clicks Abd:  soft, positive bowel sounds, no organomegally, no rebound, no guarding Ext:  2 plus pulses, no edema, no cyanosis, no clubbing Skin:  No rashes no nodules Neuro:  CN II through XII intact, motor grossly intact   DEVICE  Normal device function.  See PaceArt for details.   Assess/Plan:

## 2014-03-27 ENCOUNTER — Encounter: Payer: Self-pay | Admitting: Interventional Cardiology

## 2014-03-27 ENCOUNTER — Ambulatory Visit (INDEPENDENT_AMBULATORY_CARE_PROVIDER_SITE_OTHER): Payer: Medicare Other | Admitting: Interventional Cardiology

## 2014-03-27 VITALS — BP 132/72 | HR 63 | Ht 63.5 in | Wt 205.4 lb

## 2014-03-27 DIAGNOSIS — I10 Essential (primary) hypertension: Secondary | ICD-10-CM | POA: Insufficient documentation

## 2014-03-27 DIAGNOSIS — R0602 Shortness of breath: Secondary | ICD-10-CM

## 2014-03-27 DIAGNOSIS — I429 Cardiomyopathy, unspecified: Secondary | ICD-10-CM

## 2014-03-27 DIAGNOSIS — R0789 Other chest pain: Secondary | ICD-10-CM

## 2014-03-27 DIAGNOSIS — I428 Other cardiomyopathies: Secondary | ICD-10-CM

## 2014-03-27 MED ORDER — POTASSIUM CHLORIDE ER 10 MEQ PO CPCR: 10.0000 meq | ORAL_CAPSULE | Freq: Two times a day (BID) | ORAL | Status: DC

## 2014-03-27 MED ORDER — CARVEDILOL 12.5 MG PO TABS: 12.5000 mg | ORAL_TABLET | Freq: Two times a day (BID) | ORAL | Status: DC

## 2014-03-27 MED ORDER — SPIRONOLACTONE 25 MG PO TABS: 25.0000 mg | ORAL_TABLET | Freq: Every day | ORAL | Status: DC

## 2014-03-27 NOTE — Patient Instructions (Signed)
Your physician has recommended you make the following change in your medication:  1) Start spironolactone 25 mg once daily  Your physician recommends that you return for lab work in: 1 week & 3 months- BMP  Your physician wants you to follow-up in: 6 months with Dr. Irish Lack. You will receive a reminder letter in the mail two months in advance. If you don't receive a letter, please call our office to schedule the follow-up appointment.

## 2014-03-27 NOTE — Progress Notes (Signed)
Patient ID: Paula Booker, female   DOB: 25-Oct-1945, 68 y.o.   MRN: [id number]     [facility] [address], [address] [place], [place]  [address] Phone: [phone] Fax:  [phone]  Date:  03/27/2014   ID:  Paula Booker, DOB 1945-07-05, MRN [id number]  PCP:  Antony Blackbird, MD      History of Present Illness: Paula Booker is a 68 y.o. female who has a nonischemic cardiomyopathy. She has had WPW diagnosed on ECG as well by Dr. Lovena Le.  She has had ICD implant and EP study for WPW with no inducible arrhythmia. Cardiomyopathy:   c/o Shortness of breath with exertion, improved Denies :   Chest pain .  Palpitations  Dizziness.  Leg edema.  Orthopnea.  Paroxysmal nocturnal dyspnea.  Syncope.   She had lost weight intentionally.  SHe was using the treadmill and watching her diet. She had lost 20 lbs.  Unfortunately, she gained some back.  She has lost and maintained.  She complains of easy fatiguability during her recent move. She feels that her stamina is below what she wants but is getting better.  A month ago, she was seen in the ER for chest discomfort. She had a negative troponin and negative CT scan. She was sent home. She has not had any further chest discomfort like on that day. She states that she was under a lot of stress at that time due to moving. She thinks it was a panic attack.   Wt Readings from Last 3 Encounters:  03/27/14 205 lb 6.4 oz (93.169 kg)  03/25/14 203 lb 6.4 oz (92.262 kg)  11/27/13 204 lb (92.534 kg)     Past Medical History  Diagnosis Date  . Asthma   . GERD (gastroesophageal reflux disease)   . Hypertension   . ICD (implantable cardiac defibrillator) in place Dec 2013  . Shortness of breath     "@ any time I can get SOB" (03/22/2012)  . Migraines   . Arthritis     "hands and knees" (03/22/2012)  . Skin cancer 1999  . WPW (Wolff-Parkinson-White syndrome)   . Pacemaker   . CHF (congestive heart failure) 6/13  . Pneumonia     hx  . Anxiety   .  NICM (nonischemic cardiomyopathy)   . Obesity (BMI 30-39.9)     negative sleep study 2014    Current Outpatient Prescriptions  Medication Sig Dispense Refill  . albuterol (PROVENTIL HFA;VENTOLIN HFA) 108 (90 BASE) MCG/ACT inhaler Inhale 2 puffs into the lungs every 6 (six) hours as needed. For wheezing    . alendronate (FOSAMAX) 70 MG tablet Take 70 mg by mouth once a week. Take with a full glass of water on an empty stomach.    . carvedilol (COREG) 12.5 MG tablet Take 1 tablet (12.5 mg total) by mouth 2 (two) times daily with a meal. 60 tablet 6  . cetirizine (ZYRTEC) 10 MG tablet Take 10 mg by mouth daily.    . Cholecalciferol (VITAMIN D3) 1000 UNITS CAPS Take 1,000 Units by mouth daily.     . citalopram (CELEXA) 20 MG tablet Take 20 mg by mouth daily.    Marland Kitchen doxycycline (VIBRA-TABS) 100 MG tablet Take 1 tablet by mouth 2 (two) times daily.     . fluticasone (FLONASE) 50 MCG/ACT nasal spray Place 1 spray into both nostrils daily.    . furosemide (LASIX) 40 MG tablet Take 1 tablet (40 mg total) by mouth daily. 30 tablet 6  .  gelatin 650 MG capsule Take 1,300 mg by mouth daily.    Marland Kitchen losartan (COZAAR) 50 MG tablet Take 1 tablet (50 mg total) by mouth daily. 30 tablet 6  . Misc Natural Products (OSTEO BI-FLEX ADV DOUBLE ST PO) Take 2 tablets by mouth every morning.     . Multiple Vitamins-Minerals (MULTIVITAMIN PO) Take 1 tablet by mouth daily. ALIVE    . naproxen sodium (ANAPROX) 220 MG tablet Take 440 mg by mouth 2 (two) times daily with a meal.    . omeprazole (PRILOSEC) 20 MG capsule Take 40 mg by mouth daily.    . potassium chloride (MICRO-K) 10 MEQ CR capsule TAKE ONE CAPSULE BY MOUTH TWICE DAILY 60 capsule 0   No current facility-administered medications for this visit.    Allergies:    Allergies  Allergen Reactions  . Ivp Dye [Iodinated Diagnostic Agents] Anaphylaxis, Hives, Swelling and Other (See Comments)    Throat swell  . Penicillins Anaphylaxis  . Sulfonamide Derivatives  Other (See Comments)    Migraines  . Onion Other (See Comments)    Raw onions cause migraines    Social History:  The patient  reports that she has never smoked. She has never used smokeless tobacco. She reports that she does not drink alcohol or use illicit drugs.   Family History:  The patient's family history includes Congestive Heart Failure in her brother and mother; Tuberculosis in her paternal grandfather.   ROS:  Please see the history of present illness.  No nausea, vomiting.  No fevers, chills.  No focal weakness.  No dysuria.    All other systems reviewed and negative.   PHYSICAL EXAM: VS:  BP 132/72 mmHg  Pulse 63  Ht 5' 3.5" (1.613 m)  Wt 205 lb 6.4 oz (93.169 kg)  BMI 35.81 kg/m2 Well nourished, well developed, in no acute distress HEENT: normal Neck: no JVD, no carotid bruits Cardiac:  normal S1, S2; RRR;  Lungs:  clear to auscultation bilaterally, no wheezing, rhonchi or rales Abd: soft, nontender, no hepatomegaly Ext: no edema Skin: warm and dry Neuro:   no focal abnormalities noted Psych: anxious affect   NSR, LBBB, delta wave   ASSESSMENT AND PLAN:  Cardiomyopathy /fatigue Notes: Nonischemic cardiomyopathy with defibrillator placed. Her defibrillator has not fired. She clinically does not appear to be in congestive heart failure.  EF 25% in 6/15.  Start aldactone 25 mg daily.  Check BMet in one week.  D/c potassium supplement. 2. Abnormal EKG  Notes: evidence of Wolff-Parkinson-White on her prior ECG. no evidence of tachycardia on exam. Now has AICD in place to check if she has had arrhythmia.  3. HTN: BP at home is in the 754/36 range systolic.   Watch for hypotension with addition of aldactone. 4. SHOB: She states she had a negative sleep study in 2014.   5. Recent chest pain: resolved. Negative w/u in ER.  She thinks this was a panic attack.    Signed, Mina Marble, MD, [facility] 03/27/2014 10:48 AM

## 2014-03-27 NOTE — Addendum Note (Signed)
Addended byAlvis Lemmings C on: 03/27/2014 11:55 AM   Modules accepted: Orders

## 2014-03-31 ENCOUNTER — Telehealth: Payer: Self-pay | Admitting: Interventional Cardiology

## 2014-03-31 NOTE — Telephone Encounter (Signed)
New message      Dr Irish Lack changed pts medication.  She is taking spironolactone and furosemide.  She is staying in the bathroom.  She is concerned because she is off of her potassium.  She is scared to take both medications.  Please call

## 2014-03-31 NOTE — Telephone Encounter (Signed)
I attempted to call the patient. No answer/ no voice mail set up. Will call back later.

## 2014-04-01 ENCOUNTER — Other Ambulatory Visit: Payer: Self-pay | Admitting: *Deleted

## 2014-04-01 NOTE — Telephone Encounter (Signed)
I spoke with the patient. I advised her that she may take spironolactone and lasix together. We will recheck her BMP this week to see what her potassium looks like. She is not taking oral potassium at home, but she does have this if needed.

## 2014-04-02 ENCOUNTER — Other Ambulatory Visit (INDEPENDENT_AMBULATORY_CARE_PROVIDER_SITE_OTHER): Payer: Medicare Other | Admitting: *Deleted

## 2014-04-02 DIAGNOSIS — I428 Other cardiomyopathies: Secondary | ICD-10-CM

## 2014-04-02 DIAGNOSIS — I429 Cardiomyopathy, unspecified: Secondary | ICD-10-CM

## 2014-04-02 LAB — BASIC METABOLIC PANEL
BUN: 16 mg/dL (ref 6–23)
CALCIUM: 9.1 mg/dL (ref 8.4–10.5)
CHLORIDE: 103 meq/L (ref 96–112)
CO2: 28 mEq/L (ref 19–32)
CREATININE: 0.7 mg/dL (ref 0.4–1.2)
GFR: 84.24 mL/min (ref >60.00)
Glucose, Bld: 130 mg/dL — ABNORMAL HIGH (ref 70–99)
Potassium: 4 mEq/L (ref 3.5–5.1)
Sodium: 136 mEq/L (ref 135–145)

## 2014-04-13 ENCOUNTER — Telehealth: Payer: Self-pay | Admitting: Physician Assistant

## 2014-04-13 NOTE — Telephone Encounter (Signed)
Pt with h/o cardiomyopathy and nonobstructive CAD and significant headache today with L side headache and R facial droop that had quick onset and quick resolution. Currently, R sided facial droop already resolved. Possible etiology include TIA vs migraine. She does have a h/o migraine. I have instructed her to seek medical attention at local ED and possible get a head CT to r/o underlying cerebral ischemia.   Hilbert Corrigan PA Pager: [phone]

## 2014-04-15 ENCOUNTER — Telehealth: Payer: Self-pay | Admitting: Internal Medicine

## 2014-04-15 ENCOUNTER — Ambulatory Visit (INDEPENDENT_AMBULATORY_CARE_PROVIDER_SITE_OTHER): Payer: Medicare Other | Admitting: *Deleted

## 2014-04-15 DIAGNOSIS — I429 Cardiomyopathy, unspecified: Secondary | ICD-10-CM

## 2014-04-15 DIAGNOSIS — I428 Other cardiomyopathies: Secondary | ICD-10-CM

## 2014-04-15 LAB — MDC_IDC_ENUM_SESS_TYPE_INCLINIC
Battery Remaining Longevity: 9.4
Brady Statistic AP VS Percent: 17.6 %
Brady Statistic AS VP Percent: 0.1 % — CL
Brady Statistic AS VS Percent: 82.4 %
HighPow Impedance: 90 Ohm
Lead Channel Impedance Value: 532 Ohm
Lead Channel Impedance Value: 760 Ohm
Lead Channel Pacing Threshold Amplitude: 0.5 V
Lead Channel Pacing Threshold Amplitude: 0.625 V
Lead Channel Pacing Threshold Pulse Width: 0.4 ms
Lead Channel Sensing Intrinsic Amplitude: 3.5 mV
Lead Channel Setting Pacing Amplitude: 2.5 V
MDC IDC MSMT LEADCHNL RV PACING THRESHOLD PULSEWIDTH: 0.4 ms
MDC IDC MSMT LEADCHNL RV SENSING INTR AMPL: 11.8 mV
MDC IDC SET LEADCHNL RA PACING AMPLITUDE: 2 V
MDC IDC SET LEADCHNL RV PACING PULSEWIDTH: 0.4 ms
MDC IDC SET LEADCHNL RV SENSING SENSITIVITY: 0.3 mV
MDC IDC SET ZONE DETECTION INTERVAL: 300 ms
MDC IDC STAT BRADY AP VP PERCENT: 0.1 % — AB
Zone Setting Detection Interval: 270 ms
Zone Setting Detection Interval: 350 ms
Zone Setting Detection Interval: 400 ms

## 2014-04-15 NOTE — Telephone Encounter (Signed)
Transmission was sent---RV defib lead impedance out of range 102. Alert range less than 20 or greater than 100. Pt was offered appointment for device clinic today but unable to come in. Pt was scheduled for 04-16-14 @ 900 to check lead impedance and possibly change alert range. Pt aware of appointment.

## 2014-04-15 NOTE — Progress Notes (Signed)
Patient presents to ofc for audible RV defib alert (N/C). RV defib impedance measurement reached 102ohms earlier today, currently 90ohms. Per GT--- Ok to increase RV defib impedance alert max from 100ohms to 130ohms. Patient to follow up as scheduled.

## 2014-04-15 NOTE — Telephone Encounter (Signed)
New message      1. Has your device fired? ICD did not shock her--  2. Is you device beeping? Box is beeping and pt is scared that something is wrong.  3. Are you experiencing draining or swelling at device site?no  4. Are you calling to see if we received your device transmission? no 5. Have you passed out? no

## 2014-04-21 ENCOUNTER — Encounter: Payer: Self-pay | Admitting: Internal Medicine

## 2014-05-07 ENCOUNTER — Encounter: Payer: Self-pay | Admitting: Internal Medicine

## 2014-05-21 ENCOUNTER — Other Ambulatory Visit: Payer: Self-pay | Admitting: Orthopedic Surgery

## 2014-05-21 DIAGNOSIS — M546 Pain in thoracic spine: Secondary | ICD-10-CM

## 2014-05-22 ENCOUNTER — Ambulatory Visit
Admission: RE | Admit: 2014-05-22 | Discharge: 2014-05-22 | Disposition: A | Discharge status: Home / Self Care | Payer: Medicare Other | Source: Ambulatory Visit | Attending: Orthopedic Surgery | Admitting: Orthopedic Surgery

## 2014-05-22 DIAGNOSIS — M546 Pain in thoracic spine: Secondary | ICD-10-CM

## 2014-05-26 ENCOUNTER — Other Ambulatory Visit: Payer: Medicare Other

## 2014-05-27 ENCOUNTER — Other Ambulatory Visit: Payer: Self-pay | Admitting: Orthopedic Surgery

## 2014-06-03 ENCOUNTER — Encounter (HOSPITAL_COMMUNITY): Payer: Self-pay

## 2014-06-03 ENCOUNTER — Encounter (HOSPITAL_COMMUNITY): Payer: Self-pay | Admitting: *Deleted

## 2014-06-03 ENCOUNTER — Encounter (HOSPITAL_COMMUNITY)
Admission: RE | Admit: 2014-06-03 | Discharge: 2014-06-03 | Disposition: A | Discharge status: Home / Self Care | Payer: Medicare Other | Source: Ambulatory Visit | Attending: Orthopedic Surgery | Admitting: Orthopedic Surgery

## 2014-06-03 DIAGNOSIS — I509 Heart failure, unspecified: Secondary | ICD-10-CM | POA: Diagnosis not present

## 2014-06-03 DIAGNOSIS — I10 Essential (primary) hypertension: Secondary | ICD-10-CM | POA: Diagnosis not present

## 2014-06-03 DIAGNOSIS — M4854XA Collapsed vertebra, not elsewhere classified, thoracic region, initial encounter for fracture: Secondary | ICD-10-CM | POA: Diagnosis not present

## 2014-06-03 DIAGNOSIS — K219 Gastro-esophageal reflux disease without esophagitis: Secondary | ICD-10-CM | POA: Diagnosis not present

## 2014-06-03 DIAGNOSIS — I2510 Atherosclerotic heart disease of native coronary artery without angina pectoris: Secondary | ICD-10-CM | POA: Diagnosis not present

## 2014-06-03 DIAGNOSIS — J45909 Unspecified asthma, uncomplicated: Secondary | ICD-10-CM | POA: Diagnosis not present

## 2014-06-03 DIAGNOSIS — F419 Anxiety disorder, unspecified: Secondary | ICD-10-CM | POA: Diagnosis not present

## 2014-06-03 DIAGNOSIS — I252 Old myocardial infarction: Secondary | ICD-10-CM | POA: Diagnosis not present

## 2014-06-03 DIAGNOSIS — F329 Major depressive disorder, single episode, unspecified: Secondary | ICD-10-CM | POA: Diagnosis not present

## 2014-06-03 DIAGNOSIS — M1990 Unspecified osteoarthritis, unspecified site: Secondary | ICD-10-CM | POA: Diagnosis not present

## 2014-06-03 LAB — CBC WITH DIFFERENTIAL/PLATELET
BASOS ABS: 0.1 10*3/uL (ref 0.0–0.1)
Basophils Relative: 1 % (ref 0–1)
Eosinophils Absolute: 0.6 10*3/uL (ref 0.0–0.7)
Eosinophils Relative: 7 % — ABNORMAL HIGH (ref 0–5)
HCT: 39.2 % (ref 36.0–46.0)
Hemoglobin: 13 g/dL (ref 12.0–15.0)
LYMPHS PCT: 37 % (ref 12–46)
Lymphs Abs: 3.4 10*3/uL (ref 0.7–4.0)
MCH: 30.2 pg (ref 26.0–34.0)
MCHC: 33.2 g/dL (ref 30.0–36.0)
MCV: 91.2 fL (ref 78.0–100.0)
Monocytes Absolute: 0.6 10*3/uL (ref 0.1–1.0)
Monocytes Relative: 7 % (ref 3–12)
NEUTROS ABS: 4.5 10*3/uL (ref 1.7–7.7)
NEUTROS PCT: 48 % (ref 43–77)
PLATELETS: 244 10*3/uL (ref 150–400)
RBC: 4.3 MIL/uL (ref 3.87–5.11)
RDW: 13.2 % (ref 11.5–15.5)
WBC: 9.2 10*3/uL (ref 4.0–10.5)

## 2014-06-03 LAB — COMPREHENSIVE METABOLIC PANEL
ALBUMIN: 4.1 g/dL (ref 3.5–5.2)
ALT: 19 U/L (ref 0–35)
AST: 26 U/L (ref 0–37)
Alkaline Phosphatase: 120 U/L — ABNORMAL HIGH (ref 39–117)
Anion gap: 7 (ref 5–15)
BILIRUBIN TOTAL: 0.5 mg/dL (ref 0.3–1.2)
BUN: 15 mg/dL (ref 6–23)
CALCIUM: 9.4 mg/dL (ref 8.4–10.5)
CHLORIDE: 102 mmol/L (ref 96–112)
CO2: 30 mmol/L (ref 19–32)
CREATININE: 0.88 mg/dL (ref 0.50–1.10)
GFR calc Af Amer: 76 mL/min — ABNORMAL LOW (ref >90)
GFR calc non Af Amer: 66 mL/min — ABNORMAL LOW (ref >90)
Glucose, Bld: 89 mg/dL (ref 70–99)
Potassium: 4.1 mmol/L (ref 3.5–5.1)
Sodium: 139 mmol/L (ref 135–145)
Total Protein: 7.3 g/dL (ref 6.0–8.3)

## 2014-06-03 LAB — URINALYSIS, ROUTINE W REFLEX MICROSCOPIC
Bilirubin Urine: NEGATIVE
GLUCOSE, UA: NEGATIVE mg/dL
Ketones, ur: NEGATIVE mg/dL
Leukocytes, UA: NEGATIVE
Nitrite: NEGATIVE
Protein, ur: NEGATIVE mg/dL
SPECIFIC GRAVITY, URINE: 1.014 (ref 1.005–1.030)
Urobilinogen, UA: 0.2 mg/dL (ref 0.0–1.0)
pH: 6 (ref 5.0–8.0)

## 2014-06-03 LAB — APTT: aPTT: 28 seconds (ref 24–37)

## 2014-06-03 LAB — PROTIME-INR
INR: 0.99 (ref 0.00–1.49)
Prothrombin Time: 13.2 seconds (ref 11.6–15.2)

## 2014-06-03 LAB — URINE MICROSCOPIC-ADD ON

## 2014-06-03 LAB — SURGICAL PCR SCREEN
MRSA, PCR: NEGATIVE
Staphylococcus aureus: POSITIVE — AB

## 2014-06-03 NOTE — Progress Notes (Signed)
   06/03/14 1548  OBSTRUCTIVE SLEEP APNEA  Have you ever been diagnosed with sleep apnea through a sleep study? No  Do you snore loudly (loud enough to be heard through closed doors)?  0  Do you often feel tired, fatigued, or sleepy during the daytime? 1  Has anyone observed you stop breathing during your sleep? 1  Do you have, or are you being treated for high blood pressure? 1  BMI more than 35 kg/m2? 1  Age over 69 years old? 1  Neck circumference greater than 40 cm/16 inches? 1 (16.5)  Gender: 0

## 2014-06-03 NOTE — Progress Notes (Signed)
I called a prescription for Mupirocin ointment to [facility], [address] [employer], [place], [place].

## 2014-06-03 NOTE — Progress Notes (Signed)
I called patient and notified her to stop Naproxen.  Patient voiced understanding.

## 2014-06-03 NOTE — Pre-Procedure Instructions (Signed)
Paula Booker  06/03/2014   Your procedure is scheduled on:  Thursday, February 25.  Report to [facility] Admitting at 9:05 AM.  Call this number if you have problems the morning of surgery: [phone]   Remember:   Do not eat food or drink liquids after midnight Wednesday, February 24.   Take these medicines the morning of surgery with A SIP OF WATER: carvedilol (COREG), cetirizine (ZYRTEC), citalopram (CELEXA),  omeprazole (PRILOSEC). May use Albuterol and please bring inhaler to the hospital with you.              Use Flonase.  Take if needed:HYDROcodone-acetaminophen (NORCO/VICODIN)   Do not wear jewelry, make-up or nail polish.  Do not wear lotions, powders, or perfumes.  Do not shave 48 hours prior to surgery.  Do not bring valuables to the hospital.              [facility] is not responsible  for any belongings or valuables.               Contacts, dentures or bridgework may not be worn into surgery.  Leave suitcase in the car. After surgery it may be brought to your room.  For patients admitted to the hospital, discharge time is determined by your treatment team.               Patients discharged the day of surgery will not be allowed to drive home.  Name and phone number of your driver: -   Special Instructions: Review  [place] - Preparing For Surgery.   Please read over the following fact sheets that you were given: Pain Booklet, Coughing and Deep Breathing and Surgical Site Infection Prevention and Incentive Spirometry.

## 2014-06-04 MED ORDER — VANCOMYCIN HCL IN DEXTROSE 1-5 GM/200ML-% IV SOLN
1000.0000 mg | INTRAVENOUS | Status: AC
  Administered 2014-06-05: 1000 mg via INTRAVENOUS
  Filled 2014-06-04: qty 200

## 2014-06-04 MED ORDER — POVIDONE-IODINE 7.5 % EX SOLN
Freq: Once | CUTANEOUS | Status: DC
  Filled 2014-06-04: qty 118

## 2014-06-04 NOTE — H&P (Signed)
PREOPERATIVE H&P  Chief Complaint: back pain  HPI: Paula Booker is a 69 y.o. female who presents with ongoing pain in the mid-back  MRI and radiographs reveal T7 compression fracture  Patient has failed multiple forms of conservative care and continues to have pain (see office notes for additional details regarding the patient's full course of treatment)  Past Medical History  Diagnosis Date  . Asthma   . GERD (gastroesophageal reflux disease)   . Hypertension   . ICD (implantable cardiac defibrillator) in place Dec 2013  . Shortness of breath     "@ any time I can get SOB" (03/22/2012)  . Migraines   . Skin cancer 1999  . WPW (Wolff-Parkinson-White syndrome)   . Pacemaker   . CHF (congestive heart failure) 6/13  . Pneumonia     hx  . Anxiety   . NICM (nonischemic cardiomyopathy)   . Obesity (BMI 30-39.9)     negative sleep study 2014  . Head injury, acute, with loss of consciousness   . Depression   . Arthritis     "hands and knees" (03/22/2012)   Past Surgical History  Procedure Laterality Date  . Cardiac defibrillator placement  03/22/2012    DDD  . Cholecystectomy  ~ 2003  . Tonsillectomy and adenoidectomy  1950  . Skin cancer excision  1999    "top of my head and my right back shoulder"  . Mohs surgery  06/2011    "forehead" (03/22/2012)  . Supraventricular tachycardia ablation  03/22/2012    unable to induce VT/RN report 03/22/2012  . Tubal ligation  1978  . Kyphoplasty N/A 11/27/2013    Procedure: KYPHOPLASTY;  Surgeon: Sinclair Ship, MD;  Location: [place];  Service: Orthopedics;  Laterality: N/A;  T9, T11 kyphoplasty  . Cardiac catheterization  09/18/11    no significant CAD  . Supraventricular tachycardia ablation N/A 03/22/2012    Procedure: SUPRAVENTRICULAR TACHYCARDIA ABLATION;  Surgeon: Evans Lance, MD;  Location: [facility] CATH LAB;  Service: Cardiovascular;  Laterality: N/A;  . Implantable cardioverter defibrillator implant N/A 03/22/2012      Procedure: IMPLANTABLE CARDIOVERTER DEFIBRILLATOR IMPLANT;  Surgeon: Evans Lance, MD;  Location: [facility] C[facility] LAB;  Service: Cardiovascular;  Laterality: N/A;  . Skin cancer excision      back  . Skin cancer excision      face   History   Social History  . Marital Status: Divorced    Spouse Name: N/A  . Number of Children: 3  . Years of Education: N/A   Occupational History  . retired     Optometrist   Social History Main Topics  . Smoking status: Never Smoker   . Smokeless tobacco: Never Used  . Alcohol Use: No  . Drug Use: No  . Sexual Activity: Not on file   Other Topics Concern  . None   Social History Narrative   Divorced, 3 children, 9 grandchildren   Family History  Problem Relation Age of Onset  . Tuberculosis Paternal Grandfather   . Congestive Heart Failure Brother   . Congestive Heart Failure Mother     died 9   Allergies  Allergen Reactions  . Ivp Dye [Iodinated Diagnostic Agents] Anaphylaxis, Hives, Swelling and Other (See Comments)    Throat swell  . Penicillins Anaphylaxis  . Sulfonamide Derivatives Other (See Comments)    Migraines  . Onion Other (See Comments)    Raw onions cause migraines   Prior to Admission medications  Medication Sig Start Date End Date Taking? Authorizing Provider  albuterol (PROVENTIL HFA;VENTOLIN HFA) 108 (90 BASE) MCG/ACT inhaler Inhale 2 puffs into the lungs every 6 (six) hours as needed. For wheezing   Yes Historical Provider, MD  alendronate (FOSAMAX) 70 MG tablet Take 70 mg by mouth once a week. Take with a full glass of water on an empty stomach.   Yes Historical Provider, MD  carvedilol (COREG) 12.5 MG tablet Take 1 tablet (12.5 mg total) by mouth 2 (two) times daily with a meal. 03/27/14  Yes Jettie Booze, MD  cetirizine (ZYRTEC) 10 MG tablet Take 10 mg by mouth daily.   Yes Historical Provider, MD  Cholecalciferol (VITAMIN D3) 1000 UNITS CAPS Take 1,000 Units by mouth daily.    Yes Historical Provider,  MD  citalopram (CELEXA) 20 MG tablet Take 20 mg by mouth daily.   Yes Historical Provider, MD  cyclobenzaprine (FLEXERIL) 5 MG tablet Take 5 mg by mouth at bedtime.   Yes Historical Provider, MD  fluticasone (FLONASE) 50 MCG/ACT nasal spray Place 1 spray into both nostrils daily. 01/22/14  Yes Historical Provider, MD  furosemide (LASIX) 40 MG tablet Take 1 tablet (40 mg total) by mouth daily. 12/11/13  Yes Jettie Booze, MD  gelatin 650 MG capsule Take 1,300 mg by mouth daily.   Yes Historical Provider, MD  HYDROcodone-acetaminophen (NORCO/VICODIN) 5-325 MG per tablet Take 1-2 tablets by mouth 3 (three) times daily as needed for moderate pain.   Yes Historical Provider, MD  losartan (COZAAR) 50 MG tablet Take 1 tablet (50 mg total) by mouth daily. 09/16/13  Yes Jettie Booze, MD  Misc Natural Products (OSTEO BI-FLEX ADV DOUBLE ST PO) Take 2 tablets by mouth every morning.    Yes Historical Provider, MD  Multiple Vitamins-Minerals (MULTIVITAMIN PO) Take 1 tablet by mouth daily. ALIVE   Yes Historical Provider, MD  naproxen sodium (ANAPROX) 220 MG tablet Take 440 mg by mouth 2 (two) times daily with a meal.   Yes Historical Provider, MD  omeprazole (PRILOSEC) 20 MG capsule Take 20 mg by mouth daily.    Yes Historical Provider, MD  spironolactone (ALDACTONE) 25 MG tablet Take 1 tablet (25 mg total) by mouth daily. 03/27/14  Yes Jettie Booze, MD  potassium chloride (K-DUR,KLOR-CON) 10 MEQ tablet Take 10 mEq by mouth 2 (two) times daily.    Historical Provider, MD     All other systems have been reviewed and were otherwise negative with the exception of those mentioned in the HPI and as above.  Physical Exam: There were no vitals filed for this visit.  General: Alert, no acute distress Cardiovascular: No pedal edema Respiratory: No cyanosis, no use of accessory musculature Skin: No lesions in the area of chief complaint Neurologic: Sensation intact distally Psychiatric: Patient  is competent for consent with normal mood and affect Lymphatic: No axillary or cervical lymphadenopathy  MUSCULOSKELETAL: + TTP mid-back  Assessment/Plan: T7 kyphoplasty Plan for Procedure(s): KYPHOPLASTY   Sinclair Ship, MD 06/04/2014 3:07 PM

## 2014-06-04 NOTE — Progress Notes (Signed)
Anesthesia chart review: Patient is a 69 year old female scheduled for T7 kyphoplasty tomorrow by Dr. Lynann Bologna.   History includes non-smoker, non-ischemic cardiomyopathy, chronic systolic CHF, recurrent syncope with WPW pattern on EKG s/p EPS showing no inducible SVT/VT s/p Medtronic dual chamber ICD insertion 03/22/12, HTN, GERD, asthma, arthritis, anxiety, skin cancer s/p Mohs, migraines, cholecystectomy, T&A., T9 and T11 kyphoplasty 11/27/13. BMI is consistent with obesity. OSA screening score was elevated at 6. PCP is Dr. Antony Blackbird.  Primary cardiologist is Dr. Irish Lack, last visit 03/27/14 with no new testing ordered.  ED visit work-up for chest pain with chest well tenderness on 01/27/14 showed a negative troponin and negative CTA of the chest/and abdomen. EP cardiologist is Dr. Lovena Le, last visit on 03/25/14 and showed Medtronic dual-chamber ICD was working normally. No ICD shocks, however, RV defib impedance measurement reached 102 ohms and her RV defib impedance alert max was increased from 100 ohms to 130 ohms on 04/15/14.   Echo on 10/03/13 showed: - Left ventricle: LVEF is depressed at approximately 25% with diffuse hypokinesis. The cavity size was severely dilated. Wall thickness was normal. - Left atrium: The atrium was mildly dilated. - Pulmonary arteries: PA peak pressure: 36 mm Hg (S). - Trivial mitral and tricuspid regurgitation. Continued medical therapy was recommended. (Previous EF on 02/20/2012 was 20-25%.)  Cardiac cath on 09/12/11 showed: 1. No significant coronary artery disease (angiographically normal). Left dominant system. 2. Severe, global left ventricular systolic dysfunction. LVEDP 28 mmHg. Ejection fraction 20-25 %. 3. Moderate pulmonary hypertension. Cardiac index greater than 2.  EKG on 03/27/14 (from her visit with Dr. Irish Lack) showed NSR, left BBB, delta wave.  She also has a rather diffuse ST/T wave abnormality which has been present intermittently on her EKGs  since at least 04/2013. She has had intermittent left BBB as well on prior EKGs.   PFT's 10/17/2012 PFTs 10/17/2012 FEV1 1.68 (75%) ratio 73 and no change p B2, DLCO 93.  1V CXR 01/27/14: IMPRESSION: 1. Stable appearance of dual chamber pacemaker. 2. Low lung volumes. 3. No acute cardiopulmonary disease.   Preoperative labs noted.   She was seen by her primary and EP cardiologists within the past three months.  She had a stable echo within the past year and normal coronaries by cath less than three years ago. Her ICD was interrogated last month. She underwent similar procedure approximately six months ago. If no acute changes then I would anticipate that she could proceed as planned.  George Hugh [facility] Short Stay Center/Anesthesiology Phone [phone] 06/04/2014 9:50 AM

## 2014-06-05 ENCOUNTER — Ambulatory Visit (HOSPITAL_COMMUNITY): Payer: Medicare Other

## 2014-06-05 ENCOUNTER — Ambulatory Visit (HOSPITAL_COMMUNITY)
Admission: RE | Admit: 2014-06-05 | Discharge: 2014-06-06 | Disposition: A | Discharge status: Home / Self Care | Payer: Medicare Other | Source: Ambulatory Visit | Attending: Orthopedic Surgery | Admitting: Orthopedic Surgery

## 2014-06-05 ENCOUNTER — Ambulatory Visit (HOSPITAL_COMMUNITY): Payer: Medicare Other | Admitting: Certified Registered Nurse Anesthetist

## 2014-06-05 ENCOUNTER — Ambulatory Visit (HOSPITAL_COMMUNITY): Payer: Medicare Other | Admitting: Vascular Surgery

## 2014-06-05 ENCOUNTER — Encounter (HOSPITAL_COMMUNITY): Payer: Self-pay | Admitting: Certified Registered Nurse Anesthetist

## 2014-06-05 ENCOUNTER — Encounter (HOSPITAL_COMMUNITY)
Admission: RE | Disposition: A | Discharge status: Home / Self Care | Payer: Self-pay | Source: Ambulatory Visit | Attending: Orthopedic Surgery

## 2014-06-05 DIAGNOSIS — M4854XA Collapsed vertebra, not elsewhere classified, thoracic region, initial encounter for fracture: Secondary | ICD-10-CM | POA: Diagnosis not present

## 2014-06-05 DIAGNOSIS — I252 Old myocardial infarction: Secondary | ICD-10-CM | POA: Diagnosis not present

## 2014-06-05 DIAGNOSIS — F419 Anxiety disorder, unspecified: Secondary | ICD-10-CM | POA: Insufficient documentation

## 2014-06-05 DIAGNOSIS — J45909 Unspecified asthma, uncomplicated: Secondary | ICD-10-CM | POA: Diagnosis not present

## 2014-06-05 DIAGNOSIS — M1990 Unspecified osteoarthritis, unspecified site: Secondary | ICD-10-CM | POA: Insufficient documentation

## 2014-06-05 DIAGNOSIS — I10 Essential (primary) hypertension: Secondary | ICD-10-CM | POA: Diagnosis not present

## 2014-06-05 DIAGNOSIS — IMO0002 Reserved for concepts with insufficient information to code with codable children: Secondary | ICD-10-CM

## 2014-06-05 DIAGNOSIS — I509 Heart failure, unspecified: Secondary | ICD-10-CM | POA: Insufficient documentation

## 2014-06-05 DIAGNOSIS — I2510 Atherosclerotic heart disease of native coronary artery without angina pectoris: Secondary | ICD-10-CM | POA: Insufficient documentation

## 2014-06-05 DIAGNOSIS — Z419 Encounter for procedure for purposes other than remedying health state, unspecified: Secondary | ICD-10-CM

## 2014-06-05 DIAGNOSIS — F329 Major depressive disorder, single episode, unspecified: Secondary | ICD-10-CM | POA: Insufficient documentation

## 2014-06-05 DIAGNOSIS — K219 Gastro-esophageal reflux disease without esophagitis: Secondary | ICD-10-CM | POA: Insufficient documentation

## 2014-06-05 HISTORY — PX: KYPHOPLASTY: SHX5884

## 2014-06-05 HISTORY — DX: Unspecified intracranial injury with loss of consciousness of unspecified duration, initial encounter: S06.9X9A

## 2014-06-05 HISTORY — DX: Depression, unspecified: F32.A

## 2014-06-05 HISTORY — DX: Major depressive disorder, single episode, unspecified: F32.9

## 2014-06-05 SURGERY — KYPHOPLASTY
Anesthesia: General

## 2014-06-05 MED ORDER — ACETAMINOPHEN 325 MG PO TABS: 325.0000 mg | ORAL_TABLET | ORAL | Status: DC | PRN

## 2014-06-05 MED ORDER — SODIUM CHLORIDE 0.9 % IJ SOLN: 3.0000 mL | INTRAMUSCULAR | Status: DC | PRN

## 2014-06-05 MED ORDER — DOCUSATE SODIUM 100 MG PO CAPS
100.0000 mg | ORAL_CAPSULE | Freq: Two times a day (BID) | ORAL | Status: DC
  Administered 2014-06-05: 100 mg via ORAL
  Filled 2014-06-05: qty 1

## 2014-06-05 MED ORDER — FENTANYL CITRATE 0.05 MG/ML IJ SOLN
INTRAMUSCULAR | Status: DC | PRN
  Administered 2014-06-05: 150 ug via INTRAVENOUS

## 2014-06-05 MED ORDER — FENTANYL CITRATE 0.05 MG/ML IJ SOLN
INTRAMUSCULAR | Status: AC
  Filled 2014-06-05: qty 5

## 2014-06-05 MED ORDER — LACTATED RINGERS IV SOLN
INTRAVENOUS | Status: DC | PRN
  Administered 2014-06-05 (×2): via INTRAVENOUS

## 2014-06-05 MED ORDER — MUPIROCIN 2 % EX OINT: TOPICAL_OINTMENT | Freq: Two times a day (BID) | CUTANEOUS | Status: DC

## 2014-06-05 MED ORDER — CITALOPRAM HYDROBROMIDE 20 MG PO TABS
20.0000 mg | ORAL_TABLET | Freq: Every day | ORAL | Status: DC
  Filled 2014-06-05: qty 1

## 2014-06-05 MED ORDER — SPIRONOLACTONE 25 MG PO TABS
25.0000 mg | ORAL_TABLET | Freq: Every day | ORAL | Status: DC
  Administered 2014-06-05: 25 mg via ORAL
  Filled 2014-06-05 (×2): qty 1

## 2014-06-05 MED ORDER — OXYCODONE HCL 5 MG PO TABS
5.0000 mg | ORAL_TABLET | Freq: Once | ORAL | Status: AC | PRN
  Administered 2014-06-05: 5 mg via ORAL

## 2014-06-05 MED ORDER — PROPOFOL 10 MG/ML IV BOLUS
INTRAVENOUS | Status: DC | PRN
  Administered 2014-06-05: 110 mg via INTRAVENOUS

## 2014-06-05 MED ORDER — ONDANSETRON HCL 4 MG/2ML IJ SOLN: 4.0000 mg | INTRAMUSCULAR | Status: DC | PRN

## 2014-06-05 MED ORDER — SENNOSIDES-DOCUSATE SODIUM 8.6-50 MG PO TABS
1.0000 | ORAL_TABLET | Freq: Every evening | ORAL | Status: DC | PRN
  Filled 2014-06-05: qty 1

## 2014-06-05 MED ORDER — FENTANYL CITRATE 0.05 MG/ML IJ SOLN
25.0000 ug | INTRAMUSCULAR | Status: DC | PRN
  Administered 2014-06-05: 25 ug via INTRAVENOUS

## 2014-06-05 MED ORDER — IOHEXOL 300 MG/ML  SOLN
INTRAMUSCULAR | Status: DC | PRN
Start: 2014-06-05 — End: 2014-06-05
  Administered 2014-06-05: 300 mL via INTRAVENOUS

## 2014-06-05 MED ORDER — MIDAZOLAM HCL 5 MG/5ML IJ SOLN
INTRAMUSCULAR | Status: DC | PRN
  Administered 2014-06-05: 2 mg via INTRAVENOUS

## 2014-06-05 MED ORDER — FLEET ENEMA 7-19 GM/118ML RE ENEM
1.0000 | ENEMA | Freq: Once | RECTAL | Status: AC | PRN
  Filled 2014-06-05: qty 1

## 2014-06-05 MED ORDER — PANTOPRAZOLE SODIUM 40 MG PO TBEC: 40.0000 mg | DELAYED_RELEASE_TABLET | Freq: Every day | ORAL | Status: DC

## 2014-06-05 MED ORDER — ARTIFICIAL TEARS OP OINT
TOPICAL_OINTMENT | OPHTHALMIC | Status: AC
  Filled 2014-06-05: qty 3.5

## 2014-06-05 MED ORDER — PHENYLEPHRINE HCL 10 MG/ML IJ SOLN
10.0000 mg | INTRAMUSCULAR | Status: DC | PRN
  Administered 2014-06-05: 20 ug/min via INTRAVENOUS

## 2014-06-05 MED ORDER — ROCURONIUM BROMIDE 100 MG/10ML IV SOLN
INTRAVENOUS | Status: DC | PRN
  Administered 2014-06-05: 30 mg via INTRAVENOUS

## 2014-06-05 MED ORDER — DEXAMETHASONE SODIUM PHOSPHATE 4 MG/ML IJ SOLN
INTRAMUSCULAR | Status: AC
  Filled 2014-06-05: qty 2

## 2014-06-05 MED ORDER — PROPOFOL 10 MG/ML IV BOLUS
INTRAVENOUS | Status: AC
  Filled 2014-06-05: qty 20

## 2014-06-05 MED ORDER — GLYCOPYRROLATE 0.2 MG/ML IJ SOLN
INTRAMUSCULAR | Status: AC
  Filled 2014-06-05: qty 3

## 2014-06-05 MED ORDER — ALBUTEROL SULFATE HFA 108 (90 BASE) MCG/ACT IN AERS
INHALATION_SPRAY | RESPIRATORY_TRACT | Status: DC | PRN
  Administered 2014-06-05: 8 via RESPIRATORY_TRACT

## 2014-06-05 MED ORDER — ALUM & MAG HYDROXIDE-SIMETH 200-200-20 MG/5ML PO SUSP: 30.0000 mL | Freq: Four times a day (QID) | ORAL | Status: DC | PRN

## 2014-06-05 MED ORDER — GLYCOPYRROLATE 0.2 MG/ML IJ SOLN
INTRAMUSCULAR | Status: DC | PRN
  Administered 2014-06-05: 0.6 mg via INTRAVENOUS

## 2014-06-05 MED ORDER — MIDAZOLAM HCL 2 MG/2ML IJ SOLN
INTRAMUSCULAR | Status: AC
  Filled 2014-06-05: qty 2

## 2014-06-05 MED ORDER — BACITRACIN ZINC 500 UNIT/GM EX OINT
TOPICAL_OINTMENT | CUTANEOUS | Status: AC
  Filled 2014-06-05: qty 28.35

## 2014-06-05 MED ORDER — DIPHENHYDRAMINE HCL 50 MG/ML IJ SOLN
INTRAMUSCULAR | Status: DC | PRN
  Administered 2014-06-05: 25 mg via INTRAVENOUS

## 2014-06-05 MED ORDER — VANCOMYCIN HCL IN DEXTROSE 1-5 GM/200ML-% IV SOLN
1000.0000 mg | Freq: Once | INTRAVENOUS | Status: AC
  Administered 2014-06-05: 1000 mg via INTRAVENOUS
  Filled 2014-06-05: qty 200

## 2014-06-05 MED ORDER — FENTANYL CITRATE 0.05 MG/ML IJ SOLN
INTRAMUSCULAR | Status: AC
  Filled 2014-06-05: qty 2

## 2014-06-05 MED ORDER — MENTHOL 3 MG MT LOZG: 1.0000 | LOZENGE | OROMUCOSAL | Status: DC | PRN

## 2014-06-05 MED ORDER — FLUTICASONE PROPIONATE 50 MCG/ACT NA SUSP
1.0000 | Freq: Every day | NASAL | Status: DC
  Filled 2014-06-05: qty 16

## 2014-06-05 MED ORDER — MORPHINE SULFATE 2 MG/ML IJ SOLN: 1.0000 mg | INTRAMUSCULAR | Status: DC | PRN

## 2014-06-05 MED ORDER — ACETAMINOPHEN 650 MG RE SUPP: 650.0000 mg | RECTAL | Status: DC | PRN

## 2014-06-05 MED ORDER — ZOLPIDEM TARTRATE 5 MG PO TABS: 5.0000 mg | ORAL_TABLET | Freq: Every evening | ORAL | Status: DC | PRN

## 2014-06-05 MED ORDER — OXYCODONE-ACETAMINOPHEN 5-325 MG PO TABS
1.0000 | ORAL_TABLET | ORAL | Status: DC | PRN
  Administered 2014-06-06: 1 via ORAL
  Filled 2014-06-05 (×2): qty 1

## 2014-06-05 MED ORDER — VITAMIN D 1000 UNITS PO TABS
1000.0000 [IU] | ORAL_TABLET | Freq: Every day | ORAL | Status: DC
  Administered 2014-06-05: 1000 [IU] via ORAL
  Filled 2014-06-05 (×2): qty 1

## 2014-06-05 MED ORDER — LOSARTAN POTASSIUM 50 MG PO TABS
50.0000 mg | ORAL_TABLET | Freq: Every day | ORAL | Status: DC
  Administered 2014-06-05: 50 mg via ORAL
  Filled 2014-06-05 (×2): qty 1

## 2014-06-05 MED ORDER — DIAZEPAM 5 MG PO TABS
ORAL_TABLET | ORAL | Status: AC
  Filled 2014-06-05: qty 1

## 2014-06-05 MED ORDER — OXYCODONE HCL 5 MG PO TABS
ORAL_TABLET | ORAL | Status: AC
  Filled 2014-06-05: qty 1

## 2014-06-05 MED ORDER — ALENDRONATE SODIUM 70 MG PO TABS: 70.0000 mg | ORAL_TABLET | ORAL | Status: DC

## 2014-06-05 MED ORDER — LORATADINE 10 MG PO TABS
10.0000 mg | ORAL_TABLET | Freq: Every day | ORAL | Status: DC
  Filled 2014-06-05: qty 1

## 2014-06-05 MED ORDER — ALBUTEROL SULFATE HFA 108 (90 BASE) MCG/ACT IN AERS
INHALATION_SPRAY | RESPIRATORY_TRACT | Status: AC
  Filled 2014-06-05: qty 6.7

## 2014-06-05 MED ORDER — NEOSTIGMINE METHYLSULFATE 10 MG/10ML IV SOLN
INTRAVENOUS | Status: AC
  Filled 2014-06-05: qty 1

## 2014-06-05 MED ORDER — FENTANYL CITRATE 0.05 MG/ML IJ SOLN
INTRAMUSCULAR | Status: AC
  Administered 2014-06-05: 100 ug via INTRAVENOUS
  Filled 2014-06-05: qty 2

## 2014-06-05 MED ORDER — ACETAMINOPHEN 325 MG PO TABS: 650.0000 mg | ORAL_TABLET | ORAL | Status: DC | PRN

## 2014-06-05 MED ORDER — LIDOCAINE HCL (CARDIAC) 20 MG/ML IV SOLN
INTRAVENOUS | Status: AC
  Filled 2014-06-05: qty 5

## 2014-06-05 MED ORDER — CARVEDILOL 12.5 MG PO TABS
12.5000 mg | ORAL_TABLET | Freq: Two times a day (BID) | ORAL | Status: DC
  Administered 2014-06-05 – 2014-06-06 (×2): 12.5 mg via ORAL
  Filled 2014-06-05 (×4): qty 1

## 2014-06-05 MED ORDER — DIAZEPAM 5 MG PO TABS
5.0000 mg | ORAL_TABLET | Freq: Four times a day (QID) | ORAL | Status: DC | PRN
  Administered 2014-06-05: 5 mg via ORAL

## 2014-06-05 MED ORDER — ACETAMINOPHEN 160 MG/5ML PO SOLN
325.0000 mg | ORAL | Status: DC | PRN
  Filled 2014-06-05: qty 20.3

## 2014-06-05 MED ORDER — NEOSTIGMINE METHYLSULFATE 10 MG/10ML IV SOLN
INTRAVENOUS | Status: DC | PRN
  Administered 2014-06-05: 4 mg via INTRAVENOUS

## 2014-06-05 MED ORDER — ALBUTEROL SULFATE HFA 108 (90 BASE) MCG/ACT IN AERS: 2.0000 | INHALATION_SPRAY | Freq: Four times a day (QID) | RESPIRATORY_TRACT | Status: DC | PRN

## 2014-06-05 MED ORDER — BISACODYL 5 MG PO TBEC
5.0000 mg | DELAYED_RELEASE_TABLET | Freq: Every day | ORAL | Status: DC | PRN
  Filled 2014-06-05: qty 1

## 2014-06-05 MED ORDER — FENTANYL CITRATE 0.05 MG/ML IJ SOLN
100.0000 ug | Freq: Once | INTRAMUSCULAR | Status: AC
  Administered 2014-06-05: 100 ug via INTRAVENOUS

## 2014-06-05 MED ORDER — PHENOL 1.4 % MT LIQD: 1.0000 | OROMUCOSAL | Status: DC | PRN

## 2014-06-05 MED ORDER — OXYCODONE HCL 5 MG/5ML PO SOLN: 5.0000 mg | Freq: Once | ORAL | Status: AC | PRN

## 2014-06-05 MED ORDER — ALBUTEROL SULFATE (2.5 MG/3ML) 0.083% IN NEBU: 2.5000 mg | INHALATION_SOLUTION | Freq: Four times a day (QID) | RESPIRATORY_TRACT | Status: DC | PRN

## 2014-06-05 MED ORDER — SODIUM CHLORIDE 0.9 % IJ SOLN
3.0000 mL | Freq: Two times a day (BID) | INTRAMUSCULAR | Status: DC
Start: 2014-06-05 — End: 2014-06-06
  Administered 2014-06-05: 3 mL via INTRAVENOUS

## 2014-06-05 MED ORDER — FUROSEMIDE 40 MG PO TABS
40.0000 mg | ORAL_TABLET | Freq: Every day | ORAL | Status: DC
  Administered 2014-06-05: 40 mg via ORAL
  Filled 2014-06-05 (×3): qty 1

## 2014-06-05 MED ORDER — ONDANSETRON HCL 4 MG/2ML IJ SOLN
INTRAMUSCULAR | Status: DC | PRN
  Administered 2014-06-05: 4 mg via INTRAVENOUS

## 2014-06-05 MED ORDER — PHENYLEPHRINE HCL 10 MG/ML IJ SOLN
INTRAMUSCULAR | Status: DC | PRN
  Administered 2014-06-05: 120 ug via INTRAVENOUS
  Administered 2014-06-05: 80 ug via INTRAVENOUS

## 2014-06-05 MED ORDER — ARTIFICIAL TEARS OP OINT
TOPICAL_OINTMENT | OPHTHALMIC | Status: DC | PRN
  Administered 2014-06-05: 1 via OPHTHALMIC

## 2014-06-05 MED ORDER — DIPHENHYDRAMINE HCL 50 MG/ML IJ SOLN
INTRAMUSCULAR | Status: AC
  Filled 2014-06-05: qty 1

## 2014-06-05 MED ORDER — LIDOCAINE HCL (CARDIAC) 20 MG/ML IV SOLN
INTRAVENOUS | Status: DC | PRN
  Administered 2014-06-05: 70 mg via INTRAVENOUS

## 2014-06-05 MED ORDER — ONDANSETRON HCL 4 MG/2ML IJ SOLN
INTRAMUSCULAR | Status: AC
  Filled 2014-06-05: qty 2

## 2014-06-05 MED ORDER — POTASSIUM CHLORIDE CRYS ER 10 MEQ PO TBCR
10.0000 meq | EXTENDED_RELEASE_TABLET | Freq: Two times a day (BID) | ORAL | Status: DC
  Administered 2014-06-05: 10 meq via ORAL
  Filled 2014-06-05 (×3): qty 1

## 2014-06-05 MED ORDER — PHENYLEPHRINE HCL 10 MG/ML IJ SOLN
INTRAMUSCULAR | Status: AC
  Filled 2014-06-05: qty 1

## 2014-06-05 SURGICAL SUPPLY — 47 items
ADH SKN CLS APL DERMABOND .7 (GAUZE/BANDAGES/DRESSINGS)
BANDAGE ADH SHEER 1  50/CT (GAUZE/BANDAGES/DRESSINGS) ×4 IMPLANT
BLADE SURG 15 STRL LF DISP TIS (BLADE) ×1 IMPLANT
BLADE SURG 15 STRL SS (BLADE) ×2
CEMENT BONE KYPHX HV R (Orthopedic Implant) ×1 IMPLANT
CEMENT KYPHON C01A KIT/MIXER (Cement) ×1 IMPLANT
COVER MAYO STAND STRL (DRAPES) ×3 IMPLANT
COVER SURGICAL LIGHT HANDLE (MISCELLANEOUS) ×2 IMPLANT
CURETTE WEDGE 8.5MM KYPHX (MISCELLANEOUS) IMPLANT
DERMABOND ADVANCED (GAUZE/BANDAGES/DRESSINGS)
DERMABOND ADVANCED .7 DNX12 (GAUZE/BANDAGES/DRESSINGS) IMPLANT
DRAPE C-ARM 42X72 X-RAY (DRAPES) ×2 IMPLANT
DRAPE INCISE IOBAN 66X45 STRL (DRAPES) ×2 IMPLANT
DRAPE LAPAROTOMY T 102X78X121 (DRAPES) ×2 IMPLANT
DRAPE SURG 17X23 STRL (DRAPES) ×8 IMPLANT
DURAPREP 26ML APPLICATOR (WOUND CARE) ×2 IMPLANT
GAUZE SPONGE 4X4 16PLY XRAY LF (GAUZE/BANDAGES/DRESSINGS) ×2 IMPLANT
GLOVE BIO SURGEON STRL SZ7 (GLOVE) ×2 IMPLANT
GLOVE BIO SURGEON STRL SZ8 (GLOVE) ×2 IMPLANT
GLOVE BIOGEL PI IND STRL 7.0 (GLOVE) ×1 IMPLANT
GLOVE BIOGEL PI IND STRL 8 (GLOVE) ×1 IMPLANT
GLOVE BIOGEL PI INDICATOR 7.0 (GLOVE) ×1
GLOVE BIOGEL PI INDICATOR 8 (GLOVE) ×1
GOWN STRL REUS W/ TWL LRG LVL3 (GOWN DISPOSABLE) ×2 IMPLANT
GOWN STRL REUS W/ TWL XL LVL3 (GOWN DISPOSABLE) ×1 IMPLANT
GOWN STRL REUS W/TWL LRG LVL3 (GOWN DISPOSABLE) ×4
GOWN STRL REUS W/TWL XL LVL3 (GOWN DISPOSABLE) ×2
KIT BASIN OR (CUSTOM PROCEDURE TRAY) ×2 IMPLANT
KIT ROOM TURNOVER OR (KITS) ×2 IMPLANT
MEDTRONIC ×1 IMPLANT
NDL HYPO 25X1 1.5 SAFETY (NEEDLE) IMPLANT
NDL SPNL 18GX3.5 QUINCKE PK (NEEDLE) ×2 IMPLANT
NEEDLE 22X1 1/2 (OR ONLY) (NEEDLE) IMPLANT
NEEDLE HYPO 25X1 1.5 SAFETY (NEEDLE) IMPLANT
NEEDLE SPNL 18GX3.5 QUINCKE PK (NEEDLE) ×4 IMPLANT
NS IRRIG 1000ML POUR BTL (IV SOLUTION) ×2 IMPLANT
PACK SURGICAL SETUP 50X90 (CUSTOM PROCEDURE TRAY) ×2 IMPLANT
PAD ARMBOARD 7.5X6 YLW CONV (MISCELLANEOUS) ×4 IMPLANT
POSITIONER HEAD PRONE TRACH (MISCELLANEOUS) ×2 IMPLANT
SPONGE GAUZE 4X4 12PLY STER LF (GAUZE/BANDAGES/DRESSINGS) ×1 IMPLANT
SUT MNCRL AB 4-0 PS2 18 (SUTURE) ×2 IMPLANT
SYR BULB IRRIGATION 50ML (SYRINGE) ×2 IMPLANT
SYR CONTROL 10ML LL (SYRINGE) ×2 IMPLANT
TOWEL OR 17X24 6PK STRL BLUE (TOWEL DISPOSABLE) ×2 IMPLANT
TOWEL OR 17X26 10 PK STRL BLUE (TOWEL DISPOSABLE) ×2 IMPLANT
TRAY KYPHOPAK 15/2 EXPRESS (KITS) ×1 IMPLANT
TRAY KYPHOPAK 15/3 ONESTEP 1ST (MISCELLANEOUS) IMPLANT

## 2014-06-05 NOTE — Transfer of Care (Signed)
Immediate Anesthesia Transfer of Care Note  Patient: Paula Booker  Procedure(s) Performed: Procedure(s) with comments: KYPHOPLASTY (N/A) - T7 kyphoplasty  Patient Location: PACU  Anesthesia Type:General  Level of Consciousness: awake, alert , oriented and patient cooperative  Airway & Oxygen Therapy: Patient Spontanous Breathing and Patient connected to face mask oxygen  Post-op Assessment: Report given to RN, Post -op Vital signs reviewed and stable and Patient moving all extremities X 4  Post vital signs: Reviewed and stable  Last Vitals:  Filed Vitals:   06/05/14 0826  BP: 125/80  Pulse: 76  Temp: 36.6 C  Resp: 18    Complications: No apparent anesthesia complications

## 2014-06-05 NOTE — Anesthesia Preprocedure Evaluation (Addendum)
Anesthesia Evaluation  Patient identified by MRN, date of birth, ID band Patient awake    Reviewed: Allergy & Precautions, NPO status , Patient's Chart, lab work & pertinent test results  History of Anesthesia Complications Negative for: history of anesthetic complications  Airway Mallampati: III  TM Distance: >3 FB Neck ROM: Full    Dental  (+) Partial Lower,    Pulmonary shortness of breath and with exertion, asthma , neg sleep apnea, neg recent URI,  breath sounds clear to auscultation        Cardiovascular hypertension, Pt. on medications and Pt. on home beta blockers - angina+CHF - CAD and - Past MI + pacemaker + Cardiac Defibrillator Rhythm:Regular     Neuro/Psych  Headaches, PSYCHIATRIC DISORDERS Anxiety Depression t7 compression fracutre    GI/Hepatic Neg liver ROS, GERD-  Medicated and Controlled,  Endo/Other  negative endocrine ROS  Renal/GU negative Renal ROS     Musculoskeletal  (+) Arthritis -,   Abdominal   Peds  Hematology negative hematology ROS (+)   Anesthesia Other Findings   Reproductive/Obstetrics                            Anesthesia Physical Anesthesia Plan  ASA: III  Anesthesia Plan: General   Post-op Pain Management:    Induction: Intravenous  Airway Management Planned: Oral ETT  Additional Equipment: None  Intra-op Plan:   Post-operative Plan: Extubation in OR  Informed Consent: I have reviewed the patients History and Physical, chart, labs and discussed the procedure including the risks, benefits and alternatives for the proposed anesthesia with the patient or authorized representative who has indicated his/her understanding and acceptance.   Dental advisory given  Plan Discussed with: CRNA and Surgeon  Anesthesia Plan Comments:         Anesthesia Quick Evaluation

## 2014-06-05 NOTE — Plan of Care (Signed)
Problem: Consults Goal: Diagnosis - Spinal Surgery Outcome: Completed/Met Date Met:  06/05/14 T7 KYPHOPLASTY

## 2014-06-06 ENCOUNTER — Encounter (HOSPITAL_COMMUNITY): Payer: Self-pay | Admitting: Orthopedic Surgery

## 2014-06-06 DIAGNOSIS — M4854XA Collapsed vertebra, not elsewhere classified, thoracic region, initial encounter for fracture: Secondary | ICD-10-CM | POA: Diagnosis not present

## 2014-06-06 NOTE — Anesthesia Postprocedure Evaluation (Signed)
  Anesthesia Post-op Note  Patient: Paula Booker  Procedure(s) Performed: Procedure(s) with comments: KYPHOPLASTY (N/A) - T7 kyphoplasty  Patient Location: PACU  Anesthesia Type:General  Level of Consciousness: awake  Airway and Oxygen Therapy: Patient Spontanous Breathing  Post-op Pain: none  Post-op Assessment: Post-op Vital signs reviewed, Patient's Cardiovascular Status Stable, Respiratory Function Stable, Patent Airway, No signs of Nausea or vomiting and Pain level controlled  Post-op Vital Signs: Reviewed and stable  Last Vitals:  Filed Vitals:   06/06/14 0815  BP: 141/66  Pulse: 88  Temp: 36.9 C  Resp: 20    Complications: No apparent anesthesia complications

## 2014-06-06 NOTE — Progress Notes (Signed)
Patient alert and oriented, mae's well, voiding adequate amount of urine, swallowing without difficulty, no c/o pain. Patient discharged home with family. Script and discharged instructions given to patient. Patient and family stated understanding of d/c instructions given and has an appointment with MD. Aisha Shakesha Soltau RN 

## 2014-06-06 NOTE — Op Note (Signed)
NAMEMARJI, Paula Booker NO.:  [id number]  MEDICAL RECORD NO.:  [id number]  LOCATION:  2C00L                        FACILITY:  [facility]  PHYSICIAN:  Phylliss Bob, MD      DATE OF BIRTH:  04-Apr-1946  DATE OF PROCEDURE:  06/05/2014                              OPERATIVE REPORT   PREOPERATIVE DIAGNOSIS:  T7 compression fracture.  POSTOPERATIVE DIAGNOSIS:  T7 compression fracture.  PROCEDURE:  T7 kyphoplasty.  SURGEON:  Phylliss Bob, MD  ASSISTANT:  Pricilla Holm, PA-C  ANESTHESIA:  General endotracheal anesthesia.  COMPLICATIONS:  None.  DISPOSITION:  Stable.  ESTIMATED BLOOD LOSS:  Minimal.  INDICATIONS FOR SURGERY:  Briefly, Paula Booker is a pleasant 69 year old female, who did suffer a fall in the bathroom.  A CAT scan did reveal a deformity of the T7 vertebral body.  Given the patient's ongoing pain, we did discuss a kyphoplasty procedure.  She did elect to proceed.  OPERATIVE DETAILS:  On June 05, 2014, the patient was brought to surgery and general endotracheal anesthesia was administered.  The patient was placed prone on a well-padded flat Jackson bed.  Gels were placed under the patient's chest and hips.  A time-out procedure was performed after prepping and draping the back in the usual fashion.  I then made 2 stab incisions just lateral to the T7 pedicles.  I did use Jamshidi needles, which were advanced across the T7 pedicles.  I did liberally use AP and lateral fluoroscopy to secure the Jamshidi needles in the appropriate position of the vertebral body.  I then introduced kyphoplasty balloons which were inflated bilaterally.  I was able to partially restore the height of the T7 endplate.  The kyphoplasty balloons were removed.  After approximately 5 minutes of mixing the cement, I did began to inject the cement into the vertebral body through the cannulas.  I did inject approximately 2 mL of cement on each side, for a total of approximately  4 mL.  The cement did interdigitate into the vertebral body appropriately.  There was no significant extravasation of cement either anteriorly or laterally or into the spinal canal posteriorly.  The cement was then allowed to harden.  The cannulas were then removed.  The stab incisions were closed with 3-0 Monocryl. Bacitracin was applied followed by sterile dressing.  All instrument counts were correct at the termination of the procedure.  Of note, Pricilla Holm was my assistant in surgery.     Phylliss Bob, MD     MD/MEDQ  D:  06/05/2014  T:  06/06/2014  Job:  [id number]  cc:   Antony Blackbird, MD

## 2014-06-24 ENCOUNTER — Ambulatory Visit (INDEPENDENT_AMBULATORY_CARE_PROVIDER_SITE_OTHER): Payer: Medicare Other | Admitting: *Deleted

## 2014-06-24 ENCOUNTER — Other Ambulatory Visit: Payer: Self-pay | Admitting: Internal Medicine

## 2014-06-24 DIAGNOSIS — I429 Cardiomyopathy, unspecified: Secondary | ICD-10-CM | POA: Diagnosis not present

## 2014-06-24 DIAGNOSIS — I428 Other cardiomyopathies: Secondary | ICD-10-CM

## 2014-06-24 DIAGNOSIS — I5022 Chronic systolic (congestive) heart failure: Secondary | ICD-10-CM

## 2014-06-25 ENCOUNTER — Other Ambulatory Visit (INDEPENDENT_AMBULATORY_CARE_PROVIDER_SITE_OTHER): Payer: Medicare Other | Admitting: *Deleted

## 2014-06-25 DIAGNOSIS — I429 Cardiomyopathy, unspecified: Secondary | ICD-10-CM | POA: Diagnosis not present

## 2014-06-25 DIAGNOSIS — I428 Other cardiomyopathies: Secondary | ICD-10-CM

## 2014-06-25 LAB — BASIC METABOLIC PANEL
BUN: 20 mg/dL (ref 6–23)
CHLORIDE: 104 meq/L (ref 96–112)
CO2: 30 mEq/L (ref 19–32)
Calcium: 9.4 mg/dL (ref 8.4–10.5)
Creatinine, Ser: 0.76 mg/dL (ref 0.40–1.20)
GFR: 80.36 mL/min (ref >60.00)
Glucose, Bld: 136 mg/dL — ABNORMAL HIGH (ref 70–99)
Potassium: 4.3 mEq/L (ref 3.5–5.1)
SODIUM: 137 meq/L (ref 135–145)

## 2014-06-25 NOTE — Progress Notes (Signed)
Remote ICD transmission.   

## 2014-06-25 NOTE — Addendum Note (Signed)
Addended by: Eulis Foster on: 06/25/2014 10:26 AM   Modules accepted: Orders

## 2014-06-26 LAB — MDC_IDC_ENUM_SESS_TYPE_REMOTE
Battery Voltage: 3.01 V
Brady Statistic AS VP Percent: 0 %
Brady Statistic RA Percent Paced: 0.04 %
HIGH POWER IMPEDANCE MEASURED VALUE: 86 Ohm
Lead Channel Impedance Value: 475 Ohm
Lead Channel Impedance Value: 817 Ohm
Lead Channel Pacing Threshold Amplitude: 0.5 V
Lead Channel Pacing Threshold Amplitude: 0.625 V
Lead Channel Pacing Threshold Pulse Width: 0.4 ms
Lead Channel Sensing Intrinsic Amplitude: 12.75 mV
Lead Channel Sensing Intrinsic Amplitude: 12.75 mV
Lead Channel Sensing Intrinsic Amplitude: 3.5 mV
Lead Channel Setting Pacing Amplitude: 2.5 V
Lead Channel Setting Pacing Pulse Width: 0.4 ms
MDC IDC MSMT BATTERY REMAINING LONGEVITY: 112 mo
MDC IDC MSMT LEADCHNL RA PACING THRESHOLD PULSEWIDTH: 0.4 ms
MDC IDC MSMT LEADCHNL RA SENSING INTR AMPL: 3.5 mV
MDC IDC MSMT LEADCHNL RV IMPEDANCE VALUE: 779 Ohm
MDC IDC SESS DTM: 20160315172046
MDC IDC SET LEADCHNL RA PACING AMPLITUDE: 2 V
MDC IDC SET LEADCHNL RV SENSING SENSITIVITY: 0.3 mV
MDC IDC SET ZONE DETECTION INTERVAL: 300 ms
MDC IDC STAT BRADY AP VP PERCENT: 0 %
MDC IDC STAT BRADY AP VS PERCENT: 0.04 %
MDC IDC STAT BRADY AS VS PERCENT: 99.96 %
MDC IDC STAT BRADY RV PERCENT PACED: 0 %
Zone Setting Detection Interval: 270 ms
Zone Setting Detection Interval: 350 ms
Zone Setting Detection Interval: 400 ms

## 2014-07-03 ENCOUNTER — Encounter: Payer: Self-pay | Admitting: Cardiology

## 2014-07-11 ENCOUNTER — Encounter: Payer: Self-pay | Admitting: Internal Medicine

## 2014-07-17 ENCOUNTER — Encounter: Payer: Self-pay | Admitting: Cardiology

## 2014-07-23 ENCOUNTER — Other Ambulatory Visit: Payer: Self-pay | Admitting: Interventional Cardiology

## 2014-08-13 ENCOUNTER — Other Ambulatory Visit: Payer: Self-pay | Admitting: Interventional Cardiology

## 2014-08-27 ENCOUNTER — Encounter (HOSPITAL_COMMUNITY): Payer: Self-pay | Admitting: *Deleted

## 2014-08-27 ENCOUNTER — Telehealth: Payer: Self-pay | Admitting: Interventional Cardiology

## 2014-08-27 ENCOUNTER — Emergency Department (HOSPITAL_COMMUNITY): Payer: Medicare Other

## 2014-08-27 ENCOUNTER — Emergency Department (HOSPITAL_COMMUNITY)
Admission: EM | Admit: 2014-08-27 | Discharge: 2014-08-27 | Disposition: A | Discharge status: Home / Self Care | Payer: Medicare Other | Attending: Emergency Medicine | Admitting: Emergency Medicine

## 2014-08-27 DIAGNOSIS — R0789 Other chest pain: Secondary | ICD-10-CM | POA: Diagnosis not present

## 2014-08-27 DIAGNOSIS — Z7951 Long term (current) use of inhaled steroids: Secondary | ICD-10-CM | POA: Diagnosis not present

## 2014-08-27 DIAGNOSIS — R112 Nausea with vomiting, unspecified: Secondary | ICD-10-CM | POA: Insufficient documentation

## 2014-08-27 DIAGNOSIS — Z880 Allergy status to penicillin: Secondary | ICD-10-CM | POA: Diagnosis not present

## 2014-08-27 DIAGNOSIS — Z9889 Other specified postprocedural states: Secondary | ICD-10-CM | POA: Insufficient documentation

## 2014-08-27 DIAGNOSIS — R61 Generalized hyperhidrosis: Secondary | ICD-10-CM | POA: Insufficient documentation

## 2014-08-27 DIAGNOSIS — F329 Major depressive disorder, single episode, unspecified: Secondary | ICD-10-CM | POA: Diagnosis not present

## 2014-08-27 DIAGNOSIS — I10 Essential (primary) hypertension: Secondary | ICD-10-CM | POA: Insufficient documentation

## 2014-08-27 DIAGNOSIS — Z95810 Presence of automatic (implantable) cardiac defibrillator: Secondary | ICD-10-CM | POA: Diagnosis not present

## 2014-08-27 DIAGNOSIS — J45901 Unspecified asthma with (acute) exacerbation: Secondary | ICD-10-CM | POA: Diagnosis not present

## 2014-08-27 DIAGNOSIS — E669 Obesity, unspecified: Secondary | ICD-10-CM | POA: Insufficient documentation

## 2014-08-27 DIAGNOSIS — Z85828 Personal history of other malignant neoplasm of skin: Secondary | ICD-10-CM | POA: Diagnosis not present

## 2014-08-27 DIAGNOSIS — M1990 Unspecified osteoarthritis, unspecified site: Secondary | ICD-10-CM | POA: Insufficient documentation

## 2014-08-27 DIAGNOSIS — F419 Anxiety disorder, unspecified: Secondary | ICD-10-CM | POA: Diagnosis not present

## 2014-08-27 DIAGNOSIS — R079 Chest pain, unspecified: Secondary | ICD-10-CM | POA: Diagnosis present

## 2014-08-27 DIAGNOSIS — I509 Heart failure, unspecified: Secondary | ICD-10-CM | POA: Diagnosis not present

## 2014-08-27 DIAGNOSIS — Z8701 Personal history of pneumonia (recurrent): Secondary | ICD-10-CM | POA: Insufficient documentation

## 2014-08-27 DIAGNOSIS — Z87828 Personal history of other (healed) physical injury and trauma: Secondary | ICD-10-CM | POA: Diagnosis not present

## 2014-08-27 DIAGNOSIS — Z8669 Personal history of other diseases of the nervous system and sense organs: Secondary | ICD-10-CM | POA: Diagnosis not present

## 2014-08-27 DIAGNOSIS — Z79899 Other long term (current) drug therapy: Secondary | ICD-10-CM | POA: Diagnosis not present

## 2014-08-27 DIAGNOSIS — K219 Gastro-esophageal reflux disease without esophagitis: Secondary | ICD-10-CM | POA: Diagnosis not present

## 2014-08-27 LAB — BRAIN NATRIURETIC PEPTIDE: B Natriuretic Peptide: 48.5 pg/mL (ref 0.0–100.0)

## 2014-08-27 LAB — I-STAT TROPONIN, ED
TROPONIN I, POC: 0 ng/mL (ref 0.00–0.08)
Troponin i, poc: 0 ng/mL (ref 0.00–0.08)

## 2014-08-27 LAB — CBC
HCT: 42.3 % (ref 36.0–46.0)
Hemoglobin: 14.3 g/dL (ref 12.0–15.0)
MCH: 30.7 pg (ref 26.0–34.0)
MCHC: 33.8 g/dL (ref 30.0–36.0)
MCV: 90.8 fL (ref 78.0–100.0)
Platelets: 266 10*3/uL (ref 150–400)
RBC: 4.66 MIL/uL (ref 3.87–5.11)
RDW: 12.6 % (ref 11.5–15.5)
WBC: 8.7 10*3/uL (ref 4.0–10.5)

## 2014-08-27 LAB — BASIC METABOLIC PANEL
Anion gap: 11 (ref 5–15)
BUN: 24 mg/dL — AB (ref 6–20)
CALCIUM: 9.8 mg/dL (ref 8.9–10.3)
CO2: 24 mmol/L (ref 22–32)
Chloride: 103 mmol/L (ref 101–111)
Creatinine, Ser: 0.87 mg/dL (ref 0.44–1.00)
Glucose, Bld: 126 mg/dL — ABNORMAL HIGH (ref 65–99)
Potassium: 4.5 mmol/L (ref 3.5–5.1)
Sodium: 138 mmol/L (ref 135–145)

## 2014-08-27 LAB — PROTIME-INR
INR: 1 (ref 0.00–1.49)
Prothrombin Time: 13.3 seconds (ref 11.6–15.2)

## 2014-08-27 LAB — APTT: APTT: 28 s (ref 24–37)

## 2014-08-27 MED ORDER — HYDROCODONE-ACETAMINOPHEN 5-325 MG PO TABS: 1.0000 | ORAL_TABLET | Freq: Four times a day (QID) | ORAL | Status: DC | PRN

## 2014-08-27 MED ORDER — ASPIRIN 81 MG PO CHEW: 324.0000 mg | CHEWABLE_TABLET | Freq: Once | ORAL | Status: DC

## 2014-08-27 MED ORDER — SODIUM CHLORIDE 0.9 % IV SOLN
INTRAVENOUS | Status: DC
  Administered 2014-08-27: 20 mL/h via INTRAVENOUS

## 2014-08-27 MED ORDER — ASPIRIN 325 MG PO TABS: 325.0000 mg | ORAL_TABLET | ORAL | Status: DC

## 2014-08-27 MED ORDER — ASPIRIN 81 MG PO CHEW
324.0000 mg | CHEWABLE_TABLET | Freq: Once | ORAL | Status: AC
  Administered 2014-08-27: 324 mg via ORAL
  Filled 2014-08-27: qty 4

## 2014-08-27 MED ORDER — MORPHINE SULFATE 4 MG/ML IJ SOLN
4.0000 mg | Freq: Once | INTRAMUSCULAR | Status: AC
  Administered 2014-08-27: 4 mg via INTRAVENOUS
  Filled 2014-08-27: qty 1

## 2014-08-27 MED ORDER — HEPARIN SODIUM (PORCINE) 5000 UNIT/ML IJ SOLN
4000.0000 [IU] | INTRAMUSCULAR | Status: AC
  Administered 2014-08-27: 4000 [IU] via INTRAVENOUS
  Filled 2014-08-27: qty 1

## 2014-08-27 MED ORDER — NITROGLYCERIN 0.4 MG SL SUBL: 0.4000 mg | SUBLINGUAL_TABLET | SUBLINGUAL | Status: DC | PRN

## 2014-08-27 MED ORDER — FENTANYL CITRATE (PF) 100 MCG/2ML IJ SOLN
50.0000 ug | Freq: Once | INTRAMUSCULAR | Status: AC
  Administered 2014-08-27: 50 ug via INTRAVENOUS
  Filled 2014-08-27: qty 2

## 2014-08-27 NOTE — Telephone Encounter (Deleted)
New Message  Pt called states that she is having servere chest pains. Went to the Hospital. They ran an EKG and said

## 2014-08-27 NOTE — ED Notes (Signed)
Pt. States chest pain started last night at 11pm. Pt. Thought it was indigestion and took some home meds for GERD with no relief.

## 2014-08-27 NOTE — Discharge Instructions (Signed)
Take the hydrocodone for pain. I spoke to the cardiology PA this morning and you should get a call from the office to be seen soon.  Return to the ED if you get worse such as fever, cough, struggling to breathe or worsening pain.

## 2014-08-27 NOTE — ED Provider Notes (Signed)
CSN: [id number]     Arrival date & time 08/27/14  0207 History  This chart was scribed for Rolland Porter, MD by Pricilla Loveless, ED Scribe. This patient was seen in room A07C/A07C and the patient's care was started at 2:25 AM.     Chief Complaint  Patient presents with  . Chest Pain    The history is provided by the patient. No language interpreter was used.     HPI Comments: Paula Booker is a 69 y.o. female with Hx of ICD, WPW, CHF who presents to the Emergency Department complaining of constant, severe, pressure-like middle chest pain radiating to her back that began at 11 pm tonight. Patient reports the pain woke her from sleep. She lists nausea, vomiting, SOB, and diaphoresis as associated symptoms. She has taken Tums without significant relief. She reports a 3% blockage on her cardiac cath in 2013. She denies fever as an associated symptom. She states she's never had this pain before. The patient claims that she does not smoke or drink  PCP Dr [facility] Dr Irish Lack  ICD Dr Lovena Le  Past Medical History  Diagnosis Date  . Asthma   . GERD (gastroesophageal reflux disease)   . Hypertension   . ICD (implantable cardiac defibrillator) in place Dec 2013  . Shortness of breath     "@ any time I can get SOB" (03/22/2012)  . Migraines   . Skin cancer 1999  . WPW (Wolff-Parkinson-White syndrome)   . Pacemaker   . CHF (congestive heart failure) 6/13  . Pneumonia     hx  . Anxiety   . NICM (nonischemic cardiomyopathy)   . Obesity (BMI 30-39.9)     negative sleep study 2014  . Head injury, acute, with loss of consciousness   . Depression   . Arthritis     "hands and knees" (03/22/2012)   Past Surgical History  Procedure Laterality Date  . Cardiac defibrillator placement  03/22/2012    DDD  . Cholecystectomy  ~ 2003  . Tonsillectomy and adenoidectomy  1950  . Skin cancer excision  1999    "top of my head and my right back shoulder"  . Mohs surgery  06/2011    "forehead"  (03/22/2012)  . Supraventricular tachycardia ablation  03/22/2012    unable to induce VT/RN report 03/22/2012  . Tubal ligation  1978  . Kyphoplasty N/A 11/27/2013    Procedure: KYPHOPLASTY;  Surgeon: Sinclair Ship, MD;  Location: [facility];  Service: Orthopedics;  Laterality: N/A;  T9, T11 kyphoplasty  . Cardiac catheterization  09/18/11    no significant CAD  . Supraventricular tachycardia ablation N/A 03/22/2012    Procedure: SUPRAVENTRICULAR TACHYCARDIA ABLATION;  Surgeon: Evans Lance, MD;  Location: [facility] CATH LAB;  Service: Cardiovascular;  Laterality: N/A;  . Implantable cardioverter defibrillator implant N/A 03/22/2012    Procedure: IMPLANTABLE CARDIOVERTER DEFIBRILLATOR IMPLANT;  Surgeon: Evans Lance, MD;  Location: [facility] CATH LAB;  Service: Cardiovascular;  Laterality: N/A;  . Skin cancer excision      back  . Skin cancer excision      face  . Kyphoplasty N/A 06/05/2014    Procedure: KYPHOPLASTY;  Surgeon: Sinclair Ship, MD;  Location: [facility];  Service: Orthopedics;  Laterality: N/A;  T7 kyphoplasty   Family History  Problem Relation Age of Onset  . Tuberculosis Paternal Grandfather   . Congestive Heart Failure Brother   . Congestive Heart Failure Mother     died 39   History  Substance Use Topics  . Smoking status: Never Smoker   . Smokeless tobacco: Never Used  . Alcohol Use: No  lives at home Lives alone   [place] History    No data available     Review of Systems  Constitutional: Positive for diaphoresis. Negative for fever.  Respiratory: Positive for shortness of breath.   Cardiovascular: Positive for chest pain.  Gastrointestinal: Positive for nausea and vomiting.  All other systems reviewed and are negative.     Allergies  Ivp dye; Penicillins; Sulfonamide derivatives; and Onion  Home Medications   Prior to Admission medications   Medication Sig Start Date End Date Taking? Authorizing Provider  albuterol (PROVENTIL HFA;VENTOLIN HFA) 108 (90  BASE) MCG/ACT inhaler Inhale 2 puffs into the lungs every 6 (six) hours as needed. For wheezing   Yes Historical Provider, MD  alendronate (FOSAMAX) 70 MG tablet Take 70 mg by mouth once a week. Take with a full glass of water on an empty stomach.   Yes Historical Provider, MD  carvedilol (COREG) 12.5 MG tablet Take 1 tablet (12.5 mg total) by mouth 2 (two) times daily with a meal. 03/27/14  Yes Jettie Booze, MD  cetirizine (ZYRTEC) 10 MG tablet Take 10 mg by mouth daily.   Yes Historical Provider, MD  Cholecalciferol (VITAMIN D3) 1000 UNITS CAPS Take 1,000 Units by mouth daily.    Yes Historical Provider, MD  citalopram (CELEXA) 20 MG tablet Take 20 mg by mouth daily.   Yes Historical Provider, MD  fluticasone (FLONASE) 50 MCG/ACT nasal spray Place 1 spray into both nostrils daily. 01/22/14  Yes Historical Provider, MD  furosemide (LASIX) 40 MG tablet TAKE ONE TABLET BY MOUTH ONCE DAILY 08/13/14  Yes Jettie Booze, MD  gelatin 650 MG capsule Take 1,300 mg by mouth daily.   Yes Historical Provider, MD  losartan (COZAAR) 50 MG tablet TAKE ONE TABLET BY MOUTH ONCE DAILY 07/24/14  Yes Jettie Booze, MD  Misc Natural Products (OSTEO BI-FLEX ADV DOUBLE ST PO) Take 2 tablets by mouth every morning.    Yes Historical Provider, MD  Multiple Vitamins-Minerals (MULTIVITAMIN PO) Take 1 tablet by mouth daily. ALIVE   Yes Historical Provider, MD  naproxen sodium (ANAPROX) 220 MG tablet Take 440 mg by mouth 2 (two) times daily with a meal.   Yes Historical Provider, MD  omeprazole (PRILOSEC) 20 MG capsule Take 20 mg by mouth daily.    Yes Historical Provider, MD  potassium chloride (K-DUR,KLOR-CON) 10 MEQ tablet Take 10 mEq by mouth 2 (two) times daily.   Yes Historical Provider, MD  spironolactone (ALDACTONE) 25 MG tablet Take 1 tablet (25 mg total) by mouth daily. 03/27/14  Yes Jettie Booze, MD  HYDROcodone-acetaminophen (NORCO/VICODIN) 5-325 MG per tablet Take 1 tablet by mouth every 6  (six) hours as needed for moderate pain. 08/27/14   Rolland Porter, MD   BP 108/78 mmHg  Pulse 79  Temp(Src) 97.4 F (36.3 C) (Oral)  Resp 16  SpO2 97%  Vital signs normal   Physical Exam  Constitutional: She is oriented to person, place, and time. She appears well-developed and well-nourished.  Non-toxic appearance. She does not appear ill. No distress.  HENT:  Head: Normocephalic and atraumatic.  Right Ear: External ear normal.  Left Ear: External ear normal.  Nose: Nose normal. No mucosal edema or rhinorrhea.  Mouth/Throat: Oropharynx is clear and moist and mucous membranes are normal. No dental abscesses or uvula swelling.  Eyes: Conjunctivae and EOM  are normal. Pupils are equal, round, and reactive to light.  Neck: Normal range of motion and full passive range of motion without pain. Neck supple.  Cardiovascular: Normal rate, regular rhythm and normal heart sounds.  Exam reveals no gallop and no friction rub.   No murmur heard. Pulmonary/Chest: Effort normal and breath sounds normal. No respiratory distress. She has no wheezes. She has no rhonchi. She has no rales. She exhibits no tenderness and no crepitus.  Abdominal: Soft. Normal appearance and bowel sounds are normal. She exhibits no distension. There is no tenderness. There is no rebound and no guarding.  Musculoskeletal: Normal range of motion. She exhibits no edema or tenderness.  Moves all extremities well.   Neurological: She is alert and oriented to person, place, and time. She has normal strength. No cranial nerve deficit.  Skin: Skin is warm, dry and intact. No rash noted. No erythema. There is pallor.  Psychiatric: She has a normal mood and affect. Her speech is normal and behavior is normal. Her mood appears not anxious.  Nursing note and vitals reviewed.   ED Course  Procedures   Medications  nitroGLYCERIN (NITROSTAT) SL tablet 0.4 mg (not administered)  0.9 %  sodium chloride infusion ( Intravenous Stopped 08/27/14  0710)  aspirin chewable tablet 324 mg (324 mg Oral Given 08/27/14 0241)  heparin injection 4,000 Units (4,000 Units Intravenous Given 08/27/14 0241)  morphine 4 MG/ML injection 4 mg (4 mg Intravenous Given 08/27/14 0243)  fentaNYL (SUBLIMAZE) injection 50 mcg (50 mcg Intravenous Given 08/27/14 0337)   DIAGNOSTIC STUDIES: Oxygen Saturation is 97% on RA, Normal by my interpretation.    COORDINATION OF CARE: 2:34 AM Discussed treatment plan at bedside. Pt agreed to plan.   Review of patient's chart shows patient was evaluated in October when she presented with central chest pain that radiated into her back. She had negative troponins and a negative CTA of her chest. When I ask her about it she now remembers having that pain. On reviewing her cardiology notes it's noted several times she's been under a lot of stress. Patient states she moved into a new house/tear that needs a lot of remodeling including making a new bathroom. She reports she is doing a lot of remodeling herself including painting. However she states she's not under stress now. Patient reports she has fallen off a ladder and had a thoracic spine fracture that was treated by Dr. Lynann Bologna and states she has not had pain since he did kyphoplasty.   02:54 Dr Chrisandra Carota, Cardiology Fellow has reviewed her EKGs and her prior cardiology notes. He feels that the EKG changes seen today are reflective of her left bundle branch block. He suggested doing delta troponins and discharge her to follow-up with her cardiologist.  06:50 PA Cecilie Kicks, Cardiology, will have office get her a follow up appointment  Review of the [facility] shows patient got #20 oxycodone 5/325 on March 5, and #60 hydrocodone 5/325 on 2/8 and #30 Valium 5 mg tablets on March 5.   Labs Review Results for orders placed or performed during the hospital encounter of 08/27/14  CBC  Result Value Ref Range   WBC 8.7 4.0 - 10.5 K/uL   RBC 4.66 3.87 - 5.11 MIL/uL    Hemoglobin 14.3 12.0 - 15.0 g/dL   HCT 42.3 36.0 - 46.0 %   MCV 90.8 78.0 - 100.0 fL   MCH 30.7 26.0 - 34.0 pg   MCHC 33.8 30.0 - 36.0 g/dL  RDW 12.6 11.5 - 15.5 %   Platelets 266 150 - 400 K/uL  Basic metabolic panel  Result Value Ref Range   Sodium 138 135 - 145 mmol/L   Potassium 4.5 3.5 - 5.1 mmol/L   Chloride 103 101 - 111 mmol/L   CO2 24 22 - 32 mmol/L   Glucose, Bld 126 (H) 65 - 99 mg/dL   BUN 24 (H) 6 - 20 mg/dL   Creatinine, Ser 0.87 0.44 - 1.00 mg/dL   Calcium 9.8 8.9 - 10.3 mg/dL   GFR calc non Af Amer >60 >60 mL/min   GFR calc Af Amer >60 >60 mL/min   Anion gap 11 5 - 15  BNP (order ONLY if patient complains of dyspnea/SOB AND you have documented it for THIS visit)  Result Value Ref Range   B Natriuretic Peptide 48.5 0.0 - 100.0 pg/mL  APTT  Result Value Ref Range   aPTT 28 24 - 37 seconds  Protime-INR  Result Value Ref Range   Prothrombin Time 13.3 11.6 - 15.2 seconds   INR 1.00 0.00 - 1.49  I-stat troponin, ED  (not at [facility], [facility])  Result Value Ref Range   Troponin i, poc 0.00 0.00 - 0.08 ng/mL   Comment 3          I-Stat Troponin, ED (not at [facility], [facility])  Result Value Ref Range   Troponin i, poc 0.00 0.00 - 0.08 ng/mL   Comment 3           Laboratory interpretation all normal   Imaging Review Dg Chest Port 1 View  08/27/2014   CLINICAL DATA:  Initial evaluation for acute chest pain, nausea, vomiting.  EXAM: PORTABLE CHEST - 1 VIEW  COMPARISON:  Prior study from 05/22/2014 and 01/28/2015  FINDINGS: Dual lead left-sided transvenous pacemaker/ AICD in place with electrodes overlying the right atrium and right ventricle. Cardiomegaly is stable. Mediastinal silhouette within normal limits.  Lungs are normally inflated. No focal infiltrate, pulmonary edema, or pleural effusion. No pneumothorax. Left costophrenic angle incompletely visualized.  No acute osseus abnormality. Sequelae of prior vertebral augmentation noted.  IMPRESSION: 1. No active cardiopulmonary  disease identified. 2. Stable cardiomegaly with left-sided transvenous pacemaker/AICD.   Electronically Signed   By: Jeannine Boga M.D.   On: 08/27/2014 02:40     EKG Interpretation   Date/Time:  Wednesday Aug 27 2014 02:12:25 EDT Ventricular Rate:  92 PR Interval:  146 QRS Duration: 142 QT Interval:  436 QTC Calculation: 539 R Axis:   32 Text Interpretation:  Normal sinus rhythm Non-specific intra-ventricular  conduction block Cannot rule out Anterior infarct , age undetermined Since  last tracing 27 Jan 2014 T wave abnormality, consider inferior ischemia  Confirmed by Jalene Lacko  MD-I, Lisl Slingerland ([id number]) on 08/27/2014 2:13:46 AM      MDM   patient has documented normal coronary arteries presents with acute chest pain radiating into her back that she actually had several months ago with negative workup. She was discharged with pain medication and to follow-up with her cardiologist.    Final diagnoses:  Atypical chest pain    Discharge Medication List as of 08/27/2014  6:55 AM    START taking these medications   Details  HYDROcodone-acetaminophen (NORCO/VICODIN) 5-325 MG per tablet Take 1 tablet by mouth every 6 (six) hours as needed for moderate pain., Starting 08/27/2014, Until Discontinued, Print        Plan discharge    I personally performed the services described in this  documentation, which was scribed in my presence. The recorded information has been reviewed and considered.  Rolland Porter, MD, Barbette Or, MD 08/27/14 [phone]

## 2014-09-03 NOTE — Telephone Encounter (Signed)
ERROR

## 2014-09-03 NOTE — Telephone Encounter (Deleted)
ERROR

## 2014-09-21 ENCOUNTER — Emergency Department (HOSPITAL_COMMUNITY): Payer: Medicare Other

## 2014-09-21 ENCOUNTER — Emergency Department (HOSPITAL_COMMUNITY)
Admission: EM | Admit: 2014-09-21 | Discharge: 2014-09-21 | Disposition: A | Discharge status: Home / Self Care | Payer: Medicare Other | Attending: Emergency Medicine | Admitting: Emergency Medicine

## 2014-09-21 ENCOUNTER — Encounter (HOSPITAL_COMMUNITY): Payer: Self-pay | Admitting: *Deleted

## 2014-09-21 DIAGNOSIS — Y998 Other external cause status: Secondary | ICD-10-CM | POA: Diagnosis not present

## 2014-09-21 DIAGNOSIS — Z9889 Other specified postprocedural states: Secondary | ICD-10-CM | POA: Insufficient documentation

## 2014-09-21 DIAGNOSIS — Z95810 Presence of automatic (implantable) cardiac defibrillator: Secondary | ICD-10-CM | POA: Insufficient documentation

## 2014-09-21 DIAGNOSIS — Y9289 Other specified places as the place of occurrence of the external cause: Secondary | ICD-10-CM | POA: Insufficient documentation

## 2014-09-21 DIAGNOSIS — Y9389 Activity, other specified: Secondary | ICD-10-CM | POA: Insufficient documentation

## 2014-09-21 DIAGNOSIS — Z85828 Personal history of other malignant neoplasm of skin: Secondary | ICD-10-CM | POA: Diagnosis not present

## 2014-09-21 DIAGNOSIS — S91331A Puncture wound without foreign body, right foot, initial encounter: Secondary | ICD-10-CM | POA: Insufficient documentation

## 2014-09-21 DIAGNOSIS — I509 Heart failure, unspecified: Secondary | ICD-10-CM | POA: Diagnosis not present

## 2014-09-21 DIAGNOSIS — J45909 Unspecified asthma, uncomplicated: Secondary | ICD-10-CM | POA: Insufficient documentation

## 2014-09-21 DIAGNOSIS — S91311A Laceration without foreign body, right foot, initial encounter: Secondary | ICD-10-CM | POA: Diagnosis present

## 2014-09-21 DIAGNOSIS — Z880 Allergy status to penicillin: Secondary | ICD-10-CM | POA: Diagnosis not present

## 2014-09-21 DIAGNOSIS — W25XXXA Contact with sharp glass, initial encounter: Secondary | ICD-10-CM | POA: Insufficient documentation

## 2014-09-21 DIAGNOSIS — Z8701 Personal history of pneumonia (recurrent): Secondary | ICD-10-CM | POA: Insufficient documentation

## 2014-09-21 DIAGNOSIS — Z791 Long term (current) use of non-steroidal anti-inflammatories (NSAID): Secondary | ICD-10-CM | POA: Diagnosis not present

## 2014-09-21 DIAGNOSIS — E669 Obesity, unspecified: Secondary | ICD-10-CM | POA: Insufficient documentation

## 2014-09-21 DIAGNOSIS — T148XXA Other injury of unspecified body region, initial encounter: Secondary | ICD-10-CM

## 2014-09-21 DIAGNOSIS — Z79899 Other long term (current) drug therapy: Secondary | ICD-10-CM | POA: Insufficient documentation

## 2014-09-21 DIAGNOSIS — Z7951 Long term (current) use of inhaled steroids: Secondary | ICD-10-CM | POA: Insufficient documentation

## 2014-09-21 DIAGNOSIS — M159 Polyosteoarthritis, unspecified: Secondary | ICD-10-CM | POA: Insufficient documentation

## 2014-09-21 DIAGNOSIS — I10 Essential (primary) hypertension: Secondary | ICD-10-CM | POA: Diagnosis not present

## 2014-09-21 DIAGNOSIS — F419 Anxiety disorder, unspecified: Secondary | ICD-10-CM | POA: Diagnosis not present

## 2014-09-21 DIAGNOSIS — K219 Gastro-esophageal reflux disease without esophagitis: Secondary | ICD-10-CM | POA: Insufficient documentation

## 2014-09-21 DIAGNOSIS — F329 Major depressive disorder, single episode, unspecified: Secondary | ICD-10-CM | POA: Diagnosis not present

## 2014-09-21 NOTE — ED Provider Notes (Signed)
CSN: [id number]     Arrival date & time 09/21/14  1712 History   None   This chart was scribed for non-physician practitioner, Jamse Mead, [place], working with No att. providers found by Terressa Koyanagi, ED Scribe. This patient was seen in room TR11C/TR11C and the patient's care was started at 8:41 PM.  Chief Complaint  Patient presents with  . Laceration   The history is provided by the patient. No language interpreter was used.   PCP: Antony Blackbird, MD HPI Comments: Paula Booker is a 69 y.o. female, with PMHx asthma, GERD, HTN, ICD, SOB, migraines, skin cancer, WPW, pacemaker placement, CHF, anxiety, NICM, depression, and arthritis to the hands and knees, who presents to the Emergency Department complaining of a laceration with associated tenderness to touch to the bottom of her right foot onset today after pt, while barefoot, stepped on a piece of broken glass at home. Pt reports she was able to pull out the piece of glass from her foot immediately following the accident. Pt reports while there was a lot of bleeding immediately following the accident, the bleeding stopped before she arrived to the ED. Pt reports she is UTD on her tetanus vaccine, noting her last tetanus shot was within the past 5 years. Pt denies being on blood thinners, numbness, complete loss of sensation, head trauma, fall, LOC, or any other Sx at this time.  PCP Dr. Chapman Fitch  Past Medical History  Diagnosis Date  . Asthma   . GERD (gastroesophageal reflux disease)   . Hypertension   . ICD (implantable cardiac defibrillator) in place Dec 2013  . Shortness of breath     "@ any time I can get SOB" (03/22/2012)  . Migraines   . Skin cancer 1999  . WPW (Wolff-Parkinson-White syndrome)   . Pacemaker   . CHF (congestive heart failure) 6/13  . Pneumonia     hx  . Anxiety   . NICM (nonischemic cardiomyopathy)   . Obesity (BMI 30-39.9)     negative sleep study 2014  . Head injury, acute, with loss of consciousness   .  Depression   . Arthritis     "hands and knees" (03/22/2012)   Past Surgical History  Procedure Laterality Date  . Cardiac defibrillator placement  03/22/2012    DDD  . Cholecystectomy  ~ 2003  . Tonsillectomy and adenoidectomy  1950  . Skin cancer excision  1999    "top of my head and my right back shoulder"  . Mohs surgery  06/2011    "forehead" (03/22/2012)  . Supraventricular tachycardia ablation  03/22/2012    unable to induce VT/RN report 03/22/2012  . Tubal ligation  1978  . Kyphoplasty N/A 11/27/2013    Procedure: KYPHOPLASTY;  Surgeon: Sinclair Ship, MD;  Location: [place];  Service: Orthopedics;  Laterality: N/A;  T9, T11 kyphoplasty  . Cardiac catheterization  09/18/11    no significant CAD  . Supraventricular tachycardia ablation N/A 03/22/2012    Procedure: SUPRAVENTRICULAR TACHYCARDIA ABLATION;  Surgeon: Evans Lance, MD;  Location: [facility] CATH LAB;  Service: Cardiovascular;  Laterality: N/A;  . Implantable cardioverter defibrillator implant N/A 03/22/2012    Procedure: IMPLANTABLE CARDIOVERTER DEFIBRILLATOR IMPLANT;  Surgeon: Evans Lance, MD;  Location: [facility] CATH LAB;  Service: Cardiovascular;  Laterality: N/A;  . Skin cancer excision      back  . Skin cancer excision      face  . Kyphoplasty N/A 06/05/2014    Procedure: KYPHOPLASTY;  Surgeon:  Sinclair Ship, MD;  Location: [place];  Service: Orthopedics;  Laterality: N/A;  T7 kyphoplasty   Family History  Problem Relation Age of Onset  . Tuberculosis Paternal Grandfather   . Congestive Heart Failure Brother   . Congestive Heart Failure Mother     died 53   History  Substance Use Topics  . Smoking status: Never Smoker   . Smokeless tobacco: Never Used  . Alcohol Use: No   OB History    No data available     Review of Systems  Musculoskeletal:       Tenderness to touch to the right foot at point of laceration   Skin: Positive for wound (laceration to right foot ).  Neurological: Negative for  numbness.  Hematological: Does not bruise/bleed easily.   Allergies  Ivp dye; Penicillins; Sulfonamide derivatives; and Onion  Home Medications   Prior to Admission medications   Medication Sig Start Date End Date Taking? Authorizing Provider  albuterol (PROVENTIL HFA;VENTOLIN HFA) 108 (90 BASE) MCG/ACT inhaler Inhale 2 puffs into the lungs every 6 (six) hours as needed. For wheezing    Historical Provider, MD  alendronate (FOSAMAX) 70 MG tablet Take 70 mg by mouth once a week. Take with a full glass of water on an empty stomach.    Historical Provider, MD  carvedilol (COREG) 12.5 MG tablet Take 1 tablet (12.5 mg total) by mouth 2 (two) times daily with a meal. 03/27/14   Jettie Booze, MD  cetirizine (ZYRTEC) 10 MG tablet Take 10 mg by mouth daily.    Historical Provider, MD  Cholecalciferol (VITAMIN D3) 1000 UNITS CAPS Take 1,000 Units by mouth daily.     Historical Provider, MD  citalopram (CELEXA) 20 MG tablet Take 20 mg by mouth daily.    Historical Provider, MD  fluticasone (FLONASE) 50 MCG/ACT nasal spray Place 1 spray into both nostrils daily. 01/22/14   Historical Provider, MD  furosemide (LASIX) 40 MG tablet TAKE ONE TABLET BY MOUTH ONCE DAILY 08/13/14   Jettie Booze, MD  gelatin 650 MG capsule Take 1,300 mg by mouth daily.    Historical Provider, MD  HYDROcodone-acetaminophen (NORCO/VICODIN) 5-325 MG per tablet Take 1 tablet by mouth every 6 (six) hours as needed for moderate pain. 08/27/14   Rolland Porter, MD  losartan (COZAAR) 50 MG tablet TAKE ONE TABLET BY MOUTH ONCE DAILY 07/24/14   Jettie Booze, MD  Misc Natural Products (OSTEO BI-FLEX ADV DOUBLE ST PO) Take 2 tablets by mouth every morning.     Historical Provider, MD  Multiple Vitamins-Minerals (MULTIVITAMIN PO) Take 1 tablet by mouth daily. ALIVE    Historical Provider, MD  naproxen sodium (ANAPROX) 220 MG tablet Take 440 mg by mouth 2 (two) times daily with a meal.    Historical Provider, MD  omeprazole  (PRILOSEC) 20 MG capsule Take 20 mg by mouth daily.     Historical Provider, MD  potassium chloride (K-DUR,KLOR-CON) 10 MEQ tablet Take 10 mEq by mouth 2 (two) times daily.    Historical Provider, MD  spironolactone (ALDACTONE) 25 MG tablet Take 1 tablet (25 mg total) by mouth daily. 03/27/14   Jettie Booze, MD   Triage Vitals: BP 137/75 mmHg  Pulse 81  Temp(Src) 97.8 F (36.6 C)  Resp 18  Ht 5' 3.5" (1.613 m)  Wt 203 lb (92.08 kg)  BMI 35.39 kg/m2  SpO2 97% Physical Exam  Constitutional: She is oriented to person, place, and time. She appears well-developed  and well-nourished. No distress.  HENT:  Head: Normocephalic and atraumatic.  Eyes: Conjunctivae and EOM are normal. Right eye exhibits no discharge. Left eye exhibits no discharge.  Neck: Normal range of motion. Neck supple.  Cardiovascular: Normal rate, regular rhythm and normal heart sounds.  Exam reveals no friction rub.   No murmur heard. Pulses:      Radial pulses are 2+ on the right side, and 2+ on the left side.       Dorsalis pedis pulses are 2+ on the right side, and 2+ on the left side.  Pulmonary/Chest: Effort normal and breath sounds normal. No respiratory distress. She has no wheezes. She has no rales.  Musculoskeletal: Normal range of motion.  Full range of motion to lower extremities bilaterally. Patient is able to wiggle toes and has full range of motion to the ankles bilaterally without difficulty or ataxia.  Neurological: She is alert and oriented to person, place, and time. No cranial nerve deficit. She exhibits normal muscle tone. Coordination normal.  Cranial nerves III-XII grossly intact Strength 5+/5+ to lower extremities bilaterally with resistance applied, equal distribution noted Sensation intact with differentiation to sharp and dull touch Gait proper, proper balance - negative sway, negative drift, negative step-offs  Skin: Skin is warm and dry. She is not diaphoretic.  Puncture wound  identified to the plantar aspect of the right foot. Negative active bleeding or drainage noted at this time. Tenderness upon palpation. Negative palpation of foreign object.  Psychiatric: She has a normal mood and affect. Her behavior is normal. Thought content normal.  Nursing note and vitals reviewed.   ED Course  Procedures (including critical care time) DIAGNOSTIC STUDIES: Oxygen Saturation is 97% on RA, nl by my interpretation.    COORDINATION OF CARE: 8:45 PM-Discussed treatment plan which includes keeping the affected area clean, antibiotics, post op shoe, f/u with PCP within 24-48 hours, with pt at bedside and pt agreed to plan.   Labs Review Labs Reviewed - No data to display  Imaging Review Dg Foot Complete Right  09/21/2014   CLINICAL DATA:  Stepped on glass with continued bleeding. Acute foot pain. Initial encounter.  EXAM: RIGHT FOOT COMPLETE - 3+ VIEW  COMPARISON:  None.  FINDINGS: There is no evidence of acute fracture, subluxation or dislocation.  No radiopaque foreign bodies are identified.  A small calcaneal spur is present.  No focal bony lesions are otherwise noted.  IMPRESSION: No evidence of acute bony abnormality or radiopaque foreign body.   Electronically Signed   By: Margarette Canada M.D.   On: 09/21/2014 19:32     EKG Interpretation None      9:11 PM Discussed case with Dr. Aline Brochure, reported that patient does not need antibiotics for puncture wound.  MDM   Final diagnoses:  Puncture wound    Medications - No data to display  Filed Vitals:   09/21/14 1717 09/21/14 2150  BP: 137/75 129/78  Pulse: 81 84  Temp: 97.8 F (36.6 C)   Resp: 18 18  Height: 5' 3.5" (1.613 m)   Weight: 203 lb (92.08 kg)   SpO2: 97% 100%   Plain film of right foot no evidence of acute bony abnormality or radiopaque foreign body. Pulses palpable and strong. Full range of motion to lower extremities bilaterally. Negative focal neurological deficits noted. Strength intact.  Sensation intact. Small puncture wound identified to the plantar aspect of the right foot with negative active drainage or bleeding. Negative findings of cellulitic infection. Negative  red streaks. Tenderness upon palpation. Negative palpable foreign body identified. X-ray negative for radiopaque foreign bodies noted. Wound thoroughly cleaned. Patient currently up-to-date with tetanus shot, reported that she was seated approximate 3 years ago. Gait proper-negative step-offs or sway. Discussed case in great detail with attending physician who reported that patient does not need to be started on antibiotics. Patient stable, afebrile. Patient not septic appearing. Negative signs of respiratory distress. Discharged patient. Discussed with patient proper wound care. Discussed with patient to follow-up with her primary care provider within the next 24-48 hours for wound reassessment. Educated patient on signs and symptoms of wound infection. Discussed with patient to closely monitor symptoms and if symptoms are to worsen or change to report back to the ED - strict return instructions given.  Patient agreed to plan of care, understood, all questions answered.   Jamse Mead, PA-C 09/21/14 2202  Pamella Pert, MD 09/22/14 1229

## 2014-09-21 NOTE — ED Notes (Signed)
The pt is c/o a cut on the bottom of her rt foot.  She stepped on a piece of glass today at home.  The cut will not stop bleeding until she arrived.

## 2014-09-21 NOTE — Discharge Instructions (Signed)
Please call your doctor for a followup appointment within 24-48 hours. When you talk to your doctor please let them know that you were seen in the emergency department and have them acquire all of your records so that they can discuss the findings with you and formulate a treatment plan to fully care for your new and ongoing problems. Please follow-up with your primary care provider within 24-48 hours for wound to be reassessed Please keep site clean and covered with gauze Please rest, ice, elevate Please continue to monitor symptoms closely and if symptoms are to worsen or change (fever greater than 101, chills, sweating, nausea, vomiting, chest pain, shortness of breathe, difficulty breathing, weakness, numbness, tingling, worsening or changes to pain pattern, fall, injury, loss of sensation, pus drainage, active bleeding, swelling to the foot, red streaks, foot feels hot to the touch) please report back to the Emergency Department immediately.   Puncture Wound A puncture wound is an injury that extends through all layers of the skin and into the tissue beneath the skin (subcutaneous tissue). Puncture wounds become infected easily because germs often enter the body and go beneath the skin during the injury. Having a deep wound with a small entrance point makes it difficult for your caregiver to adequately clean the wound. This is especially true if you have stepped on a nail and it has passed through a dirty shoe or other situations where the wound is obviously contaminated. CAUSES  Many puncture wounds involve glass, nails, splinters, fish hooks, or other objects that enter the skin (foreign bodies). A puncture wound may also be caused by a human bite or animal bite. DIAGNOSIS  A puncture wound is usually diagnosed by your history and a physical exam. You may need to have an X-ray or an ultrasound to check for any foreign bodies still in the wound. TREATMENT   Your caregiver will clean the wound as  thoroughly as possible. Depending on the location of the wound, a bandage (dressing) may be applied.  Your caregiver might prescribe antibiotic medicines.  You may need a follow-up visit to check on your wound. Follow all instructions as directed by your caregiver. HOME CARE INSTRUCTIONS   Change your dressing once per day, or as directed by your caregiver. If the dressing sticks, it may be removed by soaking the area in water.  If your caregiver has given you follow-up instructions, it is very important that you return for a follow-up appointment. Not following up as directed could result in a chronic or permanent injury, pain, and disability.  Only take over-the-counter or prescription medicines for pain, discomfort, or fever as directed by your caregiver.  If you are given antibiotics, take them as directed. Finish them even if you start to feel better. You may need a tetanus shot if:  You cannot remember when you had your last tetanus shot.  You have never had a tetanus shot. If you got a tetanus shot, your arm may swell, get red, and feel warm to the touch. This is common and not a problem. If you need a tetanus shot and you choose not to have one, there is a rare chance of getting tetanus. Sickness from tetanus can be serious. You may need a rabies shot if an animal bite caused your puncture wound. SEEK MEDICAL CARE IF:   You have redness, swelling, or increasing pain in the wound.  You have red streaks going away from the wound.  You notice a bad smell coming from  the wound or dressing.  You have yellowish-white fluid (pus) coming from the wound.  You are treated with an antibiotic for infection, but the infection is not getting better.  You notice something in the wound, such as rubber from your shoe, cloth, or another object.  You have a fever.  You have severe pain.  You have difficulty breathing.  You feel dizzy or faint.  You cannot stop vomiting.  You lose  feeling, develop numbness, or cannot move a limb below the wound.  Your symptoms worsen. MAKE SURE YOU:  Understand these instructions.  Will watch your condition.  Will get help right away if you are not doing well or get worse. Document Released: 01/05/2005 Document Revised: 06/20/2011 Document Reviewed: 09/14/2010 [facility] Patient Information 2015 [place], [place]. This information is not intended to replace advice given to you by your health care provider. Make sure you discuss any questions you have with your health care provider.

## 2014-09-24 ENCOUNTER — Ambulatory Visit (INDEPENDENT_AMBULATORY_CARE_PROVIDER_SITE_OTHER): Payer: Medicare Other | Admitting: *Deleted

## 2014-09-24 ENCOUNTER — Telehealth: Payer: Self-pay | Admitting: Cardiology

## 2014-09-24 DIAGNOSIS — I429 Cardiomyopathy, unspecified: Secondary | ICD-10-CM

## 2014-09-24 DIAGNOSIS — I5022 Chronic systolic (congestive) heart failure: Secondary | ICD-10-CM

## 2014-09-24 DIAGNOSIS — I428 Other cardiomyopathies: Secondary | ICD-10-CM

## 2014-09-24 NOTE — Progress Notes (Signed)
Remote ICD transmission.   

## 2014-09-24 NOTE — Telephone Encounter (Signed)
Spoke with pt and reminded pt of remote transmission that is due today. Pt verbalized understanding.   

## 2014-09-28 LAB — CUP PACEART REMOTE DEVICE CHECK
Brady Statistic AS VP Percent: 0.03 %
Brady Statistic AS VS Percent: 96.26 %
Brady Statistic RV Percent Paced: 0.04 %
HIGH POWER IMPEDANCE MEASURED VALUE: 78 Ohm
Lead Channel Impedance Value: 532 Ohm
Lead Channel Impedance Value: 646 Ohm
Lead Channel Impedance Value: 779 Ohm
Lead Channel Pacing Threshold Amplitude: 0.5 V
Lead Channel Pacing Threshold Pulse Width: 0.4 ms
Lead Channel Sensing Intrinsic Amplitude: 3.625 mV
Lead Channel Sensing Intrinsic Amplitude: 3.625 mV
Lead Channel Sensing Intrinsic Amplitude: 8.625 mV
Lead Channel Setting Pacing Amplitude: 2 V
Lead Channel Setting Pacing Amplitude: 2.5 V
Lead Channel Setting Pacing Pulse Width: 0.4 ms
MDC IDC MSMT BATTERY REMAINING LONGEVITY: 109 mo
MDC IDC MSMT BATTERY VOLTAGE: 3.01 V
MDC IDC MSMT LEADCHNL RA PACING THRESHOLD AMPLITUDE: 0.625 V
MDC IDC MSMT LEADCHNL RA PACING THRESHOLD PULSEWIDTH: 0.4 ms
MDC IDC MSMT LEADCHNL RV SENSING INTR AMPL: 8.625 mV
MDC IDC SESS DTM: 20160615165804
MDC IDC SET LEADCHNL RV SENSING SENSITIVITY: 0.3 mV
MDC IDC SET ZONE DETECTION INTERVAL: 270 ms
MDC IDC SET ZONE DETECTION INTERVAL: 350 ms
MDC IDC STAT BRADY AP VP PERCENT: 0.01 %
MDC IDC STAT BRADY AP VS PERCENT: 3.7 %
MDC IDC STAT BRADY RA PERCENT PACED: 3.71 %
Zone Setting Detection Interval: 300 ms
Zone Setting Detection Interval: 400 ms

## 2014-10-01 ENCOUNTER — Encounter: Payer: Self-pay | Admitting: Cardiology

## 2014-10-06 ENCOUNTER — Encounter: Payer: Self-pay | Admitting: Internal Medicine

## 2014-10-06 ENCOUNTER — Other Ambulatory Visit: Payer: Self-pay

## 2014-10-08 ENCOUNTER — Ambulatory Visit (INDEPENDENT_AMBULATORY_CARE_PROVIDER_SITE_OTHER): Payer: Medicare Other | Admitting: Interventional Cardiology

## 2014-10-08 ENCOUNTER — Encounter: Payer: Self-pay | Admitting: Interventional Cardiology

## 2014-10-08 VITALS — BP 124/72 | HR 79 | Wt 205.0 lb

## 2014-10-08 DIAGNOSIS — Z0389 Encounter for observation for other suspected diseases and conditions ruled out: Secondary | ICD-10-CM

## 2014-10-08 DIAGNOSIS — I10 Essential (primary) hypertension: Secondary | ICD-10-CM | POA: Diagnosis not present

## 2014-10-08 DIAGNOSIS — R0602 Shortness of breath: Secondary | ICD-10-CM | POA: Diagnosis not present

## 2014-10-08 DIAGNOSIS — I428 Other cardiomyopathies: Secondary | ICD-10-CM

## 2014-10-08 DIAGNOSIS — R0789 Other chest pain: Secondary | ICD-10-CM

## 2014-10-08 DIAGNOSIS — I429 Cardiomyopathy, unspecified: Secondary | ICD-10-CM

## 2014-10-08 DIAGNOSIS — IMO0001 Reserved for inherently not codable concepts without codable children: Secondary | ICD-10-CM

## 2014-10-08 NOTE — Progress Notes (Signed)
Patient ID: Paula Booker, female   DOB: 1945/11/11, 69 y.o.   MRN: [id number]     Cardiology Office Note   Date:  10/08/2014   ID:  Paula Booker, DOB 05/26/1945, MRN [id number]  PCP:  Antony Blackbird, MD    No chief complaint on file. shortness of breath  Wt Readings from Last 3 Encounters:  10/08/14 205 lb (92.987 kg)  09/21/14 203 lb (92.08 kg)  08/27/14 200 lb (90.719 kg)       History of Present Illness: Paula Booker is a 69 y.o. female  who has a nonischemic cardiomyopathy. She has had WPW diagnosed on ECG as well by Dr. Lovena Le. She has had ICD implant and EP study for WPW with no inducible arrhythmia. Cardiomyopathy:   c/o Shortness of breath with exertion, Chest pain .   Denies:Palpitations   Complains DV:VOHYWVPXT.  Leg edema.    She was in the emergency room due to stepping on a piece of glass. At that time, she reported some chest discomfort to the emergency room physician. They recommended cardiology follow-up. The patient has had what she describes as very severe sharp chest pain. It comes on without any trigger. It goes away on its own as well. Of note, she had a cardiac cath in 2013 which was negative for any significant CAD.  She has had 2 episodes, one in October 2015 and 1 in May 2016.  In October 2015, she was seen in the ER for chest discomfort. She had a negative troponin and negative CT scan. She was sent home. She has not had any further chest discomfort like on that day. She states that she was under a lot of stress at that time due to moving. She thinks it was a panic attack.  The episode in May lasted a shorter time.  Past Medical History  Diagnosis Date  . Asthma   . GERD (gastroesophageal reflux disease)   . Hypertension   . ICD (implantable cardiac defibrillator) in place Dec 2013  . Shortness of breath     "@ any time I can get SOB" (03/22/2012)  . Migraines   . Skin cancer 1999  . WPW (Wolff-Parkinson-White syndrome)   . Pacemaker   .  CHF (congestive heart failure) 6/13  . Pneumonia     hx  . Anxiety   . NICM (nonischemic cardiomyopathy)   . Obesity (BMI 30-39.9)     negative sleep study 2014  . Head injury, acute, with loss of consciousness   . Depression   . Arthritis     "hands and knees" (03/22/2012)    Past Surgical History  Procedure Laterality Date  . Cardiac defibrillator placement  03/22/2012    DDD  . Cholecystectomy  ~ 2003  . Tonsillectomy and adenoidectomy  1950  . Skin cancer excision  1999    "top of my head and my right back shoulder"  . Mohs surgery  06/2011    "forehead" (03/22/2012)  . Supraventricular tachycardia ablation  03/22/2012    unable to induce VT/RN report 03/22/2012  . Tubal ligation  1978  . Kyphoplasty N/A 11/27/2013    Procedure: KYPHOPLASTY;  Surgeon: Sinclair Ship, MD;  Location: [place];  Service: Orthopedics;  Laterality: N/A;  T9, T11 kyphoplasty  . Cardiac catheterization  09/18/11    no significant CAD  . Supraventricular tachycardia ablation N/A 03/22/2012    Procedure: SUPRAVENTRICULAR TACHYCARDIA ABLATION;  Surgeon: Evans Lance, MD;  Location: [facility] CATH LAB;  Service: Cardiovascular;  Laterality: N/A;  . Implantable cardioverter defibrillator implant N/A 03/22/2012    Procedure: IMPLANTABLE CARDIOVERTER DEFIBRILLATOR IMPLANT;  Surgeon: Evans Lance, MD;  Location: [facility] CATH LAB;  Service: Cardiovascular;  Laterality: N/A;  . Skin cancer excision      back  . Skin cancer excision      face  . Kyphoplasty N/A 06/05/2014    Procedure: KYPHOPLASTY;  Surgeon: Sinclair Ship, MD;  Location: [place];  Service: Orthopedics;  Laterality: N/A;  T7 kyphoplasty     Current Outpatient Prescriptions  Medication Sig Dispense Refill  . albuterol (PROVENTIL HFA;VENTOLIN HFA) 108 (90 BASE) MCG/ACT inhaler Inhale 2 puffs into the lungs every 6 (six) hours as needed. For wheezing    . alendronate (FOSAMAX) 70 MG tablet Take 70 mg by mouth once a week. Take with a full  glass of water on an empty stomach.    . carvedilol (COREG) 12.5 MG tablet Take 1 tablet (12.5 mg total) by mouth 2 (two) times daily with a meal. 180 tablet 3  . cetirizine (ZYRTEC) 10 MG tablet Take 10 mg by mouth daily.    . Cholecalciferol (VITAMIN D3) 1000 UNITS CAPS Take 1,000 Units by mouth daily.     . citalopram (CELEXA) 20 MG tablet Take 20 mg by mouth daily.    . fluticasone (FLONASE) 50 MCG/ACT nasal spray Place 1 spray into both nostrils daily.    . furosemide (LASIX) 40 MG tablet TAKE ONE TABLET BY MOUTH ONCE DAILY 30 tablet 1  . gelatin 650 MG capsule Take 1,300 mg by mouth daily.    Marland Kitchen HYDROcodone-acetaminophen (NORCO/VICODIN) 5-325 MG per tablet Take 1 tablet by mouth every 6 (six) hours as needed for moderate pain. 10 tablet 0  . losartan (COZAAR) 50 MG tablet TAKE ONE TABLET BY MOUTH ONCE DAILY 30 tablet 3  . Misc Natural Products (OSTEO BI-FLEX ADV DOUBLE ST PO) Take 2 tablets by mouth every morning.     . Multiple Vitamins-Minerals (MULTIVITAMIN PO) Take 1 tablet by mouth daily. ALIVE    . naproxen sodium (ANAPROX) 220 MG tablet Take 440 mg by mouth 2 (two) times daily with a meal.    . omeprazole (PRILOSEC) 20 MG capsule Take 20 mg by mouth daily.     . potassium chloride (K-DUR,KLOR-CON) 10 MEQ tablet Take 10 mEq by mouth 2 (two) times daily.    Marland Kitchen spironolactone (ALDACTONE) 25 MG tablet Take 1 tablet (25 mg total) by mouth daily. 90 tablet 3   No current facility-administered medications for this visit.    Allergies:   Ivp dye; Penicillins; Sulfonamide derivatives; and Onion    Social History:  The patient  reports that she has never smoked. She has never used smokeless tobacco. She reports that she does not drink alcohol or use illicit drugs.   Family History:  The patient's family history includes Atrial fibrillation in her brother and mother; Congestive Heart Failure in her brother, father, and mother; Deafness in her father and mother; Healthy in her sister, sister,  and sister; Kidney Stones in her mother; Tuberculosis in her paternal grandfather.    ROS:  Please see the history of present illness.   Otherwise, review of systems are positive for chest pain and shortness of breath along with leg swelling.   All other systems are reviewed and negative.    PHYSICAL EXAM: VS:  BP 124/72 mmHg  Pulse 79  Wt 205 lb (92.987 kg)  SpO2 97% , BMI Body mass index  is 35.74 kg/(m^2). GEN: Well nourished, well developed, in no acute distress HEENT: normal Neck: no JVD, carotid bruits, or masses Cardiac: RRR; no murmurs, rubs, or gallops,no edema  Respiratory:  clear to auscultation bilaterally, normal work of breathing GI: soft, nontender, nondistended, + BS MS: no deformity or atrophy, trace leg edema Skin: warm and dry, no rash Neuro:  Strength and sensation are intact Psych: euthymic mood, full affect   EKG:   The ekg ordered today demonstrates normal sinus rhythm, left bundle branch block   Recent Labs: 06/03/2014: ALT 19 08/27/2014: B Natriuretic Peptide 48.5; BUN 24*; Creatinine, Ser 0.87; Hemoglobin 14.3; Platelets 266; Potassium 4.5; Sodium 138   Lipid Panel No results found for: CHOL, TRIG, HDL, CHOLHDL, VLDL, LDLCALC, LDLDIRECT   Other studies Reviewed: Additional studies/ records that were reviewed today with results demonstrating: 2013 cath with no significant coronary artery disease.   ASSESSMENT AND PLAN:  1. Chest pain: Given her negative ischemic workup in the past, I doubt this represents acute ischemia. We'll check echocardiogram to evaluate for any structural heart disease.  2. Nonischemic cardiomyopathy: She has been on Aldactone now for 6 months in addition to her other heart failure regimen. Will check to see if her EF has improved with echocardiogram; and also because of her chest pain.   3. Leg swelling: continue diuretics. Elevate legs to help with swelling.  She also reports shortness of breath. Will check BNP and electrolytes  given the Aldactone. 4. Hypertension: Continue antihypertensives.  On exam, she does not appear to be fluid overloaded. She has had issues with panic attacks in the past which have caused similar symptoms. Await results of the current evaluation.  Current medicines are reviewed at length with the patient today.  The patient concerns regarding her medicines were addressed.  The following changes have been made:  No change   Labs/ tests ordered today include:   Orders Placed This Encounter  Procedures  . B Nat Peptide  . Basic Metabolic Panel (BMET)  . EKG 12-Lead  . Echocardiogram    Recommend 150 minutes/week of aerobic exercise Low fat, low carb, high fiber diet recommended  Disposition:   FU in 1 year, unless tests ordered today come back abnormal.  If so, would plan sooner follow-up   Teresita Madura., MD  10/08/2014 5:43 PM    [facility] [place], [place], [place]  [address] Phone: [phone]; Fax: [phone]

## 2014-10-08 NOTE — Patient Instructions (Signed)
Medication Instructions:  None  Labwork: BNP, BMET today  Testing/Procedures: Your physician has requested that you have an echocardiogram. Echocardiography is a painless test that uses sound waves to create images of your heart. It provides your doctor with information about the size and shape of your heart and how well your heart's chambers and valves are working. This procedure takes approximately one hour. There are no restrictions for this procedure.   Follow-Up: Your physician wants you to follow-up in: 1 year with Dr. Irish Lack. You will receive a reminder letter in the mail two months in advance. If you don't receive a letter, please call our office to schedule the follow-up appointment.   Any Other Special Instructions Will Be Listed Below (If Applicable).

## 2014-10-09 LAB — BASIC METABOLIC PANEL
BUN: 23 mg/dL (ref 6–23)
CO2: 31 mEq/L (ref 19–32)
CREATININE: 0.92 mg/dL (ref 0.40–1.20)
Calcium: 9.7 mg/dL (ref 8.4–10.5)
Chloride: 104 mEq/L (ref 96–112)
GFR: 64.4 mL/min (ref >60.00)
Glucose, Bld: 97 mg/dL (ref 70–99)
POTASSIUM: 4.4 meq/L (ref 3.5–5.1)
SODIUM: 140 meq/L (ref 135–145)

## 2014-10-09 LAB — BRAIN NATRIURETIC PEPTIDE: PRO B NATRI PEPTIDE: 52 pg/mL (ref 0.0–100.0)

## 2014-10-15 ENCOUNTER — Encounter: Payer: Self-pay | Admitting: Cardiology

## 2014-10-21 ENCOUNTER — Other Ambulatory Visit: Payer: Self-pay

## 2014-10-21 ENCOUNTER — Ambulatory Visit (HOSPITAL_COMMUNITY): Payer: Medicare Other | Attending: Cardiology

## 2014-10-21 DIAGNOSIS — I10 Essential (primary) hypertension: Secondary | ICD-10-CM | POA: Diagnosis not present

## 2014-10-21 DIAGNOSIS — I456 Pre-excitation syndrome: Secondary | ICD-10-CM | POA: Insufficient documentation

## 2014-10-21 DIAGNOSIS — R0602 Shortness of breath: Secondary | ICD-10-CM | POA: Diagnosis present

## 2014-10-23 ENCOUNTER — Other Ambulatory Visit: Payer: Self-pay | Admitting: Interventional Cardiology

## 2014-12-07 ENCOUNTER — Other Ambulatory Visit: Payer: Self-pay | Admitting: Interventional Cardiology

## 2014-12-09 ENCOUNTER — Other Ambulatory Visit: Payer: Self-pay

## 2014-12-09 MED ORDER — LOSARTAN POTASSIUM 50 MG PO TABS: 50.0000 mg | ORAL_TABLET | Freq: Every day | ORAL | Status: DC

## 2014-12-09 NOTE — Telephone Encounter (Signed)
Jettie Booze, MD at 10/08/2014 3:39 PM    losartan (COZAAR) 50 MG tablet TAKE ONE TABLET BY MOUTH ONCE DAILY         Medication Instructions:  None

## 2014-12-25 ENCOUNTER — Telehealth: Payer: Self-pay | Admitting: Cardiology

## 2014-12-25 ENCOUNTER — Ambulatory Visit (INDEPENDENT_AMBULATORY_CARE_PROVIDER_SITE_OTHER): Payer: Medicare Other | Admitting: *Deleted

## 2014-12-25 DIAGNOSIS — I428 Other cardiomyopathies: Secondary | ICD-10-CM

## 2014-12-25 DIAGNOSIS — I429 Cardiomyopathy, unspecified: Secondary | ICD-10-CM | POA: Diagnosis not present

## 2014-12-25 DIAGNOSIS — I5022 Chronic systolic (congestive) heart failure: Secondary | ICD-10-CM

## 2014-12-25 NOTE — Progress Notes (Signed)
Remote ICD transmission.   

## 2014-12-25 NOTE — Telephone Encounter (Signed)
Spoke with pt and reminded pt of remote transmission that is due today. Pt verbalized understanding.   

## 2015-01-02 ENCOUNTER — Telehealth: Payer: Self-pay | Admitting: Interventional Cardiology

## 2015-01-02 MED ORDER — LOSARTAN POTASSIUM 25 MG PO TABS: 25.0000 mg | ORAL_TABLET | Freq: Two times a day (BID) | ORAL | Status: DC

## 2015-01-02 NOTE — Telephone Encounter (Signed)
I doubt it is the medicine.  If she wants to try take losartan 25 mg BID instead of 50 mg daily, that is fine

## 2015-01-02 NOTE — Telephone Encounter (Signed)
Pt states she takes Losartan 50mg , Coreg 12.5mg  and Spironolactone 25mg  every morning at 8AM. After 2 hours pt feels fatigued and lays back down and sleeps 2-3 more hours and then she is fine for the rest of the day. Pt states BP is normally 112/70, HR 57-60 resting and 80 when up moving around. Pt states that sometimes her BP drops to 100/45-65. Denies any other sx. Will forward to Dr. Irish Lack for review and advisement.

## 2015-01-02 NOTE — Telephone Encounter (Signed)
Spoke with pt and informed her of new suggestions per Dr. Irish Lack. Pt agreeable to try taking Losartan 25mg  BID. Verified pharmacy and sent in prescription.

## 2015-01-02 NOTE — Telephone Encounter (Signed)
PT HAS QUESTION RE MEDS, TAKING LOSARTAN, CARVEDILOL  AND SPRIONLACTONE  ABOUT AN HOUR AFTER TAKING  SHE FEELS SO SLEEPY SHE SLEEPS ANOTHER 2-3 HOURS , THIS HAS BEEN GOING ON A WHILE BUT GETTING WORSE FEELS MORE TIRED

## 2015-01-02 NOTE — Addendum Note (Signed)
Addended by: Loren Racer on: 01/02/2015 05:09 PM   Modules accepted: Orders, Medications

## 2015-01-08 LAB — CUP PACEART REMOTE DEVICE CHECK
Battery Voltage: 3 V
Brady Statistic AP VP Percent: 0.01 %
Brady Statistic AP VS Percent: 5.72 %
Brady Statistic AS VP Percent: 0.03 %
Brady Statistic AS VS Percent: 94.23 %
Brady Statistic RA Percent Paced: 5.74 %
Date Time Interrogation Session: 20160915175752
HighPow Impedance: 81 Ohm
Lead Channel Impedance Value: 475 Ohm
Lead Channel Impedance Value: 589 Ohm
Lead Channel Impedance Value: 722 Ohm
Lead Channel Pacing Threshold Pulse Width: 0.4 ms
Lead Channel Pacing Threshold Pulse Width: 0.4 ms
Lead Channel Sensing Intrinsic Amplitude: 4 mV
Lead Channel Sensing Intrinsic Amplitude: 8.875 mV
Lead Channel Setting Pacing Amplitude: 2 V
Lead Channel Setting Pacing Amplitude: 2.5 V
MDC IDC MSMT BATTERY REMAINING LONGEVITY: 106 mo
MDC IDC MSMT LEADCHNL RA PACING THRESHOLD AMPLITUDE: 0.5 V
MDC IDC MSMT LEADCHNL RA SENSING INTR AMPL: 4 mV
MDC IDC MSMT LEADCHNL RV PACING THRESHOLD AMPLITUDE: 0.5 V
MDC IDC MSMT LEADCHNL RV SENSING INTR AMPL: 8.875 mV
MDC IDC SET LEADCHNL RV PACING PULSEWIDTH: 0.4 ms
MDC IDC SET LEADCHNL RV SENSING SENSITIVITY: 0.3 mV
MDC IDC SET ZONE DETECTION INTERVAL: 350 ms
MDC IDC SET ZONE DETECTION INTERVAL: 400 ms
MDC IDC STAT BRADY RV PERCENT PACED: 0.04 %
Zone Setting Detection Interval: 270 ms
Zone Setting Detection Interval: 300 ms

## 2015-01-16 ENCOUNTER — Encounter: Payer: Self-pay | Admitting: Cardiology

## 2015-01-28 ENCOUNTER — Encounter: Payer: Self-pay | Admitting: Internal Medicine

## 2015-01-30 ENCOUNTER — Encounter: Payer: Self-pay | Admitting: Cardiology

## 2015-02-19 ENCOUNTER — Other Ambulatory Visit (HOSPITAL_COMMUNITY): Payer: Self-pay | Admitting: Orthopedic Surgery

## 2015-02-19 DIAGNOSIS — M546 Pain in thoracic spine: Secondary | ICD-10-CM

## 2015-02-19 DIAGNOSIS — M545 Low back pain: Secondary | ICD-10-CM

## 2015-02-25 ENCOUNTER — Ambulatory Visit (HOSPITAL_COMMUNITY)
Admission: RE | Admit: 2015-02-25 | Discharge: 2015-02-25 | Disposition: A | Discharge status: Home / Self Care | Payer: Medicare Other | Source: Ambulatory Visit | Attending: Orthopedic Surgery | Admitting: Orthopedic Surgery

## 2015-02-25 ENCOUNTER — Ambulatory Visit (HOSPITAL_COMMUNITY): Admission: RE | Admit: 2015-02-25 | Payer: Medicare Other | Source: Ambulatory Visit

## 2015-02-25 DIAGNOSIS — M4854XA Collapsed vertebra, not elsewhere classified, thoracic region, initial encounter for fracture: Secondary | ICD-10-CM | POA: Diagnosis not present

## 2015-02-25 DIAGNOSIS — M546 Pain in thoracic spine: Secondary | ICD-10-CM | POA: Diagnosis not present

## 2015-02-25 DIAGNOSIS — M545 Low back pain: Secondary | ICD-10-CM

## 2015-02-25 MED ORDER — TECHNETIUM TC 99M MEDRONATE IV KIT
26.1000 | PACK | Freq: Once | INTRAVENOUS | Status: AC | PRN
  Administered 2015-02-25: 26.1 via INTRAVENOUS

## 2015-03-04 ENCOUNTER — Ambulatory Visit: Payer: Medicare Other | Attending: Orthopedic Surgery

## 2015-03-04 DIAGNOSIS — M545 Low back pain, unspecified: Secondary | ICD-10-CM

## 2015-03-04 DIAGNOSIS — M62838 Other muscle spasm: Secondary | ICD-10-CM | POA: Diagnosis present

## 2015-03-04 DIAGNOSIS — R6889 Other general symptoms and signs: Secondary | ICD-10-CM

## 2015-03-04 DIAGNOSIS — M2560 Stiffness of unspecified joint, not elsewhere classified: Secondary | ICD-10-CM | POA: Diagnosis present

## 2015-03-04 DIAGNOSIS — M546 Pain in thoracic spine: Secondary | ICD-10-CM

## 2015-03-04 DIAGNOSIS — M62830 Muscle spasm of back: Secondary | ICD-10-CM | POA: Diagnosis present

## 2015-03-04 NOTE — Therapy (Addendum)
Status New   PT SHORT TERM GOAL #3   Title She will report improved tolerance to walking in [employer]   Time 4   Period Weeks   Status New           PT Long Term Goals - 04-03-15 1306    PT LONG TERM GOAL #1   Title She will be independent with all HEP issued as of last visit   Time 8   Period Weeks   Status New   PT LONG TERM GOAL #2   Title She ith daily home taskswill report pain eased 50%     Time 8   Period Weeks   Status New   PT LONG TERM GOAL #3   Title She will report being able to walk to shop in [place] without increased pain   Time 8   Period Weeks   Status New   PT LONG TERM GOAL #4   Title She will report being able to sit for 60 min without incr pain.   Time 8   Period Weeks   Status New   PT LONG TERM GOAL #5   Title She will report able to do dishes and cut fabric with minmal to no increase in pain   Time 8   Period Weeks   Status New               Plan - 04/03/2015 1302    Pt will benefit from skilled therapeutic intervention in order to improve on the following deficits Pain;Postural dysfunction;Increased muscle spasms;Decreased activity tolerance;Decreased range of motion   Rehab Potential Good   PT Frequency 2x / week   PT Duration 8 weeks   PT Treatment/Interventions Cryotherapy;Iontophoresis 58m/ml Dexamethasone;Moist Heat;Ultrasound;Therapeutic exercise;Patient/family education;Taping;Manual techniques;Dry needling;Passive range of motion   PT Next Visit Plan REview HEP., manual , needling, heat, posture ed.    PT Home Exercise Plan stretch and initial abdominal stabilization   Consulted and Agree with Plan of Care Patient          G-Codes - 1Dec 23, 20161154    Functional Assessment Tool Used FOTO 56%   Functional Limitation  Changing and maintaining body position   Changing and Maintaining Body Position Current Status ((S1779 At least 40 percent but less than 60 percent impaired, limited or restricted   Changing and Maintaining Body Position Goal Status ((T9030 At least 20 percent but less than 40 percent impaired, limited or restricted       Problem List Patient Active Problem List   Diagnosis Date Noted  . Compression fracture 06/05/2014  . Essential hypertension 03/27/2014  . Chest pain 01/27/2014  . Normal coronary arteries 01/27/2014  . Obesity (BMI 30-39.9)   . ICD- MDT implanted Dec 2013 03/26/2013  . NICM (nonischemic cardiomyopathy) (HOto 02/21/2013  . GERD (gastroesophageal reflux disease) 04/23/2012  . Asthma exacerbation 04/23/2012  . Shortness of breath 04/23/2012  . Syncope 03/01/2012  . Acute on chronic systolic CHF (congestive heart failure) ([place] 03/01/2012  . Type B WPW syndrome- RFA Dec 2013 03/01/2012    CDarrel HooverPT 1Dec 23, 2016 1:18 PM  [facility] [address]Phone: [phone]  Fax:  [phone] Name: JXitlalli NewhardMRN: [id number] of Birth: 109-29-1947  PHYSICAL THERAPY DISCHARGE SUMMARY  Visits from Start of Care: Eval only  Current functional level related to goals / functional outcomes: She did not return after eval   Remaining deficits: Unknown  Status New   PT SHORT TERM GOAL #3   Title She will report improved tolerance to walking in [employer]   Time 4   Period Weeks   Status New           PT Long Term Goals - 04-03-15 1306    PT LONG TERM GOAL #1   Title She will be independent with all HEP issued as of last visit   Time 8   Period Weeks   Status New   PT LONG TERM GOAL #2   Title She ith daily home taskswill report pain eased 50%     Time 8   Period Weeks   Status New   PT LONG TERM GOAL #3   Title She will report being able to walk to shop in [place] without increased pain   Time 8   Period Weeks   Status New   PT LONG TERM GOAL #4   Title She will report being able to sit for 60 min without incr pain.   Time 8   Period Weeks   Status New   PT LONG TERM GOAL #5   Title She will report able to do dishes and cut fabric with minmal to no increase in pain   Time 8   Period Weeks   Status New               Plan - 04/03/2015 1302    Pt will benefit from skilled therapeutic intervention in order to improve on the following deficits Pain;Postural dysfunction;Increased muscle spasms;Decreased activity tolerance;Decreased range of motion   Rehab Potential Good   PT Frequency 2x / week   PT Duration 8 weeks   PT Treatment/Interventions Cryotherapy;Iontophoresis 58m/ml Dexamethasone;Moist Heat;Ultrasound;Therapeutic exercise;Patient/family education;Taping;Manual techniques;Dry needling;Passive range of motion   PT Next Visit Plan REview HEP., manual , needling, heat, posture ed.    PT Home Exercise Plan stretch and initial abdominal stabilization   Consulted and Agree with Plan of Care Patient          G-Codes - 1Dec 23, 20161154    Functional Assessment Tool Used FOTO 56%   Functional Limitation  Changing and maintaining body position   Changing and Maintaining Body Position Current Status ((S1779 At least 40 percent but less than 60 percent impaired, limited or restricted   Changing and Maintaining Body Position Goal Status ((T9030 At least 20 percent but less than 40 percent impaired, limited or restricted       Problem List Patient Active Problem List   Diagnosis Date Noted  . Compression fracture 06/05/2014  . Essential hypertension 03/27/2014  . Chest pain 01/27/2014  . Normal coronary arteries 01/27/2014  . Obesity (BMI 30-39.9)   . ICD- MDT implanted Dec 2013 03/26/2013  . NICM (nonischemic cardiomyopathy) (HOto 02/21/2013  . GERD (gastroesophageal reflux disease) 04/23/2012  . Asthma exacerbation 04/23/2012  . Shortness of breath 04/23/2012  . Syncope 03/01/2012  . Acute on chronic systolic CHF (congestive heart failure) ([place] 03/01/2012  . Type B WPW syndrome- RFA Dec 2013 03/01/2012    CDarrel HooverPT 1Dec 23, 2016 1:18 PM  [facility] [address]Phone: [phone]  Fax:  [phone] Name: JXitlalli NewhardMRN: [id number] of Birth: 109-29-1947  PHYSICAL THERAPY DISCHARGE SUMMARY  Visits from Start of Care: Eval only  Current functional level related to goals / functional outcomes: She did not return after eval   Remaining deficits: Unknown  Status New   PT SHORT TERM GOAL #3   Title She will report improved tolerance to walking in [employer]   Time 4   Period Weeks   Status New           PT Long Term Goals - 04-03-15 1306    PT LONG TERM GOAL #1   Title She will be independent with all HEP issued as of last visit   Time 8   Period Weeks   Status New   PT LONG TERM GOAL #2   Title She ith daily home taskswill report pain eased 50%     Time 8   Period Weeks   Status New   PT LONG TERM GOAL #3   Title She will report being able to walk to shop in [place] without increased pain   Time 8   Period Weeks   Status New   PT LONG TERM GOAL #4   Title She will report being able to sit for 60 min without incr pain.   Time 8   Period Weeks   Status New   PT LONG TERM GOAL #5   Title She will report able to do dishes and cut fabric with minmal to no increase in pain   Time 8   Period Weeks   Status New               Plan - 04/03/2015 1302    Pt will benefit from skilled therapeutic intervention in order to improve on the following deficits Pain;Postural dysfunction;Increased muscle spasms;Decreased activity tolerance;Decreased range of motion   Rehab Potential Good   PT Frequency 2x / week   PT Duration 8 weeks   PT Treatment/Interventions Cryotherapy;Iontophoresis 58m/ml Dexamethasone;Moist Heat;Ultrasound;Therapeutic exercise;Patient/family education;Taping;Manual techniques;Dry needling;Passive range of motion   PT Next Visit Plan REview HEP., manual , needling, heat, posture ed.    PT Home Exercise Plan stretch and initial abdominal stabilization   Consulted and Agree with Plan of Care Patient          G-Codes - 1Dec 23, 20161154    Functional Assessment Tool Used FOTO 56%   Functional Limitation  Changing and maintaining body position   Changing and Maintaining Body Position Current Status ((S1779 At least 40 percent but less than 60 percent impaired, limited or restricted   Changing and Maintaining Body Position Goal Status ((T9030 At least 20 percent but less than 40 percent impaired, limited or restricted       Problem List Patient Active Problem List   Diagnosis Date Noted  . Compression fracture 06/05/2014  . Essential hypertension 03/27/2014  . Chest pain 01/27/2014  . Normal coronary arteries 01/27/2014  . Obesity (BMI 30-39.9)   . ICD- MDT implanted Dec 2013 03/26/2013  . NICM (nonischemic cardiomyopathy) (HOto 02/21/2013  . GERD (gastroesophageal reflux disease) 04/23/2012  . Asthma exacerbation 04/23/2012  . Shortness of breath 04/23/2012  . Syncope 03/01/2012  . Acute on chronic systolic CHF (congestive heart failure) ([place] 03/01/2012  . Type B WPW syndrome- RFA Dec 2013 03/01/2012    CDarrel HooverPT 1Dec 23, 2016 1:18 PM  [facility] [address]Phone: [phone]  Fax:  [phone] Name: JXitlalli NewhardMRN: [id number] of Birth: 109-29-1947  PHYSICAL THERAPY DISCHARGE SUMMARY  Visits from Start of Care: Eval only  Current functional level related to goals / functional outcomes: She did not return after eval   Remaining deficits: Unknown  [place], [place], [address] Phone: [phone]   Fax:  [phone]  Physical Therapy Evaluation  Patient Details  Name: Alizee Maple MRN: [id number] Date of Birth: 10/29/1945 Referring Provider: Phylliss Bob ,MD  Encounter Date: 03/04/2015      PT End of Session - 03/04/15 1211    Visit Number 1   Number of Visits 16   Date for PT Re-Evaluation 04/29/15   Authorization Type Medicare     Authorization Time Period KX  at 32 visit   Authorization - Visit Number 1   Authorization - Number of Visits 16   PT Start Time 1150   PT Stop Time 1245   PT Time Calculation (min) 55 min   Activity Tolerance Patient limited by pain   Behavior During Therapy [facility] for tasks assessed/performed      Past Medical History  Diagnosis Date  . Asthma   . GERD (gastroesophageal reflux disease)   . Hypertension   . ICD (implantable cardiac defibrillator) in place Dec 2013  . Shortness of breath     "@ any time I can get SOB" (03/22/2012)  . Migraines   . Skin cancer 1999  . WPW (Wolff-Parkinson-White syndrome)   . Pacemaker   . CHF (congestive heart failure) 6/13  . Pneumonia     hx  . Anxiety   . NICM (nonischemic cardiomyopathy)   . Obesity (BMI 30-39.9)     negative sleep study 2014  . Head injury, acute, with loss of consciousness   . Depression   . Arthritis     "hands and knees" (03/22/2012)    Past Surgical History  Procedure Laterality Date  . Cardiac defibrillator placement  03/22/2012    DDD  . Cholecystectomy  ~ 2003  . Tonsillectomy and adenoidectomy  1950  . Skin cancer excision  1999    "top of my head and my right back shoulder"  . Mohs surgery  06/2011    "forehead" (03/22/2012)  . Supraventricular tachycardia ablation  03/22/2012    unable to induce VT/RN report 03/22/2012  . Tubal ligation  1978  . Kyphoplasty N/A 11/27/2013    Procedure: KYPHOPLASTY;  Surgeon: Sinclair Ship, MD;  Location: [place];  Service: Orthopedics;  Laterality: N/A;  T9, T11 kyphoplasty  . Cardiac catheterization  09/18/11    no significant CAD  . Supraventricular tachycardia ablation N/A 03/22/2012    Procedure: SUPRAVENTRICULAR TACHYCARDIA ABLATION;  Surgeon: Evans Lance, MD;  Location: [facility] CATH LAB;  Service: Cardiovascular;  Laterality: N/A;  . Implantable cardioverter defibrillator implant N/A 03/22/2012    Procedure: IMPLANTABLE CARDIOVERTER DEFIBRILLATOR IMPLANT;  Surgeon: Evans Lance, MD;  Location: [facility] CATH LAB;  Service: Cardiovascular;  Laterality: N/A;  . Skin cancer excision      back  . Skin cancer excision      face  . Kyphoplasty N/A 06/05/2014    Procedure: KYPHOPLASTY;  Surgeon: Sinclair Ship, MD;  Location: [place];  Service: Orthopedics;  Laterality: N/A;  T7 kyphoplasty    There were no vitals filed for this visit.  Visit Diagnosis:  Bilateral low back pain without sciatica - Plan: PT plan of care cert/re-cert  Bilateral thoracic back pain - Plan: PT plan of care cert/re-cert  Joint stiffness of spine - Plan: PT plan of care cert/re-cert  Activity intolerance - Plan: PT plan of care cert/re-cert  Back muscle spasm - Plan: PT plan of care cert/re-cert  Muscle

## 2015-03-04 NOTE — Patient Instructions (Signed)
Issued from cabinet transverse abdominus, post pelvic tilt (10 reps, 5-10 sec hold)  and stretching knee to chest, LTR( 3 reps RT and LT  10-30 sec hold x 3x/day

## 2015-03-07 ENCOUNTER — Emergency Department (EMERGENCY_DEPARTMENT_HOSPITAL): Payer: Medicare Other

## 2015-03-07 ENCOUNTER — Encounter (HOSPITAL_COMMUNITY): Payer: Self-pay | Admitting: Neurology

## 2015-03-07 ENCOUNTER — Emergency Department (HOSPITAL_COMMUNITY)
Admission: EM | Admit: 2015-03-07 | Discharge: 2015-03-07 | Disposition: A | Discharge status: Home / Self Care | Payer: Medicare Other | Attending: Emergency Medicine | Admitting: Emergency Medicine

## 2015-03-07 DIAGNOSIS — Z87828 Personal history of other (healed) physical injury and trauma: Secondary | ICD-10-CM | POA: Diagnosis not present

## 2015-03-07 DIAGNOSIS — K219 Gastro-esophageal reflux disease without esophagitis: Secondary | ICD-10-CM | POA: Insufficient documentation

## 2015-03-07 DIAGNOSIS — Z791 Long term (current) use of non-steroidal anti-inflammatories (NSAID): Secondary | ICD-10-CM | POA: Diagnosis not present

## 2015-03-07 DIAGNOSIS — F329 Major depressive disorder, single episode, unspecified: Secondary | ICD-10-CM | POA: Insufficient documentation

## 2015-03-07 DIAGNOSIS — Z79899 Other long term (current) drug therapy: Secondary | ICD-10-CM | POA: Diagnosis not present

## 2015-03-07 DIAGNOSIS — E669 Obesity, unspecified: Secondary | ICD-10-CM | POA: Diagnosis not present

## 2015-03-07 DIAGNOSIS — I10 Essential (primary) hypertension: Secondary | ICD-10-CM | POA: Diagnosis not present

## 2015-03-07 DIAGNOSIS — M79609 Pain in unspecified limb: Secondary | ICD-10-CM | POA: Diagnosis not present

## 2015-03-07 DIAGNOSIS — F419 Anxiety disorder, unspecified: Secondary | ICD-10-CM | POA: Insufficient documentation

## 2015-03-07 DIAGNOSIS — Z880 Allergy status to penicillin: Secondary | ICD-10-CM | POA: Insufficient documentation

## 2015-03-07 DIAGNOSIS — I509 Heart failure, unspecified: Secondary | ICD-10-CM | POA: Diagnosis not present

## 2015-03-07 DIAGNOSIS — Z85820 Personal history of malignant melanoma of skin: Secondary | ICD-10-CM | POA: Diagnosis not present

## 2015-03-07 DIAGNOSIS — M1990 Unspecified osteoarthritis, unspecified site: Secondary | ICD-10-CM | POA: Insufficient documentation

## 2015-03-07 DIAGNOSIS — Z8701 Personal history of pneumonia (recurrent): Secondary | ICD-10-CM | POA: Diagnosis not present

## 2015-03-07 DIAGNOSIS — J45901 Unspecified asthma with (acute) exacerbation: Secondary | ICD-10-CM | POA: Diagnosis not present

## 2015-03-07 DIAGNOSIS — Z950 Presence of cardiac pacemaker: Secondary | ICD-10-CM | POA: Diagnosis not present

## 2015-03-07 DIAGNOSIS — Z9889 Other specified postprocedural states: Secondary | ICD-10-CM | POA: Insufficient documentation

## 2015-03-07 DIAGNOSIS — Z8669 Personal history of other diseases of the nervous system and sense organs: Secondary | ICD-10-CM | POA: Diagnosis not present

## 2015-03-07 DIAGNOSIS — M7989 Other specified soft tissue disorders: Secondary | ICD-10-CM

## 2015-03-07 DIAGNOSIS — R2242 Localized swelling, mass and lump, left lower limb: Secondary | ICD-10-CM | POA: Diagnosis not present

## 2015-03-07 NOTE — Progress Notes (Signed)
VASCULAR LAB PRELIMINARY  PRELIMINARY  PRELIMINARY  PRELIMINARY  Left lower extremity venous duplex completed.    Preliminary report:  Left:  No evidence of DVT, superficial thrombosis, or Baker's cyst.  Paula Booker, RVS 03/07/2015, 4:41 PM

## 2015-03-07 NOTE — Discharge Instructions (Signed)
Edema °Edema is an abnormal buildup of fluids in your body tissues. Edema is somewhat dependent on gravity to pull the fluid to the lowest place in your body. That makes the condition more common in the legs and thighs (lower extremities). Painless swelling of the feet and ankles is common and becomes more likely as you get older. It is also common in looser tissues, like around your eyes.  °When the affected area is squeezed, the fluid may move out of that spot and leave a dent for a few moments. This dent is called pitting.  °CAUSES  °There are many possible causes of edema. Eating too much salt and being on your feet or sitting for a long time can cause edema in your legs and ankles. Hot weather may make edema worse. Common medical causes of edema include: °· Heart failure. °· Liver disease. °· Kidney disease. °· Weak blood vessels in your legs. °· Cancer. °· An injury. °· Pregnancy. °· Some medications. °· Obesity.  °SYMPTOMS  °Edema is usually painless. Your skin may look swollen or shiny.  °DIAGNOSIS  °Your health care provider may be able to diagnose edema by asking about your medical history and doing a physical exam. You may need to have tests such as X-rays, an electrocardiogram, or blood tests to check for medical conditions that may cause edema.  °TREATMENT  °Edema treatment depends on the cause. If you have heart, liver, or kidney disease, you need the treatment appropriate for these conditions. General treatment may include: °· Elevation of the affected body part above the level of your heart. °· Compression of the affected body part. Pressure from elastic bandages or support stockings squeezes the tissues and forces fluid back into the blood vessels. This keeps fluid from entering the tissues. °· Restriction of fluid and salt intake. °· Use of a water pill (diuretic). These medications are appropriate only for some types of edema. They pull fluid out of your body and make you urinate more often. This  gets rid of fluid and reduces swelling, but diuretics can have side effects. Only use diuretics as directed by your health care provider. °HOME CARE INSTRUCTIONS  °· Keep the affected body part above the level of your heart when you are lying down.   °· Do not sit still or stand for prolonged periods.   °· Do not put anything directly under your knees when lying down. °· Do not wear constricting clothing or garters on your upper legs.   °· Exercise your legs to work the fluid back into your blood vessels. This may help the swelling go down.   °· Wear elastic bandages or support stockings to reduce ankle swelling as directed by your health care provider.   °· Eat a low-salt diet to reduce fluid if your health care provider recommends it.   °· Only take medicines as directed by your health care provider.  °SEEK MEDICAL CARE IF:  °· Your edema is not responding to treatment. °· You have heart, liver, or kidney disease and notice symptoms of edema. °· You have edema in your legs that does not improve after elevating them.   °· You have sudden and unexplained weight gain. °SEEK IMMEDIATE MEDICAL CARE IF:  °· You develop shortness of breath or chest pain.   °· You cannot breathe when you lie down. °· You develop pain, redness, or warmth in the swollen areas.   °· You have heart, liver, or kidney disease and suddenly get edema. °· You have a fever and your symptoms suddenly get worse. °MAKE SURE YOU:  °·   Understand these instructions. °· Will watch your condition. °· Will get help right away if you are not doing well or get worse. °  °This information is not intended to replace advice given to you by your health care provider. Make sure you discuss any questions you have with your health care provider. °  °Document Released: 03/28/2005 Document Revised: 04/18/2014 Document Reviewed: 01/18/2013 °Elsevier Interactive Patient Education ©2016 [employer] ° °

## 2015-03-07 NOTE — ED Provider Notes (Signed)
CSN: [id number]     Arrival date & time 03/07/15  1204 History   First MD Initiated Contact with Patient 03/07/15 1226     Chief Complaint  Patient presents with  . Foot Swelling     (Consider location/radiation/quality/duration/timing/severity/associated sxs/prior Treatment) HPI Patient presents to the emergency department with foot swelling that she noticed 2 days ago.  The patient states that she had some tenderness over the top of her foot and was concerned about blood clot.  Patient does have some calf pain.  She denies chest pain, shortness of breath, weakness, dizziness, headache, blurred vision, rash, fever, incontinence, abdominal pain, back pain, near syncope or syncope.  The patient states that nothing seems make her condition better.  Palpation of the area makes the pain worse Past Medical History  Diagnosis Date  . Asthma   . GERD (gastroesophageal reflux disease)   . Hypertension   . ICD (implantable cardiac defibrillator) in place Dec 2013  . Shortness of breath     "@ any time I can get SOB" (03/22/2012)  . Migraines   . Skin cancer 1999  . WPW (Wolff-Parkinson-White syndrome)   . Pacemaker   . CHF (congestive heart failure) ([place]) 6/13  . Pneumonia     hx  . Anxiety   . NICM (nonischemic cardiomyopathy) ([place])   . Obesity (BMI 30-39.9)     negative sleep study 2014  . Head injury, acute, with loss of consciousness ([place])   . Depression   . Arthritis     "hands and knees" (03/22/2012)   Past Surgical History  Procedure Laterality Date  . Cardiac defibrillator placement  03/22/2012    DDD  . Cholecystectomy  ~ 2003  . Tonsillectomy and adenoidectomy  1950  . Skin cancer excision  1999    "top of my head and my right back shoulder"  . Mohs surgery  06/2011    "forehead" (03/22/2012)  . Supraventricular tachycardia ablation  03/22/2012    unable to induce VT/RN report 03/22/2012  . Tubal ligation  1978  . Kyphoplasty N/A 11/27/2013    Procedure: KYPHOPLASTY;   Surgeon: Sinclair Ship, MD;  Location: [place];  Service: Orthopedics;  Laterality: N/A;  T9, T11 kyphoplasty  . Cardiac catheterization  09/18/11    no significant CAD  . Supraventricular tachycardia ablation N/A 03/22/2012    Procedure: SUPRAVENTRICULAR TACHYCARDIA ABLATION;  Surgeon: Evans Lance, MD;  Location: [facility] CATH LAB;  Service: Cardiovascular;  Laterality: N/A;  . Implantable cardioverter defibrillator implant N/A 03/22/2012    Procedure: IMPLANTABLE CARDIOVERTER DEFIBRILLATOR IMPLANT;  Surgeon: Evans Lance, MD;  Location: [facility] CATH LAB;  Service: Cardiovascular;  Laterality: N/A;  . Skin cancer excision      back  . Skin cancer excision      face  . Kyphoplasty N/A 06/05/2014    Procedure: KYPHOPLASTY;  Surgeon: Sinclair Ship, MD;  Location: [place];  Service: Orthopedics;  Laterality: N/A;  T7 kyphoplasty   Family History  Problem Relation Age of Onset  . Tuberculosis Paternal Grandfather   . Congestive Heart Failure Brother   . Atrial fibrillation Brother   . Congestive Heart Failure Mother     died 65  . Kidney Stones Mother   . Deafness Mother   . Atrial fibrillation Mother   . Congestive Heart Failure Father   . Deafness Father   . Healthy Sister   . Healthy Sister   . Healthy Sister    Social History  Substance Use Topics  . Smoking status: Never Smoker   . Smokeless tobacco: Never Used  . Alcohol Use: No   OB History    No data available     Review of Systems  All other systems negative except as documented in the HPI. All pertinent positives and negatives as reviewed in the HPI.  Allergies  Ivp dye; Penicillins; Sulfonamide derivatives; and Onion  Home Medications   Prior to Admission medications   Medication Sig Start Date End Date Taking? Authorizing Provider  albuterol (PROVENTIL HFA;VENTOLIN HFA) 108 (90 BASE) MCG/ACT inhaler Inhale 2 puffs into the lungs every 6 (six) hours as needed. For wheezing   Yes Historical Provider, MD    alendronate (FOSAMAX) 70 MG tablet Take 70 mg by mouth once a week. Take with a full glass of water on an empty stomach.   Yes Historical Provider, MD  carvedilol (COREG) 12.5 MG tablet Take 1 tablet (12.5 mg total) by mouth 2 (two) times daily with a meal. 03/27/14  Yes Jettie Booze, MD  cetirizine (ZYRTEC) 10 MG tablet Take 10 mg by mouth daily.   Yes Historical Provider, MD  Cholecalciferol (VITAMIN D3) 1000 UNITS CAPS Take 1,000 Units by mouth daily.    Yes Historical Provider, MD  citalopram (CELEXA) 20 MG tablet Take 20 mg by mouth daily.   Yes Historical Provider, MD  fluticasone (FLONASE) 50 MCG/ACT nasal spray Place 1 spray into both nostrils daily as needed for allergies.  01/22/14  Yes Historical Provider, MD  furosemide (LASIX) 40 MG tablet TAKE ONE TABLET BY MOUTH ONCE DAILY 10/24/14  Yes Jettie Booze, MD  gelatin 650 MG capsule Take 1,300 mg by mouth daily.   Yes Historical Provider, MD  losartan (COZAAR) 25 MG tablet Take 1 tablet (25 mg total) by mouth 2 (two) times daily. 01/02/15  Yes Jettie Booze, MD  Multiple Vitamins-Minerals (MULTIVITAMIN PO) Take 1 tablet by mouth daily. ALIVE   Yes Historical Provider, MD  naproxen sodium (ANAPROX) 220 MG tablet Take 440 mg by mouth daily.    Yes Historical Provider, MD  omeprazole (PRILOSEC) 20 MG capsule Take 20 mg by mouth daily.    Yes Historical Provider, MD  spironolactone (ALDACTONE) 25 MG tablet Take 1 tablet (25 mg total) by mouth daily. 03/27/14  Yes Jettie Booze, MD   BP 119/71 mmHg  Pulse 68  Temp(Src) 98.9 F (37.2 C) (Oral)  Resp 18  SpO2 98% Physical Exam  Constitutional: She is oriented to person, place, and time. She appears well-developed and well-nourished. No distress.  HENT:  Head: Normocephalic and atraumatic.  Mouth/Throat: Oropharynx is clear and moist.  Eyes: Pupils are equal, round, and reactive to light.  Neck: Normal range of motion. Neck supple.  Cardiovascular: Normal rate,  regular rhythm and normal heart sounds.  Exam reveals no gallop and no friction rub.   No murmur heard. Pulmonary/Chest: Effort normal and breath sounds normal. No respiratory distress. She has no wheezes.  Musculoskeletal: She exhibits no edema.       Legs:      Feet:  Neurological: She is alert and oriented to person, place, and time. She exhibits normal muscle tone. Coordination normal.  Skin: Skin is warm and dry. No rash noted. No erythema.  Psychiatric: She has a normal mood and affect. Her behavior is normal.  Nursing note and vitals reviewed.   ED Course  Procedures (including critical care time) Labs Review Labs Reviewed - No data to  display  Imaging Review No results found. I have personally reviewed and evaluated these images and lab results as part of my medical decision-making. Patient will have venous Doppler done of the left lower extremity looking for DVT.   Dalia Heading, PA-C 03/09/15 [id number]  Sherwood Gambler, MD 03/10/15 0000

## 2015-03-07 NOTE — ED Notes (Signed)
Pt reports area of swelling to left top of foot since yesterday. Is concerned it could be a blood clot and has hx of CHF. Foot is appropriately warm to touch. Pulses present. Pt is ambulatory.

## 2015-03-07 NOTE — ED Provider Notes (Signed)
6:00 PM BP 118/65 mmHg  Pulse 76  Temp(Src) 98.9 F (37.2 C) (Oral)  Resp 18  SpO2 96% Patient seen in the ED for left foot pain and swelling. Onset yesterday. Patient concerned it could possibly be a blood clot. Her DVT study is negative.  She has mild tenderness to palpation on exam with full range of motion of the left foot and ankle. Neurovascularly intact. She is able to ambulate. The patient is currently safe for discharge. She will follow-up with her primary care physician on Tuesday of this coming week.  Margarita Mail, PA-C 03/07/15 1801  Merrily Pew, MD 03/09/15 [phone]

## 2015-03-17 ENCOUNTER — Ambulatory Visit: Payer: Medicare Other | Attending: Orthopedic Surgery | Admitting: Physical Therapy

## 2015-03-18 ENCOUNTER — Inpatient Hospital Stay (HOSPITAL_COMMUNITY)
Admission: EM | Admit: 2015-03-18 | Discharge: 2015-03-24 | DRG: 871 | Disposition: A | Discharge status: Home Health | Payer: Medicare Other | Attending: Internal Medicine | Admitting: Internal Medicine

## 2015-03-18 ENCOUNTER — Encounter (HOSPITAL_COMMUNITY): Payer: Self-pay

## 2015-03-18 ENCOUNTER — Emergency Department (HOSPITAL_COMMUNITY): Payer: Medicare Other

## 2015-03-18 DIAGNOSIS — Z9049 Acquired absence of other specified parts of digestive tract: Secondary | ICD-10-CM | POA: Diagnosis not present

## 2015-03-18 DIAGNOSIS — Z85828 Personal history of other malignant neoplasm of skin: Secondary | ICD-10-CM

## 2015-03-18 DIAGNOSIS — J189 Pneumonia, unspecified organism: Secondary | ICD-10-CM | POA: Diagnosis present

## 2015-03-18 DIAGNOSIS — I456 Pre-excitation syndrome: Secondary | ICD-10-CM | POA: Diagnosis present

## 2015-03-18 DIAGNOSIS — A419 Sepsis, unspecified organism: Secondary | ICD-10-CM | POA: Diagnosis present

## 2015-03-18 DIAGNOSIS — R197 Diarrhea, unspecified: Secondary | ICD-10-CM

## 2015-03-18 DIAGNOSIS — R112 Nausea with vomiting, unspecified: Secondary | ICD-10-CM | POA: Diagnosis present

## 2015-03-18 DIAGNOSIS — E860 Dehydration: Secondary | ICD-10-CM | POA: Diagnosis present

## 2015-03-18 DIAGNOSIS — N179 Acute kidney failure, unspecified: Secondary | ICD-10-CM | POA: Diagnosis present

## 2015-03-18 DIAGNOSIS — Z8249 Family history of ischemic heart disease and other diseases of the circulatory system: Secondary | ICD-10-CM | POA: Diagnosis not present

## 2015-03-18 DIAGNOSIS — I428 Other cardiomyopathies: Secondary | ICD-10-CM

## 2015-03-18 DIAGNOSIS — R6510 Systemic inflammatory response syndrome (SIRS) of non-infectious origin without acute organ dysfunction: Secondary | ICD-10-CM | POA: Diagnosis not present

## 2015-03-18 DIAGNOSIS — Z79899 Other long term (current) drug therapy: Secondary | ICD-10-CM | POA: Diagnosis not present

## 2015-03-18 DIAGNOSIS — I429 Cardiomyopathy, unspecified: Secondary | ICD-10-CM | POA: Diagnosis present

## 2015-03-18 DIAGNOSIS — Z6830 Body mass index (BMI) 30.0-30.9, adult: Secondary | ICD-10-CM | POA: Diagnosis not present

## 2015-03-18 DIAGNOSIS — I10 Essential (primary) hypertension: Secondary | ICD-10-CM | POA: Diagnosis present

## 2015-03-18 DIAGNOSIS — Z66 Do not resuscitate: Secondary | ICD-10-CM | POA: Diagnosis present

## 2015-03-18 DIAGNOSIS — B9689 Other specified bacterial agents as the cause of diseases classified elsewhere: Secondary | ICD-10-CM | POA: Diagnosis present

## 2015-03-18 DIAGNOSIS — E876 Hypokalemia: Secondary | ICD-10-CM | POA: Diagnosis present

## 2015-03-18 DIAGNOSIS — N390 Urinary tract infection, site not specified: Secondary | ICD-10-CM | POA: Diagnosis present

## 2015-03-18 DIAGNOSIS — E669 Obesity, unspecified: Secondary | ICD-10-CM | POA: Diagnosis present

## 2015-03-18 DIAGNOSIS — J181 Lobar pneumonia, unspecified organism: Secondary | ICD-10-CM | POA: Diagnosis present

## 2015-03-18 HISTORY — DX: Nausea with vomiting, unspecified: R11.2

## 2015-03-18 LAB — CBC
HCT: 37.9 % (ref 36.0–46.0)
HEMOGLOBIN: 12.6 g/dL (ref 12.0–15.0)
MCH: 30.4 pg (ref 26.0–34.0)
MCHC: 33.2 g/dL (ref 30.0–36.0)
MCV: 91.5 fL (ref 78.0–100.0)
Platelets: 197 10*3/uL (ref 150–400)
RBC: 4.14 MIL/uL (ref 3.87–5.11)
RDW: 12.5 % (ref 11.5–15.5)
WBC: 23.9 10*3/uL — ABNORMAL HIGH (ref 4.0–10.5)

## 2015-03-18 LAB — I-STAT CG4 LACTIC ACID, ED
LACTIC ACID, VENOUS: 0.92 mmol/L (ref 0.5–2.0)
LACTIC ACID, VENOUS: 1.21 mmol/L (ref 0.5–2.0)

## 2015-03-18 LAB — COMPREHENSIVE METABOLIC PANEL
ALK PHOS: 71 U/L (ref 38–126)
ALT: 14 U/L (ref 14–54)
AST: 18 U/L (ref 15–41)
Albumin: 3.7 g/dL (ref 3.5–5.0)
Anion gap: 11 (ref 5–15)
BUN: 30 mg/dL — ABNORMAL HIGH (ref 6–20)
CALCIUM: 8.9 mg/dL (ref 8.9–10.3)
CO2: 20 mmol/L — ABNORMAL LOW (ref 22–32)
Chloride: 99 mmol/L — ABNORMAL LOW (ref 101–111)
Creatinine, Ser: 1.28 mg/dL — ABNORMAL HIGH (ref 0.44–1.00)
GFR calc Af Amer: 48 mL/min — ABNORMAL LOW (ref >60)
GFR calc non Af Amer: 42 mL/min — ABNORMAL LOW (ref >60)
Glucose, Bld: 156 mg/dL — ABNORMAL HIGH (ref 65–99)
POTASSIUM: 3.3 mmol/L — AB (ref 3.5–5.1)
SODIUM: 130 mmol/L — AB (ref 135–145)
TOTAL PROTEIN: 7.5 g/dL (ref 6.5–8.1)
Total Bilirubin: 1.3 mg/dL — ABNORMAL HIGH (ref 0.3–1.2)

## 2015-03-18 LAB — C DIFFICILE QUICK SCREEN W PCR REFLEX
C DIFFICILE (CDIFF) INTERP: NEGATIVE
C DIFFICILE (CDIFF) TOXIN: NEGATIVE
C Diff antigen: NEGATIVE

## 2015-03-18 LAB — LIPASE, BLOOD: Lipase: 22 U/L (ref 11–51)

## 2015-03-18 MED ORDER — ACETAMINOPHEN 650 MG RE SUPP
650.0000 mg | Freq: Four times a day (QID) | RECTAL | Status: DC | PRN
  Filled 2015-03-18: qty 1

## 2015-03-18 MED ORDER — ENOXAPARIN SODIUM 40 MG/0.4ML ~~LOC~~ SOLN
40.0000 mg | Freq: Every day | SUBCUTANEOUS | Status: DC
  Administered 2015-03-19 – 2015-03-23 (×6): 40 mg via SUBCUTANEOUS
  Filled 2015-03-18 (×6): qty 0.4

## 2015-03-18 MED ORDER — SODIUM CHLORIDE 0.9 % IV BOLUS (SEPSIS)
1000.0000 mL | Freq: Once | INTRAVENOUS | Status: AC
  Administered 2015-03-18: 1000 mL via INTRAVENOUS

## 2015-03-18 MED ORDER — FLUTICASONE PROPIONATE 50 MCG/ACT NA SUSP
1.0000 | Freq: Every day | NASAL | Status: DC | PRN
  Filled 2015-03-18: qty 16

## 2015-03-18 MED ORDER — VITAMIN D 1000 UNITS PO TABS
1000.0000 [IU] | ORAL_TABLET | Freq: Every day | ORAL | Status: DC
Start: 2015-03-19 — End: 2015-03-24
  Administered 2015-03-19 – 2015-03-24 (×6): 1000 [IU] via ORAL
  Filled 2015-03-18 (×6): qty 1

## 2015-03-18 MED ORDER — CITALOPRAM HYDROBROMIDE 20 MG PO TABS
20.0000 mg | ORAL_TABLET | Freq: Every day | ORAL | Status: DC
  Administered 2015-03-19 – 2015-03-24 (×6): 20 mg via ORAL
  Filled 2015-03-18 (×6): qty 1

## 2015-03-18 MED ORDER — LOSARTAN POTASSIUM 25 MG PO TABS
25.0000 mg | ORAL_TABLET | Freq: Two times a day (BID) | ORAL | Status: DC
  Filled 2015-03-18 (×2): qty 1

## 2015-03-18 MED ORDER — ONDANSETRON HCL 4 MG PO TABS: 4.0000 mg | ORAL_TABLET | Freq: Four times a day (QID) | ORAL | Status: DC | PRN

## 2015-03-18 MED ORDER — PANTOPRAZOLE SODIUM 40 MG PO TBEC
40.0000 mg | DELAYED_RELEASE_TABLET | Freq: Every day | ORAL | Status: DC
  Administered 2015-03-19 – 2015-03-24 (×6): 40 mg via ORAL
  Filled 2015-03-18 (×6): qty 1

## 2015-03-18 MED ORDER — CARVEDILOL 12.5 MG PO TABS
12.5000 mg | ORAL_TABLET | Freq: Two times a day (BID) | ORAL | Status: DC
  Administered 2015-03-19 – 2015-03-24 (×11): 12.5 mg via ORAL
  Filled 2015-03-18 (×10): qty 1

## 2015-03-18 MED ORDER — SODIUM CHLORIDE 0.9 % IV BOLUS (SEPSIS)
1000.0000 mL | Freq: Once | INTRAVENOUS | Status: AC
Start: 2015-03-18 — End: 2015-03-18
  Administered 2015-03-18: 1000 mL via INTRAVENOUS

## 2015-03-18 MED ORDER — METRONIDAZOLE IN NACL 5-0.79 MG/ML-% IV SOLN
500.0000 mg | Freq: Three times a day (TID) | INTRAVENOUS | Status: DC
  Administered 2015-03-19: 500 mg via INTRAVENOUS
  Filled 2015-03-18 (×2): qty 100

## 2015-03-18 MED ORDER — ACETAMINOPHEN 325 MG PO TABS
650.0000 mg | ORAL_TABLET | Freq: Four times a day (QID) | ORAL | Status: DC | PRN
  Administered 2015-03-19: 650 mg via ORAL
  Filled 2015-03-18 (×3): qty 2

## 2015-03-18 MED ORDER — SODIUM CHLORIDE 0.9 % IV SOLN
INTRAVENOUS | Status: DC
  Administered 2015-03-19: 01:00:00 via INTRAVENOUS

## 2015-03-18 MED ORDER — ONDANSETRON HCL 4 MG/2ML IJ SOLN
4.0000 mg | Freq: Four times a day (QID) | INTRAMUSCULAR | Status: DC | PRN
  Administered 2015-03-19: 4 mg via INTRAVENOUS
  Filled 2015-03-18: qty 2

## 2015-03-18 MED ORDER — ALBUTEROL SULFATE (2.5 MG/3ML) 0.083% IN NEBU: 2.5000 mg | INHALATION_SOLUTION | Freq: Four times a day (QID) | RESPIRATORY_TRACT | Status: DC | PRN

## 2015-03-18 MED ORDER — METRONIDAZOLE IN NACL 5-0.79 MG/ML-% IV SOLN
500.0000 mg | Freq: Three times a day (TID) | INTRAVENOUS | Status: DC
  Administered 2015-03-18: 500 mg via INTRAVENOUS
  Filled 2015-03-18 (×2): qty 100

## 2015-03-18 MED ORDER — DOXYCYCLINE HYCLATE 100 MG IV SOLR
100.0000 mg | Freq: Two times a day (BID) | INTRAVENOUS | Status: DC
  Administered 2015-03-18: 100 mg via INTRAVENOUS
  Filled 2015-03-18: qty 100

## 2015-03-18 MED ORDER — PNEUMOCOCCAL VAC POLYVALENT 25 MCG/0.5ML IJ INJ
0.5000 mL | INJECTION | INTRAMUSCULAR | Status: AC
  Administered 2015-03-19: 0.5 mL via INTRAMUSCULAR
  Filled 2015-03-18 (×2): qty 0.5

## 2015-03-18 MED ORDER — LORATADINE 10 MG PO TABS
10.0000 mg | ORAL_TABLET | Freq: Every day | ORAL | Status: DC
  Administered 2015-03-19 – 2015-03-24 (×6): 10 mg via ORAL
  Filled 2015-03-18 (×6): qty 1

## 2015-03-18 MED ORDER — ALBUTEROL SULFATE HFA 108 (90 BASE) MCG/ACT IN AERS: 2.0000 | INHALATION_SPRAY | Freq: Four times a day (QID) | RESPIRATORY_TRACT | Status: DC | PRN

## 2015-03-18 MED ORDER — INFLUENZA VAC SPLIT QUAD 0.5 ML IM SUSY
0.5000 mL | PREFILLED_SYRINGE | INTRAMUSCULAR | Status: AC
  Administered 2015-03-19: 0.5 mL via INTRAMUSCULAR
  Filled 2015-03-18 (×2): qty 0.5

## 2015-03-18 NOTE — H&P (Signed)
[facility] History and Physical  Paula Booker SAY:[id number] DOB: 07-02-1945 DOA: 03/18/2015  Referring physician: Mr.Browning. PCP: Cain Saupe, MD  Specialists: Dr.Taylor.Cardiologist.  Chief Complaint: Nausea vomiting and diarrhea.  HPI: Paula Booker is a 69 y.o. female with history of nonischemic cardiomyopathy last use admission was 20-25%, chronic LBBB, history of WPW syndrome, hypertension presents to the ER because of persistent nausea vomiting and diarrhea over the last 3 days. Patient has been recently to [place] to visit her family and one half grandsons had croup. Denies any contacts with people having gastroenteritis or have not had any uncooked food. Denies any blood in the vomitus or diarrhea. Patient does have some abdominal pain after each vomiting. CT abdomen and pelvis only shows possible pneumonia otherwise unremarkable. Blood cultures have been obtained and started on fluids and antibiotics. Stool for C. difficile has been negative. Patient otherwise denies any chest pain or shortness of breath. Has some productive cough since started vomiting.  Review of Systems: As presented in the history of presenting illness, rest negative.  Past Medical History  Diagnosis Date  . Asthma   . GERD (gastroesophageal reflux disease)   . Hypertension   . ICD (implantable cardiac defibrillator) in place Dec 2013  . Shortness of breath     "@ any time I can get SOB" (03/22/2012)  . Migraines   . Skin cancer 1999  . WPW (Wolff-Parkinson-White syndrome)   . Pacemaker   . CHF (congestive heart failure) ([facility]) 6/13  . Pneumonia     hx  . Anxiety   . NICM (nonischemic cardiomyopathy) ([facility])   . Obesity (BMI 30-39.9)     negative sleep study 2014  . Head injury, acute, with loss of consciousness ([facility])   . Depression   . Arthritis     "hands and knees" (03/22/2012)   Past Surgical History  Procedure Laterality Date  . Cardiac defibrillator placement  03/22/2012     DDD  . Cholecystectomy  ~ 2003  . Tonsillectomy and adenoidectomy  1950  . Skin cancer excision  1999    "top of my head and my right back shoulder"  . Mohs surgery  06/2011    "forehead" (03/22/2012)  . Supraventricular tachycardia ablation  03/22/2012    unable to induce VT/RN report 03/22/2012  . Tubal ligation  1978  . Kyphoplasty N/A 11/27/2013    Procedure: KYPHOPLASTY;  Surgeon: Emilee Hero, MD;  Location: [facility] OR;  Service: Orthopedics;  Laterality: N/A;  T9, T11 kyphoplasty  . Cardiac catheterization  09/18/11    no significant CAD  . Supraventricular tachycardia ablation N/A 03/22/2012    Procedure: SUPRAVENTRICULAR TACHYCARDIA ABLATION;  Surgeon: Marinus Maw, MD;  Location: [facility] CATH LAB;  Service: Cardiovascular;  Laterality: N/A;  . Implantable cardioverter defibrillator implant N/A 03/22/2012    Procedure: IMPLANTABLE CARDIOVERTER DEFIBRILLATOR IMPLANT;  Surgeon: Marinus Maw, MD;  Location: [facility] CATH LAB;  Service: Cardiovascular;  Laterality: N/A;  . Skin cancer excision      back  . Skin cancer excision      face  . Kyphoplasty N/A 06/05/2014    Procedure: KYPHOPLASTY;  Surgeon: Emilee Hero, MD;  Location: [facility] OR;  Service: Orthopedics;  Laterality: N/A;  T7 kyphoplasty   Social History:  reports that she has never smoked. She has never used smokeless tobacco. She reports that she does not drink alcohol or use illicit drugs. Where does patient live home. Can patient participate in ADLs? Yes.   Family  History:  Family History  Problem Relation Age of Onset  . Tuberculosis Paternal Grandfather   . Congestive Heart Failure Brother   . Atrial fibrillation Brother   . Congestive Heart Failure Mother     died 70  . Kidney Stones Mother   . Deafness Mother   . Atrial fibrillation Mother   . Congestive Heart Failure Father   . Deafness Father   . Healthy Sister   . Healthy Sister   . Healthy Sister       Prior to Admission medications    Medication Sig Start Date End Date Taking? Authorizing Provider  albuterol (PROVENTIL HFA;VENTOLIN HFA) 108 (90 BASE) MCG/ACT inhaler Inhale 2 puffs into the lungs every 6 (six) hours as needed. For wheezing   Yes Historical Provider, MD  alendronate (FOSAMAX) 70 MG tablet Take 70 mg by mouth once a week. Take with a full glass of water on an empty stomach.   Yes Historical Provider, MD  carvedilol (COREG) 12.5 MG tablet Take 1 tablet (12.5 mg total) by mouth 2 (two) times daily with a meal. 03/27/14  Yes Corky Crafts, MD  cetirizine (ZYRTEC) 10 MG tablet Take 10 mg by mouth daily.   Yes Historical Provider, MD  Cholecalciferol (VITAMIN D3) 1000 UNITS CAPS Take 1,000 Units by mouth daily.    Yes Historical Provider, MD  citalopram (CELEXA) 20 MG tablet Take 20 mg by mouth daily.   Yes Historical Provider, MD  fluticasone (FLONASE) 50 MCG/ACT nasal spray Place 1 spray into both nostrils daily as needed for allergies.  01/22/14  Yes Historical Provider, MD  furosemide (LASIX) 40 MG tablet TAKE ONE TABLET BY MOUTH ONCE DAILY 10/24/14  Yes Corky Crafts, MD  gelatin 650 MG capsule Take 1,300 mg by mouth daily.   Yes Historical Provider, MD  losartan (COZAAR) 25 MG tablet Take 1 tablet (25 mg total) by mouth 2 (two) times daily. 01/02/15  Yes Corky Crafts, MD  Multiple Vitamins-Minerals (MULTIVITAMIN PO) Take 1 tablet by mouth daily. ALIVE   Yes Historical Provider, MD  naproxen sodium (ANAPROX) 220 MG tablet Take 440 mg by mouth daily.    Yes Historical Provider, MD  omeprazole (PRILOSEC) 20 MG capsule Take 20 mg by mouth daily.    Yes Historical Provider, MD  spironolactone (ALDACTONE) 25 MG tablet Take 1 tablet (25 mg total) by mouth daily. 03/27/14  Yes Corky Crafts, MD    Physical Exam: Filed Vitals:   03/18/15 1551 03/18/15 1926 03/18/15 2245 03/18/15 2303  BP: 101/53 105/62 126/69 125/57  Pulse: 106 100 97 100  Temp: 99.1 F (37.3 C) 99.2 F (37.3 C) 98.3 F (36.8  C) 98.9 F (37.2 C)  TempSrc: Oral Oral Oral Oral  Resp: 22 18 16 16   Height:    5' 3.5" (1.613 m)  Weight:    93.9 kg (207 lb 0.2 oz)  SpO2: 93% 95% 98% 98%     General:  Moderately built and nourished.  Eyes: Anicteric no pallor.  ENT: No discharge from the ears eyes nose and mouth.  Neck: No mass felt.  Cardiovascular: S1-S2 heard.  Respiratory: No rhonchi or crepitations.  Abdomen: Soft nontender bowel sounds present.  Skin: No rash.  Musculoskeletal: No edema.  Psychiatric: Appears normal.  Neurologic: Alert awake oriented to time place and person. Moves all extremities.  Labs on Admission:  Basic Metabolic Panel:  Recent Labs Lab 03/18/15 1610  NA 130*  K 3.3*  CL 99*  CO2 20*  GLUCOSE 156*  BUN 30*  CREATININE 1.28*  CALCIUM 8.9   Liver Function Tests:  Recent Labs Lab 03/18/15 1610  AST 18  ALT 14  ALKPHOS 71  BILITOT 1.3*  PROT 7.5  ALBUMIN 3.7    Recent Labs Lab 03/18/15 1610  LIPASE 22   No results for input(s): AMMONIA in the last 168 hours. CBC:  Recent Labs Lab 03/18/15 1610  WBC 23.9*  HGB 12.6  HCT 37.9  MCV 91.5  PLT 197   Cardiac Enzymes: No results for input(s): CKTOTAL, CKMB, CKMBINDEX, TROPONINI in the last 168 hours.  BNP (last 3 results)  Recent Labs  08/27/14 0221  BNP 48.5    ProBNP (last 3 results)  Recent Labs  10/08/14 1613  PROBNP 52.0    CBG: No results for input(s): GLUCAP in the last 168 hours.  Radiological Exams on Admission: Ct Abdomen Pelvis Wo Contrast  03/18/2015  CLINICAL DATA:  Vomiting, diarrhea for 2 days EXAM: CT ABDOMEN AND PELVIS WITHOUT CONTRAST TECHNIQUE: Multidetector CT imaging of the abdomen and pelvis was performed following the standard protocol without IV contrast. COMPARISON:  01/27/2014 FINDINGS: There are streaky artifacts from patient's large body habitus. Partially visualized cardiac pacemaker lead in right ventricle. Sagittal images of the spine shows prior  vertebroplasty lower thoracic spine at T11 and T9 level. Small hiatal hernia is noted. There is partially visualized infiltrate/pneumonia in left lower lobe posterior laterally. Please see axial image 1. Unenhanced liver shows no biliary ductal dilatation. Status postcholecystectomy. Unenhanced pancreas, spleen and adrenal glands are unremarkable. Unenhanced kidneys are symmetrical in size. No hydronephrosis or hydroureter. No nephrolithiasis. No calcified ureteral calculi. Oral contrast material was given to the patient. There is no small bowel obstruction. No ascites or free air. No adenopathy. The terminal ileum is unremarkable. Normal appendix is partially visualized in axial image 56. The uterus is anteflexed. No adnexal mass. No distal colonic obstruction. No colitis or diverticulitis. Contrast material noted within rectum. The urinary bladder is unremarkable. IMPRESSION: 1. There is infiltrate/pneumonia in left lower lobe posterior and lateral partially visualized. Please see axial image 1. 2. Small hiatal hernia. 3. No nephrolithiasis.  No hydronephrosis or hydroureter. 4. No small bowel obstruction. 5. No pericecal inflammation. Normal appendix. Unremarkable terminal ileum. 6. No colonic obstruction.  Unremarkable uterus and adnexa. Electronically Signed   By: Natasha Mead M.D.   On: 03/18/2015 19:32   Dg Chest 2 View  03/18/2015  CLINICAL DATA:  Cardiac arrhythmia.  Nausea and vomiting. EXAM: CHEST  2 VIEW COMPARISON:  Aug 27, 2014 FINDINGS: There is a pacemaker with lead tips attached to the right atrium and right ventricle. No pneumothorax. There is atelectatic change in the left lower lobe. Lungs elsewhere clear. Heart size and pulmonary vascularity are normal. No adenopathy. The patient has undergone kyphoplasty procedures at several levels in the thoracic spine. IMPRESSION: Pacemaker leads attached to right atrium right ventricle. No pneumothorax. Atelectasis left base. Lungs elsewhere clear.  Electronically Signed   By: Bretta Bang III M.D.   On: 03/18/2015 20:26    EKG: Independently reviewed. Sinus tachycardia with LBBB.  Assessment/Plan Principal Problem:   SIRS (systemic inflammatory response syndrome) ([facility]) Active Problems:   Type B WPW syndrome- RFA Dec 2013   NICM (nonischemic cardiomyopathy) ([facility])   Diarrhea   Nausea vomiting and diarrhea   1. SIRS with impending sepsis possible source could be gastroenteritis with possible pneumonia - at this time GI pathogen panel has been  ordered. Stool for C. difficile has been negative. CT abdomen and pelvis only shows possible pneumonia. Since patient has prolonged QTC and broad QRS and is allergic to penicillin patient has been placed on doxycycline in addition to Flagyl. Follow blood cultures lactic acid levels procalcitonin levels and continue hydration with caution that patient has nonischemic cardiomyopathy. Holding all patients spironolactone and Lasix for now along with holding off Cozaar also. As per pneumonia order set will check urine for Legionella strep antigen influenza PCR and sputum cultures. 2. Nonischemic cardiomyopathy last evening shows 20-25% - holding off diuretics and Cozaar due to #1. Closely monitor respiratory status. 3. Hypertension - see #1 and 2. When necessary IV hydralazine for systolic blood pressure more than 160. 4. History of WPW syndrome. 5. Chronic LBBB.  I have reviewed patient's old charts on labs. Posterior patient's EKG and chest x-ray.   DVT Prophylaxis Lovenox.  Code Status: DO NOT RESUSCITATE.  Family Communication: Discussed with patient.  Disposition Plan: Admit to inpatient.    Joie Reamer N. [facility] Pager [phone].  If 7PM-7AM, please contact night-coverage [email or web address] Password [facility] 03/18/2015, 11:46 PM

## 2015-03-18 NOTE — ED Notes (Signed)
Per EMS pt comes from home c/o vomiting since Monday.  Pt has 22g in left forearm, pt was given Zofran 4mg .  Pt has pacemaker and defib.

## 2015-03-18 NOTE — ED Provider Notes (Signed)
CSN: [id number]     Arrival date & time 03/18/15  1538 History   First MD Initiated Contact with Patient 03/18/15 1703     Chief Complaint  Patient presents with  . Emesis  . Diarrhea     (Consider location/radiation/quality/duration/timing/severity/associated sxs/prior Treatment) HPI Comments: Patient with PMH of WPW, pacemaker presents to the ED with a chief complaint nausea, vomiting, and diarrhea.  States that she has been having the symptoms for the past 2 days.  Reports that her symptoms are severe.  She states that she has felt very fatigued.  She denies any recent fevers or chills.  Reports having central abdominal pain.  Denies prior abdominal surgeries.  Denies recent travel.  Denies recent antibiotics.  The history is provided by the patient. No language interpreter was used.    Past Medical History  Diagnosis Date  . Asthma   . GERD (gastroesophageal reflux disease)   . Hypertension   . ICD (implantable cardiac defibrillator) in place Dec 2013  . Shortness of breath     "@ any time I can get SOB" (03/22/2012)  . Migraines   . Skin cancer 1999  . WPW (Wolff-Parkinson-White syndrome)   . Pacemaker   . CHF (congestive heart failure) ([place]) 6/13  . Pneumonia     hx  . Anxiety   . NICM (nonischemic cardiomyopathy) ([place])   . Obesity (BMI 30-39.9)     negative sleep study 2014  . Head injury, acute, with loss of consciousness ([place])   . Depression   . Arthritis     "hands and knees" (03/22/2012)   Past Surgical History  Procedure Laterality Date  . Cardiac defibrillator placement  03/22/2012    DDD  . Cholecystectomy  ~ 2003  . Tonsillectomy and adenoidectomy  1950  . Skin cancer excision  1999    "top of my head and my right back shoulder"  . Mohs surgery  06/2011    "forehead" (03/22/2012)  . Supraventricular tachycardia ablation  03/22/2012    unable to induce VT/RN report 03/22/2012  . Tubal ligation  1978  . Kyphoplasty N/A 11/27/2013    Procedure:  KYPHOPLASTY;  Surgeon: Sinclair Ship, MD;  Location: [place];  Service: Orthopedics;  Laterality: N/A;  T9, T11 kyphoplasty  . Cardiac catheterization  09/18/11    no significant CAD  . Supraventricular tachycardia ablation N/A 03/22/2012    Procedure: SUPRAVENTRICULAR TACHYCARDIA ABLATION;  Surgeon: Evans Lance, MD;  Location: [facility] CATH LAB;  Service: Cardiovascular;  Laterality: N/A;  . Implantable cardioverter defibrillator implant N/A 03/22/2012    Procedure: IMPLANTABLE CARDIOVERTER DEFIBRILLATOR IMPLANT;  Surgeon: Evans Lance, MD;  Location[facility] CATH LAB;  Service: Cardiovascular;  Laterality: N/A;  . Skin cancer excision      back  . Skin cancer excision      face  . Kyphoplasty N/A 06/05/2014    Procedure: KYPHOPLASTY;  Surgeon: Sinclair Ship, MD;  Location: [place];  Service: Orthopedics;  Laterality: N/A;  T7 kyphoplasty   Family History  Problem Relation Age of Onset  . Tuberculosis Paternal Grandfather   . Congestive Heart Failure Brother   . Atrial fibrillation Brother   . Congestive Heart Failure Mother     died 69  . Kidney Stones Mother   . Deafness Mother   . Atrial fibrillation Mother   . Congestive Heart Failure Father   . Deafness Father   . Healthy Sister   . Healthy Sister   . Healthy  Sister    Social History  Substance Use Topics  . Smoking status: Never Smoker   . Smokeless tobacco: Never Used  . Alcohol Use: No   OB History    No data available     Review of Systems  Constitutional: Negative for fever and chills.  Respiratory: Negative for shortness of breath.   Cardiovascular: Negative for chest pain.  Gastrointestinal: Positive for nausea, vomiting, abdominal pain and diarrhea. Negative for constipation.  Genitourinary: Negative for dysuria.  All other systems reviewed and are negative.     Allergies  Ivp dye; Penicillins; Sulfonamide derivatives; and Onion  Home Medications   Prior to Admission medications   Medication  Sig Start Date End Date Taking? Authorizing Provider  albuterol (PROVENTIL HFA;VENTOLIN HFA) 108 (90 BASE) MCG/ACT inhaler Inhale 2 puffs into the lungs every 6 (six) hours as needed. For wheezing   Yes Historical Provider, MD  alendronate (FOSAMAX) 70 MG tablet Take 70 mg by mouth once a week. Take with a full glass of water on an empty stomach.   Yes Historical Provider, MD  carvedilol (COREG) 12.5 MG tablet Take 1 tablet (12.5 mg total) by mouth 2 (two) times daily with a meal. 03/27/14  Yes Jettie Booze, MD  cetirizine (ZYRTEC) 10 MG tablet Take 10 mg by mouth daily.   Yes Historical Provider, MD  Cholecalciferol (VITAMIN D3) 1000 UNITS CAPS Take 1,000 Units by mouth daily.    Yes Historical Provider, MD  citalopram (CELEXA) 20 MG tablet Take 20 mg by mouth daily.   Yes Historical Provider, MD  fluticasone (FLONASE) 50 MCG/ACT nasal spray Place 1 spray into both nostrils daily as needed for allergies.  01/22/14  Yes Historical Provider, MD  furosemide (LASIX) 40 MG tablet TAKE ONE TABLET BY MOUTH ONCE DAILY 10/24/14  Yes Jettie Booze, MD  gelatin 650 MG capsule Take 1,300 mg by mouth daily.   Yes Historical Provider, MD  losartan (COZAAR) 25 MG tablet Take 1 tablet (25 mg total) by mouth 2 (two) times daily. 01/02/15  Yes Jettie Booze, MD  Multiple Vitamins-Minerals (MULTIVITAMIN PO) Take 1 tablet by mouth daily. ALIVE   Yes Historical Provider, MD  naproxen sodium (ANAPROX) 220 MG tablet Take 440 mg by mouth daily.    Yes Historical Provider, MD  omeprazole (PRILOSEC) 20 MG capsule Take 20 mg by mouth daily.    Yes Historical Provider, MD  spironolactone (ALDACTONE) 25 MG tablet Take 1 tablet (25 mg total) by mouth daily. 03/27/14  Yes Jettie Booze, MD   BP 101/53 mmHg  Pulse 106  Temp(Src) 99.1 F (37.3 C) (Oral)  Resp 22  SpO2 93% Physical Exam  Constitutional: She is oriented to person, place, and time. She appears well-developed and well-nourished.  HENT:   Head: Normocephalic and atraumatic.  Eyes: Conjunctivae and EOM are normal. Pupils are equal, round, and reactive to light.  Neck: Normal range of motion. Neck supple.  Cardiovascular: Normal rate and regular rhythm.  Exam reveals no gallop and no friction rub.   No murmur heard. Pulmonary/Chest: Effort normal and breath sounds normal. No respiratory distress. She has no wheezes. She has no rales. She exhibits no tenderness.  Abdominal: Soft. Bowel sounds are normal. She exhibits no distension and no mass. There is tenderness. There is no rebound and no guarding.  Moderate central abdominal tenderness  Musculoskeletal: Normal range of motion. She exhibits no edema or tenderness.  Neurological: She is alert and oriented to person, place,  and time.  Skin: Skin is warm and dry.  Psychiatric: She has a normal mood and affect. Her behavior is normal. Judgment and thought content normal.  Nursing note and vitals reviewed.   ED Course  Procedures (including critical care time) Labs Review Labs Reviewed  COMPREHENSIVE METABOLIC PANEL - Abnormal; Notable for the following:    Sodium 130 (*)    Potassium 3.3 (*)    Chloride 99 (*)    CO2 20 (*)    Glucose, Bld 156 (*)    BUN 30 (*)    Creatinine, Ser 1.28 (*)    Total Bilirubin 1.3 (*)    GFR calc non Af Amer 42 (*)    GFR calc Af Amer 48 (*)    All other components within normal limits  CBC - Abnormal; Notable for the following:    WBC 23.9 (*)    All other components within normal limits  LIPASE, BLOOD  URINALYSIS, ROUTINE W REFLEX MICROSCOPIC (NOT AT [facility])  I-STAT CG4 LACTIC ACID, ED    Imaging Review Ct Abdomen Pelvis Wo Contrast  03/18/2015  CLINICAL DATA:  Vomiting, diarrhea for 2 days EXAM: CT ABDOMEN AND PELVIS WITHOUT CONTRAST TECHNIQUE: Multidetector CT imaging of the abdomen and pelvis was performed following the standard protocol without IV contrast. COMPARISON:  01/27/2014 FINDINGS: There are streaky artifacts from  patient's large body habitus. Partially visualized cardiac pacemaker lead in right ventricle. Sagittal images of the spine shows prior vertebroplasty lower thoracic spine at T11 and T9 level. Small hiatal hernia is noted. There is partially visualized infiltrate/pneumonia in left lower lobe posterior laterally. Please see axial image 1. Unenhanced liver shows no biliary ductal dilatation. Status postcholecystectomy. Unenhanced pancreas, spleen and adrenal glands are unremarkable. Unenhanced kidneys are symmetrical in size. No hydronephrosis or hydroureter. No nephrolithiasis. No calcified ureteral calculi. Oral contrast material was given to the patient. There is no small bowel obstruction. No ascites or free air. No adenopathy. The terminal ileum is unremarkable. Normal appendix is partially visualized in axial image 56. The uterus is anteflexed. No adnexal mass. No distal colonic obstruction. No colitis or diverticulitis. Contrast material noted within rectum. The urinary bladder is unremarkable. IMPRESSION: 1. There is infiltrate/pneumonia in left lower lobe posterior and lateral partially visualized. Please see axial image 1. 2. Small hiatal hernia. 3. No nephrolithiasis.  No hydronephrosis or hydroureter. 4. No small bowel obstruction. 5. No pericecal inflammation. Normal appendix. Unremarkable terminal ileum. 6. No colonic obstruction.  Unremarkable uterus and adnexa. Electronically Signed   By: Lahoma Crocker M.D.   On: 03/18/2015 19:32   Dg Chest 2 View  03/18/2015  CLINICAL DATA:  Cardiac arrhythmia.  Nausea and vomiting. EXAM: CHEST  2 VIEW COMPARISON:  Aug 27, 2014 FINDINGS: There is a pacemaker with lead tips attached to the right atrium and right ventricle. No pneumothorax. There is atelectatic change in the left lower lobe. Lungs elsewhere clear. Heart size and pulmonary vascularity are normal. No adenopathy. The patient has undergone kyphoplasty procedures at several levels in the thoracic spine.  IMPRESSION: Pacemaker leads attached to right atrium right ventricle. No pneumothorax. Atelectasis left base. Lungs elsewhere clear. Electronically Signed   By: Lowella Grip III M.D.   On: 03/18/2015 20:26   I have personally reviewed and evaluated these images and lab results as part of my medical decision-making.   EKG Interpretation None      MDM   Final diagnoses:  Diarrhea, unspecified type  CAP (community acquired pneumonia)  Patient with diarrhea 3 days. Reports having multiple episodes of watery diarrhea. She is often unable to make it to the bathroom. She denies any recent antibiotic use.  Patient has some abdominal tenderness, will check CT.  CT remarkable for left lower lobe pneumonia. Patient has been coughing for the past couple of weeks.  I am concerned about C. difficile, given the frequent watery episodes diarrhea and foul smell. Also, given the patient has pneumonia, will start patient on doxycycline. Discussed this with Dr. Hal Hope and Dr. Tyrone Nine who agree with the plan.  Appreciated Dr. Hal Hope for admitting the patient.  Recommends adding blood cultures, procalcitonin, and an additional fluid bolus.  Will add flagyl.    Montine Circle, PA-C 03/18/15 2147  Deno Etienne, DO 03/18/15 [phone]

## 2015-03-18 NOTE — ED Notes (Signed)
Patient reports vomiting and diarrhea x 2 days. Patient denies any blood in  Her vomit or stool.

## 2015-03-18 NOTE — ED Notes (Signed)
I attempted to collect labs and was unsuccessful.  I made the nurse aware 

## 2015-03-18 NOTE — ED Notes (Addendum)
Pt doesn't feel able to give UA after losing so much fluid

## 2015-03-19 ENCOUNTER — Ambulatory Visit: Payer: Medicare Other | Admitting: Physical Therapy

## 2015-03-19 DIAGNOSIS — J189 Pneumonia, unspecified organism: Secondary | ICD-10-CM

## 2015-03-19 DIAGNOSIS — I456 Pre-excitation syndrome: Secondary | ICD-10-CM

## 2015-03-19 DIAGNOSIS — I429 Cardiomyopathy, unspecified: Secondary | ICD-10-CM

## 2015-03-19 DIAGNOSIS — N179 Acute kidney failure, unspecified: Secondary | ICD-10-CM | POA: Diagnosis present

## 2015-03-19 DIAGNOSIS — A419 Sepsis, unspecified organism: Secondary | ICD-10-CM | POA: Diagnosis not present

## 2015-03-19 DIAGNOSIS — R112 Nausea with vomiting, unspecified: Secondary | ICD-10-CM

## 2015-03-19 DIAGNOSIS — R197 Diarrhea, unspecified: Secondary | ICD-10-CM

## 2015-03-19 LAB — CBC WITH DIFFERENTIAL/PLATELET
Basophils Absolute: 0 10*3/uL (ref 0.0–0.1)
Basophils Relative: 0 %
EOS ABS: 0 10*3/uL (ref 0.0–0.7)
EOS PCT: 0 %
HCT: 33.4 % — ABNORMAL LOW (ref 36.0–46.0)
HEMOGLOBIN: 11.3 g/dL — AB (ref 12.0–15.0)
LYMPHS ABS: 1.1 10*3/uL (ref 0.7–4.0)
Lymphocytes Relative: 6 %
MCH: 30.6 pg (ref 26.0–34.0)
MCHC: 33.8 g/dL (ref 30.0–36.0)
MCV: 90.5 fL (ref 78.0–100.0)
MONO ABS: 1.6 10*3/uL — AB (ref 0.1–1.0)
MONOS PCT: 9 %
Neutro Abs: 15.3 10*3/uL — ABNORMAL HIGH (ref 1.7–7.7)
Neutrophils Relative %: 85 %
PLATELETS: 190 10*3/uL (ref 150–400)
RBC: 3.69 MIL/uL — ABNORMAL LOW (ref 3.87–5.11)
RDW: 12.5 % (ref 11.5–15.5)
WBC: 18 10*3/uL — ABNORMAL HIGH (ref 4.0–10.5)

## 2015-03-19 LAB — TROPONIN I
Troponin I: <0.03 ng/mL (ref <0.031)
Troponin I: <0.03 ng/mL (ref <0.031)

## 2015-03-19 LAB — URINALYSIS, ROUTINE W REFLEX MICROSCOPIC
BILIRUBIN URINE: NEGATIVE
GLUCOSE, UA: NEGATIVE mg/dL
Ketones, ur: NEGATIVE mg/dL
Nitrite: NEGATIVE
PH: 6 (ref 5.0–8.0)
Protein, ur: 30 mg/dL — AB
SPECIFIC GRAVITY, URINE: 1.018 (ref 1.005–1.030)

## 2015-03-19 LAB — COMPREHENSIVE METABOLIC PANEL
ALK PHOS: 66 U/L (ref 38–126)
ALT: 12 U/L — AB (ref 14–54)
AST: 15 U/L (ref 15–41)
Albumin: 3.3 g/dL — ABNORMAL LOW (ref 3.5–5.0)
Anion gap: 8 (ref 5–15)
BUN: 27 mg/dL — AB (ref 6–20)
CHLORIDE: 103 mmol/L (ref 101–111)
CO2: 20 mmol/L — AB (ref 22–32)
CREATININE: 1.07 mg/dL — AB (ref 0.44–1.00)
Calcium: 8.3 mg/dL — ABNORMAL LOW (ref 8.9–10.3)
GFR calc Af Amer: 60 mL/min — ABNORMAL LOW (ref >60)
GFR calc non Af Amer: 52 mL/min — ABNORMAL LOW (ref >60)
GLUCOSE: 149 mg/dL — AB (ref 65–99)
Potassium: 3 mmol/L — ABNORMAL LOW (ref 3.5–5.1)
SODIUM: 131 mmol/L — AB (ref 135–145)
Total Bilirubin: 1.1 mg/dL (ref 0.3–1.2)
Total Protein: 6.9 g/dL (ref 6.5–8.1)

## 2015-03-19 LAB — MAGNESIUM: MAGNESIUM: 1.7 mg/dL (ref 1.7–2.4)

## 2015-03-19 LAB — INFLUENZA PANEL BY PCR (TYPE A & B)
H1N1 flu by pcr: NOT DETECTED
INFLAPCR: NEGATIVE
INFLBPCR: NEGATIVE

## 2015-03-19 LAB — STREP PNEUMONIAE URINARY ANTIGEN: Strep Pneumo Urinary Antigen: NEGATIVE

## 2015-03-19 LAB — PROCALCITONIN: PROCALCITONIN: 2.62 ng/mL

## 2015-03-19 LAB — URINE MICROSCOPIC-ADD ON

## 2015-03-19 MED ORDER — CETYLPYRIDINIUM CHLORIDE 0.05 % MT LIQD
7.0000 mL | Freq: Two times a day (BID) | OROMUCOSAL | Status: DC
  Administered 2015-03-19 – 2015-03-24 (×10): 7 mL via OROMUCOSAL

## 2015-03-19 MED ORDER — DOXYCYCLINE HYCLATE 100 MG IV SOLR
100.0000 mg | Freq: Two times a day (BID) | INTRAVENOUS | Status: DC
  Administered 2015-03-19: 100 mg via INTRAVENOUS
  Filled 2015-03-19: qty 100

## 2015-03-19 MED ORDER — MORPHINE SULFATE (PF) 2 MG/ML IV SOLN
1.0000 mg | Freq: Once | INTRAVENOUS | Status: AC
  Administered 2015-03-19: 1 mg via INTRAVENOUS
  Filled 2015-03-19: qty 1

## 2015-03-19 MED ORDER — HYDRALAZINE HCL 20 MG/ML IJ SOLN: 10.0000 mg | INTRAMUSCULAR | Status: DC | PRN

## 2015-03-19 MED ORDER — SODIUM CHLORIDE 0.9 % IV SOLN
INTRAVENOUS | Status: DC
  Administered 2015-03-19 (×3): via INTRAVENOUS

## 2015-03-19 MED ORDER — DEXTROSE 5 % IV SOLN
2.0000 g | Freq: Three times a day (TID) | INTRAVENOUS | Status: DC
  Administered 2015-03-19 – 2015-03-23 (×12): 2 g via INTRAVENOUS
  Filled 2015-03-19 (×13): qty 2

## 2015-03-19 MED ORDER — HYDROCODONE-ACETAMINOPHEN 5-325 MG PO TABS
1.0000 | ORAL_TABLET | Freq: Four times a day (QID) | ORAL | Status: DC | PRN
  Administered 2015-03-19 – 2015-03-23 (×2): 1 via ORAL
  Filled 2015-03-19 (×2): qty 1

## 2015-03-19 MED ORDER — GUAIFENESIN ER 600 MG PO TB12
1200.0000 mg | ORAL_TABLET | Freq: Two times a day (BID) | ORAL | Status: DC
  Administered 2015-03-19 – 2015-03-24 (×10): 1200 mg via ORAL
  Filled 2015-03-19 (×11): qty 2

## 2015-03-19 MED ORDER — POTASSIUM CHLORIDE CRYS ER 20 MEQ PO TBCR
40.0000 meq | EXTENDED_RELEASE_TABLET | Freq: Once | ORAL | Status: AC
  Administered 2015-03-19: 40 meq via ORAL
  Filled 2015-03-19: qty 2

## 2015-03-19 MED ORDER — POTASSIUM CHLORIDE CRYS ER 20 MEQ PO TBCR: 40.0000 meq | EXTENDED_RELEASE_TABLET | Freq: Once | ORAL | Status: DC

## 2015-03-19 MED ORDER — VANCOMYCIN HCL IN DEXTROSE 750-5 MG/150ML-% IV SOLN
750.0000 mg | Freq: Two times a day (BID) | INTRAVENOUS | Status: DC
  Administered 2015-03-20 – 2015-03-21 (×4): 750 mg via INTRAVENOUS
  Filled 2015-03-19 (×5): qty 150

## 2015-03-19 MED ORDER — VANCOMYCIN HCL 10 G IV SOLR
1500.0000 mg | Freq: Once | INTRAVENOUS | Status: AC
  Administered 2015-03-19: 1500 mg via INTRAVENOUS
  Filled 2015-03-19: qty 1500

## 2015-03-19 MED ORDER — POTASSIUM CHLORIDE 10 MEQ/100ML IV SOLN
10.0000 meq | INTRAVENOUS | Status: AC
  Administered 2015-03-19 (×2): 10 meq via INTRAVENOUS
  Filled 2015-03-19 (×2): qty 100

## 2015-03-19 MED ORDER — SODIUM CHLORIDE 0.9 % IV BOLUS (SEPSIS)
500.0000 mL | Freq: Once | INTRAVENOUS | Status: AC
  Administered 2015-03-19: 500 mL via INTRAVENOUS

## 2015-03-19 MED ORDER — MORPHINE SULFATE (PF) 2 MG/ML IV SOLN
2.0000 mg | Freq: Once | INTRAVENOUS | Status: AC
Start: 2015-03-19 — End: 2015-03-19
  Administered 2015-03-19: 2 mg via INTRAVENOUS
  Filled 2015-03-19: qty 1

## 2015-03-19 MED ORDER — GUAIFENESIN-DM 100-10 MG/5ML PO SYRP: 5.0000 mL | ORAL_SOLUTION | ORAL | Status: DC | PRN

## 2015-03-19 MED ORDER — GI COCKTAIL ~~LOC~~
30.0000 mL | Freq: Once | ORAL | Status: AC
  Administered 2015-03-19: 30 mL via ORAL
  Filled 2015-03-19: qty 30

## 2015-03-19 MED ORDER — MAGNESIUM SULFATE 2 GM/50ML IV SOLN
2.0000 g | Freq: Once | INTRAVENOUS | Status: AC
  Administered 2015-03-19: 2 g via INTRAVENOUS
  Filled 2015-03-19: qty 50

## 2015-03-19 NOTE — Progress Notes (Signed)
Report received from C. Addison, RN. Will continue to monitor pt closely and carry out plan of care. Carnella [place] I

## 2015-03-19 NOTE — Progress Notes (Signed)
Patient c/o of sharp chest pain 10/10 and SOB. This writer asked where the pain was located the patient pointed to under her breast and left side of neck. Resp 28, temp 103.3 rectally, BP 104/74, HR 110,. Patient placed of [place] 2L sats 96%. Ice packs applied and pt given incentive spirometer. NP on call notified. No New orders placed.

## 2015-03-19 NOTE — Progress Notes (Signed)
Utilization review completed.  

## 2015-03-19 NOTE — Progress Notes (Signed)
ANTIBIOTIC CONSULT NOTE - INITIAL  Pharmacy Consult for vancomycin and aztreonam Indication: sepsis  Patient Measurements: Height: 5' 3.5" (161.3 cm) Weight: 207 lb 0.2 oz (93.9 kg) IBW/kg (Calculated) : 53.55   Vital Signs: Temp: 98.6 F (37 C) (12/08 0415) Temp Source: Rectal (12/08 0415) BP: 124/60 mmHg (12/08 0800) Pulse Rate: 80 (12/08 0800) Intake/Output from previous day: 12/07 0701 - 12/08 0700 In: 325 [I.V.:125; IV Piggyback:200] Out: 150 [Urine:150] Intake/Output from this shift:    Labs:  Recent Labs  03/18/15 1610 03/19/15 0415  WBC 23.9* 18.0*  HGB 12.6 11.3*  PLT 197 190  CREATININE 1.28* 1.07*   Estimated Creatinine Clearance: 54.6 mL/min (by C-G formula based on Cr of 1.07). No results for input(s): VANCOTROUGH, VANCOPEAK, VANCORANDOM, GENTTROUGH, GENTPEAK, GENTRANDOM, TOBRATROUGH, TOBRAPEAK, TOBRARND, AMIKACINPEAK, AMIKACINTROU, AMIKACIN in the last 72 hours.   Microbiology: Recent Results (from the past 720 hour(s))  C difficile quick scan w PCR reflex     Status: None   Collection Time: 03/18/15  9:21 PM  Result Value Ref Range Status   C Diff antigen NEGATIVE NEGATIVE Final   C Diff toxin NEGATIVE NEGATIVE Final   C Diff interpretation Negative for toxigenic C. difficile  Final    Medical History: Past Medical History  Diagnosis Date  . Asthma   . GERD (gastroesophageal reflux disease)   . Hypertension   . ICD (implantable cardiac defibrillator) in place Dec 2013  . Shortness of breath     "@ any time I can get SOB" (03/22/2012)  . Migraines   . Skin cancer 1999  . WPW (Wolff-Parkinson-White syndrome)   . Pacemaker   . CHF (congestive heart failure) ([place]) 6/13  . Pneumonia     hx  . Anxiety   . NICM (nonischemic cardiomyopathy) ([place])   . Obesity (BMI 30-39.9)     negative sleep study 2014  . Head injury, acute, with loss of consciousness ([place])   . Depression   . Arthritis     "hands and knees" (03/22/2012)     Assessment: 69 y.o. Female with history of nonischemic cardiomyopathy last use admission was 20-25%, chronic LBBB, history of WPW syndrome, hypertension presents to the ER because of persistent nausea vomiting and diarrhea over the last 3 days.  This morning she developed  left-sided chest pain, fever and chills, SOB, cough and sputum production.  Pharmacy consulted to dose vancomycin and aztreonam for sepsis.  Goal of Therapy:  Vancomycin trough level 15-20 mcg/ml  Plan:  Vancomycin 1500mg  IV x 1 then 750mg  IV q12h Aztreonam 2gm IV q8h Follow renal function, cultures, clinical course   Dolly Rias RPh 03/19/2015, 10:41 AM Pager [phone]

## 2015-03-19 NOTE — Progress Notes (Signed)
Initial Nutrition Assessment  DOCUMENTATION CODES:   Obesity unspecified  INTERVENTION:  - Diet advancement as medically feasible - RD will continue to monitor for needs  NUTRITION DIAGNOSIS:   Inadequate oral intake related to acute illness, nausea, vomiting as evidenced by per patient/family report.  GOAL:   Patient will meet greater than or equal to 90% of their needs  MONITOR:   PO intake, Weight trends, Labs, I & O's  REASON FOR ASSESSMENT:   Malnutrition Screening Tool  ASSESSMENT:   Patient with PMH of WPW, pacemaker presents to the ED with a chief complaint nausea, vomiting, and diarrhea. States that she has been having the symptoms for the past 2 days. Reports that her symptoms are severe. She states that she has felt very fatigued. She denies any recent fevers or chills. Reports having central abdominal pain. Denies prior abdominal surgeries. Denies recent travel. Denies recent antibiotics.  Pt seen for MST. BMI indicates obesity. No intakes documented and pt denies consuming anything for breakfast this AM. She states she typically has a good appetite but that she started feeling unwell Sunday (12/4) PM. She got a bagel and cup of coffee from [employer] while driving home from an outing because she was feeling weak. Since that time she has felt progressively worse. She states she had a Big Mac for lunch on Monday and on Tuesday morning she tried to eat oatmeal but felt very nauseated and has not eaten since that time. She states ongoing nausea, vomiting, and abdominal pain even without PO intakes. She states that nausea has began to resolve with medication and she has had no emesis this AM.  Pt reports UBW of 208 lbs but states she is unsure of current weight as she has not weighed herself since acute illness began. Chart review indicates stable weight (200-209 lbs) since 06/03/14 and current weight consistent with reported UBW. No muscle or fat wasting noted during  physical assessment.  Pt not meeting needs for ~4 days PTA. Medications reviewed. Labs reviewed; Na: 131 mmol/L, K: 3 mmol/L, BUN/creatinine elevated, Ca: 8.3 mg/dL, GFR: 52.   Diet Order:  Diet full liquid Room service appropriate?: Yes; Fluid consistency:: Thin  Skin:  Reviewed, no issues  Last BM:  PTA  Height:   Ht Readings from Last 1 Encounters:  03/18/15 5' 3.5" (1.613 m)    Weight:   Wt Readings from Last 1 Encounters:  03/18/15 207 lb 0.2 oz (93.9 kg)    Ideal Body Weight:  53.41 kg (kg)  BMI:  Body mass index is 36.09 kg/(m^2).  Estimated Nutritional Needs:   Kcal:  1410-1600  Protein:  60-70 grams  Fluid:  >/= 2.2 L/day  EDUCATION NEEDS:   No education needs identified at this time     Jarome Matin, RD, LDN Inpatient Clinical Dietitian Pager # [phone] After hours/weekend pager # [phone]

## 2015-03-19 NOTE — Progress Notes (Signed)
[facility] PROGRESS NOTE   Paula Booker CBJ:[id number] DOB: 1945/07/20 DOA: 03/18/2015 PCP: Antony Blackbird, MD  HPI/Subjective: Continues to have left-sided chest pain. The fever and chills. Patient still has some shortness of breath, cough and sputum production.  Assessment/Plan: Principal Problem:   Sepsis ([place]) Active Problems:   Type B WPW syndrome- RFA Dec 2013   NICM (nonischemic cardiomyopathy) ([place])   Diarrhea   CAP (community acquired pneumonia)   Nausea vomiting and diarrhea   Acute kidney injury ([place])    Sepsis Patient met sepsis criteria with fever 103.3, respiratory rate of 28, heart rate of 110 and presence of pneumonia. Started on antibiotics for pneumonia. IV fluid for hydration, discontinued because of history of NICM.  CAP Lobar pneumonia involving the left lower lobe seen on the CT scan. Patient presented with left-sided pleuritic chest pain, fever, SOB, cough and sputum production. Started on antibiotics, continued. Supportive management with bronchodilators, mucolytics, antitussives and oxygen as needed. Negative flu PCR  Acute kidney injury Baseline creatinine 0.9, presented with creatinine of 1.28 with increase BUN. This is likely secondary to dehydration from sepsis/fever, improved after hydration with IV fluids. Check BMP in a.m.  Hypokalemia Replete with oral supplements  Nonischemic cardiomyopathy Patient has cardiomyopathy with LVEF of 20-25%, no symptoms of decompensation. Patient received boluses of 3 L of normal saline yesterday, IV fluids for only 24 hours.  Nausea and vomiting Symptomatic management, CT scan of abdomen pelvis no acute abnormalities, only old left lower lobe pneumonia.   Code Status: DNR Family Communication: Plan discussed with the patient. Disposition Plan: Remains inpatient Diet: Diet full liquid Room service appropriate?: Yes; Fluid consistency::  Thin  Consultants:  None  Procedures:  None  Antibiotics:  Rocephin (indicate start date, and stop date if known)   Objective: Filed Vitals:   03/19/15 0415 03/19/15 0800  BP:  124/60  Pulse:  80  Temp: 98.6 F (37 C)   Resp:      Intake/Output Summary (Last 24 hours) at 03/19/15 1014 Last data filed at 03/19/15 0700  Gross per 24 hour  Intake    325 ml  Output    150 ml  Net    175 ml   Filed Weights   03/18/15 2303  Weight: 93.9 kg (207 lb 0.2 oz)    Exam: General: Alert and awake, oriented x3, not in any acute distress. HEENT: anicteric sclera, pupils reactive to light and accommodation, EOMI CVS: S1-S2 clear, no murmur rubs or gallops Chest: clear to auscultation bilaterally, no wheezing, rales or rhonchi Abdomen: soft nontender, nondistended, normal bowel sounds, no organomegaly Extremities: no cyanosis, clubbing or edema noted bilaterally Neuro: Cranial nerves II-XII intact, no focal neurological deficits  Data Reviewed: Basic Metabolic Panel:  Recent Labs Lab 03/18/15 1610 03/19/15 0415  NA 130* 131*  K 3.3* 3.0*  CL 99* 103  CO2 20* 20*  GLUCOSE 156* 149*  BUN 30* 27*  CREATININE 1.28* 1.07*  CALCIUM 8.9 8.3*  MG  --  1.7   Liver Function Tests:  Recent Labs Lab 03/18/15 1610 03/19/15 0415  AST 18 15  ALT 14 12*  ALKPHOS 71 66  BILITOT 1.3* 1.1  PROT 7.5 6.9  ALBUMIN 3.7 3.3*    Recent Labs Lab 03/18/15 1610  LIPASE 22   No results for input(s): AMMONIA in the last 168 hours. CBC:  Recent Labs Lab 03/18/15 1610 03/19/15 0415  WBC 23.9* 18.0*  NEUTROABS  --  15.3*  HGB 12.6 11.3*  HCT  37.9 33.4*  MCV 91.5 90.5  PLT 197 190   Cardiac Enzymes:  Recent Labs Lab 03/19/15 0415  TROPONINI <0.03   BNP (last 3 results)  Recent Labs  08/27/14 0221  BNP 48.5    ProBNP (last 3 results)  Recent Labs  10/08/14 1613  PROBNP 52.0    CBG: No results for input(s): GLUCAP in the last 168  hours.  Micro Recent Results (from the past 240 hour(s))  C difficile quick scan w PCR reflex     Status: None   Collection Time: 03/18/15  9:21 PM  Result Value Ref Range Status   C Diff antigen NEGATIVE NEGATIVE Final   C Diff toxin NEGATIVE NEGATIVE Final   C Diff interpretation Negative for toxigenic C. difficile  Final     Studies: Ct Abdomen Pelvis Wo Contrast  03/18/2015  CLINICAL DATA:  Vomiting, diarrhea for 2 days EXAM: CT ABDOMEN AND PELVIS WITHOUT CONTRAST TECHNIQUE: Multidetector CT imaging of the abdomen and pelvis was performed following the standard protocol without IV contrast. COMPARISON:  01/27/2014 FINDINGS: There are streaky artifacts from patient's large body habitus. Partially visualized cardiac pacemaker lead in right ventricle. Sagittal images of the spine shows prior vertebroplasty lower thoracic spine at T11 and T9 level. Small hiatal hernia is noted. There is partially visualized infiltrate/pneumonia in left lower lobe posterior laterally. Please see axial image 1. Unenhanced liver shows no biliary ductal dilatation. Status postcholecystectomy. Unenhanced pancreas, spleen and adrenal glands are unremarkable. Unenhanced kidneys are symmetrical in size. No hydronephrosis or hydroureter. No nephrolithiasis. No calcified ureteral calculi. Oral contrast material was given to the patient. There is no small bowel obstruction. No ascites or free air. No adenopathy. The terminal ileum is unremarkable. Normal appendix is partially visualized in axial image 56. The uterus is anteflexed. No adnexal mass. No distal colonic obstruction. No colitis or diverticulitis. Contrast material noted within rectum. The urinary bladder is unremarkable. IMPRESSION: 1. There is infiltrate/pneumonia in left lower lobe posterior and lateral partially visualized. Please see axial image 1. 2. Small hiatal hernia. 3. No nephrolithiasis.  No hydronephrosis or hydroureter. 4. No small bowel obstruction. 5. No  pericecal inflammation. Normal appendix. Unremarkable terminal ileum. 6. No colonic obstruction.  Unremarkable uterus and adnexa. Electronically Signed   By: Lahoma Crocker M.D.   On: 03/18/2015 19:32   Dg Chest 2 View  03/18/2015  CLINICAL DATA:  Cardiac arrhythmia.  Nausea and vomiting. EXAM: CHEST  2 VIEW COMPARISON:  Aug 27, 2014 FINDINGS: There is a pacemaker with lead tips attached to the right atrium and right ventricle. No pneumothorax. There is atelectatic change in the left lower lobe. Lungs elsewhere clear. Heart size and pulmonary vascularity are normal. No adenopathy. The patient has undergone kyphoplasty procedures at several levels in the thoracic spine. IMPRESSION: Pacemaker leads attached to right atrium right ventricle. No pneumothorax. Atelectasis left base. Lungs elsewhere clear. Electronically Signed   By: Lowella Grip III M.D.   On: 03/18/2015 20:26    Scheduled Meds: . antiseptic oral rinse  7 mL Mouth Rinse BID  . carvedilol  12.5 mg Oral BID WC  . cholecalciferol  1,000 Units Oral Daily  . citalopram  20 mg Oral Daily  . doxycycline (VIBRAMYCIN) IV  100 mg Intravenous Q12H  . enoxaparin (LOVENOX) injection  40 mg Subcutaneous QHS  . loratadine  10 mg Oral Daily  . metronidazole  500 mg Intravenous Q8H  . pantoprazole  40 mg Oral Daily  . potassium chloride  40 mEq Oral Once   Continuous Infusions: . sodium chloride 100 mL/hr at 03/19/15 0729       Time spent: 35 minutes    [facility]  [facility] Pager [phone] If 7PM-7AM, please contact night-coverage at [email or web address], password [facility] 03/19/2015, 10:14 AM  LOS: 1 day

## 2015-03-20 LAB — BASIC METABOLIC PANEL
Anion gap: 4 — ABNORMAL LOW (ref 5–15)
BUN: 16 mg/dL (ref 6–20)
CALCIUM: 7.9 mg/dL — AB (ref 8.9–10.3)
CHLORIDE: 109 mmol/L (ref 101–111)
CO2: 23 mmol/L (ref 22–32)
CREATININE: 0.83 mg/dL (ref 0.44–1.00)
GFR calc non Af Amer: >60 mL/min (ref >60)
Glucose, Bld: 106 mg/dL — ABNORMAL HIGH (ref 65–99)
Potassium: 4.6 mmol/L (ref 3.5–5.1)
SODIUM: 136 mmol/L (ref 135–145)

## 2015-03-20 LAB — LEGIONELLA ANTIGEN, URINE

## 2015-03-20 LAB — HIV ANTIBODY (ROUTINE TESTING W REFLEX): HIV SCREEN 4TH GENERATION: NONREACTIVE

## 2015-03-20 LAB — CBC
HEMATOCRIT: 30.1 % — AB (ref 36.0–46.0)
Hemoglobin: 10 g/dL — ABNORMAL LOW (ref 12.0–15.0)
MCH: 30.3 pg (ref 26.0–34.0)
MCHC: 33.2 g/dL (ref 30.0–36.0)
MCV: 91.2 fL (ref 78.0–100.0)
Platelets: 185 10*3/uL (ref 150–400)
RBC: 3.3 MIL/uL — AB (ref 3.87–5.11)
RDW: 12.8 % (ref 11.5–15.5)
WBC: 13 10*3/uL — AB (ref 4.0–10.5)

## 2015-03-20 LAB — PROCALCITONIN: Procalcitonin: 2.02 ng/mL

## 2015-03-20 LAB — MAGNESIUM: MAGNESIUM: 2.5 mg/dL — AB (ref 1.7–2.4)

## 2015-03-20 MED ORDER — LORAZEPAM 0.5 MG PO TABS
0.5000 mg | ORAL_TABLET | Freq: Once | ORAL | Status: AC
  Administered 2015-03-20: 0.5 mg via ORAL
  Filled 2015-03-20: qty 1

## 2015-03-20 NOTE — Progress Notes (Signed)
[facility] PROGRESS NOTE   Paula Booker OHY:[id number] DOB: 24-May-1945 DOA: 03/18/2015 PCP: Antony Blackbird, MD  HPI/Subjective: Has diarrhea, continues to have some chest pain.  Assessment/Plan: Principal Problem:   Sepsis ([place]) Active Problems:   Type B WPW syndrome- RFA Dec 2013   NICM (nonischemic cardiomyopathy) ([place])   Diarrhea   CAP (community acquired pneumonia)   Nausea vomiting and diarrhea   Acute kidney injury ([place])    Sepsis Patient met sepsis criteria with fever 103.3, respiratory rate of 28, heart rate of 110 and presence of pneumonia. Started on antibiotics for pneumonia. IV fluid for hydration, discontinued because of history of NICM.  CAP Lobar pneumonia involving the left lower lobe seen on the CT scan. Patient presented with left-sided pleuritic chest pain, fever, SOB, cough and sputum production. Started on antibiotics, continued. Supportive management with bronchodilators, mucolytics, antitussives and oxygen as needed. Negative flu PCR  UTI Urine culture showing gram negative rods, on aztreonam.  Acute kidney injury Baseline creatinine 0.9, presented with creatinine of 1.28 with increase BUN. This is likely secondary to dehydration from sepsis/fever, resolved after hydration with IV fluids. D/C IVF.  Hypokalemia Replete with oral supplements  Nonischemic cardiomyopathy Patient has cardiomyopathy with LVEF of 20-25%, no symptoms of decompensation. Patient received boluses of 3 L of normal saline yesterday, IV fluids for only 24 hours.  Nausea and vomiting Symptomatic management, CT scan of abdomen pelvis no acute abnormalities, only old left lower lobe pneumonia.  GPC bacteremia 1/2 will continue to monitor, on vancomycin laready  Code Status: DNR Family Communication: Plan discussed with the patient. Disposition Plan: Remains inpatient Diet: Diet Heart Room service appropriate?: Yes; Fluid consistency::  Thin  Consultants:  None  Procedures:  None  Antibiotics:  Rocephin (indicate start date, and stop date if known)   Objective: Filed Vitals:   03/20/15 0736 03/20/15 1416  BP: 118/54 99/75  Pulse: 75 79  Temp:  99 F (37.2 C)  Resp:  19    Intake/Output Summary (Last 24 hours) at 03/20/15 1715 Last data filed at 03/20/15 1417  Gross per 24 hour  Intake   2390 ml  Output    400 ml  Net   1990 ml   Filed Weights   03/18/15 2303  Weight: 93.9 kg (207 lb 0.2 oz)    Exam: General: Alert and awake, oriented x3, not in any acute distress. HEENT: anicteric sclera, pupils reactive to light and accommodation, EOMI CVS: S1-S2 clear, no murmur rubs or gallops Chest: clear to auscultation bilaterally, no wheezing, rales or rhonchi Abdomen: soft nontender, nondistended, normal bowel sounds, no organomegaly Extremities: no cyanosis, clubbing or edema noted bilaterally Neuro: Cranial nerves II-XII intact, no focal neurological deficits  Data Reviewed: Basic Metabolic Panel:  Recent Labs Lab 03/18/15 1610 03/19/15 0415 03/20/15 0453  NA 130* 131* 136  K 3.3* 3.0* 4.6  CL 99* 103 109  CO2 20* 20* 23  GLUCOSE 156* 149* 106*  BUN 30* 27* 16  CREATININE 1.28* 1.07* 0.83  CALCIUM 8.9 8.3* 7.9*  MG  --  1.7 2.5*   Liver Function Tests:  Recent Labs Lab 03/18/15 1610 03/19/15 0415  AST 18 15  ALT 14 12*  ALKPHOS 71 66  BILITOT 1.3* 1.1  PROT 7.5 6.9  ALBUMIN 3.7 3.3*    Recent Labs Lab 03/18/15 1610  LIPASE 22   No results for input(s): AMMONIA in the last 168 hours. CBC:  Recent Labs Lab 03/18/15 1610 03/19/15 0415 03/20/15 0453  WBC 23.9* 18.0* 13.0*  NEUTROABS  --  15.3*  --   HGB 12.6 11.3* 10.0*  HCT 37.9 33.4* 30.1*  MCV 91.5 90.5 91.2  PLT 197 190 185   Cardiac Enzymes:  Recent Labs Lab 03/19/15 0415 03/19/15 1030 03/19/15 1649  TROPONINI <0.03 <0.03 <0.03   BNP (last 3 results)  Recent Labs  08/27/14 0221  BNP 48.5     ProBNP (last 3 results)  Recent Labs  10/08/14 1613  PROBNP 52.0    CBG: No results for input(s): GLUCAP in the last 168 hours.  Micro Recent Results (from the past 240 hour(s))  C difficile quick scan w PCR reflex     Status: None   Collection Time: 03/18/15  9:21 PM  Result Value Ref Range Status   C Diff antigen NEGATIVE NEGATIVE Final   C Diff toxin NEGATIVE NEGATIVE Final   C Diff interpretation Negative for toxigenic C. difficile  Final  Blood culture (routine x 2)     Status: None (Preliminary result)   Collection Time: 03/18/15 10:04 PM  Result Value Ref Range Status   Specimen Description BLOOD LEFT ARM  Final   Special Requests BOTTLES DRAWN AEROBIC AND ANAEROBIC 3CC  Final   Culture  Setup Time   Final    GRAM POSITIVE COCCI IN CLUSTERS ANAEROBIC BOTTLE ONLY CRITICAL RESULT CALLED TO, READ BACK BY AND VERIFIED WITH: SONYA '@[id number]'  03/20/15 MKELLY    Culture   Final    NO GROWTH 1 DAY Performed at [facility]    Report Status PENDING  Incomplete  Blood culture (routine x 2)     Status: None (Preliminary result)   Collection Time: 03/18/15 11:12 PM  Result Value Ref Range Status   Specimen Description BLOOD RIGHT ANTECUBITAL  Final   Special Requests BOTTLES DRAWN AEROBIC AND ANAEROBIC 5ML  Final   Culture   Final    NO GROWTH 1 DAY Performed at [facility]    Report Status PENDING  Incomplete  Urine culture     Status: None (Preliminary result)   Collection Time: 03/19/15  2:30 PM  Result Value Ref Range Status   Specimen Description URINE, CLEAN CATCH  Final   Special Requests NONE  Final   Culture   Final    80,000 COLONIES/ml GRAM NEGATIVE RODS Performed at [facility]    Report Status PENDING  Incomplete     Studies: Ct Abdomen Pelvis Wo Contrast  03/18/2015  CLINICAL DATA:  Vomiting, diarrhea for 2 days EXAM: CT ABDOMEN AND PELVIS WITHOUT CONTRAST TECHNIQUE: Multidetector CT imaging of the abdomen and pelvis was  performed following the standard protocol without IV contrast. COMPARISON:  01/27/2014 FINDINGS: There are streaky artifacts from patient's large body habitus. Partially visualized cardiac pacemaker lead in right ventricle. Sagittal images of the spine shows prior vertebroplasty lower thoracic spine at T11 and T9 level. Small hiatal hernia is noted. There is partially visualized infiltrate/pneumonia in left lower lobe posterior laterally. Please see axial image 1. Unenhanced liver shows no biliary ductal dilatation. Status postcholecystectomy. Unenhanced pancreas, spleen and adrenal glands are unremarkable. Unenhanced kidneys are symmetrical in size. No hydronephrosis or hydroureter. No nephrolithiasis. No calcified ureteral calculi. Oral contrast material was given to the patient. There is no small bowel obstruction. No ascites or free air. No adenopathy. The terminal ileum is unremarkable. Normal appendix is partially visualized in axial image 56. The uterus is anteflexed. No adnexal mass. No distal colonic obstruction. No colitis  or diverticulitis. Contrast material noted within rectum. The urinary bladder is unremarkable. IMPRESSION: 1. There is infiltrate/pneumonia in left lower lobe posterior and lateral partially visualized. Please see axial image 1. 2. Small hiatal hernia. 3. No nephrolithiasis.  No hydronephrosis or hydroureter. 4. No small bowel obstruction. 5. No pericecal inflammation. Normal appendix. Unremarkable terminal ileum. 6. No colonic obstruction.  Unremarkable uterus and adnexa. Electronically Signed   By: Lahoma Crocker M.D.   On: 03/18/2015 19:32   Dg Chest 2 View  03/18/2015  CLINICAL DATA:  Cardiac arrhythmia.  Nausea and vomiting. EXAM: CHEST  2 VIEW COMPARISON:  Aug 27, 2014 FINDINGS: There is a pacemaker with lead tips attached to the right atrium and right ventricle. No pneumothorax. There is atelectatic change in the left lower lobe. Lungs elsewhere clear. Heart size and pulmonary  vascularity are normal. No adenopathy. The patient has undergone kyphoplasty procedures at several levels in the thoracic spine. IMPRESSION: Pacemaker leads attached to right atrium right ventricle. No pneumothorax. Atelectasis left base. Lungs elsewhere clear. Electronically Signed   By: Lowella Grip III M.D.   On: 03/18/2015 20:26    Scheduled Meds: . antiseptic oral rinse  7 mL Mouth Rinse BID  . aztreonam  2 g Intravenous Q8H  . carvedilol  12.5 mg Oral BID WC  . cholecalciferol  1,000 Units Oral Daily  . citalopram  20 mg Oral Daily  . enoxaparin (LOVENOX) injection  40 mg Subcutaneous QHS  . guaiFENesin  1,200 mg Oral BID  . loratadine  10 mg Oral Daily  . pantoprazole  40 mg Oral Daily  . vancomycin  750 mg Intravenous Q12H   Continuous Infusions:       Time spent: 35 minutes    [facility]  [facility] Pager [phone] If 7PM-7AM, please contact night-coverage at [email or web address], password [facility] 03/20/2015, 5:15 PM  LOS: 2 days

## 2015-03-20 NOTE — Progress Notes (Signed)
CRITICAL VALUE ALERT  Critical value received:  Gram + cocci in  Date of notification:  03/20/2015  Time of notification:  0219  Critical value read back:yes  Nurse who received alert:  Davy Pique  MD notified (1st page): L. [place]uk  Time of first page:  0220  MD notified (2nd page):  Time of second page:  Responding MD:  Roger Shelter  Time MD responded:  [id number]

## 2015-03-21 LAB — URINE CULTURE

## 2015-03-21 LAB — GI PATHOGEN PANEL BY PCR, STOOL
C difficile toxin A/B: NOT DETECTED
CRYPTOSPORIDIUM BY PCR: NOT DETECTED
Campylobacter by PCR: NOT DETECTED
E COLI (ETEC) LT/ST: NOT DETECTED
E COLI 0157 BY PCR: NOT DETECTED
E coli (STEC): NOT DETECTED
G LAMBLIA BY PCR: NOT DETECTED
Norovirus GI/GII: NOT DETECTED
Rotavirus A by PCR: NOT DETECTED
Salmonella by PCR: NOT DETECTED
Shigella by PCR: NOT DETECTED

## 2015-03-21 LAB — BASIC METABOLIC PANEL
Anion gap: 4 — ABNORMAL LOW (ref 5–15)
BUN: 8 mg/dL (ref 6–20)
CHLORIDE: 107 mmol/L (ref 101–111)
CO2: 28 mmol/L (ref 22–32)
CREATININE: 0.64 mg/dL (ref 0.44–1.00)
Calcium: 8.5 mg/dL — ABNORMAL LOW (ref 8.9–10.3)
GLUCOSE: 107 mg/dL — AB (ref 65–99)
Potassium: 4.4 mmol/L (ref 3.5–5.1)
Sodium: 139 mmol/L (ref 135–145)

## 2015-03-21 NOTE — Progress Notes (Signed)
[facility] PROGRESS NOTE   Paula Booker EHM:[id number] DOB: 10-26-1945 DOA: 03/18/2015 PCP: Antony Blackbird, MD  HPI/Subjective: Feels much better this morning, was to get up and walk around.  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 Started on antibiotics for pneumonia. IV fluid for hydration, discontinued because of history of NICM. Sepsis physiology resolved.  CAP Lobar pneumonia involving the left lower lobe seen on the CT scan. Patient presented with left-sided pleuritic chest pain, fever, SOB, cough and sputum production. Started on antibiotics, continued. Supportive management with bronchodilators, mucolytics, antitussives and oxygen as needed. Negative flu PCR  UTI Urine culture showing gram negative rods, on aztreonam.  Acute kidney injury Baseline creatinine 0.9, presented with creatinine of 1.28 with increase BUN. This is likely secondary to dehydration from sepsis/fever, resolved after hydration with IV fluids. D/C IVF.  Hypokalemia Repleted with oral supplements  Nonischemic cardiomyopathy Patient has cardiomyopathy with LVEF of 20-25%, no symptoms of decompensation. Patient received boluses of 3 L of normal saline yesterday, IV fluids for only 24 hours. IV fluids discontinued.  Nausea and vomiting Symptomatic management, CT scan of abdomen pelvis no acute abnormalities, only left lower lobe pneumonia.  Coagulase negative staph species bacteremia 1/2 this is likely contamination, anyway patient is on vancomycin.  Code Status: DNR Family Communication: Plan discussed with the patient. Disposition Plan: PT/OT,  likely to be discharged on Monday. Diet: Diet Heart Room service appropriate?: Yes; Fluid consistency:: Thin  Consultants:  None  Procedures:  None  Antibiotics:  Aztreonam and vancomycin to   Objective: Filed Vitals:   03/21/15 0412 03/21/15 0747  BP: 116/59   Pulse: 79 80  Temp: 98.7 F (37.1 C)   Resp: 18     Intake/Output Summary (Last 24 hours) at 03/21/15 1033 Last data filed at 03/21/15 0920  Gross per 24 hour  Intake   1010 ml  Output      0 ml  Net   1010 ml   Filed Weights   03/18/15 2303  Weight: 93.9 kg (207 lb 0.2 oz)    Exam: General: Alert and awake, oriented x3, not in any acute distress. HEENT: anicteric sclera, pupils reactive to light and accommodation, EOMI CVS: S1-S2 clear, no murmur rubs or gallops Chest: clear to auscultation bilaterally, no wheezing, rales or rhonchi Abdomen: soft nontender, nondistended, normal bowel sounds, no organomegaly Extremities: no cyanosis, clubbing or edema noted bilaterally Neuro: Cranial nerves II-XII intact, no focal neurological deficits  Data Reviewed: Basic Metabolic Panel:  Recent Labs Lab 03/18/15 1610 03/19/15 0415 03/20/15 0453 03/21/15 0536  NA 130* 131* 136 139  K 3.3* 3.0* 4.6 4.4  CL 99* 103 109 107  CO2 20* 20* 23 28  GLUCOSE 156* 149* 106* 107*  BUN 30* 27* 16 8  CREATININE 1.28* 1.07* 0.83 0.64  CALCIUM 8.9 8.3* 7.9* 8.5*  MG  --  1.7 2.5*  --    Liver Function Tests:  Recent Labs Lab 03/18/15 1610 03/19/15 0415  AST 18 15  ALT 14 12*  ALKPHOS 71 66  BILITOT 1.3* 1.1  PROT 7.5 6.9  ALBUMIN 3.7 3.3*    Recent Labs Lab  03/18/15 1610  LIPASE 22   No results for input(s): AMMONIA in the last 168 hours. CBC:  Recent Labs Lab 03/18/15 1610 03/19/15 0415 03/20/15 0453  WBC 23.9* 18.0* 13.0*  NEUTROABS  --  15.3*  --   HGB 12.6 11.3* 10.0*  HCT 37.9 33.4* 30.1*  MCV 91.5 90.5 91.2  PLT 197 190 185   Cardiac Enzymes:  Recent Labs Lab 03/19/15 0415  03/19/15 1030 03/19/15 1649  TROPONINI <0.03 <0.03 <0.03   BNP (last 3 results)  Recent Labs  08/27/14 0221  BNP 48.5    ProBNP (last 3 results)  Recent Labs  10/08/14 1613  PROBNP 52.0    CBG: No results for input(s): GLUCAP in the last 168 hours.  Micro Recent Results (from the past 240 hour(s))  C difficile quick scan w PCR reflex     Status: None   Collection Time: 03/18/15  9:21 PM  Result Value Ref Range Status   C Diff antigen NEGATIVE NEGATIVE Final   C Diff toxin NEGATIVE NEGATIVE Final   C Diff interpretation Negative for toxigenic C. difficile  Final  Blood culture (routine x 2)     Status: None (Preliminary result)   Collection Time: 03/18/15 10:04 PM  Result Value Ref Range Status   Specimen Description BLOOD LEFT ARM  Final   Special Requests BOTTLES DRAWN AEROBIC AND ANAEROBIC 3CC  Final   Culture  Setup Time   Final    GRAM POSITIVE COCCI IN CLUSTERS ANAEROBIC BOTTLE ONLY CRITICAL RESULT CALLED TO, READ BACK BY AND VERIFIED WITH: SONYA '@0218'  03/20/15 MKELLY    Culture   Final    STAPHYLOCOCCUS SPECIES (COAGULASE NEGATIVE) THE SIGNIFICANCE OF ISOLATING THIS ORGANISM FROM A SINGLE SET OF BLOOD CULTURES WHEN MULTIPLE SETS ARE DRAWN IS UNCERTAIN. PLEASE NOTIFY THE MICROBIOLOGY DEPARTMENT WITHIN ONE WEEK IF SPECIATION AND SENSITIVITIES ARE REQUIRED. Performed at [facility]    Report Status PENDING  Incomplete  Blood culture (routine x 2)     Status: None (Preliminary result)   Collection Time: 03/18/15 11:12 PM  Result Value Ref Range Status   Specimen Description BLOOD RIGHT ANTECUBITAL  Final   Special Requests BOTTLES DRAWN AEROBIC AND ANAEROBIC 5ML  Final   Culture   Final    NO GROWTH 1 DAY Performed at [facility]    Report Status PENDING  Incomplete  Urine culture     Status: None (Preliminary result)   Collection Time: 03/19/15  2:30 PM  Result Value Ref Range Status   Specimen Description URINE, CLEAN CATCH  Final    Special Requests NONE  Final   Culture   Final    80,000 COLONIES/ml GRAM NEGATIVE RODS Performed at [facility]    Report Status PENDING  Incomplete     Studies: No results found.  Scheduled Meds: . antiseptic oral rinse  7 mL Mouth Rinse BID  . aztreonam  2 g Intravenous Q8H  . carvedilol  12.5 mg Oral BID WC  . cholecalciferol  1,000 Units Oral Daily  . citalopram  20 mg Oral Daily  . enoxaparin (LOVENOX) injection  40 mg Subcutaneous QHS  . guaiFENesin  1,200 mg Oral BID  . loratadine  10 mg Oral Daily  . pantoprazole  40 mg Oral Daily  . vancomycin  750 mg Intravenous Q12H   Continuous Infusions:       Time spent: 35 minutes    [facility] Pager [phone] If 7PM-7AM,  please contact night-coverage at [email or web address], password [facility] 03/21/2015, 10:33 AM  LOS: 3 days

## 2015-03-21 NOTE — Progress Notes (Signed)
Pharmacy Antibiotic Follow-up Note  Paula Booker is a 69 y.o. year-old female admitted on 03/18/2015.  The patient is currently on day 3 of vancomycin and aztreonam for sepsis with pneumonia.  Assessment/Plan:  Continue vancomycin 750mg  IV q12h  Check vancomycin trough tonight prior to 5th maintenance dose  Continue aztreonam 2g IV q8h  Recommend narrowing to Levaquin 750mg  PO q24h to cover both pneumonia and UTI  Temp (24hrs), Avg:98.5 F (36.9 C), Min:97.7 F (36.5 C), Max:99 F (37.2 C)   Recent Labs Lab 03/18/15 1610 03/19/15 0415 03/20/15 0453  WBC 23.9* 18.0* 13.0*    Recent Labs Lab 03/18/15 1610 03/19/15 0415 03/20/15 0453 03/21/15 0536  CREATININE 1.28* 1.07* 0.83 0.64   Estimated Creatinine Clearance: 73 mL/min (by C-G formula based on Cr of 0.64).    Antimicrobial allergies: PCN, sulfa  Antimicrobials this admission: 12/8<< vanc >> 12/8<< aztreonam >>  Levels/dose changes this admission: 12/10: vanc trough at 23:30 = ___ mcg/ml on 750mg  q12h  Microbiology Results: 12/7 BCx: 1/2 CNS 12/7 C.diff: Ag (-), toxin (-) 12/8 urine: 80k Enterobacter cloacae (R) ancef, (I) nitro, (S) ctx, cipro, gent, imip, zosyn, bactrim 12/8 urine strep/legionella: both neg 12/8 influenza panel: neg  Thank you for allowing pharmacy to be a part of this patient's care.  Peggyann Juba, PharmD, BCPS Pager: [phone]  03/21/2015 1:00 PM

## 2015-03-22 DIAGNOSIS — N390 Urinary tract infection, site not specified: Secondary | ICD-10-CM | POA: Diagnosis present

## 2015-03-22 LAB — BASIC METABOLIC PANEL
Anion gap: 6 (ref 5–15)
BUN: 6 mg/dL (ref 6–20)
CALCIUM: 8.9 mg/dL (ref 8.9–10.3)
CHLORIDE: 104 mmol/L (ref 101–111)
CO2: 29 mmol/L (ref 22–32)
CREATININE: 0.62 mg/dL (ref 0.44–1.00)
Glucose, Bld: 110 mg/dL — ABNORMAL HIGH (ref 65–99)
Potassium: 3.9 mmol/L (ref 3.5–5.1)
SODIUM: 139 mmol/L (ref 135–145)

## 2015-03-22 LAB — CULTURE, BLOOD (ROUTINE X 2)

## 2015-03-22 LAB — CBC
HCT: 30.5 % — ABNORMAL LOW (ref 36.0–46.0)
Hemoglobin: 10.3 g/dL — ABNORMAL LOW (ref 12.0–15.0)
MCH: 30.9 pg (ref 26.0–34.0)
MCHC: 33.8 g/dL (ref 30.0–36.0)
MCV: 91.6 fL (ref 78.0–100.0)
PLATELETS: 270 10*3/uL (ref 150–400)
RBC: 3.33 MIL/uL — AB (ref 3.87–5.11)
RDW: 12.8 % (ref 11.5–15.5)
WBC: 9.7 10*3/uL (ref 4.0–10.5)

## 2015-03-22 LAB — PROCALCITONIN: PROCALCITONIN: 0.67 ng/mL

## 2015-03-22 LAB — VANCOMYCIN, TROUGH: Vancomycin Tr: 9 ug/mL — ABNORMAL LOW (ref 10.0–20.0)

## 2015-03-22 MED ORDER — SACCHAROMYCES BOULARDII 250 MG PO CAPS
250.0000 mg | ORAL_CAPSULE | Freq: Two times a day (BID) | ORAL | Status: DC
  Administered 2015-03-22 – 2015-03-24 (×5): 250 mg via ORAL
  Filled 2015-03-22 (×6): qty 1

## 2015-03-22 MED ORDER — FUROSEMIDE 10 MG/ML IJ SOLN
40.0000 mg | Freq: Once | INTRAMUSCULAR | Status: AC
  Administered 2015-03-22: 40 mg via INTRAVENOUS
  Filled 2015-03-22: qty 4

## 2015-03-22 MED ORDER — VANCOMYCIN HCL IN DEXTROSE 750-5 MG/150ML-% IV SOLN
750.0000 mg | Freq: Two times a day (BID) | INTRAVENOUS | Status: DC
  Filled 2015-03-22: qty 150

## 2015-03-22 MED ORDER — VANCOMYCIN HCL IN DEXTROSE 1-5 GM/200ML-% IV SOLN
1000.0000 mg | Freq: Two times a day (BID) | INTRAVENOUS | Status: DC
  Administered 2015-03-22 (×3): 1000 mg via INTRAVENOUS
  Filled 2015-03-22 (×4): qty 200

## 2015-03-22 MED ORDER — POTASSIUM CHLORIDE CRYS ER 20 MEQ PO TBCR
40.0000 meq | EXTENDED_RELEASE_TABLET | Freq: Once | ORAL | Status: AC
  Administered 2015-03-22: 40 meq via ORAL
  Filled 2015-03-22: qty 2

## 2015-03-22 MED ORDER — LOPERAMIDE HCL 2 MG PO CAPS
2.0000 mg | ORAL_CAPSULE | ORAL | Status: DC | PRN
  Administered 2015-03-22 – 2015-03-23 (×2): 2 mg via ORAL
  Filled 2015-03-22 (×2): qty 1

## 2015-03-22 NOTE — Progress Notes (Signed)
ANTIBIOTIC CONSULT NOTE - FOLLOW UP  Pharmacy Consult for Vancomycin Indication: sepsis/PNA  Allergies  Allergen Reactions  . Ivp Dye [Iodinated Diagnostic Agents] Anaphylaxis, Hives, Swelling and Other (See Comments)    Throat swell  . Penicillins Anaphylaxis    Has patient had a PCN reaction causing immediate rash, facial/tongue/throat swelling, SOB or lightheadedness with hypotension:  Has patient had a PCN reaction causing severe rash involving mucus membranes or skin necrosis:  Has patient had a PCN reaction that required hospitalization  Has patient had a PCN reaction occurring within the last 10 years:  If all of the above answers are "NO", then may proceed with Cephalosporin use.  . Sulfonamide Derivatives Other (See Comments)    Migraines  . Onion Other (See Comments)    Raw onions cause migraines    Patient Measurements: Height: 5' 3.5" (161.3 cm) Weight: 207 lb 0.2 oz (93.9 kg) IBW/kg (Calculated) : 53.55 Adjusted Body Weight:   Vital Signs: Temp: 98.9 F (37.2 C) (12/10 2011) Temp Source: Oral (12/10 2011) BP: 126/57 mmHg (12/10 2011) Pulse Rate: 79 (12/10 2011) Intake/Output from previous day: 12/10 0701 - 12/11 0700 In: 920 [P.O.:720; IV Piggyback:200] Out: -  Intake/Output from this shift:    Labs:  Recent Labs  03/19/15 0415 03/20/15 0453 03/21/15 0536  WBC 18.0* 13.0*  --   HGB 11.3* 10.0*  --   PLT 190 185  --   CREATININE 1.07* 0.83 0.64   Estimated Creatinine Clearance: 73 mL/min (by C-G formula based on Cr of 0.64).  Recent Labs  03/21/15 2335  [place] 9*     Microbiology: Recent Results (from the past 720 hour(s))  C difficile quick scan w PCR reflex     Status: None   Collection Time: 03/18/15  9:21 PM  Result Value Ref Range Status   C Diff antigen NEGATIVE NEGATIVE Final   C Diff toxin NEGATIVE NEGATIVE Final   C Diff interpretation Negative for toxigenic C. difficile  Final  Blood culture (routine x 2)     Status: None  (Preliminary result)   Collection Time: 03/18/15 10:04 PM  Result Value Ref Range Status   Specimen Description BLOOD LEFT ARM  Final   Special Requests BOTTLES DRAWN AEROBIC AND ANAEROBIC 3CC  Final   Culture  Setup Time   Final    GRAM POSITIVE COCCI IN CLUSTERS ANAEROBIC BOTTLE ONLY CRITICAL RESULT CALLED TO, READ BACK BY AND VERIFIED WITH: SONYA @0218  03/20/15 MKELLY    Culture   Final    STAPHYLOCOCCUS SPECIES (COAGULASE NEGATIVE) THE SIGNIFICANCE OF ISOLATING THIS ORGANISM FROM A SINGLE SET OF BLOOD CULTURES WHEN MULTIPLE SETS ARE DRAWN IS UNCERTAIN. PLEASE NOTIFY THE MICROBIOLOGY DEPARTMENT WITHIN ONE WEEK IF SPECIATION AND SENSITIVITIES ARE REQUIRED. Performed at [facility]    Report Status PENDING  Incomplete  Blood culture (routine x 2)     Status: None (Preliminary result)   Collection Time: 03/18/15 11:12 PM  Result Value Ref Range Status   Specimen Description BLOOD RIGHT ANTECUBITAL  Final   Special Requests BOTTLES DRAWN AEROBIC AND ANAEROBIC 5ML  Final   Culture   Final    NO GROWTH 2 DAYS Performed at [facility]    Report Status PENDING  Incomplete  Urine culture     Status: None   Collection Time: 03/19/15  2:30 PM  Result Value Ref Range Status   Specimen Description URINE, CLEAN CATCH  Final   Special Requests NONE  Final  Culture   Final    80,000 COLONIES/ml ENTEROBACTER CLOACAE Performed at [facility]    Report Status 03/21/2015 FINAL  Final   Organism ID, Bacteria ENTEROBACTER CLOACAE  Final      Susceptibility   Enterobacter cloacae - MIC*    CEFAZOLIN >=64 RESISTANT Resistant     CEFTRIAXONE <=1 SENSITIVE Sensitive     CIPROFLOXACIN <=0.25 SENSITIVE Sensitive     GENTAMICIN <=1 SENSITIVE Sensitive     IMIPENEM <=0.25 SENSITIVE Sensitive     NITROFURANTOIN 64 INTERMEDIATE Intermediate     TRIMETH/SULFA <=20 SENSITIVE Sensitive     PIP/TAZO 8 SENSITIVE Sensitive     * 80,000 COLONIES/ml ENTEROBACTER CLOACAE     Anti-infectives    Start     Dose/Rate Route Frequency Ordered Stop   03/22/15 0045  vancomycin (VANCOCIN) IVPB 750 mg/150 ml premix  Status:  Discontinued     750 mg 150 mL/hr over 60 Minutes Intravenous 2 times daily 03/22/15 0037 03/22/15 0037   03/22/15 0045  vancomycin (VANCOCIN) IVPB 1000 mg/200 mL premix     1,000 mg 200 mL/hr over 60 Minutes Intravenous 2 times daily 03/22/15 0038     03/20/15 0000  vancomycin (VANCOCIN) IVPB 750 mg/150 ml premix  Status:  Discontinued     750 mg 150 mL/hr over 60 Minutes Intravenous Every 12 hours 03/19/15 1044 03/22/15 0037   03/19/15 1100  aztreonam (AZACTAM) 2 g in dextrose 5 % 50 mL IVPB     2 g 100 mL/hr over 30 Minutes Intravenous Every 8 hours 03/19/15 1043     03/19/15 1045  vancomycin (VANCOCIN) 1,500 mg in sodium chloride 0.9 % 500 mL IVPB     1,500 mg 250 mL/hr over 120 Minutes Intravenous  Once 03/19/15 1042 03/19/15 1615   03/19/15 1000  doxycycline (VIBRAMYCIN) 100 mg in dextrose 5 % 250 mL IVPB  Status:  Discontinued     100 mg 125 mL/hr over 120 Minutes Intravenous Every 12 hours 03/19/15 0552 03/19/15 1029   03/19/15 0600  metroNIDAZOLE (FLAGYL) IVPB 500 mg  Status:  Discontinued     500 mg 100 mL/hr over 60 Minutes Intravenous Every 8 hours 03/18/15 2346 03/19/15 1029   03/18/15 2200  doxycycline (VIBRAMYCIN) 100 mg in dextrose 5 % 250 mL IVPB  Status:  Discontinued     100 mg 125 mL/hr over 120 Minutes Intravenous Every 12 hours 03/18/15 2141 03/18/15 2346   03/18/15 2145  metroNIDAZOLE (FLAGYL) IVPB 500 mg  Status:  Discontinued     500 mg 100 mL/hr over 60 Minutes Intravenous Every 8 hours 03/18/15 2141 03/18/15 2352      Assessment: Patient with low vancomycin level.  Prior doses charted.  Goal of Therapy:  Vancomycin trough level 15-20 mcg/ml  Plan:  Measure antibiotic drug levels at steady state Follow up culture results  Change vancomycin to 1gm iv q12hr  Tyler Deis, Shea Stakes Crowford 03/22/2015,3:13  AM

## 2015-03-22 NOTE — Progress Notes (Signed)
[facility] PROGRESS NOTE   Paula Booker [id number]:[id number] DOB: November 24, 1945 DOA: 03/18/2015 PCP: Antony Blackbird, MD  HPI/Subjective: Continues to have diarrhea, feeling weaker than yesterday. Less cough and sputum production.  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 Started on antibiotics for pneumonia. IV fluid for hydration, discontinued because of history of NICM. Sepsis physiology resolved.  CAP Lobar pneumonia involving the left lower lobe seen on the CT scan. Patient presented with left-sided pleuritic chest pain, fever, SOB, cough and sputum production. Started on antibiotics, continued. Supportive management with bronchodilators, mucolytics, antitussives and oxygen as needed. Negative flu PCR  UTI Enterobacter UTI, patient is on aztreonam.  Acute kidney injury Baseline creatinine 0.9, presented with creatinine of 1.28 with increase BUN. This is likely secondary to dehydration from sepsis/fever, resolved after hydration with IV fluids. D/C IVF.  Hypokalemia Repleted with oral supplements  Nonischemic cardiomyopathy Patient has cardiomyopathy with LVEF of 20-25%, no symptoms of decompensation. Patient received boluses of 3 L of normal saline yesterday, IV fluids for only 24 hours. IV fluids discontinued. Keep negative fluid balance, give 1 dose of Lasix.  Nausea and vomiting Symptomatic management, CT scan of abdomen pelvis no acute abnormalities, only left lower lobe pneumonia.  Coagulase negative staph species bacteremia 1/2 this is likely contamination, anyway patient is on vancomycin.  Diarrhea Patient has  significant loose stools, stool studies negative for C. difficile and other GI pathogens. I'll start on probiotics and antidiarrheal as needed.  Code Status: DNR Family Communication: Plan discussed with the patient. Disposition Plan: PT/OT, likely to be discharged on Monday. Diet: Diet regular Room service appropriate?: Yes; Fluid consistency:: Thin  Consultants:  None  Procedures:  None  Antibiotics:  Aztreonam and vancomycin to   Objective: Filed Vitals:   03/22/15 0454 03/22/15 0745  BP: 146/59 136/75  Pulse: 86 88  Temp: 98.6 F (37 C)   Resp: 20     Intake/Output Summary (Last 24 hours) at 03/22/15 0947 Last data filed at 03/22/15 0748  Gross per 24 hour  Intake   1040 ml  Output      0 ml  Net   1040 ml   Filed Weights   03/18/15 2303  Weight: 93.9 kg (207 lb 0.2 oz)    Exam: General: Alert and awake, oriented x3, not in any acute distress. HEENT: anicteric sclera, pupils reactive to light and accommodation, EOMI CVS: S1-S2 clear, no murmur rubs or gallops Chest: clear to auscultation bilaterally, no wheezing, rales or rhonchi Abdomen: soft nontender, nondistended, normal bowel sounds, no organomegaly Extremities: no cyanosis, clubbing or edema noted bilaterally Neuro: Cranial nerves II-XII intact, no focal neurological deficits  Data Reviewed: Basic Metabolic Panel:  Recent Labs Lab 03/18/15 1610 03/19/15 0415 03/20/15 0453 03/21/15 0536 03/22/15 0515  NA 130* 131* 136 139 139  K 3.3* 3.0* 4.6 4.4 3.9  CL 99* 103 109 107 104  CO2 20* 20* '23 28 29  ' GLUCOSE 156* 149* 106* 107* 110*  BUN 30* 27* '16 8 6  ' CREATININE 1.28* 1.07* 0.83 0.64 0.62  CALCIUM 8.9 8.3* 7.9* 8.5* 8.9  MG  --  1.7 2.5*  --   --  Liver Function Tests:  Recent Labs Lab 03/18/15 1610 03/19/15 0415  AST 18 15  ALT 14 12*  ALKPHOS 71 66  BILITOT 1.3* 1.1  PROT 7.5 6.9  ALBUMIN 3.7 3.3*    Recent Labs Lab 03/18/15 1610  LIPASE 22   No results for  input(s): AMMONIA in the last 168 hours. CBC:  Recent Labs Lab 03/18/15 1610 03/19/15 0415 03/20/15 0453 03/22/15 0515  WBC 23.9* 18.0* 13.0* 9.7  NEUTROABS  --  15.3*  --   --   HGB 12.6 11.3* 10.0* 10.3*  HCT 37.9 33.4* 30.1* 30.5*  MCV 91.5 90.5 91.2 91.6  PLT 197 190 185 270   Cardiac Enzymes:  Recent Labs Lab 03/19/15 0415 03/19/15 1030 03/19/15 1649  TROPONINI <0.03 <0.03 <0.03   BNP (last 3 results)  Recent Labs  08/27/14 0221  BNP 48.5    ProBNP (last 3 results)  Recent Labs  10/08/14 1613  PROBNP 52.0    CBG: No results for input(s): GLUCAP in the last 168 hours.  Micro Recent Results (from the past 240 hour(s))  C difficile quick scan w PCR reflex     Status: None   Collection Time: 03/18/15  9:21 PM  Result Value Ref Range Status   C Diff antigen NEGATIVE NEGATIVE Final   C Diff toxin NEGATIVE NEGATIVE Final   C Diff interpretation Negative for toxigenic C. difficile  Final  Blood culture (routine x 2)     Status: None   Collection Time: 03/18/15 10:04 PM  Result Value Ref Range Status   Specimen Description BLOOD LEFT ARM  Final   Special Requests BOTTLES DRAWN AEROBIC AND ANAEROBIC 3CC  Final   Culture  Setup Time   Final    GRAM POSITIVE COCCI IN CLUSTERS ANAEROBIC BOTTLE ONLY CRITICAL RESULT CALLED TO, READ BACK BY AND VERIFIED WITH: SONYA '@0218'  03/20/15 MKELLY    Culture   Final    STAPHYLOCOCCUS SPECIES (COAGULASE NEGATIVE) THE SIGNIFICANCE OF ISOLATING THIS ORGANISM FROM A SINGLE SET OF BLOOD CULTURES WHEN MULTIPLE SETS ARE DRAWN IS UNCERTAIN. PLEASE NOTIFY THE MICROBIOLOGY DEPARTMENT WITHIN ONE WEEK IF SPECIATION AND SENSITIVITIES ARE REQUIRED. Performed at [facility]    Report Status 03/22/2015 FINAL  Final  Blood culture (routine x 2)     Status: None (Preliminary result)   Collection Time: 03/18/15 11:12 PM  Result Value Ref Range Status   Specimen Description BLOOD RIGHT ANTECUBITAL  Final   Special Requests  BOTTLES DRAWN AEROBIC AND ANAEROBIC 5ML  Final   Culture   Final    NO GROWTH 2 DAYS Performed at [facility]    Report Status PENDING  Incomplete  Urine culture     Status: None   Collection Time: 03/19/15  2:30 PM  Result Value Ref Range Status   Specimen Description URINE, CLEAN CATCH  Final   Special Requests NONE  Final   Culture   Final    80,000 COLONIES/ml ENTEROBACTER CLOACAE Performed at [facility]    Report Status 03/21/2015 FINAL  Final   Organism ID, Bacteria ENTEROBACTER CLOACAE  Final      Susceptibility   Enterobacter cloacae - MIC*    CEFAZOLIN >=64 RESISTANT Resistant     CEFTRIAXONE <=1 SENSITIVE Sensitive     CIPROFLOXACIN <=0.25 SENSITIVE Sensitive     GENTAMICIN <=1 SENSITIVE Sensitive     IMIPENEM <=0.25 SENSITIVE Sensitive     NITROFURANTOIN 64 INTERMEDIATE Intermediate     TRIMETH/SULFA <=20  SENSITIVE Sensitive     PIP/TAZO 8 SENSITIVE Sensitive     * 80,000 COLONIES/ml ENTEROBACTER CLOACAE     Studies: No results found.  Scheduled Meds: . antiseptic oral rinse  7 mL Mouth Rinse BID  . aztreonam  2 g Intravenous Q8H  . carvedilol  12.5 mg Oral BID WC  . cholecalciferol  1,000 Units Oral Daily  . citalopram  20 mg Oral Daily  . enoxaparin (LOVENOX) injection  40 mg Subcutaneous QHS  . guaiFENesin  1,200 mg Oral BID  . loratadine  10 mg Oral Daily  . pantoprazole  40 mg Oral Daily  . vancomycin  1,000 mg Intravenous BID   Continuous Infusions:       Time spent: 35 minutes    [facility] A  [facility] Pager [phone] If 7PM-7AM, please contact night-coverage at [email or web address], password [facility] 03/22/2015, 9:47 AM  LOS: 4 days

## 2015-03-23 DIAGNOSIS — N3000 Acute cystitis without hematuria: Secondary | ICD-10-CM

## 2015-03-23 LAB — BASIC METABOLIC PANEL
Anion gap: 8 (ref 5–15)
BUN: 10 mg/dL (ref 6–20)
CHLORIDE: 98 mmol/L — AB (ref 101–111)
CO2: 27 mmol/L (ref 22–32)
CREATININE: 0.68 mg/dL (ref 0.44–1.00)
Calcium: 8.9 mg/dL (ref 8.9–10.3)
GFR calc Af Amer: >60 mL/min (ref >60)
GFR calc non Af Amer: >60 mL/min (ref >60)
GLUCOSE: 158 mg/dL — AB (ref 65–99)
POTASSIUM: 3.8 mmol/L (ref 3.5–5.1)
Sodium: 133 mmol/L — ABNORMAL LOW (ref 135–145)

## 2015-03-23 MED ORDER — LEVOFLOXACIN 750 MG PO TABS
750.0000 mg | ORAL_TABLET | Freq: Every day | ORAL | Status: DC
  Administered 2015-03-23 – 2015-03-24 (×2): 750 mg via ORAL
  Filled 2015-03-23 (×2): qty 1

## 2015-03-23 MED ORDER — PSYLLIUM 95 % PO PACK
1.0000 | PACK | Freq: Two times a day (BID) | ORAL | Status: DC
  Administered 2015-03-23 – 2015-03-24 (×3): 1 via ORAL
  Filled 2015-03-23 (×3): qty 1

## 2015-03-23 MED ORDER — POTASSIUM CHLORIDE CRYS ER 20 MEQ PO TBCR
40.0000 meq | EXTENDED_RELEASE_TABLET | Freq: Once | ORAL | Status: AC
  Administered 2015-03-23: 40 meq via ORAL
  Filled 2015-03-23: qty 2

## 2015-03-23 MED ORDER — FUROSEMIDE 10 MG/ML IJ SOLN
40.0000 mg | Freq: Once | INTRAMUSCULAR | Status: AC
  Administered 2015-03-23: 40 mg via INTRAVENOUS
  Filled 2015-03-23: qty 4

## 2015-03-23 NOTE — Progress Notes (Signed)
Walked 250 feet with patient in the hall without o2.  O2 levels stayed at 96-97%.  Patient tolerated fair.  She stated she felt dizzy and nauseous after walking.

## 2015-03-23 NOTE — Care Management Important Message (Signed)
Important Message  Patient Details  Name: Paula Booker MRN: [id number] Date of Birth: 05-Dec-1945   Medicare Important Message Given:  Yes    Camillo Flaming 03/23/2015, 12:59 [place] Message  Patient Details  Name: Paula Booker MRN: [id number] Date of Birth: Aug 24, 1945   Medicare Important Message Given:  Yes    Camillo Flaming 03/23/2015, 12:59 PM

## 2015-03-23 NOTE — Progress Notes (Signed)
OT Cancellation Note  Patient Details Name: Paula Booker MRN: [id number] DOB: Dec 28, 1945   Cancelled Treatment:    Reason Eval/Treat Not Completed: OT screened, no needs identified, will sign off  Pt does not feel she needs OT.    Kari Baars, [place] [phone]  03/23/2015, 12:45 PM

## 2015-03-23 NOTE — Evaluation (Signed)
Physical Therapy Evaluation Patient Details Name: Paula Booker MRN: [id number] DOB: May 27, 1945 Today's Date: 03/23/2015   History of Present Illness  69 yo female admitted with sepsis. Hx of CHF, pacemaker, ICD, HTN, obesity, T2 KP 05/2014, back pain.   Clinical Impression  On eval, pt required Min guard assist for mobility-walked ~250 feet. Seated rest break taken/needed due to fatigue, lightheadedness. Pt appears weaker than baseline so will recommend HHPT at discharge. Encouraged pt to continue ambulating with nursing as able.     Follow Up Recommendations Home health PT    Equipment Recommendations  None recommended by PT    Recommendations for Other Services       Precautions / Restrictions        Mobility  Bed Mobility Overal bed mobility: Modified Independent                Transfers Overall transfer level: Modified independent                  Ambulation/Gait Ambulation/Gait assistance: Min guard Ambulation Distance (Feet): 250 Feet Assistive device: None Gait Pattern/deviations: Step-through pattern     General Gait Details: 1 seated rest break needed due to lightheadedness. very close guard.   Stairs            Wheelchair Mobility    Modified Rankin (Stroke Patients Only)       Balance Overall balance assessment: Needs assistance         Standing balance support: During functional activity Standing balance-Leahy Scale: Good                               Pertinent Vitals/Pain Pain Assessment: No/denies pain    Home Living Family/patient expects to be discharged to:: Private residence Living Arrangements: Alone   Type of Home: House Home Access: Ramped entrance;Stairs to enter   Entrance Stairs-Number of Steps: 5 Home Layout: Two level Home Equipment: Environmental consultant - 2 wheels;Crutches      Prior Function Level of Independence: Independent               Hand Dominance        Extremity/Trunk  Assessment               Lower Extremity Assessment: Generalized weakness         Communication   Communication: No difficulties  Cognition Arousal/Alertness: Awake/alert Behavior During Therapy: WFL for tasks assessed/performed Overall Cognitive Status: Within Functional Limits for tasks assessed                      General Comments      Exercises        Assessment/Plan    PT Assessment Patient needs continued PT services  PT Diagnosis Difficulty walking;Generalized weakness   PT Problem List Decreased mobility;Decreased strength;Decreased activity tolerance  PT Treatment Interventions Gait training;Functional mobility training;Therapeutic activities;Patient/family education;Balance training;Therapeutic exercise   PT Goals (Current goals can be found in the Care Plan section)      Frequency     Barriers to discharge        Co-evaluation               End of Session   Activity Tolerance: Patient limited by fatigue Patient left: in bed;with call bell/phone within reach           Time: [id number] PT Time Calculation (min) (ACUTE ONLY): 21 min   Charges:  PT Evaluation $Initial PT Evaluation Tier I: 1 Procedure     PT G Codes:        Weston Anna, MPT Pager: [phone]

## 2015-03-23 NOTE — Progress Notes (Signed)
[facility] PROGRESS NOTE   Paula Booker BCW:[id number] DOB: 1945-12-19 DOA: 03/18/2015 PCP: Antony Blackbird, MD  HPI/Subjective: Feels much better today, less diarrhea. Get PT/OT to evaluate her, likely can be discharged in a.m. if she feels stronger.  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 Started on antibiotics for pneumonia. IV fluid for hydration, discontinued because of history of NICM. Sepsis physiology resolved.  CAP Lobar pneumonia involving the left lower lobe seen on the CT scan. Patient presented with left-sided pleuritic chest pain, fever, SOB, cough and sputum production. Started on antibiotics, continued. Supportive management with bronchodilators, mucolytics, antitussives and oxygen as needed. Negative flu PCR Treated with vancomycin and aztreonam, switched today to oral levofloxacin.  UTI Enterobacter UTI, patient is on aztreonam. Switch to oral levofloxacin.  Acute kidney injury Baseline creatinine 0.9, presented with creatinine of 1.28 with increase BUN. This is likely secondary to dehydration from sepsis/fever, resolved after hydration with IV fluids.  IV fluids discontinued, given Lasix as  Hypokalemia Repleted with oral supplements  Nonischemic cardiomyopathy Patient has cardiomyopathy with LVEF of 20-25%, no symptoms of decompensation. Patient received boluses of 3 L of normal saline yesterday, IV fluids for only 24 hours. IV fluids discontinued. Keep negative fluid balance, repeat Lasix.  Nausea and vomiting Symptomatic management, CT scan of abdomen pelvis no acute  abnormalities, only left lower lobe pneumonia.  Coagulase negative staph species bacteremia 1/2 this is likely contamination, anyway patient is on vancomycin.  Diarrhea Patient has significant loose stools, stool studies negative for C. difficile and other GI pathogens. I'll start on probiotics and antidiarrheal as needed. I'll add Metamucil.  Code Status: DNR Family Communication: Plan discussed with the patient. Disposition Plan: PT/OT, likely to be discharged on Monday. Diet: Diet regular Room service appropriate?: Yes; Fluid consistency:: Thin  Consultants:  None  Procedures:  None  Antibiotics:  Aztreonam and vancomycin to   Objective: Filed Vitals:   03/22/15 2041 03/23/15 0503  BP: 138/64 137/62  Pulse: 83 79  Temp: 97.6 F (36.4 C) 97.9 F (36.6 C)  Resp: 20 20    Intake/Output Summary (Last 24 hours) at 03/23/15 1149 Last data filed at 03/23/15 0835  Gross per 24 hour  Intake    760 ml  Output    700 ml  Net     60 ml   Filed Weights   03/18/15 2303  Weight: 93.9 kg (207 lb 0.2 oz)    Exam: General: Alert and awake, oriented x3, not in any acute distress. HEENT: anicteric sclera, pupils reactive to light and accommodation, EOMI CVS: S1-S2 clear, no murmur rubs or gallops Chest: clear to auscultation bilaterally, no wheezing, rales or rhonchi Abdomen: soft nontender, nondistended, normal bowel sounds, no organomegaly Extremities: no cyanosis, clubbing or edema noted bilaterally Neuro: Cranial nerves II-XII intact, no focal neurological deficits  Data Reviewed: Basic Metabolic Panel:  Recent Labs Lab 03/19/15 0415 03/20/15 0453 03/21/15 0536 03/22/15 0515 03/23/15 0531  NA 131* 136 139 139 133*  K 3.0* 4.6 4.4 3.9 3.8  CL 103 109 107 104 98*  CO2 20* '23 28 29 ' 27  GLUCOSE 149* 106* 107* 110* 158*  BUN 27* '16 8 6 10  ' CREATININE 1.07* 0.83 0.64 0.62 0.68  CALCIUM 8.3* 7.9* 8.5* 8.9 8.9  MG 1.7 2.5*  --   --   --    Liver Function  Tests:  Recent Labs Lab 03/18/15 1610 03/19/15 0415  AST 18 15  ALT 14 12*  ALKPHOS 71 66  BILITOT 1.3* 1.1  PROT 7.5 6.9  ALBUMIN 3.7 3.3*    Recent Labs Lab 03/18/15 1610  LIPASE 22   No results for input(s): AMMONIA in the last 168 hours. CBC:  Recent Labs Lab 03/18/15 1610 03/19/15 0415 03/20/15 0453 03/22/15 0515  WBC 23.9* 18.0* 13.0* 9.7  NEUTROABS  --  15.3*  --   --   HGB 12.6 11.3* 10.0* 10.3*  HCT 37.9 33.4* 30.1* 30.5*  MCV 91.5 90.5 91.2 91.6  PLT 197 190 185 270   Cardiac Enzymes:  Recent Labs Lab 03/19/15 0415 03/19/15 1030 03/19/15 1649  TROPONINI <0.03 <0.03 <0.03   BNP (last 3 results)  Recent Labs  08/27/14 0221  BNP 48.5    ProBNP (last 3 results)  Recent Labs  10/08/14 1613  PROBNP 52.0    CBG: No results for input(s): GLUCAP in the last 168 hours.  Micro Recent Results (from the past 240 hour(s))  C difficile quick scan w PCR reflex     Status: None   Collection Time: 03/18/15  9:21 PM  Result Value Ref Range Status   C Diff antigen NEGATIVE NEGATIVE Final   C Diff toxin NEGATIVE NEGATIVE Final   C Diff interpretation Negative for toxigenic C. difficile  Final  Blood culture (routine x 2)     Status: None   Collection Time: 03/18/15 10:04 PM  Result Value Ref Range Status   Specimen Description BLOOD LEFT ARM  Final   Special Requests BOTTLES DRAWN AEROBIC AND ANAEROBIC 3CC  Final   Culture  Setup Time   Final    GRAM POSITIVE COCCI IN CLUSTERS ANAEROBIC BOTTLE ONLY CRITICAL RESULT CALLED TO, READ BACK BY AND VERIFIED WITH: SONYA '@[id number]'  03/20/15 MKELLY    Culture   Final    STAPHYLOCOCCUS SPECIES (COAGULASE NEGATIVE) THE SIGNIFICANCE OF ISOLATING THIS ORGANISM FROM A SINGLE SET OF BLOOD CULTURES WHEN MULTIPLE SETS ARE DRAWN IS UNCERTAIN. PLEASE NOTIFY THE MICROBIOLOGY DEPARTMENT WITHIN ONE WEEK IF SPECIATION AND SENSITIVITIES ARE REQUIRED. Performed at [facility]    Report Status 03/22/2015 FINAL   Final  Blood culture (routine x 2)     Status: None (Preliminary result)   Collection Time: 03/18/15 11:12 PM  Result Value Ref Range Status   Specimen Description BLOOD RIGHT ANTECUBITAL  Final   Special Requests BOTTLES DRAWN AEROBIC AND ANAEROBIC 5ML  Final   Culture   Final    NO GROWTH 3 DAYS Performed at [facility]    Report Status PENDING  Incomplete  Urine culture     Status: None   Collection Time: 03/19/15  2:30 PM  Result Value Ref Range Status   Specimen Description URINE, CLEAN CATCH  Final   Special Requests NONE  Final   Culture   Final    80,000 COLONIES/ml ENTEROBACTER CLOACAE Performed at [facility]    Report Status 03/21/2015 FINAL  Final   Organism ID, Bacteria ENTEROBACTER CLOACAE  Final      Susceptibility   Enterobacter cloacae - MIC*    CEFAZOLIN >=64 RESISTANT Resistant  CEFTRIAXONE <=1 SENSITIVE Sensitive     CIPROFLOXACIN <=0.25 SENSITIVE Sensitive     GENTAMICIN <=1 SENSITIVE Sensitive     IMIPENEM <=0.25 SENSITIVE Sensitive     NITROFURANTOIN 64 INTERMEDIATE Intermediate     TRIMETH/SULFA <=20 SENSITIVE Sensitive     PIP/TAZO 8 SENSITIVE Sensitive     * 80,000 COLONIES/ml ENTEROBACTER CLOACAE     Studies: No results found.  Scheduled Meds: . antiseptic oral rinse  7 mL Mouth Rinse BID  . carvedilol  12.5 mg Oral BID WC  . cholecalciferol  1,000 Units Oral Daily  . citalopram  20 mg Oral Daily  . enoxaparin (LOVENOX) injection  40 mg Subcutaneous QHS  . guaiFENesin  1,200 mg Oral BID  . levofloxacin  750 mg Oral Daily  . loratadine  10 mg Oral Daily  . pantoprazole  40 mg Oral Daily  . saccharomyces boulardii  250 mg Oral BID   Continuous Infusions:       Time spent: 35 minutes    [facility] A  [facility] Pager [phone] If 7PM-7AM, please contact night-coverage at [email or web address], password [facility] 03/23/2015, 11:49 AM  LOS: 5 days

## 2015-03-24 ENCOUNTER — Ambulatory Visit: Payer: Medicare Other | Admitting: Physical Therapy

## 2015-03-24 LAB — BASIC METABOLIC PANEL
ANION GAP: 9 (ref 5–15)
BUN: 11 mg/dL (ref 6–20)
CALCIUM: 9.5 mg/dL (ref 8.9–10.3)
CHLORIDE: 99 mmol/L — AB (ref 101–111)
CO2: 26 mmol/L (ref 22–32)
Creatinine, Ser: 0.71 mg/dL (ref 0.44–1.00)
GFR calc non Af Amer: >60 mL/min (ref >60)
GLUCOSE: 108 mg/dL — AB (ref 65–99)
Potassium: 4.2 mmol/L (ref 3.5–5.1)
Sodium: 134 mmol/L — ABNORMAL LOW (ref 135–145)

## 2015-03-24 LAB — CULTURE, BLOOD (ROUTINE X 2): CULTURE: NO GROWTH

## 2015-03-24 MED ORDER — LOPERAMIDE HCL 2 MG PO CAPS: 2.0000 mg | ORAL_CAPSULE | ORAL | Status: DC | PRN

## 2015-03-24 MED ORDER — LEVOFLOXACIN 750 MG PO TABS: 750.0000 mg | ORAL_TABLET | Freq: Every day | ORAL | Status: DC

## 2015-03-24 MED ORDER — SACCHAROMYCES BOULARDII 250 MG PO CAPS: 250.0000 mg | ORAL_CAPSULE | Freq: Two times a day (BID) | ORAL | Status: DC

## 2015-03-24 NOTE — Discharge Summary (Signed)
Physician Discharge Summary  Taneyah Abdelaziz ZOX:[id number] DOB: 05/26/1945 DOA: 03/18/2015  PCP: Cain Saupe, MD  Admit date: 03/18/2015 Discharge date: 03/24/2015  Time spent: 40 minutes  Recommendations for Outpatient Follow-up:  1. Follow-up with primary physician. 2. Take Levaquin for 3 more days.   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 female with history of nonischemic cardiomyopathy last known LVEF was 20-25%, chronic LBBB, history of WPW syndrome, hypertension presents to the ER because of persistent nausea vomiting and diarrhea over the last 3 days. Patient has been recently to [place] to visit her family and one half grandsons had croup. Denies any contacts with people having gastroenteritis or have not had any uncooked food. Denies any blood in the vomitus or diarrhea. Patient does have some abdominal pain after each vomiting. CT abdomen and pelvis only shows possible pneumonia otherwise unremarkable. Blood cultures have been obtained and started on fluids and antibiotics. Stool for C. difficile has been negative. Patient otherwise denies any chest pain or shortness of breath. Has some productive cough since started vomiting.  Hospital Course:   Sepsis Patient met sepsis criteria with fever 103.3, respiratory rate of 28, heart rate of 110 and presence of pneumonia. Started on antibiotics for pneumonia. IV fluid for hydration, discontinued because of history of NICM. Sepsis physiology resolved.  CAP Lobar pneumonia  involving the left lower lobe seen on the CT scan. Patient presented with left-sided pleuritic chest pain, fever, SOB, cough and sputum production. Started on antibiotics, continued. Supportive management with bronchodilators, mucolytics, antitussives and oxygen as needed. Negative flu PCR Treated with vancomycin and aztreonam, switched to levofloxacin, discharged on 3 more days.  UTI Enterobacter UTI, patient is on aztreonam. Switched to oral levofloxacin.  Nausea, vomiting and Diarrhea This is the initial presenting symptoms, CT scan of abdomen and pelvis showed no acute abnormalities. Patient has significant loose stools, stool studies negative for C. difficile and other GI pathogens. Added Imodium and probiotics on discharge.  Acute kidney injury Baseline creatinine 0.9, presented with creatinine of 1.28 with increase BUN. This is likely secondary to dehydration from sepsis/fever, resolved after hydration with IV fluids.  IV fluids discontinued, Lasix was started on discharge.  Hypokalemia Repleted with oral supplements  Nonischemic cardiomyopathy Patient has cardiomyopathy with LVEF of 20-25%, no symptoms of decompensation. Patient received boluses of 3 L of normal saline yesterday, IV fluids for only 24 hours. IV fluids discontinued. Keep negative fluid balance, repeat Lasix.  Coagulase negative staph species bacteremia 1/2 this is likely contamination, anyway patient is on vancomycin.   Procedures:  None  Consultations:  None  Discharge Exam: Filed Vitals:   03/24/15 0558 03/24/15 0827  BP: 119/68 129/59  Pulse: 87 84  Temp: 98.6 F (37 C)   Resp: 18    General: Alert and awake, oriented x3, not in any acute distress. HEENT: anicteric sclera, pupils reactive to light and accommodation, EOMI CVS: S1-S2 clear, no murmur rubs or gallops Chest: clear to auscultation bilaterally, no wheezing, rales or  rhonchi Abdomen: soft nontender, nondistended, normal bowel  sounds, no organomegaly Extremities: no cyanosis, clubbing or edema noted bilaterally Neuro: Cranial nerves II-XII intact, no focal neurological deficits  Discharge Instructions   Discharge Instructions    Diet - low sodium heart healthy    Complete by:  As directed      Increase activity slowly    Complete by:  As directed           Current Discharge Medication List    START taking these medications   Details  levofloxacin (LEVAQUIN) 750 MG tablet Take 1 tablet (750 mg total) by mouth daily. Qty: 3 tablet, Refills: 0    loperamide (IMODIUM) 2 MG capsule Take 1 capsule (2 mg total) by mouth as needed for diarrhea or loose stools (Do not exceed 8 times per day). Qty: 30 capsule, Refills: 0    saccharomyces boulardii (FLORASTOR) 250 MG capsule Take 1 capsule (250 mg total) by mouth 2 (two) times daily. Qty: 60 capsule, Refills: 0      CONTINUE these medications which have NOT CHANGED   Details  albuterol (PROVENTIL HFA;VENTOLIN HFA) 108 (90 BASE) MCG/ACT inhaler Inhale 2 puffs into the lungs every 6 (six) hours as needed. For wheezing    alendronate (FOSAMAX) 70 MG tablet Take 70 mg by mouth once a week. Take with a full glass of water on an empty stomach.    carvedilol (COREG) 12.5 MG tablet Take 1 tablet (12.5 mg total) by mouth 2 (two) times daily with a meal. Qty: 180 tablet, Refills: 3   Associated Diagnoses: NICM (nonischemic cardiomyopathy) (HCC)    cetirizine (ZYRTEC) 10 MG tablet Take 10 mg by mouth daily.    Cholecalciferol (VITAMIN D3) 1000 UNITS CAPS Take 1,000 Units by mouth daily.     citalopram (CELEXA) 20 MG tablet Take 20 mg by mouth daily.    fluticasone (FLONASE) 50 MCG/ACT nasal spray Place 1 spray into both nostrils daily as needed for allergies.     furosemide (LASIX) 40 MG tablet TAKE ONE TABLET BY MOUTH ONCE DAILY Qty: 30 tablet, Refills: 10    gelatin 650 MG capsule Take 1,300 mg by mouth daily.    losartan (COZAAR) 25 MG tablet Take 1 tablet  (25 mg total) by mouth 2 (two) times daily. Qty: 180 tablet, Refills: 3    Multiple Vitamins-Minerals (MULTIVITAMIN PO) Take 1 tablet by mouth daily. ALIVE    omeprazole (PRILOSEC) 20 MG capsule Take 20 mg by mouth daily.     spironolactone (ALDACTONE) 25 MG tablet Take 1 tablet (25 mg total) by mouth daily. Qty: 90 tablet, Refills: 3   Associated Diagnoses: NICM (nonischemic cardiomyopathy) (HCC)      STOP taking these medications     naproxen sodium (ANAPROX) 220 MG tablet        Allergies  Allergen Reactions  . Ivp Dye [Iodinated Diagnostic Agents] Anaphylaxis, Hives, Swelling and Other (See Comments)    Throat swell  . Penicillins Anaphylaxis      . Sulfonamide Derivatives Other (See Comments)    Migraines  . Onion Other (See Comments)    Raw onions cause migraines      The results of significant diagnostics from this hospitalization (including imaging, microbiology, ancillary and laboratory) are listed below for reference.    Significant Diagnostic Studies: Ct Abdomen Pelvis Wo Contrast  03/18/2015  CLINICAL DATA:  Vomiting, diarrhea for 2 days EXAM: CT ABDOMEN AND PELVIS WITHOUT CONTRAST TECHNIQUE: Multidetector CT imaging of the abdomen and  pelvis was performed following the standard protocol without IV contrast. COMPARISON:  01/27/2014 FINDINGS: There are streaky artifacts from patient's large body habitus. Partially visualized cardiac pacemaker lead in right ventricle. Sagittal images of the spine shows prior vertebroplasty lower thoracic spine at T11 and T9 level. Small hiatal hernia is noted. There is partially visualized infiltrate/pneumonia in left lower lobe posterior laterally. Please see axial image 1. Unenhanced liver shows no biliary ductal dilatation. Status postcholecystectomy. Unenhanced pancreas, spleen and adrenal glands are unremarkable. Unenhanced kidneys are symmetrical in size. No hydronephrosis or hydroureter. No nephrolithiasis. No calcified ureteral  calculi. Oral contrast material was given to the patient. There is no small bowel obstruction. No ascites or free air. No adenopathy. The terminal ileum is unremarkable. Normal appendix is partially visualized in axial image 56. The uterus is anteflexed. No adnexal mass. No distal colonic obstruction. No colitis or diverticulitis. Contrast material noted within rectum. The urinary bladder is unremarkable. IMPRESSION: 1. There is infiltrate/pneumonia in left lower lobe posterior and lateral partially visualized. Please see axial image 1. 2. Small hiatal hernia. 3. No nephrolithiasis.  No hydronephrosis or hydroureter. 4. No small bowel obstruction. 5. No pericecal inflammation. Normal appendix. Unremarkable terminal ileum. 6. No colonic obstruction.  Unremarkable uterus and adnexa. Electronically Signed   By: Natasha Mead M.D.   On: 03/18/2015 19:32   Dg Chest 2 View  03/18/2015  CLINICAL DATA:  Cardiac arrhythmia.  Nausea and vomiting. EXAM: CHEST  2 VIEW COMPARISON:  Aug 27, 2014 FINDINGS: There is a pacemaker with lead tips attached to the right atrium and right ventricle. No pneumothorax. There is atelectatic change in the left lower lobe. Lungs elsewhere clear. Heart size and pulmonary vascularity are normal. No adenopathy. The patient has undergone kyphoplasty procedures at several levels in the thoracic spine. IMPRESSION: Pacemaker leads attached to right atrium right ventricle. No pneumothorax. Atelectasis left base. Lungs elsewhere clear. Electronically Signed   By: Bretta Bang III M.D.   On: 03/18/2015 20:26   Nm Bone Scan 3 Phase  02/25/2015  CLINICAL DATA:  Pain in middle to low back which radiates to low back. EXAM: NUCLEAR MEDICINE 3-PHASE BONE SCAN TECHNIQUE: Radionuclide angiographic images, immediate static blood pool images, and 3-hour delayed static images were obtained of the thoracic and lumbar spine after intravenous injection of radiopharmaceutical. RADIOPHARMACEUTICALS:  26.1 mCi  Tc-60m MDP COMPARISON:  None. FINDINGS: Vascular phase: No abnormal areas of increased uptake identified. Blood pool phase: No abnormal blood pool activity identified. Delayed phase: Three foci of increased uptake are identified within the thoracic spine corresponding to the treated compression fractures at T7, T9 and T11 vertebra. No abnormal uptake identified within the lumbar spine. IMPRESSION: 1. Three foci of increased uptake localize to treated fractures at the T7, T9 and T11 vertebra. Electronically Signed   By: Signa Kell M.D.   On: 02/25/2015 14:17    Microbiology: Recent Results (from the past 240 hour(s))  C difficile quick scan w PCR reflex     Status: None   Collection Time: 03/18/15  9:21 PM  Result Value Ref Range Status   C Diff antigen NEGATIVE NEGATIVE Final   C Diff toxin NEGATIVE NEGATIVE Final   C Diff interpretation Negative for toxigenic C. difficile  Final  Blood culture (routine x 2)     Status: None   Collection Time: 03/18/15 10:04 PM  Result Value Ref Range Status   Specimen Description BLOOD LEFT ARM  Final   Special Requests BOTTLES DRAWN  AEROBIC AND ANAEROBIC 3CC  Final   Culture  Setup Time   Final    GRAM POSITIVE COCCI IN CLUSTERS ANAEROBIC BOTTLE ONLY CRITICAL RESULT CALLED TO, READ BACK BY AND VERIFIED WITH: SONYA @0218  03/20/15 MKELLY    Culture   Final    STAPHYLOCOCCUS SPECIES (COAGULASE NEGATIVE) THE SIGNIFICANCE OF ISOLATING THIS ORGANISM FROM A SINGLE SET OF BLOOD CULTURES WHEN MULTIPLE SETS ARE DRAWN IS UNCERTAIN. PLEASE NOTIFY THE MICROBIOLOGY DEPARTMENT WITHIN ONE WEEK IF SPECIATION AND SENSITIVITIES ARE REQUIRED. Performed at [facility]    Report Status 03/22/2015 FINAL  Final  Blood culture (routine x 2)     Status: None (Preliminary result)   Collection Time: 03/18/15 11:12 PM  Result Value Ref Range Status   Specimen Description BLOOD RIGHT ANTECUBITAL  Final   Special Requests BOTTLES DRAWN AEROBIC AND ANAEROBIC  Final    Culture   Final    NO GROWTH 4 DAYS Performed at [facility]    Report Status PENDING  Incomplete  Urine culture     Status: None   Collection Time: 03/19/15  2:30 PM  Result Value Ref Range Status   Specimen Description URINE, CLEAN CATCH  Final   Special Requests NONE  Final   Culture   Final    80,000 COLONIES/ml ENTEROBACTER CLOACAE Performed at [facility]    Report Status 03/21/2015 FINAL  Final   Organism ID, Bacteria ENTEROBACTER CLOACAE  Final      Susceptibility   Enterobacter cloacae - MIC*    CEFAZOLIN >=64 RESISTANT Resistant     CEFTRIAXONE <=1 SENSITIVE Sensitive     CIPROFLOXACIN <=0.25 SENSITIVE Sensitive     GENTAMICIN <=1 SENSITIVE Sensitive     IMIPENEM <=0.25 SENSITIVE Sensitive     NITROFURANTOIN 64 INTERMEDIATE Intermediate     TRIMETH/SULFA <=20 SENSITIVE Sensitive     PIP/TAZO 8 SENSITIVE Sensitive     * 80,000 COLONIES/ml ENTEROBACTER CLOACAE     Labs: Basic Metabolic Panel:  Recent Labs Lab 03/19/15 0415 03/20/15 0453 03/21/15 0536 03/22/15 0515 03/23/15 0531 03/24/15 0534  NA 131* 136 139 139 133* 134*  K 3.0* 4.6 4.4 3.9 3.8 4.2  CL 103 109 107 104 98* 99*  CO2 20* 23 28 29 27 26   GLUCOSE 149* 106* 107* 110* 158* 108*  BUN 27* 16 8 6 10 11   CREATININE 1.07* 0.83 0.64 0.62 0.68 0.71  CALCIUM 8.3* 7.9* 8.5* 8.9 8.9 9.5  MG 1.7 2.5*  --   --   --   --    Liver Function Tests:  Recent Labs Lab 03/18/15 1610 03/19/15 0415  AST 18 15  ALT 14 12*  ALKPHOS 71 66  BILITOT 1.3* 1.1  PROT 7.5 6.9  ALBUMIN 3.7 3.3*    Recent Labs Lab 03/18/15 1610  LIPASE 22   No results for input(s): AMMONIA in the last 168 hours. CBC:  Recent Labs Lab 03/18/15 1610 03/19/15 0415 03/20/15 0453 03/22/15 0515  WBC 23.9* 18.0* 13.0* 9.7  NEUTROABS  --  15.3*  --   --   HGB 12.6 11.3* 10.0* 10.3*  HCT 37.9 33.4* 30.1* 30.5*  MCV 91.5 90.5 91.2 91.6  PLT 197 190 185 270   Cardiac Enzymes:  Recent Labs Lab  03/19/15 0415 03/19/15 1030 03/19/15 1649  TROPONINI <0.03 <0.03 <0.03   BNP: BNP (last 3 results)  Recent Labs  08/27/14 0221  BNP 48.5    ProBNP (last 3 results)  Recent  Labs  10/08/14 1613  PROBNP 52.0    CBG: No results for input(s): GLUCAP in the last 168 hours.     Signed:  Kemauri Musa A  [facility] 03/24/2015, 10:07 AM

## 2015-03-24 NOTE — Progress Notes (Signed)
Spoke with pt who had no preference for [facility]. Pt discharging home with [facility] for HHPT.

## 2015-03-24 NOTE — Progress Notes (Signed)
Patient discharged home, discharge instructions given and explained to patient and she verbalized understanding, denies any pain/distress; no wound noted and skin intact. Accompanied home by sister, transported to the car by staff.

## 2015-03-26 ENCOUNTER — Encounter: Payer: Medicare Other | Admitting: Physical Therapy

## 2015-03-27 ENCOUNTER — Encounter: Payer: Medicare Other | Admitting: Internal Medicine

## 2015-03-27 ENCOUNTER — Other Ambulatory Visit: Payer: Self-pay

## 2015-03-27 ENCOUNTER — Telehealth: Payer: Self-pay | Admitting: Physical Therapy

## 2015-03-27 NOTE — Telephone Encounter (Signed)
Made patient aware that she will need MD order to resume PT due to recent hospitalization. Patient understands and we will cancel all future appointments. Patient will call to schedule when cleared to return by MD.

## 2015-03-31 ENCOUNTER — Encounter: Payer: Medicare Other | Admitting: Physical Therapy

## 2015-04-02 ENCOUNTER — Encounter: Payer: Medicare Other | Admitting: Physical Therapy

## 2015-04-03 ENCOUNTER — Emergency Department (HOSPITAL_COMMUNITY): Payer: Medicare Other

## 2015-04-03 ENCOUNTER — Emergency Department (HOSPITAL_COMMUNITY)
Admission: EM | Admit: 2015-04-03 | Discharge: 2015-04-03 | Disposition: A | Discharge status: Home / Self Care | Payer: Medicare Other | Attending: Emergency Medicine | Admitting: Emergency Medicine

## 2015-04-03 ENCOUNTER — Encounter (HOSPITAL_COMMUNITY): Payer: Self-pay | Admitting: *Deleted

## 2015-04-03 DIAGNOSIS — J45909 Unspecified asthma, uncomplicated: Secondary | ICD-10-CM | POA: Diagnosis not present

## 2015-04-03 DIAGNOSIS — Z95810 Presence of automatic (implantable) cardiac defibrillator: Secondary | ICD-10-CM | POA: Insufficient documentation

## 2015-04-03 DIAGNOSIS — Z85828 Personal history of other malignant neoplasm of skin: Secondary | ICD-10-CM | POA: Insufficient documentation

## 2015-04-03 DIAGNOSIS — I509 Heart failure, unspecified: Secondary | ICD-10-CM | POA: Insufficient documentation

## 2015-04-03 DIAGNOSIS — Z8739 Personal history of other diseases of the musculoskeletal system and connective tissue: Secondary | ICD-10-CM | POA: Diagnosis not present

## 2015-04-03 DIAGNOSIS — Z880 Allergy status to penicillin: Secondary | ICD-10-CM | POA: Insufficient documentation

## 2015-04-03 DIAGNOSIS — K219 Gastro-esophageal reflux disease without esophagitis: Secondary | ICD-10-CM | POA: Diagnosis not present

## 2015-04-03 DIAGNOSIS — Z8701 Personal history of pneumonia (recurrent): Secondary | ICD-10-CM | POA: Insufficient documentation

## 2015-04-03 DIAGNOSIS — Z87828 Personal history of other (healed) physical injury and trauma: Secondary | ICD-10-CM | POA: Insufficient documentation

## 2015-04-03 DIAGNOSIS — E669 Obesity, unspecified: Secondary | ICD-10-CM | POA: Diagnosis not present

## 2015-04-03 DIAGNOSIS — R05 Cough: Secondary | ICD-10-CM

## 2015-04-03 DIAGNOSIS — R059 Cough, unspecified: Secondary | ICD-10-CM

## 2015-04-03 DIAGNOSIS — F329 Major depressive disorder, single episode, unspecified: Secondary | ICD-10-CM | POA: Insufficient documentation

## 2015-04-03 DIAGNOSIS — F419 Anxiety disorder, unspecified: Secondary | ICD-10-CM | POA: Diagnosis not present

## 2015-04-03 DIAGNOSIS — Z9889 Other specified postprocedural states: Secondary | ICD-10-CM | POA: Insufficient documentation

## 2015-04-03 DIAGNOSIS — Z79899 Other long term (current) drug therapy: Secondary | ICD-10-CM | POA: Insufficient documentation

## 2015-04-03 DIAGNOSIS — I10 Essential (primary) hypertension: Secondary | ICD-10-CM | POA: Insufficient documentation

## 2015-04-03 LAB — BRAIN NATRIURETIC PEPTIDE: B Natriuretic Peptide: 75.1 pg/mL (ref 0.0–100.0)

## 2015-04-03 LAB — CBC WITH DIFFERENTIAL/PLATELET
Basophils Absolute: 0.1 10*3/uL (ref 0.0–0.1)
Basophils Relative: 1 %
Eosinophils Absolute: 0.4 10*3/uL (ref 0.0–0.7)
Eosinophils Relative: 5 %
HCT: 39.3 % (ref 36.0–46.0)
Hemoglobin: 12.7 g/dL (ref 12.0–15.0)
Lymphocytes Relative: 33 %
Lymphs Abs: 3 10*3/uL (ref 0.7–4.0)
MCH: 30.2 pg (ref 26.0–34.0)
MCHC: 32.3 g/dL (ref 30.0–36.0)
MCV: 93.6 fL (ref 78.0–100.0)
Monocytes Absolute: 0.8 10*3/uL (ref 0.1–1.0)
Monocytes Relative: 9 %
Neutro Abs: 4.6 10*3/uL (ref 1.7–7.7)
Neutrophils Relative %: 52 %
Platelets: 412 10*3/uL — ABNORMAL HIGH (ref 150–400)
RBC: 4.2 MIL/uL (ref 3.87–5.11)
RDW: 13.1 % (ref 11.5–15.5)
WBC: 8.9 10*3/uL (ref 4.0–10.5)

## 2015-04-03 LAB — BASIC METABOLIC PANEL
Anion gap: 8 (ref 5–15)
BUN: 11 mg/dL (ref 6–20)
CO2: 26 mmol/L (ref 22–32)
Calcium: 9.4 mg/dL (ref 8.9–10.3)
Chloride: 105 mmol/L (ref 101–111)
Creatinine, Ser: 0.87 mg/dL (ref 0.44–1.00)
GFR calc Af Amer: >60 mL/min (ref >60)
GFR calc non Af Amer: >60 mL/min (ref >60)
Glucose, Bld: 118 mg/dL — ABNORMAL HIGH (ref 65–99)
Potassium: 4.2 mmol/L (ref 3.5–5.1)
Sodium: 139 mmol/L (ref 135–145)

## 2015-04-03 MED ORDER — DIPHENHYDRAMINE HCL 25 MG PO CAPS
25.0000 mg | ORAL_CAPSULE | Freq: Once | ORAL | Status: AC
  Administered 2015-04-03: 25 mg via ORAL
  Filled 2015-04-03: qty 1

## 2015-04-03 MED ORDER — PROMETHAZINE-CODEINE 6.25-10 MG/5ML PO SYRP: 5.0000 mL | ORAL_SOLUTION | Freq: Four times a day (QID) | ORAL | Status: DC | PRN

## 2015-04-03 MED ORDER — PROMETHAZINE-CODEINE 6.25-10 MG/5ML PO SYRP: 5.0000 mL | ORAL_SOLUTION | Freq: Once | ORAL | Status: DC

## 2015-04-03 MED ORDER — RANITIDINE HCL 150 MG/10ML PO SYRP
150.0000 mg | ORAL_SOLUTION | Freq: Once | ORAL | Status: AC
  Administered 2015-04-03: 150 mg via ORAL
  Filled 2015-04-03: qty 10

## 2015-04-03 MED ORDER — DEXTROMETHORPHAN POLISTIREX ER 30 MG/5ML PO SUER
15.0000 mg | Freq: Once | ORAL | Status: AC
  Administered 2015-04-03: 15 mg via ORAL
  Filled 2015-04-03: qty 5

## 2015-04-03 MED ORDER — PROMETHAZINE-CODEINE 6.25-10 MG/5ML PO SYRP
5.0000 mL | ORAL_SOLUTION | Freq: Once | ORAL | Status: AC
  Administered 2015-04-03: 5 mL via ORAL
  Filled 2015-04-03: qty 5

## 2015-04-03 NOTE — ED Notes (Signed)
Pt presents via POV c/o cough and rash.  Pt was prescribed benzonatate for cough yesterday, now has rash to BL UE.  Pt also reports cough is not getting better, reports dx PNA at beginning of December.  Pt a x 4, NAD, speaking in complete sentences.  Reports dry cough, non productive.

## 2015-04-03 NOTE — Discharge Instructions (Signed)
Cough, Adult Coughing is a reflex that clears your throat and your airways. Coughing helps to heal and protect your lungs. It is normal to cough occasionally, but a cough that happens with other symptoms or lasts a long time may be a sign of a condition that needs treatment. A cough may last only 2-3 weeks (acute), or it may last longer than 8 weeks (chronic). CAUSES Coughing is commonly caused by:  Breathing in substances that irritate your lungs.  A viral or bacterial respiratory infection.  Allergies.  Asthma.  Postnasal drip.  Smoking.  Acid backing up from the stomach into the esophagus (gastroesophageal reflux).  Certain medicines.  Chronic lung problems, including COPD (or rarely, lung cancer).  Other medical conditions such as heart failure. HOME CARE INSTRUCTIONS  Pay attention to any changes in your symptoms. Take these actions to help with your discomfort:  Take medicines only as told by your health care provider.  If you were prescribed an antibiotic medicine, take it as told by your health care provider. Do not stop taking the antibiotic even if you start to feel better.  Talk with your health care provider before you take a cough suppressant medicine.  Drink enough fluid to keep your urine clear or pale yellow.  If the air is dry, use a cold steam vaporizer or humidifier in your bedroom or your home to help loosen secretions.  Avoid anything that causes you to cough at work or at home.  If your cough is worse at night, try sleeping in a semi-upright position.  Avoid cigarette smoke. If you smoke, quit smoking. If you need help quitting, ask your health care provider.  Avoid caffeine.  Avoid alcohol.  Rest as needed. SEEK MEDICAL CARE IF:   You have new symptoms.  You cough up pus.  Your cough does not get better after 2-3 weeks, or your cough gets worse.  You cannot control your cough with suppressant medicines and you are losing sleep.  You  develop pain that is getting worse or pain that is not controlled with pain medicines.  You have a fever.  You have unexplained weight loss.  You have night sweats. SEEK IMMEDIATE MEDICAL CARE IF:  You cough up blood.  You have difficulty breathing.  Your heartbeat is very fast.   This information is not intended to replace advice given to you by your health care provider. Make sure you discuss any questions you have with your health care provider.   Document Released: 09/24/2010 Document Revised: 12/17/2014 Document Reviewed: 06/04/2014 Elsevier Interactive Patient Education [employer].  Please use medication as directed, to continue to have allergic type reaction please discontinue medication. Please contact primary care provider and schedule his in 3 days for reevaluation. Please continue using Benadryl and Zantac as needed for allergic reaction. If any new or worsening signs or symptoms present including worsening allergic reaction please return to the emergency room immediately for further evaluation and management.

## 2015-04-03 NOTE — ED Notes (Signed)
Patient transported to X-ray 

## 2015-04-03 NOTE — ED Provider Notes (Signed)
CSN: [id number]     Arrival date & time 04/03/15  [id number] History   First MD Initiated Contact with Patient 04/03/15 1015     Chief Complaint  Patient presents with  . Cough   HPI   69 year old female presents today with cough. Patient reports a significant past medical history of congestive heart failure, recent pneumonia. She reports that Wednesday she developed a cough, and worsening orthopnea. Patient was discharged on 03/24/2015 with a diagnosis of sepsis and community-acquired pneumonia. She reports last antibiotics were approximately one week ago with improvement in symptoms since discharge. She reports the cough is nonproductive, dry, and not improved with over-the-counter medications. She reports the orthopnea is worsening to the point where she is unable to lie completely flat. She denies any lower extremity swelling or edema, hemoptysis, chest pain, fever, chills, nausea, vomiting. She reports she's been taking her prescribed medications including Lasix as directed. She reports talking to her primary care provider yesterday who prescribed her Ladona Ridgel which caused a rash to her upper and lower extremities. She reports it's extremely itchy, attempted using Benadryl last night with no improvement in symptoms. She denies any abdominal complaints, respiratory complaints ( other than cough noted as above) , or swelling of the throat or mouth.  Past Medical History  Diagnosis Date  . Asthma   . GERD (gastroesophageal reflux disease)   . Hypertension   . ICD (implantable cardiac defibrillator) in place Dec 2013  . Shortness of breath     "@ any time I can get SOB" (03/22/2012)  . Migraines   . Skin cancer 1999  . WPW (Wolff-Parkinson-White syndrome)   . Pacemaker   . CHF (congestive heart failure) ([place]) 6/13  . Pneumonia     hx  . Anxiety   . NICM (nonischemic cardiomyopathy) ([place])   . Obesity (BMI 30-39.9)     negative sleep study 2014  . Head injury, acute, with loss of  consciousness ([place])   . Depression   . Arthritis     "hands and knees" (03/22/2012)   Past Surgical History  Procedure Laterality Date  . Cardiac defibrillator placement  03/22/2012    DDD  . Cholecystectomy  ~ 2003  . Tonsillectomy and adenoidectomy  1950  . Skin cancer excision  1999    "top of my head and my right back shoulder"  . Mohs surgery  06/2011    "forehead" (03/22/2012)  . Supraventricular tachycardia ablation  03/22/2012    unable to induce VT/RN report 03/22/2012  . Tubal ligation  1978  . Kyphoplasty N/A 11/27/2013    Procedure: KYPHOPLASTY;  Surgeon: Sinclair Ship, MD;  Location: [place];  Service: Orthopedics;  Laterality: N/A;  T9, T11 kyphoplasty  . Cardiac catheterization  09/18/11    no significant CAD  . Supraventricular tachycardia ablation N/A 03/22/2012    Procedure: SUPRAVENTRICULAR TACHYCARDIA ABLATION;  Surgeon: Evans Lance, MD;  Location: [facility] CATH LAB;  Service: Cardiovascular;  Laterality: N/A;  . Implantable cardioverter defibrillator implant N/A 03/22/2012    Procedure: IMPLANTABLE CARDIOVERTER DEFIBRILLATOR IMPLANT;  Surgeon: Evans Lance, MD;  Location: [facility] C[facility] LAB;  Service: Cardiovascular;  Laterality: N/A;  . Skin cancer excision      back  . Skin cancer excision      face  . Kyphoplasty N/A 06/05/2014    Procedure: KYPHOPLASTY;  Surgeon: Sinclair Ship, MD;  Location: [place];  Service: Orthopedics;  Laterality: N/A;  T7 kyphoplasty   Family History  Problem Relation Age of Onset  . Tuberculosis Paternal Grandfather   . Congestive Heart Failure Brother   . Atrial fibrillation Brother   . Congestive Heart Failure Mother     died 46  . Kidney Stones Mother   . Deafness Mother   . Atrial fibrillation Mother   . Congestive Heart Failure Father   . Deafness Father   . Healthy Sister   . Healthy Sister   . Healthy Sister    Social History  Substance Use Topics  . Smoking status: Never Smoker   . Smokeless tobacco: Never  Used  . Alcohol Use: No   OB History    No data available     Review of Systems  All other systems reviewed and are negative.   Allergies  Ivp dye; Penicillins; Sulfonamide derivatives; Onion; and Tessalon  Home Medications   Prior to Admission medications   Medication Sig Start Date End Date Taking? Authorizing Provider  albuterol (PROVENTIL HFA;VENTOLIN HFA) 108 (90 BASE) MCG/ACT inhaler Inhale 2 puffs into the lungs every 6 (six) hours as needed. For wheezing   Yes Historical Provider, MD  alendronate (FOSAMAX) 70 MG tablet Take 70 mg by mouth once a week. Take with a full glass of water on an empty stomach.   Yes Historical Provider, MD  carvedilol (COREG) 12.5 MG tablet Take 1 tablet (12.5 mg total) by mouth 2 (two) times daily with a meal. 03/27/14  Yes Jettie Booze, MD  cetirizine (ZYRTEC) 10 MG tablet Take 10 mg by mouth daily.   Yes Historical Provider, MD  Cholecalciferol (VITAMIN D3) 1000 UNITS CAPS Take 1,000 Units by mouth daily.    Yes Historical Provider, MD  citalopram (CELEXA) 20 MG tablet Take 20 mg by mouth daily.   Yes Historical Provider, MD  fluticasone (FLONASE) 50 MCG/ACT nasal spray Place 1 spray into both nostrils daily as needed for allergies.  01/22/14  Yes Historical Provider, MD  furosemide (LASIX) 40 MG tablet TAKE ONE TABLET BY MOUTH ONCE DAILY 10/24/14  Yes Jettie Booze, MD  gelatin 650 MG capsule Take 1,300 mg by mouth 2 (two) times daily.    Yes Historical Provider, MD  loperamide (IMODIUM) 2 MG capsule Take 1 capsule (2 mg total) by mouth as needed for diarrhea or loose stools (Do not exceed 8 times per day). 03/24/15  Yes Verlee Monte, MD  losartan (COZAAR) 25 MG tablet Take 1 tablet (25 mg total) by mouth 2 (two) times daily. Patient taking differently: Take 50 mg by mouth 2 (two) times daily.  01/02/15  Yes Jettie Booze, MD  Multiple Vitamins-Minerals (MULTIVITAMIN PO) Take 1 tablet by mouth daily. ALIVE   Yes Historical  Provider, MD  omeprazole (PRILOSEC) 20 MG capsule Take 20 mg by mouth daily.    Yes Historical Provider, MD  potassium chloride (K-DUR) 10 MEQ tablet Take 10 mEq by mouth 2 (two) times daily.   Yes Historical Provider, MD  saccharomyces boulardii (FLORASTOR) 250 MG capsule Take 1 capsule (250 mg total) by mouth 2 (two) times daily. 03/24/15  Yes Verlee Monte, MD  spironolactone (ALDACTONE) 25 MG tablet Take 1 tablet (25 mg total) by mouth daily. 03/27/14  Yes Jettie Booze, MD  promethazine-codeine [facility] WITH CODEINE) 6.25-10 MG/5ML syrup Take 5 mLs by mouth once. 04/03/15   Marina Desire, PA-C   BP 140/68 mmHg  Pulse 80  Temp(Src) 98.8 F (37.1 C) (Oral)  Resp 18  Ht 5\' 3"  (1.6 m)  Wt  91.173 kg  BMI 35.61 kg/m2  SpO2 99%   Physical Exam  Constitutional: She is oriented to person, place, and time. She appears well-developed and well-nourished.  HENT:  Head: Normocephalic and atraumatic.  Nose: Nose normal. No rhinorrhea.  Mouth/Throat: Uvula is midline, oropharynx is clear and moist and mucous membranes are normal. No oropharyngeal exudate, posterior oropharyngeal edema, posterior oropharyngeal erythema or tonsillar abscesses.  Eyes: Conjunctivae are normal. Pupils are equal, round, and reactive to light. Right eye exhibits no discharge. Left eye exhibits no discharge. No scleral icterus.  Neck: Normal range of motion. No JVD present. No tracheal deviation present.  Cardiovascular: Normal rate, regular rhythm and intact distal pulses.  Exam reveals no gallop and no friction rub.   No murmur heard. Pulmonary/Chest: Effort normal and breath sounds normal. No stridor. No respiratory distress. She has no wheezes. She has no rales. She exhibits no tenderness.  Dry nonproductive cough  Musculoskeletal: Normal range of motion. She exhibits no edema or tenderness.  Neurological: She is alert and oriented to person, place, and time. Coordination normal.  Skin: Skin is warm and dry.  No rash noted. No erythema. No pallor.  Psychiatric: She has a normal mood and affect. Her behavior is normal. Judgment and thought content normal.  Nursing note and vitals reviewed.   ED Course  Procedures (including critical care time) Labs Review Labs Reviewed  CBC WITH DIFFERENTIAL/PLATELET - Abnormal; Notable for the following:    Platelets 412 (*)    All other components within normal limits  BASIC METABOLIC PANEL - Abnormal; Notable for the following:    Glucose, Bld 118 (*)    All other components within normal limits  BRAIN NATRIURETIC PEPTIDE    Imaging Review Dg Chest 2 View  04/03/2015  CLINICAL DATA:  Chest pain and cough EXAM: CHEST - 2 VIEW COMPARISON:  03/18/2015 FINDINGS: Cardiac shadow is stable. A defibrillator is again noted and stable. Changes of prior vertebral augmentation are seen. The lungs are clear. The cardiac shadow is within normal limits. IMPRESSION: No acute abnormality noted. Electronically Signed   By: Inez Catalina M.D.   On: 04/03/2015 11:24   I have personally reviewed and evaluated these images and lab results as part of my medical decision-making.   EKG Interpretation None      MDM   Final diagnoses:  Cough    Labs: CBC, BMP and BNP- elevated platelets 412  Imaging: DG chest 2 view shows no acute abnormality  Consults:  Therapeutics: Benadryl, promethazine/codeine, Delsym, Zantac  Discharge Meds: Promethazine/codeine  Assessment/Plan: Patient presents with a cough today. Initial concern for worsening heart failure due to patient's reported symptoms, she has no lower extremity swelling or edema, no elevated BNP, with a normal chest x-ray. Low suspicion for pneumonia, or worsening CHF. Patient's cough is nonproductive again no significant findings on chest x-ray. Patient was given benzonatate for cough yesterday without primary care provider and developed a rash. Patient was given Benadryl and Zantac which provided some relief in the  itching. Patient has no anaphylactic signs or symptoms. Several antitussive medications were attempted here in the ED. Patient was finally given promethazine with codeine, although she is allergic to benzonatate she reports taking Phenergan prior without allergic reaction. She was given the antitussive, family notes that she had no more cough until I came into the room again. Patient will be discharged home with above medications, and instructed to continue using Zantac and Benadryl as needed for reaction. She is instructed  to discontinue taking cough medication if any new or worsening signs or symptoms present. She is instructed to return to the emergency room for further evaluation and management if any new or worsening signs or symptoms present. Patient verbalized her understanding and agreement for today's plan and had no further questions concerns at time of discharge.        Okey Regal, PA-C 04/03/15 1458  Leo Grosser, MD 04/04/15 [phone]

## 2015-04-03 NOTE — ED Notes (Signed)
Pt refusing to keep BP cuff on d/t itching.

## 2015-04-07 ENCOUNTER — Encounter: Payer: Medicare Other | Admitting: Physical Therapy

## 2015-04-09 ENCOUNTER — Encounter: Payer: Medicare Other | Admitting: Physical Therapy

## 2015-04-13 ENCOUNTER — Other Ambulatory Visit: Payer: Self-pay | Admitting: Interventional Cardiology

## 2015-04-14 ENCOUNTER — Encounter: Payer: Medicare Other | Admitting: Physical Therapy

## 2015-04-16 ENCOUNTER — Encounter: Payer: Medicare Other | Admitting: Physical Therapy

## 2015-04-28 ENCOUNTER — Encounter: Payer: Self-pay | Admitting: Internal Medicine

## 2015-04-28 ENCOUNTER — Ambulatory Visit (INDEPENDENT_AMBULATORY_CARE_PROVIDER_SITE_OTHER): Payer: Medicare Other | Admitting: Internal Medicine

## 2015-04-28 VITALS — BP 122/80 | HR 87 | Ht 63.0 in | Wt 206.8 lb

## 2015-04-28 DIAGNOSIS — I428 Other cardiomyopathies: Secondary | ICD-10-CM

## 2015-04-28 DIAGNOSIS — Z95810 Presence of automatic (implantable) cardiac defibrillator: Secondary | ICD-10-CM

## 2015-04-28 DIAGNOSIS — E669 Obesity, unspecified: Secondary | ICD-10-CM | POA: Diagnosis not present

## 2015-04-28 DIAGNOSIS — I10 Essential (primary) hypertension: Secondary | ICD-10-CM | POA: Diagnosis not present

## 2015-04-28 DIAGNOSIS — I429 Cardiomyopathy, unspecified: Secondary | ICD-10-CM | POA: Diagnosis not present

## 2015-04-28 DIAGNOSIS — I5022 Chronic systolic (congestive) heart failure: Secondary | ICD-10-CM | POA: Diagnosis not present

## 2015-04-28 LAB — CUP PACEART INCLINIC DEVICE CHECK
Battery Remaining Longevity: 99 mo
Brady Statistic AP VP Percent: 0.1 % — CL
Date Time Interrogation Session: 20170117135002
HighPow Impedance: 79 Ohm
Implantable Lead Implant Date: 20131212
Implantable Lead Implant Date: 20131212
Implantable Lead Location: 753859
Lead Channel Impedance Value: 513 Ohm
Lead Channel Pacing Threshold Pulse Width: 0.4 ms
Lead Channel Pacing Threshold Pulse Width: 0.4 ms
Lead Channel Setting Pacing Amplitude: 2 V
Lead Channel Setting Pacing Amplitude: 2.5 V
Lead Channel Setting Pacing Pulse Width: 0.4 ms
MDC IDC LEAD LOCATION: 753860
MDC IDC LEAD MODEL: 6935
MDC IDC MSMT LEADCHNL RA PACING THRESHOLD AMPLITUDE: 0.5 V
MDC IDC MSMT LEADCHNL RA SENSING INTR AMPL: 3.4 mV
MDC IDC MSMT LEADCHNL RV IMPEDANCE VALUE: 817 Ohm
MDC IDC MSMT LEADCHNL RV PACING THRESHOLD AMPLITUDE: 0.5 V
MDC IDC MSMT LEADCHNL RV SENSING INTR AMPL: 11 mV
MDC IDC SET LEADCHNL RV SENSING SENSITIVITY: 0.3 mV
MDC IDC STAT BRADY AP VS PERCENT: 6.7 %
MDC IDC STAT BRADY AS VP PERCENT: 0.1 % — AB
MDC IDC STAT BRADY AS VS PERCENT: 93.3 %

## 2015-04-28 MED ORDER — CARVEDILOL 12.5 MG PO TABS: 12.5000 mg | ORAL_TABLET | Freq: Two times a day (BID) | ORAL | Status: DC

## 2015-04-28 NOTE — Assessment & Plan Note (Signed)
She is encouraged to increase her physical activity and to lose weight.

## 2015-04-28 NOTE — Assessment & Plan Note (Signed)
Her blood pressure is well controlled. No change in meds. 

## 2015-04-28 NOTE — Progress Notes (Signed)
HPI Paula Booker returns today for followup. She is a pleasant 70 yo woman with a non-ischemic CM, chronic systolic heart failure, syncope and a WPW pattern on her ECG who underwent EP study with no inducible arrhythmias followed by ICD implant 2 years ago. She has sustained a back fracture, and has become more sedentary. She denies any ICD shocks.  No other complaints. No peripheral edema. She remains active. Allergies  Allergen Reactions  . Ivp Dye [Iodinated Diagnostic Agents] Anaphylaxis, Hives, Swelling and Other (See Comments)    Throat swell  . Penicillins Anaphylaxis    Has patient had a PCN reaction causing immediate rash, facial/tongue/throat swelling, SOB or lightheadedness with hypotension: No Has patient had a PCN reaction causing severe rash involving mucus membranes or skin necrosis: No Has patient had a PCN reaction that required hospitalization No Has patient had a PCN reaction occurring within the last 10 years:No If all of the above answers are "NO", then may proceed with Cephalosporin use.  . Sulfonamide Derivatives Other (See Comments)    Migraines  . Tessalon [Benzonatate] Rash    Severe rash  . Onion Other (See Comments)    Raw onions cause migraines     Current Outpatient Prescriptions  Medication Sig Dispense Refill  . albuterol (PROVENTIL HFA;VENTOLIN HFA) 108 (90 BASE) MCG/ACT inhaler Inhale 2 puffs into the lungs every 6 (six) hours as needed. For wheezing    . alendronate (FOSAMAX) 70 MG tablet Take 70 mg by mouth once a week. Take with a full glass of water on an empty stomach.    . carvedilol (COREG) 12.5 MG tablet Take 1 tablet (12.5 mg total) by mouth 2 (two) times daily with a meal. 180 tablet 3  . cetirizine (ZYRTEC) 10 MG tablet Take 10 mg by mouth daily.    . Cholecalciferol (VITAMIN D3) 1000 UNITS CAPS Take 1,000 Units by mouth daily.     . fluticasone (FLONASE) 50 MCG/ACT nasal spray Place 1 spray into both nostrils daily as needed for  allergies.     . furosemide (LASIX) 40 MG tablet TAKE ONE TABLET BY MOUTH ONCE DAILY 30 tablet 10  . loperamide (IMODIUM) 2 MG capsule Take 1 capsule (2 mg total) by mouth as needed for diarrhea or loose stools (Do not exceed 8 times per day). 30 capsule 0  . omeprazole (PRILOSEC) 40 MG capsule Take 40 mg by mouth daily.    Marland Kitchen spironolactone (ALDACTONE) 25 MG tablet TAKE ONE TABLET BY MOUTH ONCE DAILY 90 tablet 1   No current facility-administered medications for this visit.     Past Medical History  Diagnosis Date  . Asthma   . GERD (gastroesophageal reflux disease)   . Hypertension   . ICD (implantable cardiac defibrillator) in place Dec 2013  . Shortness of breath     "@ any time I can get SOB" (03/22/2012)  . Migraines   . Skin cancer 1999  . WPW (Wolff-Parkinson-White syndrome)   . Pacemaker   . CHF (congestive heart failure) ([place]) 6/13  . Pneumonia     hx  . Anxiety   . NICM (nonischemic cardiomyopathy) ([place])   . Obesity (BMI 30-39.9)     negative sleep study 2014  . Head injury, acute, with loss of consciousness ([place])   . Depression   . Arthritis     "hands and knees" (03/22/2012)    ROS:   All systems reviewed and negative except as noted in the HPI.  Past Surgical History  Procedure Laterality Date  . Cardiac defibrillator placement  03/22/2012    DDD  . Cholecystectomy  ~ 2003  . Tonsillectomy and adenoidectomy  1950  . Skin cancer excision  1999    "top of my head and my right back shoulder"  . Mohs surgery  06/2011    "forehead" (03/22/2012)  . Supraventricular tachycardia ablation  03/22/2012    unable to induce VT/RN report 03/22/2012  . Tubal ligation  1978  . Kyphoplasty N/A 11/27/2013    Procedure: KYPHOPLASTY;  Surgeon: Sinclair Ship, MD;  Location: [place];  Service: Orthopedics;  Laterality: N/A;  T9, T11 kyphoplasty  . Cardiac catheterization  09/18/11    no significant CAD  . Supraventricular tachycardia ablation N/A 03/22/2012     Procedure: SUPRAVENTRICULAR TACHYCARDIA ABLATION;  Surgeon: Evans Lance, MD;  Location: [facility] CATH LAB;  Service: Cardiovascular;  Laterality: N/A;  . Implantable cardioverter defibrillator implant N/A 03/22/2012    Procedure: IMPLANTABLE CARDIOVERTER DEFIBRILLATOR IMPLANT;  Surgeon: Evans Lance, MD;  Location: [facility] CATH LAB;  Service: Cardiovascular;  Laterality: N/A;  . Skin cancer excision      back  . Skin cancer excision      face  . Kyphoplasty N/A 06/05/2014    Procedure: KYPHOPLASTY;  Surgeon: Sinclair Ship, MD;  Location: [place];  Service: Orthopedics;  Laterality: N/A;  T7 kyphoplasty     Family History  Problem Relation Age of Onset  . Tuberculosis Paternal Grandfather   . Congestive Heart Failure Brother   . Atrial fibrillation Brother   . Congestive Heart Failure Mother     died 24  . Kidney Stones Mother   . Deafness Mother   . Atrial fibrillation Mother   . Congestive Heart Failure Father   . Deafness Father   . Healthy Sister   . Healthy Sister   . Healthy Sister      Social History   Social History  . Marital Status: Divorced    Spouse Name: N/A  . Number of Children: 3  . Years of Education: N/A   Occupational History  . retired     Optometrist   Social History Main Topics  . Smoking status: Never Smoker   . Smokeless tobacco: Never Used  . Alcohol Use: No  . Drug Use: No  . Sexual Activity: Not on file   Other Topics Concern  . Not on file   Social History Narrative   Divorced, 3 children, 9 grandchildren     BP 122/80 mmHg  Pulse 87  Ht 5\' 3"  (1.6 m)  Wt 206 lb 12.8 oz (93.804 kg)  BMI 36.64 kg/m2  SpO2 96%  Physical Exam:  Stable appearing 70 yo woman, NAD HEENT: Unremarkable Neck:  No JVD, no thyromegally, no bruits Back:  No CVA tenderness Lungs:  Clear with no wheezes HEART:  Regular rate rhythm, no murmurs, no rubs, no clicks Abd:  soft, obese, positive bowel sounds, no organomegally, no rebound, no guarding Ext:   2 plus pulses, no edema, no cyanosis, no clubbing Skin:  No rashes no nodules Neuro:  CN II through XII intact, motor grossly intact   DEVICE  Normal device function.  See PaceArt for details.   Assess/Plan:

## 2015-04-28 NOTE — Assessment & Plan Note (Signed)
Her symptoms are class 2. She is encouraged to reduce her sodium intake. No medication changes.

## 2015-04-28 NOTE — Patient Instructions (Signed)

## 2015-04-28 NOTE — Assessment & Plan Note (Signed)
Her medtronic DDD Chamber ICD is working normally. She has had some VT below her detection zone and we reprogrammed her detection to 194/min.

## 2015-06-08 ENCOUNTER — Ambulatory Visit: Payer: Medicare Other | Attending: Orthopedic Surgery

## 2015-06-08 DIAGNOSIS — M62830 Muscle spasm of back: Secondary | ICD-10-CM | POA: Diagnosis present

## 2015-06-08 DIAGNOSIS — M2560 Stiffness of unspecified joint, not elsewhere classified: Secondary | ICD-10-CM

## 2015-06-08 DIAGNOSIS — M545 Low back pain, unspecified: Secondary | ICD-10-CM

## 2015-06-08 DIAGNOSIS — M546 Pain in thoracic spine: Secondary | ICD-10-CM | POA: Diagnosis present

## 2015-06-08 DIAGNOSIS — R6889 Other general symptoms and signs: Secondary | ICD-10-CM

## 2015-06-08 NOTE — Therapy (Signed)
[place], [place], [address] Phone: [phone]   Fax:  [phone]  Physical Therapy Evaluation  Patient Details  Name: Paula Booker MRN: [id number] Date of Birth: 1945/11/22 Referring Provider: Phylliss Bob, MD  Encounter Date: 06/08/2015      PT End of Session - 06/08/15 1455    Visit Number 1   Number of Visits 16   Date for PT Re-Evaluation 08/03/15   PT Start Time 0215   PT Stop Time 0310   PT Time Calculation (min) 55 min   Activity Tolerance Patient tolerated treatment well;No increased pain   Behavior During Therapy [facility] for tasks assessed/performed      Past Medical History  Diagnosis Date  . Asthma   . GERD (gastroesophageal reflux disease)   . Hypertension   . ICD (implantable cardiac defibrillator) in place Dec 2013  . Shortness of breath     "@ any time I can get SOB" (03/22/2012)  . Migraines   . Skin cancer 1999  . WPW (Wolff-Parkinson-White syndrome)   . Pacemaker   . CHF (congestive heart failure) ([place]) 6/13  . Pneumonia     hx  . Anxiety   . NICM (nonischemic cardiomyopathy) ([place])   . Obesity (BMI 30-39.9)     negative sleep study 2014  . Head injury, acute, with loss of consciousness ([place])   . Depression   . Arthritis     "hands and knees" (03/22/2012)    Past Surgical History  Procedure Laterality Date  . Cardiac defibrillator placement  03/22/2012    DDD  . Cholecystectomy  ~ 2003  . Tonsillectomy and adenoidectomy  1950  . Skin cancer excision  1999    "top of my head and my right back shoulder"  . Mohs surgery  06/2011    "forehead" (03/22/2012)  . Supraventricular tachycardia ablation  03/22/2012    unable to induce VT/RN report 03/22/2012  . Tubal ligation  1978  . Kyphoplasty N/A 11/27/2013    Procedure: KYPHOPLASTY;  Surgeon: Sinclair Ship, MD;  Location: [place];  Service: Orthopedics;  Laterality: N/A;  T9, T11 kyphoplasty  . Cardiac  catheterization  09/18/11    no significant CAD  . Supraventricular tachycardia ablation N/A 03/22/2012    Procedure: SUPRAVENTRICULAR TACHYCARDIA ABLATION;  Surgeon: Evans Lance, MD;  Location: [facility] CATH LAB;  Service: Cardiovascular;  Laterality: N/A;  . Implantable cardioverter defibrillator implant N/A 03/22/2012    Procedure: IMPLANTABLE CARDIOVERTER DEFIBRILLATOR IMPLANT;  Surgeon: Evans Lance, MD;  Location: [facility] CATH LAB;  Service: Cardiovascular;  Laterality: N/A;  . Skin cancer excision      back  . Skin cancer excision      face  . Kyphoplasty N/A 06/05/2014    Procedure: KYPHOPLASTY;  Surgeon: Sinclair Ship, MD;  Location: [place];  Service: Orthopedics;  Laterality: N/A;  T7 kyphoplasty    There were no vitals filed for this visit.  Visit Diagnosis:  Bilateral low back pain without sciatica  Bilateral thoracic back pain  Joint stiffness of spine  Activity intolerance  Back muscle spasm      Subjective Assessment - 06/08/15 1419    Subjective Fell 10/2013 (while painting home) of ladder and fell 04/2014 when tripped on step stool.   Kyphoplasty in 3 vertebrae in thoracic spine. .  She reported she had significant illness and was hospitalized  until 03/25/2015.      Limitations Lifting;House hold activities  bending to  do work/activity/bake   How long can you sit comfortably? 20 min   How long can you stand comfortably? 15 min   How long can you walk comfortably? 200-300 feet   Patient Stated Goals To get where I can work out pain. Feel better. Walk on tread mill for exercise    Currently in Pain? Yes   Pain Score 8    Pain Location Back   Pain Orientation Lower;Right;Posterior   Pain Descriptors / Indicators Burning  Knot   Pain Type Chronic pain   Pain Onset More than a month ago   Pain Frequency Constant   Aggravating Factors  standing ,bending , doing dishes and home tasks   Pain Relieving Factors Lying flat, medication,    Multiple Pain Sites No             [facility] PT Assessment - 06/08/15 1416    Assessment   Medical Diagnosis Lumbar and thoracic pain   Referring Provider Phylliss Bob, MD   Onset Date/Surgical Date --  10/2013, fall and 04/2015 fell   Next MD Visit PRN   Prior Therapy She was here in NOvember 2016 but did not return   Precautions   Precaution Comments NO ELECTRICAL STIM  PACEMANDER /DEFIB IN PLACE  limit lifting heavy weight   Restrictions   Weight Bearing Restrictions No   Balance Screen   Has the patient fallen in the past 6 months No   Has the patient had a decrease in activity level because of a fear of falling?  No   Is the patient reluctant to leave their home because of a fear of falling?  No   Home Environment   Living Environment Private residence   Living Arrangements Alone   Type of [place]   Prior Function   Level of Independence Independent   Cognition   Overall Cognitive Status Within Functional Limits for tasks assessed   Posture/Postural Control   Posture Comments Increased lower thoracic kyphosis , 20 degrees lumbar pordosis   AROM   AROM Assessment Site Lumbar   Lumbar Flexion 80   Lumbar Extension 20   Lumbar - Right Side Bend 30   Lumbar - Left Side Bend 30   Strength   Overall Strength Comments LE WNL bilaterally   Flexibility   Hamstrings 70 DEGREES BILATERALLY   Ambulation/Gait   Ambulation/Gait No                           PT Education - 06/08/15 1455    Education provided Yes   Education Details POC, HEP dry needling info   Person(s) Educated Patient   Methods Explanation;Tactile cues;Verbal cues;Handout   Comprehension Returned demonstration;Verbalized understanding          PT Short Term Goals - 06/08/15 1514    PT SHORT TERM GOAL #1   Title She will be independent with initial HEP   Time 4   Period Weeks   Status New   PT SHORT TERM GOAL #2   Title She will report pain eased 25% or more    Time 4   Period Weeks   Status New   PT  SHORT TERM GOAL #3   Title She will report improved tolerance to walking in [employer]   Time 4   Period Weeks   Status New           PT Long Term Goals - 06/08/15 1514  PT LONG TERM GOAL #1   Title She will be independent with all HEP issued as of last visit   Time 8   Period Weeks   Status New   PT LONG TERM GOAL #2   Title She will report with daily home tasks pain eased 50%     Time 8   Period Weeks   Status New   PT LONG TERM GOAL #3   Title She will report being able to walk to shop in [place] without increased pain   Time 8   Period Weeks   Status New   PT LONG TERM GOAL #4   Title She will report being able to sit for 60 min without incr pain.   Time 8   Period Weeks   Status New   PT LONG TERM GOAL #5   Title She will report able to do dishes and cut fabric with minmal to no increase in pain   Time 8   Period Weeks   Status New               Plan - June 14, 2015 1456    Clinical Impression Statement Mrs Segarra returns with essentially the same range and strength and pain levels but is more limited in functional tolerance to activity. She is very tneder along RT paraspinasl from upper tthoracic to lowest lumar spine an d both glutes/piriformis muscles.    Pt will benefit from skilled therapeutic intervention in order to improve on the following deficits Pain;Postural dysfunction;Increased muscle spasms;Decreased activity tolerance;Decreased range of motion   Rehab Potential Good   PT Frequency 2x / week   PT Duration 8 weeks   PT Treatment/Interventions Cryotherapy;Iontophoresis 4mg /ml Dexamethasone;Moist Heat;Ultrasound;Therapeutic exercise;Patient/family education;Taping;Manual techniques;Dry needling;Passive range of motion   PT Next Visit Plan REview HEP., manual , needling, heat, posture ed.    PT Home Exercise Plan tranver abdom stretching ,    Consulted and Agree with Plan of Care Patient          G-Codes - 06-14-2015 1529    Functional Assessment  Tool Used Clinical judgement   Functional Limitation Changing and maintaining body position   Changing and Maintaining Body Position Current Status [id number]) At least 40 percent but less than 60 percent impaired, limited or restricted   Changing and Maintaining Body Position Goal Status [id number]) At least 20 percent but less than 40 percent impaired, limited or restricted       Problem List Patient Active Problem List   Diagnosis Date Noted  . Chronic systolic heart failure ([place]) 04/28/2015  . UTI (urinary tract infection) 03/22/2015  . Sepsis ([place]) 03/19/2015  . Acute kidney injury ([place]) 03/19/2015  . Diarrhea 03/18/2015  . CAP (community acquired pneumonia) 03/18/2015  . Nausea vomiting and diarrhea 03/18/2015  . Compression fracture 06/05/2014  . Essential hypertension 03/27/2014  . Chest pain 01/27/2014  . Normal coronary arteries 01/27/2014  . Obesity (BMI 30-39.9)   . ICD- MDT implanted Dec 2013 03/26/2013  . NICM (nonischemic cardiomyopathy) ([place]) 02/21/2013  . GERD (gastroesophageal reflux disease) 04/23/2012  . Asthma exacerbation 04/23/2012  . Shortness of breath 04/23/2012  . Syncope 03/01/2012  . Acute on chronic systolic CHF (congestive heart failure) ([place]) 03/01/2012  . Type B WPW syndrome- RFA Dec 2013 03/01/2012    Darrel Hoover PT June 14, 2015, 3:30 PM  [facility] [address] [place], [place], [address] Phone: [phone]   Fax:  [phone]  Name: Paula Booker MRN: [id number]  Date of Birth: [birth year]

## 2015-06-08 NOTE — Patient Instructions (Signed)
Issued from cabinet transverse abdominus with pelvic floor x10 5-10 sec hold, post pelvic tilt x10-15 5-10 sec hold , knee to chest stretch 20-30 sec x 3 RT and LT,  Dry needle  Info, posture and body mechanics hand out                                                                                                                                                Sleeping on Back  Place pillow under knees. A pillow with cervical support and a roll around waist are also helpful. Copyright  VHI. All rights reserved.  Sleeping on Side Place pillow between knees. Use cervical support under neck and a roll around waist as needed. Copyright  VHI. All rights reserved.   Sleeping on Stomach   If this is the only desirable sleeping position, place pillow under lower legs, and under stomach or chest as needed.  Posture - Sitting   Sit upright, head facing forward. Try using a roll to support lower back. Keep shoulders relaxed, and avoid rounded back. Keep hips level with knees. Avoid crossing legs for long periods. Stand to Sit / Sit to Stand   To sit: Bend knees to lower self onto front edge of chair, then scoot back on seat. To stand: Reverse sequence by placing one foot forward, and scoot to front of seat. Use rocking motion to stand up.   Work Height and Reach  Ideal work height is no more than 2 to 4 inches below elbow level when standing, and at elbow level when sitting. Reaching should be limited to arm's length, with elbows slightly bent.  Bending  Bend at hips and knees, not back. Keep feet shoulder-width apart.    Posture - Standing   Good posture is important. Avoid slouching and forward head thrust. Maintain curve in low back and align ears over shoul- ders, hips over ankles.  Alternating Positions   Alternate tasks and change positions frequently to reduce fatigue and muscle tension. Take rest breaks. Computer Work   Position work to Programmer, multimedia. Use proper work and seat  height. Keep shoulders back and down, wrists straight, and elbows at right angles. Use chair that provides full back support. Add footrest and lumbar roll as needed.  Getting Into / Out of Car  Lower self onto seat, scoot back, then bring in one leg at a time. Reverse sequence to get out.  Dressing  Lie on back to pull socks or slacks over feet, or sit and bend leg while keeping back straight.    Housework - Sink  Place one foot on ledge of cabinet under sink when standing at sink for prolonged periods.   Pushing / Pulling  Pushing is preferable to pulling. Keep back in proper alignment, and use leg muscles to do the work.  Deep Squat   Squat and  lift with both arms held against upper trunk. Tighten stomach muscles without holding breath. Use smooth movements to avoid jerking.  Avoid Twisting   Avoid twisting or bending back. Pivot around using foot movements, and bend at knees if needed when reaching for articles.  Carrying Luggage   Distribute weight evenly on both sides. Use a cart whenever possible. Do not twist trunk. Move body as a unit.   Lifting Principles .Maintain proper posture and head alignment. .Slide object as close as possible before lifting. .Move obstacles out of the way. .Test before lifting; ask for help if too heavy. .Tighten stomach muscles without holding breath. .Use smooth movements; do not jerk. .Use legs to do the work, and pivot with feet. .Distribute the work load symmetrically and close to the center of trunk. .Push instead of pull whenever possible.   Ask For Help   Ask for help and delegate to others when possible. Coordinate your movements when lifting together, and maintain the low back curve.  Log Roll   Lying on back, bend left knee and place left arm across chest. Roll all in one movement to the right. Reverse to roll to the left. Always move as one unit. Housework - Sweeping  Use long-handled equipment to avoid  stooping.   Housework - Wiping  Position yourself as close as possible to reach work surface. Avoid straining your back.  Laundry - Unloading Wash   To unload small items at bottom of washer, lift leg opposite to arm being used to reach.  [place] close to area to be raked. Use arm movements to do the work. Keep back straight and avoid twisting.     Cart  When reaching into cart with one arm, lift opposite leg to keep back straight.   Getting Into / Out of Bed  Lower self to lie down on one side by raising legs and lowering head at the same time. Use arms to assist moving without twisting. Bend both knees to roll onto back if desired. To sit up, start from lying on side, and use same move-ments in reverse. Housework - Vacuuming  Hold the vacuum with arm held at side. Step back and forth to move it, keeping head up. Avoid twisting.   Laundry - IT consultant so that bending and twisting can be avoided.   Laundry - Unloading Dryer  Squat down to reach into clothes dryer or use a reacher.  Gardening - Weeding / Probation officer or Kneel. Knee pads may be helpful.

## 2015-06-11 ENCOUNTER — Ambulatory Visit: Payer: Medicare Other | Attending: Orthopedic Surgery

## 2015-06-11 DIAGNOSIS — M2560 Stiffness of unspecified joint, not elsewhere classified: Secondary | ICD-10-CM | POA: Diagnosis present

## 2015-06-11 DIAGNOSIS — R6889 Other general symptoms and signs: Secondary | ICD-10-CM

## 2015-06-11 DIAGNOSIS — M62830 Muscle spasm of back: Secondary | ICD-10-CM | POA: Diagnosis present

## 2015-06-11 DIAGNOSIS — M546 Pain in thoracic spine: Secondary | ICD-10-CM

## 2015-06-11 DIAGNOSIS — M545 Low back pain, unspecified: Secondary | ICD-10-CM

## 2015-06-11 DIAGNOSIS — M62838 Other muscle spasm: Secondary | ICD-10-CM | POA: Insufficient documentation

## 2015-06-11 NOTE — Therapy (Signed)
[place], [place], [address] Phone: [phone]   Fax:  [phone]  Physical Therapy Treatment  Patient Details  Name: Paula Booker MRN: [id number] Date of Birth: 27-Jun-1945 Referring Provider: Phylliss Bob, MD  Encounter Date: 06/11/2015      PT End of Session - 06/11/15 1232    Visit Number 2   Number of Visits 16   Date for PT Re-Evaluation 08/03/15   PT Start Time [id number]   PT Stop Time 1240   PT Time Calculation (min) 53 min   Activity Tolerance Patient tolerated treatment well;No increased pain   Behavior During Therapy [facility] for tasks assessed/performed      Past Medical History  Diagnosis Date  . Asthma   . GERD (gastroesophageal reflux disease)   . Hypertension   . ICD (implantable cardiac defibrillator) in place Dec 2013  . Shortness of breath     "@ any time I can get SOB" (03/22/2012)  . Migraines   . Skin cancer 1999  . WPW (Wolff-Parkinson-White syndrome)   . Pacemaker   . CHF (congestive heart failure) ([place]) 6/13  . Pneumonia     hx  . Anxiety   . NICM (nonischemic cardiomyopathy) ([place])   . Obesity (BMI 30-39.9)     negative sleep study 2014  . Head injury, acute, with loss of consciousness ([place])   . Depression   . Arthritis     "hands and knees" (03/22/2012)    Past Surgical History  Procedure Laterality Date  . Cardiac defibrillator placement  03/22/2012    DDD  . Cholecystectomy  ~ 2003  . Tonsillectomy and adenoidectomy  1950  . Skin cancer excision  1999    "top of my head and my right back shoulder"  . Mohs surgery  06/2011    "forehead" (03/22/2012)  . Supraventricular tachycardia ablation  03/22/2012    unable to induce VT/RN report 03/22/2012  . Tubal ligation  1978  . Kyphoplasty N/A 11/27/2013    Procedure: KYPHOPLASTY;  Surgeon: Sinclair Ship, MD;  Location: [place];  Service: Orthopedics;  Laterality: N/A;  T9, T11 kyphoplasty  . Cardiac  catheterization  09/18/11    no significant CAD  . Supraventricular tachycardia ablation N/A 03/22/2012    Procedure: SUPRAVENTRICULAR TACHYCARDIA ABLATION;  Surgeon: Evans Lance, MD;  Location: [facility] CATH LAB;  Service: Cardiovascular;  Laterality: N/A;  . Implantable cardioverter defibrillator implant N/A 03/22/2012    Procedure: IMPLANTABLE CARDIOVERTER DEFIBRILLATOR IMPLANT;  Surgeon: Evans Lance, MD;  Location: [facility] CATH LAB;  Service: Cardiovascular;  Laterality: N/A;  . Skin cancer excision      back  . Skin cancer excision      face  . Kyphoplasty N/A 06/05/2014    Procedure: KYPHOPLASTY;  Surgeon: Sinclair Ship, MD;  Location: [place];  Service: Orthopedics;  Laterality: N/A;  T7 kyphoplasty    There were no vitals filed for this visit.  Visit Diagnosis:  Bilateral low back pain without sciatica  Bilateral thoracic back pain  Back muscle spasm  Activity intolerance  Joint stiffness of spine      Subjective Assessment - 06/11/15 1155    Subjective Pain about a 6 . I try to sit up and when I do my exercises I ten to fall asleep   Currently in Pain? Yes   Pain Score 6    Pain Location Back   Multiple Pain Sites No  [facility] Adult PT Treatment/Exercise - 06/11/15 1156    Exercises   Exercises Lumbar;Knee/Hip   Lumbar Exercises: Stretches   Single Knee to Chest Stretch 2 reps;30 seconds  RT and LT   Lower Trunk Rotation 3 reps;20 seconds  RT and LT   Pelvic Tilt Limitations 8 reps 5-8 sec   Lumbar Exercises: Supine   Clam --  RT and LT with ab set.    Bent Knee Raise 10 reps   Bent Knee Raise Limitations RT and LT  with ab set   Knee/Hip Exercises: Aerobic   Nustep L4  UE and LE 5 min   She repported knee pain due to OA and we may not do this due   Modalities   Modalities Moist Heat   Moist Heat Therapy   Number Minutes Moist Heat 12 Minutes   Moist Heat Location Lumbar Spine                  PT Short  Term Goals - 06/08/15 1514    PT SHORT TERM GOAL #1   Title She will be independent with initial HEP   Time 4   Period Weeks   Status New   PT SHORT TERM GOAL #2   Title She will report pain eased 25% or more    Time 4   Period Weeks   Status New   PT SHORT TERM GOAL #3   Title She will report improved tolerance to walking in [employer]   Time 4   Period Weeks   Status New           PT Long Term Goals - 06/08/15 1514    PT LONG TERM GOAL #1   Title She will be independent with all HEP issued as of last visit   Time 8   Period Weeks   Status New   PT LONG TERM GOAL #2   Title She will report with daily home tasks pain eased 50%     Time 8   Period Weeks   Status New   PT LONG TERM GOAL #3   Title She will report being able to walk to shop in [place] without increased pain   Time 8   Period Weeks   Status New   PT LONG TERM GOAL #4   Title She will report being able to sit for 60 min without incr pain.   Time 8   Period Weeks   Status New   PT LONG TERM GOAL #5   Title She will report able to do dishes and cut fabric with minmal to no increase in pain   Time 8   Period Weeks   Status New               Plan - 06/11/15 1233    Clinical Impression Statement [name] reported knee pain from Nustep so we sill hold on this for now. She did well after instruction of exercises. Next visit will add to HEP and do some manual STW   PT Next Visit Plan MAnnual, add to HEP, MHP   Consulted and Agree with Plan of Care Patient        Problem List Patient Active Problem List   Diagnosis Date Noted  . Chronic systolic heart failure ([place]) 04/28/2015  . UTI (urinary tract infection) 03/22/2015  . Sepsis ([place]) 03/19/2015  . Acute kidney injury ([place]) 03/19/2015  . Diarrhea 03/18/2015  . CAP (community acquired pneumonia) 03/18/2015  .  Nausea vomiting and diarrhea 03/18/2015  . Compression fracture 06/05/2014  . Essential hypertension 03/27/2014  . Chest pain  01/27/2014  . Normal coronary arteries 01/27/2014  . Obesity (BMI 30-39.9)   . ICD- MDT implanted Dec 2013 03/26/2013  . NICM (nonischemic cardiomyopathy) ([place]) 02/21/2013  . GERD (gastroesophageal reflux disease) 04/23/2012  . Asthma exacerbation 04/23/2012  . Shortness of breath 04/23/2012  . Syncope 03/01/2012  . Acute on chronic systolic CHF (congestive heart failure) ([place]) 03/01/2012  . Type B WPW syndrome- RFA Dec 2013 03/01/2012    Darrel Hoover PT 06/11/2015, 12:45 PM  [facility] [address] [place], [place], [address] Phone: [phone]   Fax:  [phone]  Name: Paula Booker MRN: [id number] Date of Birth: [birth year]

## 2015-06-14 ENCOUNTER — Encounter (HOSPITAL_COMMUNITY): Payer: Self-pay | Admitting: *Deleted

## 2015-06-14 ENCOUNTER — Emergency Department (HOSPITAL_COMMUNITY)
Admission: EM | Admit: 2015-06-14 | Discharge: 2015-06-14 | Disposition: A | Discharge status: Home / Self Care | Payer: Medicare Other | Attending: Emergency Medicine | Admitting: Emergency Medicine

## 2015-06-14 DIAGNOSIS — I509 Heart failure, unspecified: Secondary | ICD-10-CM | POA: Diagnosis not present

## 2015-06-14 DIAGNOSIS — E669 Obesity, unspecified: Secondary | ICD-10-CM | POA: Insufficient documentation

## 2015-06-14 DIAGNOSIS — Z95810 Presence of automatic (implantable) cardiac defibrillator: Secondary | ICD-10-CM | POA: Insufficient documentation

## 2015-06-14 DIAGNOSIS — Z8739 Personal history of other diseases of the musculoskeletal system and connective tissue: Secondary | ICD-10-CM | POA: Diagnosis not present

## 2015-06-14 DIAGNOSIS — Y998 Other external cause status: Secondary | ICD-10-CM | POA: Insufficient documentation

## 2015-06-14 DIAGNOSIS — S0501XA Injury of conjunctiva and corneal abrasion without foreign body, right eye, initial encounter: Secondary | ICD-10-CM | POA: Insufficient documentation

## 2015-06-14 DIAGNOSIS — Z79899 Other long term (current) drug therapy: Secondary | ICD-10-CM | POA: Diagnosis not present

## 2015-06-14 DIAGNOSIS — Z8659 Personal history of other mental and behavioral disorders: Secondary | ICD-10-CM | POA: Diagnosis not present

## 2015-06-14 DIAGNOSIS — Y9389 Activity, other specified: Secondary | ICD-10-CM | POA: Diagnosis not present

## 2015-06-14 DIAGNOSIS — W5503XA Scratched by cat, initial encounter: Secondary | ICD-10-CM | POA: Diagnosis not present

## 2015-06-14 DIAGNOSIS — K219 Gastro-esophageal reflux disease without esophagitis: Secondary | ICD-10-CM | POA: Diagnosis not present

## 2015-06-14 DIAGNOSIS — J45909 Unspecified asthma, uncomplicated: Secondary | ICD-10-CM | POA: Diagnosis not present

## 2015-06-14 DIAGNOSIS — S0591XA Unspecified injury of right eye and orbit, initial encounter: Secondary | ICD-10-CM | POA: Diagnosis present

## 2015-06-14 DIAGNOSIS — Y9289 Other specified places as the place of occurrence of the external cause: Secondary | ICD-10-CM | POA: Insufficient documentation

## 2015-06-14 DIAGNOSIS — I10 Essential (primary) hypertension: Secondary | ICD-10-CM | POA: Diagnosis not present

## 2015-06-14 DIAGNOSIS — Z8701 Personal history of pneumonia (recurrent): Secondary | ICD-10-CM | POA: Insufficient documentation

## 2015-06-14 DIAGNOSIS — Z880 Allergy status to penicillin: Secondary | ICD-10-CM | POA: Insufficient documentation

## 2015-06-14 DIAGNOSIS — I456 Pre-excitation syndrome: Secondary | ICD-10-CM | POA: Insufficient documentation

## 2015-06-14 DIAGNOSIS — Z85828 Personal history of other malignant neoplasm of skin: Secondary | ICD-10-CM | POA: Insufficient documentation

## 2015-06-14 MED ORDER — FLUORESCEIN SODIUM 1 MG OP STRP
1.0000 | ORAL_STRIP | Freq: Once | OPHTHALMIC | Status: AC
  Administered 2015-06-14: 1 via OPHTHALMIC
  Filled 2015-06-14: qty 1

## 2015-06-14 MED ORDER — TETRACAINE HCL 0.5 % OP SOLN
1.0000 [drp] | Freq: Once | OPHTHALMIC | Status: AC
  Administered 2015-06-14: 1 [drp] via OPHTHALMIC
  Filled 2015-06-14: qty 2

## 2015-06-14 MED ORDER — OFLOXACIN 0.3 % OP SOLN: 2.0000 [drp] | Freq: Four times a day (QID) | OPHTHALMIC | Status: DC

## 2015-06-14 NOTE — ED Notes (Signed)
Pt reports her cat scratched The RT eye this am. Pt reports pain to RT eye ,light sensitivity to RT eye and eye drainage to Rt eye.

## 2015-06-14 NOTE — Discharge Instructions (Signed)
Call the ophthalmologist tomorrow morning, set up an appointment as soon as possible.

## 2015-06-14 NOTE — ED Provider Notes (Signed)
CSN: [id number]     Arrival date & time 06/14/15  0919 History  By signing my name below, I, Julien Nordmann, attest that this documentation has been prepared under the direction and in the presence of [employer], PA-C. Electronically Signed: Julien Nordmann, ED Scribe. 06/14/2015. 9:46 AM.    Chief Complaint  Patient presents with  . Eye Injury      The history is provided by the patient. No language interpreter was used.   HPI Comments: Paula Booker is a 70 y.o. female who has a hx of asthma, GERD, HTN, CHF, presents to the Emergency Department complaining of a constant, gradual worsening, right eye injury onset this morning. Pt states that her cat scratched her right eye this morning. She endorses increased pain, light sensitivity and drainage to her right eye. Denies any other injury. Pt does not wear contacts.  Past Medical History  Diagnosis Date  . Asthma   . GERD (gastroesophageal reflux disease)   . Hypertension   . ICD (implantable cardiac defibrillator) in place Dec 2013  . Shortness of breath     "@ any time I can get SOB" (03/22/2012)  . Migraines   . Skin cancer 1999  . WPW (Wolff-Parkinson-White syndrome)   . Pacemaker   . CHF (congestive heart failure) ([place]) 6/13  . Pneumonia     hx  . Anxiety   . NICM (nonischemic cardiomyopathy) ([place])   . Obesity (BMI 30-39.9)     negative sleep study 2014  . Head injury, acute, with loss of consciousness ([place])   . Depression   . Arthritis     "hands and knees" (03/22/2012)   Past Surgical History  Procedure Laterality Date  . Cardiac defibrillator placement  03/22/2012    DDD  . Cholecystectomy  ~ 2003  . Tonsillectomy and adenoidectomy  1950  . Skin cancer excision  1999    "top of my head and my right back shoulder"  . Mohs surgery  06/2011    "forehead" (03/22/2012)  . Supraventricular tachycardia ablation  03/22/2012    unable to induce VT/RN report 03/22/2012  . Tubal ligation  1978  . Kyphoplasty N/A  11/27/2013    Procedure: KYPHOPLASTY;  Surgeon: Sinclair Ship, MD;  Location: [place];  Service: Orthopedics;  Laterality: N/A;  T9, T11 kyphoplasty  . Cardiac catheterization  09/18/11    no significant CAD  . Supraventricular tachycardia ablation N/A 03/22/2012    Procedure: SUPRAVENTRICULAR TACHYCARDIA ABLATION;  Surgeon: Evans Lance, MD;  Location: [facility] CATH LAB;  Service: Cardiovascular;  Laterality: N/A;  . Implantable cardioverter defibrillator implant N/A 03/22/2012    Procedure: IMPLANTABLE CARDIOVERTER DEFIBRILLATOR IMPLANT;  Surgeon: Evans Lance, MD;  Location: [facility] CATH LAB;  Service: Cardiovascular;  Laterality: N/A;  . Skin cancer excision      back  . Skin cancer excision      face  . Kyphoplasty N/A 06/05/2014    Procedure: KYPHOPLASTY;  Surgeon: Sinclair Ship, MD;  Location: [place];  Service: Orthopedics;  Laterality: N/A;  T7 kyphoplasty   Family History  Problem Relation Age of Onset  . Tuberculosis Paternal Grandfather   . Congestive Heart Failure Brother   . Atrial fibrillation Brother   . Congestive Heart Failure Mother     died 3  . Kidney Stones Mother   . Deafness Mother   . Atrial fibrillation Mother   . Congestive Heart Failure Father   . Deafness Father   . Healthy Sister   .  Healthy Sister   . Healthy Sister    Social History  Substance Use Topics  . Smoking status: Never Smoker   . Smokeless tobacco: Never Used  . Alcohol Use: No   OB History    No data available     Review of Systems  A complete 10 system review of systems was obtained and all systems are negative except as noted in the HPI and PMH.    Allergies  Ivp dye; Penicillins; Sulfonamide derivatives; Tessalon; and Onion  Home Medications   Prior to Admission medications   Medication Sig Start Date End Date Taking? Authorizing Provider  albuterol (PROVENTIL HFA;VENTOLIN HFA) 108 (90 BASE) MCG/ACT inhaler Inhale 2 puffs into the lungs every 6 (six) hours as needed.  For wheezing    Historical Provider, MD  alendronate (FOSAMAX) 70 MG tablet Take 70 mg by mouth once a week. Take with a full glass of water on an empty stomach.    Historical Provider, MD  carvedilol (COREG) 12.5 MG tablet Take 1 tablet (12.5 mg total) by mouth 2 (two) times daily with a meal. 04/28/15   Evans Lance, MD  cetirizine (ZYRTEC) 10 MG tablet Take 10 mg by mouth daily.    Historical Provider, MD  Cholecalciferol (VITAMIN D3) 1000 UNITS CAPS Take 1,000 Units by mouth daily.     Historical Provider, MD  fluticasone (FLONASE) 50 MCG/ACT nasal spray Place 1 spray into both nostrils daily as needed for allergies.  01/22/14   Historical Provider, MD  furosemide (LASIX) 40 MG tablet TAKE ONE TABLET BY MOUTH ONCE DAILY 10/24/14   Jettie Booze, MD  loperamide (IMODIUM) 2 MG capsule Take 1 capsule (2 mg total) by mouth as needed for diarrhea or loose stools (Do not exceed 8 times per day). 03/24/15   Verlee Monte, MD  omeprazole (PRILOSEC) 40 MG capsule Take 40 mg by mouth daily.    Historical Provider, MD  spironolactone (ALDACTONE) 25 MG tablet TAKE ONE TABLET BY MOUTH ONCE DAILY 04/15/15   Jettie Booze, MD   Triage vitals: BP 127/63 mmHg  Pulse 75  Temp(Src) 97.9 F (36.6 C) (Oral)  Resp 16  SpO2 97% Physical Exam  Constitutional: She is oriented to person, place, and time. She appears well-developed and well-nourished.  HENT:  Head: Normocephalic.  Eyes: EOM are normal. Pupils are equal, round, and reactive to light. Lids are everted and swept, no foreign bodies found.    Larger area at 4:00 position, corneal abrasion across the iris and pupil in the lower region.  Neck: Normal range of motion.  Pulmonary/Chest: Effort normal.  Abdominal: She exhibits no distension.  Musculoskeletal: Normal range of motion.  Neurological: She is alert and oriented to person, place, and time.  Psychiatric: She has a normal mood and affect.  Nursing note and vitals reviewed.   ED  Course  Procedures  DIAGNOSTIC STUDIES: Oxygen Saturation is 97% on RA, normal by my interpretation.  COORDINATION OF CARE:  9:42 AM Discussed treatment plan which includes eye drops and follow up with ophthamologist with pt at bedside and pt agreed to plan.  Labs Review Labs Reviewed - No data to display  Imaging Review No results found. I have personally reviewed and evaluated these images and lab results as part of my medical decision-making.  Patient is referred to ophthalmology.  Told to return here as needed.  Patient is advised to plan and all questions were answered.  She is treated with antibiotic eyedrops  Dalia Heading, PA-C 06/14/15 1455  Quintella Reichert, MD 06/15/15 [phone]

## 2015-06-14 NOTE — ED Notes (Signed)
Declined W/C at D/C and was escorted to lobby by RN. 

## 2015-06-23 ENCOUNTER — Ambulatory Visit: Payer: Medicare Other | Admitting: Physical Therapy

## 2015-06-25 ENCOUNTER — Ambulatory Visit: Payer: Medicare Other | Admitting: Physical Therapy

## 2015-06-25 DIAGNOSIS — M546 Pain in thoracic spine: Secondary | ICD-10-CM

## 2015-06-25 DIAGNOSIS — M545 Low back pain, unspecified: Secondary | ICD-10-CM

## 2015-06-25 DIAGNOSIS — M62838 Other muscle spasm: Secondary | ICD-10-CM

## 2015-06-25 DIAGNOSIS — M2560 Stiffness of unspecified joint, not elsewhere classified: Secondary | ICD-10-CM

## 2015-06-25 DIAGNOSIS — R6889 Other general symptoms and signs: Secondary | ICD-10-CM

## 2015-06-25 DIAGNOSIS — M62830 Muscle spasm of back: Secondary | ICD-10-CM

## 2015-06-25 NOTE — Therapy (Signed)
[place], [place], [address] Phone: [phone]   Fax:  [phone]  Physical Therapy Treatment  Patient Details  Name: Paula Booker MRN: [id number] Date of Birth: [birth year] Referring Provider: Phylliss Bob, MD  Encounter Date: 06/25/2015      PT End of Session - 06/25/15 1125    Visit Number 3   Number of Visits 16   Date for PT Re-Evaluation 08/03/15   Authorization Type Medicare     Authorization Time Period KX  at 15 visit   PT Start Time 1103   PT Stop Time 1200   PT Time Calculation (min) 57 min   Activity Tolerance Patient tolerated treatment well   Behavior During Therapy [facility] for tasks assessed/performed      Past Medical History  Diagnosis Date  . Asthma   . GERD (gastroesophageal reflux disease)   . Hypertension   . ICD (implantable cardiac defibrillator) in place Dec 2013  . Shortness of breath     "@ any time I can get SOB" (03/22/2012)  . Migraines   . Skin cancer 1999  . WPW (Wolff-Parkinson-White syndrome)   . Pacemaker   . CHF (congestive heart failure) ([place]) 6/13  . Pneumonia     hx  . Anxiety   . NICM (nonischemic cardiomyopathy) ([place])   . Obesity (BMI 30-39.9)     negative sleep study 2014  . Head injury, acute, with loss of consciousness ([place])   . Depression   . Arthritis     "hands and knees" (03/22/2012)    Past Surgical History  Procedure Laterality Date  . Cardiac defibrillator placement  03/22/2012    DDD  . Cholecystectomy  ~ 2003  . Tonsillectomy and adenoidectomy  1950  . Skin cancer excision  1999    "top of my head and my right back shoulder"  . Mohs surgery  06/2011    "forehead" (03/22/2012)  . Supraventricular tachycardia ablation  03/22/2012    unable to induce VT/RN report 03/22/2012  . Tubal ligation  1978  . Kyphoplasty N/A 11/27/2013    Procedure: KYPHOPLASTY;  Surgeon: Sinclair Ship, MD;  Location: [place];  Service: Orthopedics;   Laterality: N/A;  T9, T11 kyphoplasty  . Cardiac catheterization  09/18/11    no significant CAD  . Supraventricular tachycardia ablation N/A 03/22/2012    Procedure: SUPRAVENTRICULAR TACHYCARDIA ABLATION;  Surgeon: Evans Lance, MD;  Location: [facility] CATH LAB;  Service: Cardiovascular;  Laterality: N/A;  . Implantable cardioverter defibrillator implant N/A 03/22/2012    Procedure: IMPLANTABLE CARDIOVERTER DEFIBRILLATOR IMPLANT;  Surgeon: Evans Lance, MD;  Location: [facility] CATH LAB;  Service: Cardiovascular;  Laterality: N/A;  . Skin cancer excision      back  . Skin cancer excision      face  . Kyphoplasty N/A 06/05/2014    Procedure: KYPHOPLASTY;  Surgeon: Sinclair Ship, MD;  Location: [place];  Service: Orthopedics;  Laterality: N/A;  T7 kyphoplasty    There were no vitals filed for this visit.  Visit Diagnosis:  Bilateral low back pain without sciatica  Bilateral thoracic back pain  Back muscle spasm  Activity intolerance  Joint stiffness of spine  Muscle spasm      Subjective Assessment - 06/25/15 1109    Subjective The cat scratched my right eye and I went to the doctor.  But my back hurts   Limitations Lifting;House hold activities   Patient Stated Goals To get where I  can work out pain. Feel better. Walk on tread mill for exercise    Currently in Pain? Yes   Pain Score 9   at rest 4/10   Pain Location Back   Pain Orientation Right   Pain Descriptors / Indicators Aching;Sharp   Pain Type Chronic pain   Pain Onset More than a month ago   Pain Frequency Constant   Aggravating Factors  standing, bending, doing dishes and home tasks            [facility] PT Assessment - 06/25/15 1118    Palpation   Palpation comment Pt with marked tenderness over Right Quadratus lumborum and gluteal                     [facility] Adult PT Treatment/Exercise - 06/25/15 1118    Posture/Postural Control   Posture Comments right elevated iliac crest    Lumbar Exercises:  Stretches   Single Knee to Chest Stretch 2 reps;30 seconds  RT and LT   Lower Trunk Rotation 3 reps;20 seconds  RT and LT   Pelvic Tilt Limitations 8 reps 5-8 sec   Lumbar Exercises: Supine   Bent Knee Raise 10 reps   Bent Knee Raise Limitations RT and LT  with ab set   Knee/Hip Exercises: Sidelying   Other Sidelying Knee/Hip Exercises Quadratus Lumborum stretch in left sidelying for right QL   Modalities   Modalities Moist Heat   Moist Heat Therapy   Number Minutes Moist Heat 15 Minutes   Moist Heat Location Lumbar Spine   Manual Therapy   Manual Therapy Soft tissue mobilization;Myofascial release   Soft tissue mobilization  and right quadratus lumborum   Myofascial Release Right Quadratus lumborum          Trigger Point Dry Needling - 06/25/15 1114    Consent Given? Yes   Education Handout Provided Yes   Muscles Treated Lower Body Gluteus minimus;Gluteus maximus;Piriformis  Right side quadratus Lumborum   Gluteus Maximus Response Twitch response elicited;Palpable increased muscle length   Gluteus Minimus Response Twitch response elicited;Palpable increased muscle length   Piriformis Response --  Quadratus lumborum with marked twitch response on right              PT Education - 06/25/15 1116    Education provided Yes   Education Details trigger point dry needling precautians and aftercare, quadratus stretch on right side in left sidelying   Person(s) Educated Patient   Methods Explanation;Demonstration;Handout;Verbal cues   Comprehension Verbalized understanding;Returned demonstration          PT Short Term Goals - 06/25/15 1421    PT SHORT TERM GOAL #1   Title She will be independent with initial HEP   Time 4   Period Weeks   Status On-going   PT SHORT TERM GOAL #2   Title She will report pain eased 25% or more    Time 4   Period Weeks   Status On-going   PT SHORT TERM GOAL #3   Title She will report improved tolerance to walking in [employer]   Time  4   Period Weeks   Status On-going           PT Long Term Goals - 06/08/15 1514    PT LONG TERM GOAL #1   Title She will be independent with all HEP issued as of last visit   Time 8   Period Weeks   Status New   PT LONG TERM  GOAL #2   Title She will report with daily home tasks pain eased 50%     Time 8   Period Weeks   Status New   PT LONG TERM GOAL #3   Title She will report being able to walk to shop in [place] without increased pain   Time 8   Period Weeks   Status New   PT LONG TERM GOAL #4   Title She will report being able to sit for 60 min without incr pain.   Time 8   Period Weeks   Status New   PT LONG TERM GOAL #5   Title She will report able to do dishes and cut fabric with minmal to no increase in pain   Time 8   Period Weeks   Status New               Plan - 06/25/15 1132    Clinical Impression Statement Pt given HEP for quadratus lumborum stretch in left sidelying and explanation of anatomy for quadratus.  Pt also verbalized understaning of trigger point dry needling. Pt consented to TDN and was closley moinitored throughout session.  Pt  had relief of intensity of trigger point  pain after session.  Pt will  be assessed for benefit next week and goals assessed   Pt will benefit from skilled therapeutic intervention in order to improve on the following deficits Pain;Postural dysfunction;Increased muscle spasms;Decreased activity tolerance;Decreased range of motion   Rehab Potential Good   PT Frequency 2x / week   PT Duration 8 weeks   PT Treatment/Interventions Cryotherapy;Iontophoresis 4mg /ml Dexamethasone;Moist Heat;Ultrasound;Therapeutic exercise;Patient/family education;Taping;Manual techniques;Dry needling;Passive range of motion   PT Next Visit Plan Assess dry needling and goals, add to HEP   PT Home Exercise Plan transversus abdominis and left sidelying quadratusl lumborum stretch and trigger point aftercare   Consulted and Agree with Plan  of Care Patient        Problem List Patient Active Problem List   Diagnosis Date Noted  . Chronic systolic heart failure ([place]) 04/28/2015  . UTI (urinary tract infection) 03/22/2015  . Sepsis ([place]) 03/19/2015  . Acute kidney injury ([place]) 03/19/2015  . Diarrhea 03/18/2015  . CAP (community acquired pneumonia) 03/18/2015  . Nausea vomiting and diarrhea 03/18/2015  . Compression fracture 06/05/2014  . Essential hypertension 03/27/2014  . Chest pain 01/27/2014  . Normal coronary arteries 01/27/2014  . Obesity (BMI 30-39.9)   . ICD- MDT implanted Dec 2013 03/26/2013  . NICM (nonischemic cardiomyopathy) ([place]) 02/21/2013  . GERD (gastroesophageal reflux disease) 04/23/2012  . Asthma exacerbation 04/23/2012  . Shortness of breath 04/23/2012  . Syncope 03/01/2012  . Acute on chronic systolic CHF (congestive heart failure) ([place]) 03/01/2012  . Type B WPW syndrome- RFA Dec 2013 03/01/2012    [name], PT 06/25/2015 2:23 PM Phone: [phone] Fax: [facility] [place], [place], [address] Phone: [phone]   Fax:  [phone]  Name: Paula Booker MRN: [id number] Date of Birth: 1945/08/19

## 2015-06-25 NOTE — Patient Instructions (Signed)
Trigger Point Dry Needling  . What is Trigger Point Dry Needling (DN)? o DN is a physical therapy technique used to treat muscle pain and dysfunction. Specifically, DN helps deactivate muscle trigger points (muscle knots).  o A thin filiform needle is used to penetrate the skin and stimulate the underlying trigger point. The goal is for a local twitch response (LTR) to occur and for the trigger point to relax. No medication of any kind is injected during the procedure.   . What Does Trigger Point Dry Needling Feel Like?  o The procedure feels different for each individual patient. Some patients report that they do not actually feel the needle enter the skin and overall the process is not painful. Very mild bleeding may occur. However, many patients feel a deep cramping in the muscle in which the needle was inserted. This is the local twitch response.   Marland Kitchen How Will I feel after the treatment? o Soreness is normal, and the onset of soreness may not occur for a few hours. Typically this soreness does not last longer than two days.  o Bruising is uncommon, however; ice can be used to decrease any possible bruising.  o In rare cases feeling tired or nauseous after the treatment is normal. In addition, your symptoms may get worse before they get better, this period will typically not last longer than 24 hours.   . What Can I do After My Treatment? o Increase your hydration by drinking more water for the next 24 hours. o You may place ice or heat on the areas treated that have become sore, however, do not use heat on inflamed or bruised areas. Heat often brings more relief post needling. o You can continue your regular activities, but vigorous activity is not recommended initially after the treatment for 24 hours. o DN is best combined with other physical therapy such as strengthening, stretching, and other therapies.   [name], PT 06/25/2015 11:16 AM Phone: [phone] Fax: [phone]

## 2015-06-29 ENCOUNTER — Ambulatory Visit: Payer: Medicare Other | Admitting: Physical Therapy

## 2015-06-30 ENCOUNTER — Telehealth: Payer: Self-pay | Admitting: Physical Therapy

## 2015-06-30 ENCOUNTER — Ambulatory Visit: Payer: Medicare Other | Admitting: Physical Therapy

## 2015-06-30 NOTE — Telephone Encounter (Signed)
Paula Booker did not show at appt today at 8:45 AM.  Pt was called and left message to contact Outpatient Rehabilitation to schedule more appt at [phone] or to call and let PT know if she is choosing not to continue PT.  Pt has had 3 cancellations so far during treatment time.  [name], PT 06/30/2015 9:02 AM Phone: [phone] Fax: [phone]

## 2015-07-06 ENCOUNTER — Ambulatory Visit: Payer: Medicare Other | Admitting: Physical Therapy

## 2015-07-06 DIAGNOSIS — M2560 Stiffness of unspecified joint, not elsewhere classified: Secondary | ICD-10-CM

## 2015-07-06 DIAGNOSIS — M545 Low back pain, unspecified: Secondary | ICD-10-CM

## 2015-07-06 DIAGNOSIS — M546 Pain in thoracic spine: Secondary | ICD-10-CM

## 2015-07-06 DIAGNOSIS — M62838 Other muscle spasm: Secondary | ICD-10-CM

## 2015-07-06 DIAGNOSIS — R6889 Other general symptoms and signs: Secondary | ICD-10-CM

## 2015-07-06 DIAGNOSIS — M62830 Muscle spasm of back: Secondary | ICD-10-CM

## 2015-07-06 NOTE — Therapy (Signed)
[place], [place], [address] Phone: [phone]   Fax:  [phone]  Physical Therapy Treatment  Patient Details  Name: Paula Booker MRN: [id number] Date of Birth: Jan 11, 1946 Referring Provider: Phylliss Bob, MD  Encounter Date: 07/06/2015      PT End of Session - 07/06/15 1030    Visit Number 4   Number of Visits 16   Date for PT Re-Evaluation 08/03/15   PT Start Time 0930   PT Stop Time 1028   PT Time Calculation (min) 58 min   Activity Tolerance Patient tolerated treatment well   Behavior During Therapy [facility] for tasks assessed/performed      Past Medical History  Diagnosis Date  . Asthma   . GERD (gastroesophageal reflux disease)   . Hypertension   . ICD (implantable cardiac defibrillator) in place Dec 2013  . Shortness of breath     "@ any time I can get SOB" (03/22/2012)  . Migraines   . Skin cancer 1999  . WPW (Wolff-Parkinson-White syndrome)   . Pacemaker   . CHF (congestive heart failure) ([place]) 6/13  . Pneumonia     hx  . Anxiety   . NICM (nonischemic cardiomyopathy) ([place])   . Obesity (BMI 30-39.9)     negative sleep study 2014  . Head injury, acute, with loss of consciousness ([place])   . Depression   . Arthritis     "hands and knees" (03/22/2012)    Past Surgical History  Procedure Laterality Date  . Cardiac defibrillator placement  03/22/2012    DDD  . Cholecystectomy  ~ 2003  . Tonsillectomy and adenoidectomy  1950  . Skin cancer excision  1999    "top of my head and my right back shoulder"  . Mohs surgery  06/2011    "forehead" (03/22/2012)  . Supraventricular tachycardia ablation  03/22/2012    unable to induce VT/RN report 03/22/2012  . Tubal ligation  1978  . Kyphoplasty N/A 11/27/2013    Procedure: KYPHOPLASTY;  Surgeon: Sinclair Ship, MD;  Location: [place];  Service: Orthopedics;  Laterality: N/A;  T9, T11 kyphoplasty  . Cardiac catheterization  09/18/11     no significant CAD  . Supraventricular tachycardia ablation N/A 03/22/2012    Procedure: SUPRAVENTRICULAR TACHYCARDIA ABLATION;  Surgeon: Evans Lance, MD;  Location[facility] C[facility] LAB;  Service: Cardiovascular;  Laterality: N/A;  . Implantable cardioverter defibrillator implant N/A 03/22/2012    Procedure: IMPLANTABLE CARDIOVERTER DEFIBRILLATOR IMPLANT;  Surgeon: Evans Lance, MD;  Location: [facility] CATH LAB;  Service: Cardiovascular;  Laterality: N/A;  . Skin cancer excision      back  . Skin cancer excision      face  . Kyphoplasty N/A 06/05/2014    Procedure: KYPHOPLASTY;  Surgeon: Sinclair Ship, MD;  Location: [place];  Service: Orthopedics;  Laterality: N/A;  T7 kyphoplasty    There were no vitals filed for this visit.  Visit Diagnosis:  Bilateral low back pain without sciatica  Bilateral thoracic back pain  Back muscle spasm  Activity intolerance  Joint stiffness of spine  Muscle spasm      Subjective Assessment - 07/06/15 0937    Subjective "I am hurting some today, after the needling last time I felt great up until the last couple of days"    Pain Score 6    Pain Location Back   Pain Orientation Right   Pain Descriptors / Indicators Sharp;Tightness   Pain Type Chronic  pain   Pain Onset More than a month ago   Pain Frequency Constant   Aggravating Factors  lifting, prolonged standing,    Pain Relieving Factors dry needling, muscle relaxer                         [facility] Adult PT Treatment/Exercise - 07/06/15 0001    Lumbar Exercises: Stretches   Lower Trunk Rotation 3 reps;20 seconds   Pelvic Tilt Limitations 8 reps 5-8 sec   Piriformis Stretch 2 reps;30 seconds;Other (comment)  quadratus lomborum stretch   Knee/Hip Exercises: Sidelying   Other Sidelying Knee/Hip Exercises Quadratus Lumborum stretch in left sidelying for right QL   Moist Heat Therapy   Number Minutes Moist Heat 10 Minutes   Moist Heat Location Lumbar Spine  in L sidelying  with towel beneath side for QL stretch   Manual Therapy   Soft tissue mobilization IASTM of R lumbar spine and the Quadratus lomborum   Myofascial Release Right Quadratus lumborum and R lumbar region rolling and stretching          Trigger Point Dry Needling - 07/06/15 0945    Consent Given? Yes   Education Handout Provided No  given previously   Muscles Treated Upper Body Quadratus Lumborum;Longissimus   Longissimus Response Twitch response elicited;Palpable increased muscle length  quadratus lomborum and R lumbar mulftifuds at L4/L5              PT Education - 07/06/15 1150    Education provided Yes   Education Details HEP review   Person(s) Educated Patient   Methods Explanation          PT Short Term Goals - 07/06/15 1148    PT SHORT TERM GOAL #1   Title She will be independent with initial HEP   Time 4   Period Weeks   Status On-going   PT SHORT TERM GOAL #2   Title She will report pain eased 25% or more    Time 4   Period Weeks   Status On-going   PT SHORT TERM GOAL #3   Title She will report improved tolerance to walking in [employer]   Time 4   Period Weeks   Status On-going           PT Long Term Goals - 07/06/15 1149    PT LONG TERM GOAL #1   Title She will be independent with all HEP issued as of last visit   Time 8   Period Weeks   Status On-going   PT LONG TERM GOAL #2   Title She will report with daily home tasks pain eased 50%     Time 8   Period Weeks   Status On-going   PT LONG TERM GOAL #3   Title She will report being able to walk to shop in [place] without increased pain   Time 8   Period Weeks   Status On-going   PT LONG TERM GOAL #4   Title She will report being able to sit for 60 min without incr pain.   Time 8   Period Weeks   Status On-going   PT LONG TERM GOAL #5   Title She will report able to do dishes and cut fabric with minmal to no increase in pain   Time 8   Period Weeks   Status On-going                Plan -  07/06/15 1146    Clinical Impression Statement Paula Booker reported significant relief with the DN last session. mulitple twtiches palpable with the DN which she reported relief in the R QL and R multifuds following DN and IASTM and myofascial release. She reported decreased tension with heating while in L sidelying with rolled up towels beneath L side to facilitate stretching of the QL.    PT Next Visit Plan Assess dry needling and goals, stretching, core strengthing,    Consulted and Agree with Plan of Care Patient        Problem List Patient Active Problem List   Diagnosis Date Noted  . Chronic systolic heart failure ([place]) 04/28/2015  . UTI (urinary tract infection) 03/22/2015  . Sepsis ([place]) 03/19/2015  . Acute kidney injury ([place]) 03/19/2015  . Diarrhea 03/18/2015  . CAP (community acquired pneumonia) 03/18/2015  . Nausea vomiting and diarrhea 03/18/2015  . Compression fracture 06/05/2014  . Essential hypertension 03/27/2014  . Chest pain 01/27/2014  . Normal coronary arteries 01/27/2014  . Obesity (BMI 30-39.9)   . ICD- MDT implanted Dec 2013 03/26/2013  . NICM (nonischemic cardiomyopathy) ([place]) 02/21/2013  . GERD (gastroesophageal reflux disease) 04/23/2012  . Asthma exacerbation 04/23/2012  . Shortness of breath 04/23/2012  . Syncope 03/01/2012  . Acute on chronic systolic CHF (congestive heart failure) ([place]) 03/01/2012  . Type B WPW syndrome- RFA Dec 2013 03/01/2012   Starr Lake PT, DPT, LAT, ATC  07/06/2015  11:51 AM     [facility] [address] [place], [place], [address] Phone: [phone]   Fax:  [phone]  Name: Paula Booker MRN: [id number] Date of Birth: [birth year]

## 2015-07-14 ENCOUNTER — Ambulatory Visit: Payer: Medicare Other | Attending: Orthopedic Surgery | Admitting: Physical Therapy

## 2015-07-14 DIAGNOSIS — M546 Pain in thoracic spine: Secondary | ICD-10-CM | POA: Diagnosis present

## 2015-07-14 DIAGNOSIS — R252 Cramp and spasm: Secondary | ICD-10-CM | POA: Insufficient documentation

## 2015-07-14 DIAGNOSIS — M62830 Muscle spasm of back: Secondary | ICD-10-CM | POA: Insufficient documentation

## 2015-07-14 DIAGNOSIS — M25552 Pain in left hip: Secondary | ICD-10-CM | POA: Insufficient documentation

## 2015-07-14 DIAGNOSIS — M5441 Lumbago with sciatica, right side: Secondary | ICD-10-CM | POA: Diagnosis present

## 2015-07-14 DIAGNOSIS — M545 Low back pain, unspecified: Secondary | ICD-10-CM

## 2015-07-14 DIAGNOSIS — M25551 Pain in right hip: Secondary | ICD-10-CM | POA: Diagnosis present

## 2015-07-14 NOTE — Therapy (Signed)
[place], [place], [address] Phone: [phone]   Fax:  [phone]  Physical Therapy Treatment  Patient Details  Name: Paula Booker MRN: [id number] Date of Birth: 13-Nov-1945 Referring Provider: Phylliss Bob, MD  Encounter Date: 07/14/2015      PT End of Session - 07/14/15 1240    Visit Number 5   Number of Visits 16   Date for PT Re-Evaluation 08/03/15   Authorization Type Medicare     PT Start Time 1146   PT Stop Time 1238   PT Time Calculation (min) 52 min   Activity Tolerance Patient tolerated treatment well   Behavior During Therapy [facility] for tasks assessed/performed      Past Medical History  Diagnosis Date  . Asthma   . GERD (gastroesophageal reflux disease)   . Hypertension   . ICD (implantable cardiac defibrillator) in place Dec 2013  . Shortness of breath     "@ any time I can get SOB" (03/22/2012)  . Migraines   . Skin cancer 1999  . WPW (Wolff-Parkinson-White syndrome)   . Pacemaker   . CHF (congestive heart failure) ([place]) 6/13  . Pneumonia     hx  . Anxiety   . NICM (nonischemic cardiomyopathy) ([place])   . Obesity (BMI 30-39.9)     negative sleep study 2014  . Head injury, acute, with loss of consciousness ([place])   . Depression   . Arthritis     "hands and knees" (03/22/2012)    Past Surgical History  Procedure Laterality Date  . Cardiac defibrillator placement  03/22/2012    DDD  . Cholecystectomy  ~ 2003  . Tonsillectomy and adenoidectomy  1950  . Skin cancer excision  1999    "top of my head and my right back shoulder"  . Mohs surgery  06/2011    "forehead" (03/22/2012)  . Supraventricular tachycardia ablation  03/22/2012    unable to induce VT/RN report 03/22/2012  . Tubal ligation  1978  . Kyphoplasty N/A 11/27/2013    Procedure: KYPHOPLASTY;  Surgeon: Sinclair Ship, MD;  Location: [place];  Service: Orthopedics;  Laterality: N/A;  T9, T11 kyphoplasty  .  Cardiac catheterization  09/18/11    no significant CAD  . Supraventricular tachycardia ablation N/A 03/22/2012    Procedure: SUPRAVENTRICULAR TACHYCARDIA ABLATION;  Surgeon: Evans Lance, MD;  Location: [facility] CATH LAB;  Service: Cardiovascular;  Laterality: N/A;  . Implantable cardioverter defibrillator implant N/A 03/22/2012    Procedure: IMPLANTABLE CARDIOVERTER DEFIBRILLATOR IMPLANT;  Surgeon: Evans Lance, MD;  Location: [facility] CATH LAB;  Service: Cardiovascular;  Laterality: N/A;  . Skin cancer excision      back  . Skin cancer excision      face  . Kyphoplasty N/A 06/05/2014    Procedure: KYPHOPLASTY;  Surgeon: Sinclair Ship, MD;  Location: [facility];  Service: Orthopedics;  Laterality: N/A;  T7 kyphoplasty    There were no vitals filed for this visit.  Visit Diagnosis:  Bilateral low back pain without sciatica  Pain in thoracic spine  Cramp and spasm      Subjective Assessment - 07/14/15 1148    Subjective "I got injections in my back on 07/09/2015 for the low back"    Currently in Pain? Yes   Pain Score 4    Pain Location Back   Pain Orientation Right   Pain Descriptors / Indicators Dull   Pain Type Chronic pain   Pain Onset More  than a month ago   Pain Frequency Constant   Aggravating Factors  prolonged standing, sitting   Pain Relieving Factors laying down on the pillow for stretching on the on the L for R side stretch, dry needling,                          [facility] Adult PT Treatment/Exercise - 07/14/15 1156    Self-Care   Self-Care Posture   Posture education regarding proper posture, and lifting mechanics    Lumbar Exercises: Aerobic   Stationary Bike Nu-step L   Knee/Hip Exercises: Aerobic   Nustep L4  UE and LE 5 min    Knee/Hip Exercises: Sidelying   Other Sidelying Knee/Hip Exercises Quadratus Lumborum stretch in left sidelying for right QL   Manual Therapy   Manual Therapy Joint mobilization   Joint Mobilization grade 1-2 L1-L5 mobs    reported pain relief   Soft tissue mobilization IASTM of R lumbar spine and the Quadratus lomborum   Myofascial Release Right Quadratus lumborum and R lumbar region rolling and stretching                PT Education - 07/14/15 1240    Education provided Yes   Education Details posture education   Person(s) Educated Patient   Methods Explanation   Comprehension Verbalized understanding          PT Short Term Goals - 07/14/15 1244    PT SHORT TERM GOAL #1   Title She will be independent with initial HEP   Time 4   Period Weeks   Status Achieved   PT SHORT TERM GOAL #2   Title She will report pain eased 25% or more    Time 4   Period Weeks   Status On-going   PT SHORT TERM GOAL #3   Title She will report improved tolerance to walking in [employer]   Time 4   Period Weeks   Status On-going           PT Long Term Goals - 07/06/15 1149    PT LONG TERM GOAL #1   Title She will be independent with all HEP issued as of last visit   Time 8   Period Weeks   Status On-going   PT LONG TERM GOAL #2   Title She will report with daily home tasks pain eased 50%     Time 8   Period Weeks   Status On-going   PT LONG TERM GOAL #3   Title She will report being able to walk to shop in [place] without increased pain   Time 8   Period Weeks   Status On-going   PT LONG TERM GOAL #4   Title She will report being able to sit for 60 min without incr pain.   Time 8   Period Weeks   Status On-going   PT LONG TERM GOAL #5   Title She will report able to do dishes and cut fabric with minmal to no increase in pain   Time 8   Period Weeks   Status On-going               Plan - 07/14/15 1241    Clinical Impression Statement Ace reported getting injections in the low back last thursday so opted noted to perform dry needling today. performed manual trigger point release of the R mulftifidus and R QL with myofascial release which she reported  relief with IASTM and QL  stretching. Educated about postured and Midwife. following todays session she reported pain dropped to 2/10. pt declined modalities today. F   Pt will benefit from skilled therapeutic intervention in order to improve on the following deficits --  Bilateral low back pain without sciatica,Pain in thoracic spine,Cramp and spasm   PT Next Visit Plan Assess dry needling and goals, stretching, core strengthing,    Consulted and Agree with Plan of Care Patient        Problem List Patient Active Problem List   Diagnosis Date Noted  . Chronic systolic heart failure ([place]) 04/28/2015  . UTI (urinary tract infection) 03/22/2015  . Sepsis ([place]) 03/19/2015  . Acute kidney injury ([place]) 03/19/2015  . Diarrhea 03/18/2015  . CAP (community acquired pneumonia) 03/18/2015  . Nausea vomiting and diarrhea 03/18/2015  . Compression fracture 06/05/2014  . Essential hypertension 03/27/2014  . Chest pain 01/27/2014  . Normal coronary arteries 01/27/2014  . Obesity (BMI 30-39.9)   . ICD- MDT implanted Dec 2013 03/26/2013  . NICM (nonischemic cardiomyopathy) ([place]) 02/21/2013  . GERD (gastroesophageal reflux disease) 04/23/2012  . Asthma exacerbation 04/23/2012  . Shortness of breath 04/23/2012  . Syncope 03/01/2012  . Acute on chronic systolic CHF (congestive heart failure) ([place]) 03/01/2012  . Type B WPW syndrome- RFA Dec 2013 03/01/2012   Starr Lake PT, DPT, LAT, ATC  07/14/2015  12:55 PM      [facility] [address] [place], [place], [address] Phone: [phone]   Fax:  [phone]  Name: Paula Booker MRN: [id number] Date of Birth: 26-Jul-1945

## 2015-07-14 NOTE — Patient Instructions (Signed)

## 2015-07-16 ENCOUNTER — Ambulatory Visit: Payer: Medicare Other | Admitting: Physical Therapy

## 2015-07-20 ENCOUNTER — Ambulatory Visit: Payer: Medicare Other | Admitting: Physical Therapy

## 2015-07-20 DIAGNOSIS — M545 Low back pain, unspecified: Secondary | ICD-10-CM

## 2015-07-20 DIAGNOSIS — M546 Pain in thoracic spine: Secondary | ICD-10-CM

## 2015-07-20 DIAGNOSIS — R252 Cramp and spasm: Secondary | ICD-10-CM

## 2015-07-20 NOTE — Therapy (Signed)
[place], [place], [address] Phone: [phone]   Fax:  [phone]  Physical Therapy Treatment  Patient Details  Name: Paula Booker MRN: [id number] Date of Birth: 08/24/1945 Referring Provider: Phylliss Bob, MD  Encounter Date: 07/20/2015      PT End of Session - 07/20/15 0915    Visit Number 6   Number of Visits 16   Date for PT Re-Evaluation 08/03/15   Activity Tolerance Patient tolerated treatment well   Behavior During Therapy [facility] for tasks assessed/performed      Past Medical History  Diagnosis Date  . Asthma   . GERD (gastroesophageal reflux disease)   . Hypertension   . ICD (implantable cardiac defibrillator) in place Dec 2013  . Shortness of breath     "@ any time I can get SOB" (03/22/2012)  . Migraines   . Skin cancer 1999  . WPW (Wolff-Parkinson-White syndrome)   . Pacemaker   . CHF (congestive heart failure) ([place]) 6/13  . Pneumonia     hx  . Anxiety   . NICM (nonischemic cardiomyopathy) ([place])   . Obesity (BMI 30-39.9)     negative sleep study 2014  . Head injury, acute, with loss of consciousness ([place])   . Depression   . Arthritis     "hands and knees" (03/22/2012)    Past Surgical History  Procedure Laterality Date  . Cardiac defibrillator placement  03/22/2012    DDD  . Cholecystectomy  ~ 2003  . Tonsillectomy and adenoidectomy  1950  . Skin cancer excision  1999    "top of my head and my right back shoulder"  . Mohs surgery  06/2011    "forehead" (03/22/2012)  . Supraventricular tachycardia ablation  03/22/2012    unable to induce VT/RN report 03/22/2012  . Tubal ligation  1978  . Kyphoplasty N/A 11/27/2013    Procedure: KYPHOPLASTY;  Surgeon: Sinclair Ship, MD;  Location: [place];  Service: Orthopedics;  Laterality: N/A;  T9, T11 kyphoplasty  . Cardiac catheterization  09/18/11    no significant CAD  . Supraventricular tachycardia ablation N/A 03/22/2012     Procedure: SUPRAVENTRICULAR TACHYCARDIA ABLATION;  Surgeon: Evans Lance, MD;  Location: [facility] CATH LAB;  Service: Cardiovascular;  Laterality: N/A;  . Implantable cardioverter defibrillator implant N/A 03/22/2012    Procedure: IMPLANTABLE CARDIOVERTER DEFIBRILLATOR IMPLANT;  Surgeon: Evans Lance, MD;  Location: [facility] CATH LAB;  Service: Cardiovascular;  Laterality: N/A;  . Skin cancer excision      back  . Skin cancer excision      face  . Kyphoplasty N/A 06/05/2014    Procedure: KYPHOPLASTY;  Surgeon: Sinclair Ship, MD;  Location: [place];  Service: Orthopedics;  Laterality: N/A;  T7 kyphoplasty    There were no vitals filed for this visit.      Subjective Assessment - 07/20/15 0849    Subjective "I am having more of migraine and I am feeling just off, my back is doing well rated 3/10 and have been working the posture."   Currently in Pain? Yes   Pain Score 3    Pain Location Back   Pain Orientation Right   Pain Descriptors / Indicators Aching;Sore   Pain Onset More than a month ago   Pain Frequency Constant   Aggravating Factors  prolonged standing,    Pain Relieving Factors laying down, stretching  [facility] Adult PT Treatment/Exercise - 07/20/15 0001    Lumbar Exercises: Stretches   Lower Trunk Rotation 3 reps;30 seconds   Lumbar Exercises: Supine   Bent Knee Raise 10 reps   Knee/Hip Exercises: Supine   Other Supine Knee/Hip Exercises clams 1 x 15 with green theraband (only peformed 1 set due to pt having increase in migrain, decided to d/c fro todays session)                  PT Short Term Goals - 07/14/15 1244    PT SHORT TERM GOAL #1   Title She will be independent with initial HEP   Time 4   Period Weeks   Status Achieved   PT SHORT TERM GOAL #2   Title She will report pain eased 25% or more    Time 4   Period Weeks   Status On-going   PT SHORT TERM GOAL #3   Title She will report improved tolerance to  walking in [employer]   Time 4   Period Weeks   Status On-going           PT Long Term Goals - 07/06/15 1149    PT LONG TERM GOAL #1   Title She will be independent with all HEP issued as of last visit   Time 8   Period Weeks   Status On-going   PT LONG TERM GOAL #2   Title She will report with daily home tasks pain eased 50%     Time 8   Period Weeks   Status On-going   PT LONG TERM GOAL #3   Title She will report being able to walk to shop in [place] without increased pain   Time 8   Period Weeks   Status On-going   PT LONG TERM GOAL #4   Title She will report being able to sit for 60 min without incr pain.   Time 8   Period Weeks   Status On-going   PT LONG TERM GOAL #5   Title She will report able to do dishes and cut fabric with minmal to no increase in pain   Time 8   Period Weeks   Status On-going               Plan - 07/20/15 0916    Clinical Impression Statement Telah arrived to therapy with report of migraine that she has had since Thursday. she wanted to attempt therapy due to missing the last session. After performing 1 set of clam shells which took multiple rest breaks, she reported it was increasing her migraine symptoms. At this point due to pt's migraine impeding therapy decided to send her home, which pt agreed.    PT Next Visit Plan Assess dry needling and goals, stretching, core strengthing, make sure pt is feeling better with no mirgraine.   Consulted and Agree with Plan of Care Patient      Patient will benefit from skilled therapeutic intervention in order to improve the following deficits and impairments:     Visit Diagnosis: Bilateral low back pain without sciatica  Pain in thoracic spine  Cramp and spasm     Problem List Patient Active Problem List   Diagnosis Date Noted  . Chronic systolic heart failure ([place]) 04/28/2015  . UTI (urinary tract infection) 03/22/2015  . Sepsis ([place]) 03/19/2015  . Acute kidney injury ([place])  03/19/2015  . Diarrhea 03/18/2015  . CAP (community acquired pneumonia) 03/18/2015  . Nausea  vomiting and diarrhea 03/18/2015  . Compression fracture 06/05/2014  . Essential hypertension 03/27/2014  . Chest pain 01/27/2014  . Normal coronary arteries 01/27/2014  . Obesity (BMI 30-39.9)   . ICD- MDT implanted Dec 2013 03/26/2013  . NICM (nonischemic cardiomyopathy) ([place]) 02/21/2013  . GERD (gastroesophageal reflux disease) 04/23/2012  . Asthma exacerbation 04/23/2012  . Shortness of breath 04/23/2012  . Syncope 03/01/2012  . Acute on chronic systolic CHF (congestive heart failure) ([place]) 03/01/2012  . Type B WPW syndrome- RFA Dec 2013 03/01/2012   Starr Lake PT, DPT, LAT, ATC  07/20/2015  9:24 AM      [facility] [address] [place], [place], [address] Phone: [phone]   Fax:  [phone]  Name: Paula Booker MRN: [id number] Date of Birth: 1946-03-24

## 2015-07-22 ENCOUNTER — Ambulatory Visit: Payer: Medicare Other | Admitting: Physical Therapy

## 2015-07-28 ENCOUNTER — Ambulatory Visit (INDEPENDENT_AMBULATORY_CARE_PROVIDER_SITE_OTHER): Payer: Medicare Other | Admitting: *Deleted

## 2015-07-28 ENCOUNTER — Telehealth: Payer: Self-pay | Admitting: Cardiology

## 2015-07-28 DIAGNOSIS — I429 Cardiomyopathy, unspecified: Secondary | ICD-10-CM

## 2015-07-28 DIAGNOSIS — Z95810 Presence of automatic (implantable) cardiac defibrillator: Secondary | ICD-10-CM

## 2015-07-28 DIAGNOSIS — I428 Other cardiomyopathies: Secondary | ICD-10-CM

## 2015-07-28 NOTE — Telephone Encounter (Signed)
Spoke with pt and reminded pt of remote transmission that is due today. Pt verbalized understanding.   

## 2015-07-28 NOTE — Progress Notes (Signed)
Remote ICD transmission.   

## 2015-08-06 ENCOUNTER — Ambulatory Visit: Payer: Medicare Other | Admitting: Physical Therapy

## 2015-08-06 DIAGNOSIS — M25552 Pain in left hip: Secondary | ICD-10-CM

## 2015-08-06 DIAGNOSIS — R252 Cramp and spasm: Secondary | ICD-10-CM

## 2015-08-06 DIAGNOSIS — M546 Pain in thoracic spine: Secondary | ICD-10-CM

## 2015-08-06 DIAGNOSIS — M25551 Pain in right hip: Secondary | ICD-10-CM

## 2015-08-06 DIAGNOSIS — M5441 Lumbago with sciatica, right side: Secondary | ICD-10-CM

## 2015-08-06 DIAGNOSIS — M62830 Muscle spasm of back: Secondary | ICD-10-CM

## 2015-08-06 DIAGNOSIS — M545 Low back pain: Secondary | ICD-10-CM | POA: Diagnosis not present

## 2015-08-06 NOTE — Patient Instructions (Addendum)
Trigger Point Dry Needling  . What is Trigger Point Dry Needling (DN)? o DN is a physical therapy technique used to treat muscle pain and dysfunction. Specifically, DN helps deactivate muscle trigger points (muscle knots).  o A thin filiform needle is used to penetrate the skin and stimulate the underlying trigger point. The goal is for a local twitch response (LTR) to occur and for the trigger point to relax. No medication of any kind is injected during the procedure.   . What Does Trigger Point Dry Needling Feel Like?  o The procedure feels different for each individual patient. Some patients report that they do not actually feel the needle enter the skin and overall the process is not painful. Very mild bleeding may occur. However, many patients feel a deep cramping in the muscle in which the needle was inserted. This is the local twitch response.   Marland Kitchen How Will I feel after the treatment? o Soreness is normal, and the onset of soreness may not occur for a few hours. Typically this soreness does not last longer than two days.  o Bruising is uncommon, however; ice can be used to decrease any possible bruising.  o In rare cases feeling tired or nauseous after the treatment is normal. In addition, your symptoms may get worse before they get better, this period will typically not last longer than 24 hours.   . What Can I do After My Treatment? o Increase your hydration by drinking more water for the next 24 hours. o You may place ice or heat on the areas treated that have become sore, however, do not use heat on inflamed or bruised areas. Heat often brings more relief post needling. o You can continue your regular activities, but vigorous activity is not recommended initially after the treatment for 24 hours. o DN is best combined with other physical therapy such as strengthening, stretching, and other therapies.   Quadratus lumborum stretch as given before. reviewed  [name],  PT 08/06/2015 9:02 AM Phone: [phone] Fax: [phone]

## 2015-08-06 NOTE — Therapy (Signed)
[place], [place], [address] Phone: [phone]   Fax:  [phone]  Physical Therapy Treatment  Patient Details  Name: Paula Booker MRN: [id number] Date of Birth: 1945/09/16 Referring Provider: Phylliss Bob, MD  Encounter Date: 08/06/2015      PT End of Session - 08/06/15 0848    Visit Number 7   Number of Visits 16   Date for PT Re-Evaluation 09/03/15   Authorization Type Medicare     Authorization Time Period KX  at 15 visit   PT Start Time 0847   PT Stop Time 0940   PT Time Calculation (min) 53 min   Activity Tolerance Patient tolerated treatment well   Behavior During Therapy [facility] for tasks assessed/performed      Past Medical History  Diagnosis Date  . Asthma   . GERD (gastroesophageal reflux disease)   . Hypertension   . ICD (implantable cardiac defibrillator) in place Dec 2013  . Shortness of breath     "@ any time I can get SOB" (03/22/2012)  . Migraines   . Skin cancer 1999  . WPW (Wolff-Parkinson-White syndrome)   . Pacemaker   . CHF (congestive heart failure) ([place]) 6/13  . Pneumonia     hx  . Anxiety   . NICM (nonischemic cardiomyopathy) ([place])   . Obesity (BMI 30-39.9)     negative sleep study 2014  . Head injury, acute, with loss of consciousness ([place])   . Depression   . Arthritis     "hands and knees" (03/22/2012)    Past Surgical History  Procedure Laterality Date  . Cardiac defibrillator placement  03/22/2012    DDD  . Cholecystectomy  ~ 2003  . Tonsillectomy and adenoidectomy  1950  . Skin cancer excision  1999    "top of my head and my right back shoulder"  . Mohs surgery  06/2011    "forehead" (03/22/2012)  . Supraventricular tachycardia ablation  03/22/2012    unable to induce VT/RN report 03/22/2012  . Tubal ligation  1978  . Kyphoplasty N/A 11/27/2013    Procedure: KYPHOPLASTY;  Surgeon: Sinclair Ship, MD;  Location: [place];  Service: Orthopedics;   Laterality: N/A;  T9, T11 kyphoplasty  . Cardiac catheterization  09/18/11    no significant CAD  . Supraventricular tachycardia ablation N/A 03/22/2012    Procedure: SUPRAVENTRICULAR TACHYCARDIA ABLATION;  Surgeon: Evans Lance, MD;  Location: [facility] CATH LAB;  Service: Cardiovascular;  Laterality: N/A;  . Implantable cardioverter defibrillator implant N/A 03/22/2012    Procedure: IMPLANTABLE CARDIOVERTER DEFIBRILLATOR IMPLANT;  Surgeon: Evans Lance, MD;  Location: [facility] CATH LAB;  Service: Cardiovascular;  Laterality: N/A;  . Skin cancer excision      back  . Skin cancer excision      face  . Kyphoplasty N/A 06/05/2014    Procedure: KYPHOPLASTY;  Surgeon: Sinclair Ship, MD;  Location: [place];  Service: Orthopedics;  Laterality: N/A;  T7 kyphoplasty    There were no vitals filed for this visit.      Subjective Assessment - 08/06/15 0852    Subjective I had injections (6) in my both hip 3 in each hip in March.   I believe the dry needling helps me. I am unable to stand at kitchen sink and wash dishes.   Limitations Lifting;House hold activities   How long can you sit comfortably? 40 min   How long can you stand comfortably? 20 min on level  surface   How long can you walk comfortably?  300 ft   Currently in Pain? Yes   Pain Score 4    Pain Location Back   Pain Orientation Right;Left  right more than left   Pain Descriptors / Indicators Aching;Tightness;Sore   Pain Type Chronic pain   Pain Onset More than a month ago   Pain Frequency Constant   Aggravating Factors  prolonged standing   Pain Relieving Factors lie down and stretching   Pain Score 5   Pain Location Hip   Pain Orientation Right   Pain Descriptors / Indicators Aching   Pain Type Chronic pain   Pain Onset More than a month ago   Pain Frequency Constant            [facility] PT Assessment - 08/06/15 0913    Observation/Other Assessments   Oswestry Disability Index  40% disability    AROM   Lumbar Flexion 70    Lumbar Extension 25   Lumbar - Right Side Bend 35   Lumbar - Left Side Bend 25  pain   Lumbar - Right Rotation 70%   Lumbar - Left Rotation 90%   Strength   Overall Strength Comments grossly 4/5 in LE's abdominals 3/5    Palpation   Palpation comment Pt with marked tenderness over Right Quadratus lumborum and gluteal before RX, hypersensitive to touch Right side                     [facility] Adult PT Treatment/Exercise - 08/06/15 0903    Self-Care   Self-Care Heat/Ice Application   Heat/Ice Application educated  by telephone for use of ice for histamine reaction post needling. Pt already takeing an antihistamine for allergies.   Lumbar Exercises: Stretches   Lower Trunk Rotation 3 reps;30 seconds   Piriformis Stretch 2 reps;30 seconds  supine left knee over right knee   Lumbar Exercises: Sidelying   Other Sidelying Lumbar Exercises left sidelying Quadratus stretch with pillow under torso.  VC for correct position of right leg for maximizing stretch   Moist Heat Therapy   Number Minutes Moist Heat 15 Minutes   Moist Heat Location Lumbar Spine;Hip  left hip   Manual Therapy   Manual Therapy Joint mobilization;Soft tissue mobilization;Myofascial release   Joint Mobilization grade 1/2 spinous/ process Lumbar L2 to L5   Soft tissue mobilization IASTM of R lumbar spine and the Quadratus lomborum and gluteals   Myofascial Release Right Quadratus lumborum and R lumbar region rolling and stretching  Pt with hypesensitive rxn to light touch but decrease p RX          Trigger Point Dry Needling - 08/06/15 0901    Consent Given? Yes   Education Handout Provided Yes   Muscles Treated Upper Body Quadratus Lumborum;Longissimus;Piriformis;Gluteus maximus;Gluteus minimus  QL twitch and palpable lengthening.  Right side TDN only   Longissimus Response Twitch response elicited   Gluteus Maximus Response Twitch response elicited;Palpable increased muscle length   Gluteus Minimus  Response Twitch response elicited   Piriformis Response Twitch response elicited;Palpable increased muscle length              PT Education - 08/06/15 0902    Education provided Yes   Education Details reviewed trigger point dry needling and aftercare/precautians and stretches.   Person(s) Educated Patient   Methods Explanation;Demonstration;Handout   Comprehension Verbalized understanding;Returned demonstration          PT Short Term Goals - 08/06/15  0905    PT SHORT TERM GOAL #1   Title She will be independent with initial HEP   Time 4   Period Weeks   Status Achieved   PT SHORT TERM GOAL #2   Title She will report pain eased 25% or more    Baseline 50% better than on evaluaton   Time 4   Period Weeks   Status Achieved   PT SHORT TERM GOAL #3   Title She will report improved tolerance to walking in [employer]   Baseline able to walk 540feet and no more for one shopping visit   Time 4   Period Weeks   Status Achieved           PT Long Term Goals - 08/06/15 [id number]    PT LONG TERM GOAL #1   Title She will be independent with all HEP issued as of last visit   Time 4  8 weeks + 4   Period Weeks   Status On-going   PT LONG TERM GOAL #2   Title She will report with daily home tasks pain eased 50%     Baseline 50% reported today   Time 4   Period Weeks   Status Achieved   PT LONG TERM GOAL #3   Title She will report being able to walk to shop in [place] without increased pain   Baseline Can only walk 500 feet and no more   Time 4   Period Weeks   Status On-going   PT LONG TERM GOAL #4   Title She will report being able to sit for 60 min without incr pain.   Baseline Able to sit for 40 to 45 min   Time 4   Period Weeks   Status On-going   PT LONG TERM GOAL #5   Title She will report able to do dishes and cut fabric with minmal to no increase in pain   Baseline unable to do at present time.    Time 4   Period Weeks   Status On-going   PT LONG TERM GOAL  #6   Title Oswestry improved from 40% disabilty to at least 25% or better   Time 4   Period Weeks   Status New               Plan - 08/06/15 1001    Clinical Impression Statement Pt returns for PT and finalization of HEP and continuing dry needling for additional 4 weeks before DC to HEP.  Pt with pain level at 7/10 in back and hips and is hypersensitvie to touch by PT initially.  Pt consents to trigger point dry needling and is closely monitored throughout session.  STG;s have been  achieved, but pt will benefit from skilled PT and extending treatment 4 weeks to maximize benefit from trigger pioint dry needling and finalizing HEP for home use . Pt reports she has been trying to lose weight from 208 lb to 205. since 07-30-15.. Pt  has had 1 no show and 4 cancellations  and would like to complete appt for maximum benefit.  While on heat , Pt complained of ' burning on right hip/ back after hot packs removed.  Pt skin checked with normal redness of area and no evidence of burn.  Pt was offered  ice pack inclinic but pt declined.  Pt was told to call when she reached home.  Pt still complained of pain on right Quadratus lumborum burning/pain.  Pt  was adviced to put ice pack on  same area and monitor.  Pt with possible histamine reaction with trigger point dry needling.  Pt will be monitored for residual effects and proceed  with emphasis on therapeutic exericses and manual therapy as needed for next 4 weeks.   Pt verbalized instruction back to PT and stated she would contact PT if any further question.  Please extend this treatment to complete HEP and maximize functionof  Mrs. Cupit for next 4 weeks.    Rehab Potential Good   PT Frequency 2x / week   PT Duration 4 weeks   PT Treatment/Interventions Cryotherapy;Iontophoresis 4mg /ml Dexamethasone;Moist Heat;Ultrasound;Therapeutic exercise;Patient/family education;Taping;Manual techniques;Dry needling;Passive range of motion   PT Next Visit Plan  assess Right hip/ low back for skin irritation , assess benefit of Dry needling, proceed with back strength/ abdominal strenght   PT Home Exercise Plan transversus abdominis and left sidelying quadratusl lumborum stretch and supine piriformis stretch      Patient will benefit from skilled therapeutic intervention in order to improve the following deficits and impairments:  Postural dysfunction, Decreased activity tolerance, Impaired flexibility, Hypomobility, Increased fascial restricitons, Pain, Decreased range of motion  Visit Diagnosis: Cramp and spasm  Low back pain with right-sided sciatica, unspecified back pain laterality  Back muscle spasm  Pain in thoracic spine  Pain in right hip  Pain in left hip       G-Codes - 08-28-15 0956    Functional Assessment Tool Used oswestry   Functional Limitation Changing and maintaining body position   Changing and Maintaining Body Position Current Status [id number]) At least 40 percent but less than 60 percent impaired, limited or restricted  40%   Changing and Maintaining Body Position Goal Status [id number]) At least 20 percent but less than 40 percent impaired, limited or restricted      Problem List Patient Active Problem List   Diagnosis Date Noted  . Chronic systolic heart failure ([place]) 04/28/2015  . UTI (urinary tract infection) 03/22/2015  . Sepsis ([place]) 03/19/2015  . Acute kidney injury ([place]) 03/19/2015  . Diarrhea 03/18/2015  . CAP (community acquired pneumonia) 03/18/2015  . Nausea vomiting and diarrhea 03/18/2015  . Compression fracture 06/05/2014  . Essential hypertension 03/27/2014  . Chest pain 01/27/2014  . Normal coronary arteries 01/27/2014  . Obesity (BMI 30-39.9)   . ICD- MDT implanted Dec 2013 03/26/2013  . NICM (nonischemic cardiomyopathy) ([place]) 02/21/2013  . GERD (gastroesophageal reflux disease) 04/23/2012  . Asthma exacerbation 04/23/2012  . Shortness of breath 04/23/2012  . Syncope 03/01/2012  . Acute on  chronic systolic CHF (congestive heart failure) ([place]) 03/01/2012  . Type B WPW syndrome- RFA Dec 2013 03/01/2012    [name], PT Aug 28, 2015 1:41 PM Phone: [phone] Fax: [facility] [address] [place], [place], [address] Phone: [phone]   Fax:  [phone]  Name: Paula Booker MRN: [id number] Date of Birth: [birth year]

## 2015-08-10 ENCOUNTER — Ambulatory Visit: Payer: Medicare Other | Attending: Orthopedic Surgery | Admitting: Physical Therapy

## 2015-08-10 DIAGNOSIS — R252 Cramp and spasm: Secondary | ICD-10-CM | POA: Insufficient documentation

## 2015-08-10 DIAGNOSIS — M546 Pain in thoracic spine: Secondary | ICD-10-CM | POA: Insufficient documentation

## 2015-08-10 DIAGNOSIS — M25551 Pain in right hip: Secondary | ICD-10-CM | POA: Insufficient documentation

## 2015-08-10 DIAGNOSIS — M5441 Lumbago with sciatica, right side: Secondary | ICD-10-CM | POA: Insufficient documentation

## 2015-08-10 DIAGNOSIS — M25552 Pain in left hip: Secondary | ICD-10-CM | POA: Insufficient documentation

## 2015-08-17 ENCOUNTER — Ambulatory Visit: Payer: Medicare Other | Admitting: Physical Therapy

## 2015-08-24 ENCOUNTER — Ambulatory Visit: Payer: Medicare Other | Admitting: Physical Therapy

## 2015-08-24 DIAGNOSIS — M25551 Pain in right hip: Secondary | ICD-10-CM | POA: Diagnosis present

## 2015-08-24 DIAGNOSIS — R252 Cramp and spasm: Secondary | ICD-10-CM

## 2015-08-24 DIAGNOSIS — M5441 Lumbago with sciatica, right side: Secondary | ICD-10-CM | POA: Diagnosis present

## 2015-08-24 DIAGNOSIS — M546 Pain in thoracic spine: Secondary | ICD-10-CM | POA: Diagnosis present

## 2015-08-24 DIAGNOSIS — M25552 Pain in left hip: Secondary | ICD-10-CM | POA: Diagnosis present

## 2015-08-24 NOTE — Therapy (Addendum)
PT LONG TERM GOAL #6   Title  Oswestry improved from 40% disabilty to at least 25% or better   Time 4   Period Weeks   Status New         G-code: changing and maintaining body position goal status: CJ discharge status: CK       Plan - 08/24/15 1019    Clinical Impression Statement Paula Booker reports feeling no changes since in the last session. She provided consent for DN of the R quadratus lumborum; pt was monitored throughout treatment. following IASTM and stretch she reported relief of pain. educated about chronic pain and desentization techniques and  MHP prior to application this session and utilized 2 pillow cases and pt reported no problems.    PT Next Visit Plan , assess benefit of Dry needling, proceed with back strength/ abdominal strenght, stretching, posture   Consulted and Agree with Plan of Care Patient      Patient will benefit from skilled therapeutic intervention in order to improve the following deficits and impairments:  Postural dysfunction, Decreased activity tolerance, Impaired flexibility, Hypomobility, Increased fascial restricitons, Pain, Decreased range of motion  Visit Diagnosis: Cramp and spasm  Low back pain with right-sided sciatica, unspecified back pain laterality  Pain in thoracic spine  Pain in right hip  Pain in left hip     Problem List Patient Active Problem List   Diagnosis Date Noted  . Chronic systolic heart failure ([place]) 04/28/2015  . UTI (urinary tract infection) 03/22/2015  . Sepsis ([place]) 03/19/2015  . Acute kidney injury ([place]) 03/19/2015  . Diarrhea 03/18/2015  . CAP (community acquired pneumonia) 03/18/2015  . Nausea vomiting and diarrhea 03/18/2015  . Compression fracture 06/05/2014  . Essential hypertension 03/27/2014  . Chest pain 01/27/2014  . Normal coronary arteries 01/27/2014  . Obesity (BMI 30-39.9)   . ICD- MDT implanted Dec 2013 03/26/2013  . NICM (nonischemic cardiomyopathy) ([place]) 02/21/2013  . GERD (gastroesophageal reflux disease)  04/23/2012  . Asthma exacerbation 04/23/2012  . Shortness of breath 04/23/2012  . Syncope 03/01/2012  . Acute on chronic systolic CHF (congestive heart failure) ([place]) 03/01/2012  . Type B WPW syndrome- RFA Dec 2013 03/01/2012   Starr Lake PT, DPT, LAT, ATC  08/24/2015  10:22 AM      [facility] [address] [place], [place], [address] Phone: [phone]   Fax:  [phone]  Name: Paula Booker MRN: [id number] Date of Birth: 12-31-1945   PHYSICAL THERAPY DISCHARGE SUMMARY  Visits from Start of Care: 8  Current functional level related to goals / functional outcomes: See goals   Remaining deficits: Unknown due to pt not returning. PT called pt and she reported not being able to continue with physical therapy due to her heart.    Education / Equipment: HEP, posture education.   Plan: Patient agrees to discharge.  Patient goals were not met. Patient is being discharged due to a change in medical status.  ?????       Paula Booker PT, DPT, LAT, ATC  09/15/2015  2:02 PM  [place], [place], [address] Phone: [phone]   Fax:  [phone]  Physical Therapy Treatment / Discharge Note  Patient Details  Name: Paula Booker MRN: [id number] Date of Birth: 1945-08-22 Referring Provider: Phylliss Bob, MD  Encounter Date: 08/24/2015      PT End of Session - 08/24/15 1016    Visit Number 8   Number of Visits 16   Date for PT Re-Evaluation 09/03/15   Authorization Type Medicare     Authorization Time Period KX  at 15 visit   PT Start Time 0930   PT Stop Time 1022   PT Time Calculation (min) 52 min   Activity Tolerance Patient tolerated treatment well   Behavior During Therapy [facility] for tasks assessed/performed      Past Medical History  Diagnosis Date  . Asthma   . GERD (gastroesophageal reflux disease)   . Hypertension   . ICD (implantable cardiac defibrillator) in place Dec 2013  . Shortness of breath     "@ any time I can get SOB" (03/22/2012)  . Migraines   . Skin cancer 1999  . WPW (Wolff-Parkinson-White syndrome)   . Pacemaker   . CHF (congestive heart failure) ([place]) 6/13  . Pneumonia     hx  . Anxiety   . NICM (nonischemic cardiomyopathy) ([place])   . Obesity (BMI 30-39.9)     negative sleep study 2014  . Head injury, acute, with loss of consciousness ([place])   . Depression   . Arthritis     "hands and knees" (03/22/2012)    Past Surgical History  Procedure Laterality Date  . Cardiac defibrillator placement  03/22/2012    DDD  . Cholecystectomy  ~ 2003  . Tonsillectomy and adenoidectomy  1950  . Skin cancer excision  1999    "top of my head and my right back shoulder"  . Mohs surgery  06/2011    "forehead" (03/22/2012)  . Supraventricular tachycardia ablation  03/22/2012    unable to induce VT/RN report 03/22/2012  . Tubal ligation  1978  . Kyphoplasty N/A 11/27/2013    Procedure: KYPHOPLASTY;  Surgeon: Sinclair Ship, MD;  Location: [facility];   Service: Orthopedics;  Laterality: N/A;  T9, T11 kyphoplasty  . Cardiac catheterization  09/18/11    no significant CAD  . Supraventricular tachycardia ablation N/A 03/22/2012    Procedure: SUPRAVENTRICULAR TACHYCARDIA ABLATION;  Surgeon: Evans Lance, MD;  Location: [facility] CATH LAB;  Service: Cardiovascular;  Laterality: N/A;  . Implantable cardioverter defibrillator implant N/A 03/22/2012    Procedure: IMPLANTABLE CARDIOVERTER DEFIBRILLATOR IMPLANT;  Surgeon: Evans Lance, MD;  Location: [facility] CATH LAB;  Service: Cardiovascular;  Laterality: N/A;  . Skin cancer excision      back  . Skin cancer excision      face  . Kyphoplasty N/A 06/05/2014    Procedure: KYPHOPLASTY;  Surgeon: Sinclair Ship, MD;  Location: [facility];  Service: Orthopedics;  Laterality: N/A;  T7 kyphoplasty    There were no vitals filed for this visit.      Subjective Assessment - 08/24/15 0929    Subjective "back spams haven't been as bad"  still having alot back pain    Currently in Pain? Yes   Pain Score 6    Pain Location Back   Pain Type Chronic pain   Pain Onset More than a month ago   Pain Frequency Constant   Aggravating Factors  prolonged standing/ sitting,  [place], [place], [address] Phone: [phone]   Fax:  [phone]  Physical Therapy Treatment / Discharge Note  Patient Details  Name: Paula Booker MRN: [id number] Date of Birth: 1945-08-22 Referring Provider: Phylliss Bob, MD  Encounter Date: 08/24/2015      PT End of Session - 08/24/15 1016    Visit Number 8   Number of Visits 16   Date for PT Re-Evaluation 09/03/15   Authorization Type Medicare     Authorization Time Period KX  at 15 visit   PT Start Time 0930   PT Stop Time 1022   PT Time Calculation (min) 52 min   Activity Tolerance Patient tolerated treatment well   Behavior During Therapy [facility] for tasks assessed/performed      Past Medical History  Diagnosis Date  . Asthma   . GERD (gastroesophageal reflux disease)   . Hypertension   . ICD (implantable cardiac defibrillator) in place Dec 2013  . Shortness of breath     "@ any time I can get SOB" (03/22/2012)  . Migraines   . Skin cancer 1999  . WPW (Wolff-Parkinson-White syndrome)   . Pacemaker   . CHF (congestive heart failure) ([place]) 6/13  . Pneumonia     hx  . Anxiety   . NICM (nonischemic cardiomyopathy) ([place])   . Obesity (BMI 30-39.9)     negative sleep study 2014  . Head injury, acute, with loss of consciousness ([place])   . Depression   . Arthritis     "hands and knees" (03/22/2012)    Past Surgical History  Procedure Laterality Date  . Cardiac defibrillator placement  03/22/2012    DDD  . Cholecystectomy  ~ 2003  . Tonsillectomy and adenoidectomy  1950  . Skin cancer excision  1999    "top of my head and my right back shoulder"  . Mohs surgery  06/2011    "forehead" (03/22/2012)  . Supraventricular tachycardia ablation  03/22/2012    unable to induce VT/RN report 03/22/2012  . Tubal ligation  1978  . Kyphoplasty N/A 11/27/2013    Procedure: KYPHOPLASTY;  Surgeon: Sinclair Ship, MD;  Location: [facility];   Service: Orthopedics;  Laterality: N/A;  T9, T11 kyphoplasty  . Cardiac catheterization  09/18/11    no significant CAD  . Supraventricular tachycardia ablation N/A 03/22/2012    Procedure: SUPRAVENTRICULAR TACHYCARDIA ABLATION;  Surgeon: Evans Lance, MD;  Location: [facility] CATH LAB;  Service: Cardiovascular;  Laterality: N/A;  . Implantable cardioverter defibrillator implant N/A 03/22/2012    Procedure: IMPLANTABLE CARDIOVERTER DEFIBRILLATOR IMPLANT;  Surgeon: Evans Lance, MD;  Location: [facility] CATH LAB;  Service: Cardiovascular;  Laterality: N/A;  . Skin cancer excision      back  . Skin cancer excision      face  . Kyphoplasty N/A 06/05/2014    Procedure: KYPHOPLASTY;  Surgeon: Sinclair Ship, MD;  Location: [facility];  Service: Orthopedics;  Laterality: N/A;  T7 kyphoplasty    There were no vitals filed for this visit.      Subjective Assessment - 08/24/15 0929    Subjective "back spams haven't been as bad"  still having alot back pain    Currently in Pain? Yes   Pain Score 6    Pain Location Back   Pain Type Chronic pain   Pain Onset More than a month ago   Pain Frequency Constant   Aggravating Factors  prolonged standing/ sitting,

## 2015-08-31 ENCOUNTER — Encounter: Payer: Medicare Other | Admitting: Physical Therapy

## 2015-09-01 ENCOUNTER — Encounter: Payer: Medicare Other | Admitting: Physical Therapy

## 2015-09-03 ENCOUNTER — Encounter: Payer: Self-pay | Admitting: Cardiology

## 2015-09-03 LAB — CUP PACEART REMOTE DEVICE CHECK
Brady Statistic AP VS Percent: 7.29 %
Brady Statistic AS VP Percent: 0.03 %
Brady Statistic RA Percent Paced: 7.3 %
Brady Statistic RV Percent Paced: 0.04 %
HighPow Impedance: 82 Ohm
Implantable Lead Location: 753860
Implantable Lead Model: 5076
Implantable Lead Model: 6935
Lead Channel Impedance Value: 589 Ohm
Lead Channel Impedance Value: 779 Ohm
Lead Channel Pacing Threshold Amplitude: 0.5 V
Lead Channel Sensing Intrinsic Amplitude: 3.375 mV
Lead Channel Setting Sensing Sensitivity: 0.3 mV
MDC IDC LEAD IMPLANT DT: 20131212
MDC IDC LEAD IMPLANT DT: 20131212
MDC IDC LEAD LOCATION: 753859
MDC IDC MSMT BATTERY REMAINING LONGEVITY: 96 mo
MDC IDC MSMT BATTERY VOLTAGE: 3 V
MDC IDC MSMT LEADCHNL RA IMPEDANCE VALUE: 475 Ohm
MDC IDC MSMT LEADCHNL RA PACING THRESHOLD PULSEWIDTH: 0.4 ms
MDC IDC MSMT LEADCHNL RA SENSING INTR AMPL: 3.375 mV
MDC IDC MSMT LEADCHNL RV PACING THRESHOLD AMPLITUDE: 0.625 V
MDC IDC MSMT LEADCHNL RV PACING THRESHOLD PULSEWIDTH: 0.4 ms
MDC IDC MSMT LEADCHNL RV SENSING INTR AMPL: 9.25 mV
MDC IDC MSMT LEADCHNL RV SENSING INTR AMPL: 9.25 mV
MDC IDC SESS DTM: 20170418182056
MDC IDC SET LEADCHNL RA PACING AMPLITUDE: 2 V
MDC IDC SET LEADCHNL RV PACING AMPLITUDE: 2.5 V
MDC IDC SET LEADCHNL RV PACING PULSEWIDTH: 0.4 ms
MDC IDC STAT BRADY AP VP PERCENT: 0.01 %
MDC IDC STAT BRADY AS VS PERCENT: 92.67 %

## 2015-09-15 ENCOUNTER — Telehealth: Payer: Self-pay | Admitting: Physical Therapy

## 2015-09-15 NOTE — Telephone Encounter (Signed)
Pt reported she cannot do physical therapy at this point in time due to her heart and that she is current on bed rest. Educated that if she wants to return to physical therapy that she will need a new referral which pt reported that she understood.

## 2015-09-21 ENCOUNTER — Encounter: Payer: Self-pay | Admitting: Cardiology

## 2015-10-18 ENCOUNTER — Other Ambulatory Visit: Payer: Self-pay | Admitting: Interventional Cardiology

## 2015-10-27 ENCOUNTER — Ambulatory Visit (INDEPENDENT_AMBULATORY_CARE_PROVIDER_SITE_OTHER): Payer: Medicare Other | Admitting: *Deleted

## 2015-10-27 DIAGNOSIS — I429 Cardiomyopathy, unspecified: Secondary | ICD-10-CM

## 2015-10-27 DIAGNOSIS — I428 Other cardiomyopathies: Secondary | ICD-10-CM

## 2015-10-27 NOTE — Progress Notes (Signed)
Remote ICD transmission.   

## 2015-10-29 ENCOUNTER — Encounter: Payer: Self-pay | Admitting: Cardiology

## 2015-11-09 LAB — CUP PACEART REMOTE DEVICE CHECK
Battery Remaining Longevity: 89 mo
Battery Voltage: 3 V
Brady Statistic AP VP Percent: 0.01 %
Brady Statistic AS VP Percent: 0.02 %
Brady Statistic RV Percent Paced: 0.04 %
Date Time Interrogation Session: 20170718162711
HIGH POWER IMPEDANCE MEASURED VALUE: 82 Ohm
Implantable Lead Implant Date: 20131212
Implantable Lead Location: 753859
Lead Channel Impedance Value: 646 Ohm
Lead Channel Pacing Threshold Amplitude: 0.5 V
Lead Channel Pacing Threshold Amplitude: 0.5 V
Lead Channel Sensing Intrinsic Amplitude: 8.75 mV
Lead Channel Sensing Intrinsic Amplitude: 8.75 mV
Lead Channel Setting Pacing Amplitude: 2 V
Lead Channel Setting Pacing Amplitude: 2.5 V
Lead Channel Setting Pacing Pulse Width: 0.4 ms
Lead Channel Setting Sensing Sensitivity: 0.3 mV
MDC IDC LEAD IMPLANT DT: 20131212
MDC IDC LEAD LOCATION: 753860
MDC IDC LEAD MODEL: 6935
MDC IDC MSMT LEADCHNL RA IMPEDANCE VALUE: 513 Ohm
MDC IDC MSMT LEADCHNL RA PACING THRESHOLD PULSEWIDTH: 0.4 ms
MDC IDC MSMT LEADCHNL RA SENSING INTR AMPL: 3.625 mV
MDC IDC MSMT LEADCHNL RA SENSING INTR AMPL: 3.625 mV
MDC IDC MSMT LEADCHNL RV IMPEDANCE VALUE: 817 Ohm
MDC IDC MSMT LEADCHNL RV PACING THRESHOLD PULSEWIDTH: 0.4 ms
MDC IDC STAT BRADY AP VS PERCENT: 8.92 %
MDC IDC STAT BRADY AS VS PERCENT: 91.04 %
MDC IDC STAT BRADY RA PERCENT PACED: 8.93 %

## 2015-11-13 ENCOUNTER — Other Ambulatory Visit: Payer: Self-pay | Admitting: Interventional Cardiology

## 2015-11-18 ENCOUNTER — Encounter: Payer: Self-pay | Admitting: Cardiology

## 2015-11-18 NOTE — Progress Notes (Signed)
Letter  

## 2015-11-20 ENCOUNTER — Telehealth: Payer: Self-pay

## 2015-11-20 NOTE — Telephone Encounter (Signed)
Referred to ICM clinic by Debroah Loop, Device RN/Dr Lovena Le.  Attempted ICM intro call and left message for return call.

## 2015-11-20 NOTE — Telephone Encounter (Signed)
Spoke with patient and explained ICM program.  She agreed to monthly ICM calls. She stated she needed her Furosemide script to be 90 days and explained she needs to make an appointment with Dr Irish Lack to continue refill on Furosemide. She stated she will do that today.  1st ICM remote transmission scheduled for 12/09/2015.

## 2015-11-24 NOTE — Progress Notes (Signed)
Patient ID: Paula Booker, female   DOB: 07-18-1945, 70 y.o.   MRN: [id number]     Cardiology Office Note   Date:  11/25/2015   ID:  Paula Booker, DOB [birth year], MRN [id number]  PCP:  Antony Blackbird, MD    No chief complaint on file. shortness of breath  Wt Readings from Last 3 Encounters:  11/25/15 201 lb 12.8 oz (91.5 kg)  04/28/15 206 lb 12.8 oz (93.8 kg)  04/03/15 201 lb (91.2 kg)       History of Present Illness: Paula Booker is a 70 y.o. female  who has a nonischemic cardiomyopathy. She has had WPW diagnosed on ECG as well by Dr. Lovena Le. She has had ICD implant and EP study for WPW with no inducible arrhythmia. Cardiomyopathy:   c/o Shortness of breath with exertion, persistent Chest pain - nothing significant  Denies:Palpitations  Complains [id number].  Leg edema.   Hospitalized in Dec 2016 for sepsis, UTI, pneumonia.  SHe was in the hospital for about a week.    SHe has been active since that time.  She has been doing home improvement projects.  She sanded the floors.  She was told recently that her AICD detected extra fluid in her system.   Past Medical History:  Diagnosis Date  . Anxiety   . Arthritis    "hands and knees" (03/22/2012)  . Asthma   . CHF (congestive heart failure) ([place]) 6/13  . Depression   . GERD (gastroesophageal reflux disease)   . Head injury, acute, with loss of consciousness ([place])   . Hypertension   . ICD (implantable cardiac defibrillator) in place Dec 2013  . Migraines   . NICM (nonischemic cardiomyopathy) ([place])   . Obesity (BMI 30-39.9)    negative sleep study 2014  . Pacemaker   . Pneumonia    hx  . Shortness of breath    "@ any time I can get SOB" (03/22/2012)  . Skin cancer 1999  . WPW (Wolff-Parkinson-White syndrome)     Past Surgical History:  Procedure Laterality Date  . CARDIAC CATHETERIZATION  09/18/11   no significant CAD  . CARDIAC DEFIBRILLATOR PLACEMENT  03/22/2012   DDD  . CHOLECYSTECTOMY  ~ 2003  .  IMPLANTABLE CARDIOVERTER DEFIBRILLATOR IMPLANT N/A 03/22/2012   Procedure: IMPLANTABLE CARDIOVERTER DEFIBRILLATOR IMPLANT;  Surgeon: Evans Lance, MD;  Location: [facility] CATH LAB;  Service: Cardiovascular;  Laterality: N/A;  . KYPHOPLASTY N/A 11/27/2013   Procedure: KYPHOPLASTY;  Surgeon: Sinclair Ship, MD;  Location: [facility];  Service: Orthopedics;  Laterality: N/A;  T9, T11 kyphoplasty  . KYPHOPLASTY N/A 06/05/2014   Procedure: KYPHOPLASTY;  Surgeon: Sinclair Ship, MD;  Location: [facility];  Service: Orthopedics;  Laterality: N/A;  T7 kyphoplasty  . MOHS SURGERY  06/2011   "forehead" (03/22/2012)  . SKIN CANCER EXCISION  1999   "top of my head and my right back shoulder"  . SKIN CANCER EXCISION     back  . SKIN CANCER EXCISION     face  . SUPRAVENTRICULAR TACHYCARDIA ABLATION  03/22/2012   unable to induce VT/RN report 03/22/2012  . SUPRAVENTRICULAR TACHYCARDIA ABLATION N/A 03/22/2012   Procedure: SUPRAVENTRICULAR TACHYCARDIA ABLATION;  Surgeon: Evans Lance, MD;  Location: [facility] CATH LAB;  Service: Cardiovascular;  Laterality: N/A;  . TONSILLECTOMY AND ADENOIDECTOMY  1950  . TUBAL LIGATION  1978     Current Outpatient Prescriptions  Medication Sig Dispense Refill  . albuterol (PROVENTIL HFA;VENTOLIN HFA) 108 (90 BASE) MCG/ACT inhaler Inhale  2 puffs into the lungs every 6 (six) hours as needed. For wheezing    . alendronate (FOSAMAX) 70 MG tablet Take 70 mg by mouth once a week. Take with a full glass of water on an empty stomach.    . carvedilol (COREG) 12.5 MG tablet Take 1 tablet (12.5 mg total) by mouth 2 (two) times daily with a meal. 180 tablet 3  . cetirizine (ZYRTEC) 10 MG tablet Take 10 mg by mouth daily.    . Cholecalciferol (VITAMIN D3) 1000 UNITS CAPS Take 1,000 Units by mouth daily.     . citalopram (CELEXA) 20 MG tablet Take 20 mg by mouth as directed.    . furosemide (LASIX) 40 MG tablet Take 1 tablet (40 mg total) by mouth daily. Call and schedule follow up office  visit to receive further refills. Thank you. [phone] 30 tablet 0  . losartan (COZAAR) 25 MG tablet Take 25 mg by mouth 2 (two) times daily.    . meloxicam (MOBIC) 15 MG tablet Take 15 mg by mouth daily.    Marland Kitchen omeprazole (PRILOSEC) 40 MG capsule Take 40 mg by mouth daily.    Marland Kitchen spironolactone (ALDACTONE) 25 MG tablet TAKE ONE TABLET BY MOUTH ONCE DAILY. 90 tablet 1   No current facility-administered medications for this visit.     Allergies:   Ivp dye [iodinated diagnostic agents]; Penicillins; Sulfonamide derivatives; Tessalon [benzonatate]; Hydrocodone-acetaminophen; Penicillin g; Rosuvastatin calcium; and Onion    Social History:  The patient  reports that she has never smoked. She has never used smokeless tobacco. She reports that she does not drink alcohol or use drugs.   Family History:  The patient's family history includes Atrial fibrillation in her brother and mother; Congestive Heart Failure in her brother, father, and mother; Deafness in her father and mother; Healthy in her sister, sister, and sister; Kidney Stones in her mother; Tuberculosis in her paternal grandfather.    ROS:  Please see the history of present illness.   Otherwise, review of systems are positive for chest pain that feels like heart burn and shortness of breath.   All other systems are reviewed and negative.    PHYSICAL EXAM: VS:  BP 112/68   Pulse 78   Ht 5\' 3"  (1.6 m)   Wt 201 lb 12.8 oz (91.5 kg)   SpO2 98%   BMI 35.75 kg/m  , BMI Body mass index is 35.75 kg/m. GEN: Well nourished, well developed, in no acute distress  HEENT: normal  Neck: no JVD, carotid bruits, or masses Cardiac: RRR; no murmurs, rubs, or gallops,no edema  Respiratory:  clear to auscultation bilaterally, normal work of breathing GI: soft, nontender, nondistended, + BS MS: no deformity or atrophy , trace leg edema Skin: warm and dry, no rash Neuro:  Strength and sensation are intact Psych: euthymic mood, full affect   EKG:    The ekg ordered today demonstrates normal sinus rhythm, left bundle branch block   Recent Labs: 03/19/2015: ALT 12 03/20/2015: Magnesium 2.5 04/03/2015: B Natriuretic Peptide 75.1; BUN 11; Creatinine, Ser 0.87; Hemoglobin 12.7; Platelets 412; Potassium 4.2; Sodium 139   Lipid Panel No results found for: CHOL, TRIG, HDL, CHOLHDL, VLDL, LDLCALC, LDLDIRECT   Other studies Reviewed: Additional studies/ records that were reviewed today with results demonstrating: 2013 cath with no significant coronary artery disease.   ASSESSMENT AND PLAN:  1. Chest pain: Improved.  Less frequent.  SHe can discern heart burn better.   2. Nonischemic cardiomyopathy/Chronic systolic heart  failure:Leg swelling: continue diuretics-see below. Elevate legs to help prevent swelling.  She also reports shortness of breath. Will check BNP and electrolytes given the Aldactone. 3. Hypertension: Continue antihypertensives.  BP well controlled.   On exam, she does not appear to be fluid overloaded. Increase Lasix to 80 mg daily for 3 days, then return to 40 mg daily.  Current medicines are reviewed at length with the patient today.  The patient concerns regarding her medicines were addressed.  The following changes have been made:  No change   Labs/ tests ordered today include:   No orders of the defined types were placed in this encounter.   Recommend 150 minutes/week of aerobic exercise Low fat, low carb, high fiber diet recommended  Disposition:   FU in 1 year,   Signed, Larae Grooms, MD  11/25/2015 8:56 AM    [facility] [address], [place], [place]  [address] Phone: [phone]; Fax: [phone]

## 2015-11-25 ENCOUNTER — Encounter: Payer: Self-pay | Admitting: Interventional Cardiology

## 2015-11-25 ENCOUNTER — Ambulatory Visit (INDEPENDENT_AMBULATORY_CARE_PROVIDER_SITE_OTHER): Payer: Medicare Other | Admitting: Interventional Cardiology

## 2015-11-25 VITALS — BP 112/68 | HR 78 | Ht 63.0 in | Wt 201.8 lb

## 2015-11-25 DIAGNOSIS — I10 Essential (primary) hypertension: Secondary | ICD-10-CM | POA: Diagnosis not present

## 2015-11-25 DIAGNOSIS — Z95810 Presence of automatic (implantable) cardiac defibrillator: Secondary | ICD-10-CM

## 2015-11-25 DIAGNOSIS — I5022 Chronic systolic (congestive) heart failure: Secondary | ICD-10-CM | POA: Diagnosis not present

## 2015-11-25 DIAGNOSIS — I428 Other cardiomyopathies: Secondary | ICD-10-CM

## 2015-11-25 DIAGNOSIS — I429 Cardiomyopathy, unspecified: Secondary | ICD-10-CM | POA: Diagnosis not present

## 2015-11-25 MED ORDER — FUROSEMIDE 40 MG PO TABS: ORAL_TABLET | ORAL | 3 refills | Status: DC

## 2015-11-25 NOTE — Patient Instructions (Signed)
Medication Instructions:  Increase Lasix to 1 to 2 tablets daily.  Take 80 mg (2 tablets) for 3 days then decrease to 1 to 2 tablets daily. All other medications remain the same.  Labwork: None  Testing/Procedures: None  Follow-Up: Your physician wants you to follow-up in: 1 year. You will receive a reminder letter in the mail two months in advance. If you don't receive a letter, please call our office to schedule the follow-up appointment.     If you need a refill on your cardiac medications before your next appointment, please call your pharmacy.

## 2015-12-09 ENCOUNTER — Telehealth: Payer: Self-pay | Admitting: Cardiology

## 2015-12-09 ENCOUNTER — Ambulatory Visit (INDEPENDENT_AMBULATORY_CARE_PROVIDER_SITE_OTHER): Payer: Medicare Other

## 2015-12-09 DIAGNOSIS — I5022 Chronic systolic (congestive) heart failure: Secondary | ICD-10-CM

## 2015-12-09 DIAGNOSIS — Z95810 Presence of automatic (implantable) cardiac defibrillator: Secondary | ICD-10-CM | POA: Diagnosis not present

## 2015-12-09 NOTE — Telephone Encounter (Signed)
LMOVM reminding pt to send remote transmission.   

## 2015-12-09 NOTE — Progress Notes (Signed)
EPIC Encounter for ICM Monitoring  Patient Name: Paula Booker is a 69 y.o. female Date: 12/09/2015 Primary Care Physican: Antony Blackbird, MD Primary Cardiologist: Irish Lack Electrophysiologist: Druscilla Brownie Weight: unknown        Heart Failure questions reviewed, pt asymptomatic.  Reviewed fluid symptoms to report.  Dr Irish Lack stated she can take extra Furosemide if needed for fluid symptoms.   Thoracic impedance normal since 12/02/2015.  Impedance abnormal suggesting fluid accumulation from 11/04/2015 to 12/01/2015.  Recommendations: No changes.  Reviewed low sodium diet and advised to limit salt intake to 2000 mg and fluid intake to 2 liters day.      Follow-up plan: ICM clinic phone appointment on 01/12/2016.  Copy of ICM check sent to device physician.   ICM trend: 12/09/2015       [name], RN 12/09/2015 1:36 PM

## 2016-01-12 ENCOUNTER — Ambulatory Visit (INDEPENDENT_AMBULATORY_CARE_PROVIDER_SITE_OTHER): Payer: Medicare Other

## 2016-01-12 DIAGNOSIS — Z95810 Presence of automatic (implantable) cardiac defibrillator: Secondary | ICD-10-CM

## 2016-01-12 DIAGNOSIS — I5022 Chronic systolic (congestive) heart failure: Secondary | ICD-10-CM | POA: Diagnosis not present

## 2016-01-12 NOTE — Progress Notes (Signed)
EPIC Encounter for ICM Monitoring  Patient Name: Paula Booker is a 70 y.o. female Date: 01/12/2016 Primary Care Physican: Antony Blackbird, MD Primary Cardiologist: Irish Lack Electrophysiologist: Druscilla Brownie Weight: 204 lb       Heart Failure questions reviewed, pt asymptomatic.  She did have some weight gain in the last few weeks but has resolved since taking extra Furosemide as needed.    Thoracic impedance returned to normal 01/12/2016.   Recommendations:  She manages any fluid symptoms by taking extra Furosemide.    Follow-up plan: ICM clinic phone appointment on 02/16/2016.  Copy of ICM check sent to device physician.   ICM trend: 01/12/2016       [name], RN 01/12/2016 1:32 PM

## 2016-01-21 ENCOUNTER — Telehealth: Payer: Self-pay | Admitting: Interventional Cardiology

## 2016-01-21 NOTE — Telephone Encounter (Signed)
**Note De-Identified  Obfuscation** The pt is advised to contact her PCP concerning her bleeding/clotting concerns. She verbalized understanding and thanked me for returning her call.

## 2016-01-21 NOTE — Telephone Encounter (Signed)
New Message  Pt voiced needs to know if she needs to get her INR check because she accidentally cut her leg and instead of it bleeding it clotted up.  Please f/u with pt

## 2016-01-21 NOTE — Telephone Encounter (Signed)
No answer and no way to leave a message. Will continue to call. 

## 2016-01-26 ENCOUNTER — Ambulatory Visit (INDEPENDENT_AMBULATORY_CARE_PROVIDER_SITE_OTHER): Payer: Medicare Other | Admitting: *Deleted

## 2016-01-26 DIAGNOSIS — I428 Other cardiomyopathies: Secondary | ICD-10-CM | POA: Diagnosis not present

## 2016-01-26 NOTE — Progress Notes (Signed)
Remote ICD transmission.   

## 2016-01-27 ENCOUNTER — Encounter: Payer: Self-pay | Admitting: Cardiology

## 2016-02-05 ENCOUNTER — Other Ambulatory Visit: Payer: Self-pay | Admitting: Interventional Cardiology

## 2016-02-05 LAB — CUP PACEART REMOTE DEVICE CHECK
Brady Statistic AP VS Percent: 5.56 %
Brady Statistic AS VP Percent: 0.03 %
Brady Statistic RA Percent Paced: 5.58 %
Date Time Interrogation Session: 20171017052306
HighPow Impedance: 82 Ohm
Implantable Lead Implant Date: 20131212
Implantable Lead Location: 753859
Implantable Lead Model: 5076
Implantable Lead Model: 6935
Lead Channel Impedance Value: 475 Ohm
Lead Channel Impedance Value: 817 Ohm
Lead Channel Pacing Threshold Amplitude: 0.5 V
Lead Channel Sensing Intrinsic Amplitude: 3.625 mV
Lead Channel Sensing Intrinsic Amplitude: 8.125 mV
Lead Channel Setting Pacing Amplitude: 2.5 V
Lead Channel Setting Pacing Pulse Width: 0.4 ms
MDC IDC LEAD IMPLANT DT: 20131212
MDC IDC LEAD LOCATION: 753860
MDC IDC MSMT BATTERY REMAINING LONGEVITY: 89 mo
MDC IDC MSMT BATTERY VOLTAGE: 3 V
MDC IDC MSMT LEADCHNL RA PACING THRESHOLD AMPLITUDE: 0.5 V
MDC IDC MSMT LEADCHNL RA PACING THRESHOLD PULSEWIDTH: 0.4 ms
MDC IDC MSMT LEADCHNL RA SENSING INTR AMPL: 3.625 mV
MDC IDC MSMT LEADCHNL RV IMPEDANCE VALUE: 665 Ohm
MDC IDC MSMT LEADCHNL RV PACING THRESHOLD PULSEWIDTH: 0.4 ms
MDC IDC MSMT LEADCHNL RV SENSING INTR AMPL: 8.125 mV
MDC IDC SET LEADCHNL RA PACING AMPLITUDE: 2 V
MDC IDC SET LEADCHNL RV SENSING SENSITIVITY: 0.3 mV
MDC IDC STAT BRADY AP VP PERCENT: 0.01 %
MDC IDC STAT BRADY AS VS PERCENT: 94.39 %
MDC IDC STAT BRADY RV PERCENT PACED: 0.04 %

## 2016-02-12 ENCOUNTER — Encounter: Payer: Self-pay | Admitting: Cardiology

## 2016-02-16 ENCOUNTER — Ambulatory Visit (INDEPENDENT_AMBULATORY_CARE_PROVIDER_SITE_OTHER): Payer: Medicare Other

## 2016-02-16 DIAGNOSIS — Z95810 Presence of automatic (implantable) cardiac defibrillator: Secondary | ICD-10-CM

## 2016-02-16 DIAGNOSIS — I5022 Chronic systolic (congestive) heart failure: Secondary | ICD-10-CM

## 2016-02-16 NOTE — Progress Notes (Signed)
EPIC Encounter for ICM Monitoring  Patient Name: Paula Booker is a 70 y.o. female Date: 02/16/2016 Primary Care Physican: Antony Blackbird, MD Primary [place] Electrophysiologist: Lovena Le Dry Weight: unknown       Attempted ICM call and unable to reach. Left detailed message regarding transmission.  Transmission reviewed.   Thoracic impedance normal   Follow-up plan: ICM clinic phone appointment on 03/18/2016.  Copy of ICM check sent to device physician.   ICM trend: 02/16/2016       [name], RN 02/16/2016 1:07 PM

## 2016-03-17 ENCOUNTER — Telehealth: Payer: Self-pay | Admitting: Cardiology

## 2016-03-17 NOTE — Telephone Encounter (Signed)
Reviewed manual transmission received today at 1712.  No CareAlert conditions met, lead trends and battery longevity stable. 1 NSVT episode, ~6 beats.  Presenting rhythm As/Vs w/PVCs.  Spoke with patient, she reports she was watching a [employer] movie on tv and sitting in her chair.  She denies being nearby any magnets and states that the movie did not have any similar sounds in it.  She reports the alert tone sounded "like a European siren" and that it was not a solid tone like the magnet alert.  Patient was using a table saw earlier in the day, but states that it is well grounded and that she is very careful.  She heard the alert tone a few hours later.  Patient is going out of town tomorrow.  Advised patient to sleep near her monitor tonight in the event that an alert condition has been met and she verbalizes understanding.  She agrees to call the [facility] if she hears the alert tone again.  Patient is appreciative of assistance and denies additional questions or concerns at this time.

## 2016-03-17 NOTE — Telephone Encounter (Signed)
Pt called and stated that she heard her ICD alarm going to off. Instructed pt to send a remote transmission w/ her home monitor. Pt verbalized understanding.

## 2016-03-18 ENCOUNTER — Ambulatory Visit (INDEPENDENT_AMBULATORY_CARE_PROVIDER_SITE_OTHER): Payer: Medicare Other

## 2016-03-18 DIAGNOSIS — I5022 Chronic systolic (congestive) heart failure: Secondary | ICD-10-CM | POA: Diagnosis not present

## 2016-03-18 DIAGNOSIS — Z95810 Presence of automatic (implantable) cardiac defibrillator: Secondary | ICD-10-CM

## 2016-03-18 NOTE — Progress Notes (Signed)
EPIC Encounter for ICM Monitoring  Patient Name: Paula Booker is a 70 y.o. female Date: 03/18/2016 Primary Care Physican: Antony Blackbird, MD Primary [place] Electrophysiologist: Lovena Le Dry Weight:    unknown      Heart Failure questions reviewed, pt asymptomatic   Thoracic impedance normal   Recommendations:   No changes.  Reinforced to limit low salt food choices to 2000 mg day and limiting fluid intake to < 2 liters per day. Encouraged to call for fluid symptoms.    Follow-up plan: ICM clinic phone appointment on 04/19/2015.  Copy of ICM check sent to device physician.   ICM trend: 03/18/2016       Paula Billings, RN 03/18/2016 11:07 AM

## 2016-04-05 ENCOUNTER — Encounter (HOSPITAL_COMMUNITY): Payer: Self-pay

## 2016-04-05 ENCOUNTER — Emergency Department (HOSPITAL_COMMUNITY)
Admission: EM | Admit: 2016-04-05 | Discharge: 2016-04-05 | Disposition: A | Discharge status: Home / Self Care | Payer: Medicare Other | Attending: Emergency Medicine | Admitting: Emergency Medicine

## 2016-04-05 ENCOUNTER — Emergency Department (HOSPITAL_COMMUNITY): Payer: Medicare Other

## 2016-04-05 ENCOUNTER — Telehealth: Payer: Self-pay | Admitting: Interventional Cardiology

## 2016-04-05 DIAGNOSIS — I5023 Acute on chronic systolic (congestive) heart failure: Secondary | ICD-10-CM | POA: Diagnosis not present

## 2016-04-05 DIAGNOSIS — J45909 Unspecified asthma, uncomplicated: Secondary | ICD-10-CM | POA: Diagnosis not present

## 2016-04-05 DIAGNOSIS — R1013 Epigastric pain: Secondary | ICD-10-CM

## 2016-04-05 DIAGNOSIS — K2970 Gastritis, unspecified, without bleeding: Secondary | ICD-10-CM | POA: Diagnosis not present

## 2016-04-05 DIAGNOSIS — R0602 Shortness of breath: Secondary | ICD-10-CM | POA: Insufficient documentation

## 2016-04-05 DIAGNOSIS — Z95810 Presence of automatic (implantable) cardiac defibrillator: Secondary | ICD-10-CM | POA: Insufficient documentation

## 2016-04-05 DIAGNOSIS — K219 Gastro-esophageal reflux disease without esophagitis: Secondary | ICD-10-CM | POA: Diagnosis not present

## 2016-04-05 DIAGNOSIS — Z85828 Personal history of other malignant neoplasm of skin: Secondary | ICD-10-CM | POA: Insufficient documentation

## 2016-04-05 DIAGNOSIS — I110 Hypertensive heart disease with heart failure: Secondary | ICD-10-CM | POA: Diagnosis not present

## 2016-04-05 DIAGNOSIS — R110 Nausea: Secondary | ICD-10-CM

## 2016-04-05 DIAGNOSIS — R0789 Other chest pain: Secondary | ICD-10-CM

## 2016-04-05 LAB — CBC WITH DIFFERENTIAL/PLATELET
Basophils Absolute: 0 10*3/uL (ref 0.0–0.1)
Basophils Relative: 0 %
EOS PCT: 9 %
Eosinophils Absolute: 0.8 10*3/uL — ABNORMAL HIGH (ref 0.0–0.7)
HCT: 39.2 % (ref 36.0–46.0)
Hemoglobin: 13.1 g/dL (ref 12.0–15.0)
LYMPHS ABS: 2.4 10*3/uL (ref 0.7–4.0)
LYMPHS PCT: 27 %
MCH: 30.8 pg (ref 26.0–34.0)
MCHC: 33.4 g/dL (ref 30.0–36.0)
MCV: 92 fL (ref 78.0–100.0)
MONO ABS: 0.6 10*3/uL (ref 0.1–1.0)
MONOS PCT: 7 %
Neutro Abs: 5.1 10*3/uL (ref 1.7–7.7)
Neutrophils Relative %: 57 %
PLATELETS: 256 10*3/uL (ref 150–400)
RBC: 4.26 MIL/uL (ref 3.87–5.11)
RDW: 12.5 % (ref 11.5–15.5)
WBC: 9 10*3/uL (ref 4.0–10.5)

## 2016-04-05 LAB — COMPREHENSIVE METABOLIC PANEL
ALT: 15 U/L (ref 14–54)
AST: 25 U/L (ref 15–41)
Albumin: 3.5 g/dL (ref 3.5–5.0)
Alkaline Phosphatase: 61 U/L (ref 38–126)
Anion gap: 8 (ref 5–15)
BUN: 13 mg/dL (ref 6–20)
CO2: 27 mmol/L (ref 22–32)
Calcium: 9.3 mg/dL (ref 8.9–10.3)
Chloride: 103 mmol/L (ref 101–111)
Creatinine, Ser: 0.86 mg/dL (ref 0.44–1.00)
GFR calc Af Amer: >60 mL/min (ref >60)
GFR calc non Af Amer: >60 mL/min (ref >60)
GLUCOSE: 141 mg/dL — AB (ref 65–99)
POTASSIUM: 4.1 mmol/L (ref 3.5–5.1)
SODIUM: 138 mmol/L (ref 135–145)
Total Bilirubin: 0.5 mg/dL (ref 0.3–1.2)
Total Protein: 6.2 g/dL — ABNORMAL LOW (ref 6.5–8.1)

## 2016-04-05 LAB — BRAIN NATRIURETIC PEPTIDE: B Natriuretic Peptide: 65.5 pg/mL (ref 0.0–100.0)

## 2016-04-05 LAB — I-STAT TROPONIN, ED
Troponin i, poc: 0.01 ng/mL (ref 0.00–0.08)
Troponin i, poc: 0 ng/mL (ref 0.00–0.08)

## 2016-04-05 LAB — LIPASE, BLOOD: LIPASE: 30 U/L (ref 11–51)

## 2016-04-05 MED ORDER — ONDANSETRON 4 MG PO TBDP: 4.0000 mg | ORAL_TABLET | Freq: Three times a day (TID) | ORAL | 0 refills | Status: DC | PRN

## 2016-04-05 MED ORDER — RANITIDINE HCL 150 MG PO TABS: 150.0000 mg | ORAL_TABLET | Freq: Two times a day (BID) | ORAL | 0 refills | Status: DC

## 2016-04-05 MED ORDER — MELOXICAM 15 MG PO TABS: 15.0000 mg | ORAL_TABLET | Freq: Every day | ORAL | 0 refills | Status: DC

## 2016-04-05 MED ORDER — GI COCKTAIL ~~LOC~~
30.0000 mL | Freq: Once | ORAL | Status: AC
  Administered 2016-04-05: 30 mL via ORAL
  Filled 2016-04-05: qty 30

## 2016-04-05 MED ORDER — FAMOTIDINE IN NACL 20-0.9 MG/50ML-% IV SOLN
20.0000 mg | Freq: Once | INTRAVENOUS | Status: AC
  Administered 2016-04-05: 20 mg via INTRAVENOUS
  Filled 2016-04-05: qty 50

## 2016-04-05 MED ORDER — NITROGLYCERIN 0.4 MG SL SUBL: 0.4000 mg | SUBLINGUAL_TABLET | SUBLINGUAL | Status: DC | PRN

## 2016-04-05 MED ORDER — MORPHINE SULFATE (PF) 4 MG/ML IV SOLN
4.0000 mg | Freq: Once | INTRAVENOUS | Status: AC
Start: 2016-04-05 — End: 2016-04-05
  Administered 2016-04-05: 4 mg via INTRAVENOUS
  Filled 2016-04-05: qty 1

## 2016-04-05 NOTE — Discharge Instructions (Signed)
Your pain is likely from gastritis or an ulcer. You will need to start taking zantac as directed, continue taking omeprazole as directed by your doctor, and avoid spicy/fatty/acidic foods, avoid soda/coffee/tea/alcohol. Avoid laying down flat within 30 minutes of eating. Avoid NSAIDs like ibuprofen/aleve/motrin/etc, and use meloxicam instead but always take it on a FULL stomach, never on an empty stomach. May consider using over the counter tums/maalox as needed for additional relief. Use zofran as directed as needed for nausea. Use tylenol as needed for additional pain relief. Follow up with your cardiologist the next 3-5 days for recheck of symptoms. Return to the ER for changes or worsening symptoms.

## 2016-04-05 NOTE — ED Provider Notes (Signed)
[facility] DEPT Provider Note   CSN: [id number] Arrival date & time: 04/05/16  1513     History   Chief Complaint Chief Complaint  Patient presents with  . Chest Pain    HPI Mireyah Albertini is a 70 y.o. female with a PMHx of WPW s/p AICD placement and ablation attempt (no inducible arrhythmia), nonischemic cardiomyopathy, CHF, HTN, anxiety, GERD, asthma, and migraines, and PSHx of cholecystectomy, who presents to the ED with complaints of sudden onset epigastric/central chest pain that began approximately 1.5 hours prior to arrival. Patient states that yesterday she had some indigestion which felt somewhat similar but much more mild, and resolved on its own. She ate some toll house crackers this afternoon and shortly after, while sitting at rest, she developed severe chest pain. She thought that it might be indigestion, but it was more severe than yesterday so she called her cardiologist's office and was told to call 911. She describes the pain as 8/10 constant squeezing in the epigastric area and center of her chest near her epigastrium, radiating to underneath the left breast and into her upper back, worse with deep inspiration, and mildly improved with aspirin and Zofran given in route. Associated symptoms include shortness breath and nausea. She states that she feels like if she could "just throw up" that she might feel better. She has a Banker in place. Of note, she states she's been taking quite a bit of aleve lately because she ran out of her mobic. Takes prilosec daily, but is not on any other antacids.  She denies lightheadedness, diaphoresis, fevers, chills, cough, hemoptysis, LE swelling, recent travel/surgery/immobilization, estrogen use, personal hx of DVT/PE, V/D/C, hematuria, dysuria, myalgias, arthralgias, orthopnea, claudication, numbness, tingling, weakness, or any other complaints at this time. Nonsmoker. +FHx of early cardiac death in several uncles in their  26s; MI in MGF in his 47s, and mom in her 26s (didn't die of MI). +FHx of DVTs in mother and brother. Cardiologist is Dr. Irish Lack at [facility]. PCP Dr. Antony Blackbird.    The history is provided by the patient and medical records. No language interpreter was used.  Chest Pain   This is a recurrent problem. The current episode started 1 to 2 hours ago. The problem occurs constantly. The problem has not changed since onset.The pain is associated with rest. The pain is present in the substernal region. The pain is at a severity of 8/10. The pain is moderate. Quality: squeezing. The pain radiates to the upper back and precordial region. Duration of episode(s) is 2 hours. The symptoms are aggravated by deep breathing. Associated symptoms include abdominal pain (epigastric), nausea and shortness of breath. Pertinent negatives include no claudication, no cough, no diaphoresis, no fever, no hemoptysis, no lower extremity edema, no numbness, no orthopnea, no vomiting and no weakness. Treatments tried: ASA and zofran. The treatment provided mild relief. Risk factors include being elderly and obesity.  Her past medical history is significant for arrhythmia (WPW), CHF and hypertension.  Her family medical history is significant for early MI.  Pertinent negatives for family medical history include: no PE.    Past Medical History:  Diagnosis Date  . Anxiety   . Arthritis    "hands and knees" (03/22/2012)  . Asthma   . CHF (congestive heart failure) ([place]) 6/13  . Depression   . GERD (gastroesophageal reflux disease)   . Head injury, acute, with loss of consciousness ([place])   . Hypertension   . ICD (implantable cardiac defibrillator) in  place Dec 2013  . Migraines   . NICM (nonischemic cardiomyopathy) ([place])   . Obesity (BMI 30-39.9)    negative sleep study 2014  . Pacemaker   . Pneumonia    hx  . Shortness of breath    "@ any time I can get SOB" (03/22/2012)  . Skin cancer 1999  . WPW (Wolff-Parkinson-White  syndrome)     Patient Active Problem List   Diagnosis Date Noted  . Chronic systolic heart failure ([place]) 04/28/2015  . UTI (urinary tract infection) 03/22/2015  . Sepsis ([place]) 03/19/2015  . Acute kidney injury ([place]) 03/19/2015  . Diarrhea 03/18/2015  . CAP (community acquired pneumonia) 03/18/2015  . Nausea vomiting and diarrhea 03/18/2015  . Compression fracture 06/05/2014  . Essential hypertension 03/27/2014  . Chest pain 01/27/2014  . Normal coronary arteries 01/27/2014  . Obesity (BMI 30-39.9)   . ICD- MDT implanted Dec 2013 03/26/2013  . NICM (nonischemic cardiomyopathy) ([place]) 02/21/2013  . GERD (gastroesophageal reflux disease) 04/23/2012  . Asthma exacerbation 04/23/2012  . Shortness of breath 04/23/2012  . Syncope 03/01/2012  . Acute on chronic systolic CHF (congestive heart failure) ([place]) 03/01/2012  . Type B WPW syndrome- RFA Dec 2013 03/01/2012    Past Surgical History:  Procedure Laterality Date  . CARDIAC CATHETERIZATION  09/18/11   no significant CAD  . CARDIAC DEFIBRILLATOR PLACEMENT  03/22/2012   DDD  . CHOLECYSTECTOMY  ~ 2003  . IMPLANTABLE CARDIOVERTER DEFIBRILLATOR IMPLANT N/A 03/22/2012   Procedure: IMPLANTABLE CARDIOVERTER DEFIBRILLATOR IMPLANT;  Surgeon: Evans Lance, MD;  Location: [facility] C[facility] LAB;  Service: Cardiovascular;  Laterality: N/A;  . KYPHOPLASTY N/A 11/27/2013   Procedure: KYPHOPLASTY;  Surgeon: Sinclair Ship, MD;  Location: [place];  Service: Orthopedics;  Laterality: N/A;  T9, T11 kyphoplasty  . KYPHOPLASTY N/A 06/05/2014   Procedure: KYPHOPLASTY;  Surgeon: Sinclair Ship, MD;  Location: [place];  Service: Orthopedics;  Laterality: N/A;  T7 kyphoplasty  . MOHS SURGERY  06/2011   "forehead" (03/22/2012)  . SKIN CANCER EXCISION  1999   "top of my head and my right back shoulder"  . SKIN CANCER EXCISION     back  . SKIN CANCER EXCISION     face  . SUPRAVENTRICULAR TACHYCARDIA ABLATION  03/22/2012   unable to induce VT/RN report  03/22/2012  . SUPRAVENTRICULAR TACHYCARDIA ABLATION N/A 03/22/2012   Procedure: SUPRAVENTRICULAR TACHYCARDIA ABLATION;  Surgeon: Evans Lance, MD;  Location: [facility] CATH LAB;  Service: Cardiovascular;  Laterality: N/A;  . TONSILLECTOMY AND ADENOIDECTOMY  1950  . TUBAL LIGATION  1978    OB History    No data available       Home Medications    Prior to Admission medications   Medication Sig Start Date End Date Taking? Authorizing Provider  albuterol (PROVENTIL HFA;VENTOLIN HFA) 108 (90 BASE) MCG/ACT inhaler Inhale 2 puffs into the lungs every 6 (six) hours as needed. For wheezing    Historical Provider, MD  alendronate (FOSAMAX) 70 MG tablet Take 70 mg by mouth once a week. Take with a full glass of water on an empty stomach.    Historical Provider, MD  carvedilol (COREG) 12.5 MG tablet Take 1 tablet (12.5 mg total) by mouth 2 (two) times daily with a meal. 04/28/15   Evans Lance, MD  cetirizine (ZYRTEC) 10 MG tablet Take 10 mg by mouth daily.    Historical Provider, MD  Cholecalciferol (VITAMIN D3) 1000 UNITS CAPS Take 1,000 Units by mouth daily.  Historical Provider, MD  citalopram (CELEXA) 20 MG tablet Take 20 mg by mouth as directed.    Historical Provider, MD  furosemide (LASIX) 40 MG tablet Take 1 to 2 tablets daily as directed for edema, SOB and/or weigh gain 11/25/15   Jettie Booze, MD  losartan (COZAAR) 25 MG tablet Take 25 mg by mouth 2 (two) times daily.    Historical Provider, MD  losartan (COZAAR) 25 MG tablet TAKE ONE TABLET BY MOUTH TWICE DAILY 02/05/16   Jettie Booze, MD  meloxicam (MOBIC) 15 MG tablet Take 15 mg by mouth daily.    Historical Provider, MD  omeprazole (PRILOSEC) 40 MG capsule Take 40 mg by mouth daily.    Historical Provider, MD  spironolactone (ALDACTONE) 25 MG tablet TAKE ONE TABLET BY MOUTH ONCE DAILY. 10/19/15   Jettie Booze, MD    Family History Family History  Problem Relation Age of Onset  . Congestive Heart Failure Brother    . Atrial fibrillation Brother   . Congestive Heart Failure Mother     died 73  . Kidney Stones Mother   . Deafness Mother   . Atrial fibrillation Mother   . Congestive Heart Failure Father   . Deafness Father   . Healthy Sister   . Healthy Sister   . Healthy Sister   . Tuberculosis Paternal Grandfather     Social History Social History  Substance Use Topics  . Smoking status: Never Smoker  . Smokeless tobacco: Never Used  . Alcohol use No     Allergies   Ivp dye [iodinated diagnostic agents]; Penicillins; Sulfonamide derivatives; Tessalon [benzonatate]; Hydrocodone-acetaminophen; Penicillin g; Rosuvastatin calcium; and Onion   Review of Systems Review of Systems  Constitutional: Negative for chills, diaphoresis and fever.  Respiratory: Positive for shortness of breath. Negative for cough and hemoptysis.   Cardiovascular: Positive for chest pain. Negative for orthopnea, claudication and leg swelling.  Gastrointestinal: Positive for abdominal pain (epigastric) and nausea. Negative for constipation, diarrhea and vomiting.  Genitourinary: Negative for dysuria and hematuria.  Musculoskeletal: Negative for arthralgias and myalgias.  Skin: Negative for color change.  Allergic/Immunologic: Negative for immunocompromised state.  Neurological: Negative for weakness, light-headedness and numbness.  Psychiatric/Behavioral: Negative for confusion.   10 Systems reviewed and are negative for acute change except as noted in the HPI.   Physical Exam Updated Vital Signs BP (!) 126/49 (BP Location: Right Arm)   Pulse 71   Temp 97.9 F (36.6 C) (Oral)   Resp 16   Ht 5\' 3"  (1.6 m)   Wt 94.3 kg   SpO2 98%   BMI 36.85 kg/m   Physical Exam  Constitutional: She is oriented to person, place, and time. Vital signs are normal. She appears well-developed and well-nourished.  Non-toxic appearance. No distress.  Afebrile, nontoxic, NAD except for anxious appearing  HENT:  Head:  Normocephalic and atraumatic.  Mouth/Throat: Oropharynx is clear and moist and mucous membranes are normal.  Eyes: Conjunctivae and EOM are normal. Right eye exhibits no discharge. Left eye exhibits no discharge.  Neck: Normal range of motion. Neck supple.  Cardiovascular: Normal rate, regular rhythm, normal heart sounds and intact distal pulses.  Exam reveals no gallop and no friction rub.   No murmur heard. RRR, nl s1/s2, no m/r/g, distal pulses intact, no pedal edema   Pulmonary/Chest: Effort normal and breath sounds normal. No respiratory distress. She has no decreased breath sounds. She has no wheezes. She has no rhonchi. She has no rales.  She exhibits tenderness. She exhibits no crepitus, no deformity and no retraction.    CTAB in all lung fields, no w/r/r, no hypoxia or increased WOB, speaking in full sentences, SpO2 98% on RA Chest wall with mild TTP over lower sternum towards epigastrum, and L anterior chest under breast, without crepitus, deformities, or retractions   Abdominal: Soft. Normal appearance and bowel sounds are normal. She exhibits no distension. There is tenderness in the right upper quadrant, epigastric area and left upper quadrant. There is no rigidity, no rebound, no guarding, no CVA tenderness and no tenderness at McBurney's point.    Soft, obese but without obvious distension, +BS throughout, with moderate epigastric and upper abd TTP in both upper quadrants, no r/g/r, neg mcburney's, no CVA TTP   Musculoskeletal: Normal range of motion.  MAE x4 Strength and sensation grossly intact in all extremities Distal pulses intact No pedal edema, neg homan's bilaterally   Neurological: She is alert and oriented to person, place, and time. She has normal strength. No sensory deficit.  Skin: Skin is warm, dry and intact. No rash noted.  Psychiatric: Her mood appears anxious.  Slightly anxious appearing  Nursing note and vitals reviewed.    ED Treatments / Results   Labs (all labs ordered are listed, but only abnormal results are displayed) Labs Reviewed  CBC WITH DIFFERENTIAL/PLATELET - Abnormal; Notable for the following:       Result Value   Eosinophils Absolute 0.8 (*)    All other components within normal limits  COMPREHENSIVE METABOLIC PANEL - Abnormal; Notable for the following:    Glucose, Bld 141 (*)    Total Protein 6.2 (*)    All other components within normal limits  BRAIN NATRIURETIC PEPTIDE  LIPASE, BLOOD  I-STAT TROPOININ, ED  I-STAT TROPOININ, ED    EKG  EKG Interpretation  Date/Time:  Tuesday April 05 2016 15:14:04 EST Ventricular Rate:  73 PR Interval:    QRS Duration: 144 QT Interval:  456 QTC Calculation: 503 R Axis:   -31 Text Interpretation:  Pacemaker spikes or artifacts Sinus rhythm Short PR interval Left bundle branch block No significant change since last tracing Confirmed by ISAACS MD, Lysbeth Galas [id number]) on 04/05/2016 4:46:46 PM       Radiology Dg Chest 2 View  Result Date: 04/05/2016 CLINICAL DATA:  Mid sternal chest pain and shortness of breath since early afternoon, history asthma, CHF, hypertension, skin cancer EXAM: CHEST  2 VIEW COMPARISON:  04/03/2015 FINDINGS: LEFT subclavian transvenous AICD leads project at RIGHT atrium and RIGHT ventricle, unchanged. Enlargement of cardiac silhouette. Mediastinal contours and pulmonary vascularity normal. Minimal RIGHT basilar atelectasis. Lungs otherwise clear. No pleural effusion or pneumothorax. Bones demineralized. Prior spinal augmentation procedures at 3 levels of the thoracic spine. IMPRESSION: Enlargement of cardiac silhouette post AICD. Minimal RIGHT basilar atelectasis. Electronically Signed   By: Lavonia Dana M.D.   On: 04/05/2016 16:24     Echo 10/21/2014: Study Conclusions - Left ventricle: The cavity size was mildly dilated. Wall   thickness was normal. Systolic function was severely reduced. The   estimated ejection fraction was in the range of 20% to  25%.   Diffuse hypokinesis. There is akinesis of the anteroseptal   myocardium. Doppler parameters are consistent with restrictive   physiology, indicative of decreased left ventricular diastolic   compliance and/or increased left atrial pressure. - Atrial septum: There was an atrial septal aneurysm. - Pulmonary arteries: PA peak pressure: 33 mm Hg (S).  Impressions: - Global  hypokinesis with anteroseptal akinesis; overall severely   reduced LV function; restrictive filling; trace TR; pacer wire in   RA/RV.  Procedures Procedures (including critical care time)  Medications Ordered in ED Medications  gi cocktail (Maalox,Lidocaine,Donnatal) (30 mLs Oral Given 04/05/16 1603)  morphine 4 MG/ML injection 4 mg (4 mg Intravenous Given 04/05/16 1603)  famotidine (PEPCID) IVPB 20 mg premix (0 mg Intravenous Stopped 04/05/16 1718)     Initial Impression / Assessment and Plan / ED Course  I have reviewed the triage vital signs and the nursing notes.  Pertinent labs & imaging results that were available during my care of the patient were reviewed by me and considered in my medical decision making (see chart for details).  Clinical Course     70 y.o. female here with epigastric abd/chest pain radiating to back and under L breast, had some indigestion yesterday that felt similar but resolved, and then this started 1.5hrs PTA and was much worse. Associated sxs of nausea and SOB. Given ASA and zofran PTA and that helped some. On exam, appears slightly anxious, no tachycardia or hypoxia, no LE swelling, moderate epigastric and upper abd TTP and reproducible tenderness over sternum and L lower anterior chest. Seems mostly like GI, but given cardiac history will proceed with trop, EKG, CXR, CBC w/diff, CMP, lipase, and give pepcid, GI cocktail, and morphine; if unrelieved then will try NTG. Doubt PE/dissection, seems most consistent with GERD/GI related pain especially given her recent NSAID use. Will  reassess shortly. Discussed case with my attending Dr. Ellender Hose who agrees with plan.   6:10 PM CBC w/diff unremarkable. CMP WNL aside from glucose 141. Lipase WNL. BNP unremarkable. Trop neg at 4pm (~2hrs post onset). EKG unchanged from prior and without acute ischemic findings. CXR with mild cardiomegaly and R basilar atelectasis but otherwise unremarkable. Pt feeling much better after pepcid, GI cocktail, and morphine, reports nausea and pain have subsided. [employer] interrogation done and state no shocks have been delivered, no events reported, full report without any acute events. Seems most likely to be GERD related, but given her moderate HEART score and family/personal cardiac history, will get delta trop at 7pm (~5hrs post onset). Will likely then d/c home with zofran and zantac rx's, and refill of mobic rx. Will reassess shortly.  7:43 PM  Pt continues feeling improved. Second trop neg. Likely GERD/gastritis. Discussed diet/lifestyle modifications for GERD/indigestion. Start zantac in addition to prilosec, and rx zofran and refill mobic rx. F/up with cardiologist in 3-5 days for recheck of symptoms. I explained the diagnosis and have given explicit precautions to return to the ER including for any other new or worsening symptoms. The patient understands and accepts the medical plan as it's been dictated and I have answered their questions. Discharge instructions concerning home care and prescriptions have been given. The patient is STABLE and is discharged to home in good condition.   Final Clinical Impressions(s) / ED Diagnoses   Final diagnoses:  Atypical chest pain  Epigastric abdominal pain  Gastritis, presence of bleeding unspecified, unspecified chronicity, unspecified gastritis type  Gastroesophageal reflux disease, esophagitis presence not specified  Nausea  SOB (shortness of breath)    New Prescriptions New Prescriptions   MELOXICAM (MOBIC) 15 MG TABLET    Take 1 tablet (15 mg  total) by mouth daily. TAKE WITH MEALS   ONDANSETRON (ZOFRAN ODT) 4 MG DISINTEGRATING TABLET    Take 1 tablet (4 mg total) by mouth every 8 (eight) hours as needed for nausea  or vomiting.   RANITIDINE (ZANTAC) 150 MG TABLET    Take 1 tablet (150 mg total) by mouth 2 (two) times daily.     [address] [place], PA-C 04/05/16 1943    Duffy Bruce, MD 04/06/16 1152

## 2016-04-05 NOTE — Telephone Encounter (Signed)
Received call transferred from operator and spoke with pt. It was difficult to hear her on phone. She sounds short of breath and reports she is hurting in her chest.  I told pt she needs to call 911.  She said something else but I could not hear her. She then said "OK, bye" and hung up.

## 2016-04-05 NOTE — Telephone Encounter (Signed)
New Message   Pt c/o of Chest Pain: STAT if CP now or developed within 24 hours  1. Are you having CP right now? Yes  2. Are you experiencing any other symptoms (ex. SOB, nausea, vomiting, sweating)? No  3. How long have you been experiencing CP?   4. Is your CP continuous or coming and going? Continuous  5. Have you taken Nitroglycerin? n/a ?

## 2016-04-05 NOTE — ED Triage Notes (Signed)
Pt brought in by EMS due to having left sided chest pain that radiates to left breast area. Pt has defib/PPM. Pt does endorse SOB and nausea. Pt received 324mg  aspirin and 4mg  zofran. Pt a&ox4.

## 2016-04-05 NOTE — ED Notes (Signed)
[employer] called report and no shocks have been delivered from defib/PPM. [employer] will fax report.

## 2016-04-05 NOTE — ED Notes (Signed)
This RN interrogated the pts ICD pacemaker

## 2016-04-18 ENCOUNTER — Ambulatory Visit (INDEPENDENT_AMBULATORY_CARE_PROVIDER_SITE_OTHER): Payer: Medicare HMO

## 2016-04-18 DIAGNOSIS — Z95810 Presence of automatic (implantable) cardiac defibrillator: Secondary | ICD-10-CM | POA: Diagnosis not present

## 2016-04-18 DIAGNOSIS — I5022 Chronic systolic (congestive) heart failure: Secondary | ICD-10-CM | POA: Diagnosis not present

## 2016-04-18 NOTE — Progress Notes (Signed)
EPIC Encounter for ICM Monitoring  Patient Name: Paula Booker is a 71 y.o. female Date: 04/18/2016 Primary Care Physican: Antony Blackbird, MD Primary [place] Electrophysiologist: Druscilla Brownie Weight:unknown       Heart Failure questions reviewed, pt asymptomatic.  She did have an ER visit on 04/05/2017 due to chest pain but was told it was GI related.  GI medication has resolved the pain.   Thoracic impedance abnormal suggesting fluid accumulation for the last couple of days.  Recommendations:  No changes.  Reinforced to limit low salt food choices to 2000 mg day and limiting fluid intake to < 2 liters per day. Encouraged to call for fluid symptoms.    Follow-up plan: ICM clinic phone appointment on 05/19/2016.   Office appointment with Dr Lovena Le on 05/04/2016  Copy of ICM check sent to primary cardiologist and device physician.   3 month ICM trend: 04/18/2016   1 Year ICM trend:      [name], RN 04/18/2016 11:07 AM

## 2016-04-24 ENCOUNTER — Other Ambulatory Visit: Payer: Self-pay | Admitting: Interventional Cardiology

## 2016-04-26 NOTE — Telephone Encounter (Signed)
Rx refill sent to pharmacy. 

## 2016-05-04 ENCOUNTER — Encounter: Payer: Self-pay | Admitting: Internal Medicine

## 2016-05-04 ENCOUNTER — Ambulatory Visit (INDEPENDENT_AMBULATORY_CARE_PROVIDER_SITE_OTHER): Payer: Medicare HMO | Admitting: Internal Medicine

## 2016-05-04 VITALS — BP 130/82 | HR 70 | Ht 63.5 in | Wt 210.4 lb

## 2016-05-04 DIAGNOSIS — I5022 Chronic systolic (congestive) heart failure: Secondary | ICD-10-CM

## 2016-05-04 DIAGNOSIS — Z95810 Presence of automatic (implantable) cardiac defibrillator: Secondary | ICD-10-CM

## 2016-05-04 DIAGNOSIS — I428 Other cardiomyopathies: Secondary | ICD-10-CM

## 2016-05-04 LAB — CUP PACEART INCLINIC DEVICE CHECK
Battery Voltage: 3 V
Brady Statistic AS VP Percent: 0.03 %
Brady Statistic RA Percent Paced: 8.65 %
Brady Statistic RV Percent Paced: 0.05 %
Date Time Interrogation Session: 20180124172637
HighPow Impedance: 84 Ohm
Implantable Lead Implant Date: 20131212
Implantable Lead Model: 5076
Implantable Pulse Generator Implant Date: 20131212
Lead Channel Impedance Value: 836 Ohm
Lead Channel Pacing Threshold Amplitude: 0.5 V
Lead Channel Pacing Threshold Pulse Width: 0.4 ms
Lead Channel Sensing Intrinsic Amplitude: 11.5 mV
Lead Channel Setting Pacing Amplitude: 2.5 V
Lead Channel Setting Sensing Sensitivity: 0.3 mV
MDC IDC LEAD IMPLANT DT: 20131212
MDC IDC LEAD LOCATION: 753859
MDC IDC LEAD LOCATION: 753860
MDC IDC MSMT BATTERY REMAINING LONGEVITY: 82 mo
MDC IDC MSMT LEADCHNL RA IMPEDANCE VALUE: 475 Ohm
MDC IDC MSMT LEADCHNL RA SENSING INTR AMPL: 4.25 mV
MDC IDC MSMT LEADCHNL RV IMPEDANCE VALUE: 703 Ohm
MDC IDC MSMT LEADCHNL RV PACING THRESHOLD AMPLITUDE: 0.5 V
MDC IDC MSMT LEADCHNL RV PACING THRESHOLD PULSEWIDTH: 0.4 ms
MDC IDC SET LEADCHNL RA PACING AMPLITUDE: 2 V
MDC IDC SET LEADCHNL RV PACING PULSEWIDTH: 0.4 ms
MDC IDC STAT BRADY AP VP PERCENT: 0.02 %
MDC IDC STAT BRADY AP VS PERCENT: 8.89 %
MDC IDC STAT BRADY AS VS PERCENT: 91.05 %

## 2016-05-04 NOTE — Progress Notes (Signed)
HPI Paula Booker returns today for followup. She is a pleasant 71 yo woman with a non-ischemic CM, chronic systolic heart failure, syncope and a WPW pattern on her ECG who underwent EP study with no inducible arrhythmias followed by ICD implant 2 years ago. She has become more sedentary. She denies any ICD shocks.  No other complaints. No peripheral edema. She remains active. She is renovating her house. Allergies  Allergen Reactions  . Ivp Dye [Iodinated Diagnostic Agents] Anaphylaxis, Hives, Swelling and Other (See Comments)    Throat swell  . Penicillins Anaphylaxis    Has patient had a PCN reaction causing immediate rash, facial/tongue/throat swelling, SOB or lightheadedness with hypotension: No Has patient had a PCN reaction causing severe rash involving mucus membranes or skin necrosis: No Has patient had a PCN reaction that required hospitalization No Has patient had a PCN reaction occurring within the last 10 years:No If all of the above answers are "NO", then may proceed with Cephalosporin use.  . Sulfonamide Derivatives Other (See Comments)    Migraines  . Tessalon [Benzonatate] Rash and Hives    Severe rash  . Hydrocodone-Acetaminophen Other (See Comments)  . Penicillin G Other (See Comments)  . Rosuvastatin Calcium Other (See Comments)  . Onion Other (See Comments)    Raw onions cause migraines     Current Outpatient Prescriptions  Medication Sig Dispense Refill  . albuterol (PROVENTIL HFA;VENTOLIN HFA) 108 (90 BASE) MCG/ACT inhaler Inhale 2 puffs into the lungs every 6 (six) hours as needed. For wheezing    . alendronate (FOSAMAX) 70 MG tablet Take 70 mg by mouth once a week. Take with a full glass of water on an empty stomach.    . carvedilol (COREG) 12.5 MG tablet Take 1 tablet (12.5 mg total) by mouth 2 (two) times daily with a meal. 180 tablet 3  . cetirizine (ZYRTEC) 10 MG tablet Take 10 mg by mouth daily.    . Cholecalciferol (VITAMIN D3) 1000 UNITS CAPS Take  1,000 Units by mouth daily.     . citalopram (CELEXA) 20 MG tablet Take 20 mg by mouth daily.     . furosemide (LASIX) 40 MG tablet Take 1 to 2 tablets daily as directed for edema, SOB and/or weigh gain 180 tablet 3  . Gelatin 600 MG CAPS Take 1,300 mg by mouth 2 (two) times daily.    Marland Kitchen losartan (COZAAR) 25 MG tablet Take 25 mg by mouth 2 (two) times daily.    . meloxicam (MOBIC) 15 MG tablet Take 15 mg by mouth daily.    Marland Kitchen omeprazole (PRILOSEC) 40 MG capsule Take 20 mg by mouth daily.     . ondansetron (ZOFRAN ODT) 4 MG disintegrating tablet Take 1 tablet (4 mg total) by mouth every 8 (eight) hours as needed for nausea or vomiting. 15 tablet 0  . ranitidine (ZANTAC) 150 MG tablet Take 1 tablet (150 mg total) by mouth 2 (two) times daily. 30 tablet 0  . spironolactone (ALDACTONE) 25 MG tablet TAKE ONE TABLET BY MOUTH ONCE DAILY 90 tablet 2   No current facility-administered medications for this visit.      Past Medical History:  Diagnosis Date  . Anxiety   . Arthritis    "hands and knees" (03/22/2012)  . Asthma   . CHF (congestive heart failure) ([employer]) 6/13  . Depression   . GERD (gastroesophageal reflux disease)   . Head injury, acute, with loss of consciousness ([place])   . Hypertension   .  ICD (implantable cardiac defibrillator) in place Dec 2013  . Migraines   . NICM (nonischemic cardiomyopathy) ([place])   . Obesity (BMI 30-39.9)    negative sleep study 2014  . Pacemaker   . Pneumonia    hx  . Shortness of breath    "@ any time I can get SOB" (03/22/2012)  . Skin cancer 1999  . WPW (Wolff-Parkinson-White syndrome)     ROS:   All systems reviewed and negative except as noted in the HPI.   Past Surgical History:  Procedure Laterality Date  . CARDIAC CATHETERIZATION  09/18/11   no significant CAD  . CARDIAC DEFIBRILLATOR PLACEMENT  03/22/2012   DDD  . CHOLECYSTECTOMY  ~ 2003  . IMPLANTABLE CARDIOVERTER DEFIBRILLATOR IMPLANT N/A 03/22/2012   Procedure: IMPLANTABLE  CARDIOVERTER DEFIBRILLATOR IMPLANT;  Surgeon: Evans Lance, MD;  Location[facility] C[facility] LAB;  Service: Cardiovascular;  Laterality: N/A;  . KYPHOPLASTY N/A 11/27/2013   Procedure: KYPHOPLASTY;  Surgeon: Sinclair Ship, MD;  Location: [place];  Service: Orthopedics;  Laterality: N/A;  T9, T11 kyphoplasty  . KYPHOPLASTY N/A 06/05/2014   Procedure: KYPHOPLASTY;  Surgeon: Sinclair Ship, MD;  Location: [place];  Service: Orthopedics;  Laterality: N/A;  T7 kyphoplasty  . MOHS SURGERY  06/2011   "forehead" (03/22/2012)  . SKIN CANCER EXCISION  1999   "top of my head and my right back shoulder"  . SKIN CANCER EXCISION     back  . SKIN CANCER EXCISION     face  . SUPRAVENTRICULAR TACHYCARDIA ABLATION  03/22/2012   unable to induce VT/RN report 03/22/2012  . SUPRAVENTRICULAR TACHYCARDIA ABLATION N/A 03/22/2012   Procedure: SUPRAVENTRICULAR TACHYCARDIA ABLATION;  Surgeon: Evans Lance, MD;  Location: [facility] CATH LAB;  Service: Cardiovascular;  Laterality: N/A;  . TONSILLECTOMY AND ADENOIDECTOMY  1950  . TUBAL LIGATION  1978     Family History  Problem Relation Age of Onset  . Congestive Heart Failure Brother   . Atrial fibrillation Brother   . Congestive Heart Failure Mother     died 74  . Kidney Stones Mother   . Deafness Mother   . Atrial fibrillation Mother   . Congestive Heart Failure Father   . Deafness Father   . Healthy Sister   . Healthy Sister   . Healthy Sister   . Tuberculosis Paternal Grandfather      Social History   Social History  . Marital status: Divorced    Spouse name: N/A  . Number of children: 3  . Years of education: N/A   Occupational History  . retired     Optometrist   Social History Main Topics  . Smoking status: Never Smoker  . Smokeless tobacco: Never Used  . Alcohol use No  . Drug use: No  . Sexual activity: Not on file   Other Topics Concern  . Not on file   Social History Narrative   Divorced, 3 children, 9 grandchildren     BP  130/82   Pulse 70   Ht 5' 3.5" (1.613 m)   Wt 210 lb 6.4 oz (95.4 kg)   BMI 36.69 kg/m   Physical Exam:  Stable appearing 71 yo woman, NAD HEENT: Unremarkable Neck:  No JVD, no thyromegally, no bruits Back:  No CVA tenderness Lungs:  Clear with no wheezes HEART:  Regular rate rhythm, no murmurs, no rubs, no clicks Abd:  soft, obese, positive bowel sounds, no organomegally, no rebound, no guarding Ext:  2 plus pulses,  no edema, no cyanosis, no clubbing Skin:  No rashes no nodules Neuro:  CN II through XII intact, motor grossly intact   DEVICE  Normal device function.  See PaceArt for details.   Assess/Plan: 1. Chronic systolic heart failure - her symptoms remain class 2. She will continue her current meds. 2. Obesity - we discussed the importance of weight loss. I have asked her to lose a pound a month. 3. ICD - her Medtronic device is working normally. Will recheck in several months.  Mikle Bosworth.D.

## 2016-05-04 NOTE — Patient Instructions (Signed)
Medication Instructions:  Your physician recommends that you continue on your current medications as directed. Please refer to the Current Medication list given to you today.   Labwork: None Ordered   Testing/Procedures: None Ordered   Follow-Up: Your physician wants you to follow-up in: 1 year with Dr. Lovena Le. You will receive a reminder letter in the mail two months in advance. If you don't receive a letter, please call our office to schedule the follow-up appointment.  Remote monitoring is used to monitor your ICD from home. This monitoring reduces the number of office visits required to check your device to one time per year. It allows [place] to keep an eye on the functioning of your device to ensure it is working properly. You are scheduled for a device check from home on 08/03/16. You may send your transmission at any time that day. If you have a wireless device, the transmission will be sent automatically. After your physician reviews your transmission, you will receive a postcard with your next transmission date.    Any Other Special Instructions Will Be Listed Below (If Applicable).     If you need a refill on your cardiac medications before your next appointment, please call your pharmacy.

## 2016-05-18 DIAGNOSIS — M1811 Unilateral primary osteoarthritis of first carpometacarpal joint, right hand: Secondary | ICD-10-CM | POA: Diagnosis not present

## 2016-05-18 DIAGNOSIS — G5601 Carpal tunnel syndrome, right upper limb: Secondary | ICD-10-CM | POA: Diagnosis not present

## 2016-05-18 DIAGNOSIS — G5602 Carpal tunnel syndrome, left upper limb: Secondary | ICD-10-CM | POA: Diagnosis not present

## 2016-05-19 ENCOUNTER — Ambulatory Visit (INDEPENDENT_AMBULATORY_CARE_PROVIDER_SITE_OTHER): Payer: Medicare HMO

## 2016-05-19 DIAGNOSIS — Z95810 Presence of automatic (implantable) cardiac defibrillator: Secondary | ICD-10-CM

## 2016-05-19 DIAGNOSIS — I5022 Chronic systolic (congestive) heart failure: Secondary | ICD-10-CM

## 2016-05-19 DIAGNOSIS — I428 Other cardiomyopathies: Secondary | ICD-10-CM

## 2016-05-20 NOTE — Progress Notes (Signed)
EPIC Encounter for ICM Monitoring  Patient Name: Paula Booker is a 71 y.o. female Date: 05/20/2016 Primary Care Physican: Cammy Copa, MD Primary [place] Electrophysiologist: Druscilla Brownie Weight:unknown          Heart Failure questions reviewed, pt asymptomatic   Thoracic impedance normal today.  Recommendations:  No changes. Reminded to limit dietary salt intake to 2000 mg/day and fluid intake to < 2 liters/day. Encouraged to call for fluid symptoms.  Follow-up plan: ICM clinic phone appointment on 06/20/2016.  Copy of ICM check sent to device physician.   3 month ICM trend: 05/19/2016   1 Year ICM trend:      [name], RN 05/20/2016 10:26 AM

## 2016-05-31 ENCOUNTER — Other Ambulatory Visit: Payer: Self-pay | Admitting: Internal Medicine

## 2016-05-31 DIAGNOSIS — I428 Other cardiomyopathies: Secondary | ICD-10-CM

## 2016-06-14 DIAGNOSIS — M1711 Unilateral primary osteoarthritis, right knee: Secondary | ICD-10-CM | POA: Diagnosis not present

## 2016-06-14 DIAGNOSIS — M1712 Unilateral primary osteoarthritis, left knee: Secondary | ICD-10-CM | POA: Diagnosis not present

## 2016-06-20 ENCOUNTER — Ambulatory Visit (INDEPENDENT_AMBULATORY_CARE_PROVIDER_SITE_OTHER): Payer: Medicare HMO

## 2016-06-20 DIAGNOSIS — Z95810 Presence of automatic (implantable) cardiac defibrillator: Secondary | ICD-10-CM | POA: Diagnosis not present

## 2016-06-20 DIAGNOSIS — I5022 Chronic systolic (congestive) heart failure: Secondary | ICD-10-CM

## 2016-06-21 NOTE — Progress Notes (Signed)
EPIC Encounter for ICM Monitoring  Patient Name: Paula Booker is a 71 y.o. female Date: 06/21/2016 Primary Care Physican: Cammy Copa, MD Primary [place] Electrophysiologist: Druscilla Brownie Weight:unknown  Transmission reviewed   Thoracic impedance normal.  Prescribed dosage: Furosemide 40 mg 1 to 2 tablets daily as directed for edema, SOB and/or weigh gain  Recommendations: None  Follow-up plan: ICM clinic phone appointment on 08/03/2016.  Copy of ICM check sent to device physician.   3 month ICM trend: 06/20/2016   1 Year ICM trend:      [name], RN 06/21/2016 2:59 PM

## 2016-07-21 ENCOUNTER — Telehealth: Payer: Self-pay

## 2016-07-21 ENCOUNTER — Telehealth: Payer: Self-pay | Admitting: Cardiology

## 2016-07-21 ENCOUNTER — Ambulatory Visit (INDEPENDENT_AMBULATORY_CARE_PROVIDER_SITE_OTHER): Payer: Medicare HMO | Admitting: *Deleted

## 2016-07-21 DIAGNOSIS — I5022 Chronic systolic (congestive) heart failure: Secondary | ICD-10-CM | POA: Diagnosis not present

## 2016-07-21 DIAGNOSIS — I428 Other cardiomyopathies: Secondary | ICD-10-CM

## 2016-07-21 LAB — CUP PACEART INCLINIC DEVICE CHECK
Battery Remaining Longevity: 80 mo
Brady Statistic AP VS Percent: 10.34 %
Brady Statistic AS VP Percent: 0.03 %
Brady Statistic RA Percent Paced: 10.07 %
Brady Statistic RV Percent Paced: 0.05 %
HIGH POWER IMPEDANCE MEASURED VALUE: 95 Ohm
Implantable Lead Implant Date: 20131212
Implantable Lead Location: 753859
Implantable Lead Model: 5076
Implantable Lead Model: 6935
Lead Channel Impedance Value: 874 Ohm
Lead Channel Pacing Threshold Amplitude: 0.5 V
Lead Channel Sensing Intrinsic Amplitude: 15.25 mV
Lead Channel Sensing Intrinsic Amplitude: 4.375 mV
Lead Channel Sensing Intrinsic Amplitude: 4.375 mV
Lead Channel Setting Pacing Amplitude: 2.5 V
Lead Channel Setting Pacing Pulse Width: 0.4 ms
MDC IDC LEAD IMPLANT DT: 20131212
MDC IDC LEAD LOCATION: 753860
MDC IDC MSMT BATTERY VOLTAGE: 2.97 V
MDC IDC MSMT LEADCHNL RA IMPEDANCE VALUE: 532 Ohm
MDC IDC MSMT LEADCHNL RA PACING THRESHOLD AMPLITUDE: 0.5 V
MDC IDC MSMT LEADCHNL RA PACING THRESHOLD PULSEWIDTH: 0.4 ms
MDC IDC MSMT LEADCHNL RV IMPEDANCE VALUE: 665 Ohm
MDC IDC MSMT LEADCHNL RV PACING THRESHOLD PULSEWIDTH: 0.4 ms
MDC IDC MSMT LEADCHNL RV SENSING INTR AMPL: 14.875 mV
MDC IDC PG IMPLANT DT: 20131212
MDC IDC SESS DTM: 20180412103751
MDC IDC SET LEADCHNL RA PACING AMPLITUDE: 2 V
MDC IDC SET LEADCHNL RV SENSING SENSITIVITY: 0.3 mV
MDC IDC STAT BRADY AP VP PERCENT: 0.02 %
MDC IDC STAT BRADY AS VS PERCENT: 89.61 %

## 2016-07-21 NOTE — Telephone Encounter (Signed)
Pt called in stating that her device had beeped, informed pt that a transmission had come through and one of her values for her leads was out of range, asked pt if she could come into the office today. Pt stated that she didn't drive and would have to get someone to drive her and she would call back with a time she could come in the office.

## 2016-07-21 NOTE — Progress Notes (Signed)
Seen for RV bipolar lead impedance alert. Slight elevation noted to above 1000ohms. RV trend stable. RV Sensing and threshold WNL. Reviewed w/  WC increased RV lead impedance alert  to 1500ohms. Remote 08/03/2016. ROV w/ GT 04/2017

## 2016-07-21 NOTE — Telephone Encounter (Signed)
Pt called and stated that she can be here around 9:30 AM for an appt. Informed device tech RN of this and pt added to the schedule.

## 2016-08-03 ENCOUNTER — Ambulatory Visit (INDEPENDENT_AMBULATORY_CARE_PROVIDER_SITE_OTHER): Payer: Medicare HMO | Admitting: *Deleted

## 2016-08-03 DIAGNOSIS — I428 Other cardiomyopathies: Secondary | ICD-10-CM

## 2016-08-03 DIAGNOSIS — I5022 Chronic systolic (congestive) heart failure: Secondary | ICD-10-CM

## 2016-08-03 DIAGNOSIS — Z95810 Presence of automatic (implantable) cardiac defibrillator: Secondary | ICD-10-CM | POA: Diagnosis not present

## 2016-08-03 NOTE — Progress Notes (Signed)
Remote ICD transmission.   

## 2016-08-04 LAB — CUP PACEART REMOTE DEVICE CHECK
Battery Remaining Longevity: 78 mo
Brady Statistic AP VS Percent: 3.29 %
Brady Statistic AS VP Percent: 0.03 %
Date Time Interrogation Session: 20180425041807
HighPow Impedance: 90 Ohm
Implantable Lead Implant Date: 20131212
Implantable Lead Location: 753860
Implantable Lead Model: 5076
Implantable Lead Model: 6935
Lead Channel Impedance Value: 646 Ohm
Lead Channel Impedance Value: 817 Ohm
Lead Channel Pacing Threshold Amplitude: 0.5 V
Lead Channel Pacing Threshold Amplitude: 0.5 V
Lead Channel Sensing Intrinsic Amplitude: 4.25 mV
Lead Channel Sensing Intrinsic Amplitude: 4.25 mV
Lead Channel Setting Pacing Amplitude: 2.5 V
Lead Channel Setting Sensing Sensitivity: 0.3 mV
MDC IDC LEAD IMPLANT DT: 20131212
MDC IDC LEAD LOCATION: 753859
MDC IDC MSMT BATTERY VOLTAGE: 2.99 V
MDC IDC MSMT LEADCHNL RA IMPEDANCE VALUE: 513 Ohm
MDC IDC MSMT LEADCHNL RA PACING THRESHOLD PULSEWIDTH: 0.4 ms
MDC IDC MSMT LEADCHNL RV PACING THRESHOLD PULSEWIDTH: 0.4 ms
MDC IDC MSMT LEADCHNL RV SENSING INTR AMPL: 8.75 mV
MDC IDC MSMT LEADCHNL RV SENSING INTR AMPL: 8.75 mV
MDC IDC PG IMPLANT DT: 20131212
MDC IDC SET LEADCHNL RA PACING AMPLITUDE: 2 V
MDC IDC SET LEADCHNL RV PACING PULSEWIDTH: 0.4 ms
MDC IDC STAT BRADY AP VP PERCENT: 0.01 %
MDC IDC STAT BRADY AS VS PERCENT: 96.68 %
MDC IDC STAT BRADY RA PERCENT PACED: 3.27 %
MDC IDC STAT BRADY RV PERCENT PACED: 0.03 %

## 2016-08-04 NOTE — Progress Notes (Signed)
EPIC Encounter for ICM Monitoring  Patient Name: Paula Booker is a 71 y.o. female Date: 08/04/2016 Primary Care Physican: Cammy Copa, MD Primary [place] Electrophysiologist: Druscilla Brownie Weight:unknown     Heart Failure questions reviewed, pt has some ankle swelling   Thoracic impedance slightly abnormal suggesting fluid accumulation and she has taken extra Furosemide 40 mg tablet several days.  Prescribed dosage: Furosemide 40 mg 1 to 2 tablets daily as directed for edema, SOB and/or weigh gain  Recommendations: No changes. Discussed to limit salt intake to 2000 mg/day and fluid intake to < 2 liters/day.  Encouraged to call for fluid symptoms.  Follow-up plan: ICM clinic phone appointment on 09/06/2016.    Copy of ICM check sent to device physician.   3 month ICM trend: 08/03/2016   1 Year ICM trend:      [name], RN 08/04/2016 4:10 PM

## 2016-08-05 ENCOUNTER — Encounter: Payer: Self-pay | Admitting: Cardiology

## 2016-08-10 ENCOUNTER — Telehealth: Payer: Self-pay | Admitting: Cardiology

## 2016-08-10 NOTE — Telephone Encounter (Signed)
Transmission reviewed. No alerts on today's transmission. RV impedance alert out of range on 07/21/16. Will check device and reprogram alerts on 08/12/16. Pt reports hearing alert that sounds like a [place] ambulance.

## 2016-08-10 NOTE — Telephone Encounter (Signed)
Pt called and stated that she heard an alarm from her ICD. Pt is going to send a remote transmission in about 20 minutes. She stated that she feels fine just a little tired.

## 2016-08-12 ENCOUNTER — Telehealth: Payer: Self-pay | Admitting: Cardiology

## 2016-08-12 NOTE — Telephone Encounter (Signed)
Patient had to cancel her appointment for today 08/12/16--her car soul not start.  She wanted you to know  Her device has not made that funny noise anymore.

## 2016-08-16 NOTE — Telephone Encounter (Signed)
Noted  

## 2016-08-17 DIAGNOSIS — I456 Pre-excitation syndrome: Secondary | ICD-10-CM | POA: Diagnosis not present

## 2016-08-17 DIAGNOSIS — Z95810 Presence of automatic (implantable) cardiac defibrillator: Secondary | ICD-10-CM | POA: Diagnosis not present

## 2016-08-17 DIAGNOSIS — I10 Essential (primary) hypertension: Secondary | ICD-10-CM | POA: Diagnosis not present

## 2016-08-17 DIAGNOSIS — J4520 Mild intermittent asthma, uncomplicated: Secondary | ICD-10-CM | POA: Diagnosis not present

## 2016-08-17 DIAGNOSIS — I509 Heart failure, unspecified: Secondary | ICD-10-CM | POA: Diagnosis not present

## 2016-08-17 DIAGNOSIS — M4850XD Collapsed vertebra, not elsewhere classified, site unspecified, subsequent encounter for fracture with routine healing: Secondary | ICD-10-CM | POA: Diagnosis not present

## 2016-08-17 DIAGNOSIS — R0602 Shortness of breath: Secondary | ICD-10-CM | POA: Diagnosis not present

## 2016-08-19 ENCOUNTER — Encounter: Payer: Self-pay | Admitting: Cardiology

## 2016-09-06 ENCOUNTER — Ambulatory Visit (INDEPENDENT_AMBULATORY_CARE_PROVIDER_SITE_OTHER): Payer: Medicare HMO

## 2016-09-06 DIAGNOSIS — I5022 Chronic systolic (congestive) heart failure: Secondary | ICD-10-CM | POA: Diagnosis not present

## 2016-09-06 DIAGNOSIS — Z95810 Presence of automatic (implantable) cardiac defibrillator: Secondary | ICD-10-CM | POA: Diagnosis not present

## 2016-09-06 NOTE — Progress Notes (Signed)
EPIC Encounter for ICM Monitoring  Patient Name: Paula Booker is a 71 y.o. female Date: 09/06/2016 Primary Care Physican: [name], MD Primary [place] Electrophysiologist: Druscilla Brownie Weight:unknown                Attempted call to patient and unable to reach.  Left message to return call regarding transmission.  Transmission reviewed.    Thoracic impedance has been abnormal suggesting fluid accumulation since 08/01/2016 with exception of 3 days and has returned to normal today (09/06/2016).  Prescribed dosage Furosemide 40 mg 1 to 2 tablets daily as directed for edema, SOB and/or weigh gain  Recommendations: NONE - Unable to reach patient   Follow-up plan: ICM clinic phone appointment on 10/07/2016.    Copy of ICM check sent to primary cardiologist and device physician.   3 month ICM trend: 09/06/2016   1 Year ICM trend:  *    [name], RN 09/06/2016 10:19 AM

## 2016-09-08 NOTE — Progress Notes (Addendum)
Returned call to patient.  Reviewed transmission.  She reported she has some swelling of her feet and has taken extra Furosemide when needed.  She elevates her feet to help decrease the swelling. Advised to review the foods she is eating and limit salt to 2000 mg daily and fluid intake to 64 oz daily.  She said she has been following those limitations.  Next ICM remote transmission 10/07/2016.  Encouraged her to call if she develops any worsening fluid symptoms.

## 2016-09-20 ENCOUNTER — Ambulatory Visit (INDEPENDENT_AMBULATORY_CARE_PROVIDER_SITE_OTHER): Payer: Medicare HMO | Admitting: Pulmonary Disease

## 2016-09-20 ENCOUNTER — Encounter: Payer: Self-pay | Admitting: Pulmonary Disease

## 2016-09-20 ENCOUNTER — Ambulatory Visit (INDEPENDENT_AMBULATORY_CARE_PROVIDER_SITE_OTHER)
Admission: RE | Admit: 2016-09-20 | Discharge: 2016-09-20 | Disposition: A | Discharge status: Home / Self Care | Payer: Medicare HMO | Source: Ambulatory Visit | Attending: Pulmonary Disease | Admitting: Pulmonary Disease

## 2016-09-20 VITALS — BP 116/76 | HR 74 | Ht 63.5 in | Wt 209.0 lb

## 2016-09-20 DIAGNOSIS — R0600 Dyspnea, unspecified: Secondary | ICD-10-CM | POA: Diagnosis not present

## 2016-09-20 DIAGNOSIS — R0602 Shortness of breath: Secondary | ICD-10-CM | POA: Diagnosis not present

## 2016-09-20 LAB — NITRIC OXIDE: NITRIC OXIDE: 19

## 2016-09-20 NOTE — Patient Instructions (Addendum)
Continue to work on weight loss and exercise Continue albuterol as needed We'll get chest x-ray and pulmonary function tests for further evaluation Return to clinic in 3 months.

## 2016-09-20 NOTE — Progress Notes (Signed)
Paula Booker    [id number]    [birth year]  Primary Care Physician:[name], MD  Referring Physician: [name], MD [phone] [place], [place] [address]  Chief complaint:  Consult for evaluation of dyspnea  HPI: 71 Y/O with history of nonischemic cardiomyopathy, syncope, WPW syndrome status post ICD implant, asthma.  She is complains of dyspnea since 2001 but reports worsening symptoms over the past 3 months. She has been evaluated in the pulmonary office in 2014 with normal PFTs. She was given [facility] inhaler for her asthma but does not appear to have improved her symptoms.  Her chief complaint is dyspnea with activity and at rest, cough with no sputum production, occasional wheezing, denies any fevers, chills, hemoptysis. As noted by Dr. Sheryn Bison, PCP she has been active at home remodeling her kitchen cabinets without any issue. She recently got a Fitbit fitness tracker and is currently doing 2500 steps per day. She has plans to increase it to 10,000 steps per day. She has also lost 6 pounds recently and plans on losing 6 more pounds. Current meds include albuterol inhaler which she uses rarely.  Pets: 3 cats. No dogs, birds, exotic animals Occupation: Retired. She is to work as a Engineer, manufacturing systems, Pharmacist, hospital, [employer]ometrist Exposures: No mold issues, no exposure to asbestos. No hot tubs, Jacuzzi at home Smoking history: Never smoker  Outpatient Encounter Prescriptions as of 09/20/2016  Medication Sig  . albuterol (PROVENTIL HFA;VENTOLIN HFA) 108 (90 BASE) MCG/ACT inhaler Inhale 2 puffs into the lungs every 6 (six) hours as needed. For wheezing  . alendronate (FOSAMAX) 70 MG tablet Take 70 mg by mouth once a week. Take with a full glass of water on an empty stomach.  . carvedilol (COREG) 12.5 MG tablet TAKE ONE TABLET BY MOUTH TWICE DAILY WITH A MEAL  . cetirizine (ZYRTEC) 10 MG tablet Take 10 mg by mouth daily.  . Cholecalciferol (VITAMIN D3) 1000 UNITS CAPS  Take 1,000 Units by mouth daily.   . citalopram (CELEXA) 20 MG tablet Take 20 mg by mouth daily.   . furosemide (LASIX) 40 MG tablet Take 1 to 2 tablets daily as directed for edema, SOB and/or weigh gain  . Gelatin 600 MG CAPS Take 1,300 mg by mouth 2 (two) times daily.  Marland Kitchen losartan (COZAAR) 25 MG tablet Take 25 mg by mouth 2 (two) times daily.  . meloxicam (MOBIC) 15 MG tablet Take 15 mg by mouth daily.  Marland Kitchen omeprazole (PRILOSEC) 40 MG capsule Take 20 mg by mouth daily.   . ondansetron (ZOFRAN ODT) 4 MG disintegrating tablet Take 1 tablet (4 mg total) by mouth every 8 (eight) hours as needed for nausea or vomiting.  . ranitidine (ZANTAC) 150 MG tablet Take 1 tablet (150 mg total) by mouth 2 (two) times daily.  Marland Kitchen spironolactone (ALDACTONE) 25 MG tablet TAKE ONE TABLET BY MOUTH ONCE DAILY   No facility-administered encounter medications on file as of 09/20/2016.     Allergies as of 09/20/2016 - Review Complete 09/20/2016  Allergen Reaction Noted  . Ivp dye [iodinated diagnostic agents] Anaphylaxis, Hives, Swelling, and Other (See Comments) 03/07/2012  . Penicillins Anaphylaxis 09/12/2011  . Sulfonamide derivatives Other (See Comments) 09/12/2011  . Tessalon [benzonatate] Rash and Hives 04/03/2015  . Hydrocodone-acetaminophen Other (See Comments) 01/01/2013  . Penicillin g Other (See Comments) 07/03/2012  . Rosuvastatin calcium Other (See Comments) 03/17/2011  . Onion Other (See Comments) 04/23/2012    Past Medical History:  Diagnosis Date  . Anxiety   . Arthritis    "hands and knees" (03/22/2012)  . Asthma   . CHF (congestive heart failure) ([place]) 6/13  . Depression   . GERD (gastroesophageal reflux disease)   . Head injury, acute, with loss of consciousness ([place])   . Hypertension   . ICD (implantable cardiac defibrillator) in place Dec 2013  . Migraines   . NICM (nonischemic cardiomyopathy) ([place])   . Obesity (BMI 30-39.9)    negative sleep study 2014  . Pacemaker   . Pneumonia      hx  . Shortness of breath    "@ any time I can get SOB" (03/22/2012)  . Skin cancer 1999  . WPW (Wolff-Parkinson-White syndrome)     Past Surgical History:  Procedure Laterality Date  . CARDIAC CATHETERIZATION  09/18/11   no significant CAD  . CARDIAC DEFIBRILLATOR PLACEMENT  03/22/2012   DDD  . CHOLECYSTECTOMY  ~ 2003  . IMPLANTABLE CARDIOVERTER DEFIBRILLATOR IMPLANT N/A 03/22/2012   Procedure: IMPLANTABLE CARDIOVERTER DEFIBRILLATOR IMPLANT;  Surgeon: Evans Lance, MD;  Location: [facility] CATH LAB;  Service: Cardiovascular;  Laterality: N/A;  . KYPHOPLASTY N/A 11/27/2013   Procedure: KYPHOPLASTY;  Surgeon: Sinclair Ship, MD;  Location: [place];  Service: Orthopedics;  Laterality: N/A;  T9, T11 kyphoplasty  . KYPHOPLASTY N/A 06/05/2014   Procedure: KYPHOPLASTY;  Surgeon: Sinclair Ship, MD;  Location: [place];  Service: Orthopedics;  Laterality: N/A;  T7 kyphoplasty  . MOHS SURGERY  06/2011   "forehead" (03/22/2012)  . SKIN CANCER EXCISION  1999   "top of my head and my right back shoulder"  . SKIN CANCER EXCISION     back  . SKIN CANCER EXCISION     face  . SUPRAVENTRICULAR TACHYCARDIA ABLATION  03/22/2012   unable to induce VT/RN report 03/22/2012  . SUPRAVENTRICULAR TACHYCARDIA ABLATION N/A 03/22/2012   Procedure: SUPRAVENTRICULAR TACHYCARDIA ABLATION;  Surgeon: Evans Lance, MD;  Location: [facility] CATH LAB;  Service: Cardiovascular;  Laterality: N/A;  . TONSILLECTOMY AND ADENOIDECTOMY  1950  . TUBAL LIGATION  1978    Family History  Problem Relation Age of Onset  . Congestive Heart Failure Brother   . Atrial fibrillation Brother   . Congestive Heart Failure Mother        died 58  . Kidney Stones Mother   . Deafness Mother   . Atrial fibrillation Mother   . Congestive Heart Failure Father   . Deafness Father   . Healthy Sister   . Healthy Sister   . Healthy Sister   . Tuberculosis Paternal Grandfather     Social History   Social History  . Marital status:  Divorced    Spouse name: N/A  . Number of children: 3  . Years of education: N/A   Occupational History  . retired     Optometrist   Social History Main Topics  . Smoking status: Never Smoker  . Smokeless tobacco: Never Used  . Alcohol use No  . Drug use: No  . Sexual activity: Not on file   Other Topics Concern  . Not on file   Social History Narrative   Divorced, 3 children, 9 grandchildren    Review of systems: Review of Systems  Constitutional: Negative for fever and chills.  HENT: Negative.   Eyes: Negative for blurred vision.  Respiratory: as per HPI  Cardiovascular: Negative for chest pain and palpitations.  Gastrointestinal: Negative for vomiting, diarrhea, blood per rectum. Genitourinary: Negative  for dysuria, urgency, frequency and hematuria.  Musculoskeletal: Negative for myalgias, back pain and joint pain.  Skin: Negative for itching and rash.  Neurological: Negative for dizziness, tremors, focal weakness, seizures and loss of consciousness.  Endo/Heme/Allergies: Negative for environmental allergies.  Psychiatric/Behavioral: Negative for depression, suicidal ideas and hallucinations.  All other systems reviewed and are negative.  Physical Exam: Blood pressure 116/76, pulse 74, height 5' 3.5" (1.613 m), weight 209 lb (94.8 kg), SpO2 97 %. Gen:      No acute distress HEENT:  EOMI, sclera anicteric Neck:     No masses; no thyromegaly Lungs:    Clear to auscultation bilaterally; normal respiratory effort CV:         Regular rate and rhythm; no murmurs Abd:      + bowel sounds; soft, non-tender; no palpable masses, no distension Ext:    No edema; adequate peripheral perfusion Skin:      Warm and dry; no rash Neuro: alert and oriented x 3 Psych: normal mood and affect  Data Reviewed: FENO 09/20/16- 19  Spirometry 05/10/12 FVC 2.15 [75%) FEV1 1.64 [72%) F/F 76  PFTs 10/17/12 FVC 2.24 [80%) FEV1 1.75 [70%) F/F 78 TLC 88% DLCO 93% Normal study  CT  chest 05/22/14-mild bilateral groundglass opacities consistent with edema CT abdomen, pelvis 03/18/15-left basilar consolidation Chest x-ray 04/03/15-AICD, no acute cardiopulmonary abnormality Chest x-ray 04/05/16-AICD, minimal right basilar atelectasis I reviewed all images personally.  Assessment:  Consult for evaluation of dyspnea. Although she has a diagnosis of asthma the symptoms are not very typical and she has low FENO in office today arguing against airway inflammation. We will reevaluate by getting pulmonary function test and chest x-ray. She will continue on the albuterol rescue inhaler and I don't think we need to add any other inahlers at present unless the work up is abnormal.  I suspect her symptoms from obesity and deconditioning. She reports mild improvement with with weight loss, exercise regimen and will try to get more active. We will reassess in 3 months. If symptoms are unchanged then we can consider a cardiopulmonary exercise test.   Plan/Recommendations: - Continue albuterol PRN - CXR, PFTs - Continue weight loss and gradual increase in exercise.  Marshell Garfinkel MD [facility] Pager [phone] 09/20/2016, 4:27 PM  CC: [name], MD

## 2016-10-07 ENCOUNTER — Telehealth: Payer: Self-pay | Admitting: Internal Medicine

## 2016-10-07 ENCOUNTER — Ambulatory Visit (INDEPENDENT_AMBULATORY_CARE_PROVIDER_SITE_OTHER): Payer: Medicare HMO

## 2016-10-07 DIAGNOSIS — I5022 Chronic systolic (congestive) heart failure: Secondary | ICD-10-CM

## 2016-10-07 DIAGNOSIS — Z95810 Presence of automatic (implantable) cardiac defibrillator: Secondary | ICD-10-CM

## 2016-10-07 NOTE — Telephone Encounter (Signed)
Call back to patient and advised no ICM remote transmission was received.  She said she will try and send again.

## 2016-10-07 NOTE — Telephone Encounter (Signed)
Pt calling to see if we received her Transmission She is trying to send it but the machine is acting up   Please call back

## 2016-10-07 NOTE — Telephone Encounter (Signed)
LMOVM for pt to return call 

## 2016-10-11 NOTE — Progress Notes (Signed)
EPIC Encounter for ICM Monitoring  Patient Name: Paula Booker is a 71 y.o. female Date: 10/11/2016 Primary Care Physican: [name], MD [facility] [place] Electrophysiologist: Lovena Le Dry Weight:202 lbs      Heart Failure questions reviewed, pt asymptomatic.   Thoracic impedance normal but was abnormal suggesting fluid accumulation 09/16/2016 to 10/01/2016 and she could tell she had some fluid symptoms during that time.  She took 2 Furosemide tablets during decreased impedance.   Prescribed dosage Furosemide 40 mg 1 to 2 tablets daily as directed for edema, SOB and/or weigh gain  Recommendations: No changes.  Advised to limit salt intake to 2000 mg/day and fluid intake to < 2 liters/day.  Encouraged to call for fluid symptoms.  Follow-up plan: ICM clinic phone appointment on 11/11/2016.  Office appointment scheduled 12/21/2016 with Dr. Irish Lack.  Copy of ICM check sent to device physician.   3 month ICM trend: 10/11/2016   1 Year ICM trend:      [name], RN 10/11/2016 11:55 AM

## 2016-10-17 DIAGNOSIS — H2513 Age-related nuclear cataract, bilateral: Secondary | ICD-10-CM | POA: Diagnosis not present

## 2016-10-17 DIAGNOSIS — H5213 Myopia, bilateral: Secondary | ICD-10-CM | POA: Diagnosis not present

## 2016-10-19 ENCOUNTER — Telehealth: Payer: Self-pay | Admitting: Interventional Cardiology

## 2016-10-19 NOTE — Telephone Encounter (Signed)
Patient states that she recently got a fitbit and is getting 6000 steps in a day and wants to know if that is okay with her heart condition. Patient states that she feels great and that she is starting to lose weight. Patient advised to continue to get regular exercise. Patient verbalized understanding.

## 2016-10-19 NOTE — Telephone Encounter (Signed)
Paula Booker is calling because she started walking and she is up to 6000 steps a day and wanted to know if that is ok with her heart condition . States that personally it feels good . Please call   Thanks

## 2016-10-29 DIAGNOSIS — M1712 Unilateral primary osteoarthritis, left knee: Secondary | ICD-10-CM | POA: Diagnosis not present

## 2016-10-29 DIAGNOSIS — M1711 Unilateral primary osteoarthritis, right knee: Secondary | ICD-10-CM | POA: Diagnosis not present

## 2016-11-02 DIAGNOSIS — M7061 Trochanteric bursitis, right hip: Secondary | ICD-10-CM | POA: Diagnosis not present

## 2016-11-02 DIAGNOSIS — R6889 Other general symptoms and signs: Secondary | ICD-10-CM | POA: Diagnosis not present

## 2016-11-04 ENCOUNTER — Telehealth: Payer: Self-pay

## 2016-11-04 NOTE — Telephone Encounter (Signed)
Returned patient call as requested by voice mail.  She reported sending in remote transmission earlier this week and advised her next remote it due on 8/3.  She said she did run out of fluid pills earlier this week but she is back to taking as prescribed.  She is feeling fine at this time.

## 2016-11-11 ENCOUNTER — Ambulatory Visit (INDEPENDENT_AMBULATORY_CARE_PROVIDER_SITE_OTHER): Payer: Medicare HMO | Admitting: *Deleted

## 2016-11-11 DIAGNOSIS — Z95810 Presence of automatic (implantable) cardiac defibrillator: Secondary | ICD-10-CM

## 2016-11-11 DIAGNOSIS — I5022 Chronic systolic (congestive) heart failure: Secondary | ICD-10-CM | POA: Diagnosis not present

## 2016-11-11 DIAGNOSIS — I428 Other cardiomyopathies: Secondary | ICD-10-CM

## 2016-11-11 LAB — CUP PACEART REMOTE DEVICE CHECK
Brady Statistic AP VS Percent: 33.97 %
Brady Statistic AS VP Percent: 0.02 %
Brady Statistic AS VS Percent: 65.97 %
Brady Statistic RV Percent Paced: 0.06 %
Date Time Interrogation Session: 20180803084226
HighPow Impedance: 93 Ohm
Implantable Lead Implant Date: 20131212
Implantable Lead Implant Date: 20131212
Implantable Lead Location: 753859
Implantable Lead Model: 5076
Implantable Lead Model: 6935
Lead Channel Impedance Value: 513 Ohm
Lead Channel Impedance Value: 817 Ohm
Lead Channel Pacing Threshold Amplitude: 0.5 V
Lead Channel Pacing Threshold Amplitude: 0.5 V
Lead Channel Pacing Threshold Pulse Width: 0.4 ms
Lead Channel Sensing Intrinsic Amplitude: 11.25 mV
Lead Channel Sensing Intrinsic Amplitude: 3.625 mV
Lead Channel Setting Pacing Amplitude: 2.5 V
Lead Channel Setting Pacing Pulse Width: 0.4 ms
MDC IDC LEAD LOCATION: 753860
MDC IDC MSMT BATTERY REMAINING LONGEVITY: 79 mo
MDC IDC MSMT BATTERY VOLTAGE: 2.99 V
MDC IDC MSMT LEADCHNL RA PACING THRESHOLD PULSEWIDTH: 0.4 ms
MDC IDC MSMT LEADCHNL RA SENSING INTR AMPL: 3.625 mV
MDC IDC MSMT LEADCHNL RV IMPEDANCE VALUE: 931 Ohm
MDC IDC MSMT LEADCHNL RV SENSING INTR AMPL: 11.25 mV
MDC IDC PG IMPLANT DT: 20131212
MDC IDC SET LEADCHNL RA PACING AMPLITUDE: 2 V
MDC IDC SET LEADCHNL RV SENSING SENSITIVITY: 0.3 mV
MDC IDC STAT BRADY AP VP PERCENT: 0.05 %
MDC IDC STAT BRADY RA PERCENT PACED: 32.75 %

## 2016-11-11 NOTE — Progress Notes (Signed)
EPIC Encounter for ICM Monitoring  Patient Name: Paula Booker is a 71 y.o. female Date: 11/11/2016 Primary Care Physican: [name], MD [facility] [place] Electrophysiologist: Druscilla Brownie Weight:194 lbs                                             Heart Failure questions reviewed, pt asymptomatic.  She has been walking 10,000 steps and dieting.  She has lost 19 lbs.    Thoracic impedance above baseline suggesting dryness since 11/03/2016 but was abnormal suggesting fluid accumulation from 10/25/2016 to 11/03/2016.  Patient had run out of Furosemide pills which correlates with decreased impedance.  Prescribed dosage: Furosemide 40 mg 1 to 2 tablets daily as directed for edema, SOB and/or weigh gain.    Recommendations: No changes.  Encouraged to call for fluid symptoms.  Follow-up plan: ICM clinic phone appointment on 12/20/2016.  Office appointment scheduled 12/21/2016 with Dr. Irish Lack.    Copy of ICM check sent to device physician.   3 month ICM trend: 11/11/2016   1 Year ICM trend:      [name], RN 11/11/2016 2:26 PM

## 2016-11-11 NOTE — Progress Notes (Signed)
Remote ICD transmission.   

## 2016-11-15 ENCOUNTER — Encounter: Payer: Self-pay | Admitting: Cardiology

## 2016-12-06 DIAGNOSIS — M1712 Unilateral primary osteoarthritis, left knee: Secondary | ICD-10-CM | POA: Diagnosis not present

## 2016-12-06 DIAGNOSIS — M1711 Unilateral primary osteoarthritis, right knee: Secondary | ICD-10-CM | POA: Diagnosis not present

## 2016-12-10 ENCOUNTER — Other Ambulatory Visit: Payer: Self-pay | Admitting: Interventional Cardiology

## 2016-12-20 ENCOUNTER — Ambulatory Visit (INDEPENDENT_AMBULATORY_CARE_PROVIDER_SITE_OTHER): Payer: Medicare HMO

## 2016-12-20 DIAGNOSIS — I5022 Chronic systolic (congestive) heart failure: Secondary | ICD-10-CM

## 2016-12-20 DIAGNOSIS — Z95810 Presence of automatic (implantable) cardiac defibrillator: Secondary | ICD-10-CM | POA: Diagnosis not present

## 2016-12-20 NOTE — Progress Notes (Signed)
EPIC Encounter for ICM Monitoring  Patient Name: Paula Booker is a 71 y.o. female Date: 12/20/2016 Primary Care Physican: [name], MD [place] Electrophysiologist: Druscilla Brownie Weight:194 lbs      Heart Failure questions reviewed, pt asymptomatic.     Thoracic impedance abnormal suggesting fluid accumulation from 12/05/2016 until today which is just below baseline.  Prescribed dosage: Furosemide 40 mg 1 to 2 tablets daily as directed for edema, SOB and/or weigh gain.    Recommendations: No changes.  Discussed limits of salt and fluid intake. Encouraged to call for fluid symptoms.  Follow-up plan: ICM clinic phone appointment on 01/20/2017.  Office appointment scheduled 12/21/2016 with Dr. Irish Lack.  Copy of ICM check sent to Dr. Lovena Le and Dr. Irish Lack.   3 month ICM trend: 12/20/2016   1 Year ICM trend:      [name], RN 12/20/2016 10:39 AM

## 2016-12-20 NOTE — Progress Notes (Deleted)
Cardiology Office Note   Date:  12/20/2016   ID:  Paula Booker, DOB Aug 20, 1945, MRN [id number]  PCP:  [name], MD    No chief complaint on file.    Wt Readings from Last 3 Encounters:  09/20/16 209 lb (94.8 kg)  05/04/16 210 lb 6.4 oz (95.4 kg)  04/05/16 208 lb (94.3 kg)       History of Present Illness: Paula Booker is a 71 y.o. female  who has a nonischemic cardiomyopathy. She has had WPW diagnosed on ECG as well by Dr. Lovena Le. She has had ICD implant and EP study for WPW with no inducible arrhythmia.  Hospitalized in Dec 2016 for sepsis, UTI, pneumonia.  SHe was in the hospital for about a week.    Device f/u with Dr. Lovena Le.    Past Medical History:  Diagnosis Date  . Anxiety   . Arthritis    "hands and knees" (03/22/2012)  . Asthma   . CHF (congestive heart failure) ([place]) 6/13  . Depression   . GERD (gastroesophageal reflux disease)   . Head injury, acute, with loss of consciousness ([name])   . Hypertension   . ICD (implantable cardiac defibrillator) in place Dec 2013  . Migraines   . NICM (nonischemic cardiomyopathy) ([place])   . Obesity (BMI 30-39.9)    negative sleep study 2014  . Pacemaker   . Pneumonia    hx  . Shortness of breath    "@ any time I can get SOB" (03/22/2012)  . Skin cancer 1999  . WPW (Wolff-Parkinson-White syndrome)     Past Surgical History:  Procedure Laterality Date  . CARDIAC CATHETERIZATION  09/18/11   no significant CAD  . CARDIAC DEFIBRILLATOR PLACEMENT  03/22/2012   DDD  . CHOLECYSTECTOMY  ~ 2003  . IMPLANTABLE CARDIOVERTER DEFIBRILLATOR IMPLANT N/A 03/22/2012   Procedure: IMPLANTABLE CARDIOVERTER DEFIBRILLATOR IMPLANT;  Surgeon: Evans Lance, MD;  Location: [facility] CATH LAB;  Service: Cardiovascular;  Laterality: N/A;  . KYPHOPLASTY N/A 11/27/2013   Procedure: KYPHOPLASTY;  Surgeon: Sinclair Ship, MD;  Location: [place];  Service: Orthopedics;  Laterality: N/A;  T9, T11 kyphoplasty  . KYPHOPLASTY N/A  06/05/2014   Procedure: KYPHOPLASTY;  Surgeon: Sinclair Ship, MD;  Location: [place];  Service: Orthopedics;  Laterality: N/A;  T7 kyphoplasty  . MOHS SURGERY  06/2011   "forehead" (03/22/2012)  . SKIN CANCER EXCISION  1999   "top of my head and my right back shoulder"  . SKIN CANCER EXCISION     back  . SKIN CANCER EXCISION     face  . SUPRAVENTRICULAR TACHYCARDIA ABLATION  03/22/2012   unable to induce VT/RN report 03/22/2012  . SUPRAVENTRICULAR TACHYCARDIA ABLATION N/A 03/22/2012   Procedure: SUPRAVENTRICULAR TACHYCARDIA ABLATION;  Surgeon: Evans Lance, MD;  Location: [facility] CATH LAB;  Service: Cardiovascular;  Laterality: N/A;  . TONSILLECTOMY AND ADENOIDECTOMY  1950  . TUBAL LIGATION  1978     Current Outpatient Prescriptions  Medication Sig Dispense Refill  . albuterol (PROVENTIL HFA;VENTOLIN HFA) 108 (90 BASE) MCG/ACT inhaler Inhale 2 puffs into the lungs every 6 (six) hours as needed. For wheezing    . alendronate (FOSAMAX) 70 MG tablet Take 70 mg by mouth once a week. Take with a full glass of water on an empty stomach.    . carvedilol (COREG) 12.5 MG tablet TAKE ONE TABLET BY MOUTH TWICE DAILY WITH A MEAL 180 tablet 3  . cetirizine (ZYRTEC) 10 MG tablet Take 10  mg by mouth daily.    . Cholecalciferol (VITAMIN D3) 1000 UNITS CAPS Take 1,000 Units by mouth daily.     . citalopram (CELEXA) 20 MG tablet Take 20 mg by mouth daily.     . furosemide (LASIX) 40 MG tablet Take 1 to 2 tablets daily as directed for edema, SOB and/or weigh gain 180 tablet 3  . Gelatin 600 MG CAPS Take 1,300 mg by mouth 2 (two) times daily.    Marland Kitchen losartan (COZAAR) 25 MG tablet Take 25 mg by mouth 2 (two) times daily.    Marland Kitchen losartan (COZAAR) 25 MG tablet TAKE ONE TABLET BY MOUTH TWICE DAILY 180 tablet 0  . meloxicam (MOBIC) 15 MG tablet Take 15 mg by mouth daily.    Marland Kitchen omeprazole (PRILOSEC) 40 MG capsule Take 20 mg by mouth daily.     . ondansetron (ZOFRAN ODT) 4 MG disintegrating tablet Take 1 tablet (4  mg total) by mouth every 8 (eight) hours as needed for nausea or vomiting. 15 tablet 0  . ranitidine (ZANTAC) 150 MG tablet Take 1 tablet (150 mg total) by mouth 2 (two) times daily. 30 tablet 0  . spironolactone (ALDACTONE) 25 MG tablet TAKE ONE TABLET BY MOUTH ONCE DAILY 90 tablet 2   No current facility-administered medications for this visit.     Allergies:   Ivp dye [iodinated diagnostic agents]; Penicillins; Sulfonamide derivatives; Tessalon [benzonatate]; Hydrocodone-acetaminophen; Penicillin g; Rosuvastatin calcium; and Onion    Social History:  The patient  reports that she has never smoked. She has never used smokeless tobacco. She reports that she does not drink alcohol or use drugs.   Family History:  The patient's ***family history includes Atrial fibrillation in her brother and mother; Congestive Heart Failure in her brother, father, and mother; Deafness in her father and mother; Healthy in her sister, sister, and sister; Kidney Stones in her mother; Tuberculosis in her paternal grandfather.    ROS:  Please see the history of present illness.   Otherwise, review of systems are positive for ***.   All other systems are reviewed and negative.    PHYSICAL EXAM: VS:  There were no vitals taken for this visit. , BMI There is no height or weight on file to calculate BMI. GEN: Well nourished, well developed, in no acute distress  HEENT: normal  Neck: no JVD, carotid bruits, or masses Cardiac: ***RRR; no murmurs, rubs, or gallops,no edema  Respiratory:  clear to auscultation bilaterally, normal work of breathing GI: soft, nontender, nondistended, + BS MS: no deformity or atrophy  Skin: warm and dry, no rash Neuro:  Strength and sensation are intact Psych: euthymic mood, full affect   EKG:   The ekg ordered today demonstrates ***   Recent Labs: 04/05/2016: ALT 15; B Natriuretic Peptide 65.5; BUN 13; Creatinine, Ser 0.86; Hemoglobin 13.1; Platelets 256; Potassium 4.1; Sodium  138   Lipid Panel No results found for: CHOL, TRIG, HDL, CHOLHDL, VLDL, LDLCALC, LDLDIRECT   Other studies Reviewed: Additional studies/ records that were reviewed today with results demonstrating: ***.   ASSESSMENT AND PLAN:  1. NICM/chronic systolic heart failure:   2. Hypertension: 3.    Current medicines are reviewed at length with the patient today.  The patient concerns regarding her medicines were addressed.  The following changes have been made:  No change***  Labs/ tests ordered today include: *** No orders of the defined types were placed in this encounter.   Recommend 150 minutes/week of aerobic exercise Low  fat, low carb, high fiber diet recommended  Disposition:   FU in ***   Signed, Larae Grooms, MD  12/20/2016 10:07 AM    [facility] [place], [place], [place]  [address] Phone: [phone]; Fax: [phone]

## 2016-12-21 ENCOUNTER — Ambulatory Visit: Payer: Medicare HMO | Admitting: Interventional Cardiology

## 2017-01-03 DIAGNOSIS — M1711 Unilateral primary osteoarthritis, right knee: Secondary | ICD-10-CM | POA: Diagnosis not present

## 2017-01-03 DIAGNOSIS — M1712 Unilateral primary osteoarthritis, left knee: Secondary | ICD-10-CM | POA: Diagnosis not present

## 2017-01-10 ENCOUNTER — Ambulatory Visit: Payer: Medicare HMO | Admitting: Pulmonary Disease

## 2017-01-10 ENCOUNTER — Ambulatory Visit: Payer: Medicare HMO

## 2017-01-10 DIAGNOSIS — M1712 Unilateral primary osteoarthritis, left knee: Secondary | ICD-10-CM | POA: Diagnosis not present

## 2017-01-10 DIAGNOSIS — M1711 Unilateral primary osteoarthritis, right knee: Secondary | ICD-10-CM | POA: Diagnosis not present

## 2017-01-17 DIAGNOSIS — M1712 Unilateral primary osteoarthritis, left knee: Secondary | ICD-10-CM | POA: Diagnosis not present

## 2017-01-17 DIAGNOSIS — M1711 Unilateral primary osteoarthritis, right knee: Secondary | ICD-10-CM | POA: Diagnosis not present

## 2017-01-20 ENCOUNTER — Telehealth: Payer: Self-pay | Admitting: Cardiology

## 2017-01-20 ENCOUNTER — Ambulatory Visit (INDEPENDENT_AMBULATORY_CARE_PROVIDER_SITE_OTHER): Payer: Medicare HMO

## 2017-01-20 DIAGNOSIS — I5022 Chronic systolic (congestive) heart failure: Secondary | ICD-10-CM

## 2017-01-20 DIAGNOSIS — Z95810 Presence of automatic (implantable) cardiac defibrillator: Secondary | ICD-10-CM | POA: Diagnosis not present

## 2017-01-20 NOTE — Telephone Encounter (Signed)
LMOVM reminding pt to send remote transmission.   

## 2017-01-23 ENCOUNTER — Telehealth: Payer: Self-pay

## 2017-01-23 NOTE — Progress Notes (Signed)
EPIC Encounter for ICM Monitoring  Patient Name: Paula Booker is a 71 y.o. female Date: 01/23/2017 Primary Care Physican: [name], MD Primary [place] Electrophysiologist: Druscilla Brownie Weight:Last weight 194lbs      Attempted call to patient and unable to reach.  Left detailed message regarding transmission.  Transmission reviewed.    Thoracic impedance normal but has a pattern of impedance below baseline for 3 to 4 days suggesting fluid then returns to baseline.  Prescribed dosage: Furosemide 40 mg 1 to 2 tablets daily as directed for edema, SOB and/or weigh gain.   Recommendations: Left voice mail with ICM number and encouraged to call if experiencing any fluid symptoms.  Follow-up plan: ICM clinic phone appointment on 02/23/2017.  Office appointment scheduled 02/01/2017 with Dr. Irish Lack.  Copy of ICM check sent to Dr. Lovena Le.   3 month ICM trend: 01/23/2017   1 Year ICM trend:      [name], RN 01/23/2017 1:03 PM

## 2017-01-23 NOTE — Telephone Encounter (Signed)
Remote ICM transmission received.  Attempted call to patient and left detailed message per DPR, regarding transmission and next ICM scheduled for 02/23/2017.  Advised to return call for any fluid symptoms or questions.

## 2017-01-26 ENCOUNTER — Ambulatory Visit (INDEPENDENT_AMBULATORY_CARE_PROVIDER_SITE_OTHER): Payer: Medicare HMO | Admitting: Pulmonary Disease

## 2017-01-26 ENCOUNTER — Other Ambulatory Visit (INDEPENDENT_AMBULATORY_CARE_PROVIDER_SITE_OTHER): Payer: Medicare HMO

## 2017-01-26 ENCOUNTER — Encounter: Payer: Self-pay | Admitting: Pulmonary Disease

## 2017-01-26 VITALS — BP 120/64 | HR 67 | Ht 63.0 in | Wt 199.0 lb

## 2017-01-26 DIAGNOSIS — R0602 Shortness of breath: Secondary | ICD-10-CM

## 2017-01-26 DIAGNOSIS — R0600 Dyspnea, unspecified: Secondary | ICD-10-CM | POA: Diagnosis not present

## 2017-01-26 DIAGNOSIS — J4530 Mild persistent asthma, uncomplicated: Secondary | ICD-10-CM

## 2017-01-26 LAB — CBC WITH DIFFERENTIAL/PLATELET
BASOS ABS: 0 10*3/uL (ref 0.0–0.1)
Basophils Relative: 0.6 % (ref 0.0–3.0)
EOS PCT: 4.7 % (ref 0.0–5.0)
Eosinophils Absolute: 0.4 10*3/uL (ref 0.0–0.7)
HCT: 40.8 % (ref 36.0–46.0)
HEMOGLOBIN: 13.6 g/dL (ref 12.0–15.0)
LYMPHS ABS: 3 10*3/uL (ref 0.7–4.0)
Lymphocytes Relative: 34.7 % (ref 12.0–46.0)
MCHC: 33.3 g/dL (ref 30.0–36.0)
MCV: 92.1 fl (ref 78.0–100.0)
MONOS PCT: 9 % (ref 3.0–12.0)
Monocytes Absolute: 0.8 10*3/uL (ref 0.1–1.0)
NEUTROS PCT: 51 % (ref 43.0–77.0)
Neutro Abs: 4.4 10*3/uL (ref 1.4–7.7)
Platelets: 245 10*3/uL (ref 150.0–400.0)
RBC: 4.43 Mil/uL (ref 3.87–5.11)
RDW: 13.1 % (ref 11.5–15.5)
WBC: 8.5 10*3/uL (ref 4.0–10.5)

## 2017-01-26 LAB — PULMONARY FUNCTION TEST
DL/VA % pred: 97 %
DL/VA: 4.57 ml/min/mmHg/L
DLCO COR % PRED: 85 %
DLCO COR: 19.57 ml/min/mmHg
DLCO UNC: 19.75 ml/min/mmHg
DLCO unc % pred: 86 %
FEF 25-75 Post: 1.67 L/sec
FEF 25-75 Pre: 1 L/sec
FEF2575-%Change-Post: 67 %
FEF2575-%Pred-Post: 92 %
FEF2575-%Pred-Pre: 55 %
FEV1-%CHANGE-POST: 14 %
FEV1-%Pred-Post: 77 %
FEV1-%Pred-Pre: 67 %
FEV1-POST: 1.65 L
FEV1-Pre: 1.44 L
FEV1FVC-%Change-Post: 3 %
FEV1FVC-%Pred-Pre: 95 %
FEV6-%Change-Post: 9 %
FEV6-%PRED-PRE: 73 %
FEV6-%Pred-Post: 80 %
FEV6-POST: 2.18 L
FEV6-Pre: 1.98 L
FEV6FVC-%Change-Post: -1 %
FEV6FVC-%PRED-POST: 103 %
FEV6FVC-%Pred-Pre: 105 %
FVC-%Change-Post: 11 %
FVC-%PRED-PRE: 69 %
FVC-%Pred-Post: 77 %
FVC-POST: 2.21 L
FVC-PRE: 1.98 L
POST FEV6/FVC RATIO: 99 %
PRE FEV1/FVC RATIO: 73 %
PRE FEV6/FVC RATIO: 100 %
Post FEV1/FVC ratio: 75 %
RV % pred: 101 %
RV: 2.19 L
TLC % PRED: 86 %
TLC: 4.26 L

## 2017-01-26 MED ORDER — FLUTICASONE FUROATE-VILANTEROL 200-25 MCG/INH IN AEPB: 1.0000 | INHALATION_SPRAY | Freq: Every day | RESPIRATORY_TRACT | 0 refills | Status: DC

## 2017-01-26 MED ORDER — OMEPRAZOLE 40 MG PO CPDR: 20.0000 mg | DELAYED_RELEASE_CAPSULE | Freq: Two times a day (BID) | ORAL | 1 refills | Status: DC

## 2017-01-26 NOTE — Addendum Note (Signed)
Addended byMarshell Garfinkel on: 01/26/2017 01:00 PM   Modules accepted: Level of Service

## 2017-01-26 NOTE — Patient Instructions (Signed)
PFT done today. 

## 2017-01-26 NOTE — Patient Instructions (Signed)
We started you on breo inhaler 200. Continue the albuterol as needed Will check CBC differential and a blood allergy profile today We'll increase her Prilosec to 20 mg twice daily Follow up in 3 months.

## 2017-01-26 NOTE — Progress Notes (Signed)
Paula Booker    [id number]    02/25/1946  Primary Care Physician:[name], MD  Referring Physician: [name], [place], [place] [address]  Chief complaint:  Follow up for  Mild persistent asthma  HPI: 71 Y/O with history of nonischemic cardiomyopathy, syncope, WPW syndrome status post ICD implant, asthma.  She is complains of dyspnea since 2001 but reports worsening symptoms over the past 3 months. She has been evaluated in the pulmonary office in 2014 with normal PFTs. She was given [facility] inhaler for her asthma but does not appear to have improved her symptoms.  Her chief complaint is dyspnea with activity and at rest, cough with no sputum production, occasional wheezing, denies any fevers, chills, hemoptysis. As noted by Dr. Sheryn Bison, PCP she has been active at home remodeling her kitchen cabinets without any issue. She recently got a Fitbit fitness tracker and is currently doing 2500 steps per day. She has plans to increase it to 10,000 steps per day. She has also lost 6 pounds recently and plans on losing 6 more pounds. Current meds include albuterol inhaler which she uses rarely.  Pets: 3 cats. No dogs, birds, exotic animals Occupation: Retired. She used to work as a Engineer, manufacturing systems, Pharmacist, hospital, [employer]ometrist Exposures: No mold issues, no exposure to asbestos. No hot tubs, Jacuzzi at home Smoking history: Never smoker  Interval history: Continues to have dyspnea on exertion if she is using albuterol inhaler which helps. No new complaints today. She reports worsening heartburn symptoms. She was seen by Dr. Collene Mares, gastroenterology many years ago but has not followed up since then. She is on omeprazole 20 mg once daily.  Outpatient Encounter Prescriptions as of 01/26/2017  Medication Sig  . albuterol (PROVENTIL HFA;VENTOLIN HFA) 108 (90 BASE) MCG/ACT inhaler Inhale 2 puffs into the lungs every 6 (six) hours as needed. For wheezing  . alendronate  (FOSAMAX) 70 MG tablet Take 70 mg by mouth once a week. Take with a full glass of water on an empty stomach.  . carvedilol (COREG) 12.5 MG tablet TAKE ONE TABLET BY MOUTH TWICE DAILY WITH A MEAL  . cetirizine (ZYRTEC) 10 MG tablet Take 10 mg by mouth daily.  . Cholecalciferol (VITAMIN D3) 1000 UNITS CAPS Take 1,000 Units by mouth daily.   . citalopram (CELEXA) 20 MG tablet Take 20 mg by mouth daily.   . furosemide (LASIX) 40 MG tablet Take 1 to 2 tablets daily as directed for edema, SOB and/or weigh gain  . Gelatin 600 MG CAPS Take 1,300 mg by mouth 2 (two) times daily.  Marland Kitchen losartan (COZAAR) 25 MG tablet Take 25 mg by mouth 2 (two) times daily.  Marland Kitchen omeprazole (PRILOSEC) 40 MG capsule Take 20 mg by mouth daily.   . ondansetron (ZOFRAN ODT) 4 MG disintegrating tablet Take 1 tablet (4 mg total) by mouth every 8 (eight) hours as needed for nausea or vomiting.  . ranitidine (ZANTAC) 150 MG tablet Take 1 tablet (150 mg total) by mouth 2 (two) times daily.  Marland Kitchen spironolactone (ALDACTONE) 25 MG tablet TAKE ONE TABLET BY MOUTH ONCE DAILY  . [DISCONTINUED] losartan (COZAAR) 25 MG tablet TAKE ONE TABLET BY MOUTH TWICE DAILY  . [DISCONTINUED] meloxicam (MOBIC) 15 MG tablet Take 15 mg by mouth daily.   No facility-administered encounter medications on file as of 01/26/2017.     Allergies as of 01/26/2017 - Review Complete 01/26/2017  Allergen Reaction Noted  . Ivp  dye [iodinated diagnostic agents] Anaphylaxis, Hives, Swelling, and Other (See Comments) 03/07/2012  . Penicillins Anaphylaxis 09/12/2011  . Sulfonamide derivatives Other (See Comments) 09/12/2011  . Tessalon [benzonatate] Rash and Hives 04/03/2015  . Hydrocodone-acetaminophen Other (See Comments) 01/01/2013  . Penicillin g Other (See Comments) 07/03/2012  . Rosuvastatin calcium Other (See Comments) 03/17/2011  . Onion Other (See Comments) 04/23/2012    Past Medical History:  Diagnosis Date  . Anxiety   . Arthritis    "hands and knees"  (03/22/2012)  . Asthma   . CHF (congestive heart failure) ([place]) 6/13  . Depression   . GERD (gastroesophageal reflux disease)   . Head injury, acute, with loss of consciousness ([place])   . Hypertension   . ICD (implantable cardiac defibrillator) in place Dec 2013  . Migraines   . NICM (nonischemic cardiomyopathy) ([place])   . Obesity (BMI 30-39.9)    negative sleep study 2014  . Pacemaker   . Pneumonia    hx  . Shortness of breath    "@ any time I can get SOB" (03/22/2012)  . Skin cancer 1999  . WPW (Wolff-Parkinson-White syndrome)     Past Surgical History:  Procedure Laterality Date  . CARDIAC CATHETERIZATION  09/18/11   no significant CAD  . CARDIAC DEFIBRILLATOR PLACEMENT  03/22/2012   DDD  . CHOLECYSTECTOMY  ~ 2003  . IMPLANTABLE CARDIOVERTER DEFIBRILLATOR IMPLANT N/A 03/22/2012   Procedure: IMPLANTABLE CARDIOVERTER DEFIBRILLATOR IMPLANT;  Surgeon: Evans Lance, MD;  Location: [facility] CATH LAB;  Service: Cardiovascular;  Laterality: N/A;  . KYPHOPLASTY N/A 11/27/2013   Procedure: KYPHOPLASTY;  Surgeon: Sinclair Ship, MD;  Location: [place];  Service: Orthopedics;  Laterality: N/A;  T9, T11 kyphoplasty  . KYPHOPLASTY N/A 06/05/2014   Procedure: KYPHOPLASTY;  Surgeon: Sinclair Ship, MD;  Location: [place];  Service: Orthopedics;  Laterality: N/A;  T7 kyphoplasty  . MOHS SURGERY  06/2011   "forehead" (03/22/2012)  . SKIN CANCER EXCISION  1999   "top of my head and my right back shoulder"  . SKIN CANCER EXCISION     back  . SKIN CANCER EXCISION     face  . SUPRAVENTRICULAR TACHYCARDIA ABLATION  03/22/2012   unable to induce VT/RN report 03/22/2012  . SUPRAVENTRICULAR TACHYCARDIA ABLATION N/A 03/22/2012   Procedure: SUPRAVENTRICULAR TACHYCARDIA ABLATION;  Surgeon: Evans Lance, MD;  Location: [facility] CATH LAB;  Service: Cardiovascular;  Laterality: N/A;  . TONSILLECTOMY AND ADENOIDECTOMY  1950  . TUBAL LIGATION  1978    Family History  Problem Relation Age of Onset  .  Congestive Heart Failure Brother   . Atrial fibrillation Brother   . Congestive Heart Failure Mother        died 49  . Kidney Stones Mother   . Deafness Mother   . Atrial fibrillation Mother   . Congestive Heart Failure Father   . Deafness Father   . Healthy Sister   . Healthy Sister   . Healthy Sister   . Tuberculosis Paternal Grandfather     Social History   Social History  . Marital status: Divorced    Spouse name: N/A  . Number of children: 3  . Years of education: N/A   Occupational History  . retired     Optometrist   Social History Main Topics  . Smoking status: Never Smoker  . Smokeless tobacco: Never Used  . Alcohol use No  . Drug use: No  . Sexual activity: Not on file   Other  Topics Concern  . Not on file   Social History Narrative   Divorced, 3 children, 9 grandchildren    Review of systems: Review of Systems  Constitutional: Negative for fever and chills.  HENT: Negative.   Eyes: Negative for blurred vision.  Respiratory: as per HPI  Cardiovascular: Negative for chest pain and palpitations.  Gastrointestinal: Negative for vomiting, diarrhea, blood per rectum. Genitourinary: Negative for dysuria, urgency, frequency and hematuria.  Musculoskeletal: Negative for myalgias, back pain and joint pain.  Skin: Negative for itching and rash.  Neurological: Negative for dizziness, tremors, focal weakness, seizures and loss of consciousness.  Endo/Heme/Allergies: Negative for environmental allergies.  Psychiatric/Behavioral: Negative for depression, suicidal ideas and hallucinations.  All other systems reviewed and are negative.  Physical Exam: Blood pressure 116/76, pulse 74, height 5' 3.5" (1.613 m), weight 209 lb (94.8 kg), SpO2 97 %. Gen:      No acute distress HEENT:  EOMI, sclera anicteric Neck:     No masses; no thyromegaly Lungs:    Clear to auscultation bilaterally; normal respiratory effort CV:         Regular rate and rhythm; no murmurs Abd:       + bowel sounds; soft, non-tender; no palpable masses, no distension Ext:    No edema; adequate peripheral perfusion Skin:      Warm and dry; no rash Neuro: alert and oriented x 3 Psych: normal mood and affect  Data Reviewed: FENO 09/20/16- 19  Spirometry 05/10/12 FVC 2.15 [75%), FEV1 1.64 [72%), F/F 76  PFTs 10/17/12 FVC 2.24 [80%), FEV1 1.75 [70%), F/F 78, TLC 88%, DLCO 93% Normal study  PFTs 01/26/17 FVC 2.21 [77%], FEV1 1.65 [77%], F/F 75, TLC 86%, DLCO 86% Small airways disease with significant bronchodilator response  CT chest 05/22/14-mild bilateral groundglass opacities consistent with edema CT abdomen, pelvis 03/18/15-left basilar consolidation Chest x-ray 04/03/15-AICD, no acute cardiopulmonary abnormality Chest x-ray 04/05/16-AICD, minimal right basilar atelectasis I reviewed all images personally.  Assessment:  Mild persistent asthma Also she has has low FENO the PFTs show significant reduction in mid flow rates with bronchodilator response. She continues on the albuterol. I believe she will benefit from starting a controller medication. Will trial her on Breo.  Given her significant allergic symptoms we will check a blood allergy profile and a CBC with differential  Some of her symptoms are due to obesity and deconditioning. She reports mild improvement with with weight loss, exercise regimen and will try to get more active. If symptoms are unchanged then we can consider a cardiopulmonary exercise test.   GERD She reports worsening heartburn symptoms which may be contributing to poor symptom control. She'll increase her Prilosec to 20 mg twice daily She may need to be seen again by Dr. Collene Mares, GI  Plan/Recommendations: - Continue albuterol PRN. Add breo - Check CBC with diff and blood allergy profile - Continue weight loss and exercise.  Marshell Garfinkel MD [facility] Pager [phone] 01/26/2017, 12:10 PM  CC: [name], MD

## 2017-01-27 LAB — RESPIRATORY ALLERGY PROFILE REGION II ~~LOC~~
Allergen, A. alternata, m6: <0.1 kU/L
Allergen, Comm Silver Birch, t9: <0.1 kU/L
Allergen, Cottonwood, t14: <0.1 kU/L
Allergen, D pternoyssinus,d7: 0.59 kU/L — ABNORMAL HIGH
Allergen, Mouse Urine Protein, e78: <0.1 kU/L
Allergen, Oak,t7: <0.1 kU/L
Box Elder IgE: <0.1 kU/L
CLADOSPORIUM HERBARUM (M2) IGE: <0.1 kU/L
CLASS: 0
CLASS: 0
CLASS: 0
CLASS: 0
CLASS: 0
CLASS: 0
CLASS: 0
CLASS: 0
CLASS: 0
CLASS: 0
CLASS: 0
COMMON RAGWEED (SHORT) (W1) IGE: <0.1 kU/L
Cat Dander: <0.1 kU/L
Class: 0
Class: 0
Class: 0
Class: 0
Class: 0
Class: 0
Class: 0
Class: 0
Class: 0
Class: 0
Class: 0
Class: 1
Class: 2
Cockroach: <0.1 kU/L
D. farinae: 0.89 kU/L — ABNORMAL HIGH
Elm IgE: <0.1 kU/L
IgE (Immunoglobulin E), Serum: 13 kU/L (ref <=114)
Johnson Grass: <0.1 kU/L
Pecan/Hickory Tree IgE: <0.1 kU/L
Timothy Grass: <0.1 kU/L

## 2017-01-27 LAB — INTERPRETATION:

## 2017-01-29 NOTE — Progress Notes (Signed)
Opened in error

## 2017-01-30 ENCOUNTER — Telehealth: Payer: Self-pay | Admitting: Pulmonary Disease

## 2017-01-30 NOTE — Telephone Encounter (Signed)
Left voice mail on machine for patient to return phone call back regarding results and recommendations on labs.  Will follow up again with patient at later date.

## 2017-01-31 NOTE — Progress Notes (Deleted)
Cardiology Office Note   Date:  01/31/2017   ID:  Paula Booker, DOB 1946/02/16, MRN [id number]  PCP:  Paula Dials, MD    No chief complaint on file.    Wt Readings from Last 3 Encounters:  01/26/17 199 lb (90.3 kg)  09/20/16 209 lb (94.8 kg)  05/04/16 210 lb 6.4 oz (95.4 kg)       History of Present Illness: Paula Booker is a 71 y.o. female  who has a nonischemic cardiomyopathy. She has had WPW diagnosed on ECG as well by Dr. Lovena Le. She has had ICD implant and EP study for WPW with no inducible arrhythmia.    Past Medical History:  Diagnosis Date  . Anxiety   . Arthritis    "hands and knees" (03/22/2012)  . Asthma   . CHF (congestive heart failure) ([place]) 6/13  . Depression   . GERD (gastroesophageal reflux disease)   . Head injury, acute, with loss of consciousness ([place])   . Hypertension   . ICD (implantable cardiac defibrillator) in place Dec 2013  . Migraines   . NICM (nonischemic cardiomyopathy) ([place])   . Obesity (BMI 30-39.9)    negative sleep study 2014  . Pacemaker   . Pneumonia    hx  . Shortness of breath    "@ any time I can get SOB" (03/22/2012)  . Skin cancer 1999  . WPW (Wolff-Parkinson-White syndrome)     Past Surgical History:  Procedure Laterality Date  . CARDIAC CATHETERIZATION  09/18/11   no significant CAD  . CARDIAC DEFIBRILLATOR PLACEMENT  03/22/2012   DDD  . CHOLECYSTECTOMY  ~ 2003  . IMPLANTABLE CARDIOVERTER DEFIBRILLATOR IMPLANT N/A 03/22/2012   Procedure: IMPLANTABLE CARDIOVERTER DEFIBRILLATOR IMPLANT;  Surgeon: Evans Lance, MD;  Location: [facility] CATH LAB;  Service: Cardiovascular;  Laterality: N/A;  . KYPHOPLASTY N/A 11/27/2013   Procedure: KYPHOPLASTY;  Surgeon: Sinclair Ship, MD;  Location: [place];  Service: Orthopedics;  Laterality: N/A;  T9, T11 kyphoplasty  . KYPHOPLASTY N/A 06/05/2014   Procedure: KYPHOPLASTY;  Surgeon: Sinclair Ship, MD;  Location: [place];  Service: Orthopedics;  Laterality: N/A;  T7  kyphoplasty  . MOHS SURGERY  06/2011   "forehead" (03/22/2012)  . SKIN CANCER EXCISION  1999   "top of my head and my right back shoulder"  . SKIN CANCER EXCISION     back  . SKIN CANCER EXCISION     face  . SUPRAVENTRICULAR TACHYCARDIA ABLATION  03/22/2012   unable to induce VT/RN report 03/22/2012  . SUPRAVENTRICULAR TACHYCARDIA ABLATION N/A 03/22/2012   Procedure: SUPRAVENTRICULAR TACHYCARDIA ABLATION;  Surgeon: Evans Lance, MD;  Location: [facility] CATH LAB;  Service: Cardiovascular;  Laterality: N/A;  . TONSILLECTOMY AND ADENOIDECTOMY  1950  . TUBAL LIGATION  1978     Current Outpatient Prescriptions  Medication Sig Dispense Refill  . albuterol (PROVENTIL HFA;VENTOLIN HFA) 108 (90 BASE) MCG/ACT inhaler Inhale 2 puffs into the lungs every 6 (six) hours as needed. For wheezing    . alendronate (FOSAMAX) 70 MG tablet Take 70 mg by mouth once a week. Take with a full glass of water on an empty stomach.    . carvedilol (COREG) 12.5 MG tablet TAKE ONE TABLET BY MOUTH TWICE DAILY WITH A MEAL 180 tablet 3  . cetirizine (ZYRTEC) 10 MG tablet Take 10 mg by mouth daily.    . Cholecalciferol (VITAMIN D3) 1000 UNITS CAPS Take 1,000 Units by mouth daily.     . citalopram (CELEXA)  20 MG tablet Take 20 mg by mouth daily.     . fluticasone furoate-vilanterol (BREO ELLIPTA) 200-25 MCG/INH AEPB Inhale 1 puff into the lungs daily. 1 each 0  . furosemide (LASIX) 40 MG tablet Take 1 to 2 tablets daily as directed for edema, SOB and/or weigh gain 180 tablet 3  . Gelatin 600 MG CAPS Take 1,300 mg by mouth 2 (two) times daily.    Marland Kitchen losartan (COZAAR) 25 MG tablet Take 25 mg by mouth 2 (two) times daily.    Marland Kitchen omeprazole (PRILOSEC) 40 MG capsule Take 1 capsule (40 mg total) by mouth 2 (two) times daily. 60 capsule 1  . ondansetron (ZOFRAN ODT) 4 MG disintegrating tablet Take 1 tablet (4 mg total) by mouth every 8 (eight) hours as needed for nausea or vomiting. 15 tablet 0  . ranitidine (ZANTAC) 150 MG tablet  Take 1 tablet (150 mg total) by mouth 2 (two) times daily. 30 tablet 0  . spironolactone (ALDACTONE) 25 MG tablet TAKE ONE TABLET BY MOUTH ONCE DAILY 90 tablet 2   No current facility-administered medications for this visit.     Allergies:   Ivp dye [iodinated diagnostic agents]; Penicillins; Sulfonamide derivatives; Tessalon [benzonatate]; Hydrocodone-acetaminophen; Penicillin g; Rosuvastatin calcium; and Onion    Social History:  The patient  reports that she has never smoked. She has never used smokeless tobacco. She reports that she does not drink alcohol or use drugs.   Family History:  The patient's ***family history includes Atrial fibrillation in her brother and mother; Congestive Heart Failure in her brother, father, and mother; Deafness in her father and mother; Healthy in her sister, sister, and sister; Kidney Stones in her mother; Tuberculosis in her paternal grandfather.    ROS:  Please see the history of present illness.   Otherwise, review of systems are positive for ***.   All other systems are reviewed and negative.    PHYSICAL EXAM: VS:  There were no vitals taken for this visit. , BMI There is no height or weight on file to calculate BMI. GEN: Well nourished, well developed, in no acute distress HEENT: normal Neck: no JVD, carotid bruits, or masses Cardiac: ***RRR; no murmurs, rubs, or gallops,no edema  Respiratory:  clear to auscultation bilaterally, normal work of breathing GI: soft, nontender, nondistended, + BS MS: no deformity or atrophy Skin: warm and dry, no rash Neuro:  Strength and sensation are intact Psych: euthymic mood, full affect   EKG:   The ekg ordered today demonstrates ***   Recent Labs: 04/05/2016: ALT 15; B Natriuretic Peptide 65.5; BUN 13; Creatinine, Ser 0.86; Potassium 4.1; Sodium 138 01/26/2017: Hemoglobin 13.6; Platelets 245.0   Lipid Panel No results found for: CHOL, TRIG, HDL, CHOLHDL, VLDL, LDLCALC, LDLDIRECT   Other studies  Reviewed: Additional studies/ records that were reviewed today with results demonstrating: ***.   ASSESSMENT AND PLAN:  1. Chronic systolic heart failure:  2. *** 3. ***   Current medicines are reviewed at length with the patient today.  The patient concerns regarding her medicines were addressed.  The following changes have been made:  No change***  Labs/ tests ordered today include: *** No orders of the defined types were placed in this encounter.   Recommend 150 minutes/week of aerobic exercise Low fat, low carb, high fiber diet recommended  Disposition:   FU in ***   Signed, Larae Grooms, MD  01/31/2017 6:25 PM    [address],  [place]  [address] Phone: [phone]; Fax: [phone]

## 2017-02-01 ENCOUNTER — Ambulatory Visit: Payer: Medicare HMO | Admitting: Interventional Cardiology

## 2017-02-03 ENCOUNTER — Other Ambulatory Visit: Payer: Self-pay

## 2017-02-03 DIAGNOSIS — R0602 Shortness of breath: Secondary | ICD-10-CM

## 2017-02-03 MED ORDER — FLUTICASONE FUROATE-VILANTEROL 200-25 MCG/INH IN AEPB: 1.0000 | INHALATION_SPRAY | Freq: Every day | RESPIRATORY_TRACT | 6 refills | Status: DC

## 2017-02-08 ENCOUNTER — Other Ambulatory Visit: Payer: Self-pay | Admitting: Interventional Cardiology

## 2017-02-09 DIAGNOSIS — M79642 Pain in left hand: Secondary | ICD-10-CM | POA: Diagnosis not present

## 2017-02-14 DIAGNOSIS — M79642 Pain in left hand: Secondary | ICD-10-CM | POA: Diagnosis not present

## 2017-02-14 DIAGNOSIS — M1711 Unilateral primary osteoarthritis, right knee: Secondary | ICD-10-CM | POA: Diagnosis not present

## 2017-02-14 DIAGNOSIS — M1712 Unilateral primary osteoarthritis, left knee: Secondary | ICD-10-CM | POA: Diagnosis not present

## 2017-02-23 ENCOUNTER — Encounter: Payer: Self-pay | Admitting: Interventional Cardiology

## 2017-02-23 ENCOUNTER — Ambulatory Visit (INDEPENDENT_AMBULATORY_CARE_PROVIDER_SITE_OTHER): Payer: Medicare HMO | Admitting: *Deleted

## 2017-02-23 ENCOUNTER — Ambulatory Visit (INDEPENDENT_AMBULATORY_CARE_PROVIDER_SITE_OTHER): Payer: Medicare HMO | Admitting: Interventional Cardiology

## 2017-02-23 VITALS — BP 130/62 | HR 78 | Ht 63.0 in | Wt 193.8 lb

## 2017-02-23 DIAGNOSIS — Z95810 Presence of automatic (implantable) cardiac defibrillator: Secondary | ICD-10-CM

## 2017-02-23 DIAGNOSIS — R600 Localized edema: Secondary | ICD-10-CM

## 2017-02-23 DIAGNOSIS — I428 Other cardiomyopathies: Secondary | ICD-10-CM | POA: Diagnosis not present

## 2017-02-23 DIAGNOSIS — I5022 Chronic systolic (congestive) heart failure: Secondary | ICD-10-CM | POA: Diagnosis not present

## 2017-02-23 DIAGNOSIS — I10 Essential (primary) hypertension: Secondary | ICD-10-CM

## 2017-02-23 NOTE — Progress Notes (Signed)
Cardiology Office Note   Date:  02/23/2017   ID:  Paula Booker, DOB March 25, 1946, MRN [id number]  PCP:  Paula Dials, MD    No chief complaint on file.  NICM  Wt Readings from Last 3 Encounters:  02/23/17 193 lb 12.8 oz (87.9 kg)  01/26/17 199 lb (90.3 kg)  09/20/16 209 lb (94.8 kg)       History of Present Illness: Paula Booker is a 71 y.o. female  who has a nonischemic cardiomyopathy. She has had WPW diagnosed on ECG as well by Dr. Lovena Booker. She has had ICD implant and EP study for WPW with no inducible arrhythmia.  Hospitalized in Dec 2016 for sepsis, UTI, pneumonia.  SHe was in the hospital for about a week.    She has routine EP f/u on AICD.  She is taking her Lasix regularly.  If she skips it, she gets a call about fluid retention.  Denies : Chest pain. Dizziness. Leg edema. Nitroglycerin use. Orthopnea. Palpitations. Paroxysmal nocturnal dyspnea. Shortness of breath. Syncope.   She is walking regularly and has lost nearly 20 lbs.  She has decreased fried food intake.  She heats salads.  She avoids using slat as well.    BP has been controlled at home as well.  814-481 systolic.    Past Medical History:  Diagnosis Date  . Anxiety   . Arthritis    "hands and knees" (03/22/2012)  . Asthma   . CHF (congestive heart failure) ([place]) 6/13  . Depression   . GERD (gastroesophageal reflux disease)   . Head injury, acute, with loss of consciousness ([facility])   . Hypertension   . ICD (implantable cardiac defibrillator) in place Dec 2013  . Migraines   . NICM (nonischemic cardiomyopathy) ([facility])   . Obesity (BMI 30-39.9)    negative sleep study 2014  . Pacemaker   . Pneumonia    hx  . Shortness of breath    "@ any time I can get SOB" (03/22/2012)  . Skin cancer 1999  . WPW (Wolff-Parkinson-White syndrome)     Past Surgical History:  Procedure Laterality Date  . CARDIAC CATHETERIZATION  09/18/11   no significant CAD  . CARDIAC DEFIBRILLATOR PLACEMENT   03/22/2012   DDD  . CHOLECYSTECTOMY  ~ 2003  . IMPLANTABLE CARDIOVERTER DEFIBRILLATOR IMPLANT N/A 03/22/2012   Procedure: IMPLANTABLE CARDIOVERTER DEFIBRILLATOR IMPLANT;  Surgeon: Paula Lance, MD;  Location: [facility];  Service: Cardiovascular;  Laterality: N/A;  . KYPHOPLASTY N/A 11/27/2013   Procedure: KYPHOPLASTY;  Surgeon: Paula Ship, MD;  Location: [facility];  Service: Orthopedics;  Laterality: N/A;  T9, T11 kyphoplasty  . KYPHOPLASTY N/A 06/05/2014   Procedure: KYPHOPLASTY;  Surgeon: Paula Ship, MD;  Location: [facility];  Service: Orthopedics;  Laterality: N/A;  T7 kyphoplasty  . MOHS SURGERY  06/2011   "forehead" (03/22/2012)  . SKIN CANCER EXCISION  1999   "top of my head and my right back shoulder"  . SKIN CANCER EXCISION     back  . SKIN CANCER EXCISION     face  . SUPRAVENTRICULAR TACHYCARDIA ABLATION  03/22/2012   unable to induce VT/RN report 03/22/2012  . SUPRAVENTRICULAR TACHYCARDIA ABLATION N/A 03/22/2012   Procedure: SUPRAVENTRICULAR TACHYCARDIA ABLATION;  Surgeon: Paula Lance, MD;  [facility]: [facility];  Service: Cardiovascular;  Laterality: N/A;  . TONSILLECTOMY AND ADENOIDECTOMY  1950  . TUBAL LIGATION  1978     Current Outpatient Medications  Medication Sig Dispense Refill  .  albuterol (PROVENTIL HFA;VENTOLIN HFA) 108 (90 BASE) MCG/ACT inhaler Inhale 2 puffs into the lungs every 6 (six) hours as needed. For wheezing    . alendronate (FOSAMAX) 70 MG tablet Take 70 mg by mouth once a week. Take with a full glass of water on an empty stomach.    . carvedilol (COREG) 12.5 MG tablet TAKE ONE TABLET BY MOUTH TWICE DAILY WITH A MEAL 180 tablet 3  . cetirizine (ZYRTEC) 10 MG tablet Take 10 mg by mouth daily.    . Cholecalciferol (VITAMIN D3) 1000 UNITS CAPS Take 1,000 Units by mouth daily.     . citalopram (CELEXA) 20 MG tablet Take 20 mg by mouth daily.     . fluticasone furoate-vilanterol (BREO ELLIPTA) 200-25 MCG/INH AEPB Inhale 1 puff into the  lungs daily. 1 each 6  . furosemide (LASIX) 40 MG tablet TAKE ONE TO TWO TABLETS BY MOUTH DAILY AS DIRECTED FOR EDEMA, SHORTNESS OF BREATH AND/OR WEIGHT GAIN. 180 tablet 0  . Gelatin 600 MG CAPS Take 1,300 mg by mouth 2 (two) times daily.    Marland Kitchen losartan (COZAAR) 25 MG tablet Take 25 mg by mouth 2 (two) times daily.    Marland Kitchen omeprazole (PRILOSEC) 40 MG capsule Take 1 capsule (40 mg total) by mouth 2 (two) times daily. 60 capsule 1  . ondansetron (ZOFRAN ODT) 4 MG disintegrating tablet Take 1 tablet (4 mg total) by mouth every 8 (eight) hours as needed for nausea or vomiting. 15 tablet 0  . ranitidine (ZANTAC) 150 MG tablet Take 1 tablet (150 mg total) by mouth 2 (two) times daily. 30 tablet 0  . spironolactone (ALDACTONE) 25 MG tablet TAKE ONE TABLET BY MOUTH ONCE DAILY 90 tablet 0   No current facility-administered medications for this visit.     Allergies:   Ivp dye [iodinated diagnostic agents]; Penicillins; Sulfonamide derivatives; Tessalon [benzonatate]; Sulfa antibiotics; Hydrocodone-acetaminophen; Penicillin g; Rosuvastatin calcium; and Onion    Social History:  The patient  reports that  has never smoked. she has never used smokeless tobacco. She reports that she does not drink alcohol or use drugs.   Family History:  The patient's family history includes Atrial fibrillation in her brother and mother; Congestive Heart Failure in her brother, father, and mother; Deafness in her father and mother; Healthy in her sister, sister, and sister; Kidney Stones in her mother; Tuberculosis in her paternal grandfather.    ROS:  Please see the history of present illness.   Otherwise, review of systems are positive for intentional weight loss.   All other systems are reviewed and negative.    PHYSICAL EXAM: VS:  BP 130/62   Pulse 78   Ht 5\' 3"  (1.6 m)   Wt 193 lb 12.8 oz (87.9 kg)   SpO2 95%   BMI 34.33 kg/m  , BMI Body mass index is 34.33 kg/m. GEN: Well nourished, well developed, in no acute  distress  HEENT: normal  Neck: no JVD, carotid bruits, or masses Cardiac: RRR; no murmurs, rubs, or gallops,no edema  Respiratory:  clear to auscultation bilaterally, normal work of breathing GI: soft, nontender, nondistended, + BS MS: no deformity or atrophy  Skin: warm and dry, no rash Neuro:  Strength and sensation are intact Psych: euthymic mood, full affect   EKG:   The ekg ordered today demonstrates NSR, LBBB   Recent Labs: 04/05/2016: ALT 15; B Natriuretic Peptide 65.5; BUN 13; Creatinine, Ser 0.86; Potassium 4.1; Sodium 138 01/26/2017: Hemoglobin 13.6; Platelets 245.0  Lipid Panel No results found for: CHOL, TRIG, HDL, CHOLHDL, VLDL, LDLCALC, LDLDIRECT   Other studies Reviewed: Additional studies/ records that were reviewed today with results demonstrating: EF 20-25% in 2016.   ASSESSMENT AND PLAN:  1. Chronic systolic heart failure/NICM: Well compensated.  She appears euvolemic.  Continue current management with furosemide and losartan.  She was concerned about the recall.   We went over the ofice email about the recall from our PharmD.   Since the losartan does not contain any HCTZ, her brand of losartan is not recalled. 2. HTN: Blood pressure well controlled.  Continue current medicines. 3. Leg edema: Resolved.  Continue Lasix.  Continue to elevate legs. 4. Continue routine device checks with EP.   Current medicines are reviewed at length with the patient today.  The patient concerns regarding her medicines were addressed.  The following changes have been made:  No change  Labs/ tests ordered today include:  No orders of the defined types were placed in this encounter.   Recommend 150 minutes/week of aerobic exercise Low fat, low carb, high fiber diet recommended  Disposition:   FU in 1 year   Signed, Larae Grooms, MD  02/23/2017 2:43 PM    [facility] [place], [place], [place]  [address] Phone: [phone]; Fax:  [phone]

## 2017-02-23 NOTE — Progress Notes (Signed)
Remote ICD transmission.   

## 2017-02-23 NOTE — Patient Instructions (Signed)

## 2017-02-24 LAB — CUP PACEART REMOTE DEVICE CHECK
Battery Remaining Longevity: 73 mo
Brady Statistic AP VS Percent: 24.62 %
Brady Statistic AS VP Percent: 0.03 %
Brady Statistic AS VS Percent: 75.32 %
Brady Statistic RV Percent Paced: 0.06 %
HIGH POWER IMPEDANCE MEASURED VALUE: 92 Ohm
Implantable Lead Implant Date: 20131212
Implantable Lead Location: 753859
Implantable Lead Model: 5076
Lead Channel Impedance Value: 513 Ohm
Lead Channel Impedance Value: 665 Ohm
Lead Channel Pacing Threshold Amplitude: 0.5 V
Lead Channel Pacing Threshold Amplitude: 0.5 V
Lead Channel Pacing Threshold Pulse Width: 0.4 ms
Lead Channel Sensing Intrinsic Amplitude: 3.375 mV
Lead Channel Sensing Intrinsic Amplitude: 9.625 mV
Lead Channel Setting Pacing Amplitude: 2.5 V
Lead Channel Setting Pacing Pulse Width: 0.4 ms
Lead Channel Setting Sensing Sensitivity: 0.3 mV
MDC IDC LEAD IMPLANT DT: 20131212
MDC IDC LEAD LOCATION: 753860
MDC IDC MSMT BATTERY VOLTAGE: 2.99 V
MDC IDC MSMT LEADCHNL RA PACING THRESHOLD PULSEWIDTH: 0.4 ms
MDC IDC MSMT LEADCHNL RA SENSING INTR AMPL: 3.375 mV
MDC IDC MSMT LEADCHNL RV IMPEDANCE VALUE: 817 Ohm
MDC IDC MSMT LEADCHNL RV SENSING INTR AMPL: 9.625 mV
MDC IDC PG IMPLANT DT: 20131212
MDC IDC SESS DTM: 20181115072726
MDC IDC SET LEADCHNL RA PACING AMPLITUDE: 2 V
MDC IDC STAT BRADY AP VP PERCENT: 0.03 %
MDC IDC STAT BRADY RA PERCENT PACED: 23.51 %

## 2017-02-24 NOTE — Progress Notes (Signed)
EPIC Encounter for ICM Monitoring  Patient Name: Paula Booker is a 71 y.o. female Date: 02/24/2017 Primary Care Physican: No primary care provider on file. Primary [place] Electrophysiologist: Lovena Le Dry Weight:193lbs       Heart Failure questions reviewed, pt asymptomatic.   Thoracic impedance normal but was abnormal suggesting fluid accumulation from 02/02/2017 to 02/16/2017.  Prescribed dosage: Furosemide 40 mg 1 to 2 tablets daily as directed for edema, SOB and/or weigh gain.   Recommendations: No changes.  Encouraged to call for fluid symptoms.  Follow-up plan: ICM clinic phone appointment on 03/27/2017.    Copy of ICM check sent to Dr. Lovena Le.   3 month ICM trend: 02/23/2017    1 Year ICM trend:       [name], RN 02/24/2017 2:17 PM

## 2017-03-01 ENCOUNTER — Encounter: Payer: Self-pay | Admitting: Cardiology

## 2017-03-22 ENCOUNTER — Other Ambulatory Visit: Payer: Self-pay | Admitting: Interventional Cardiology

## 2017-03-27 ENCOUNTER — Ambulatory Visit (INDEPENDENT_AMBULATORY_CARE_PROVIDER_SITE_OTHER): Payer: Medicare HMO

## 2017-03-27 DIAGNOSIS — Z95810 Presence of automatic (implantable) cardiac defibrillator: Secondary | ICD-10-CM

## 2017-03-27 DIAGNOSIS — I5022 Chronic systolic (congestive) heart failure: Secondary | ICD-10-CM | POA: Diagnosis not present

## 2017-03-27 NOTE — Progress Notes (Signed)
EPIC Encounter for ICM Monitoring  Patient Name: Paula Booker is a 71 y.o. female Date: 03/27/2017 Primary Care Physican: No primary care provider on file. Primary [place] Electrophysiologist: Lovena Le Dry Weight:Previous weight 193lbs            Attempted call to patient and unable to reach.    Transmission reviewed.    Thoracic impedance starting downward trending suggesting fluid accumulation for the last 2 days  Prescribed dosage: Furosemide 40 mg 1 to 2 tablets daily as directed for edema, SOB and/or weigh gain.  Recommendations: NONE - Unable to reach.  Follow-up plan: ICM clinic phone appointment on 04/14/2017 to recheck fluid levels.  Office appointment scheduled 05/04/2017 with Dr. Lovena Le.  Copy of ICM check sent to Dr. Lovena Le and Dr. Irish Lack.   3 month ICM trend: 03/27/2017    1 Year ICM trend:       [name], RN 03/27/2017 8:23 AM

## 2017-03-28 ENCOUNTER — Telehealth: Payer: Self-pay

## 2017-03-28 NOTE — Telephone Encounter (Signed)
Remote ICM transmission received.  Attempted call to patient and answer or answering machine.

## 2017-04-14 ENCOUNTER — Telehealth: Payer: Self-pay

## 2017-04-14 ENCOUNTER — Ambulatory Visit (INDEPENDENT_AMBULATORY_CARE_PROVIDER_SITE_OTHER): Payer: Self-pay

## 2017-04-14 DIAGNOSIS — I5022 Chronic systolic (congestive) heart failure: Secondary | ICD-10-CM

## 2017-04-14 DIAGNOSIS — Z95810 Presence of automatic (implantable) cardiac defibrillator: Secondary | ICD-10-CM

## 2017-04-14 NOTE — Telephone Encounter (Signed)
Remote ICM transmission received.  Attempted call to patient and no answer or answering machine.  

## 2017-04-14 NOTE — Progress Notes (Signed)
EPIC Encounter for ICM Monitoring  Patient Name: Paula Booker is a 72 y.o. female Date: 04/14/2017 Primary Care Physican: Patient, No Pcp Per Primary [place] Electrophysiologist: Lovena Le Dry Weight:Previous weight 193lbs      Attempted call to patient and unable to reach.   Transmission reviewed.    Thoracic impedance returned to normal since last ICM transmission on 03/27/2017.   Prescribed dosage: Furosemide 40 mg 1 to 2 tablets daily as directed for edema, SOB and/or weigh gain.  Recommendations: NONE - Unable to reach.  Follow-up plan: ICM clinic phone appointment on 06/05/2017.  Office appointment scheduled 05/04/2017 with Dr. Lovena Le.  Copy of ICM check sent to Dr. Lovena Le.   3 month ICM trend: 04/14/2017    1 Year ICM trend:       [name], RN 04/14/2017 11:45 AM

## 2017-04-28 ENCOUNTER — Ambulatory Visit: Payer: Medicare HMO | Admitting: Pulmonary Disease

## 2017-05-04 ENCOUNTER — Ambulatory Visit (INDEPENDENT_AMBULATORY_CARE_PROVIDER_SITE_OTHER): Payer: Medicare HMO | Admitting: Internal Medicine

## 2017-05-04 ENCOUNTER — Encounter: Payer: Self-pay | Admitting: Internal Medicine

## 2017-05-04 VITALS — BP 122/76 | HR 80 | Resp 16 | Ht 63.0 in | Wt 195.0 lb

## 2017-05-04 DIAGNOSIS — I5022 Chronic systolic (congestive) heart failure: Secondary | ICD-10-CM

## 2017-05-04 DIAGNOSIS — Z95810 Presence of automatic (implantable) cardiac defibrillator: Secondary | ICD-10-CM | POA: Diagnosis not present

## 2017-05-04 DIAGNOSIS — I428 Other cardiomyopathies: Secondary | ICD-10-CM

## 2017-05-04 LAB — CUP PACEART INCLINIC DEVICE CHECK
Battery Remaining Longevity: 69 mo
Battery Voltage: 2.98 V
Brady Statistic AP VP Percent: 0.02 %
Brady Statistic AS VS Percent: 83.1 %
Brady Statistic RV Percent Paced: 0.05 %
Date Time Interrogation Session: 20190124144708
HIGH POWER IMPEDANCE MEASURED VALUE: 89 Ohm
Implantable Lead Implant Date: 20131212
Implantable Lead Implant Date: 20131212
Implantable Lead Location: 753859
Implantable Pulse Generator Implant Date: 20131212
Lead Channel Impedance Value: 703 Ohm
Lead Channel Impedance Value: 893 Ohm
Lead Channel Pacing Threshold Amplitude: 0.5 V
Lead Channel Pacing Threshold Pulse Width: 0.4 ms
Lead Channel Pacing Threshold Pulse Width: 0.4 ms
Lead Channel Sensing Intrinsic Amplitude: 4.375 mV
Lead Channel Setting Pacing Amplitude: 2 V
Lead Channel Setting Pacing Amplitude: 2.5 V
Lead Channel Setting Pacing Pulse Width: 0.4 ms
Lead Channel Setting Sensing Sensitivity: 0.3 mV
MDC IDC LEAD LOCATION: 753860
MDC IDC MSMT LEADCHNL RA IMPEDANCE VALUE: 475 Ohm
MDC IDC MSMT LEADCHNL RV PACING THRESHOLD AMPLITUDE: 0.5 V
MDC IDC MSMT LEADCHNL RV SENSING INTR AMPL: 9.125 mV
MDC IDC STAT BRADY AP VS PERCENT: 16.84 %
MDC IDC STAT BRADY AS VP PERCENT: 0.03 %
MDC IDC STAT BRADY RA PERCENT PACED: 16.28 %

## 2017-05-04 NOTE — Patient Instructions (Addendum)
Medication Instructions:  Your physician recommends that you continue on your current medications as directed. Please refer to the Current Medication list given to you today.  Labwork: None ordered.  Testing/Procedures: None ordered.  Follow-Up: Your physician wants you to follow-up in: one year with Dr. Lovena Le.   You will receive a reminder letter in the mail two months in advance. If you don't receive a letter, please call our office to schedule the follow-up appointment.  Remote monitoring is used to monitor your ICD from home. This monitoring reduces the number of office visits required to check your device to one time per year. It allows [place] to keep an eye on the functioning of your device to ensure it is working properly. You are scheduled for a device check from home on 06/05/2017. You may send your transmission at any time that day. If you have a wireless device, the transmission will be sent automatically. After your physician reviews your transmission, you will receive a postcard with your next transmission date.  Any Other Special Instructions Will Be Listed Below (If Applicable).  If you need a refill on your cardiac medications before your next appointment, please call your pharmacy.

## 2017-05-04 NOTE — Progress Notes (Signed)
HPI [name] returns today for followup. She is a pleasant 72 yo woman with a non-ischemic CM, chronic systolic heart failure, syncope and a WPW pattern on her ECG who underwent EP study with no inducible arrhythmias followed by ICD implant 2 years ago. She has become more sedentary. She denies any ICD shocks.  No other complaints. No peripheral edema. She remains active. She is frustrated by her ability not to lose weight.  Allergies  Allergen Reactions  . Ivp Dye [Iodinated Diagnostic Agents] Anaphylaxis, Hives, Swelling and Other (See Comments)    Throat swell  . Penicillins Anaphylaxis    Has patient had a PCN reaction causing immediate rash, facial/tongue/throat swelling, SOB or lightheadedness with hypotension: No Has patient had a PCN reaction causing severe rash involving mucus membranes or skin necrosis: No Has patient had a PCN reaction that required hospitalization No Has patient had a PCN reaction occurring within the last 10 years:No If all of the above answers are "NO", then may proceed with Cephalosporin use.  . Sulfonamide Derivatives Other (See Comments)    Migraines  . Tessalon [Benzonatate] Rash and Hives    Severe rash  . Sulfa Antibiotics Other (See Comments)    Migraine  . Hydrocodone-Acetaminophen Other (See Comments)  . Penicillin G Other (See Comments)  . Rosuvastatin Calcium Other (See Comments)  . Onion Other (See Comments)    Raw onions cause migraines     Current Outpatient Medications  Medication Sig Dispense Refill  . albuterol (PROVENTIL HFA;VENTOLIN HFA) 108 (90 BASE) MCG/ACT inhaler Inhale 2 puffs into the lungs every 6 (six) hours as needed. For wheezing    . alendronate (FOSAMAX) 70 MG tablet Take 70 mg by mouth once a week. Take with a full glass of water on an empty stomach.    . carvedilol (COREG) 12.5 MG tablet TAKE ONE TABLET BY MOUTH TWICE DAILY WITH A MEAL 180 tablet 3  . cetirizine (ZYRTEC) 10 MG tablet Take 10 mg by mouth daily.     . Cholecalciferol (VITAMIN D3) 1000 UNITS CAPS Take 1,000 Units by mouth daily.     . citalopram (CELEXA) 20 MG tablet Take 20 mg by mouth daily.     . fluticasone furoate-vilanterol (BREO ELLIPTA) 200-25 MCG/INH AEPB Inhale 1 puff into the lungs daily. 1 each 6  . furosemide (LASIX) 40 MG tablet TAKE ONE TO TWO TABLETS BY MOUTH DAILY AS DIRECTED FOR EDEMA, SHORTNESS OF BREATH AND/OR WEIGHT GAIN. 180 tablet 0  . Gelatin 600 MG CAPS Take 1,300 mg by mouth 2 (two) times daily.    Marland Kitchen losartan (COZAAR) 25 MG tablet TAKE 1 TABLET BY MOUTH TWICE DAILY 180 tablet 3  . ondansetron (ZOFRAN ODT) 4 MG disintegrating tablet Take 1 tablet (4 mg total) by mouth every 8 (eight) hours as needed for nausea or vomiting. 15 tablet 0  . ranitidine (ZANTAC) 150 MG tablet Take 1 tablet (150 mg total) by mouth 2 (two) times daily. 30 tablet 0  . spironolactone (ALDACTONE) 25 MG tablet TAKE ONE TABLET BY MOUTH ONCE DAILY 90 tablet 0   No current facility-administered medications for this visit.      Past Medical History:  Diagnosis Date  . Anxiety   . Arthritis    "hands and knees" (03/22/2012)  . Asthma   . CHF (congestive heart failure) ([place]) 6/13  . Depression   . GERD (gastroesophageal reflux disease)   . Head injury, acute, with loss of consciousness ([place])   .  Hypertension   . ICD (implantable cardiac defibrillator) in place Dec 2013  . Migraines   . NICM (nonischemic cardiomyopathy) ([place])   . Obesity (BMI 30-39.9)    negative sleep study 2014  . Pacemaker   . Pneumonia    hx  . Shortness of breath    "@ any time I can get SOB" (03/22/2012)  . Skin cancer 1999  . WPW (Wolff-Parkinson-White syndrome)     ROS:   All systems reviewed and negative except as noted in the HPI.   Past Surgical History:  Procedure Laterality Date  . CARDIAC CATHETERIZATION  09/18/11   no significant CAD  . CARDIAC DEFIBRILLATOR PLACEMENT  03/22/2012   DDD  . CHOLECYSTECTOMY  ~ 2003  . IMPLANTABLE  CARDIOVERTER DEFIBRILLATOR IMPLANT N/A 03/22/2012   Procedure: IMPLANTABLE CARDIOVERTER DEFIBRILLATOR IMPLANT;  Surgeon: Evans Lance, MD;  Location: [facility] CATH LAB;  Service: Cardiovascular;  Laterality: N/A;  . KYPHOPLASTY N/A 11/27/2013   Procedure: KYPHOPLASTY;  Surgeon: Sinclair Ship, MD;  Location: [place];  Service: Orthopedics;  Laterality: N/A;  T9, T11 kyphoplasty  . KYPHOPLASTY N/A 06/05/2014   Procedure: KYPHOPLASTY;  Surgeon: Sinclair Ship, MD;  Location: [place];  Service: Orthopedics;  Laterality: N/A;  T7 kyphoplasty  . MOHS SURGERY  06/2011   "forehead" (03/22/2012)  . SKIN CANCER EXCISION  1999   "top of my head and my right back shoulder"  . SKIN CANCER EXCISION     back  . SKIN CANCER EXCISION     face  . SUPRAVENTRICULAR TACHYCARDIA ABLATION  03/22/2012   unable to induce VT/RN report 03/22/2012  . SUPRAVENTRICULAR TACHYCARDIA ABLATION N/A 03/22/2012   Procedure: SUPRAVENTRICULAR TACHYCARDIA ABLATION;  Surgeon: Evans Lance, MD;  Location: [facility] CATH LAB;  Service: Cardiovascular;  Laterality: N/A;  . TONSILLECTOMY AND ADENOIDECTOMY  1950  . TUBAL LIGATION  1978     Family History  Problem Relation Age of Onset  . Congestive Heart Failure Brother   . Atrial fibrillation Brother   . Congestive Heart Failure Mother        died 57  . Kidney Stones Mother   . Deafness Mother   . Atrial fibrillation Mother   . Congestive Heart Failure Father   . Deafness Father   . Healthy Sister   . Healthy Sister   . Healthy Sister   . Tuberculosis Paternal Grandfather      Social History   Socioeconomic History  . Marital status: Divorced    Spouse name: Not on file  . Number of children: 3  . Years of education: Not on file  . Highest education level: Not on file  Social Needs  . Financial resource strain: Not on file  . Food insecurity - worry: Not on file  . Food insecurity - inability: Not on file  . Transportation needs - medical: Not on file  .  Transportation needs - non-medical: Not on file  Occupational History  . Occupation: retired    Comment: Optometrist  Tobacco Use  . Smoking status: Never Smoker  . Smokeless tobacco: Never Used  Substance and Sexual Activity  . Alcohol use: No  . Drug use: No  . Sexual activity: Not on file  Other Topics Concern  . Not on file  Social History Narrative   Divorced, 3 children, 9 grandchildren     BP 122/76   Pulse 80   Resp 16   Ht 5\' 3"  (1.6 m)   Wt 195 lb (  88.5 kg)   SpO2 98%   BMI 34.54 kg/m   Physical Exam:  Well appearing 72 yo woman, NAD HEENT: Unremarkable Neck:  No JVD, no thyromegally Lymphatics:  No adenopathy Back:  No CVA tenderness Lungs:  Clear with no wheezes HEART:  Regular rate rhythm, no murmurs, no rubs, no clicks Abd:  soft, positive bowel sounds, no organomegally, no rebound, no guarding Ext:  2 plus pulses, no edema, no cyanosis, no clubbing Skin:  No rashes no nodules Neuro:  CN II through XII intact, motor grossly intact  EKG - NSR with LBBB  DEVICE  Normal device function.  See PaceArt for details.   Assess/Plan: 1. Chronic systolic heart failure - her symptoms are class 1. She will continue her current meds. 2. VT - she has only had NSVT 3. SVT - she had one episode which was self limited. No change in meds. 4. ICD - her medtronic device is working normally. Will recheck in several months.  Paula Booker.D.

## 2017-05-05 ENCOUNTER — Other Ambulatory Visit: Payer: Self-pay | Admitting: Interventional Cardiology

## 2017-05-08 ENCOUNTER — Ambulatory Visit: Payer: Medicare HMO | Admitting: Pulmonary Disease

## 2017-05-12 DIAGNOSIS — H5213 Myopia, bilateral: Secondary | ICD-10-CM | POA: Diagnosis not present

## 2017-05-12 DIAGNOSIS — H52209 Unspecified astigmatism, unspecified eye: Secondary | ICD-10-CM | POA: Diagnosis not present

## 2017-05-12 DIAGNOSIS — H524 Presbyopia: Secondary | ICD-10-CM | POA: Diagnosis not present

## 2017-05-16 ENCOUNTER — Other Ambulatory Visit: Payer: Self-pay | Admitting: Interventional Cardiology

## 2017-06-05 ENCOUNTER — Ambulatory Visit (INDEPENDENT_AMBULATORY_CARE_PROVIDER_SITE_OTHER): Payer: Medicare HMO | Admitting: *Deleted

## 2017-06-05 DIAGNOSIS — I428 Other cardiomyopathies: Secondary | ICD-10-CM

## 2017-06-05 DIAGNOSIS — Z95810 Presence of automatic (implantable) cardiac defibrillator: Secondary | ICD-10-CM | POA: Diagnosis not present

## 2017-06-05 DIAGNOSIS — I5022 Chronic systolic (congestive) heart failure: Secondary | ICD-10-CM

## 2017-06-05 NOTE — Progress Notes (Signed)
Remote ICD transmission.   

## 2017-06-05 NOTE — Progress Notes (Signed)
EPIC Encounter for ICM Monitoring  Patient Name: Paula Booker is a 72 y.o. female Date: 06/05/2017 Primary Care Physican: Patient, No Pcp Per Primary [place] Electrophysiologist: Lovena Le Dry Weight:190lbs       Heart Failure questions reviewed, pt has slightly puffy feet but she ate a pizza last night.    Thoracic impedance normal.  Prescribed dosage: Furosemide 40 mg 1 to 2 tablets daily as directed for edema, SOB and/or weigh gain.  Recommendations: No changes.  Encouraged to call for fluid symptoms.  Follow-up plan: ICM clinic phone appointment on 07/06/2017.    Copy of ICM check sent to Dr. Lovena Le.   3 month ICM trend: 06/05/2017    1 Year ICM trend:       [name], RN 06/05/2017 9:27 AM

## 2017-06-07 DIAGNOSIS — R200 Anesthesia of skin: Secondary | ICD-10-CM | POA: Diagnosis not present

## 2017-06-08 ENCOUNTER — Encounter: Payer: Self-pay | Admitting: Cardiology

## 2017-06-14 DIAGNOSIS — H2513 Age-related nuclear cataract, bilateral: Secondary | ICD-10-CM | POA: Diagnosis not present

## 2017-06-14 DIAGNOSIS — H02823 Cysts of right eye, unspecified eyelid: Secondary | ICD-10-CM | POA: Diagnosis not present

## 2017-06-14 DIAGNOSIS — H02821 Cysts of right upper eyelid: Secondary | ICD-10-CM | POA: Diagnosis not present

## 2017-06-20 ENCOUNTER — Other Ambulatory Visit: Payer: Self-pay | Admitting: Pulmonary Disease

## 2017-06-20 ENCOUNTER — Other Ambulatory Visit: Payer: Self-pay | Admitting: Internal Medicine

## 2017-06-20 DIAGNOSIS — M1711 Unilateral primary osteoarthritis, right knee: Secondary | ICD-10-CM | POA: Diagnosis not present

## 2017-06-20 DIAGNOSIS — M1712 Unilateral primary osteoarthritis, left knee: Secondary | ICD-10-CM | POA: Diagnosis not present

## 2017-06-20 DIAGNOSIS — I428 Other cardiomyopathies: Secondary | ICD-10-CM

## 2017-06-20 LAB — CUP PACEART REMOTE DEVICE CHECK
Battery Remaining Longevity: 66 mo
Brady Statistic AS VP Percent: 0.03 %
Brady Statistic RA Percent Paced: 16.66 %
Date Time Interrogation Session: 20190225072610
HIGH POWER IMPEDANCE MEASURED VALUE: 92 Ohm
Implantable Lead Implant Date: 20131212
Implantable Lead Location: 753860
Implantable Lead Model: 5076
Lead Channel Impedance Value: 836 Ohm
Lead Channel Pacing Threshold Amplitude: 0.5 V
Lead Channel Sensing Intrinsic Amplitude: 12.875 mV
Lead Channel Sensing Intrinsic Amplitude: 3.5 mV
Lead Channel Setting Pacing Amplitude: 2.5 V
Lead Channel Setting Pacing Pulse Width: 0.4 ms
Lead Channel Setting Sensing Sensitivity: 0.3 mV
MDC IDC LEAD IMPLANT DT: 20131212
MDC IDC LEAD LOCATION: 753859
MDC IDC MSMT BATTERY VOLTAGE: 2.98 V
MDC IDC MSMT LEADCHNL RA IMPEDANCE VALUE: 513 Ohm
MDC IDC MSMT LEADCHNL RA PACING THRESHOLD AMPLITUDE: 0.5 V
MDC IDC MSMT LEADCHNL RA PACING THRESHOLD PULSEWIDTH: 0.4 ms
MDC IDC MSMT LEADCHNL RA SENSING INTR AMPL: 3.5 mV
MDC IDC MSMT LEADCHNL RV IMPEDANCE VALUE: 665 Ohm
MDC IDC MSMT LEADCHNL RV PACING THRESHOLD PULSEWIDTH: 0.4 ms
MDC IDC MSMT LEADCHNL RV SENSING INTR AMPL: 12.875 mV
MDC IDC PG IMPLANT DT: 20131212
MDC IDC SET LEADCHNL RA PACING AMPLITUDE: 2 V
MDC IDC STAT BRADY AP VP PERCENT: 0.03 %
MDC IDC STAT BRADY AP VS PERCENT: 17.47 %
MDC IDC STAT BRADY AS VS PERCENT: 82.47 %
MDC IDC STAT BRADY RV PERCENT PACED: 0.06 %

## 2017-07-06 ENCOUNTER — Ambulatory Visit (INDEPENDENT_AMBULATORY_CARE_PROVIDER_SITE_OTHER): Payer: Medicare HMO

## 2017-07-06 DIAGNOSIS — Z95810 Presence of automatic (implantable) cardiac defibrillator: Secondary | ICD-10-CM

## 2017-07-06 DIAGNOSIS — I5022 Chronic systolic (congestive) heart failure: Secondary | ICD-10-CM

## 2017-07-06 NOTE — Progress Notes (Signed)
EPIC Encounter for ICM Monitoring  Patient Name: Paula Booker is a 72 y.o. female Date: 07/06/2017 Primary Care Physican: Patient, No Pcp Per Primary [place] Electrophysiologist: Lovena Le Dry Weight: Previous weight190lbs  Attempted call to patient and unable to reach.  Left detailed message regarding transmission.  Transmission reviewed.    Thoracic impedance normal but suggesting dryness starting 07/03/2016.  Impedance was abnormal suggesting fluid accumulation from 06/05/2017 through 06/25/2017 and 06/28/2017 through 07/02/2017.  Prescribed dosage: Furosemide 40 mg 1 to 2 tablets daily as directed for edema, SOB and/or weigh gain.  Recommendations: Left voice mail with ICM number and encouraged to call if experiencing any fluid symptoms.  Follow-up plan: ICM clinic phone appointment on 08/07/2017.   Copy of ICM check sent to Dr. Lovena Le.   3 month ICM trend: 07/06/2017    1 Year ICM trend:       [name], RN 07/06/2017 9:58 AM

## 2017-07-12 DIAGNOSIS — J302 Other seasonal allergic rhinitis: Secondary | ICD-10-CM | POA: Diagnosis not present

## 2017-07-19 DIAGNOSIS — G5603 Carpal tunnel syndrome, bilateral upper limbs: Secondary | ICD-10-CM | POA: Diagnosis not present

## 2017-07-19 DIAGNOSIS — G5601 Carpal tunnel syndrome, right upper limb: Secondary | ICD-10-CM | POA: Diagnosis not present

## 2017-07-19 DIAGNOSIS — G5602 Carpal tunnel syndrome, left upper limb: Secondary | ICD-10-CM | POA: Diagnosis not present

## 2017-07-25 DIAGNOSIS — G5601 Carpal tunnel syndrome, right upper limb: Secondary | ICD-10-CM | POA: Diagnosis not present

## 2017-07-26 DIAGNOSIS — G5601 Carpal tunnel syndrome, right upper limb: Secondary | ICD-10-CM | POA: Diagnosis not present

## 2017-07-27 ENCOUNTER — Encounter (HOSPITAL_COMMUNITY): Payer: Self-pay

## 2017-07-27 ENCOUNTER — Emergency Department (HOSPITAL_COMMUNITY): Payer: Medicare HMO

## 2017-07-27 ENCOUNTER — Emergency Department (HOSPITAL_COMMUNITY)
Admission: EM | Admit: 2017-07-27 | Discharge: 2017-07-28 | Disposition: A | Discharge status: Home / Self Care | Payer: Medicare HMO | Attending: Emergency Medicine | Admitting: Emergency Medicine

## 2017-07-27 ENCOUNTER — Other Ambulatory Visit: Payer: Self-pay

## 2017-07-27 DIAGNOSIS — R0602 Shortness of breath: Secondary | ICD-10-CM | POA: Insufficient documentation

## 2017-07-27 DIAGNOSIS — Z5321 Procedure and treatment not carried out due to patient leaving prior to being seen by health care provider: Secondary | ICD-10-CM | POA: Diagnosis not present

## 2017-07-27 LAB — BASIC METABOLIC PANEL
ANION GAP: 11 (ref 5–15)
BUN: 16 mg/dL (ref 6–20)
CO2: 23 mmol/L (ref 22–32)
Calcium: 9.1 mg/dL (ref 8.9–10.3)
Chloride: 103 mmol/L (ref 101–111)
Creatinine, Ser: 0.92 mg/dL (ref 0.44–1.00)
GFR calc non Af Amer: >60 mL/min (ref >60)
GLUCOSE: 180 mg/dL — AB (ref 65–99)
POTASSIUM: 3.5 mmol/L (ref 3.5–5.1)
Sodium: 137 mmol/L (ref 135–145)

## 2017-07-27 LAB — CBC WITH DIFFERENTIAL/PLATELET
BASOS PCT: 0 %
Basophils Absolute: 0 10*3/uL (ref 0.0–0.1)
Eosinophils Absolute: 0.2 10*3/uL (ref 0.0–0.7)
Eosinophils Relative: 2 %
HEMATOCRIT: 40.4 % (ref 36.0–46.0)
HEMOGLOBIN: 13.2 g/dL (ref 12.0–15.0)
LYMPHS PCT: 26 %
Lymphs Abs: 3.1 10*3/uL (ref 0.7–4.0)
MCH: 30.3 pg (ref 26.0–34.0)
MCHC: 32.7 g/dL (ref 30.0–36.0)
MCV: 92.9 fL (ref 78.0–100.0)
Monocytes Absolute: 0.6 10*3/uL (ref 0.1–1.0)
Monocytes Relative: 5 %
NEUTROS ABS: 8.1 10*3/uL — AB (ref 1.7–7.7)
NEUTROS PCT: 67 %
Platelets: 200 10*3/uL (ref 150–400)
RBC: 4.35 MIL/uL (ref 3.87–5.11)
RDW: 12.7 % (ref 11.5–15.5)
WBC: 12 10*3/uL — AB (ref 4.0–10.5)

## 2017-07-27 LAB — I-STAT TROPONIN, ED: Troponin i, poc: 0 ng/mL (ref 0.00–0.08)

## 2017-07-27 NOTE — ED Notes (Signed)
Pt stated she was leaving bc X-ray was done, and was ready to go.

## 2017-07-27 NOTE — ED Triage Notes (Addendum)
Pt presents with fall at home.  Pt unsure of why she fell, reports " 1 minute I was standing and the next, I was in the gravel".  Pt fell on R side and into side of her car.  Abrasions noted to R elbow and R knee.  Reports shortness of breath with R sided rib pain. Pt reports having shortness of breath before falling, reports h/o asthma.

## 2017-08-07 ENCOUNTER — Telehealth: Payer: Self-pay

## 2017-08-07 ENCOUNTER — Ambulatory Visit (INDEPENDENT_AMBULATORY_CARE_PROVIDER_SITE_OTHER): Payer: Medicare HMO

## 2017-08-07 DIAGNOSIS — Z95810 Presence of automatic (implantable) cardiac defibrillator: Secondary | ICD-10-CM

## 2017-08-07 DIAGNOSIS — S20211A Contusion of right front wall of thorax, initial encounter: Secondary | ICD-10-CM | POA: Diagnosis not present

## 2017-08-07 DIAGNOSIS — I5022 Chronic systolic (congestive) heart failure: Secondary | ICD-10-CM

## 2017-08-07 DIAGNOSIS — M25521 Pain in right elbow: Secondary | ICD-10-CM | POA: Diagnosis not present

## 2017-08-07 DIAGNOSIS — S5001XA Contusion of right elbow, initial encounter: Secondary | ICD-10-CM | POA: Diagnosis not present

## 2017-08-07 NOTE — Telephone Encounter (Signed)
Remote ICM transmission received.  Attempted call to patient and no mailbox set up

## 2017-08-07 NOTE — Progress Notes (Signed)
EPIC Encounter for ICM Monitoring  Patient Name: Paula Booker is a 72 y.o. female Date: 08/07/2017 Primary Care Physican: Patient, No Pcp Per Primary [place] Electrophysiologist: Lovena Le Dry Weight: Previous weight190lbs  Attempted call to patient and unable to reach.   Transmission reviewed.    Thoracic impedance abnormal suggesting fluid accumulation since 08/03/2017..  Prescribed dosage: Furosemide 40 mg 1 to 2 tablets daily as directed for edema, SOB and/or weigh gain.  Labs: 07/27/2017 Creatinine 0.92, BUN 16, Potassium 3.5, Sodium 137, EGFR >60  Recommendations:  NONE - Unable to reach.  Follow-up plan: ICM clinic phone appointment on 08/17/2017 to recheck fluid levels.    Copy of ICM check sent to Dr. Lovena Le and Dr. Irish Lack.   3 month ICM trend: 08/07/2017    1 Year ICM trend:       [name], RN 08/07/2017 10:55 AM

## 2017-08-17 ENCOUNTER — Ambulatory Visit (INDEPENDENT_AMBULATORY_CARE_PROVIDER_SITE_OTHER): Payer: Self-pay

## 2017-08-17 DIAGNOSIS — Z95810 Presence of automatic (implantable) cardiac defibrillator: Secondary | ICD-10-CM

## 2017-08-17 DIAGNOSIS — I5022 Chronic systolic (congestive) heart failure: Secondary | ICD-10-CM

## 2017-08-17 NOTE — Progress Notes (Signed)
EPIC Encounter for ICM Monitoring  Patient Name: Paula Booker is a 72 y.o. female Date: 08/17/2017 Primary Care Physican: Patient, No Pcp Per Primary [place] Electrophysiologist: Lovena Le Dry Weight:Previous weight190lbs      Attempted call to patient and unable to reach.   Transmission reviewed.    Thoracic impedance returned to normal since 4/25 transmission but returned to abnormal suggesting fluid accumulation since 08/10/2017.  Prescribed dosage: Furosemide 40 mg 1 to 2 tablets daily as directed for edema, SOB and/or weigh gain.  Labs: 07/27/2017 Creatinine 0.92, BUN 16, Potassium 3.5, Sodium 137, EGFR >60  Recommendations: NONE - Unable to reach.  Follow-up plan: ICM clinic phone appointment on 08/28/2017 to recheck fluid levels.    Copy of ICM check sent to Dr. Irish Lack and Dr. Lovena Le.   3 month ICM trend: 08/17/2017    1 Year ICM trend:       [name], RN 08/17/2017 10:23 AM

## 2017-08-18 ENCOUNTER — Telehealth: Payer: Self-pay

## 2017-08-18 NOTE — Telephone Encounter (Signed)
Remote ICM transmission received.  Attempted call to patient and voice mail box is not set up.

## 2017-08-21 DIAGNOSIS — M79601 Pain in right arm: Secondary | ICD-10-CM | POA: Diagnosis not present

## 2017-08-28 ENCOUNTER — Ambulatory Visit (INDEPENDENT_AMBULATORY_CARE_PROVIDER_SITE_OTHER): Payer: Self-pay

## 2017-08-28 DIAGNOSIS — Z95810 Presence of automatic (implantable) cardiac defibrillator: Secondary | ICD-10-CM

## 2017-08-28 DIAGNOSIS — I5022 Chronic systolic (congestive) heart failure: Secondary | ICD-10-CM

## 2017-08-28 NOTE — Progress Notes (Signed)
EPIC Encounter for ICM Monitoring  Patient Name: Paula Booker is a 72 y.o. female Date: 08/28/2017 Primary Care Physican: Patient, No Pcp Per Primary [place] Electrophysiologist: Lovena Le Dry Weight:Previous weight190lbs       Attempted call to patient and unable to reach.  Left detailed message, per DPR, regarding transmission.  Transmission reviewed.    Thoracic impedance returned to normal since last remote transmission but starting to suggest fluid accumulating today.   Prescribed dosage: Furosemide 40 mg 1 to 2 tablets daily as directed for edema, SOB and/or weigh gain.  Labs: 07/27/2017 Creatinine 0.92, BUN 16, Potassium 3.5, Sodium 137, EGFR >60  Recommendations: Left voice mail with ICM number and encouraged to call if experiencing any fluid symptoms.  Follow-up plan: ICM clinic phone appointment on 09/14/2017.    Copy of ICM check sent to Dr. Lovena Le and Dr. Christ Kick.   3 month ICM trend: 08/28/2017    1 Year ICM trend:       [name], RN 08/28/2017 10:46 AM

## 2017-08-29 ENCOUNTER — Telehealth: Payer: Self-pay

## 2017-08-29 NOTE — Telephone Encounter (Signed)
Remote ICM transmission received.  Attempted call to patient and left detailed message, per DPR, regarding transmission and next ICM scheduled for 09/14/2017.  Advised to return call for any fluid symptoms or questions.

## 2017-09-07 DIAGNOSIS — R195 Other fecal abnormalities: Secondary | ICD-10-CM | POA: Diagnosis not present

## 2017-09-07 DIAGNOSIS — R1013 Epigastric pain: Secondary | ICD-10-CM | POA: Diagnosis not present

## 2017-09-07 DIAGNOSIS — K219 Gastro-esophageal reflux disease without esophagitis: Secondary | ICD-10-CM | POA: Diagnosis not present

## 2017-09-11 ENCOUNTER — Encounter: Payer: Self-pay | Admitting: Pulmonary Disease

## 2017-09-11 ENCOUNTER — Ambulatory Visit (INDEPENDENT_AMBULATORY_CARE_PROVIDER_SITE_OTHER): Payer: Medicare HMO | Admitting: Pulmonary Disease

## 2017-09-11 VITALS — BP 128/72 | HR 78 | Ht 63.5 in | Wt 201.0 lb

## 2017-09-11 DIAGNOSIS — J4530 Mild persistent asthma, uncomplicated: Secondary | ICD-10-CM

## 2017-09-11 NOTE — Progress Notes (Signed)
Paula Booker    [id number]    [birth year]  Primary Care Physician:Patient, No Pcp Per  Referring Physician: [name], [place], [place] [address]  Chief complaint:  Follow up for  Mild persistent asthma  HPI: 72 Y/O with history of nonischemic cardiomyopathy, syncope, WPW syndrome status post ICD implant, asthma.  She is complains of dyspnea since 2001 but reports worsening symptoms over the past 3 months. She has been evaluated in the pulmonary office in 2014 with normal PFTs. She was given [facility] inhaler for her asthma but does not appear to have improved her symptoms.  Her chief complaint is dyspnea with activity and at rest, cough with no sputum production, occasional wheezing, denies any fevers, chills, hemoptysis. As noted by Dr. Sheryn Bison, PCP she has been active at home remodeling her kitchen cabinets without any issue. She recently got a Fitbit fitness tracker and is currently doing 2500 steps per day. She has plans to increase it to 10,000 steps per day. She has also lost 6 pounds recently and plans on losing 6 more pounds. Current meds include albuterol inhaler which she uses rarely.  Pets: 3 cats. No dogs, birds, exotic animals Occupation: Retired. She used to work as a Engineer, manufacturing systems, Pharmacist, hospital, Optometrist Exposures: No mold issues, no exposure to asbestos. No hot tubs, Jacuzzi at home Smoking history: Never smoker  Interval history: Started on breo at last visit.  States that breathing is improved considerably on this inhaler She is has not needed to use her albuterol inhaler  Outpatient Encounter Medications as of 09/11/2017  Medication Sig  . albuterol (PROVENTIL HFA;VENTOLIN HFA) 108 (90 BASE) MCG/ACT inhaler Inhale 2 puffs into the lungs every 6 (six) hours as needed. For wheezing  . alendronate (FOSAMAX) 70 MG tablet Take 70 mg by mouth once a week. Take with a full glass of water on an empty stomach.  . carvedilol (COREG) 12.5 MG  tablet TAKE 1 TABLET BY MOUTH TWICE DAILY WITH A MEAL  . cetirizine (ZYRTEC) 10 MG tablet Take 10 mg by mouth daily.  . Cholecalciferol (VITAMIN D3) 1000 UNITS CAPS Take 1,000 Units by mouth daily.   . citalopram (CELEXA) 20 MG tablet Take 20 mg by mouth daily.   . fluticasone furoate-vilanterol (BREO ELLIPTA) 200-25 MCG/INH AEPB Inhale 1 puff into the lungs daily.  . furosemide (LASIX) 40 MG tablet TAKE 1 TO 2 TABLETS BY MOUTH DAILY AS DIRECTED FOR EDEMA, SHORTNESS OF BREATH AND/OR WEIGHT GAIN  . Gelatin 600 MG CAPS Take 1,300 mg by mouth 2 (two) times daily.  Marland Kitchen losartan (COZAAR) 25 MG tablet TAKE 1 TABLET BY MOUTH TWICE DAILY  . ondansetron (ZOFRAN ODT) 4 MG disintegrating tablet Take 1 tablet (4 mg total) by mouth every 8 (eight) hours as needed for nausea or vomiting.  . pantoprazole (PROTONIX) 40 MG tablet Take 40 mg by mouth daily.  . ranitidine (ZANTAC) 150 MG tablet Take 1 tablet (150 mg total) by mouth 2 (two) times daily.  Marland Kitchen spironolactone (ALDACTONE) 25 MG tablet TAKE 1 TABLET BY MOUTH ONCE DAILY   No facility-administered encounter medications on file as of 09/11/2017.     Allergies as of 09/11/2017 - Review Complete 09/11/2017  Allergen Reaction Noted  . Ivp dye [iodinated diagnostic agents] Anaphylaxis, Hives, Swelling, and Other (See Comments) 03/07/2012  . Penicillins Anaphylaxis 09/12/2011  . Sulfonamide derivatives Other (See Comments) 09/12/2011  . Tessalon [benzonatate] Rash and Hives 04/03/2015  .  Sulfa antibiotics Other (See Comments) 10/16/2014  . Hydrocodone-acetaminophen Other (See Comments) 01/01/2013  . Penicillin g Other (See Comments) 07/03/2012  . Rosuvastatin calcium Other (See Comments) 03/17/2011  . Onion Other (See Comments) 04/23/2012    Past Medical History:  Diagnosis Date  . Anxiety   . Arthritis    "hands and knees" (03/22/2012)  . Asthma   . CHF (congestive heart failure) ([place]) 6/13  . Depression   . GERD (gastroesophageal reflux disease)     . Head injury, acute, with loss of consciousness ([place])   . Hypertension   . ICD (implantable cardiac defibrillator) in place Dec 2013  . Migraines   . NICM (nonischemic cardiomyopathy) ([place])   . Obesity (BMI 30-39.9)    negative sleep study 2014  . Pacemaker   . Pneumonia    hx  . Shortness of breath    "@ any time I can get SOB" (03/22/2012)  . Skin cancer 1999  . WPW (Wolff-Parkinson-White syndrome)     Past Surgical History:  Procedure Laterality Date  . CARDIAC CATHETERIZATION  09/18/11   no significant CAD  . CARDIAC DEFIBRILLATOR PLACEMENT  03/22/2012   DDD  . CHOLECYSTECTOMY  ~ 2003  . IMPLANTABLE CARDIOVERTER DEFIBRILLATOR IMPLANT N/A 03/22/2012   Procedure: IMPLANTABLE CARDIOVERTER DEFIBRILLATOR IMPLANT;  Surgeon: Evans Lance, MD;  [facility] CATH LAB;  Service: Cardiovascular;  Laterality: N/A;  . KYPHOPLASTY N/A 11/27/2013   Procedure: KYPHOPLASTY;  Surgeon: Sinclair Ship, MD;  Location: [place];  Service: Orthopedics;  Laterality: N/A;  T9, T11 kyphoplasty  . KYPHOPLASTY N/A 06/05/2014   Procedure: KYPHOPLASTY;  Surgeon: Sinclair Ship, MD;  Location: [place];  Service: Orthopedics;  Laterality: N/A;  T7 kyphoplasty  . MOHS SURGERY  06/2011   "forehead" (03/22/2012)  . SKIN CANCER EXCISION  1999   "top of my head and my right back shoulder"  . SKIN CANCER EXCISION     back  . SKIN CANCER EXCISION     face  . SUPRAVENTRICULAR TACHYCARDIA ABLATION  03/22/2012   unable to induce VT/RN report 03/22/2012  . SUPRAVENTRICULAR TACHYCARDIA ABLATION N/A 03/22/2012   Procedure: SUPRAVENTRICULAR TACHYCARDIA ABLATION;  Surgeon: Evans Lance, MD;  Location: [facility] CATH LAB;  Service: Cardiovascular;  Laterality: N/A;  . TONSILLECTOMY AND ADENOIDECTOMY  1950  . TUBAL LIGATION  1978    Family History  Problem Relation Age of Onset  . Congestive Heart Failure Brother   . Atrial fibrillation Brother   . Congestive Heart Failure Mother        died 62  . Kidney  Stones Mother   . Deafness Mother   . Atrial fibrillation Mother   . Congestive Heart Failure Father   . Deafness Father   . Healthy Sister   . Healthy Sister   . Healthy Sister   . Tuberculosis Paternal Grandfather     Social History   Socioeconomic History  . Marital status: Divorced    Spouse name: Not on file  . Number of children: 3  . Years of education: Not on file  . Highest education level: Not on file  Occupational History  . Occupation: retired    Comment: Mining engineer  . Financial resource strain: Not on file  . Food insecurity:    Worry: Not on file    Inability: Not on file  . Transportation needs:    Medical: Not on file    Non-medical: Not on file  Tobacco Use  .  Smoking status: Never Smoker  . Smokeless tobacco: Never Used  Substance and Sexual Activity  . Alcohol use: No  . Drug use: No  . Sexual activity: Not on file  Lifestyle  . Physical activity:    Days per week: Not on file    Minutes per session: Not on file  . Stress: Not on file  Relationships  . Social connections:    Talks on phone: Not on file    Gets together: Not on file    Attends religious service: Not on file    Active member of club or organization: Not on file    Attends meetings of clubs or organizations: Not on file    Relationship status: Not on file  . Intimate partner violence:    Fear of current or ex partner: Not on file    Emotionally abused: Not on file    Physically abused: Not on file    Forced sexual activity: Not on file  Other Topics Concern  . Not on file  Social History Narrative   Divorced, 3 children, 9 grandchildren    Review of systems: Review of Systems  Constitutional: Negative for fever and chills.  HENT: Negative.   Eyes: Negative for blurred vision.  Respiratory: as per HPI  Cardiovascular: Negative for chest pain and palpitations.  Gastrointestinal: Negative for vomiting, diarrhea, blood per rectum. Genitourinary: Negative  for dysuria, urgency, frequency and hematuria.  Musculoskeletal: Negative for myalgias, back pain and joint pain.  Skin: Negative for itching and rash.  Neurological: Negative for dizziness, tremors, focal weakness, seizures and loss of consciousness.  Endo/Heme/Allergies: Negative for environmental allergies.  Psychiatric/Behavioral: Negative for depression, suicidal ideas and hallucinations.  All other systems reviewed and are negative.  Physical Exam: Blood pressure 128/72, pulse 78, height 5' 3.5" (1.613 m), weight 201 lb (91.2 kg), SpO2 96 %. Gen:      No acute distress HEENT:  EOMI, sclera anicteric Neck:     No masses; no thyromegaly Lungs:    Clear to auscultation bilaterally; normal respiratory effort CV:         Regular rate and rhythm; no murmurs Abd:      + bowel sounds; soft, non-tender; no palpable masses, no distension Ext:    No edema; adequate peripheral perfusion Skin:      Warm and dry; no rash Neuro: alert and oriented x 3 Psych: normal mood and affect  Data Reviewed: PFTs FENO 09/20/16- 19  Spirometry 05/10/12 FVC 2.15 [75%), FEV1 1.64 [72%), F/F 76  PFTs 10/17/12 FVC 2.24 [80%), FEV1 1.75 [70%), F/F 78, TLC 88%, DLCO 93% Normal study  PFTs 01/26/17 FVC 2.21 [77%], FEV1 1.65 [77%], F/F 75, TLC 86%, DLCO 86% Small airways disease with significant bronchodilator response  Peak inspiratory flow 09/11/2017-60  Imaging CT chest 05/22/14-mild bilateral groundglass opacities consistent with edema CT abdomen, pelvis 03/18/15-left basilar consolidation Chest x-ray 07/27/2017- no acute cardiopulmonary normality. I reviewed all images personally.  Labs Blood allergy profile 01/26/2017-IgE 13, RAST panel shows sensitivity to dust mite CBC 01/26/2017-WBC 8.5, eos 4.7%, absolute eosinophil count 400  Assessment:  Mild persistent asthma Even though FENO is low she has elevated peripheral eos and the PFTs show significant reduction in mid flow rates with bronchodilator  response.  Symptoms are improved with Breo.  We will continue the same Continue albuterol as needed.  Some of her symptoms are due to obesity and deconditioning.  Encouraged weight loss and exercise.  GERD On Prilosec.  Scheduled to get  EGD and colonoscopy at [facility] in the next month.  Health maintenance 01/13/2017-influenza 03/19/1959-Pneumovax  Plan/Recommendations:  - Continue breo, albuterol PRN. - Continue weight loss and exercise.  Marshell Garfinkel MD [facility] 09/11/2017, 11:45 AM  CC: [name], MD

## 2017-09-11 NOTE — Progress Notes (Signed)
Inhaler resistance range- 65

## 2017-09-11 NOTE — Patient Instructions (Signed)
I am glad that you are doing better with the [facility] Continue with your current inhalers Follow-up in 6 months.

## 2017-09-14 ENCOUNTER — Ambulatory Visit (INDEPENDENT_AMBULATORY_CARE_PROVIDER_SITE_OTHER): Payer: Medicare HMO | Admitting: *Deleted

## 2017-09-14 DIAGNOSIS — I5022 Chronic systolic (congestive) heart failure: Secondary | ICD-10-CM

## 2017-09-14 DIAGNOSIS — I428 Other cardiomyopathies: Secondary | ICD-10-CM | POA: Diagnosis not present

## 2017-09-14 DIAGNOSIS — Z95810 Presence of automatic (implantable) cardiac defibrillator: Secondary | ICD-10-CM | POA: Diagnosis not present

## 2017-09-14 NOTE — Progress Notes (Signed)
Remote ICD transmission.   

## 2017-09-15 NOTE — Progress Notes (Signed)
EPIC Encounter for ICM Monitoring  Patient Name: Paula Booker is a 72 y.o. female Date: 09/15/2017 Primary Care Physican: Patient, No Pcp Per Primary [place] Electrophysiologist: Lovena Le Dry Weight: 196lbs       Heart Failure questions reviewed, pt has weight gain and has been out of town, eating out more than usual.    Thoracic impedance abnormal suggesting fluid accumulation since 08/26/2017 but is improving and trending closer to baseline.  Prescribed dosage: Furosemide 40 mg 1 to 2 tablets daily as directed for edema, SOB and/or weigh gain.  Labs: 07/27/2017 Creatinine 0.92, BUN 16, Potassium 3.5, Sodium 137, EGFR >60  Recommendations:  She is working on getting rid of fluid.  Encouraged to call for fluid symptoms.  Follow-up plan: ICM clinic phone appointment on 10/19/2017.  Recall appt 02/23/2018 with Dr. Irish Lack and 05/04/2018 with Dr Rayann Heman.  Copy of ICM check sent to Dr. Lovena Le.   3 month ICM trend: 09/14/2017    1 Year ICM trend:       [name], RN 09/15/2017 10:56 AM

## 2017-09-23 ENCOUNTER — Emergency Department (HOSPITAL_COMMUNITY): Payer: Medicare HMO

## 2017-09-23 ENCOUNTER — Other Ambulatory Visit: Payer: Self-pay

## 2017-09-23 ENCOUNTER — Inpatient Hospital Stay (HOSPITAL_COMMUNITY)
Admission: EM | Admit: 2017-09-23 | Discharge: 2017-09-26 | DRG: 202 | Disposition: A | Discharge status: Home / Self Care | Payer: Medicare HMO | Attending: Internal Medicine | Admitting: Internal Medicine

## 2017-09-23 ENCOUNTER — Encounter (HOSPITAL_COMMUNITY): Payer: Self-pay | Admitting: Emergency Medicine

## 2017-09-23 DIAGNOSIS — R062 Wheezing: Secondary | ICD-10-CM

## 2017-09-23 DIAGNOSIS — I5022 Chronic systolic (congestive) heart failure: Secondary | ICD-10-CM | POA: Diagnosis present

## 2017-09-23 DIAGNOSIS — J4541 Moderate persistent asthma with (acute) exacerbation: Secondary | ICD-10-CM

## 2017-09-23 DIAGNOSIS — Z87820 Personal history of traumatic brain injury: Secondary | ICD-10-CM

## 2017-09-23 DIAGNOSIS — I428 Other cardiomyopathies: Secondary | ICD-10-CM | POA: Diagnosis not present

## 2017-09-23 DIAGNOSIS — I10 Essential (primary) hypertension: Secondary | ICD-10-CM | POA: Diagnosis present

## 2017-09-23 DIAGNOSIS — I160 Hypertensive urgency: Secondary | ICD-10-CM | POA: Diagnosis present

## 2017-09-23 DIAGNOSIS — Z91041 Radiographic dye allergy status: Secondary | ICD-10-CM | POA: Diagnosis not present

## 2017-09-23 DIAGNOSIS — M19042 Primary osteoarthritis, left hand: Secondary | ICD-10-CM | POA: Diagnosis present

## 2017-09-23 DIAGNOSIS — Z885 Allergy status to narcotic agent status: Secondary | ICD-10-CM

## 2017-09-23 DIAGNOSIS — Z7951 Long term (current) use of inhaled steroids: Secondary | ICD-10-CM | POA: Diagnosis not present

## 2017-09-23 DIAGNOSIS — R0603 Acute respiratory distress: Secondary | ICD-10-CM | POA: Diagnosis not present

## 2017-09-23 DIAGNOSIS — Z91018 Allergy to other foods: Secondary | ICD-10-CM

## 2017-09-23 DIAGNOSIS — G43909 Migraine, unspecified, not intractable, without status migrainosus: Secondary | ICD-10-CM

## 2017-09-23 DIAGNOSIS — M19041 Primary osteoarthritis, right hand: Secondary | ICD-10-CM | POA: Diagnosis present

## 2017-09-23 DIAGNOSIS — Z888 Allergy status to other drugs, medicaments and biological substances status: Secondary | ICD-10-CM

## 2017-09-23 DIAGNOSIS — Z950 Presence of cardiac pacemaker: Secondary | ICD-10-CM | POA: Diagnosis not present

## 2017-09-23 DIAGNOSIS — Z882 Allergy status to sulfonamides status: Secondary | ICD-10-CM

## 2017-09-23 DIAGNOSIS — J45901 Unspecified asthma with (acute) exacerbation: Principal | ICD-10-CM | POA: Diagnosis present

## 2017-09-23 DIAGNOSIS — I447 Left bundle-branch block, unspecified: Secondary | ICD-10-CM | POA: Diagnosis present

## 2017-09-23 DIAGNOSIS — Z7983 Long term (current) use of bisphosphonates: Secondary | ICD-10-CM

## 2017-09-23 DIAGNOSIS — E669 Obesity, unspecified: Secondary | ICD-10-CM | POA: Diagnosis not present

## 2017-09-23 DIAGNOSIS — I456 Pre-excitation syndrome: Secondary | ICD-10-CM | POA: Diagnosis present

## 2017-09-23 DIAGNOSIS — F329 Major depressive disorder, single episode, unspecified: Secondary | ICD-10-CM | POA: Diagnosis present

## 2017-09-23 DIAGNOSIS — G43109 Migraine with aura, not intractable, without status migrainosus: Secondary | ICD-10-CM

## 2017-09-23 DIAGNOSIS — Z886 Allergy status to analgesic agent status: Secondary | ICD-10-CM

## 2017-09-23 DIAGNOSIS — Z880 Allergy status to penicillin: Secondary | ICD-10-CM | POA: Diagnosis not present

## 2017-09-23 DIAGNOSIS — Z6834 Body mass index (BMI) 34.0-34.9, adult: Secondary | ICD-10-CM | POA: Diagnosis not present

## 2017-09-23 DIAGNOSIS — I429 Cardiomyopathy, unspecified: Secondary | ICD-10-CM | POA: Diagnosis not present

## 2017-09-23 DIAGNOSIS — Z95810 Presence of automatic (implantable) cardiac defibrillator: Secondary | ICD-10-CM | POA: Diagnosis not present

## 2017-09-23 DIAGNOSIS — Z79899 Other long term (current) drug therapy: Secondary | ICD-10-CM

## 2017-09-23 DIAGNOSIS — K219 Gastro-esophageal reflux disease without esophagitis: Secondary | ICD-10-CM | POA: Diagnosis present

## 2017-09-23 DIAGNOSIS — F419 Anxiety disorder, unspecified: Secondary | ICD-10-CM

## 2017-09-23 DIAGNOSIS — T380X5A Adverse effect of glucocorticoids and synthetic analogues, initial encounter: Secondary | ICD-10-CM | POA: Diagnosis present

## 2017-09-23 DIAGNOSIS — I110 Hypertensive heart disease with heart failure: Secondary | ICD-10-CM | POA: Diagnosis present

## 2017-09-23 DIAGNOSIS — R0602 Shortness of breath: Secondary | ICD-10-CM | POA: Diagnosis not present

## 2017-09-23 DIAGNOSIS — R05 Cough: Secondary | ICD-10-CM | POA: Diagnosis not present

## 2017-09-23 DIAGNOSIS — J4530 Mild persistent asthma, uncomplicated: Secondary | ICD-10-CM | POA: Diagnosis present

## 2017-09-23 DIAGNOSIS — M170 Bilateral primary osteoarthritis of knee: Secondary | ICD-10-CM | POA: Diagnosis present

## 2017-09-23 DIAGNOSIS — F32A Depression, unspecified: Secondary | ICD-10-CM

## 2017-09-23 LAB — BASIC METABOLIC PANEL
Anion gap: 6 (ref 5–15)
BUN: 18 mg/dL (ref 6–20)
CO2: 26 mmol/L (ref 22–32)
CREATININE: 0.85 mg/dL (ref 0.44–1.00)
Calcium: 9.3 mg/dL (ref 8.9–10.3)
Chloride: 104 mmol/L (ref 101–111)
Glucose, Bld: 133 mg/dL — ABNORMAL HIGH (ref 65–99)
Potassium: 4 mmol/L (ref 3.5–5.1)
SODIUM: 136 mmol/L (ref 135–145)

## 2017-09-23 LAB — CBC
HCT: 41.1 % (ref 36.0–46.0)
Hemoglobin: 13.6 g/dL (ref 12.0–15.0)
MCH: 30.4 pg (ref 26.0–34.0)
MCHC: 33.1 g/dL (ref 30.0–36.0)
MCV: 91.7 fL (ref 78.0–100.0)
PLATELETS: 261 10*3/uL (ref 150–400)
RBC: 4.48 MIL/uL (ref 3.87–5.11)
RDW: 12.3 % (ref 11.5–15.5)
WBC: 7.9 10*3/uL (ref 4.0–10.5)

## 2017-09-23 LAB — CBG MONITORING, ED: GLUCOSE-CAPILLARY: 310 mg/dL — AB (ref 65–99)

## 2017-09-23 LAB — I-STAT TROPONIN, ED: Troponin i, poc: 0 ng/mL (ref 0.00–0.08)

## 2017-09-23 LAB — MAGNESIUM: MAGNESIUM: 1.9 mg/dL (ref 1.7–2.4)

## 2017-09-23 MED ORDER — PANTOPRAZOLE SODIUM 40 MG PO TBEC
40.0000 mg | DELAYED_RELEASE_TABLET | Freq: Every day | ORAL | Status: DC
  Administered 2017-09-24 – 2017-09-26 (×3): 40 mg via ORAL
  Filled 2017-09-23 (×3): qty 1

## 2017-09-23 MED ORDER — HYDRALAZINE HCL 20 MG/ML IJ SOLN: 5.0000 mg | Freq: Three times a day (TID) | INTRAMUSCULAR | Status: DC | PRN

## 2017-09-23 MED ORDER — BISACODYL 10 MG RE SUPP: 10.0000 mg | Freq: Every day | RECTAL | Status: DC | PRN

## 2017-09-23 MED ORDER — IPRATROPIUM BROMIDE 0.02 % IN SOLN
0.5000 mg | Freq: Once | RESPIRATORY_TRACT | Status: AC
  Administered 2017-09-23: 0.5 mg via RESPIRATORY_TRACT
  Filled 2017-09-23: qty 2.5

## 2017-09-23 MED ORDER — METHYLPREDNISOLONE SODIUM SUCC 125 MG IJ SOLR
125.0000 mg | Freq: Once | INTRAMUSCULAR | Status: AC
  Administered 2017-09-23: 125 mg via INTRAVENOUS
  Filled 2017-09-23: qty 2

## 2017-09-23 MED ORDER — FLUTICASONE FUROATE-VILANTEROL 200-25 MCG/INH IN AEPB
1.0000 | INHALATION_SPRAY | Freq: Every day | RESPIRATORY_TRACT | Status: DC
  Filled 2017-09-23: qty 28

## 2017-09-23 MED ORDER — LOSARTAN POTASSIUM 25 MG PO TABS
25.0000 mg | ORAL_TABLET | Freq: Two times a day (BID) | ORAL | Status: DC
  Administered 2017-09-23 – 2017-09-26 (×6): 25 mg via ORAL
  Filled 2017-09-23 (×6): qty 1

## 2017-09-23 MED ORDER — SPIRONOLACTONE 25 MG PO TABS
25.0000 mg | ORAL_TABLET | Freq: Every day | ORAL | Status: DC
  Administered 2017-09-24 – 2017-09-26 (×3): 25 mg via ORAL
  Filled 2017-09-23 (×3): qty 1

## 2017-09-23 MED ORDER — LORATADINE 10 MG PO TABS
10.0000 mg | ORAL_TABLET | Freq: Every day | ORAL | Status: DC
  Administered 2017-09-24 – 2017-09-26 (×3): 10 mg via ORAL
  Filled 2017-09-23 (×3): qty 1

## 2017-09-23 MED ORDER — CITALOPRAM HYDROBROMIDE 20 MG PO TABS
20.0000 mg | ORAL_TABLET | Freq: Every day | ORAL | Status: DC
  Administered 2017-09-24 – 2017-09-26 (×3): 20 mg via ORAL
  Filled 2017-09-23 (×3): qty 1

## 2017-09-23 MED ORDER — FUROSEMIDE 40 MG PO TABS
40.0000 mg | ORAL_TABLET | Freq: Every day | ORAL | Status: DC
  Administered 2017-09-24 – 2017-09-26 (×3): 40 mg via ORAL
  Filled 2017-09-23 (×3): qty 1

## 2017-09-23 MED ORDER — IPRATROPIUM-ALBUTEROL 0.5-2.5 (3) MG/3ML IN SOLN: 3.0000 mL | Freq: Four times a day (QID) | RESPIRATORY_TRACT | Status: DC | PRN

## 2017-09-23 MED ORDER — FLUTICASONE PROPIONATE 50 MCG/ACT NA SUSP
2.0000 | Freq: Every day | NASAL | Status: DC
  Administered 2017-09-25 – 2017-09-26 (×2): 2 via NASAL
  Filled 2017-09-23: qty 16

## 2017-09-23 MED ORDER — METHYLPREDNISOLONE SODIUM SUCC 125 MG IJ SOLR
60.0000 mg | Freq: Two times a day (BID) | INTRAMUSCULAR | Status: DC
  Administered 2017-09-24: 60 mg via INTRAVENOUS
  Filled 2017-09-23: qty 2

## 2017-09-23 MED ORDER — LORAZEPAM 2 MG/ML IJ SOLN
0.5000 mg | Freq: Once | INTRAMUSCULAR | Status: AC
  Administered 2017-09-23: 0.5 mg via INTRAVENOUS
  Filled 2017-09-23: qty 1

## 2017-09-23 MED ORDER — ENOXAPARIN SODIUM 40 MG/0.4ML ~~LOC~~ SOLN
40.0000 mg | SUBCUTANEOUS | Status: DC
  Administered 2017-09-23 – 2017-09-25 (×3): 40 mg via SUBCUTANEOUS
  Filled 2017-09-23 (×3): qty 0.4

## 2017-09-23 MED ORDER — ALBUTEROL SULFATE (2.5 MG/3ML) 0.083% IN NEBU
5.0000 mg | INHALATION_SOLUTION | Freq: Once | RESPIRATORY_TRACT | Status: AC
  Administered 2017-09-23: 5 mg via RESPIRATORY_TRACT
  Filled 2017-09-23: qty 6

## 2017-09-23 MED ORDER — VITAMIN B-12 5000 MCG PO LOZG: 5000.0000 ug | LOZENGE | Freq: Every day | ORAL | Status: DC

## 2017-09-23 MED ORDER — ONDANSETRON HCL 4 MG PO TABS: 4.0000 mg | ORAL_TABLET | Freq: Four times a day (QID) | ORAL | Status: DC | PRN

## 2017-09-23 MED ORDER — HYDROCODONE-ACETAMINOPHEN 5-325 MG PO TABS: 1.0000 | ORAL_TABLET | ORAL | Status: DC | PRN

## 2017-09-23 MED ORDER — ACETAMINOPHEN 650 MG RE SUPP: 650.0000 mg | Freq: Four times a day (QID) | RECTAL | Status: DC | PRN

## 2017-09-23 MED ORDER — DIPHENHYDRAMINE HCL 50 MG/ML IJ SOLN
25.0000 mg | Freq: Once | INTRAMUSCULAR | Status: AC
  Administered 2017-09-23: 25 mg via INTRAVENOUS
  Filled 2017-09-23: qty 1

## 2017-09-23 MED ORDER — VITAMIN B-12 1000 MCG PO TABS
5000.0000 ug | ORAL_TABLET | Freq: Every day | ORAL | Status: DC
  Administered 2017-09-24 – 2017-09-26 (×3): 5000 ug via ORAL
  Filled 2017-09-23 (×3): qty 5

## 2017-09-23 MED ORDER — ALBUTEROL (5 MG/ML) CONTINUOUS INHALATION SOLN
10.0000 mg/h | INHALATION_SOLUTION | RESPIRATORY_TRACT | Status: DC
  Administered 2017-09-23: 10 mg/h via RESPIRATORY_TRACT
  Filled 2017-09-23: qty 20

## 2017-09-23 MED ORDER — VITAMIN D 1000 UNITS PO TABS
1000.0000 [IU] | ORAL_TABLET | Freq: Every day | ORAL | Status: DC
  Administered 2017-09-24 – 2017-09-26 (×3): 1000 [IU] via ORAL
  Filled 2017-09-23 (×3): qty 1

## 2017-09-23 MED ORDER — ACETAMINOPHEN 325 MG PO TABS: 650.0000 mg | ORAL_TABLET | Freq: Four times a day (QID) | ORAL | Status: DC | PRN

## 2017-09-23 MED ORDER — VITAMIN D3 25 MCG (1000 UT) PO CAPS
1000.0000 [IU] | ORAL_CAPSULE | Freq: Every day | ORAL | Status: DC
Start: 2017-09-24 — End: 2017-09-23

## 2017-09-23 MED ORDER — CARVEDILOL 12.5 MG PO TABS
12.5000 mg | ORAL_TABLET | Freq: Two times a day (BID) | ORAL | Status: DC
  Administered 2017-09-23 – 2017-09-26 (×6): 12.5 mg via ORAL
  Filled 2017-09-23 (×6): qty 1

## 2017-09-23 MED ORDER — ONDANSETRON HCL 4 MG/2ML IJ SOLN: 4.0000 mg | Freq: Four times a day (QID) | INTRAMUSCULAR | Status: DC | PRN

## 2017-09-23 MED ORDER — ALBUTEROL SULFATE (2.5 MG/3ML) 0.083% IN NEBU: 5.0000 mg | INHALATION_SOLUTION | RESPIRATORY_TRACT | Status: DC | PRN

## 2017-09-23 NOTE — ED Notes (Signed)
Pt is in Xray. Xray will bring Pt to C27.

## 2017-09-23 NOTE — ED Provider Notes (Signed)
[facility] EMERGENCY DEPARTMENT Provider Note   CSN: [id number] Arrival date & time: 09/23/17  1007     History   Chief Complaint Chief Complaint  Patient presents with  . Asthma    HPI Paula Booker is a 72 y.o. female.  Patient c/o hx asthma, with increased wheezing, sob for the past 2-3 days. Symptoms persistent, mod-severe, worsening. Slightly improved w albuterol at home. +increased prod cough in past few days. No sore throat. No chest pain or discomfort. No leg pain or swelling. Non smoker. Hx environmental allergies.   The history is provided by the patient.  Asthma  Associated symptoms include shortness of breath. Pertinent negatives include no chest pain, no abdominal pain and no headaches.    Past Medical History:  Diagnosis Date  . Anxiety   . Arthritis    "hands and knees" (03/22/2012)  . Asthma   . CHF (congestive heart failure) ([place]) 6/13  . Depression   . GERD (gastroesophageal reflux disease)   . Head injury, acute, with loss of consciousness ([place])   . Hypertension   . ICD (implantable cardiac defibrillator) in place Dec 2013  . Migraines   . NICM (nonischemic cardiomyopathy) ([place])   . Obesity (BMI 30-39.9)    negative sleep study 2014  . Pacemaker   . Pneumonia    hx  . Shortness of breath    "@ any time I can get SOB" (03/22/2012)  . Skin cancer 1999  . WPW (Wolff-Parkinson-White syndrome)     Patient Active Problem List   Diagnosis Date Noted  . Chronic systolic heart failure ([place]) 04/28/2015  . UTI (urinary tract infection) 03/22/2015  . Sepsis ([place]) 03/19/2015  . Acute kidney injury ([place]) 03/19/2015  . Diarrhea 03/18/2015  . CAP (community acquired pneumonia) 03/18/2015  . Nausea vomiting and diarrhea 03/18/2015  . Compression fracture 06/05/2014  . Essential hypertension 03/27/2014  . Chest pain 01/27/2014  . Normal coronary arteries 01/27/2014  . Obesity (BMI 30-39.9)   . ICD- MDT implanted Dec 2013 03/26/2013    . NICM (nonischemic cardiomyopathy) ([place]) 02/21/2013  . GERD (gastroesophageal reflux disease) 04/23/2012  . Asthma exacerbation 04/23/2012  . Shortness of breath 04/23/2012  . Syncope 03/01/2012  . Acute on chronic systolic CHF (congestive heart failure) ([place]) 03/01/2012  . Type B WPW syndrome- RFA Dec 2013 03/01/2012    Past Surgical History:  Procedure Laterality Date  . CARDIAC CATHETERIZATION  09/18/11   no significant CAD  . CARDIAC DEFIBRILLATOR PLACEMENT  03/22/2012   DDD  . CHOLECYSTECTOMY  ~ 2003  . IMPLANTABLE CARDIOVERTER DEFIBRILLATOR IMPLANT N/A 03/22/2012   Procedure: IMPLANTABLE CARDIOVERTER DEFIBRILLATOR IMPLANT;  Surgeon: Evans Lance, MD;  Location: [facility] CATH LAB;  Service: Cardiovascular;  Laterality: N/A;  . KYPHOPLASTY N/A 11/27/2013   Procedure: KYPHOPLASTY;  Surgeon: Sinclair Ship, MD;  Location: [facility];  Service: Orthopedics;  Laterality: N/A;  T9, T11 kyphoplasty  . KYPHOPLASTY N/A 06/05/2014   Procedure: KYPHOPLASTY;  Surgeon: Sinclair Ship, MD;  Location: [facility];  Service: Orthopedics;  Laterality: N/A;  T7 kyphoplasty  . MOHS SURGERY  06/2011   "forehead" (03/22/2012)  . SKIN CANCER EXCISION  1999   "top of my head and my right back shoulder"  . SKIN CANCER EXCISION     back  . SKIN CANCER EXCISION     face  . SUPRAVENTRICULAR TACHYCARDIA ABLATION  03/22/2012   unable to induce VT/RN report 03/22/2012  . SUPRAVENTRICULAR TACHYCARDIA ABLATION N/A 03/22/2012  Procedure: SUPRAVENTRICULAR TACHYCARDIA ABLATION;  Surgeon: Evans Lance, MD;  Location: [facility] CATH LAB;  Service: Cardiovascular;  Laterality: N/A;  . TONSILLECTOMY AND ADENOIDECTOMY  1950  . TUBAL LIGATION  1978     OB History   None      Home Medications    Prior to Admission medications   Medication Sig Start Date End Date Taking? Authorizing Provider  albuterol (PROVENTIL HFA;VENTOLIN HFA) 108 (90 BASE) MCG/ACT inhaler Inhale 2 puffs into the lungs every 6 (six) hours as  needed. For wheezing    [provider]  alendronate (FOSAMAX) 70 MG tablet Take 70 mg by mouth once a week. Take with a full glass of water on an empty stomach.    [provider]  carvedilol (COREG) 12.5 MG tablet TAKE 1 TABLET BY MOUTH TWICE DAILY WITH A MEAL 06/21/17   Evans Lance, MD  cetirizine (ZYRTEC) 10 MG tablet Take 10 mg by mouth daily.    [provider]  Cholecalciferol (VITAMIN D3) 1000 UNITS CAPS Take 1,000 Units by mouth daily.     [provider]  citalopram (CELEXA) 20 MG tablet Take 20 mg by mouth daily.     [provider]  fluticasone furoate-vilanterol (BREO ELLIPTA) 200-25 MCG/INH AEPB Inhale 1 puff into the lungs daily. 02/03/17   Mannam, Hart Robinsons, MD  furosemide (LASIX) 40 MG tablet TAKE 1 TO 2 TABLETS BY MOUTH DAILY AS DIRECTED FOR EDEMA, SHORTNESS OF BREATH AND/OR WEIGHT GAIN 05/16/17   Jettie Booze, MD  Gelatin 600 MG CAPS Take 1,300 mg by mouth 2 (two) times daily.    [provider]  losartan (COZAAR) 25 MG tablet TAKE 1 TABLET BY MOUTH TWICE DAILY 03/22/17   Jettie Booze, MD  ondansetron (ZOFRAN ODT) 4 MG disintegrating tablet Take 1 tablet (4 mg total) by mouth every 8 (eight) hours as needed for nausea or vomiting. 04/05/16   Street, Mercedes, PA-C  pantoprazole (PROTONIX) 40 MG tablet Take 40 mg by mouth daily. 08/29/17   [provider]  ranitidine (ZANTAC) 150 MG tablet Take 1 tablet (150 mg total) by mouth 2 (two) times daily. 04/05/16   Street, [place], PA-C  spironolactone (ALDACTONE) 25 MG tablet TAKE 1 TABLET BY MOUTH ONCE DAILY 05/05/17   Jettie Booze, MD    Family History Family History  Problem Relation Age of Onset  . Congestive Heart Failure Brother   . Atrial fibrillation Brother   . Congestive Heart Failure Mother        died 67  . Kidney Stones Mother   . Deafness Mother   . Atrial fibrillation Mother   . Congestive Heart Failure Father   . Deafness Father    . Healthy Sister   . Healthy Sister   . Healthy Sister   . Tuberculosis Paternal Grandfather     Social History Social History   Tobacco Use  . Smoking status: Never Smoker  . Smokeless tobacco: Never Used  Substance Use Topics  . Alcohol use: No  . Drug use: No     Allergies   Ivp dye [iodinated diagnostic agents]; Penicillins; Sulfonamide derivatives; Tessalon [benzonatate]; Sulfa antibiotics; Hydrocodone-acetaminophen; Penicillin g; Rosuvastatin calcium; and Onion   Review of Systems Review of Systems  Constitutional: Negative for chills and fever.  HENT: Negative for sore throat.   Eyes: Negative for redness.  Respiratory: Positive for cough, shortness of breath and wheezing.   Cardiovascular: Negative for chest pain and leg swelling.  Gastrointestinal:  Negative for abdominal pain.  Genitourinary: Negative for flank pain.  Musculoskeletal: Negative for back pain and neck pain.  Skin: Negative for rash.  Neurological: Negative for headaches.  Hematological: Does not bruise/bleed easily.  Psychiatric/Behavioral: Negative for confusion.     Physical Exam Updated Vital Signs BP 104/89   Pulse 68   Temp 98.4 F (36.9 C) (Oral)   Resp 19   Ht 1.613 m (5' 3.5")   Wt 89.8 kg (198 lb)   SpO2 97%   BMI 34.52 kg/m   Physical Exam  Constitutional: She appears well-developed and well-nourished.  HENT:  Mouth/Throat: Oropharynx is clear and moist.  Eyes: Conjunctivae are normal. No scleral icterus.  Neck: Neck supple. No tracheal deviation present.  Cardiovascular: Normal rate, regular rhythm, normal heart sounds and intact distal pulses. Exam reveals no friction rub.  No murmur heard. Pulmonary/Chest: She is in respiratory distress. She has wheezes.  Abdominal: Soft. Normal appearance. She exhibits no distension. There is no tenderness.  Musculoskeletal: She exhibits no edema or tenderness.  Neurological: She is alert.  Skin: Skin is warm and dry. No rash  noted. She is not diaphoretic.  Psychiatric: She has a normal mood and affect.  Nursing note and vitals reviewed.    ED Treatments / Results  Labs (all labs ordered are listed, but only abnormal results are displayed) Results for orders placed or performed during the hospital encounter of 41/32/44  Basic metabolic panel  Result Value Ref Range   Sodium 136 135 - 145 mmol/L   Potassium 4.0 3.5 - 5.1 mmol/L   Chloride 104 101 - 111 mmol/L   CO2 26 22 - 32 mmol/L   Glucose, Bld 133 (H) 65 - 99 mg/dL   BUN 18 6 - 20 mg/dL   Creatinine, Ser 0.85 0.44 - 1.00 mg/dL   Calcium 9.3 8.9 - 10.3 mg/dL   GFR calc non Af Amer >60 >60 mL/min   GFR calc Af Amer >60 >60 mL/min   Anion gap 6 5 - 15  CBC  Result Value Ref Range   WBC 7.9 4.0 - 10.5 K/uL   RBC 4.48 3.87 - 5.11 MIL/uL   Hemoglobin 13.6 12.0 - 15.0 g/dL   HCT 41.1 36.0 - 46.0 %   MCV 91.7 78.0 - 100.0 fL   MCH 30.4 26.0 - 34.0 pg   MCHC 33.1 30.0 - 36.0 g/dL   RDW 12.3 11.5 - 15.5 %   Platelets 261 150 - 400 K/uL  I-stat troponin, ED  Result Value Ref Range   Troponin i, poc 0.00 0.00 - 0.08 ng/mL   Comment 3           Dg Chest 2 View  Result Date: 09/23/2017 CLINICAL DATA:  Shortness of breath and cough. EXAM: CHEST - 2 VIEW COMPARISON:  07/27/2017. FINDINGS: Trachea is midline. Pacemaker and ICD lead tips are in the right atrium and right ventricle, respectively. Heart size normal. Thoracic aorta is calcified. Lungs are clear. No pleural fluid. IMPRESSION: 1. No acute findings. 2.  Aortic atherosclerosis (ICD10-170.0). Electronically Signed   By: Lorin Picket M.D.   On: 09/23/2017 10:58    EKG EKG Interpretation  Date/Time:  Saturday September 23 2017 10:19:47 EDT Ventricular Rate:  88 PR Interval:  138 QRS Duration: 130 QT Interval:  424 QTC Calculation: 513 R Axis:   -4 Text Interpretation:  Normal sinus rhythm Left bundle branch block No significant change since last tracing Confirmed by Lajean Saver [phone]) on  09/23/2017 11:22:43 AM   Radiology Dg Chest 2 View  Result Date: 09/23/2017 CLINICAL DATA:  Shortness of breath and cough. EXAM: CHEST - 2 VIEW COMPARISON:  07/27/2017. FINDINGS: Trachea is midline. Pacemaker and ICD lead tips are in the right atrium and right ventricle, respectively. Heart size normal. Thoracic aorta is calcified. Lungs are clear. No pleural fluid. IMPRESSION: 1. No acute findings. 2.  Aortic atherosclerosis (ICD10-170.0). Electronically Signed   By: Lorin Picket M.D.   On: 09/23/2017 10:58    Procedures Procedures (including critical care time)  Medications Ordered in ED Medications  albuterol (PROVENTIL) (2.5 MG/3ML) 0.083% nebulizer solution 5 mg (5 mg Nebulization Given 09/23/17 1024)  albuterol (PROVENTIL) (2.5 MG/3ML) 0.083% nebulizer solution 5 mg (5 mg Nebulization Given 09/23/17 1133)  ipratropium (ATROVENT) nebulizer solution 0.5 mg (0.5 mg Nebulization Given 09/23/17 1133)  methylPREDNISolone sodium succinate (SOLU-MEDROL) 125 mg/2 mL injection 125 mg (125 mg Intravenous Given 09/23/17 1133)     Initial Impression / Assessment and Plan / ED Course  I have reviewed the triage vital signs and the nursing notes.  Pertinent labs & imaging results that were available during my care of the patient were reviewed by me and considered in my medical decision making (see chart for details).  Iv ns. Labs.   Albuterol neb.  Persistent wheezing.   Additional albuterol and atrovent nebs. Solumedrol iv.  Reviewed nursing notes and prior charts for additional history.   Reviewed xray - no pna.   Reviewed labs, chem normal.   Persistent wheezing. Continuous alb neb.  hospitalists consulted for admission.    Final Clinical Impressions(s) / ED Diagnoses   Final diagnoses:  None    ED Discharge Orders    None       Lajean Saver, MD 09/23/17 1437

## 2017-09-23 NOTE — ED Triage Notes (Signed)
Patient to ED c/o asthma attack - reports cough, wheezing, dyspnea x 2 days, no relief with home meds. Patient c/o generalized weakness and dizziness at this time, breathing labored, skin warm/dry. Denies fevers/chills or CP.

## 2017-09-23 NOTE — ED Notes (Signed)
RN on 2W took report from this RN, but room not clean at this time.  Will call this RN back when room ready for patient transport.

## 2017-09-23 NOTE — ED Notes (Signed)
Respiratory aware of need for hour long neb

## 2017-09-23 NOTE — ED Notes (Signed)
ED Provider at bedside. 

## 2017-09-23 NOTE — H&P (Addendum)
History and Physical    Lisel Siegrist KGM:[id number] DOB: [birth year] DOA: 09/23/2017   PCP: Patient, No Pcp Per   Patient coming from:  Home    Chief Complaint: Shortness of breath  HPI: Mouna Yager is a 72 y.o. female with medical history significant for cardiomyopathy, with EF 25%, on ICD, history of GERD, hypertension, arthritis, history of environmental allergies, asthma presenting with 2 to 3-day history of increased wheezing, shortness of breath, and nonproductive cough.  Symptoms were preceded by sore throat, but she is unaware of any sick contacts.  She also denies any changes in her environment but on questioning, she has been having remodeling her kitchen cabinets.  Symptoms are moderate to severe, and they seem to be worsening.  She also reported some head congestion.  She denies any fever, chills or night sweats.  At home, the patient tried albuterol, with slight improvement of her symptoms.  She has some chest tightness, but no frank chest pain, or palpitations.  She denies any lower extremity swelling, or calf pain.  She denies any recent long distance trips.  She denies any recent surgeries, prolonged mobility or hospitalizations.  She denies any tobacco, alcohol or recreational drug use.  Denies any history of PE or DVT.she was last seen by her pulmonologist, Dr. Vaughan Browner 09/11/2017, and she did mention that her symptoms were worsening actually over the last 3 months and as outpatient, PFTs show significant reduction in the mild flow rates, with bronchodilator response.  She is up-to-date with her influenza and Pneumovax vaccine   ED Course:  BP (!) 141/125   Pulse 80   Temp 98.4 F (36.9 C) (Oral)   Resp 19   Ht 5' 3.5" (1.613 m)   Wt 89.8 kg (198 lb)   SpO2 99%   BMI 34.52 kg/m   Sodium 136, potassium 4.0, chloride 104, bicarb 26, glucose 133.  BUN 18, creatinine 0.85.  Calcium 9.3, White count 7.9, hemoglobin 13.6, platelets 261 Troponin is 0 Chest x-ray shows no acute  findings. EKG normal sinus rhythm, left bundle branch block, no significant change since last tracing. Patient received 3 nebulizer treatments, the third 1 being more effective than the prior.  She also received Atrovent, Solu-Medrol 125 mg at 1130 this morning. The patient continues to have diffuse wheezing, despite the above nebulizers,    Review of Systems:  As per HPI otherwise all other systems reviewed and are negative  Past Medical History:  Diagnosis Date  . Anxiety   . Arthritis    "hands and knees" (03/22/2012)  . Asthma   . CHF (congestive heart failure) ([place]) 6/13  . Depression   . GERD (gastroesophageal reflux disease)   . Head injury, acute, with loss of consciousness ([place])   . Hypertension   . ICD (implantable cardiac defibrillator) in place Dec 2013  . Migraines   . NICM (nonischemic cardiomyopathy) ([place])   . Obesity (BMI 30-39.9)    negative sleep study 2014  . Pacemaker   . Pneumonia    hx  . Shortness of breath    "@ any time I can get SOB" (03/22/2012)  . Skin cancer 1999  . WPW (Wolff-Parkinson-White syndrome)     Past Surgical History:  Procedure Laterality Date  . CARDIAC CATHETERIZATION  09/18/11   no significant CAD  . CARDIAC DEFIBRILLATOR PLACEMENT  03/22/2012   DDD  . CHOLECYSTECTOMY  ~ 2003  . IMPLANTABLE CARDIOVERTER DEFIBRILLATOR IMPLANT N/A 03/22/2012   Procedure: IMPLANTABLE CARDIOVERTER DEFIBRILLATOR IMPLANT;  Surgeon: Evans Lance, MD;  Location: [facility] CATH LAB;  Service: Cardiovascular;  Laterality: N/A;  . KYPHOPLASTY N/A 11/27/2013   Procedure: KYPHOPLASTY;  Surgeon: Sinclair Ship, MD;  Location: [place];  Service: Orthopedics;  Laterality: N/A;  T9, T11 kyphoplasty  . KYPHOPLASTY N/A 06/05/2014   Procedure: KYPHOPLASTY;  Surgeon: Sinclair Ship, MD;  Location: [place];  Service: Orthopedics;  Laterality: N/A;  T7 kyphoplasty  . MOHS SURGERY  06/2011   "forehead" (03/22/2012)  . SKIN CANCER EXCISION  1999   "top of my  head and my right back shoulder"  . SKIN CANCER EXCISION     back  . SKIN CANCER EXCISION     face  . SUPRAVENTRICULAR TACHYCARDIA ABLATION  03/22/2012   unable to induce VT/RN report 03/22/2012  . SUPRAVENTRICULAR TACHYCARDIA ABLATION N/A 03/22/2012   Procedure: SUPRAVENTRICULAR TACHYCARDIA ABLATION;  Surgeon: Evans Lance, MD;  Location: [facility] CATH LAB;  Service: Cardiovascular;  Laterality: N/A;  . TONSILLECTOMY AND ADENOIDECTOMY  1950  . TUBAL LIGATION  1978    Social History Social History   Socioeconomic History  . Marital status: Divorced    Spouse name: Not on file  . Number of children: 3  . Years of education: Not on file  . Highest education level: Not on file  Occupational History  . Occupation: retired    Comment: Mining engineer  . Financial resource strain: Not on file  . Food insecurity:    Worry: Not on file    Inability: Not on file  . Transportation needs:    Medical: Not on file    Non-medical: Not on file  Tobacco Use  . Smoking status: Never Smoker  . Smokeless tobacco: Never Used  Substance and Sexual Activity  . Alcohol use: No  . Drug use: No  . Sexual activity: Not on file  Lifestyle  . Physical activity:    Days per week: Not on file    Minutes per session: Not on file  . Stress: Not on file  Relationships  . Social connections:    Talks on phone: Not on file    Gets together: Not on file    Attends religious service: Not on file    Active member of club or organization: Not on file    Attends meetings of clubs or organizations: Not on file    Relationship status: Not on file  . Intimate partner violence:    Fear of current or ex partner: Not on file    Emotionally abused: Not on file    Physically abused: Not on file    Forced sexual activity: Not on file  Other Topics Concern  . Not on file  Social History Narrative   Divorced, 3 children, 9 grandchildren     Allergies  Allergen Reactions  . Ivp Dye [Iodinated  Diagnostic Agents] Anaphylaxis, Hives, Swelling and Other (See Comments)    Throat swell  . Penicillins Anaphylaxis    Has patient had a PCN reaction causing immediate rash, facial/tongue/throat swelling, SOB or lightheadedness with hypotension: No Has patient had a PCN reaction causing severe rash involving mucus membranes or skin necrosis: No Has patient had a PCN reaction that required hospitalization No Has patient had a PCN reaction occurring within the last 10 years:No If all of the above answers are "NO", then may proceed with Cephalosporin use.  . Sulfonamide Derivatives Other (See Comments)    Migraines  . Tessalon [Benzonatate] Rash and Hives  Severe rash  . Sulfa Antibiotics Other (See Comments)    Migraine  . Hydrocodone-Acetaminophen Other (See Comments)  . Penicillin G Other (See Comments)  . Rosuvastatin Calcium Other (See Comments)  . Onion Other (See Comments)    Raw onions cause migraines    Family History  Problem Relation Age of Onset  . Congestive Heart Failure Brother   . Atrial fibrillation Brother   . Congestive Heart Failure Mother        died 63  . Kidney Stones Mother   . Deafness Mother   . Atrial fibrillation Mother   . Congestive Heart Failure Father   . Deafness Father   . Healthy Sister   . Healthy Sister   . Healthy Sister   . Tuberculosis Paternal Grandfather        Prior to Admission medications   Medication Sig Start Date End Date Taking? Authorizing Provider  albuterol (PROVENTIL HFA;VENTOLIN HFA) 108 (90 BASE) MCG/ACT inhaler Inhale 2 puffs into the lungs every 6 (six) hours as needed. For wheezing   Yes [provider]  alendronate (FOSAMAX) 70 MG tablet Take 70 mg by mouth once a week.    Yes [provider]  carvedilol (COREG) 12.5 MG tablet TAKE 1 TABLET BY MOUTH TWICE DAILY WITH A MEAL Patient taking differently: TAKE 1 TABLET ( 12.5 MG TOTALLY) MOUTH TWICE DAILY WITH A MEAL 06/21/17  Yes Evans Lance, MD    cetirizine (ZYRTEC) 10 MG tablet Take 10 mg by mouth daily.   Yes [provider]  Cholecalciferol (VITAMIN D3) 1000 UNITS CAPS Take 1,000 Units by mouth daily.    Yes [provider]  citalopram (CELEXA) 20 MG tablet Take 20 mg by mouth daily.    Yes [provider]  Cyanocobalamin (VITAMIN B-12) 5000 MCG LOZG Take 5,000 mcg by mouth daily.   Yes [provider]  fluticasone (FLONASE) 50 MCG/ACT nasal spray Place 2 sprays into both nostrils daily.   Yes [provider]  fluticasone furoate-vilanterol (BREO ELLIPTA) 200-25 MCG/INH AEPB Inhale 1 puff into the lungs daily. 02/03/17  Yes Mannam, Praveen, MD  furosemide (LASIX) 40 MG tablet TAKE 1 TO 2 TABLETS BY MOUTH DAILY AS DIRECTED FOR EDEMA, SHORTNESS OF BREATH AND/OR WEIGHT GAIN Patient taking differently: TAKE 2 TABLETS (80 MG TOTALLY) BY MOUTH EVERY MORNING 05/16/17  Yes Jettie Booze, MD  losartan (COZAAR) 25 MG tablet TAKE 1 TABLET BY MOUTH TWICE DAILY Patient taking differently: TAKE 1 TABLET (25 MG TOTALLY) BY MOUTH TWICE DAILY 03/22/17  Yes Jettie Booze, MD  pantoprazole (PROTONIX) 40 MG tablet Take 40 mg by mouth daily. 08/29/17  Yes [provider]  spironolactone (ALDACTONE) 25 MG tablet TAKE 1 TABLET BY MOUTH ONCE DAILY Patient taking differently: TAKE 1 TABLET (25 MG TOTALLY) BY MOUTH ONCE DAILY 05/05/17  Yes Jettie Booze, MD  ondansetron (ZOFRAN ODT) 4 MG disintegrating tablet Take 1 tablet (4 mg total) by mouth every 8 (eight) hours as needed for nausea or vomiting. Patient not taking: Reported on 09/23/2017 04/05/16   Street, Faxon, PA-C  ranitidine (ZANTAC) 150 MG tablet Take 1 tablet (150 mg total) by mouth 2 (two) times daily. Patient not taking: Reported on 09/23/2017 04/05/16   Street, [place], [place]     Physical Exam:  Vitals:   09/23/17 1300 09/23/17 1315 09/23/17 1330 09/23/17 1418  BP: (!) 119/49 (!) 129/93 109/83 (!) 141/125  Pulse: 60 81  78 80  Resp: (!) 30 17  18 19  Temp:      TempSrc:      SpO2: 92% 95% 95% 99%  Weight:      Height:       Constitutional: NAD, appears  Uncomfortable but tolerating nebulizers treatments well  Eyes: PERRL, lids and conjunctivae normal ENMT: Mucous membranes are moist, without exudate or lesions  Neck: normal, supple, no masses, no thyromegaly Respiratory:  Diffuse wheezing throughout  no crackles, minimal rhonchi on the L. Moderate respiratory effort  Cardiovascular: Regular rate and rhythm, very soft 1/6  murmur, no rubs or gallops. No extremity edema. 2+ pedal pulses. No carotid bruits.  Abdomen: Soft, obese non tender, No hepatosplenomegaly. Bowel sounds positive.  Musculoskeletal: no clubbing / cyanosis. Moves all extremities Skin: no jaundice, No lesions.  Neurologic: Sensation intact  Strength equal in all extremities Psychiatric:   Alert and oriented x 3. Anxious mood.     Labs on Admission: I have personally reviewed following labs and imaging studies  CBC: Recent Labs  Lab 09/23/17 1027  WBC 7.9  HGB 13.6  HCT 41.1  MCV 91.7  PLT 419    Basic Metabolic Panel: Recent Labs  Lab 09/23/17 1027  NA 136  K 4.0  CL 104  CO2 26  GLUCOSE 133*  BUN 18  CREATININE 0.85  CALCIUM 9.3    GFR: Estimated Creatinine Clearance: 65.3 mL/min (by C-G formula based on SCr of 0.85 mg/dL).  Liver Function Tests: No results for input(s): AST, ALT, ALKPHOS, BILITOT, PROT, ALBUMIN in the last 168 hours. No results for input(s): LIPASE, AMYLASE in the last 168 hours. No results for input(s): AMMONIA in the last 168 hours.  Coagulation Profile: No results for input(s): INR, PROTIME in the last 168 hours.  Cardiac Enzymes: No results for input(s): CKTOTAL, CKMB, CKMBINDEX, TROPONINI in the last 168 hours.  BNP (last 3 results) No results for input(s): PROBNP in the last 8760 hours.  HbA1C: No results for input(s): HGBA1C in the last 72 hours.  CBG: No results for  input(s): GLUCAP in the last 168 hours.  Lipid Profile: No results for input(s): CHOL, HDL, LDLCALC, TRIG, CHOLHDL, LDLDIRECT in the last 72 hours.  Thyroid Function Tests: No results for input(s): TSH, T4TOTAL, FREET4, T3FREE, THYROIDAB in the last 72 hours.  Anemia Panel: No results for input(s): VITAMINB12, FOLATE, FERRITIN, TIBC, IRON, RETICCTPCT in the last 72 hours.  Urine analysis:    Component Value Date/Time   COLORURINE YELLOW 03/19/2015 0049   APPEARANCEUR CLOUDY (A) 03/19/2015 0049   LABSPEC 1.018 03/19/2015 0049   PHURINE 6.0 03/19/2015 0049   GLUCOSEU NEGATIVE 03/19/2015 0049   HGBUR MODERATE (A) 03/19/2015 0049   BILIRUBINUR NEGATIVE 03/19/2015 0049   KETONESUR NEGATIVE 03/19/2015 0049   PROTEINUR 30 (A) 03/19/2015 0049   UROBILINOGEN 0.2 06/03/2014 1608   NITRITE NEGATIVE 03/19/2015 0049   LEUKOCYTESUR SMALL (A) 03/19/2015 0049    Sepsis Labs: @LABRCNTIP (procalcitonin:4,lacticidven:4) )No results found for this or any previous visit (from the past 240 hour(s)).   Radiological Exams on Admission: Dg Chest 2 View  Result Date: 09/23/2017 CLINICAL DATA:  Shortness of breath and cough. EXAM: CHEST - 2 VIEW COMPARISON:  07/27/2017. FINDINGS: Trachea is midline. Pacemaker and ICD lead tips are in the right atrium and right ventricle, respectively. Heart size normal. Thoracic aorta is calcified. Lungs are clear. No pleural fluid. IMPRESSION: 1. No acute findings. 2.  Aortic atherosclerosis (ICD10-170.0). Electronically Signed   By: Lorin Picket M.D.   On: 09/23/2017 10:58  EKG: Independently reviewed.  Assessment/Plan Principal Problem:   Shortness of breath Active Problems:   Asthma exacerbation   Type B WPW syndrome- RFA Dec 2013   GERD (gastroesophageal reflux disease)   NICM (nonischemic cardiomyopathy) ([place])   ICD- MDT implanted Dec 2013   Obesity (BMI 30-39.9)   Essential hypertension   Chronic systolic heart failure (HCC)   Migraines    Anxiety   Depression   Pacemaker     Acute respiratory distress due to Asthma exacerbation:   Patient somewhat improved after steroids and nebs,  and Soumedrol 125 mg IV x1 treatment in ED. CXR w/o evidence of acute process.O2 sats normal in RA, given 2 L O2 for comfort med surg, will observe overnight. WBC normal. Afebrile.  PFTs as outpt, follow with pulmonologist and his symptoms persist, or worsen Duonebs 6 hours as needed albuterol Q2PRN Solumedrol 60 mg bid x24 hrs , may need to discharge on prednisone taper if wheezing continues PPI as patient is on steroids  Continue home Breo  Mucinex prn for "head mucus congestion"   Chronic systolic heart failure/ NICM / H/o WPW 2013 / sp ICD 2013   The patient has a low EF, 25%, with severely reduced systolic and has an ICD in place.  EKG with sinus rhythm, left bundle branch block, ranges from prior.  Negative troponin the patient follows Dr. Irish Lack Continue Lasix 40 mg twice daily as needed, lisinopril abd Coreg  Monitor I/O  Weight daily  Follow with cardiologist outpatient Continue Coreg   GERD, no acute symptoms Continue PPI  Depression Continue home  Celexa    Hypertension with urgency, worsened in the setting of steroids   BP 141/125   Pulse 80  Continue home anti-hypertensive medications  Add Hydralazine Q8 hours as needed for BP 160/90   GERD, no acute symptoms Continue PPI   DVT prophylaxis:  Lovenox  Code Status:    FUll  Family Communication:  Discussed with patient Disposition Plan: Expect patient to be discharged to home after condition improves Consults called:    None  Admission status: Medsurg Obs    Sharene Butters, PA-C [facility]   Amion text  [id number]   09/23/2017, 3:49 PM

## 2017-09-24 DIAGNOSIS — I456 Pre-excitation syndrome: Secondary | ICD-10-CM

## 2017-09-24 DIAGNOSIS — R0603 Acute respiratory distress: Secondary | ICD-10-CM | POA: Diagnosis present

## 2017-09-24 DIAGNOSIS — Z95810 Presence of automatic (implantable) cardiac defibrillator: Secondary | ICD-10-CM | POA: Diagnosis not present

## 2017-09-24 DIAGNOSIS — Z950 Presence of cardiac pacemaker: Secondary | ICD-10-CM

## 2017-09-24 DIAGNOSIS — J45901 Unspecified asthma with (acute) exacerbation: Secondary | ICD-10-CM | POA: Diagnosis present

## 2017-09-24 DIAGNOSIS — I428 Other cardiomyopathies: Secondary | ICD-10-CM

## 2017-09-24 DIAGNOSIS — J4541 Moderate persistent asthma with (acute) exacerbation: Secondary | ICD-10-CM

## 2017-09-24 DIAGNOSIS — I5022 Chronic systolic (congestive) heart failure: Secondary | ICD-10-CM

## 2017-09-24 DIAGNOSIS — F419 Anxiety disorder, unspecified: Secondary | ICD-10-CM | POA: Diagnosis present

## 2017-09-24 DIAGNOSIS — I429 Cardiomyopathy, unspecified: Secondary | ICD-10-CM | POA: Diagnosis present

## 2017-09-24 DIAGNOSIS — K219 Gastro-esophageal reflux disease without esophagitis: Secondary | ICD-10-CM | POA: Diagnosis present

## 2017-09-24 DIAGNOSIS — Z87820 Personal history of traumatic brain injury: Secondary | ICD-10-CM | POA: Diagnosis not present

## 2017-09-24 DIAGNOSIS — M170 Bilateral primary osteoarthritis of knee: Secondary | ICD-10-CM | POA: Diagnosis present

## 2017-09-24 DIAGNOSIS — Z880 Allergy status to penicillin: Secondary | ICD-10-CM | POA: Diagnosis not present

## 2017-09-24 DIAGNOSIS — Z6834 Body mass index (BMI) 34.0-34.9, adult: Secondary | ICD-10-CM | POA: Diagnosis not present

## 2017-09-24 DIAGNOSIS — T380X5A Adverse effect of glucocorticoids and synthetic analogues, initial encounter: Secondary | ICD-10-CM | POA: Diagnosis present

## 2017-09-24 DIAGNOSIS — R0602 Shortness of breath: Secondary | ICD-10-CM

## 2017-09-24 DIAGNOSIS — Z79899 Other long term (current) drug therapy: Secondary | ICD-10-CM | POA: Diagnosis not present

## 2017-09-24 DIAGNOSIS — G43909 Migraine, unspecified, not intractable, without status migrainosus: Secondary | ICD-10-CM | POA: Diagnosis present

## 2017-09-24 DIAGNOSIS — F329 Major depressive disorder, single episode, unspecified: Secondary | ICD-10-CM

## 2017-09-24 DIAGNOSIS — I10 Essential (primary) hypertension: Secondary | ICD-10-CM

## 2017-09-24 DIAGNOSIS — Z7951 Long term (current) use of inhaled steroids: Secondary | ICD-10-CM | POA: Diagnosis not present

## 2017-09-24 DIAGNOSIS — I160 Hypertensive urgency: Secondary | ICD-10-CM | POA: Diagnosis present

## 2017-09-24 DIAGNOSIS — I447 Left bundle-branch block, unspecified: Secondary | ICD-10-CM | POA: Diagnosis present

## 2017-09-24 DIAGNOSIS — Z91041 Radiographic dye allergy status: Secondary | ICD-10-CM | POA: Diagnosis not present

## 2017-09-24 DIAGNOSIS — M19041 Primary osteoarthritis, right hand: Secondary | ICD-10-CM | POA: Diagnosis present

## 2017-09-24 DIAGNOSIS — E669 Obesity, unspecified: Secondary | ICD-10-CM | POA: Diagnosis present

## 2017-09-24 DIAGNOSIS — M19042 Primary osteoarthritis, left hand: Secondary | ICD-10-CM | POA: Diagnosis present

## 2017-09-24 DIAGNOSIS — I110 Hypertensive heart disease with heart failure: Secondary | ICD-10-CM | POA: Diagnosis present

## 2017-09-24 LAB — BASIC METABOLIC PANEL
ANION GAP: 10 (ref 5–15)
BUN: 27 mg/dL — AB (ref 6–20)
CO2: 23 mmol/L (ref 22–32)
Calcium: 9.1 mg/dL (ref 8.9–10.3)
Chloride: 99 mmol/L — ABNORMAL LOW (ref 101–111)
Creatinine, Ser: 1.06 mg/dL — ABNORMAL HIGH (ref 0.44–1.00)
GFR calc Af Amer: 60 mL/min — ABNORMAL LOW (ref >60)
GFR, EST NON AFRICAN AMERICAN: 52 mL/min — AB (ref >60)
Glucose, Bld: 189 mg/dL — ABNORMAL HIGH (ref 65–99)
POTASSIUM: 3.7 mmol/L (ref 3.5–5.1)
SODIUM: 132 mmol/L — AB (ref 135–145)

## 2017-09-24 LAB — CBC
HEMATOCRIT: 38.2 % (ref 36.0–46.0)
HEMOGLOBIN: 12.6 g/dL (ref 12.0–15.0)
MCH: 30.9 pg (ref 26.0–34.0)
MCHC: 33 g/dL (ref 30.0–36.0)
MCV: 93.6 fL (ref 78.0–100.0)
Platelets: 219 10*3/uL (ref 150–400)
RBC: 4.08 MIL/uL (ref 3.87–5.11)
RDW: 12.5 % (ref 11.5–15.5)
WBC: 7.8 10*3/uL (ref 4.0–10.5)

## 2017-09-24 MED ORDER — DM-GUAIFENESIN ER 30-600 MG PO TB12
2.0000 | ORAL_TABLET | Freq: Two times a day (BID) | ORAL | Status: DC
  Administered 2017-09-24 – 2017-09-26 (×5): 2 via ORAL
  Filled 2017-09-24 (×5): qty 2

## 2017-09-24 MED ORDER — SODIUM CHLORIDE 0.9 % IV SOLN
500.0000 mg | INTRAVENOUS | Status: DC
  Administered 2017-09-24 – 2017-09-25 (×2): 500 mg via INTRAVENOUS
  Filled 2017-09-24 (×3): qty 500

## 2017-09-24 MED ORDER — METHYLPREDNISOLONE SODIUM SUCC 125 MG IJ SOLR
60.0000 mg | Freq: Three times a day (TID) | INTRAMUSCULAR | Status: DC
  Administered 2017-09-24 – 2017-09-25 (×3): 60 mg via INTRAVENOUS
  Filled 2017-09-24 (×3): qty 2

## 2017-09-24 MED ORDER — BUDESONIDE 0.5 MG/2ML IN SUSP
0.5000 mg | Freq: Two times a day (BID) | RESPIRATORY_TRACT | Status: DC
  Administered 2017-09-24 – 2017-09-26 (×5): 0.5 mg via RESPIRATORY_TRACT
  Filled 2017-09-24 (×5): qty 2

## 2017-09-24 MED ORDER — IPRATROPIUM-ALBUTEROL 0.5-2.5 (3) MG/3ML IN SOLN
3.0000 mL | Freq: Four times a day (QID) | RESPIRATORY_TRACT | Status: DC
  Administered 2017-09-24 (×2): 3 mL via RESPIRATORY_TRACT
  Filled 2017-09-24 (×2): qty 3

## 2017-09-24 MED ORDER — ARFORMOTEROL TARTRATE 15 MCG/2ML IN NEBU
15.0000 ug | INHALATION_SOLUTION | Freq: Two times a day (BID) | RESPIRATORY_TRACT | Status: DC
  Administered 2017-09-24 – 2017-09-26 (×5): 15 ug via RESPIRATORY_TRACT
  Filled 2017-09-24 (×5): qty 2

## 2017-09-24 MED ORDER — IPRATROPIUM-ALBUTEROL 0.5-2.5 (3) MG/3ML IN SOLN
3.0000 mL | Freq: Three times a day (TID) | RESPIRATORY_TRACT | Status: DC
  Administered 2017-09-24 – 2017-09-26 (×5): 3 mL via RESPIRATORY_TRACT
  Filled 2017-09-24 (×5): qty 3

## 2017-09-24 MED ORDER — ZOLPIDEM TARTRATE 5 MG PO TABS
5.0000 mg | ORAL_TABLET | Freq: Every evening | ORAL | Status: DC | PRN
  Administered 2017-09-25: 5 mg via ORAL
  Filled 2017-09-24: qty 1

## 2017-09-24 MED ORDER — DM-GUAIFENESIN ER 30-600 MG PO TB12
1.0000 | ORAL_TABLET | Freq: Two times a day (BID) | ORAL | Status: DC
  Administered 2017-09-24: 1 via ORAL
  Filled 2017-09-24: qty 1

## 2017-09-24 NOTE — Progress Notes (Signed)
Paged On-call MD - X. Blount NP for clarification of orders:  ED MD order for cardiac monitoring until discontinued and Admitting MD order level of care Med-Surg.

## 2017-09-24 NOTE — Progress Notes (Signed)
PROGRESS NOTE    Paula Booker  YHC:[id number] DOB: [birth year] DOA: 09/23/2017 PCP: Patient, No Pcp Per   Brief Narrative:  Patient is a 72 year old female history of cardiomyopathy EF 25% status post ICD, history of GERD, hypertension, arthritis, allergies presented to ED with a 2 to 3-day history of increased wheezing, shortness of breath and nonproductive cough.  Patient admitted for an acute asthma exacerbation.   Assessment & Plan:   Principal Problem:   Shortness of breath Active Problems:   Type B WPW syndrome- RFA Dec 2013   GERD (gastroesophageal reflux disease)   Asthma exacerbation   NICM (nonischemic cardiomyopathy) ([place])   ICD- MDT implanted Dec 2013   Obesity (BMI 30-39.9)   Essential hypertension   Chronic systolic heart failure (HCC)   Migraines   Anxiety   Depression   Pacemaker  #1 shortness of breath secondary to acute asthma exacerbation Patient with poor to fair air movement on examination with inspiratory and expiratory wheezing.  Placed on IV Solu-Medrol 60 mg every 8 hours, Brovana, Pulmicort, Flonase.  Increase Mucinex to 2 tablets twice daily.  Placed on scheduled duo nebs every 6 hours.  Continue Claritin, PPI.  Place empirically on IV azithromycin..  2.  Gastroesophageal reflux disease PPI.  3.  Chronic systolic heart failure/nonischemic cardiomyopathy/history of WPW 2013/status post ICD 2013 Patient with a EF of 25% with severely reduced systolic function status post ICD in place.  EKG with normal sinus rhythm with left bundle branch block unchanged from prior EKG.  Troponin was negative.  Patient with borderline blood pressure and as such we will hold today's Lasix.  Continue lisinopril and Coreg, Cozaar and spironolactone.  Follow.  4.  Depression/anxiety Stable.  Continue home regimen of Celexa.  5.  Hypertension Continue lisinopril, Coreg, Cozaar, spironolactone.  Hold today's dose Lasix.  Follow.   DVT prophylaxis: Lovenox Code Status:  Full Family Communication: Updated patient.  No family at bedside. Disposition Plan: Home once clinically improved and close to baseline.   Consultants:   None  Procedures  Chest x-ray 09/23/2017  Antimicrobials:   IV azithromycin 09/24/2017   Subjective: Laying in bed.  States shortness of breath improved since admission however still significantly short of breath and not at baseline.  Denies any chest pain.  Complaining of cough.  Patient states unable to sleep well last night.  Objective: Vitals:   09/23/17 2011 09/24/17 0300 09/24/17 0321 09/24/17 0855  BP:   (!) 109/50 (!) 100/47  Pulse:   92 86  Resp:   16   Temp:   97.6 F (36.4 C)   TempSrc:   Oral   SpO2:   94%   Weight: 95.8 kg (211 lb 3.2 oz) 95.8 kg (211 lb 3.2 oz)    Height: 5' 3.5" (1.613 m)       Intake/Output Summary (Last 24 hours) at 09/24/2017 1126 Last data filed at 09/24/2017 0300 Gross per 24 hour  Intake 360 ml  Output -  Net 360 ml   Filed Weights   09/23/17 1012 09/23/17 2011 09/24/17 0300  Weight: 89.8 kg (198 lb) 95.8 kg (211 lb 3.2 oz) 95.8 kg (211 lb 3.2 oz)    Examination:  General exam: Appears calm and comfortable  Respiratory system: Inspiratory and expiratory wheezing.  Poor to fair air movement.  No crackles.  No rhonchi.  Speaking in full sentences.  Respiratory effort normal. Cardiovascular system: S1 & S2 heard, RRR. No JVD, murmurs, rubs, gallops or clicks. No pedal  edema. Gastrointestinal system: Abdomen is nondistended, soft and nontender. No organomegaly or masses felt. Normal bowel sounds heard. Central nervous system: Alert and oriented. No focal neurological deficits. Extremities: Symmetric 5 x 5 power. Skin: No rashes, lesions or ulcers Psychiatry: Judgement and insight appear normal. Mood & affect appropriate.     Data Reviewed: I have personally reviewed following labs and imaging studies  CBC: Recent Labs  Lab 09/23/17 1027 09/24/17 0302  WBC 7.9 7.8  HGB  13.6 12.6  HCT 41.1 38.2  MCV 91.7 93.6  PLT 261 062   Basic Metabolic Panel: Recent Labs  Lab 09/23/17 1027 09/24/17 0302  NA 136 132*  K 4.0 3.7  CL 104 99*  CO2 26 23  GLUCOSE 133* 189*  BUN 18 27*  CREATININE 0.85 1.06*  CALCIUM 9.3 9.1  MG 1.9  --    GFR: Estimated Creatinine Clearance: 54.2 mL/min (A) (by C-G formula based on SCr of 1.06 mg/dL (H)). Liver Function Tests: No results for input(s): AST, ALT, ALKPHOS, BILITOT, PROT, ALBUMIN in the last 168 hours. No results for input(s): LIPASE, AMYLASE in the last 168 hours. No results for input(s): AMMONIA in the last 168 hours. Coagulation Profile: No results for input(s): INR, PROTIME in the last 168 hours. Cardiac Enzymes: No results for input(s): CKTOTAL, CKMB, CKMBINDEX, TROPONINI in the last 168 hours. BNP (last 3 results) No results for input(s): PROBNP in the last 8760 hours. HbA1C: No results for input(s): HGBA1C in the last 72 hours. CBG: Recent Labs  Lab 09/23/17 1848  GLUCAP 310*   Lipid Profile: No results for input(s): CHOL, HDL, LDLCALC, TRIG, CHOLHDL, LDLDIRECT in the last 72 hours. Thyroid Function Tests: No results for input(s): TSH, T4TOTAL, FREET4, T3FREE, THYROIDAB in the last 72 hours. Anemia Panel: No results for input(s): VITAMINB12, FOLATE, FERRITIN, TIBC, IRON, RETICCTPCT in the last 72 hours. Sepsis Labs: No results for input(s): PROCALCITON, LATICACIDVEN in the last 168 hours.  No results found for this or any previous visit (from the past 240 hour(s)).       Radiology Studies: Dg Chest 2 View  Result Date: 09/23/2017 CLINICAL DATA:  Shortness of breath and cough. EXAM: CHEST - 2 VIEW COMPARISON:  07/27/2017. FINDINGS: Trachea is midline. Pacemaker and ICD lead tips are in the right atrium and right ventricle, respectively. Heart size normal. Thoracic aorta is calcified. Lungs are clear. No pleural fluid. IMPRESSION: 1. No acute findings. 2.  Aortic atherosclerosis  (ICD10-170.0). Electronically Signed   By: Lorin Picket M.D.   On: 09/23/2017 10:58        Scheduled Meds: . arformoterol  15 mcg Nebulization BID  . budesonide (PULMICORT) nebulizer solution  0.5 mg Nebulization BID  . carvedilol  12.5 mg Oral BID WC  . cholecalciferol  1,000 Units Oral Daily  . citalopram  20 mg Oral Daily  . dextromethorphan-guaiFENesin  2 tablet Oral BID  . enoxaparin (LOVENOX) injection  40 mg Subcutaneous Q24H  . fluticasone  2 spray Each Nare Daily  . furosemide  40 mg Oral Daily  . ipratropium-albuterol  3 mL Nebulization Q6H  . loratadine  10 mg Oral Daily  . losartan  25 mg Oral BID  . methylPREDNISolone (SOLU-MEDROL) injection  60 mg Intravenous Q12H  . pantoprazole  40 mg Oral Daily  . spironolactone  25 mg Oral Daily  . vitamin B-12  5,000 mcg Oral Daily   Continuous Infusions:   LOS: 0 days    Time spent: 35 minutes  Irine Seal, MD [facility] Pager [phone]  If 7PM-7AM, please contact night-coverage [email or web address] Password [facility] 09/24/2017, 11:26 AM

## 2017-09-25 DIAGNOSIS — Z95810 Presence of automatic (implantable) cardiac defibrillator: Secondary | ICD-10-CM

## 2017-09-25 DIAGNOSIS — E669 Obesity, unspecified: Secondary | ICD-10-CM

## 2017-09-25 LAB — CBC
HEMATOCRIT: 38.1 % (ref 36.0–46.0)
HEMOGLOBIN: 12.4 g/dL (ref 12.0–15.0)
MCH: 30 pg (ref 26.0–34.0)
MCHC: 32.5 g/dL (ref 30.0–36.0)
MCV: 92.3 fL (ref 78.0–100.0)
Platelets: 231 10*3/uL (ref 150–400)
RBC: 4.13 MIL/uL (ref 3.87–5.11)
RDW: 12.7 % (ref 11.5–15.5)
WBC: 16.3 10*3/uL — AB (ref 4.0–10.5)

## 2017-09-25 LAB — BASIC METABOLIC PANEL
ANION GAP: 6 (ref 5–15)
BUN: 28 mg/dL — AB (ref 6–20)
CHLORIDE: 103 mmol/L (ref 101–111)
CO2: 27 mmol/L (ref 22–32)
Calcium: 9.1 mg/dL (ref 8.9–10.3)
Creatinine, Ser: 0.92 mg/dL (ref 0.44–1.00)
GFR calc Af Amer: >60 mL/min (ref >60)
GFR calc non Af Amer: >60 mL/min (ref >60)
Glucose, Bld: 154 mg/dL — ABNORMAL HIGH (ref 65–99)
POTASSIUM: 4.2 mmol/L (ref 3.5–5.1)
SODIUM: 136 mmol/L (ref 135–145)

## 2017-09-25 MED ORDER — METHYLPREDNISOLONE SODIUM SUCC 125 MG IJ SOLR
60.0000 mg | Freq: Two times a day (BID) | INTRAMUSCULAR | Status: DC
  Administered 2017-09-25 – 2017-09-26 (×2): 60 mg via INTRAVENOUS
  Filled 2017-09-25 (×2): qty 2

## 2017-09-25 NOTE — Progress Notes (Signed)
PROGRESS NOTE    Paula Booker  YNW:[id number] DOB: 1946/02/06 DOA: 09/23/2017 PCP: Patient, No Pcp Per   Brief Narrative:  Patient is a 72 year old female history of cardiomyopathy EF 25% status post ICD, history of GERD, hypertension, arthritis, allergies presented to ED with a 2 to 3-day history of increased wheezing, shortness of breath and nonproductive cough.  Patient admitted for an acute asthma exacerbation.   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  Change IV Solu-Medrol to 60 mg every 12 hours.  Continue Pulmicort, Brovana, Flonase, Mucinex, Claritin, PPI.  Continue empiric IV azithromycin and transition to oral azithromycin in the next 24 hours.  Will likely need outpatient follow-up with pulmonologist.  Follow.    2.  Gastroesophageal reflux disease Continue PPI.  3.  Chronic systolic heart failure/nonischemic cardiomyopathy/history of WPW 2013/status post ICD 2013 Patient with a EF of 25% with severely reduced systolic function status post ICD in place.  EKG with normal sinus rhythm with left bundle branch block unchanged from prior EKG.  Troponin was negative.  Patient with borderline blood pressure yesterday and as such Lasix was held.  Blood pressure has improved.  Resume home dose Lasix.  Continue Coreg, Cozaar, spironolactone, lisinopril.  Outpatient follow-up.    4.  Depression/anxiety Celexa.  5.  Hypertension Stable.  Continue Coreg, Cozaar, spironolactone,  lisinopril.  Lasix dose to be resumed today.     DVT prophylaxis: Lovenox Code Status: Full Family Communication: Updated patient.  No family at bedside. Disposition Plan: Home once clinically improved and close to baseline hopefully tomorrow..   Consultants:   None  Procedures  Chest x-ray 09/23/2017  Antimicrobials:   IV azithromycin 09/24/2017   Subjective: In bed.  Feels shortness of breath is improving however not at baseline.  Feels wheezing is improving.  Still with cough that she states is becoming more productive.  No chest pain.   Objective: Vitals:   09/25/17 0337 09/25/17 0757 09/25/17 0804 09/25/17 0824  BP:    (!) 128/58  Pulse:  75  87  Resp:  16  18  Temp:    98.1 F (36.7 C)  TempSrc:    Oral  SpO2:  98% 98% 93%  Weight: 96.4 kg (212 lb 8.4 oz)     Height:        Intake/Output Summary (Last 24 hours) at 09/25/2017 1138 Last data filed at 09/25/2017 1000 Gross per 24 hour  Intake 1390 ml  Output 1000 ml  Net 390 ml   Filed Weights   09/23/17 2011 09/24/17 0300 09/25/17 0337  Weight: 95.8 kg (211 lb 3.2 oz) 95.8 kg (211 lb 3.2 oz) 96.4 kg (212 lb 8.4 oz)    Examination:  General exam: Appears calm and comfortable  Respiratory system: Decreased inspiratory and expiratory wheezing.  Fair air movement.  No crackles.  No rhonchi.  Speaking in full sentences.  Respiratory effort normal. Cardiovascular system: Regular rate and rhythm no murmurs rubs or gallops.  No JVD.  No lower extremity edema.  Gastrointestinal system: Abdomen is soft, nontender, nondistended, positive bowel sounds.  No rebound.  No guarding.   Central nervous system: Alert and oriented. No focal neurological deficits. Extremities: Symmetric 5 x 5 power. Skin: No rashes, lesions or ulcers Psychiatry: Judgement and insight appear normal. Mood & affect appropriate.     Data Reviewed: I have personally reviewed following labs and imaging studies  CBC: Recent Labs  Lab  09/23/17 1027 09/24/17 0302 09/25/17 0245  WBC 7.9 7.8 16.3*  HGB 13.6 12.6 12.4  HCT 41.1 38.2 38.1  MCV 91.7 93.6 92.3  PLT 261 219 767   Basic Metabolic Panel: Recent Labs  Lab 09/23/17 1027 09/24/17 0302 09/25/17 0245  NA 136 132* 136  K 4.0 3.7 4.2  CL 104 99* 103  CO2 26 23 27   GLUCOSE 133* 189* 154*  BUN 18 27* 28*  CREATININE 0.85 1.06* 0.92  CALCIUM 9.3 9.1 9.1  MG 1.9  --   --    GFR: Estimated Creatinine Clearance: 62.6 mL/min (by C-G formula based on SCr of 0.92 mg/dL). Liver Function Tests: No results for input(s): AST, ALT, ALKPHOS, BILITOT, PROT, ALBUMIN in the last 168 hours. No results for input(s): LIPASE, AMYLASE in the last 168 hours. No results for input(s): AMMONIA in the last 168 hours. Coagulation Profile: No results for input(s): INR, PROTIME in the last 168 hours. Cardiac Enzymes: No results for input(s): CKTOTAL, CKMB, CKMBINDEX, TROPONINI in the last 168 hours. BNP (last 3 results) No results for input(s): PROBNP in the last 8760 hours. HbA1C: No results for input(s): HGBA1C in the last 72 hours. CBG: Recent Labs  Lab 09/23/17 1848  GLUCAP 310*   Lipid Profile: No results for input(s): CHOL, HDL, LDLCALC, TRIG, CHOLHDL, LDLDIRECT in the last 72 hours. Thyroid Function Tests: No results for input(s): TSH, T4TOTAL, FREET4, T3FREE, THYROIDAB in the last 72 hours. Anemia Panel: No results for input(s): VITAMINB12, FOLATE, FERRITIN, TIBC, IRON, RETICCTPCT in the last 72 hours. Sepsis Labs: No results for input(s): PROCALCITON, LATICACIDVEN in the last 168 hours.  No results found for this or any previous visit (from the past 240 hour(s)).       Radiology Studies: No results found.      Scheduled Meds: . arformoterol  15 mcg Nebulization BID  . budesonide (PULMICORT) nebulizer solution  0.5 mg Nebulization BID  . carvedilol  12.5 mg Oral BID WC  . cholecalciferol  1,000 Units Oral Daily  . citalopram  20 mg Oral Daily  .  dextromethorphan-guaiFENesin  2 tablet Oral BID  . enoxaparin (LOVENOX) injection  40 mg Subcutaneous Q24H  . fluticasone  2 spray Each Nare Daily  . furosemide  40 mg Oral Daily  . ipratropium-albuterol  3 mL Nebulization TID  . loratadine  10 mg Oral Daily  . losartan  25 mg Oral BID  . methylPREDNISolone (SOLU-MEDROL) injection  60 mg Intravenous Q8H  . pantoprazole  40 mg Oral Daily  . spironolactone  25 mg Oral Daily  . vitamin B-12  5,000 mcg Oral Daily   Continuous Infusions: . azithromycin Stopped (09/24/17 1408)     LOS: 1 day    Time spent: 35 minutes    Irine Seal, MD [facility] Pager [phone]  If 7PM-7AM, please contact night-coverage [email or web address] Password [facility] 09/25/2017, 11:38 AM

## 2017-09-25 NOTE — Plan of Care (Signed)
Discussed with patient plan of care and going to out of town this Saturday to see grandchildren with some teach back displayed.  Talked about when she gets home she wants to move but may not be at 100% immediately.

## 2017-09-26 LAB — BASIC METABOLIC PANEL
ANION GAP: 6 (ref 5–15)
BUN: 26 mg/dL — ABNORMAL HIGH (ref 6–20)
CALCIUM: 8.8 mg/dL — AB (ref 8.9–10.3)
CO2: 27 mmol/L (ref 22–32)
Chloride: 105 mmol/L (ref 101–111)
Creatinine, Ser: 0.95 mg/dL (ref 0.44–1.00)
GFR calc non Af Amer: 59 mL/min — ABNORMAL LOW (ref >60)
Glucose, Bld: 154 mg/dL — ABNORMAL HIGH (ref 65–99)
Potassium: 3.9 mmol/L (ref 3.5–5.1)
Sodium: 138 mmol/L (ref 135–145)

## 2017-09-26 LAB — CBC
HCT: 39.7 % (ref 36.0–46.0)
HEMOGLOBIN: 12.7 g/dL (ref 12.0–15.0)
MCH: 30.2 pg (ref 26.0–34.0)
MCHC: 32 g/dL (ref 30.0–36.0)
MCV: 94.5 fL (ref 78.0–100.0)
PLATELETS: 231 10*3/uL (ref 150–400)
RBC: 4.2 MIL/uL (ref 3.87–5.11)
RDW: 13.1 % (ref 11.5–15.5)
WBC: 11.7 10*3/uL — ABNORMAL HIGH (ref 4.0–10.5)

## 2017-09-26 MED ORDER — AZITHROMYCIN 250 MG PO TABS
500.0000 mg | ORAL_TABLET | Freq: Every day | ORAL | Status: DC
  Administered 2017-09-26: 500 mg via ORAL
  Filled 2017-09-26: qty 2

## 2017-09-26 MED ORDER — AZITHROMYCIN 250 MG PO TABS: 250.0000 mg | ORAL_TABLET | Freq: Every day | ORAL | 0 refills | Status: AC

## 2017-09-26 MED ORDER — PREDNISONE 20 MG PO TABS: 20.0000 mg | ORAL_TABLET | Freq: Every day | ORAL | 0 refills | Status: DC

## 2017-09-26 MED ORDER — DM-GUAIFENESIN ER 30-600 MG PO TB12: 2.0000 | ORAL_TABLET | Freq: Two times a day (BID) | ORAL | 0 refills | Status: AC

## 2017-09-26 MED ORDER — ALBUTEROL SULFATE HFA 108 (90 BASE) MCG/ACT IN AERS: 2.0000 | INHALATION_SPRAY | Freq: Four times a day (QID) | RESPIRATORY_TRACT | 0 refills | Status: DC | PRN

## 2017-09-26 NOTE — Progress Notes (Addendum)
Occupational Therapy Evaluation Patient Details Name: Paula Booker MRN: [id number] DOB: 01-10-1946 Today's Date: 09/26/2017    History of Present Illness Patient is a 72 year old female history of cardiomyopathy EF 25% status post ICD, history of GERD, hypertension, arthritis, allergies presented to ED with a 2 to 3-day history of increased wheezing, shortness of breath and nonproductive cough.  Patient admitted for an acute asthma exacerbation.   Clinical Impression   PTA, pt lived alone and was independent with ADL and mobility, including driving. Pt appears close to baseline level regarding ADL and functional mobility for ADL. Ability to complete ADL tasks on RA with SpO2 94-97; HR 87-94. Pt safe to DC home from OT standpoint when medically stable. OT signing off.     Follow Up Recommendations  No OT follow up;Supervision - Intermittent    Equipment Recommendations  None recommended by OT    Recommendations for Other Services       Precautions / Restrictions Precautions Precautions: None Restrictions Weight Bearing Restrictions: No      Mobility Bed Mobility Overal bed mobility: Independent                Transfers Overall transfer level: Modified independent               General transfer comment: minimally unsteady  - first time standing "I've been in bed and they wouldn't let me up"    Balance Overall balance assessment: No apparent balance deficits (not formally assessed)                                         ADL either performed or assessed with clinical judgement   ADL Overall ADL's : At baseline                                       General ADL Comments: Educated on energy conservation strategies; pursed lip breathing; increasing activity slowly and monitoring breathing; reducing risk of falls at home; pt able to verbalize understanding and discussed her strategies with this therapist regarding her activity  level and diet/exercise. Pt states " I let this go on too long before I went to the doctor". Educated pt on management on symptoms and importance of notifying PCP in order to reduce risk of exacerbations. Pt verbalized understanding.     Vision Baseline Vision/History: Wears glasses       Perception     Praxis      Pertinent Vitals/Pain Pain Assessment: 0-10 Pain Score: 2  Pain Location: headache Pain Descriptors / Indicators: Headache Pain Intervention(s): Limited activity within patient's tolerance     Hand Dominance Right   Extremity/Trunk Assessment Upper Extremity Assessment Upper Extremity Assessment: Overall WFL for tasks assessed   Lower Extremity Assessment Lower Extremity Assessment: Defer to PT evaluation   Cervical / Trunk Assessment Cervical / Trunk Assessment: Normal   Communication Communication Communication: No difficulties   Cognition Arousal/Alertness: Awake/alert Behavior During Therapy: WFL for tasks assessed/performed Overall Cognitive Status: Within Functional Limits for tasks assessed                                     General Comments       Exercises     Shoulder  Instructions      Home Living Family/patient expects to be discharged to:: Private residence Living Arrangements: Alone Available Help at Discharge: Family;Available PRN/intermittently Type of Home: House Home Access: Stairs to enter;Ramped entrance(ramp on rear) Entrance Stairs-Number of Steps: 4 Entrance Stairs-Rails: Right Home Layout: Two level;Able to live on main level with bedroom/bathroom     Bathroom Shower/Tub: Tub/shower unit;Curtain   Bathroom Toilet: Handicapped height Bathroom Accessibility: Yes How Accessible: Accessible via wheelchair Home Equipment: None          Prior Functioning/Environment Level of Independence: Independent        Comments: drives        OT Problem List: Decreased activity tolerance      OT  Treatment/Interventions:      OT Goals(Current goals can be found in the care plan section) Acute Rehab OT Goals Patient Stated Goal: to go to her grandson's game this weekend OT Goal Formulation: All assessment and education complete, DC therapy  OT Frequency:     Barriers to D/C:            Co-evaluation              AM-PAC PT "6 Clicks" Daily Activity     Outcome Measure Help from another person eating meals?: None Help from another person taking care of personal grooming?: None Help from another person toileting, which includes using toliet, bedpan, or urinal?: None Help from another person bathing (including washing, rinsing, drying)?: None Help from another person to put on and taking off regular upper body clothing?: None Help from another person to put on and taking off regular lower body clothing?: None 6 Click Score: 24   End of Session Equipment Utilized During Treatment: Gait belt Nurse Communication: Mobility status  Activity Tolerance: Patient tolerated treatment well Patient left: in chair;with call bell/phone within reach  OT Visit Diagnosis: Other (comment)(Decreased endurance)                Time: 1155-2080 OT Time Calculation (min): 17 min Charges:  OT General Charges $OT Visit: 1 Visit OT Evaluation $OT Eval Low Complexity: 1 Low G-Codes:     Maurie Boettcher, OT/L  OT Clinical Specialist [phone]   [facility] 09/26/2017, 9:20 AM

## 2017-09-26 NOTE — Consult Note (Signed)
[facility] CM Primary Care Navigator  09/26/2017  Paula Booker 1945-09-10 [id number]   Went to seepatient at the bedsideto identify possible discharge needs but she was alreadydischargedhome per staff.   Per chart review, patient was admitted and treated for shortness of breath secondary to acute asthma exacerbation.  . Primary care provider's officeis listed as providing transition of care (TOC) follow-up.  Patient has discharge instruction to follow-up with pulmonologist ( Dr. Isaiah Serge) in 2 weeks.   For additional questions please contact:  Karin Golden A. Mortimer Bair, BSN, RN-BC [facility] PRIMARY CARE Navigator Cell: [phone]

## 2017-09-26 NOTE — Discharge Instructions (Signed)

## 2017-09-26 NOTE — Evaluation (Signed)
Physical Therapy Evaluation Patient Details Name: Paula Booker MRN: [id number] DOB: [birth year] Today's Date: 09/26/2017   History of Present Illness  Patient is a 72 year old female history of cardiomyopathy EF 25% status post ICD, history of GERD, hypertension, arthritis, allergies presented to ED with a 2 to 3-day history of increased wheezing, shortness of breath and nonproductive cough.  Patient admitted for an acute asthma exacerbation.  Clinical Impression  Patient evaluated by Physical Therapy with no further acute PT needs identified. All education has been completed and the patient has no further questions. Pt is independent with bed mobility, modI for transfers and ambulation without AD, and is supervision for ascent/descent of 10 steps with 1 rail assist. See below for any follow-up Physical Therapy or equipment needs. PT is signing off. Thank you for this referral.     Follow Up Recommendations No PT follow up    Equipment Recommendations  None recommended by PT    Recommendations for Other Services       Precautions / Restrictions Precautions Precautions: None Restrictions Weight Bearing Restrictions: No      Mobility  Bed Mobility Overal bed mobility: Independent                Transfers Overall transfer level: Modified independent               General transfer comment: good power up and steadying in standing  Ambulation/Gait Ambulation/Gait assistance: Modified independent (Device/Increase time) Gait Distance (Feet): 300 Feet Assistive device: None Gait Pattern/deviations: Step-through pattern;WFL(Within Functional Limits) Gait velocity: WFL Gait velocity interpretation: >2.62 ft/sec, indicative of community ambulatory General Gait Details: mod I for mild instability with higher level balance  Stairs Stairs: Yes Stairs assistance: Supervision Stair Management: One rail Left Number of Stairs: 10 General stair comments: supervision for  safety, turns slightly to side with descent secondary to increased R knee OA pain      Balance Overall balance assessment: No apparent balance deficits (not formally assessed)                               Standardized Balance Assessment Standardized Balance Assessment : Dynamic Gait Index   Dynamic Gait Index Level Surface: Normal Change in Gait Speed: Mild Impairment Gait with Horizontal Head Turns: Normal Gait with Vertical Head Turns: Normal Gait and Pivot Turn: Mild Impairment Step Over Obstacle: Normal Step Around Obstacles: Normal Steps: Moderate Impairment Total Score: 20       Pertinent Vitals/Pain Pain Assessment: 0-10 Pain Score: 2  Pain Location: headache Pain Descriptors / Indicators: Headache Pain Intervention(s): Limited activity within patient's tolerance    Home Living Family/patient expects to be discharged to:: Private residence Living Arrangements: Alone Available Help at Discharge: Family;Available PRN/intermittently Type of Home: House Home Access: Stairs to enter;Ramped entrance(ramp on rear) Entrance Stairs-Rails: Right Entrance Stairs-Number of Steps: 4 Home Layout: Two level;Able to live on main level with bedroom/bathroom Home Equipment: None      Prior Function Level of Independence: Independent         Comments: drives     Hand Dominance   Dominant Hand: Right    Extremity/Trunk Assessment   Upper Extremity Assessment Upper Extremity Assessment: Defer to OT evaluation    Lower Extremity Assessment Lower Extremity Assessment: Overall WFL for tasks assessed    Cervical / Trunk Assessment Cervical / Trunk Assessment: Normal  Communication   Communication: No difficulties  Cognition Arousal/Alertness: Awake/alert Behavior During  Therapy: WFL for tasks assessed/performed Overall Cognitive Status: Within Functional Limits for tasks assessed                                        General  Comments General comments (skin integrity, edema, etc.): at rest SaO2 on RA 98%, with stair training SaO2 dropped to 95%O2, VSS        Assessment/Plan    PT Assessment Patent does not need any further PT services  PT Problem List         PT Treatment Interventions      PT Goals (Current goals can be found in the Care Plan section)  Acute Rehab PT Goals Patient Stated Goal: to go to her grandson's game this weekend PT Goal Formulation: With patient     AM-PAC PT "6 Clicks" Daily Activity  Outcome Measure Difficulty turning over in bed (including adjusting bedclothes, sheets and blankets)?: None Difficulty moving from lying on back to sitting on the side of the bed? : None Difficulty sitting down on and standing up from a chair with arms (e.g., wheelchair, bedside commode, etc,.)?: None Help needed moving to and from a bed to chair (including a wheelchair)?: None Help needed walking in hospital room?: None Help needed climbing 3-5 steps with a railing? : A Little 6 Click Score: 23    End of Session Equipment Utilized During Treatment: Gait belt Activity Tolerance: Patient tolerated treatment well Patient left: in chair;with call bell/phone within reach Nurse Communication: Mobility status PT Visit Diagnosis: Other abnormalities of gait and mobility (R26.89)    Time: 0102-7253 PT Time Calculation (min) (ACUTE ONLY): 13 min   Charges:   PT Evaluation $PT Eval Moderate Complexity: 1 Mod     PT G Codes:        Teagon Kron B. Beverely Risen PT, DPT Acute Rehabilitation  [phone] Pager [phone]   [facility] [place] 09/26/2017, 10:19 AM

## 2017-09-26 NOTE — Discharge Summary (Signed)
Physician Discharge Summary  Paula Booker XTK:[id number] DOB: 01-08-1946 DOA: 09/23/2017  PCP: Patient, No Pcp Per  Admit date: 09/23/2017 Discharge date: 09/26/2017  Time spent: 55 minutes  Recommendations for Outpatient Follow-up:  1. Follow-up with Dr. Vaughan Browner, pulmonologist in 2 weeks.  On follow-up patient's asthma will need to be followed up upon and reassessed.   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 Joan Booker is a 72 y.o. female with medical history significant for cardiomyopathy, with EF 25%, on ICD, history of GERD, hypertension, arthritis, history of environmental allergies, asthma presenting with 2 to 3-day history of increased wheezing, shortness of breath, and nonproductive cough.  Symptoms were preceded by sore throat, but she was unaware of any sick contacts.  She also denied any changes in her environment but on questioning, she had been having remodeling her kitchen cabinets.  Symptoms were moderate to severe, and they seem to be worsening.  She also reported some head congestion.  She denied any fever, chills or night sweats.  At home, the patient tried albuterol, with slight improvement of her symptoms.  She had some chest tightness, but no frank chest pain, or palpitations.  She denied any lower extremity swelling, or calf pain.  She denied any  recent long distance trips.  She denied any recent surgeries, prolonged mobility or hospitalizations.  She denied any tobacco, alcohol or recreational drug use.  Denied any history of PE or DVT.she was last seen by her pulmonologist, Dr. Vaughan Browner 09/11/2017, and she did mention that her symptoms were worsening actually over the last 3 months and as outpatient, PFTs showed significant reduction in the mild flow rates, with bronchodilator response.  She is up-to-date with her influenza and Pneumovax vaccine   ED Course:  BP (!) 141/125   Pulse 80   Temp 98.4 F (36.9 C) (Oral)   Resp 19   Ht 5' 3.5" (1.613 m)   Wt 89.8 kg (198 lb)   SpO2 99%   BMI 34.52 kg/m   Sodium 136, potassium 4.0, chloride 104, bicarb 26, glucose 133.  BUN 18, creatinine 0.85.  Calcium 9.3, White count 7.9, hemoglobin 13.6, platelets 261 Troponin is 0 Chest x-ray shows no acute findings. EKG normal sinus rhythm, left bundle branch block, no significant change since last tracing. Patient received 3 nebulizer treatments, the third 1 being more effective than the prior.  She also received Atrovent, Solu-Medrol 125 mg at 1130 this morning. The patient continues to have diffuse wheezing, despite the above nebulizers,      Hospital Course:  1 shortness of breath secondary to acute asthma exacerbation Patient was admitted with shortness of breath felt secondary to an acute asthma exacerbation.  Patient on admission was noted to have poor air movement as well as expiratory and expiratory wheezing.  Patient was  placed on IV steroids, scheduled nebs.  Pulmicort and Brovana as well as Flonase Mucinex and Claritin was added to patient's regimen.  Patient was also placed on IV azithromycin.  Patient improved clinically and IV Solu-Medrol will be transitioned to oral prednisone taper on discharge.  Patient also be discharged home on 5 more days of oral azithromycin as well as Flonase, Claritin, Mucinex, PPI.  Patient be discharged  home on her albuterol inhaler to use 3 times daily for the next 5 days and then as needed.  Patient is to follow-up with her pulmonologist in 2 weeks.  Patient will be discharged in stable and improved condition.    2.  Gastroesophageal reflux disease Maintained on a PPI.    3.  Chronic systolic heart failure/nonischemic cardiomyopathy/history of WPW 2013/status post ICD 2013 Patient with a EF of 25% with severely reduced systolic function status post ICD in place.  EKG with normal sinus rhythm with left bundle branch block unchanged from prior EKG.  Troponin was negative.  Patient with borderline blood pressure on admission and as such Lasix was held.  Blood pressure improved.  Lasix dose was resumed.  Patient was maintained on her cardiac medications o Coreg, Cozaar, spironolactone, lisinopril.  Outpatient follow-up.    4.  Depression/anxiety Celexa.  5.  Hypertension Patient maintained on home regimen of Continue Coreg, Cozaar, spironolactone, lisinopril.  Patient's diuretics of Lasix were held and resumed during the hospitalization.  Blood pressure remained stable.  Outpatient follow-up.     Procedures:  Chest x-ray 09/23/2017      Consultations:  None  Discharge Exam: Vitals:   09/26/17 0833 09/26/17 0836  BP: 126/63   Pulse: 85   Resp: 18   Temp: 97.8 F (36.6 C)   SpO2: 96% 98%    General: NAD Cardiovascular: RRR Respiratory: CTAB  Discharge Instructions   Discharge Instructions    Diet - low sodium heart healthy   Complete by:  As directed    Increase activity slowly   Complete by:  As directed      Allergies as of 09/26/2017      Reactions   Ivp Dye [iodinated Diagnostic Agents] Anaphylaxis, Hives, Swelling, Other (See Comments)   Throat swell   Penicillins Anaphylaxis   Has patient had a PCN reaction causing immediate rash, facial/tongue/throat swelling, SOB or lightheadedness with hypotension: No Has patient had a PCN reaction causing severe  rash involving mucus membranes or skin necrosis: No Has patient had a PCN reaction that required hospitalization No Has patient had a PCN reaction occurring within the last 10 years:No If all of the above answers are "NO", then may proceed with Cephalosporin use.   Sulfonamide Derivatives Other (See Comments)   Migraines   Tessalon [benzonatate] Rash, Hives   Severe rash   Sulfa Antibiotics Other (See Comments)   Migraine   Hydrocodone-acetaminophen Other (See Comments)   Penicillin G Other (See Comments)   Rosuvastatin Calcium Other (See Comments)   Onion Other (See Comments)   Raw onions cause migraines      Medication List    STOP taking these medications   ranitidine 150 MG tablet Commonly known as:  ZANTAC     TAKE these medications   albuterol 108 (90 Base) MCG/ACT inhaler Commonly known as:  PROVENTIL HFA;VENTOLIN HFA Inhale 2 puffs into the lungs every 6 (six) hours as needed. For wheezing. Use 2 puffs 3 times daily x5 days and then every 6 hours as needed. What changed:  additional  instructions   alendronate 70 MG tablet Commonly known as:  FOSAMAX Take 70 mg by mouth once a week.   azithromycin 250 MG tablet Commonly known as:  ZITHROMAX Take 1 tablet (250 mg total) by mouth daily for 5 days.   carvedilol 12.5 MG tablet Commonly known as:  COREG TAKE 1 TABLET BY MOUTH TWICE DAILY WITH A MEAL What changed:  See the new instructions.   cetirizine 10 MG tablet Commonly known as:  ZYRTEC Take 10 mg by mouth daily.   citalopram 20 MG tablet Commonly known as:  CELEXA Take 20 mg by mouth daily.   dextromethorphan-guaiFENesin 30-600 MG 12hr tablet Commonly known as:  MUCINEX DM Take 2 tablets by mouth 2 (two) times daily for 5 days.   fluticasone 50 MCG/ACT nasal spray Commonly known as:  FLONASE Place 2 sprays into both nostrils daily.   fluticasone furoate-vilanterol 200-25 MCG/INH Aepb Commonly known as:  BREO ELLIPTA Inhale 1 puff into the lungs  daily.   furosemide 40 MG tablet Commonly known as:  LASIX TAKE 1 TO 2 TABLETS BY MOUTH DAILY AS DIRECTED FOR EDEMA, SHORTNESS OF BREATH AND/OR WEIGHT GAIN What changed:  See the new instructions.   losartan 25 MG tablet Commonly known as:  COZAAR TAKE 1 TABLET BY MOUTH TWICE DAILY What changed:    how much to take  how to take this  when to take this   ondansetron 4 MG disintegrating tablet Commonly known as:  ZOFRAN ODT Take 1 tablet (4 mg total) by mouth every 8 (eight) hours as needed for nausea or vomiting.   pantoprazole 40 MG tablet Commonly known as:  PROTONIX Take 40 mg by mouth daily.   predniSONE 20 MG tablet Commonly known as:  DELTASONE Take 1-3 tablets (20-60 mg total) by mouth daily with breakfast. Take 3 tablets (60mg )  x 3 days, then 2 tablets (40mg ) daily x3 days, then 1 tablet (20mg ) 3 days then stop.   spironolactone 25 MG tablet Commonly known as:  ALDACTONE TAKE 1 TABLET BY MOUTH ONCE DAILY What changed:    how much to take  how to take this  when to take this   Vitamin B-12 5000 MCG Lozg Take 5,000 mcg by mouth daily.   Vitamin D3 1000 units Caps Take 1,000 Units by mouth daily.      Allergies  Allergen Reactions  . Ivp Dye [Iodinated Diagnostic Agents] Anaphylaxis, Hives, Swelling and Other (See Comments)    Throat swell  . Penicillins Anaphylaxis    Has patient had a PCN reaction causing immediate rash, facial/tongue/throat swelling, SOB or lightheadedness with hypotension: No Has patient had a PCN reaction causing severe rash involving mucus membranes or skin necrosis: No Has patient had a PCN reaction that required hospitalization No Has patient had a PCN reaction occurring within the last 10 years:No If all of the above answers are "NO", then may proceed with Cephalosporin use.  . Sulfonamide Derivatives Other (See Comments)    Migraines  . Tessalon [Benzonatate] Rash and Hives    Severe rash  . Sulfa Antibiotics Other (See  Comments)    Migraine  . Hydrocodone-Acetaminophen Other (See Comments)  . Penicillin G Other (See Comments)  . Rosuvastatin Calcium Other (See Comments)  . Onion Other (See Comments)    Raw onions cause migraines   Follow-up Information    Mannam, Praveen, MD. Schedule an appointment as soon as possible for a visit in 2 week(s).   Specialty:  Pulmonary Disease  Contact information: [address] [place] [address] [phone]            The results of significant diagnostics from this hospitalization (including imaging, microbiology, ancillary and laboratory) are listed below for reference.    Significant Diagnostic Studies: Dg Chest 2 View  Result Date: 09/23/2017 CLINICAL DATA:  Shortness of breath and cough. EXAM: CHEST - 2 VIEW COMPARISON:  07/27/2017. FINDINGS: Trachea is midline. Pacemaker and ICD lead tips are in the right atrium and right ventricle, respectively. Heart size normal. Thoracic aorta is calcified. Lungs are clear. No pleural fluid. IMPRESSION: 1. No acute findings. 2.  Aortic atherosclerosis (ICD10-170.0). Electronically Signed   By: Lorin Picket M.D.   On: 09/23/2017 10:58    Microbiology: No results found for this or any previous visit (from the past 240 hour(s)).   Labs: Basic Metabolic Panel: Recent Labs  Lab 09/23/17 1027 09/24/17 0302 09/25/17 0245 09/26/17 0435  NA 136 132* 136 138  K 4.0 3.7 4.2 3.9  CL 104 99* 103 105  CO2 26 23 27 27   GLUCOSE 133* 189* 154* 154*  BUN 18 27* 28* 26*  CREATININE 0.85 1.06* 0.92 0.95  CALCIUM 9.3 9.1 9.1 8.8*  MG 1.9  --   --   --    Liver Function Tests: No results for input(s): AST, ALT, ALKPHOS, BILITOT, PROT, ALBUMIN in the last 168 hours. No results for input(s): LIPASE, AMYLASE in the last 168 hours. No results for input(s): AMMONIA in the last 168 hours. CBC: Recent Labs  Lab 09/23/17 1027 09/24/17 0302 09/25/17 0245 09/26/17 0435  WBC 7.9 7.8 16.3* 11.7*  HGB 13.6  12.6 12.4 12.7  HCT 41.1 38.2 38.1 39.7  MCV 91.7 93.6 92.3 94.5  PLT 261 219 231 231   Cardiac Enzymes: No results for input(s): CKTOTAL, CKMB, CKMBINDEX, TROPONINI in the last 168 hours. BNP: BNP (last 3 results) No results for input(s): BNP in the last 8760 hours.  ProBNP (last 3 results) No results for input(s): PROBNP in the last 8760 hours.  CBG: Recent Labs  Lab 09/23/17 1848  GLUCAP 310*       Signed:  Irine Seal MD.  [facility] 09/26/2017, 10:51 AM

## 2017-10-04 ENCOUNTER — Inpatient Hospital Stay: Payer: Medicare HMO | Admitting: Pulmonary Disease

## 2017-10-09 ENCOUNTER — Encounter: Payer: Self-pay | Admitting: Pulmonary Disease

## 2017-10-09 ENCOUNTER — Ambulatory Visit (INDEPENDENT_AMBULATORY_CARE_PROVIDER_SITE_OTHER): Payer: Medicare HMO | Admitting: Pulmonary Disease

## 2017-10-09 VITALS — BP 140/82 | HR 83 | Ht 64.0 in | Wt 198.0 lb

## 2017-10-09 DIAGNOSIS — J4530 Mild persistent asthma, uncomplicated: Secondary | ICD-10-CM | POA: Diagnosis not present

## 2017-10-09 DIAGNOSIS — K219 Gastro-esophageal reflux disease without esophagitis: Secondary | ICD-10-CM | POA: Diagnosis not present

## 2017-10-09 DIAGNOSIS — E669 Obesity, unspecified: Secondary | ICD-10-CM | POA: Diagnosis not present

## 2017-10-09 MED ORDER — IPRATROPIUM-ALBUTEROL 0.5-2.5 (3) MG/3ML IN SOLN: 3.0000 mL | RESPIRATORY_TRACT | 1 refills | Status: DC | PRN

## 2017-10-09 NOTE — Assessment & Plan Note (Signed)
Continue Protonix Continue to avoid spicy, fattening, other foods that exacerbate your GERD

## 2017-10-09 NOTE — Assessment & Plan Note (Signed)
Continue to increase daily exercise, working towards a healthy weight

## 2017-10-09 NOTE — Progress Notes (Signed)
@Patient  ID: Paula Booker, female    DOB: 06-22-1945, 72 y.o.   MRN: [id number]  Chief Complaint  Patient presents with  . Hospitalization Follow-up    Reports she is better but chest still feels tight and she does not feel like she can a deep breath. Breo every morning and albuterol 3x/day. States she is still very fatigued. Finished prednisone and ABT last week.     Referring provider: [name], MD  HPI: 72 year old never smoker with history of nonischemic cardiomyopathy, syncope, WPW syndrome status post ICD implant, asthma.  Managed with Breo inhaler.  Patient of Dr. Vaughan Browner.  Recent [place] [facility] Encounters:   09/11/17-[facility] [place]  Interval history: Started on breo at last visit.  States that breathing is improved considerably on this inhaler She is has not needed to use her albuterol inhaler 72 Y/O with history of nonischemic cardiomyopathy, syncope, WPW syndrome status post ICD implant, asthma.  She is complains of dyspnea since 2001 but reports worsening symptoms over the past 3 months. She has been evaluated in the pulmonary office in 2014 with normal PFTs. She was given [facility] inhaler for her asthma but does not appear to have improved her symptoms. Her chief complaint is dyspnea with activity and at rest, cough with no sputum production, occasional wheezing, denies any fevers, chills, hemoptysis. As noted by Dr. Sheryn Bison, PCP she has been active at home remodeling her kitchen cabinets without any issue. She recently got a Fitbit fitness tracker and is currently doing 2500 steps per day. She has plans to increase it to 10,000 steps per day. She has also lost 6 pounds recently and plans on losing 6 more pounds. Current meds include albuterol inhaler which she uses rarely. Pets: 3 cats. No dogs, birds, exotic animals Occupation: Retired. She used to work as a Engineer, manufacturing systems, Pharmacist, hospital, Optometrist Exposures: No mold issues, no exposure to asbestos. No hot tubs, Jacuzzi at  home Smoking history: Never smoker Plan: Continue Breo, continue albuterol as needed, encouraged weight loss and exercise as some symptoms are due to obesity and deconditioning, continue Prilosec and follow-up with [facility] next month   Tests:   FENO 09/20/16- 19  Spirometry 05/10/12 FVC 2.15 [75%), FEV1 1.64 [72%), F/F 76  PFTs 10/17/12 FVC 2.24 [80%), FEV1 1.75 [70%), F/F 78, TLC 88%, DLCO 93% Normal study  PFTs 01/26/17 FVC 2.21 [77%], FEV1 1.65 [77%], F/F 75, TLC 86%, DLCO 86% Small airways disease with significant bronchodilator response  Peak inspiratory flow 09/11/2017-60  Imaging:  CT chest 05/22/14-mild bilateral groundglass opacities consistent with edema CT abdomen, pelvis 03/18/15-left basilar consolidation Chest x-ray 07/27/2017- no acute cardiopulmonary normality. 09/23/2017-chest x-ray-no acute findings, lungs are clear   Cardiac:   Labs:  Blood allergy profile 01/26/2017-IgE 13, RAST panel shows sensitivity to dust mite CBC 01/26/2017-WBC 8.5, eos 4.7%, absolute eosinophil count 400   Micro:   Chart Review:  09/23/2017-hospitalization-shortness of breath/moderate persistent asthma >>>Discharged 09/26/2017 >>>Improved clinically with IV Solu-Medrol, transition to oral prednisone on discharge, sent home with 5 days of azithromycin     10/09/17 HFU   Pleasant patient seen in office today for hospital follow-up.  Patient of Dr. Vaughan Browner.  Patient reports that she was discharged from the hospital on 09/26/2017.  Chart review confirms this.  Patient has finished prednisone as well as azithromycin.  Patient reports that she never really felt much better even on the steroids.  Patient still feels that she is just very tired and fatigued.  Patient does report she  has not been able to exercise like she usually was doing.  Patient delayed GI work-up from [facility] this will now take place on 12/06/2017.  Allergies  Allergen Reactions  . Ivp Dye [Iodinated Diagnostic Agents]  Anaphylaxis, Hives, Swelling and Other (See Comments)    Throat swell  . Penicillins Anaphylaxis    Has patient had a PCN reaction causing immediate rash, facial/tongue/throat swelling, SOB or lightheadedness with hypotension: No Has patient had a PCN reaction causing severe rash involving mucus membranes or skin necrosis: No Has patient had a PCN reaction that required hospitalization No Has patient had a PCN reaction occurring within the last 10 years:No If all of the above answers are "NO", then may proceed with Cephalosporin use.  . Sulfonamide Derivatives Other (See Comments)    Migraines  . Tessalon [Benzonatate] Rash and Hives    Severe rash  . Sulfa Antibiotics Other (See Comments)    Migraine  . Hydrocodone-Acetaminophen Other (See Comments)  . Penicillin G Other (See Comments)  . Rosuvastatin Calcium Other (See Comments)  . Onion Other (See Comments)    Raw onions cause migraines    Immunization History  Administered Date(s) Administered  . Influenza,inj,Quad PF,6+ Mos 03/19/2015  . Influenza-Unspecified 03/13/2012, 01/13/2017  . Pneumococcal Polysaccharide-23 03/19/2015    Past Medical History:  Diagnosis Date  . Anxiety   . Arthritis    "hands and knees" (03/22/2012)  . Asthma   . CHF (congestive heart failure) ([place]) 6/13  . Depression   . GERD (gastroesophageal reflux disease)   . Head injury, acute, with loss of consciousness ([place])   . Hypertension   . ICD (implantable cardiac defibrillator) in place Dec 2013  . Migraines   . NICM (nonischemic cardiomyopathy) ([place])   . Obesity (BMI 30-39.9)    negative sleep study 2014  . Pacemaker   . Pneumonia    hx  . Shortness of breath    "@ any time I can get SOB" (03/22/2012)  . Skin cancer 1999  . WPW (Wolff-Parkinson-White syndrome)     Tobacco History: Social History   Tobacco Use  Smoking Status Never Smoker  Smokeless Tobacco Never Used   Counseling given: Yes Continue to not smoke. Avoid smoke.     Outpatient Encounter Medications as of 10/09/2017  Medication Sig  . albuterol (PROVENTIL HFA;VENTOLIN HFA) 108 (90 Base) MCG/ACT inhaler Inhale 2 puffs into the lungs every 6 (six) hours as needed. For wheezing. Use 2 puffs 3 times daily x5 days and then every 6 hours as needed.  Marland Kitchen alendronate (FOSAMAX) 70 MG tablet Take 70 mg by mouth once a week.   . carvedilol (COREG) 12.5 MG tablet TAKE 1 TABLET BY MOUTH TWICE DAILY WITH A MEAL (Patient taking differently: TAKE 1 TABLET ( 12.5 MG TOTALLY) MOUTH TWICE DAILY WITH A MEAL)  . cetirizine (ZYRTEC) 10 MG tablet Take 10 mg by mouth daily.  . Cholecalciferol (VITAMIN D3) 1000 UNITS CAPS Take 1,000 Units by mouth daily.   . citalopram (CELEXA) 20 MG tablet Take 20 mg by mouth daily.   . Cyanocobalamin (VITAMIN B-12) 5000 MCG LOZG Take 5,000 mcg by mouth daily.  . fluticasone (FLONASE) 50 MCG/ACT nasal spray Place 2 sprays into both nostrils daily.  . fluticasone furoate-vilanterol (BREO ELLIPTA) 200-25 MCG/INH AEPB Inhale 1 puff into the lungs daily.  . furosemide (LASIX) 40 MG tablet TAKE 1 TO 2 TABLETS BY MOUTH DAILY AS DIRECTED FOR EDEMA, SHORTNESS OF BREATH AND/OR WEIGHT GAIN (Patient taking differently:  TAKE 2 TABLETS (80 MG TOTALLY) BY MOUTH EVERY MORNING)  . losartan (COZAAR) 25 MG tablet TAKE 1 TABLET BY MOUTH TWICE DAILY (Patient taking differently: TAKE 1 TABLET (25 MG TOTALLY) BY MOUTH TWICE DAILY)  . ondansetron (ZOFRAN ODT) 4 MG disintegrating tablet Take 1 tablet (4 mg total) by mouth every 8 (eight) hours as needed for nausea or vomiting.  . pantoprazole (PROTONIX) 40 MG tablet Take 40 mg by mouth daily.  . predniSONE (DELTASONE) 20 MG tablet Take 1-3 tablets (20-60 mg total) by mouth daily with breakfast. Take 3 tablets (60mg )  x 3 days, then 2 tablets (40mg ) daily x3 days, then 1 tablet (20mg ) 3 days then stop.  Marland Kitchen spironolactone (ALDACTONE) 25 MG tablet TAKE 1 TABLET BY MOUTH ONCE DAILY (Patient taking differently: TAKE 1 TABLET (25 MG  TOTALLY) BY MOUTH ONCE DAILY)  . ipratropium-albuterol (DUONEB) 0.5-2.5 (3) MG/3ML SOLN Take 3 mLs by nebulization every 4 (four) hours as needed.   No facility-administered encounter medications on file as of 10/09/2017.      Review of Systems  Constitutional: +fatigue   No  weight loss, night sweats,  fevers, chills HEENT:   No headaches,  Difficulty swallowing,  Tooth/dental problems, or  Sore throat, No sneezing, itching, ear ache, nasal congestion, post nasal drip  CV: No chest pain,  orthopnea, PND, swelling in lower extremities, anasarca, dizziness, palpitations, syncope  GI: +indigestion, heartburn  No abdominal pain, nausea, vomiting, diarrhea, change in bowel habits, loss of appetite, bloody stools Resp: +continued sob with rest and exercise   No excess mucus, no productive cough,  No non-productive cough,  No coughing up of blood.  No change in color of mucus.  No wheezing.  No chest wall deformity Skin: no rash, lesions, no skin changes. GU: no dysuria, change in color of urine, no urgency or frequency.  No flank pain, no hematuria  MS:  No joint pain or swelling.  No decreased range of motion.  No back pain. Psych:  No change in mood or affect. No depression or anxiety.  No memory loss.   Physical Exam  BP 140/82   Pulse 83   Ht 5\' 4"  (1.626 m)   Wt 198 lb (89.8 kg)   SpO2 95%   BMI 33.99 kg/m    Wt Readings from Last 3 Encounters:  10/09/17 198 lb (89.8 kg)  09/26/17 214 lb 8.1 oz (97.3 kg)  09/11/17 201 lb (91.2 kg)    GEN: A/Ox3; pleasant , NAD, well nourished    HEENT:  [place]/AT,  EACs-clear, TMs-wnl, NOSE-clear, THROAT-clear, no lesions, no postnasal drip or exudate noted.   NECK:  Supple w/ fair ROM; no JVD; normal carotid impulses w/o bruits; no thyromegaly or nodules palpated; +slightly tender left submandibular lymphadenopathy.    RESP:  Clear  P & A; air movement in all lobes, no wheeze heard   no accessory muscle use, no dullness to percussion  CARD:   RRR, no m/r/g, no peripheral edema, pulses intact, no cyanosis or clubbing.  GI:   Soft & nt; nml bowel sounds; no organomegaly or masses detected.   Musco: Warm bil, no deformities or joint swelling noted.   Neuro: alert, no focal deficits noted.    Skin: Warm, no lesions or rashes    Lab Results:  CBC    Component Value Date/Time   WBC 11.7 (H) 09/26/2017 0435   RBC 4.20 09/26/2017 0435   HGB 12.7 09/26/2017 0435   HCT 39.7 09/26/2017 0435  PLT 231 09/26/2017 0435   MCV 94.5 09/26/2017 0435   MCH 30.2 09/26/2017 0435   MCHC 32.0 09/26/2017 0435   RDW 13.1 09/26/2017 0435   LYMPHSABS 3.1 07/27/2017 1539   MONOABS 0.6 07/27/2017 1539   EOSABS 0.2 07/27/2017 1539   BASOSABS 0.0 07/27/2017 1539    BMET    Component Value Date/Time   NA 138 09/26/2017 0435   K 3.9 09/26/2017 0435   CL 105 09/26/2017 0435   CO2 27 09/26/2017 0435   GLUCOSE 154 (H) 09/26/2017 0435   BUN 26 (H) 09/26/2017 0435   CREATININE 0.95 09/26/2017 0435   CALCIUM 8.8 (L) 09/26/2017 0435   GFRNONAA 59 (L) 09/26/2017 0435   GFRAA >60 09/26/2017 0435    BNP    Component Value Date/Time   BNP 65.5 04/05/2016 1556    ProBNP    Component Value Date/Time   PROBNP 52.0 10/08/2014 1613    Imaging: Dg Chest 2 View  Result Date: 09/23/2017 CLINICAL DATA:  Shortness of breath and cough. EXAM: CHEST - 2 VIEW COMPARISON:  07/27/2017. FINDINGS: Trachea is midline. Pacemaker and ICD lead tips are in the right atrium and right ventricle, respectively. Heart size normal. Thoracic aorta is calcified. Lungs are clear. No pleural fluid. IMPRESSION: 1. No acute findings. 2.  Aortic atherosclerosis (ICD10-170.0). Electronically Signed   By: Lorin Picket M.D.   On: 09/23/2017 10:58     Assessment & Plan:   Pleasant 72 year old female patient seen office today.  I do not believe she is an exacerbation.  Good air movement on exam with no wheeze.  I do not believe the patient needs any additional  steroids at this time.  Will get patient started with nebulized treatments that she can use as needed at home.  We will have patient have close follow-up with Dr. Vaughan Browner in 4 weeks.  Continue Breo inhaler.    GERD (gastroesophageal reflux disease) Continue Protonix Continue to avoid spicy, fattening, other foods that exacerbate your GERD  Obesity (BMI 30-39.9) Continue to increase daily exercise, working towards a healthy weight  Asthma in adult, mild persistent, uncomplicated Continue Breo inhaler Continue rescue inhaler Will start DuoNeb nebulized treatments the patient can use every 6 hours as needed for shortness of breath/wheezing >>> Discussed with patient that albuterol is in this medication so not to use in conjunction with rescue inhaler Emphasized the importance of patient avoiding triggers Emphasized the importance of patient increasing physical activity For a week follow-up with Dr. Tomi Bamberger, NP 10/09/2017

## 2017-10-09 NOTE — Assessment & Plan Note (Signed)
Continue Breo inhaler Continue rescue inhaler Will start DuoNeb nebulized treatments the patient can use every 6 hours as needed for shortness of breath/wheezing >>> Discussed with patient that albuterol is in this medication so not to use in conjunction with rescue inhaler Emphasized the importance of patient avoiding triggers Emphasized the importance of patient increasing physical activity For a week follow-up with Dr. Vaughan Browner

## 2017-10-09 NOTE — Patient Instructions (Signed)
DuoNeb nebulizer >>> Can use every 6 hours as needed for shortness of breath and wheezing  Continue Breo Ellipta inhaler Continue use of rescue inhaler  Follow-up with Dr. Vaughan Browner in 4 weeks  Continue to work diligently on walking and exercising daily.  If you have any changes in your breathing please follow-up with our office.     Please contact the office if your symptoms worsen or you have concerns that you are not improving.   Thank you for choosing [facility] for your healthcare, and for allowing [place] to partner with you on your healthcare journey. I am thankful to be able to provide care to you today.   Wyn Quaker FNP-C

## 2017-10-11 DIAGNOSIS — J4530 Mild persistent asthma, uncomplicated: Secondary | ICD-10-CM | POA: Diagnosis not present

## 2017-10-11 DIAGNOSIS — I509 Heart failure, unspecified: Secondary | ICD-10-CM | POA: Diagnosis not present

## 2017-10-11 DIAGNOSIS — R0602 Shortness of breath: Secondary | ICD-10-CM | POA: Diagnosis not present

## 2017-10-17 DIAGNOSIS — M1712 Unilateral primary osteoarthritis, left knee: Secondary | ICD-10-CM | POA: Diagnosis not present

## 2017-10-17 DIAGNOSIS — M1711 Unilateral primary osteoarthritis, right knee: Secondary | ICD-10-CM | POA: Diagnosis not present

## 2017-10-17 DIAGNOSIS — M25561 Pain in right knee: Secondary | ICD-10-CM | POA: Diagnosis not present

## 2017-10-19 ENCOUNTER — Telehealth: Payer: Self-pay

## 2017-10-19 ENCOUNTER — Ambulatory Visit (INDEPENDENT_AMBULATORY_CARE_PROVIDER_SITE_OTHER): Payer: Medicare HMO

## 2017-10-19 DIAGNOSIS — Z95810 Presence of automatic (implantable) cardiac defibrillator: Secondary | ICD-10-CM

## 2017-10-19 DIAGNOSIS — I5022 Chronic systolic (congestive) heart failure: Secondary | ICD-10-CM | POA: Diagnosis not present

## 2017-10-19 NOTE — Progress Notes (Signed)
EPIC Encounter for ICM Monitoring  Patient Name: Paula Booker is a 72 y.o. female Date: 10/19/2017 Primary Care Physican: [name], MD Primary [place] Electrophysiologist: Druscilla Brownie Weight: Previous weight 196lbs  Clinical Status (14-Sep-2017 to 19-Oct-2017)  Time in AT/AF <0.1 hr/day (<0.1%)  Longest AT/AF 5 minutes  AT/AF 3 episodes  1 VT-NS   12 minutes in AT/AF Since Last Session               Attempted call to patient and unable to reach.  Left detailed message, per DPR, regarding transmission.  Transmission reviewed.    Thoracic impedance abnormal suggesting fluid accumulation starting 10/10/2017 and starting to trend toward baseline.  Prescribed dosage: Furosemide 40 mg 1 to 2 tablets (40 to 80 mg total) daily as directed for edema, SOB and/or weight gain.  Labs: 09/26/2017 Creatinine 0.95, BUN 26, Potassium 3.9, Sodium 138, EGFR 59->60 09/25/2017 Creatinine 0.92, BUN 28, Potassium 4.2, Sodium 136, EGFR >60  09/24/2017 Creatinine 1.06, BUN 27, Potassium 3.7, Sodium 132, EGFR 52-60  09/23/2017 Creatinine 0.85, BUN 18, Potassium 4.0, Sodium 136, EGFR >60  07/27/2017 Creatinine 0.92, BUN 16, Potassium 3.5, Sodium 137, EGFR >60  Recommendations: Left voice mail with ICM number and encouraged to call if experiencing any fluid symptoms.  Follow-up plan: ICM clinic phone appointment on 11/20/2017.    Copy of ICM check sent to Dr. Lovena Le.   3 month ICM trend: 10/19/2017    1 Year ICM trend:       [name], RN 10/19/2017 12:13 PM

## 2017-10-19 NOTE — Telephone Encounter (Signed)
Remote ICM transmission received.  Attempted call to patient and left detailed message, per DPR, regarding transmission and next ICM scheduled for 11/20/2017.  Advised to return call for any fluid symptoms or questions.

## 2017-11-01 LAB — CUP PACEART REMOTE DEVICE CHECK
Brady Statistic AS VP Percent: 0.04 %
Brady Statistic AS VS Percent: 92.64 %
Brady Statistic RA Percent Paced: 7.15 %
Brady Statistic RV Percent Paced: 0.06 %
Date Time Interrogation Session: 20190606084229
HIGH POWER IMPEDANCE MEASURED VALUE: 94 Ohm
Implantable Lead Implant Date: 20131212
Implantable Lead Location: 753859
Implantable Lead Location: 753860
Implantable Lead Model: 5076
Lead Channel Impedance Value: 779 Ohm
Lead Channel Impedance Value: 950 Ohm
Lead Channel Pacing Threshold Pulse Width: 0.4 ms
Lead Channel Sensing Intrinsic Amplitude: 3.375 mV
Lead Channel Sensing Intrinsic Amplitude: 9.125 mV
Lead Channel Sensing Intrinsic Amplitude: 9.125 mV
Lead Channel Setting Pacing Amplitude: 2 V
Lead Channel Setting Pacing Pulse Width: 0.4 ms
Lead Channel Setting Sensing Sensitivity: 0.3 mV
MDC IDC LEAD IMPLANT DT: 20131212
MDC IDC MSMT BATTERY REMAINING LONGEVITY: 58 mo
MDC IDC MSMT BATTERY VOLTAGE: 2.98 V
MDC IDC MSMT LEADCHNL RA IMPEDANCE VALUE: 475 Ohm
MDC IDC MSMT LEADCHNL RA PACING THRESHOLD AMPLITUDE: 0.5 V
MDC IDC MSMT LEADCHNL RA PACING THRESHOLD PULSEWIDTH: 0.4 ms
MDC IDC MSMT LEADCHNL RA SENSING INTR AMPL: 3.375 mV
MDC IDC MSMT LEADCHNL RV PACING THRESHOLD AMPLITUDE: 0.5 V
MDC IDC PG IMPLANT DT: 20131212
MDC IDC SET LEADCHNL RV PACING AMPLITUDE: 2.5 V
MDC IDC STAT BRADY AP VP PERCENT: 0.03 %
MDC IDC STAT BRADY AP VS PERCENT: 7.3 %

## 2017-11-11 DIAGNOSIS — I509 Heart failure, unspecified: Secondary | ICD-10-CM | POA: Diagnosis not present

## 2017-11-11 DIAGNOSIS — J4530 Mild persistent asthma, uncomplicated: Secondary | ICD-10-CM | POA: Diagnosis not present

## 2017-11-11 DIAGNOSIS — R0602 Shortness of breath: Secondary | ICD-10-CM | POA: Diagnosis not present

## 2017-11-13 ENCOUNTER — Encounter: Payer: Self-pay | Admitting: Pulmonary Disease

## 2017-11-13 ENCOUNTER — Ambulatory Visit (INDEPENDENT_AMBULATORY_CARE_PROVIDER_SITE_OTHER): Payer: Medicare HMO | Admitting: Pulmonary Disease

## 2017-11-13 VITALS — BP 104/64 | HR 73 | Ht 63.5 in | Wt 204.0 lb

## 2017-11-13 DIAGNOSIS — J4530 Mild persistent asthma, uncomplicated: Secondary | ICD-10-CM | POA: Diagnosis not present

## 2017-11-13 LAB — NITRIC OXIDE: NITRIC OXIDE: 15

## 2017-11-13 NOTE — Patient Instructions (Signed)
Continue the Breo inhaler and nebulizer Continue to work on weight loss with diet and exercise Follow-up in 4 months.

## 2017-11-13 NOTE — Progress Notes (Signed)
Shivon Hackel    [id number]    [birth year]  Primary Care Physician:[name], MD  Referring Physician: [name], [place], [place] [address]  Chief complaint:  Follow up for  Mild persistent asthma  HPI: 72 Y/O with history of nonischemic cardiomyopathy, syncope, WPW syndrome status post ICD implant, asthma.  She is complains of dyspnea since 2001 but reports worsening symptoms over the past 3 months. She has been evaluated in the pulmonary office in 2014 with normal PFTs. She was given [facility] inhaler for her asthma but does not appear to have improved her symptoms.  Her chief complaint is dyspnea with activity and at rest, cough with no sputum production, occasional wheezing, denies any fevers, chills, hemoptysis. As noted by Dr. Sheryn Bison, PCP she has been active at home remodeling her kitchen cabinets without any issue. She recently got a Fitbit fitness tracker and is currently doing 2500 steps per day. She has plans to increase it to 10,000 steps per day. She has also lost 6 pounds recently and plans on losing 6 more pounds. Current meds include albuterol inhaler which she uses rarely.  Pets: 3 cats. No dogs, birds, exotic animals Occupation: Retired. She used to work as a Engineer, manufacturing systems, Pharmacist, hospital, Optometrist Exposures: No mold issues, no exposure to asbestos. No hot tubs, Jacuzzi at home Smoking history: Never smoker  Interval history: Hospitalized in June for dyspnea, asthma exacerbation.  Had a follow-up with NP in clinic after Continues on Breo.  Still has dyspnea on exertion.  Started on nebulizers which she is using once a day  Outpatient Encounter Medications as of 11/13/2017  Medication Sig  . albuterol (PROVENTIL HFA;VENTOLIN HFA) 108 (90 Base) MCG/ACT inhaler Inhale 2 puffs into the lungs every 6 (six) hours as needed. For wheezing. Use 2 puffs 3 times daily x5 days and then every 6 hours as needed.  Marland Kitchen alendronate (FOSAMAX) 70 MG  tablet Take 70 mg by mouth once a week.   . carvedilol (COREG) 12.5 MG tablet TAKE 1 TABLET BY MOUTH TWICE DAILY WITH A MEAL (Patient taking differently: TAKE 1 TABLET ( 12.5 MG TOTALLY) MOUTH TWICE DAILY WITH A MEAL)  . cetirizine (ZYRTEC) 10 MG tablet Take 10 mg by mouth daily.  . Cholecalciferol (VITAMIN D3) 1000 UNITS CAPS Take 1,000 Units by mouth daily.   . citalopram (CELEXA) 20 MG tablet Take 20 mg by mouth daily.   . Cyanocobalamin (VITAMIN B-12) 5000 MCG LOZG Take 5,000 mcg by mouth daily.  . fluticasone (FLONASE) 50 MCG/ACT nasal spray Place 2 sprays into both nostrils daily.  . fluticasone furoate-vilanterol (BREO ELLIPTA) 200-25 MCG/INH AEPB Inhale 1 puff into the lungs daily.  . furosemide (LASIX) 40 MG tablet TAKE 1 TO 2 TABLETS BY MOUTH DAILY AS DIRECTED FOR EDEMA, SHORTNESS OF BREATH AND/OR WEIGHT GAIN (Patient taking differently: TAKE 2 TABLETS (80 MG TOTALLY) BY MOUTH EVERY MORNING)  . ipratropium-albuterol (DUONEB) 0.5-2.5 (3) MG/3ML SOLN Take 3 mLs by nebulization every 4 (four) hours as needed.  Marland Kitchen losartan (COZAAR) 25 MG tablet TAKE 1 TABLET BY MOUTH TWICE DAILY (Patient taking differently: TAKE 1 TABLET (25 MG TOTALLY) BY MOUTH TWICE DAILY)  . pantoprazole (PROTONIX) 40 MG tablet Take 40 mg by mouth daily.  Marland Kitchen spironolactone (ALDACTONE) 25 MG tablet TAKE 1 TABLET BY MOUTH ONCE DAILY (Patient taking differently: TAKE 1 TABLET (25 MG TOTALLY) BY MOUTH ONCE DAILY)  . [DISCONTINUED] ondansetron (ZOFRAN ODT) 4  MG disintegrating tablet Take 1 tablet (4 mg total) by mouth every 8 (eight) hours as needed for nausea or vomiting.  . [DISCONTINUED] predniSONE (DELTASONE) 20 MG tablet Take 1-3 tablets (20-60 mg total) by mouth daily with breakfast. Take 3 tablets (60mg )  x 3 days, then 2 tablets (40mg ) daily x3 days, then 1 tablet (20mg ) 3 days then stop.   No facility-administered encounter medications on file as of 11/13/2017.     Allergies as of 11/13/2017 - Review Complete 11/13/2017    Allergen Reaction Noted  . Ivp dye [iodinated diagnostic agents] Anaphylaxis, Hives, Swelling, and Other (See Comments) 03/07/2012  . Penicillins Anaphylaxis 09/12/2011  . Sulfonamide derivatives Other (See Comments) 09/12/2011  . Tessalon [benzonatate] Rash and Hives 04/03/2015  . Sulfa antibiotics Other (See Comments) 10/16/2014  . Hydrocodone-acetaminophen Other (See Comments) 01/01/2013  . Penicillin g Other (See Comments) 07/03/2012  . Rosuvastatin calcium Other (See Comments) 03/17/2011  . Onion Other (See Comments) 04/23/2012    Past Medical History:  Diagnosis Date  . Anxiety   . Arthritis    "hands and knees" (03/22/2012)  . Asthma   . CHF (congestive heart failure) ([place]) 6/13  . Depression   . GERD (gastroesophageal reflux disease)   . Head injury, acute, with loss of consciousness ([place])   . Hypertension   . ICD (implantable cardiac defibrillator) in place Dec 2013  . Migraines   . NICM (nonischemic cardiomyopathy) ([place])   . Obesity (BMI 30-39.9)    negative sleep study 2014  . Pacemaker   . Pneumonia    hx  . Shortness of breath    "@ any time I can get SOB" (03/22/2012)  . Skin cancer 1999  . WPW (Wolff-Parkinson-White syndrome)     Past Surgical History:  Procedure Laterality Date  . CARDIAC CATHETERIZATION  09/18/11   no significant CAD  . CARDIAC DEFIBRILLATOR PLACEMENT  03/22/2012   DDD  . CHOLECYSTECTOMY  ~ 2003  . IMPLANTABLE CARDIOVERTER DEFIBRILLATOR IMPLANT N/A 03/22/2012   Procedure: IMPLANTABLE CARDIOVERTER DEFIBRILLATOR IMPLANT;  Surgeon: Evans Lance, MD;  Location: [facility] CATH LAB;  Service: Cardiovascular;  Laterality: N/A;  . KYPHOPLASTY N/A 11/27/2013   Procedure: KYPHOPLASTY;  Surgeon: Sinclair Ship, MD;  Location: [place];  Service: Orthopedics;  Laterality: N/A;  T9, T11 kyphoplasty  . KYPHOPLASTY N/A 06/05/2014   Procedure: KYPHOPLASTY;  Surgeon: Sinclair Ship, MD;  Location: [place];  Service: Orthopedics;  Laterality: N/A;   T7 kyphoplasty  . MOHS SURGERY  06/2011   "forehead" (03/22/2012)  . SKIN CANCER EXCISION  1999   "top of my head and my right back shoulder"  . SKIN CANCER EXCISION     back  . SKIN CANCER EXCISION     face  . SUPRAVENTRICULAR TACHYCARDIA ABLATION  03/22/2012   unable to induce VT/RN report 03/22/2012  . SUPRAVENTRICULAR TACHYCARDIA ABLATION N/A 03/22/2012   Procedure: SUPRAVENTRICULAR TACHYCARDIA ABLATION;  Surgeon: Evans Lance, MD;  [facility] LAB;  Service: Cardiovascular;  Laterality: N/A;  . TONSILLECTOMY AND ADENOIDECTOMY  1950  . TUBAL LIGATION  1978    Family History  Problem Relation Age of Onset  . Congestive Heart Failure Brother   . Atrial fibrillation Brother   . Congestive Heart Failure Mother        died 35  . Kidney Stones Mother   . Deafness Mother   . Atrial fibrillation Mother   . Congestive Heart Failure Father   . Deafness Father   .  Healthy Sister   . Healthy Sister   . Healthy Sister   . Tuberculosis Paternal Grandfather     Social History   Socioeconomic History  . Marital status: Divorced    Spouse name: Not on file  . Number of children: 3  . Years of education: Not on file  . Highest education level: Not on file  Occupational History  . Occupation: retired    Comment: Mining engineer  . Financial resource strain: Not on file  . Food insecurity:    Worry: Not on file    Inability: Not on file  . Transportation needs:    Medical: Not on file    Non-medical: Not on file  Tobacco Use  . Smoking status: Never Smoker  . Smokeless tobacco: Never Used  Substance and Sexual Activity  . Alcohol use: No  . Drug use: No  . Sexual activity: Not on file  Lifestyle  . Physical activity:    Days per week: Not on file    Minutes per session: Not on file  . Stress: Not on file  Relationships  . Social connections:    Talks on phone: Not on file    Gets together: Not on file    Attends religious service: Not on file     Active member of club or organization: Not on file    Attends meetings of clubs or organizations: Not on file    Relationship status: Not on file  . Intimate partner violence:    Fear of current or ex partner: Not on file    Emotionally abused: Not on file    Physically abused: Not on file    Forced sexual activity: Not on file  Other Topics Concern  . Not on file  Social History Narrative   Divorced, 3 children, 9 grandchildren    Review of systems: Review of Systems  Constitutional: Negative for fever and chills.  HENT: Negative.   Eyes: Negative for blurred vision.  Respiratory: as per HPI  Cardiovascular: Negative for chest pain and palpitations.  Gastrointestinal: Negative for vomiting, diarrhea, blood per rectum. Genitourinary: Negative for dysuria, urgency, frequency and hematuria.  Musculoskeletal: Negative for myalgias, back pain and joint pain.  Skin: Negative for itching and rash.  Neurological: Negative for dizziness, tremors, focal weakness, seizures and loss of consciousness.  Endo/Heme/Allergies: Negative for environmental allergies.  Psychiatric/Behavioral: Negative for depression, suicidal ideas and hallucinations.  All other systems reviewed and are negative.  Physical Exam: Blood pressure 104/64, pulse 73, height 5' 3.5" (1.613 m), weight 204 lb (92.5 kg), SpO2 97 %. Gen:      No acute distress HEENT:  EOMI, sclera anicteric Neck:     No masses; no thyromegaly Lungs:    Clear to auscultation bilaterally; normal respiratory effort CV:         Regular rate and rhythm; no murmurs Abd:      + bowel sounds; soft, non-tender; no palpable masses, no distension Ext:    No edema; adequate peripheral perfusion Skin:      Warm and dry; no rash Neuro: alert and oriented x 3 Psych: normal mood and affect  Data Reviewed: PFTs FENO 09/20/16- 19 FENO 11/13/2017-15  Spirometry 05/10/12 FVC 2.15 [75%), FEV1 1.64 [72%), F/F 76  PFTs 10/17/12 FVC 2.24 [80%), FEV1 1.75  [70%), F/F 78, TLC 88%, DLCO 93% Normal study  PFTs 01/26/17 FVC 2.21 [77%], FEV1 1.65 [77%], F/F 75, TLC 86%, DLCO 86% Small airways disease with significant  bronchodilator response  Peak inspiratory flow 09/11/2017-60  Imaging CT chest 05/22/14-mild bilateral groundglass opacities consistent with edema CT abdomen, pelvis 03/18/15-left basilar consolidation Chest x-ray 07/27/2017- no acute cardiopulmonary abnormality. Chest x-ray 09/23/2017- no acute cardiopulmonary abnormality. I reviewed the images personally.  Labs Blood allergy profile 01/26/2017-IgE 13, RAST panel shows sensitivity to dust mite CBC 01/26/2017-WBC 8.5, eos 4.7%, absolute eosinophil count 400  Assessment:  Mild persistent asthma Even though FENO is low she has elevated peripheral eos and the PFTs show significant reduction in mid flow rates with bronchodilator response.  Continues on Breo, albuterol as needed with inhaler and nebulizer.  Suspected dyspnea is contributed by obesity and deconditioning.  Encouraged weight loss and exercise. Declined to do pulmonary rehab due to costs involved.   GERD On Prilosec.  Scheduled to get EGD and colonoscopy at [facility] in the next month.  Health maintenance 01/13/2017-influenza 03/19/1959-Pneumovax  Plan/Recommendations:  - Continue breo, albuterol PRN. - Continue weight loss and exercise.  Marshell Garfinkel MD [facility] 11/13/2017, 10:43 AM  CC: [name], MD

## 2017-11-16 DIAGNOSIS — S83282A Other tear of lateral meniscus, current injury, left knee, initial encounter: Secondary | ICD-10-CM | POA: Diagnosis not present

## 2017-11-16 DIAGNOSIS — M25562 Pain in left knee: Secondary | ICD-10-CM | POA: Diagnosis not present

## 2017-11-20 ENCOUNTER — Ambulatory Visit (INDEPENDENT_AMBULATORY_CARE_PROVIDER_SITE_OTHER): Payer: Medicare HMO

## 2017-11-20 ENCOUNTER — Telehealth: Payer: Self-pay

## 2017-11-20 DIAGNOSIS — I5022 Chronic systolic (congestive) heart failure: Secondary | ICD-10-CM

## 2017-11-20 DIAGNOSIS — Z95810 Presence of automatic (implantable) cardiac defibrillator: Secondary | ICD-10-CM

## 2017-11-20 NOTE — Progress Notes (Signed)
EPIC Encounter for ICM Monitoring  Patient Name: Paula Booker is a 72 y.o. female Date: 11/20/2017 Primary Care Physican: [name], MD Primary [place] Electrophysiologist: Druscilla Brownie Weight: 200lbs      Heart Failure questions reviewed, pt asymptomatic.  She will have knee arthroscopy on 11/29/2017.     Thoracic impedance normal.  Prescribed dosage: Furosemide 40 mg 1 to 2 tablets (40 to 80 mg total) daily as directed for edema, SOB and/or weight gain.  Labs: 09/26/2017 Creatinine 0.95, BUN 26, Potassium 3.9, Sodium 138, EGFR 59->60 09/25/2017 Creatinine 0.92, BUN 28, Potassium 4.2, Sodium 136, EGFR >60  09/24/2017 Creatinine 1.06, BUN 27, Potassium 3.7, Sodium 132, EGFR 52-60  09/23/2017 Creatinine 0.85, BUN 18, Potassium 4.0, Sodium 136, EGFR >60  07/27/2017 Creatinine 0.92, BUN 16, Potassium 3.5, Sodium 137, EGFR >60  Recommendations: No changes.    Encouraged to call for fluid symptoms.  Follow-up plan: ICM clinic phone appointment on 12/21/2017.       Copy of ICM check sent to Dr. Lovena Le.   3 month ICM trend: 11/20/2017    1 Year ICM trend:       [name], RN 11/20/2017 11:40 AM

## 2017-11-20 NOTE — Telephone Encounter (Signed)
Remote ICM transmission received.  Attempted call to patient and left message to return call. 

## 2017-11-23 ENCOUNTER — Other Ambulatory Visit: Payer: Self-pay | Admitting: Orthopedic Surgery

## 2017-11-24 ENCOUNTER — Other Ambulatory Visit: Payer: Self-pay | Admitting: Orthopedic Surgery

## 2017-11-24 NOTE — Patient Instructions (Signed)
Paula Booker  11/24/2017   Your procedure is scheduled on: 11/29/2017   Report to [facility] Main  Entrance  Report to admitting at    1015 AM    Call this number if you have problems the morning of surgery [phone]   Remember: Do not eat food or drink liquids :After Midnight.     Take these medicines the morning of surgery with A SIP OF WATER: Inhalers as usual and bring, Nebulizer if needed, Carvedilol, zyrtec, Celexa, Flonase, Protonix                                 You may not have any metal on your body including hair pins and              piercings  Do not wear jewelry, make-up, lotions, powders or perfumes, deodorant             Do not wear nail polish.  Do not shave  48 hours prior to surgery.                Do not bring valuables to the hospital. [place].  Contacts, dentures or bridgework may not be worn into surgery.      Patients discharged the day of surgery will not be allowed to drive home.  Name and phone number of your driver:                 Please read over the following fact sheets you were given: _____________________________________________________________________             [facility] - Preparing for Surgery Before surgery, you can play an important role.  Because skin is not sterile, your skin needs to be as free of germs as possible.  You can reduce the number of germs on your skin by washing with CHG (chlorahexidine gluconate) soap before surgery.  CHG is an antiseptic cleaner which kills germs and bonds with the skin to continue killing germs even after washing. Please DO NOT use if you have an allergy to CHG or antibacterial soaps.  If your skin becomes reddened/irritated stop using the CHG and inform your nurse when you arrive at Short Stay. Do not shave (including legs and underarms) for at least 48 hours prior to the first CHG shower.  You may shave your  face/neck. Please follow these instructions carefully:  1.  Shower with CHG Soap the night before surgery and the  morning of Surgery.  2.  If you choose to wash your hair, wash your hair first as usual with your  normal  shampoo.  3.  After you shampoo, rinse your hair and body thoroughly to remove the  shampoo.                           4.  Use CHG as you would any other liquid soap.  You can apply chg directly  to the skin and wash                       Gently with a scrungie or clean washcloth.  5.  Apply the CHG Soap to your body ONLY FROM  THE NECK DOWN.   Do not use on face/ open                           Wound or open sores. Avoid contact with eyes, ears mouth and genitals (private parts).                       Wash face,  Genitals (private parts) with your normal soap.             6.  Wash thoroughly, paying special attention to the area where your surgery  will be performed.  7.  Thoroughly rinse your body with warm water from the neck down.  8.  DO NOT shower/wash with your normal soap after using and rinsing off  the CHG Soap.                9.  Pat yourself dry with a clean towel.            10.  Wear clean pajamas.            11.  Place clean sheets on your bed the night of your first shower and do not  sleep with pets. Day of Surgery : Do not apply any lotions/deodorants the morning of surgery.  Please wear clean clothes to the hospital/surgery center.  FAILURE TO FOLLOW THESE INSTRUCTIONS MAY RESULT IN THE CANCELLATION OF YOUR SURGERY PATIENT SIGNATURE_________________________________  NURSE SIGNATURE__________________________________  ________________________________________________________________________

## 2017-11-24 NOTE — Progress Notes (Signed)
Requested orders and cardiac clearance from [facility] at Dr Berenice Primas.

## 2017-11-27 ENCOUNTER — Telehealth: Payer: Self-pay | Admitting: *Deleted

## 2017-11-27 NOTE — Telephone Encounter (Signed)
   [facility] Pre-operative Risk Assessment    Request for surgical clearance:  1. What type of surgery is being performed? LEFT KNEE SCOPE   2. When is this surgery scheduled? 11/29/17   3. What type of clearance is required (medical clearance vs. Pharmacy clearance to hold med vs. Both)? BOTH  4. Are there any medications that need to be held prior to surgery and how long? NONE SPECIFIED   5. Practice name and name of physician performing surgery?  [facility] Lowella Petties, MD   6. What is your office phone number [phone]    7.   What is your office fax number [phone]  8.   Anesthesia type (None, local, MAC, general) ?  CHOICE   Nuala Alpha 11/27/2017, 11:32 AM  _________________________________________________________________   (provider comments below)

## 2017-11-27 NOTE — Telephone Encounter (Signed)
Called [facility], spoke with Katharine Look, to let them know that the pt would need to speak with [place] before she can be cleared for surgery and that we have left a couple of messages for pt to call back. Katharine Look will let the surgery schedulers know.

## 2017-11-27 NOTE — Telephone Encounter (Signed)
   Primary Cardiologist: Larae Grooms, MD  Chart reviewed as part of pre-operative protocol coverage. Patient was contacted 11/27/2017 in reference to pre-operative risk assessment for pending surgery as outlined below.  Paula Booker was last seen on 04/2017 by Dr. Lovena Le. H/o NICM, chronic systolic CHF, syncope, WPW, NSVT, SVT, anxiety, arthritis, depression, GERD, migraines, obesity.  She has had ICD implant and EP study for WPW with no inducible arrhythmia. Last EF 20-25% in 2016. Cath 2013 with no significant CAD. Last thoracic impedence 8/12 was normal. RCRI 0.9% indicating low risk of cardiac complications.  Patient will require a phone call discussion prior to clearing. I tried to call her twice but went to voicemail both times. No other phone number listed. LMOM to call us back. Callback staff, please let ortho know we need to speak with pt before clearance can be provided (received 8/19 for 8/21 surgery).  Charlie Pitter, PA-C 11/27/2017, 2:42 PM

## 2017-11-28 ENCOUNTER — Other Ambulatory Visit: Payer: Self-pay

## 2017-11-28 ENCOUNTER — Encounter (HOSPITAL_COMMUNITY): Payer: Self-pay

## 2017-11-28 ENCOUNTER — Encounter (HOSPITAL_COMMUNITY)
Admission: RE | Admit: 2017-11-28 | Discharge: 2017-11-28 | Disposition: A | Discharge status: Home / Self Care | Payer: Medicare HMO | Source: Ambulatory Visit | Attending: Orthopedic Surgery | Admitting: Orthopedic Surgery

## 2017-11-28 ENCOUNTER — Telehealth: Payer: Self-pay | Admitting: Interventional Cardiology

## 2017-11-28 DIAGNOSIS — X58XXXA Exposure to other specified factors, initial encounter: Secondary | ICD-10-CM | POA: Diagnosis not present

## 2017-11-28 DIAGNOSIS — K219 Gastro-esophageal reflux disease without esophagitis: Secondary | ICD-10-CM | POA: Diagnosis not present

## 2017-11-28 DIAGNOSIS — I110 Hypertensive heart disease with heart failure: Secondary | ICD-10-CM | POA: Diagnosis not present

## 2017-11-28 DIAGNOSIS — F329 Major depressive disorder, single episode, unspecified: Secondary | ICD-10-CM | POA: Diagnosis not present

## 2017-11-28 DIAGNOSIS — S83242A Other tear of medial meniscus, current injury, left knee, initial encounter: Secondary | ICD-10-CM | POA: Diagnosis not present

## 2017-11-28 DIAGNOSIS — J45909 Unspecified asthma, uncomplicated: Secondary | ICD-10-CM | POA: Diagnosis not present

## 2017-11-28 DIAGNOSIS — M6752 Plica syndrome, left knee: Secondary | ICD-10-CM | POA: Diagnosis not present

## 2017-11-28 DIAGNOSIS — Z85828 Personal history of other malignant neoplasm of skin: Secondary | ICD-10-CM | POA: Diagnosis not present

## 2017-11-28 DIAGNOSIS — Z7983 Long term (current) use of bisphosphonates: Secondary | ICD-10-CM | POA: Diagnosis not present

## 2017-11-28 DIAGNOSIS — M2242 Chondromalacia patellae, left knee: Secondary | ICD-10-CM | POA: Diagnosis not present

## 2017-11-28 DIAGNOSIS — M25562 Pain in left knee: Secondary | ICD-10-CM | POA: Diagnosis present

## 2017-11-28 DIAGNOSIS — Z7951 Long term (current) use of inhaled steroids: Secondary | ICD-10-CM | POA: Diagnosis not present

## 2017-11-28 DIAGNOSIS — Z79899 Other long term (current) drug therapy: Secondary | ICD-10-CM | POA: Diagnosis not present

## 2017-11-28 DIAGNOSIS — I509 Heart failure, unspecified: Secondary | ICD-10-CM | POA: Diagnosis not present

## 2017-11-28 HISTORY — DX: Anemia, unspecified: D64.9

## 2017-11-28 LAB — CBC
HEMATOCRIT: 40.2 % (ref 36.0–46.0)
HEMOGLOBIN: 13.2 g/dL (ref 12.0–15.0)
MCH: 30.6 pg (ref 26.0–34.0)
MCHC: 32.8 g/dL (ref 30.0–36.0)
MCV: 93.3 fL (ref 78.0–100.0)
Platelets: 256 10*3/uL (ref 150–400)
RBC: 4.31 MIL/uL (ref 3.87–5.11)
RDW: 12.8 % (ref 11.5–15.5)
WBC: 8.5 10*3/uL (ref 4.0–10.5)

## 2017-11-28 LAB — BASIC METABOLIC PANEL
ANION GAP: 6 (ref 5–15)
BUN: 15 mg/dL (ref 8–23)
CHLORIDE: 102 mmol/L (ref 98–111)
CO2: 32 mmol/L (ref 22–32)
Calcium: 9.4 mg/dL (ref 8.9–10.3)
Creatinine, Ser: 0.97 mg/dL (ref 0.44–1.00)
GFR calc Af Amer: >60 mL/min (ref >60)
GFR calc non Af Amer: 57 mL/min — ABNORMAL LOW (ref >60)
GLUCOSE: 171 mg/dL — AB (ref 70–99)
Potassium: 3.7 mmol/L (ref 3.5–5.1)
Sodium: 140 mmol/L (ref 135–145)

## 2017-11-28 NOTE — Telephone Encounter (Signed)
New message:   Patient is return call

## 2017-11-28 NOTE — Telephone Encounter (Signed)
Follow Up    Pt returning call from pre op to speak with nurse prior to surgery which is tomorrow, states she will be leaving out at 5

## 2017-11-28 NOTE — Progress Notes (Signed)
LVMM with Elmyra Ricks ( Juliann Pulse out of town on vacation ) looking for cardiac clearance on patient.  Patient has placed  call under encounters in epic .  Per encounters it states cardiology needs to speak with patient.  LVMM with Elmyra Ricks at office of Dr Dorna Leitz stating above.

## 2017-11-28 NOTE — Telephone Encounter (Signed)
   McCook, MD   Per Dr. Tamala Julian (DOD)  Chart reviewed as part of pre-operative protocol coverage. Because of Dannika Dross's past medical history and time since last visit, he/she will require a follow-up visit in order to better assess preoperative cardiovascular risk.  Pre-op covering staff: - Please schedule appointment and call patient to inform them. - Please contact requesting surgeon's office via preferred method (i.e, phone, fax) to inform them of need for appointment prior to surgery.  Truitt Merle, NP  11/28/2017, 4:23 PM

## 2017-11-28 NOTE — Progress Notes (Addendum)
ekg-09/24/2017-Epic cxr-09/23/17-Epic  dEVICE ORDERS ON CHART.  11/13/17-lov-PULM - Epic  11/25/2017- REMOTE TRANSMISSION IN Epic  LOV-05/04/2017 - Dr Lovena Le in epic .

## 2017-11-28 NOTE — Telephone Encounter (Signed)
lvm for pt to call back pre-op due to having to get a surgical clearance appt per DOD Dr. Tamala Julian.  Pt's surgery is scheduled tomorrow.

## 2017-11-28 NOTE — Progress Notes (Signed)
iNSTRUCTED PATIENT TO CALL CARDIOLOGY OFFICE REGARDING CLEARANCE.  THEY ARE WAITING TO HEAR FROM HER PRIOR TO HER BEING CLEARED FOR SURGERY. PATIENT STATED SHE WOULD CALL AFTER PREOP APPT.

## 2017-11-29 ENCOUNTER — Encounter (HOSPITAL_COMMUNITY): Payer: Self-pay | Admitting: *Deleted

## 2017-11-29 ENCOUNTER — Encounter (HOSPITAL_COMMUNITY)
Admission: RE | Disposition: A | Discharge status: Home / Self Care | Payer: Self-pay | Source: Ambulatory Visit | Attending: Orthopedic Surgery

## 2017-11-29 ENCOUNTER — Ambulatory Visit (HOSPITAL_COMMUNITY)
Admission: RE | Admit: 2017-11-29 | Discharge: 2017-11-29 | Disposition: A | Discharge status: Home / Self Care | Payer: Medicare HMO | Source: Ambulatory Visit | Attending: Orthopedic Surgery | Admitting: Orthopedic Surgery

## 2017-11-29 ENCOUNTER — Ambulatory Visit (HOSPITAL_COMMUNITY): Payer: Medicare HMO | Admitting: Anesthesiology

## 2017-11-29 DIAGNOSIS — S83242A Other tear of medial meniscus, current injury, left knee, initial encounter: Secondary | ICD-10-CM | POA: Insufficient documentation

## 2017-11-29 DIAGNOSIS — G8918 Other acute postprocedural pain: Secondary | ICD-10-CM | POA: Diagnosis not present

## 2017-11-29 DIAGNOSIS — M6752 Plica syndrome, left knee: Secondary | ICD-10-CM | POA: Insufficient documentation

## 2017-11-29 DIAGNOSIS — I110 Hypertensive heart disease with heart failure: Secondary | ICD-10-CM | POA: Diagnosis not present

## 2017-11-29 DIAGNOSIS — K219 Gastro-esophageal reflux disease without esophagitis: Secondary | ICD-10-CM | POA: Insufficient documentation

## 2017-11-29 DIAGNOSIS — Z7983 Long term (current) use of bisphosphonates: Secondary | ICD-10-CM | POA: Insufficient documentation

## 2017-11-29 DIAGNOSIS — M94262 Chondromalacia, left knee: Secondary | ICD-10-CM | POA: Diagnosis not present

## 2017-11-29 DIAGNOSIS — J45909 Unspecified asthma, uncomplicated: Secondary | ICD-10-CM | POA: Diagnosis not present

## 2017-11-29 DIAGNOSIS — Z79899 Other long term (current) drug therapy: Secondary | ICD-10-CM | POA: Insufficient documentation

## 2017-11-29 DIAGNOSIS — I5023 Acute on chronic systolic (congestive) heart failure: Secondary | ICD-10-CM | POA: Diagnosis not present

## 2017-11-29 DIAGNOSIS — Z7951 Long term (current) use of inhaled steroids: Secondary | ICD-10-CM | POA: Insufficient documentation

## 2017-11-29 DIAGNOSIS — I509 Heart failure, unspecified: Secondary | ICD-10-CM | POA: Diagnosis not present

## 2017-11-29 DIAGNOSIS — M2242 Chondromalacia patellae, left knee: Secondary | ICD-10-CM | POA: Insufficient documentation

## 2017-11-29 DIAGNOSIS — Z85828 Personal history of other malignant neoplasm of skin: Secondary | ICD-10-CM | POA: Diagnosis not present

## 2017-11-29 DIAGNOSIS — F329 Major depressive disorder, single episode, unspecified: Secondary | ICD-10-CM | POA: Insufficient documentation

## 2017-11-29 DIAGNOSIS — X58XXXA Exposure to other specified factors, initial encounter: Secondary | ICD-10-CM | POA: Insufficient documentation

## 2017-11-29 DIAGNOSIS — S83282A Other tear of lateral meniscus, current injury, left knee, initial encounter: Secondary | ICD-10-CM | POA: Diagnosis not present

## 2017-11-29 HISTORY — PX: KNEE ARTHROSCOPY: SHX127

## 2017-11-29 SURGERY — ARTHROSCOPY, KNEE
Anesthesia: General | Site: Knee | Laterality: Left

## 2017-11-29 MED ORDER — PHENYLEPHRINE HCL 10 MG/ML IJ SOLN
INTRAMUSCULAR | Status: AC
  Filled 2017-11-29: qty 1

## 2017-11-29 MED ORDER — HYDROCODONE-ACETAMINOPHEN 5-325 MG PO TABS: 1.0000 | ORAL_TABLET | Freq: Four times a day (QID) | ORAL | 0 refills | Status: DC | PRN

## 2017-11-29 MED ORDER — MEPERIDINE HCL 50 MG/ML IJ SOLN: 6.2500 mg | INTRAMUSCULAR | Status: DC | PRN

## 2017-11-29 MED ORDER — FENTANYL CITRATE (PF) 100 MCG/2ML IJ SOLN
INTRAMUSCULAR | Status: AC
  Filled 2017-11-29: qty 2

## 2017-11-29 MED ORDER — FENTANYL CITRATE (PF) 100 MCG/2ML IJ SOLN
50.0000 ug | INTRAMUSCULAR | Status: DC
  Administered 2017-11-29: 100 ug via INTRAVENOUS
  Filled 2017-11-29: qty 2

## 2017-11-29 MED ORDER — EPINEPHRINE PF 1 MG/ML IJ SOLN
INTRAMUSCULAR | Status: AC
  Filled 2017-11-29: qty 1

## 2017-11-29 MED ORDER — PROPOFOL 10 MG/ML IV BOLUS
INTRAVENOUS | Status: DC | PRN
  Administered 2017-11-29: 100 mg via INTRAVENOUS

## 2017-11-29 MED ORDER — CLINDAMYCIN PHOSPHATE 900 MG/50ML IV SOLN
INTRAVENOUS | Status: AC
  Filled 2017-11-29: qty 50

## 2017-11-29 MED ORDER — LIDOCAINE HCL 1 % IJ SOLN
INTRAMUSCULAR | Status: DC | PRN
  Administered 2017-11-29: 80 mg via INTRADERMAL

## 2017-11-29 MED ORDER — CLINDAMYCIN PHOSPHATE 900 MG/50ML IV SOLN: 900.0000 mg | INTRAVENOUS | Status: DC

## 2017-11-29 MED ORDER — SODIUM CHLORIDE 0.9 % IR SOLN
Status: DC | PRN
  Administered 2017-11-29: 1500 mL

## 2017-11-29 MED ORDER — LIDOCAINE 2% (20 MG/ML) 5 ML SYRINGE
INTRAMUSCULAR | Status: AC
  Filled 2017-11-29: qty 5

## 2017-11-29 MED ORDER — BUPIVACAINE HCL (PF) 0.5 % IJ SOLN
INTRAMUSCULAR | Status: AC
  Filled 2017-11-29: qty 30

## 2017-11-29 MED ORDER — PROPOFOL 10 MG/ML IV BOLUS
INTRAVENOUS | Status: AC
  Filled 2017-11-29: qty 20

## 2017-11-29 MED ORDER — PHENYLEPHRINE 40 MCG/ML (10ML) SYRINGE FOR IV PUSH (FOR BLOOD PRESSURE SUPPORT)
PREFILLED_SYRINGE | INTRAVENOUS | Status: AC
  Filled 2017-11-29: qty 10

## 2017-11-29 MED ORDER — BUPIVACAINE HCL (PF) 0.25 % IJ SOLN
INTRAMUSCULAR | Status: DC | PRN
  Administered 2017-11-29: 20 mL

## 2017-11-29 MED ORDER — LACTATED RINGERS IV SOLN
INTRAVENOUS | Status: DC
  Administered 2017-11-29: 11:00:00 via INTRAVENOUS

## 2017-11-29 MED ORDER — ONDANSETRON HCL 4 MG/2ML IJ SOLN
INTRAMUSCULAR | Status: DC | PRN
  Administered 2017-11-29: 4 mg via INTRAVENOUS

## 2017-11-29 MED ORDER — ONDANSETRON HCL 4 MG/2ML IJ SOLN
INTRAMUSCULAR | Status: AC
  Filled 2017-11-29: qty 2

## 2017-11-29 MED ORDER — CHLORHEXIDINE GLUCONATE 4 % EX LIQD: 60.0000 mL | Freq: Once | CUTANEOUS | Status: DC

## 2017-11-29 MED ORDER — MIDAZOLAM HCL 2 MG/2ML IJ SOLN: 1.0000 mg | INTRAMUSCULAR | Status: DC

## 2017-11-29 MED ORDER — FENTANYL CITRATE (PF) 100 MCG/2ML IJ SOLN: 25.0000 ug | INTRAMUSCULAR | Status: DC | PRN

## 2017-11-29 MED ORDER — BUPIVACAINE HCL (PF) 0.25 % IJ SOLN
INTRAMUSCULAR | Status: AC
  Filled 2017-11-29: qty 30

## 2017-11-29 MED ORDER — SODIUM CHLORIDE 0.9 % IV SOLN
INTRAVENOUS | Status: DC | PRN
  Administered 2017-11-29: 50 ug/min via INTRAVENOUS

## 2017-11-29 MED ORDER — CLINDAMYCIN PHOSPHATE 900 MG/50ML IV SOLN
900.0000 mg | Freq: Once | INTRAVENOUS | Status: AC
  Administered 2017-11-29: 900 mg via INTRAVENOUS

## 2017-11-29 MED ORDER — LACTATED RINGERS IV SOLN: INTRAVENOUS | Status: DC

## 2017-11-29 MED ORDER — PHENYLEPHRINE 40 MCG/ML (10ML) SYRINGE FOR IV PUSH (FOR BLOOD PRESSURE SUPPORT)
PREFILLED_SYRINGE | INTRAVENOUS | Status: DC | PRN
  Administered 2017-11-29 (×3): 120 ug via INTRAVENOUS

## 2017-11-29 SURGICAL SUPPLY — 20 items
BANDAGE ACE 4X5 VEL STRL LF (GAUZE/BANDAGES/DRESSINGS) ×1 IMPLANT
BANDAGE ACE 6X5 VEL STRL LF (GAUZE/BANDAGES/DRESSINGS) ×2 IMPLANT
BLADE 4.2CUDA (BLADE) ×2 IMPLANT
BNDG GAUZE ELAST 4 BULKY (GAUZE/BANDAGES/DRESSINGS) ×2 IMPLANT
BOOTIES KNEE HIGH SLOAN (MISCELLANEOUS) ×2 IMPLANT
COVER SURGICAL LIGHT HANDLE (MISCELLANEOUS) ×2 IMPLANT
DRAPE SHEET LG 3/4 BI-LAMINATE (DRAPES) ×2 IMPLANT
DRSG EMULSION OIL 3X3 NADH (GAUZE/BANDAGES/DRESSINGS) ×2 IMPLANT
DRSG PAD ABDOMINAL 8X10 ST (GAUZE/BANDAGES/DRESSINGS) ×2 IMPLANT
DURAPREP 26ML APPLICATOR (WOUND CARE) ×2 IMPLANT
GAUZE SPONGE 4X4 12PLY STRL (GAUZE/BANDAGES/DRESSINGS) ×2 IMPLANT
GLOVE ECLIPSE 7.5 STRL STRAW (GLOVE) ×4 IMPLANT
MANIFOLD NEPTUNE II (INSTRUMENTS) ×4 IMPLANT
PACK ARTHROSCOPY WL (CUSTOM PROCEDURE TRAY) ×2 IMPLANT
PACK ICE MAXI GEL EZY WRAP (MISCELLANEOUS) ×6 IMPLANT
PADDING CAST COTTON 6X4 STRL (CAST SUPPLIES) ×2 IMPLANT
POSITIONER SURGICAL ARM (MISCELLANEOUS) ×3 IMPLANT
SUT ETHILON 4 0 PS 2 18 (SUTURE) ×2 IMPLANT
TUBING ARTHRO INFLOW-ONLY STRL (TUBING) ×2 IMPLANT
WRAP KNEE MAXI GEL POST OP (GAUZE/BANDAGES/DRESSINGS) ×2 IMPLANT

## 2017-11-29 NOTE — Anesthesia Procedure Notes (Signed)
Procedure Name: LMA Insertion Date/Time: 11/29/2017 12:51 PM Performed by: [place] Tajae Maiolo C, CRNA Pre-anesthesia Checklist: Patient identified, Emergency Drugs available, Suction available and Patient being monitored Patient Re-evaluated:Patient Re-evaluated prior to induction Oxygen Delivery Method: Circle system utilized Preoxygenation: Pre-oxygenation with 100% oxygen Induction Type: IV induction Ventilation: Mask ventilation without difficulty LMA: LMA inserted LMA Size: 4.0 Number of attempts: 1 Airway Equipment and Method: Bite block Placement Confirmation: positive ETCO2 Tube secured with: Tape Dental Injury: Teeth and Oropharynx as per pre-operative assessment

## 2017-11-29 NOTE — Anesthesia Preprocedure Evaluation (Addendum)
Anesthesia Evaluation  Patient identified by MRN, date of birth, ID band Patient awake    Reviewed: Allergy & Precautions, NPO status , Patient's Chart, lab work & pertinent test results  History of Anesthesia Complications Negative for: history of anesthetic complications  Airway Mallampati: III  TM Distance: >3 FB Neck ROM: Full    Dental  (+) Partial Lower,    Pulmonary shortness of breath and with exertion, asthma , neg sleep apnea, neg recent URI,    breath sounds clear to auscultation       Cardiovascular hypertension, Pt. on medications and Pt. on home beta blockers (-) angina+CHF  (-) CAD and (-) Past MI + pacemaker + Cardiac Defibrillator  Rhythm:Regular     Neuro/Psych  Headaches, PSYCHIATRIC DISORDERS Anxiety Depression t7 compression fracutre    GI/Hepatic Neg liver ROS, GERD  Medicated and Controlled,  Endo/Other  negative endocrine ROS  Renal/GU negative Renal ROS     Musculoskeletal  (+) Arthritis ,   Abdominal   Peds  Hematology negative hematology ROS (+)   Anesthesia Other Findings   Reproductive/Obstetrics                             Anesthesia Physical  Anesthesia Plan  ASA: III  Anesthesia Plan: General   Post-op Pain Management:    Induction: Intravenous  PONV Risk Score and Plan:   Airway Management Planned: Oral ETT and LMA  Additional Equipment: None  Intra-op Plan:   Post-operative Plan: Extubation in OR  Informed Consent: I have reviewed the patients History and Physical, chart, labs and discussed the procedure including the risks, benefits and alternatives for the proposed anesthesia with the patient or authorized representative who has indicated his/her understanding and acceptance.   Dental advisory given  Plan Discussed with: CRNA, Surgeon and Anesthesiologist  Anesthesia Plan Comments:         Anesthesia Quick Evaluation

## 2017-11-29 NOTE — Transfer of Care (Signed)
Immediate Anesthesia Transfer of Care Note  Patient: Paula Booker  Procedure(s) Performed: ARTHROSCOPY LEFT KNEE (Left Knee)  Patient Location: PACU  Anesthesia Type:GA combined with regional for post-op pain  Level of Consciousness: awake, alert  and oriented  Airway & Oxygen Therapy: Patient Spontanous Breathing and Patient connected to face mask oxygen  Post-op Assessment: Report given to RN and Post -op Vital signs reviewed and stable  Post vital signs: Reviewed and stable  Last Vitals:  Vitals Value Taken Time  BP    Temp    Pulse    Resp    SpO2      Last Pain:  Vitals:   11/29/17 1104  TempSrc:   PainSc: 2          Complications: No apparent anesthesia complications

## 2017-11-29 NOTE — Progress Notes (Signed)
AssistedDr. Ambrose Pancoast with left intrarticular block. Side rails up, monitors on throughout procedure. See vital signs in flow sheet. Tolerated Procedure well.

## 2017-11-29 NOTE — H&P (Signed)
PREOPERATIVE H&P  Chief Complaint: left knee pain  HPI: Paula Booker is a 72 y.o. female who presents for evaluation of left knee pain. It has been present for greater than 3 months and has been worsening. She has failed conservative measures. Pain is rated as moderate.  Past Medical History:  Diagnosis Date  . Anemia    as a child  . Anxiety   . Arthritis    "hands and knees" (03/22/2012)  . Asthma   . CHF (congestive heart failure) ([place]) 6/13  . Depression   . GERD (gastroesophageal reflux disease)   . Head injury, acute, with loss of consciousness ([place])   . Hypertension   . ICD (implantable cardiac defibrillator) in place Dec 2013  . Migraines    migraines   . NICM (nonischemic cardiomyopathy) ([place])   . Obesity (BMI 30-39.9)    negative sleep study 2014  . Pacemaker   . Pneumonia    hx  . Shortness of breath    "@ any time I can get SOB" (03/22/2012)  . Skin cancer 1999  . WPW (Wolff-Parkinson-White syndrome)    Past Surgical History:  Procedure Laterality Date  . CARDIAC CATHETERIZATION  09/18/11   no significant CAD  . CARDIAC DEFIBRILLATOR PLACEMENT  03/22/2012   DDD  . CHOLECYSTECTOMY  ~ 2003  . IMPLANTABLE CARDIOVERTER DEFIBRILLATOR IMPLANT N/A 03/22/2012   Procedure: IMPLANTABLE CARDIOVERTER DEFIBRILLATOR IMPLANT;  Surgeon: Evans Lance, MD;  Location: [facility] CATH LAB;  Service: Cardiovascular;  Laterality: N/A;  . KYPHOPLASTY N/A 11/27/2013   Procedure: KYPHOPLASTY;  Surgeon: Sinclair Ship, MD;  Location: [place];  Service: Orthopedics;  Laterality: N/A;  T9, T11 kyphoplasty  . KYPHOPLASTY N/A 06/05/2014   Procedure: KYPHOPLASTY;  Surgeon: Sinclair Ship, MD;  Location: [place];  Service: Orthopedics;  Laterality: N/A;  T7 kyphoplasty  . MOHS SURGERY  06/2011   "forehead" (03/22/2012)  . SKIN CANCER EXCISION  1999   "top of my head and my right back shoulder"  . SKIN CANCER EXCISION     back  . SKIN CANCER EXCISION     face  . SUPRAVENTRICULAR  TACHYCARDIA ABLATION  03/22/2012   unable to induce VT/RN report 03/22/2012  . SUPRAVENTRICULAR TACHYCARDIA ABLATION N/A 03/22/2012   Procedure: SUPRAVENTRICULAR TACHYCARDIA ABLATION;  Surgeon: Evans Lance, MD;  Location: [facility] CATH LAB;  Service: Cardiovascular;  Laterality: N/A;  . TONSILLECTOMY AND ADENOIDECTOMY  1950  . TUBAL LIGATION  1978   Social History   Socioeconomic History  . Marital status: Divorced    Spouse name: Not on file  . Number of children: 3  . Years of education: Not on file  . Highest education level: Not on file  Occupational History  . Occupation: retired    Comment: Mining engineer  . Financial resource strain: Not on file  . Food insecurity:    Worry: Not on file    Inability: Not on file  . Transportation needs:    Medical: Not on file    Non-medical: Not on file  Tobacco Use  . Smoking status: Never Smoker  . Smokeless tobacco: Never Used  Substance and Sexual Activity  . Alcohol use: No  . Drug use: No  . Sexual activity: Not on file  Lifestyle  . Physical activity:    Days per week: Not on file    Minutes per session: Not on file  . Stress: Not on file  Relationships  . Social connections:  Talks on phone: Not on file    Gets together: Not on file    Attends religious service: Not on file    Active member of club or organization: Not on file    Attends meetings of clubs or organizations: Not on file    Relationship status: Not on file  Other Topics Concern  . Not on file  Social History Narrative   Divorced, 3 children, 9 grandchildren   Family History  Problem Relation Age of Onset  . Congestive Heart Failure Brother   . Atrial fibrillation Brother   . Congestive Heart Failure Mother        died 50  . Kidney Stones Mother   . Deafness Mother   . Atrial fibrillation Mother   . Congestive Heart Failure Father   . Deafness Father   . Healthy Sister   . Healthy Sister   . Healthy Sister   . Tuberculosis Paternal  Grandfather    Allergies  Allergen Reactions  . Ivp Dye [Iodinated Diagnostic Agents] Hives and Other (See Comments)  . Penicillins Anaphylaxis    Has patient had a PCN reaction causing immediate rash, facial/tongue/throat swelling, SOB or lightheadedness with hypotension: No Has patient had a PCN reaction causing severe rash involving mucus membranes or skin necrosis: No Has patient had a PCN reaction that required hospitalization No Has patient had a PCN reaction occurring within the last 10 years:No If all of the above answers are "NO", then may proceed with Cephalosporin use.  . Sulfonamide Derivatives Other (See Comments)    Migraines  . Tessalon [Benzonatate] Rash and Hives    Severe rash  . Sulfa Antibiotics Other (See Comments)    Migraine  . Rosuvastatin Calcium Other (See Comments)    Unknown reaction  . Onion Other (See Comments)    Raw onions cause migraines   Prior to Admission medications   Medication Sig Start Date End Date Taking? Authorizing Provider  albuterol (PROVENTIL HFA;VENTOLIN HFA) 108 (90 Base) MCG/ACT inhaler Inhale 2 puffs into the lungs every 6 (six) hours as needed. For wheezing. Use 2 puffs 3 times daily x5 days and then every 6 hours as needed. Patient taking differently: Inhale 2 puffs into the lungs every 6 (six) hours as needed for wheezing.  09/26/17  Yes Eugenie Filler, MD  alendronate (FOSAMAX) 70 MG tablet Take 70 mg by mouth every Sunday.    Yes [provider]  carvedilol (COREG) 12.5 MG tablet TAKE 1 TABLET BY MOUTH TWICE DAILY WITH A MEAL Patient taking differently: Take 12.5 mg by mouth 2 (two) times daily.  06/21/17  Yes Evans Lance, MD  cetirizine (ZYRTEC) 10 MG tablet Take 10 mg by mouth daily.   Yes [provider]  Cholecalciferol (VITAMIN D3) 5000 units CAPS Take 5,000 Units by mouth daily.   Yes [provider]  citalopram (CELEXA) 20 MG tablet Take 20 mg by mouth daily.    Yes [provider]   Cyanocobalamin (VITAMIN B-12) 5000 MCG LOZG Take 5,000 mcg by mouth daily.   Yes [provider]  fluticasone (FLONASE) 50 MCG/ACT nasal spray Place 2 sprays into both nostrils daily.   Yes [provider]  fluticasone furoate-vilanterol (BREO ELLIPTA) 200-25 MCG/INH AEPB Inhale 1 puff into the lungs daily. 02/03/17  Yes Mannam, Praveen, MD  furosemide (LASIX) 40 MG tablet TAKE 1 TO 2 TABLETS BY MOUTH DAILY AS DIRECTED FOR EDEMA, SHORTNESS OF BREATH AND/OR WEIGHT GAIN Patient taking differently: Take 80  mg by mouth daily.  05/16/17  Yes Jettie Booze, MD  ipratropium-albuterol (DUONEB) 0.5-2.5 (3) MG/3ML SOLN Take 3 mLs by nebulization every 4 (four) hours as needed. Patient taking differently: Take 3 mLs by nebulization every 4 (four) hours as needed (shortness of breath).  10/09/17  Yes Lauraine Rinne, NP  losartan (COZAAR) 25 MG tablet TAKE 1 TABLET BY MOUTH TWICE DAILY Patient taking differently: Take 25 mg by mouth 2 (two) times daily.  03/22/17  Yes Jettie Booze, MD  pantoprazole (PROTONIX) 40 MG tablet Take 40 mg by mouth daily. 08/29/17  Yes [provider]  spironolactone (ALDACTONE) 25 MG tablet TAKE 1 TABLET BY MOUTH ONCE DAILY Patient taking differently: TAKE 1 TABLET (25 MG TOTALLY) BY MOUTH ONCE DAILY 05/05/17  Yes Jettie Booze, MD     Positive ROS: none  All other systems have been reviewed and were otherwise negative with the exception of those mentioned in the HPI and as above.  Physical Exam: Vitals:   11/29/17 1131 11/29/17 1140  BP:  111/62  Pulse: 69 74  Resp: 16 13  Temp:    SpO2: 100% 98%    General: Alert, no acute distress Cardiovascular: No pedal edema Respiratory: No cyanosis, no use of accessory musculature GI: No organomegaly, abdomen is soft and non-tender Skin: No lesions in the area of chief complaint Neurologic: Sensation intact distally Psychiatric: Patient is competent for consent with normal mood and  affect Lymphatic: No axillary or cervical lymphadenopathy  MUSCULOSKELETAL: l knee+lat jt line tender +mcmurray -instability  Assessment/Plan: LEFT KNEE LATERAL MENISCAL TEAR Plan for Procedure(s): ARTHROSCOPY LEFT KNEE  The risks benefits and alternatives were discussed with the patient including but not limited to the risks of nonoperative treatment, versus surgical intervention including infection, bleeding, nerve injury, malunion, nonunion, hardware prominence, hardware failure, need for hardware removal, blood clots, cardiopulmonary complications, morbidity, mortality, among others, and they were willing to proceed.  Predicted outcome is good, although there will be at least a six to nine month expected recovery.  Alta Corning, MD 11/29/2017 12:37 PM

## 2017-11-29 NOTE — Brief Op Note (Signed)
11/29/2017  1:33 PM  PATIENT:  Paula Booker  72 y.o. female  PRE-OPERATIVE DIAGNOSIS:  LEFT KNEE LATERAL MENISCAL TEAR  POST-OPERATIVE DIAGNOSIS:  * No post-op diagnosis entered *  PROCEDURE:  Procedure(s): ARTHROSCOPY LEFT KNEE (Left)  SURGEON:  Surgeon(s) and Role:    Dorna Leitz, MD - Primary  PHYSICIAN ASSISTANT:   ASSISTANTS: jim bethuine pac   ANESTHESIA:   general  EBL:  none   BLOOD ADMINISTERED:none  DRAINS: none   LOCAL MEDICATIONS USED:  MARCAINE     SPECIMEN:  No Specimen  DISPOSITION OF SPECIMEN:  N/A  COUNTS:  YES  TOURNIQUET:  * No tourniquets in log *  DICTATION: .Other Dictation: Dictation Number [id number]  PLAN OF CARE: Admit to inpatient   PATIENT DISPOSITION:  PACU - hemodynamically stable.   Delay start of Pharmacological VTE agent (>24hrs) due to surgical blood loss or risk of bleeding: no

## 2017-11-29 NOTE — Anesthesia Postprocedure Evaluation (Signed)
Anesthesia Post Note  Patient: Merridy Pascoe  Procedure(s) Performed: ARTHROSCOPY LEFT KNEE (Left Knee)     Patient location during evaluation: PACU Anesthesia Type: General Level of consciousness: awake and alert Pain management: pain level controlled Vital Signs Assessment: post-procedure vital signs reviewed and stable Respiratory status: spontaneous breathing, nonlabored ventilation, respiratory function stable and patient connected to nasal cannula oxygen Cardiovascular status: blood pressure returned to baseline and stable Postop Assessment: no apparent nausea or vomiting Anesthetic complications: no    Last Vitals:  Vitals:   11/29/17 1430 11/29/17 1515  BP: (!) 111/94 110/80  Pulse: 71 71  Resp: 13   Temp: 36.7 C 36.7 C  SpO2: 99% 99%    Last Pain:  Vitals:   11/29/17 1430  TempSrc:   PainSc: 0-No pain                 Karandeep Resende

## 2017-11-29 NOTE — Anesthesia Procedure Notes (Signed)
Anesthesia Regional Block: Knee block   Pre-Anesthetic Checklist: ,, timeout performed, Correct Patient, Correct Site, Correct Laterality, Correct Procedure, Correct Position, site marked, Risks and benefits discussed,  Surgical consent,  Pre-op evaluation,  At surgeon's request and post-op pain management  Laterality: Left  Prep: chloraprep       Needles:  Injection technique: Single-shot     Needle Length: 5cm  Needle Gauge: 22     Additional Needles:   Narrative:  Start time: 11/29/2017 11:30 AM End time: 11/29/2017 11:35 AM Injection made incrementally with aspirations every 5 mL.  Performed by: Personally  Anesthesiologist: Janeece Riggers, MD  Additional Notes: Functioning IV was confirmed and monitors were applied.  A 34mm 22ga Arrow echogenic stimulator needle was used. Sterile prep and drape,hand hygiene and sterile gloves were used.

## 2017-11-29 NOTE — Discharge Instructions (Signed)
POST-OP KNEE ARTHROSCOPY INSTRUCTIONS  °Dr. John Graves/Jim Laquetta Racey PA-C ° °Pain °You will be expected to have a moderate amount of pain in the affected knee for approximately two weeks. However, the first two days will be the most severe pain. A prescription has been provided to take as needed for the pain. The pain can be reduced by applying ice packs to the knee for the first 1-2 weeks post surgery. Also, keeping the leg elevated on pillows will help alleviate the pain. If you develop any acute pain or swelling in your calf muscle, please call the doctor. ° °Activity °It is preferred that you stay at bed rest for approximately 24 hours. However, you may go to the bathroom with help. Weight bearing as tolerated. You may begin the knee exercises the day of surgery. Discontinue crutches as the knee pain resolves. ° °Dressing °Keep the dressing dry. If the ace bandage should wrinkle or roll up, this can be rewrapped to prevent ridges in the bandage. You may remove all dressings in 48 hours,  apply bandaids to each wound. You may shower on the 4th day after surgery but no tub bath. ° °Symptoms to report to your doctor °Extreme pain °Extreme swelling °Temperature above 101 degrees °Change in the feeling, color, or movement of your toes °Redness, heat, or swelling at your incision ° °Exercise °If is preferred that as soon as possible you try to do a straight leg raise without bending the knee and concentrate on bringing the heel of your foot off the bed up to approximately 45 degrees and hold for the count of 10 seconds. Repeat this at least 10 times three or four times per day. Additional exercises are provided below. ° °You are encouraged to bend the knee as tolerated. ° °Follow-Up °Call to schedule a follow-up appointment in 5-7 days. Our office # is [phone]. ° °POST-OP EXERCISES ° °Short Arc Quads ° °1. Lie on back with legs straight. Place towel roll under thigh, just above knee. °2. Tighten thigh muscles to  straighten knee and lift heel off bed. °3. Hold for slow count of five, then lower. °4. Do three sets of ten ° ° ° °Straight Leg Raises ° °1. Lie on back with operative leg straight and other leg bent. °2. Keeping operative leg completely straight, slowly lift operative leg so foot is 5 inches off bed. °3. Hold for slow count of five, then lower. °4. Do three sets of ten. ° ° ° °DO BOTH EXERCISES 2 TIMES A DAY ° °Ankle Pumps ° °Work/move the operative ankle and foot up and down 10 times every hour while awake. °

## 2017-11-29 NOTE — Op Note (Signed)
[name], ROSENFIELD MEDICAL RECORD ZO:[id number] ACCOUNT [id number] DATE OF BIRTH:03-06-1946 FACILITY: [facility] LOCATION: [facility] PHYSICIAN:Nation Cradle Maudie Mercury, MD  OPERATIVE REPORT  DATE OF PROCEDURE:  11/29/2017  PREOPERATIVE DIAGNOSIS:  Lateral meniscal tear.  POSTOPERATIVE DIAGNOSES:  Medial meniscal tear chondromalacia, medial and patellofemoral joint and medial shelf plica.  PROCEDURE: 1.  Left knee arthroscopy with partial medial meniscectomy. 2.  Chondroplasty medial and patellofemoral joint. 3.  Medial shelf plica excision.  SURGEON:  Dorna Leitz, MD  ASSISTANT:  Gaspar Skeeters, PA-C.  ANESTHESIA:  General.  BRIEF HISTORY:  The patient is a 72 year old female with a long history of significant complaints of left knee pain we treated conservatively for a period of time.  After failure of conservative care and having significant anterior pain, predominantly on  the lateral side, she was taken to the operating room for arthroscopic evaluation and fixation as needed.  DESCRIPTION OF PROCEDURE:  The patient brought to the operating room after adequate anesthesia was obtained with general anesthetic, the patient was brought to the operating table.  Left leg was prepped and draped in the usual sterile fashion.  Following  this, routine arthroscopic examination of knee revealed that there was significant medial shelf plica.  This was debrided back to a smooth stable rim.  There was chondromalacia patellofemoral joint with essentially bone exposed over the lateral femoral  condyle anteriorly.  Attention was then turned to the medial compartment, there was a posterior horn medial meniscus tear as well as an anterior horn medial meniscal tear which could be the symptomatic portion of what she was experiencing.  This was  debrided with a straight biting forceps and suction shaver.  A remnant of the remaining plica was debrided out of the anterior portion of the knee and attention turned over  laterally where the lateral compartment was evaluated and debrided.  There was  some fraying of the outer portion of the meniscus but certainly not anything that looked significant.  We cleaned this up a little bit.  At this point, the knee was copiously and thoroughly irrigated and suctioned dry.  The arthroscopic portals were  closed with a bandage, 25 mL of 0.5% Marcaine was instilled for postoperative anesthesia.  Sterile compressive dressing was applied and the patient was taken to recovery and was noted to be in satisfactory condition with estimated blood loss for  procedure being minimal.  TN/NUANCE  D:11/29/2017 T:11/29/2017 JOB:[id number]

## 2017-11-30 ENCOUNTER — Encounter (HOSPITAL_COMMUNITY): Payer: Self-pay | Admitting: Orthopedic Surgery

## 2017-11-30 NOTE — Telephone Encounter (Signed)
Attempted to reach pt. Left voicemail for pt to call back

## 2017-12-05 DIAGNOSIS — S8002XA Contusion of left knee, initial encounter: Secondary | ICD-10-CM | POA: Diagnosis not present

## 2017-12-12 DIAGNOSIS — I509 Heart failure, unspecified: Secondary | ICD-10-CM | POA: Diagnosis not present

## 2017-12-12 DIAGNOSIS — J4530 Mild persistent asthma, uncomplicated: Secondary | ICD-10-CM | POA: Diagnosis not present

## 2017-12-12 DIAGNOSIS — R0602 Shortness of breath: Secondary | ICD-10-CM | POA: Diagnosis not present

## 2017-12-19 DIAGNOSIS — Z9889 Other specified postprocedural states: Secondary | ICD-10-CM | POA: Diagnosis not present

## 2017-12-21 ENCOUNTER — Ambulatory Visit (INDEPENDENT_AMBULATORY_CARE_PROVIDER_SITE_OTHER): Payer: Medicare HMO | Admitting: *Deleted

## 2017-12-21 ENCOUNTER — Ambulatory Visit (INDEPENDENT_AMBULATORY_CARE_PROVIDER_SITE_OTHER): Payer: Medicare HMO

## 2017-12-21 DIAGNOSIS — Z95810 Presence of automatic (implantable) cardiac defibrillator: Secondary | ICD-10-CM | POA: Diagnosis not present

## 2017-12-21 DIAGNOSIS — I428 Other cardiomyopathies: Secondary | ICD-10-CM

## 2017-12-21 DIAGNOSIS — I5022 Chronic systolic (congestive) heart failure: Secondary | ICD-10-CM | POA: Diagnosis not present

## 2017-12-21 NOTE — Progress Notes (Signed)
Remote ICD transmission.   

## 2017-12-21 NOTE — Progress Notes (Signed)
EPIC Encounter for ICM Monitoring  Patient Name: Paula Booker is a 72 y.o. female Date: 12/21/2017 Primary Care Physican: [name], MD Primary [place] Electrophysiologist: Druscilla Brownie Weight: 200lbs         Heart Failure questions reviewed, pt asymptomatic but did gain some weight during outpatient knee surgery.   She has resumed walking and knee is getting better.   Thoracic impedance abnormal suggesting fluid accumulation starting 12/12/2017.  Impedance also decreased during 11/25/2017 through 12/10/2017 which correlates with outpatient knee surgery.  Prescribed dosage: Furosemide 40 mg 1 to 2 tablets(40 to 80 mg total)daily as directed for edema, SOB and/or weightgain.  Labs: 09/26/2017 Creatinine0.95, Fabian November, Potassium3.9, Sodium138, EGFR59->60 09/25/2017 Creatinine0.92, BUN28, Potassium4.2, [id number], EGFR>60  09/24/2017 Creatinine1.06, BUN27, Potassium3.7, Sodium132, [id number]  09/23/2017 Creatinine0.85, BUN18, Potassium4.0, Sodium136, EGFR>60 07/27/2017 Creatinine 0.92, BUN 16, Potassium 3.5, Sodium 137, EGFR >60  Recommendations: Advised to limit salt and fluid intake.    Follow-up plan: ICM clinic phone appointment on 01/02/2018 to recheck fluid levels.      Copy of ICM check sent to Dr. Lovena Le and Dr Beau Fanny.   3 month ICM trend: 12/21/2017    1 Year ICM trend:       [name], RN 12/21/2017 2:24 PM

## 2018-01-02 ENCOUNTER — Ambulatory Visit (INDEPENDENT_AMBULATORY_CARE_PROVIDER_SITE_OTHER): Payer: Medicare HMO

## 2018-01-02 DIAGNOSIS — Z95810 Presence of automatic (implantable) cardiac defibrillator: Secondary | ICD-10-CM

## 2018-01-02 DIAGNOSIS — I5022 Chronic systolic (congestive) heart failure: Secondary | ICD-10-CM

## 2018-01-02 NOTE — Progress Notes (Signed)
EPIC Encounter for ICM Monitoring  Patient Name: Paula Booker is a 72 y.o. female Date: 01/02/2018 Primary Care Physican: [name], MD Primary [place] Electrophysiologist: Lovena Le Dry Weight:200lbs        Heart Failure questions reviewed, pt asymptomatic.    Thoracic impedance returned to normal after last ICM remote transmission on 12/21/2017 but has dropped below baseline in the last few days suggesting fluid accumulation may be restarting.  Prescribed: Furosemide 40 mg 1 to 2 tablets(40 to 80 mg total)daily as directed for edema, SOB and/or weightgain.   Labs: 09/26/2017 Creatinine0.95, Fabian November, Potassium3.9, Sodium138, EGFR59->60 09/25/2017 Creatinine0.92, BUN28, Potassium4.2, [id number], EGFR>60  09/24/2017 Creatinine1.06, BUN27, Potassium3.7, Sodium132, [id number]  09/23/2017 Creatinine0.85, BUN18, Potassium4.0, Sodium136, EGFR>60 07/27/2017 Creatinine 0.92, BUN 16, Potassium 3.5, Sodium 137, EGFR >60  Recommendations:  Reinforced salt restriction to < 2000 mg daily and fluid intake to 64 oz daily.  Encouraged to call for fluid symptoms.  Follow-up plan: ICM clinic phone appointment on 01/22/2018.      Copy of ICM check sent to Dr. Lovena Le and Dr Irish Lack.   3 month ICM trend: 01/02/2018    1 Year ICM trend:       [name], RN 01/02/2018 10:50 AM

## 2018-01-10 LAB — CUP PACEART REMOTE DEVICE CHECK
Battery Voltage: 2.98 V
Brady Statistic AP VP Percent: 0.04 %
Brady Statistic AS VP Percent: 0.03 %
Brady Statistic AS VS Percent: 84.36 %
Brady Statistic RA Percent Paced: 15 %
Brady Statistic RV Percent Paced: 0.06 %
HIGH POWER IMPEDANCE MEASURED VALUE: 85 Ohm
Implantable Lead Implant Date: 20131212
Implantable Lead Location: 753859
Implantable Lead Model: 5076
Implantable Lead Model: 6935
Lead Channel Impedance Value: 475 Ohm
Lead Channel Impedance Value: 665 Ohm
Lead Channel Pacing Threshold Pulse Width: 0.4 ms
Lead Channel Pacing Threshold Pulse Width: 0.4 ms
Lead Channel Sensing Intrinsic Amplitude: 3.625 mV
Lead Channel Sensing Intrinsic Amplitude: 8 mV
Lead Channel Sensing Intrinsic Amplitude: 8 mV
Lead Channel Setting Pacing Amplitude: 2 V
Lead Channel Setting Pacing Amplitude: 2.5 V
Lead Channel Setting Pacing Pulse Width: 0.4 ms
Lead Channel Setting Sensing Sensitivity: 0.3 mV
MDC IDC LEAD IMPLANT DT: 20131212
MDC IDC LEAD LOCATION: 753860
MDC IDC MSMT BATTERY REMAINING LONGEVITY: 58 mo
MDC IDC MSMT LEADCHNL RA PACING THRESHOLD AMPLITUDE: 0.375 V
MDC IDC MSMT LEADCHNL RA SENSING INTR AMPL: 3.625 mV
MDC IDC MSMT LEADCHNL RV IMPEDANCE VALUE: 817 Ohm
MDC IDC MSMT LEADCHNL RV PACING THRESHOLD AMPLITUDE: 0.625 V
MDC IDC PG IMPLANT DT: 20131212
MDC IDC SESS DTM: 20190912062710
MDC IDC STAT BRADY AP VS PERCENT: 15.57 %

## 2018-01-11 DIAGNOSIS — I509 Heart failure, unspecified: Secondary | ICD-10-CM | POA: Diagnosis not present

## 2018-01-11 DIAGNOSIS — R0602 Shortness of breath: Secondary | ICD-10-CM | POA: Diagnosis not present

## 2018-01-11 DIAGNOSIS — J4530 Mild persistent asthma, uncomplicated: Secondary | ICD-10-CM | POA: Diagnosis not present

## 2018-01-15 DIAGNOSIS — M25562 Pain in left knee: Secondary | ICD-10-CM | POA: Diagnosis not present

## 2018-01-16 ENCOUNTER — Other Ambulatory Visit: Payer: Self-pay | Admitting: Pulmonary Disease

## 2018-01-16 DIAGNOSIS — R0602 Shortness of breath: Secondary | ICD-10-CM

## 2018-01-16 DIAGNOSIS — Z9889 Other specified postprocedural states: Secondary | ICD-10-CM | POA: Diagnosis not present

## 2018-01-16 DIAGNOSIS — M25562 Pain in left knee: Secondary | ICD-10-CM | POA: Diagnosis not present

## 2018-01-18 ENCOUNTER — Encounter: Payer: Self-pay | Admitting: Pulmonary Disease

## 2018-01-18 ENCOUNTER — Ambulatory Visit (INDEPENDENT_AMBULATORY_CARE_PROVIDER_SITE_OTHER): Payer: Medicare HMO | Admitting: Pulmonary Disease

## 2018-01-18 ENCOUNTER — Ambulatory Visit: Payer: Medicare HMO | Admitting: Internal Medicine

## 2018-01-18 DIAGNOSIS — R0602 Shortness of breath: Secondary | ICD-10-CM | POA: Diagnosis not present

## 2018-01-18 MED ORDER — ALBUTEROL SULFATE HFA 108 (90 BASE) MCG/ACT IN AERS: 2.0000 | INHALATION_SPRAY | Freq: Four times a day (QID) | RESPIRATORY_TRACT | 5 refills | Status: DC | PRN

## 2018-01-18 MED ORDER — FLUTICASONE FUROATE-VILANTEROL 200-25 MCG/INH IN AEPB: 1.0000 | INHALATION_SPRAY | Freq: Every day | RESPIRATORY_TRACT | 6 refills | Status: DC

## 2018-01-18 NOTE — Progress Notes (Signed)
Paula Booker    [id number]    02-Nov-1945  Primary Care Physician:[name], MD  Referring Physician: [name], [place], [place] [id number]  Chief complaint:  Follow up for  Mild persistent asthma  HPI: 72 Y/O with history of nonischemic cardiomyopathy, syncope, WPW syndrome status post ICD implant, asthma.  She is complains of dyspnea since 2001 but reports worsening symptoms over the past 3 months. She has been evaluated in the pulmonary office in 2014 with normal PFTs. She was given [facility] inhaler for her asthma but does not appear to have improved her symptoms.  Her chief complaint is dyspnea with activity and at rest, cough with no sputum production, occasional wheezing, denies any fevers, chills, hemoptysis. As noted by Dr. Sheryn Bison, PCP she has been active at home remodeling her kitchen cabinets without any issue. She recently got a Fitbit fitness tracker and is currently doing 2500 steps per day. She has plans to increase it to 10,000 steps per day. She has also lost 6 pounds recently and plans on losing 6 more pounds. Current meds include albuterol inhaler which she uses rarely.  Hospitalized in June 2019 for dyspnea, asthma exacerbation.   Pets: 3 cats. No dogs, birds, exotic animals Occupation: Retired. She used to work as a Engineer, manufacturing systems, Pharmacist, hospital, Optometrist Exposures: No mold issues, no exposure to asbestos. No hot tubs, Jacuzzi at home Smoking history: Never smoker  Interval history: Continues on [place].  States that breathing is slightly improved.  Continues to exercise and is tracking her steps using Fitbit tracker.  Outpatient Encounter Medications as of 01/18/2018  Medication Sig  . albuterol (PROVENTIL HFA;VENTOLIN HFA) 108 (90 Base) MCG/ACT inhaler Inhale 2 puffs into the lungs every 6 (six) hours as needed for wheezing. For wheezing. Use 2 puffs 3 times daily x5 days and then every 6 hours as needed.  Marland Kitchen alendronate  (FOSAMAX) 70 MG tablet Take 70 mg by mouth every Sunday.   . carvedilol (COREG) 12.5 MG tablet TAKE 1 TABLET BY MOUTH TWICE DAILY WITH A MEAL (Patient taking differently: Take 12.5 mg by mouth 2 (two) times daily. )  . cetirizine (ZYRTEC) 10 MG tablet Take 10 mg by mouth daily.  . Cholecalciferol (VITAMIN D3) 5000 units CAPS Take 5,000 Units by mouth daily.  . citalopram (CELEXA) 20 MG tablet Take 20 mg by mouth daily.   . Cyanocobalamin (VITAMIN B-12) 5000 MCG LOZG Take 5,000 mcg by mouth daily.  . fluticasone (FLONASE) 50 MCG/ACT nasal spray Place 2 sprays into both nostrils daily.  . fluticasone furoate-vilanterol (BREO ELLIPTA) 200-25 MCG/INH AEPB Inhale 1 puff into the lungs daily.  . furosemide (LASIX) 40 MG tablet TAKE 1 TO 2 TABLETS BY MOUTH DAILY AS DIRECTED FOR EDEMA, SHORTNESS OF BREATH AND/OR WEIGHT GAIN (Patient taking differently: Take 80 mg by mouth daily. )  . ipratropium-albuterol (DUONEB) 0.5-2.5 (3) MG/3ML SOLN Take 3 mLs by nebulization every 4 (four) hours as needed. (Patient taking differently: Take 3 mLs by nebulization every 4 (four) hours as needed (shortness of breath). )  . losartan (COZAAR) 25 MG tablet TAKE 1 TABLET BY MOUTH TWICE DAILY (Patient taking differently: Take 25 mg by mouth 2 (two) times daily. )  . pantoprazole (PROTONIX) 40 MG tablet Take 40 mg by mouth daily.  Marland Kitchen spironolactone (ALDACTONE) 25 MG tablet TAKE 1 TABLET BY MOUTH ONCE DAILY (Patient taking differently: TAKE 1 TABLET (25 MG TOTALLY) BY MOUTH ONCE  DAILY)  . [DISCONTINUED] albuterol (PROVENTIL HFA;VENTOLIN HFA) 108 (90 Base) MCG/ACT inhaler Inhale 2 puffs into the lungs every 6 (six) hours as needed. For wheezing. Use 2 puffs 3 times daily x5 days and then every 6 hours as needed. (Patient taking differently: Inhale 2 puffs into the lungs every 6 (six) hours as needed for wheezing. )  . [DISCONTINUED] BREO ELLIPTA 200-25 MCG/INH AEPB INHALE 1 PUFF BY MOUTH ONCE DAILY  . [DISCONTINUED]  HYDROcodone-acetaminophen (NORCO) 5-325 MG tablet Take 1-2 tablets by mouth every 6 (six) hours as needed for moderate pain.   No facility-administered encounter medications on file as of 01/18/2018.    Physical Exam: Blood pressure 130/78, pulse 77, height 5' 3.5" (1.613 m), weight 203 lb 12.8 oz (92.4 kg), SpO2 95 %. Gen:      No acute distress HEENT:  EOMI, sclera anicteric Neck:     No masses; no thyromegaly Lungs:    Clear to auscultation bilaterally; normal respiratory effort CV:         Regular rate and rhythm; no murmurs Abd:      + bowel sounds; soft, non-tender; no palpable masses, no distension Ext:    No edema; adequate peripheral perfusion Skin:      Warm and dry; no rash Neuro: alert and oriented x 3 Psych: normal mood and affect  Data Reviewed: Imaging CT chest 05/22/14-mild bilateral groundglass opacities consistent with edema CT abdomen, pelvis 03/18/15-left basilar consolidation Chest x-ray 07/27/2017- no acute cardiopulmonary abnormality. Chest x-ray 09/23/2017- no acute cardiopulmonary abnormality. I reviewed the images personally.  PFTs FENO 09/20/16- 19 FENO 11/13/2017-15  Spirometry 05/10/12 FVC 2.15 [75%), FEV1 1.64 [72%), F/F 76  PFTs 10/17/12 FVC 2.24 [80%), FEV1 1.75 [70%), F/F 78, TLC 88%, DLCO 93% Normal study  PFTs 01/26/17 FVC 2.21 [77%], FEV1 1.65 [77%], F/F 75, TLC 86%, DLCO 86% Small airways disease with significant bronchodilator response  Peak inspiratory flow 09/11/2017-60  Labs Blood allergy profile 01/26/2017-IgE 13, RAST panel shows sensitivity to dust mite CBC 01/26/2017-WBC 8.5, eos 4.7%, absolute eosinophil count 400  Assessment:  Mild persistent asthma Even though FENO is low she has elevated peripheral eos and the PFTs show significant reduction in mid flow rates with bronchodilator response.  Continues on Breo, albuterol as needed with inhaler and nebulizer.  Suspected dyspnea is contributed by obesity and deconditioning.   Encouraged weight loss and exercise. Declined to do pulmonary rehab due to costs involved.   GERD On Prilosec.  Follows with [place] maintenance 01/13/17-influenza. Will get flu vaccine at PCP this year.  03/19/15-Pneumovax  Plan/Recommendations:  - Continue breo, albuterol PRN. - Continue weight loss and exercise.  Marshell Garfinkel MD [facility] 01/18/2018, 9:31 AM  CC: [name], MD

## 2018-01-18 NOTE — Patient Instructions (Signed)
I am glad your breathing is better Continue to work on your exercise We will continue the Breo Follow-up in 6 months.

## 2018-01-22 ENCOUNTER — Ambulatory Visit (INDEPENDENT_AMBULATORY_CARE_PROVIDER_SITE_OTHER): Payer: Medicare HMO

## 2018-01-22 DIAGNOSIS — Z95810 Presence of automatic (implantable) cardiac defibrillator: Secondary | ICD-10-CM | POA: Diagnosis not present

## 2018-01-22 DIAGNOSIS — I5022 Chronic systolic (congestive) heart failure: Secondary | ICD-10-CM

## 2018-01-22 DIAGNOSIS — M47816 Spondylosis without myelopathy or radiculopathy, lumbar region: Secondary | ICD-10-CM | POA: Diagnosis not present

## 2018-01-22 NOTE — Progress Notes (Signed)
EPIC Encounter for ICM Monitoring  Patient Name: Paula Booker is a 72 y.o. female Date: 01/22/2018 Primary Care Physican: [name], MD Primary [place] Electrophysiologist: Lovena Le Dry Weight:200lbs       Heart Failure questions reviewed, pt asymptomatic.  She reports being very strict with salt and fluid intake.   Thoracic impedance abnormal suggesting fluid accumulation starting 12/25/2017.    Prescribed: Furosemide 40 mg 1 to 2 tablets(40 to 80 mg total)daily as directed for edema, SOB and/or weightgain.  She always takes Furosemide 40 mg 2 tablets (80 mg total) daily.  Labs: 11/28/2017 Creatinine 0.97, BUN 15, Potassium 3.7, Sodium 140, eGFR 57->60 09/26/2017 Creatinine0.95, BUN26, Potassium3.9, Sodium138, EGFR59->60 09/25/2017 Creatinine0.92, BUN28, Potassium4.2, Sodium136, EGFR>60  09/24/2017 Creatinine1.06, BUN27, Potassium3.7, Sodium132, EGFR52-60  09/23/2017 Creatinine0.85, BUN18, Potassium4.0, Sodium136, EGFR>60 07/27/2017 Creatinine 0.92, BUN 16, Potassium 3.5, Sodium 137, EGFR >60  Recommendations:  Advised to take Furosemide 80 mg every AM and 40 mg in PM x 3 days and then return to 80 mg daily.    Follow-up plan: ICM clinic phone appointment on 01/26/2018 (manual send) to recheck fluid levels.       Copy of ICM check sent to Dr. Lovena Le and Dr Irish Lack.   3 month ICM trend: 01/22/2018    1 Year ICM trend:       [name], RN 01/22/2018 4:00 PM

## 2018-01-26 ENCOUNTER — Telehealth: Payer: Self-pay | Admitting: Cardiology

## 2018-01-26 ENCOUNTER — Ambulatory Visit (INDEPENDENT_AMBULATORY_CARE_PROVIDER_SITE_OTHER): Payer: Self-pay

## 2018-01-26 DIAGNOSIS — Z95810 Presence of automatic (implantable) cardiac defibrillator: Secondary | ICD-10-CM

## 2018-01-26 DIAGNOSIS — I5022 Chronic systolic (congestive) heart failure: Secondary | ICD-10-CM

## 2018-01-26 NOTE — Telephone Encounter (Signed)
LMOVM reminding pt to send remote transmission.   

## 2018-01-26 NOTE — Progress Notes (Signed)
EPIC Encounter for ICM Monitoring  Patient Name: Paula Booker is a 72 y.o. female Date: 01/26/2018 Primary Care Physican: [name], MD Primary [place] Electrophysiologist: Lovena Le Dry Weight:197lbs             Heart Failure questions reviewed, pt lost 7 lbs from taking extra Furosemide.   Thoracic impedance improved but remains abnormal suggesting fluid accumulation after taking 3 days of extra 40 mg of Furosemide.    Prescribed: Furosemide 40 mg 1 to 2 tablets(40 to 80 mg total)daily as directed for edema, SOB and/or weightgain.  She always takes Furosemide 40 mg 2 tablets (80 mg total) daily.  Labs: 11/28/2017 Creatinine 0.97, BUN 15, Potassium 3.7, Sodium 140, eGFR 57->60 09/26/2017 Creatinine0.95, BUN26, Potassium3.9, Sodium138, EGFR59->60 09/25/2017 Creatinine0.92, BUN28, Potassium4.2, Sodium136, EGFR>60  09/24/2017 Creatinine1.06, BUN27, Potassium3.7, Sodium132, EGFR52-60  09/23/2017 Creatinine0.85, BUN18, Potassium4.0, Sodium136, EGFR>60 07/27/2017 Creatinine 0.92, BUN 16, Potassium 3.5, Sodium 137, EGFR >60  Recommendations: Advised to continue to limit salt intake and call for fluid symptoms.  Follow-up plan: ICM clinic phone appointment on 02/06/2018 to recheck fluid levels.      Copy of ICM check sent to Dr. Lovena Le.   3 month ICM trend: 01/26/2018    1 Year ICM trend:       [name], RN 01/26/2018 2:04 PM

## 2018-01-29 NOTE — Telephone Encounter (Signed)
Called and left message for patient to call back regarding MyChart message.

## 2018-01-30 NOTE — Telephone Encounter (Signed)
Left message for patient to call back  

## 2018-01-31 DIAGNOSIS — R5383 Other fatigue: Secondary | ICD-10-CM | POA: Diagnosis not present

## 2018-01-31 DIAGNOSIS — I10 Essential (primary) hypertension: Secondary | ICD-10-CM | POA: Diagnosis not present

## 2018-01-31 DIAGNOSIS — J4520 Mild intermittent asthma, uncomplicated: Secondary | ICD-10-CM | POA: Diagnosis not present

## 2018-01-31 DIAGNOSIS — Z23 Encounter for immunization: Secondary | ICD-10-CM | POA: Diagnosis not present

## 2018-01-31 DIAGNOSIS — K219 Gastro-esophageal reflux disease without esophagitis: Secondary | ICD-10-CM | POA: Diagnosis not present

## 2018-01-31 DIAGNOSIS — Z1211 Encounter for screening for malignant neoplasm of colon: Secondary | ICD-10-CM | POA: Diagnosis not present

## 2018-01-31 DIAGNOSIS — M170 Bilateral primary osteoarthritis of knee: Secondary | ICD-10-CM | POA: Diagnosis not present

## 2018-01-31 DIAGNOSIS — Z1239 Encounter for other screening for malignant neoplasm of breast: Secondary | ICD-10-CM | POA: Diagnosis not present

## 2018-01-31 DIAGNOSIS — I509 Heart failure, unspecified: Secondary | ICD-10-CM | POA: Diagnosis not present

## 2018-02-01 DIAGNOSIS — M47816 Spondylosis without myelopathy or radiculopathy, lumbar region: Secondary | ICD-10-CM | POA: Diagnosis not present

## 2018-02-02 ENCOUNTER — Other Ambulatory Visit: Payer: Self-pay | Admitting: Family Medicine

## 2018-02-02 DIAGNOSIS — Z1231 Encounter for screening mammogram for malignant neoplasm of breast: Secondary | ICD-10-CM

## 2018-02-04 ENCOUNTER — Other Ambulatory Visit: Payer: Self-pay | Admitting: Interventional Cardiology

## 2018-02-06 ENCOUNTER — Ambulatory Visit (INDEPENDENT_AMBULATORY_CARE_PROVIDER_SITE_OTHER): Payer: Medicare HMO

## 2018-02-06 DIAGNOSIS — Z95810 Presence of automatic (implantable) cardiac defibrillator: Secondary | ICD-10-CM

## 2018-02-06 DIAGNOSIS — I5022 Chronic systolic (congestive) heart failure: Secondary | ICD-10-CM

## 2018-02-06 NOTE — Op Note (Signed)
[name], [name] MEDICAL RECORD [id number] ACCOUNT [id number] DATE OF BIRTH:1945/12/08 FACILITY: [facility] LOCATION: [facility] PHYSICIAN:Darris Staiger Maudie Mercury, MD  OPERATIVE REPORT  DATE OF PROCEDURE:  11/29/2017  ADDENDUM  The patient had severe degenerative changes in the medial and patellofemoral compartments.  These were grade III and grade IV in nature.  A chondroplasty was performed debriding cartilage down to bleeding bone in both the medial and patellofemoral  compartments.  TN/NUANCE  D:02/06/2018 T:02/06/2018 JOB:[id number]

## 2018-02-07 ENCOUNTER — Ambulatory Visit: Payer: Medicare HMO

## 2018-02-07 NOTE — Progress Notes (Signed)
EPIC Encounter for ICM Monitoring  Patient Name: Paula Booker is a 72 y.o. female Date: 02/07/2018 Primary Care Physican: [name], MD Primary [place] Electrophysiologist: Druscilla Brownie Weight:Previous weight 197lbs      Heart Failure questions reviewed, pt asymptomatic.   Thoracic impedance normal but was abnormal suggesting fluid accumulation from 01/30/2018 - 02/05/2018.   Prescribed: Furosemide 40 mg 1 to 2 tablets(40 to 80 mg total)daily as directed for edema, SOB and/or weightgain.She always takes Furosemide 40 mg 2 tablets (80 mg total) daily.  Labs: 11/28/2017 Creatinine 0.97, BUN 15, Potassium 3.7, Sodium 140, eGFR 57->60 09/26/2017 Creatinine0.95, BUN26, Potassium3.9, Sodium138, EGFR59->60 09/25/2017 Creatinine0.92, BUN28, Potassium4.2, Sodium136, EGFR>60  09/24/2017 Creatinine1.06, BUN27, Potassium3.7, Sodium132, EGFR52-60  09/23/2017 Creatinine0.85, BUN18, Potassium4.0, Sodium136, EGFR>60 07/27/2017 Creatinine 0.92, BUN 16, Potassium 3.5, Sodium 137, EGFR >60  Recommendations: No changes.   Encouraged to call for fluid symptoms.  Follow-up plan: ICM clinic phone appointment on 03/01/2018.   Office appointment scheduled 03/14/2018 with Dr. Irish Lack.    Copy of ICM check sent to Dr. Lovena Le.   3 month ICM trend: 02/06/2018    1 Year ICM trend:       [name], RN 02/07/2018 8:59 AM

## 2018-02-11 DIAGNOSIS — I509 Heart failure, unspecified: Secondary | ICD-10-CM | POA: Diagnosis not present

## 2018-02-11 DIAGNOSIS — R0602 Shortness of breath: Secondary | ICD-10-CM | POA: Diagnosis not present

## 2018-02-11 DIAGNOSIS — J4530 Mild persistent asthma, uncomplicated: Secondary | ICD-10-CM | POA: Diagnosis not present

## 2018-02-13 DIAGNOSIS — Z9889 Other specified postprocedural states: Secondary | ICD-10-CM | POA: Diagnosis not present

## 2018-03-01 ENCOUNTER — Ambulatory Visit: Payer: Medicare HMO

## 2018-03-01 DIAGNOSIS — I5022 Chronic systolic (congestive) heart failure: Secondary | ICD-10-CM

## 2018-03-01 DIAGNOSIS — Z95810 Presence of automatic (implantable) cardiac defibrillator: Secondary | ICD-10-CM

## 2018-03-01 NOTE — Progress Notes (Signed)
EPIC Encounter for ICM Monitoring  Patient Name: Paula Booker is a 72 y.o. female Date: 03/01/2018 Primary Care Physican: Thacker, Robert, MD Primary Cardiologist:Varanasi Electrophysiologist: Taylor Last Weight: 197lbs       Heart Failure questions reviewed, pt asymptomatic.   Thoracic impedance normal.   Prescribed: Furosemide 40 mg 1 to 2 tablets(40 to 80 mg total)daily as directed for edema, SOB and/or weightgain.She always takes Furosemide 40 mg 2 tablets (80 mg total) daily.  Labs: 11/28/2017 Creatinine 0.97, BUN 15, Potassium 3.7, Sodium 140, eGFR 57->60 09/26/2017 Creatinine0.95, BUN26, Potassium3.9, Sodium138, EGFR59->60 09/25/2017 Creatinine0.92, BUN28, Potassium4.2, Sodium136, EGFR>60  09/24/2017 Creatinine1.06, BUN27, Potassium3.7, Sodium132, EGFR52-60  09/23/2017 Creatinine0.85, BUN18, Potassium4.0, Sodium136, EGFR>60 07/27/2017 Creatinine 0.92, BUN 16, Potassium 3.5, Sodium 137, EGFR >60  Recommendations: No changes.   Encouraged to call for fluid symptoms.  Follow-up plan: ICM clinic phone appointment on 04/02/2018.   Office appointment scheduled 03/14/2018 with Dr. Varanasi.    Copy of ICM check sent to Dr. Taylor.   3 month ICM trend: 03/01/2018    1 Year ICM trend:        S , RN 03/01/2018 2:58 PM   

## 2018-03-13 DIAGNOSIS — I509 Heart failure, unspecified: Secondary | ICD-10-CM | POA: Diagnosis not present

## 2018-03-13 DIAGNOSIS — J4530 Mild persistent asthma, uncomplicated: Secondary | ICD-10-CM | POA: Diagnosis not present

## 2018-03-13 DIAGNOSIS — R0602 Shortness of breath: Secondary | ICD-10-CM | POA: Diagnosis not present

## 2018-03-14 ENCOUNTER — Encounter: Payer: Self-pay | Admitting: Interventional Cardiology

## 2018-03-14 ENCOUNTER — Ambulatory Visit (INDEPENDENT_AMBULATORY_CARE_PROVIDER_SITE_OTHER): Payer: Medicare HMO | Admitting: Interventional Cardiology

## 2018-03-14 VITALS — BP 124/72 | HR 84 | Ht 63.5 in | Wt 207.4 lb

## 2018-03-14 DIAGNOSIS — R600 Localized edema: Secondary | ICD-10-CM

## 2018-03-14 DIAGNOSIS — I10 Essential (primary) hypertension: Secondary | ICD-10-CM

## 2018-03-14 DIAGNOSIS — I5022 Chronic systolic (congestive) heart failure: Secondary | ICD-10-CM | POA: Diagnosis not present

## 2018-03-14 DIAGNOSIS — R5383 Other fatigue: Secondary | ICD-10-CM

## 2018-03-14 DIAGNOSIS — I428 Other cardiomyopathies: Secondary | ICD-10-CM | POA: Diagnosis not present

## 2018-03-14 DIAGNOSIS — R400 Somnolence: Secondary | ICD-10-CM

## 2018-03-14 DIAGNOSIS — Z95810 Presence of automatic (implantable) cardiac defibrillator: Secondary | ICD-10-CM | POA: Diagnosis not present

## 2018-03-14 NOTE — Progress Notes (Signed)
Cardiology Office Note   Date:  03/14/2018   ID:  Paula Booker, DOB 10-18-1945, MRN [id number]  PCP:  Paula Dials, MD    No chief complaint on file.  NICM  Wt Readings from Last 3 Encounters:  03/14/18 207 lb 6.4 oz (94.1 kg)  01/18/18 203 lb 12.8 oz (92.4 kg)  11/29/17 200 lb (90.7 kg)       History of Present Illness: Paula Booker is a 72 y.o. female  who has a nonischemic cardiomyopathy. She has had WPW diagnosed on ECG as well by Dr. Lovena Le. She has had ICD implant and EP study for WPW with no inducible arrhythmia.  Hospitalized in Dec 2016 for sepsis, UTI, pneumonia. SHe was in the hospital for about a week.   She has routine EP f/u on AICD.  She is taking her Lasix regularly.  If she skips it, she gets a call about fluid retention.  Since the last visit, she had an asthma attack.  She was treated with steroids and nebulizer.  She feels like she had not recovered from that.    Still tired.  She had been more active a few months ago,until the asthma attack.   Denies : Chest pain. Dizziness. Leg edema. Nitroglycerin use. Orthopnea. Palpitations. Paroxysmal nocturnal dyspnea. Syncope.   She snores.  She falls asleep easily during the day.     Past Medical History:  Diagnosis Date  . Anemia    as a child  . Anxiety   . Arthritis    "hands and knees" (03/22/2012)  . Asthma   . CHF (congestive heart failure) ([place]) 6/13  . Depression   . GERD (gastroesophageal reflux disease)   . Head injury, acute, with loss of consciousness ([place])   . Hypertension   . ICD (implantable cardiac defibrillator) in place Dec 2013  . Migraines    migraines   . NICM (nonischemic cardiomyopathy) ([place])   . Obesity (BMI 30-39.9)    negative sleep study 2014  . Pacemaker   . Pneumonia    hx  . Shortness of breath    "@ any time I can get SOB" (03/22/2012)  . Skin cancer 1999  . WPW (Wolff-Parkinson-White syndrome)     Past Surgical History:  Procedure Laterality  Date  . CARDIAC CATHETERIZATION  09/18/11   no significant CAD  . CARDIAC DEFIBRILLATOR PLACEMENT  03/22/2012   DDD  . CHOLECYSTECTOMY  ~ 2003  . IMPLANTABLE CARDIOVERTER DEFIBRILLATOR IMPLANT N/A 03/22/2012   Procedure: IMPLANTABLE CARDIOVERTER DEFIBRILLATOR IMPLANT;  Surgeon: Evans Lance, MD;  [facility] C[facility] LAB;  Service: Cardiovascular;  Laterality: N/A;  . KNEE ARTHROSCOPY Left 11/29/2017   Procedure: ARTHROSCOPY LEFT KNEE;  Surgeon: Dorna Leitz, MD;  Location: [facility] ORS;  Service: Orthopedics;  Laterality: Left;  . KYPHOPLASTY N/A 11/27/2013   Procedure: KYPHOPLASTY;  Surgeon: Sinclair Ship, MD;  Location: [place];  Service: Orthopedics;  Laterality: N/A;  T9, T11 kyphoplasty  . KYPHOPLASTY N/A 06/05/2014   Procedure: KYPHOPLASTY;  Surgeon: Sinclair Ship, MD;  Location: [facility];  Service: Orthopedics;  Laterality: N/A;  T7 kyphoplasty  . MOHS SURGERY  06/2011   "forehead" (03/22/2012)  . SKIN CANCER EXCISION  1999   "top of my head and my right back shoulder"  . SKIN CANCER EXCISION     back  . SKIN CANCER EXCISION     face  . SUPRAVENTRICULAR TACHYCARDIA ABLATION  03/22/2012   unable to induce VT/RN report 03/22/2012  .  SUPRAVENTRICULAR TACHYCARDIA ABLATION N/A 03/22/2012   Procedure: SUPRAVENTRICULAR TACHYCARDIA ABLATION;  Surgeon: Evans Lance, MD;  Location: [facility] CATH LAB;  Service: Cardiovascular;  Laterality: N/A;  . TONSILLECTOMY AND ADENOIDECTOMY  1950  . TUBAL LIGATION  1978     Current Outpatient Medications  Medication Sig Dispense Refill  . albuterol (PROVENTIL HFA;VENTOLIN HFA) 108 (90 Base) MCG/ACT inhaler Inhale 2 puffs into the lungs every 6 (six) hours as needed for wheezing. For wheezing. Use 2 puffs 3 times daily x5 days and then every 6 hours as needed. 1 Inhaler 5  . albuterol (PROVENTIL HFA;VENTOLIN HFA) 108 (90 Base) MCG/ACT inhaler Inhale 2 puffs into the lungs every 6 (six) hours as needed for wheezing. 1 Inhaler 5  . alendronate (FOSAMAX)  70 MG tablet Take 70 mg by mouth every Sunday.     . carvedilol (COREG) 12.5 MG tablet TAKE 1 TABLET BY MOUTH TWICE DAILY WITH A MEAL (Patient taking differently: Take 12.5 mg by mouth 2 (two) times daily. ) 180 tablet 2  . cetirizine (ZYRTEC) 10 MG tablet Take 10 mg by mouth daily.    . Cholecalciferol (VITAMIN D3) 5000 units CAPS Take 5,000 Units by mouth daily.    . citalopram (CELEXA) 20 MG tablet Take 20 mg by mouth daily.     . Cyanocobalamin (VITAMIN B-12) 5000 MCG LOZG Take 5,000 mcg by mouth daily.    . fluticasone (FLONASE) 50 MCG/ACT nasal spray Place 2 sprays into both nostrils daily.    . fluticasone furoate-vilanterol (BREO ELLIPTA) 200-25 MCG/INH AEPB Inhale 1 puff into the lungs daily. 60 each 6  . furosemide (LASIX) 40 MG tablet TAKE 1 TO 2 TABLETS BY MOUTH DAILY AS DIRECTED FOR EDEMA, SHORTNESS OF BREATH AND/OR WEIGHT GAIN (Patient taking differently: Take 80 mg by mouth daily. ) 180 tablet 3  . ipratropium-albuterol (DUONEB) 0.5-2.5 (3) MG/3ML SOLN Take 3 mLs by nebulization every 4 (four) hours as needed. (Patient taking differently: Take 3 mLs by nebulization every 4 (four) hours as needed (shortness of breath). ) 360 mL 1  . losartan (COZAAR) 25 MG tablet TAKE 1 TABLET BY MOUTH TWICE DAILY (Patient taking differently: Take 25 mg by mouth 2 (two) times daily. ) 180 tablet 3  . pantoprazole (PROTONIX) 40 MG tablet Take 40 mg by mouth daily.  0  . spironolactone (ALDACTONE) 25 MG tablet TAKE 1 TABLET BY MOUTH ONCE DAILY 90 tablet 0   No current facility-administered medications for this visit.     Allergies:   Ivp dye [iodinated diagnostic agents]; Penicillins; Sulfonamide derivatives; Tessalon [benzonatate]; Sulfa antibiotics; Rosuvastatin calcium; and Onion    Social History:  The patient  reports that she has never smoked. She has never used smokeless tobacco. She reports that she does not drink alcohol or use drugs.   Family History:  The patient's family history includes  Atrial fibrillation in her brother and mother; Congestive Heart Failure in her brother, father, and mother; Deafness in her father and mother; Healthy in her sister, sister, and sister; Kidney Stones in her mother; Tuberculosis in her paternal grandfather.    ROS:  Please see the history of present illness.   Otherwise, review of systems are positive for fatigue.   All other systems are reviewed and negative.    PHYSICAL EXAM: VS:  BP 124/72   Pulse 84   Ht 5' 3.5" (1.613 m)   Wt 207 lb 6.4 oz (94.1 kg)   SpO2 95%   BMI 36.16 kg/m  ,  BMI Body mass index is 36.16 kg/m. GEN: Well nourished, well developed, in no acute distress  HEENT: normal  Neck: no JVD, carotid bruits, or masses Cardiac: RRR; no murmurs, rubs, or gallops,no edema  Respiratory:  clear to auscultation bilaterally, normal work of breathing GI: soft, nontender, nondistended, + BS, obese MS: no deformity or atrophy  Skin: warm and dry, no rash Neuro:  Strength and sensation are intact Psych: euthymic mood, full affect    Recent Labs: 09/23/2017: Magnesium 1.9 11/28/2017: BUN 15; Creatinine, Ser 0.97; Hemoglobin 13.2; Platelets 256; Potassium 3.7; Sodium 140   Lipid Panel No results found for: CHOL, TRIG, HDL, CHOLHDL, VLDL, LDLCALC, LDLDIRECT   Other studies Reviewed: Additional studies/ records that were reviewed today with results demonstrating: labs reviewed.   ASSESSMENT AND PLAN:  1. Chronic systolic heart failure/NICM: No sign of volume overload.  Fatigue.  Check sleep study given daytime somnolence.  Continue meds for CHF.  She has been tolerating her medicines.  Device monitors volume. 2. HTN: The current medical regimen is effective;  continue present plan and medications. 3. Leg edema: Controlled.  Elevate legs.  Minimize salt intake.  4. Parking pass filled out. 5. AICD: followed by EP.   Current medicines are reviewed at length with the patient today.  The patient concerns regarding her medicines  were addressed.  The following changes have been made:  No change  Labs/ tests ordered today include:  No orders of the defined types were placed in this encounter.   Recommend 150 minutes/week of aerobic exercise Low fat, low carb, high fiber diet recommended  Disposition:   FU in 1 year   Signed, Larae Grooms, MD  03/14/2018 3:39 PM    [facility] [place], [place], [place]  [address] Phone: [phone]; Fax: [phone]

## 2018-03-14 NOTE — Patient Instructions (Addendum)
Medication Instructions:  Your physician recommends that you continue on your current medications as directed. Please refer to the Current Medication list given to you today.  If you need a refill on your cardiac medications before your next appointment, please call your pharmacy.   Lab work: None Ordered  If you have labs (blood work) drawn today and your tests are completely normal, you will receive your results only by: Marland Kitchen MyChart Message (if you have MyChart) OR . A paper copy in the mail If you have any lab test that is abnormal or we need to change your treatment, we will call you to review the results.  Testing/Procedures: Your physician has recommended that you have a sleep study. This test records several body functions during sleep, including: brain activity, eye movement, oxygen and carbon dioxide blood levels, heart rate and rhythm, breathing rate and rhythm, the flow of air through your mouth and nose, snoring, body muscle movements, and chest and belly movement.  Follow-Up: At [facility], you and your health needs are our priority.  As part of our continuing mission to provide you with exceptional heart care, we have created designated Provider Care Teams.  These Care Teams include your primary Cardiologist (physician) and Advanced Practice Providers (APPs -  Physician Assistants and Nurse Practitioners) who all work together to provide you with the care you need, when you need it. . You will need a follow up appointment in 1 year.  Please call our office 2 months in advance to schedule this appointment.  You may see Casandra Doffing, MD or one of the following Advanced Practice Providers on your designated Care Team:   . Lyda Jester, PA-C . Dayna Dunn, PA-C . Ermalinda Barrios, PA-C  Any Other Special Instructions Will Be Listed Below (If Applicable).

## 2018-03-15 ENCOUNTER — Telehealth: Payer: Self-pay | Admitting: *Deleted

## 2018-03-15 NOTE — Telephone Encounter (Signed)
-----   Message from Cleon Gustin, [place] sent at 03/15/2018  2:21 PM EST ----- Regarding: Sleep Study Patient needs Sleep Study for daytime somnolence and fatigue. Please pre-cert and schedule patient. Thanks

## 2018-03-15 NOTE — Telephone Encounter (Signed)
-----   Message from Cleon Gustin, [place] sent at 03/15/2018  2:21 PM EST ----- Regarding: Sleep Study Patient needs Sleep Study for daytime somnolence and fatigue. Please pre-cert and schedule patient. Thanks

## 2018-03-15 NOTE — Telephone Encounter (Signed)
PA submitted to [employer] for sleep study via web portal. 

## 2018-03-19 ENCOUNTER — Telehealth: Payer: Self-pay | Admitting: *Deleted

## 2018-03-19 DIAGNOSIS — M1711 Unilateral primary osteoarthritis, right knee: Secondary | ICD-10-CM | POA: Diagnosis not present

## 2018-03-19 DIAGNOSIS — M1710 Unilateral primary osteoarthritis, unspecified knee: Secondary | ICD-10-CM | POA: Diagnosis not present

## 2018-03-19 DIAGNOSIS — M170 Bilateral primary osteoarthritis of knee: Secondary | ICD-10-CM | POA: Diagnosis not present

## 2018-03-19 DIAGNOSIS — M25562 Pain in left knee: Secondary | ICD-10-CM | POA: Diagnosis not present

## 2018-03-19 DIAGNOSIS — M1712 Unilateral primary osteoarthritis, left knee: Secondary | ICD-10-CM | POA: Diagnosis not present

## 2018-03-19 DIAGNOSIS — M25561 Pain in right knee: Secondary | ICD-10-CM | POA: Diagnosis not present

## 2018-03-19 NOTE — Telephone Encounter (Signed)
-----   Message from Cleon Gustin, [place] sent at 03/15/2018  2:21 PM EST ----- Regarding: Sleep Study Patient needs Sleep Study for daytime somnolence and fatigue. Please pre-cert and schedule patient. Thanks

## 2018-03-19 NOTE — Telephone Encounter (Signed)
Staff message sent to [place]. [employer] Auth received. Ok to schedule sleep study. Auth # [id number]. Valid dates 04/09/18 to 05/09/18.

## 2018-03-22 NOTE — Telephone Encounter (Signed)
Patient is scheduled for lab study on 05/09/18. Patient understands her sleep study will be done at [facility] sleep lab. Patient understands she will receive a sleep packet in a week or so. Patient understands to call if she does not receive the sleep packet in a timely manner.  Left detailed message on voicemail with date and time of titration and informed patient to call back to confirm or reschedule.

## 2018-03-22 NOTE — Telephone Encounter (Signed)
  Lauralee Evener, CMA  Freada Bergeron, [place] Auth received. Auth # [id number]. Valid dates 04/09/18 to 05/09/18.

## 2018-03-26 ENCOUNTER — Other Ambulatory Visit: Payer: Self-pay | Admitting: Internal Medicine

## 2018-03-26 DIAGNOSIS — I428 Other cardiomyopathies: Secondary | ICD-10-CM

## 2018-04-02 ENCOUNTER — Ambulatory Visit (INDEPENDENT_AMBULATORY_CARE_PROVIDER_SITE_OTHER): Payer: Medicare HMO

## 2018-04-02 DIAGNOSIS — I428 Other cardiomyopathies: Secondary | ICD-10-CM

## 2018-04-02 DIAGNOSIS — I5022 Chronic systolic (congestive) heart failure: Secondary | ICD-10-CM

## 2018-04-02 DIAGNOSIS — Z95810 Presence of automatic (implantable) cardiac defibrillator: Secondary | ICD-10-CM

## 2018-04-02 LAB — CUP PACEART REMOTE DEVICE CHECK
Battery Remaining Longevity: 50 mo
Battery Voltage: 2.98 V
Brady Statistic AS VS Percent: 69.43 %
Brady Statistic RV Percent Paced: 0.06 %
Date Time Interrogation Session: 20191223051607
HIGH POWER IMPEDANCE MEASURED VALUE: 87 Ohm
Implantable Lead Implant Date: 20131212
Implantable Lead Location: 753860
Implantable Pulse Generator Implant Date: 20131212
Lead Channel Impedance Value: 646 Ohm
Lead Channel Impedance Value: 817 Ohm
Lead Channel Pacing Threshold Amplitude: 0.375 V
Lead Channel Pacing Threshold Pulse Width: 0.4 ms
Lead Channel Pacing Threshold Pulse Width: 0.4 ms
Lead Channel Setting Sensing Sensitivity: 0.3 mV
MDC IDC LEAD IMPLANT DT: 20131212
MDC IDC LEAD LOCATION: 753859
MDC IDC MSMT LEADCHNL RA IMPEDANCE VALUE: 513 Ohm
MDC IDC MSMT LEADCHNL RA SENSING INTR AMPL: 4.125 mV
MDC IDC MSMT LEADCHNL RA SENSING INTR AMPL: 4.125 mV
MDC IDC MSMT LEADCHNL RV PACING THRESHOLD AMPLITUDE: 0.5 V
MDC IDC MSMT LEADCHNL RV SENSING INTR AMPL: 7.75 mV
MDC IDC MSMT LEADCHNL RV SENSING INTR AMPL: 7.75 mV
MDC IDC SET LEADCHNL RA PACING AMPLITUDE: 2 V
MDC IDC SET LEADCHNL RV PACING AMPLITUDE: 2.5 V
MDC IDC SET LEADCHNL RV PACING PULSEWIDTH: 0.4 ms
MDC IDC STAT BRADY AP VP PERCENT: 0.03 %
MDC IDC STAT BRADY AP VS PERCENT: 30.51 %
MDC IDC STAT BRADY AS VP PERCENT: 0.03 %
MDC IDC STAT BRADY RA PERCENT PACED: 28.13 %

## 2018-04-02 NOTE — Progress Notes (Signed)
Remote ICD transmission.   

## 2018-04-02 NOTE — Progress Notes (Signed)
EPIC Encounter for ICM Monitoring  Patient Name: Paula Booker is a 72 y.o. female Date: 04/02/2018 Primary Care Physican: [name], MD Primary [place] Electrophysiologist: Lovena Le Last Weight: 197lbs                                                    Heart Failure questions reviewed, pt asymptomatic.   Thoracic impedance abnormal suggesting fluid accumulation since 02/28/2018 with exception of few days at baseline.   Prescribed: Furosemide 40 mg 1 to 2 tablets(40 to 80 mg total)daily as directed for edema, SOB and/or weightgain.She always takes Furosemide 40 mg 2 tablets (80 mg total) daily.  Labs: 11/28/2017 Creatinine 0.97, BUN 15, Potassium 3.7, Sodium 140, eGFR 57->60 09/26/2017 Creatinine0.95, BUN26, Potassium3.9, Sodium138, EGFR59->60 09/25/2017 Creatinine0.92, BUN28, Potassium4.2, Sodium136, EGFR>60  09/24/2017 Creatinine1.06, BUN27, Potassium3.7, Sodium132, EGFR52-60  09/23/2017 Creatinine0.85, BUN18, Potassium4.0, Sodium136, EGFR>60 07/27/2017 Creatinine 0.92, BUN 16, Potassium 3.5, Sodium 137, EGFR >60  Recommendations:  She always takes Furosemide 80 mg every morning and advised to take an addition tablet of 40 mg in the evening x 3 days and then return to 80 mg every morning.  She verbalized understanding.  Follow-up plan: ICM clinic phone appointment on 04/09/2018 to recheck fluid levels.  Office check scheduled 05/04/2018 with Dr Lovena Le.  Copy of ICM check sent to Dr. Lovena Le.    3 month ICM trend: 04/02/2018    1 Year ICM trend:       [name], RN 04/02/2018 11:59 AM

## 2018-04-09 ENCOUNTER — Other Ambulatory Visit: Payer: Self-pay | Admitting: Interventional Cardiology

## 2018-04-09 ENCOUNTER — Ambulatory Visit (INDEPENDENT_AMBULATORY_CARE_PROVIDER_SITE_OTHER): Payer: Medicare HMO

## 2018-04-09 DIAGNOSIS — I5022 Chronic systolic (congestive) heart failure: Secondary | ICD-10-CM

## 2018-04-09 DIAGNOSIS — Z95810 Presence of automatic (implantable) cardiac defibrillator: Secondary | ICD-10-CM

## 2018-04-09 NOTE — Progress Notes (Signed)
EPIC Encounter for ICM Monitoring  Patient Name: Paula Booker is a 72 y.o. female Date: 04/09/2018 Primary Care Physican: [name], MD Primary [place] Electrophysiologist: Lovena Le Last Weight:197lbs  Heart Failure questions reviewed, pt asymptomatic.  Thoracic impedance returned to normal after taking 3 days of extra Furosemide.   Prescribed:Furosemide 40 mg 1 to 2 tablets(40 to 80 mg total)daily as directed for edema, SOB and/or weightgain.She always takes Furosemide 40 mg 2 tablets (80 mg total) daily.  Labs: 11/28/2017 Creatinine 0.97, BUN 15, Potassium 3.7, Sodium 140, eGFR 57->60 09/26/2017 Creatinine0.95, BUN26, Potassium3.9, Sodium138, EGFR59->60 09/25/2017 Creatinine0.92, BUN28, Potassium4.2, Sodium136, EGFR>60  09/24/2017 Creatinine1.06, BUN27, Potassium3.7, Sodium132, EGFR52-60  09/23/2017 Creatinine0.85, BUN18, Potassium4.0, Sodium136, EGFR>60 07/27/2017 Creatinine 0.92, BUN 16, Potassium 3.5, Sodium 137, EGFR >60  Recommendations: No changes.  Follow-up plan: ICM clinic phone appointment on2/25/2020.  Office check scheduled 05/04/2018 with Dr Lovena Le.  Copy of ICM check sent to Dr.Taylor.    3 month ICM trend: 04/09/2018    1 Year ICM trend:       [name], RN 04/09/2018 11:25 AM

## 2018-04-13 DIAGNOSIS — I509 Heart failure, unspecified: Secondary | ICD-10-CM | POA: Diagnosis not present

## 2018-04-13 DIAGNOSIS — R0602 Shortness of breath: Secondary | ICD-10-CM | POA: Diagnosis not present

## 2018-04-13 DIAGNOSIS — J4530 Mild persistent asthma, uncomplicated: Secondary | ICD-10-CM | POA: Diagnosis not present

## 2018-04-14 DIAGNOSIS — J4530 Mild persistent asthma, uncomplicated: Secondary | ICD-10-CM | POA: Diagnosis not present

## 2018-04-25 ENCOUNTER — Other Ambulatory Visit: Payer: Self-pay | Admitting: Physician Assistant

## 2018-04-25 DIAGNOSIS — L820 Inflamed seborrheic keratosis: Secondary | ICD-10-CM | POA: Diagnosis not present

## 2018-04-25 DIAGNOSIS — D229 Melanocytic nevi, unspecified: Secondary | ICD-10-CM | POA: Diagnosis not present

## 2018-04-25 DIAGNOSIS — L570 Actinic keratosis: Secondary | ICD-10-CM | POA: Diagnosis not present

## 2018-04-25 DIAGNOSIS — D485 Neoplasm of uncertain behavior of skin: Secondary | ICD-10-CM | POA: Diagnosis not present

## 2018-04-25 DIAGNOSIS — M65342 Trigger finger, left ring finger: Secondary | ICD-10-CM | POA: Diagnosis not present

## 2018-04-25 DIAGNOSIS — M1811 Unilateral primary osteoarthritis of first carpometacarpal joint, right hand: Secondary | ICD-10-CM | POA: Diagnosis not present

## 2018-05-04 ENCOUNTER — Emergency Department (HOSPITAL_COMMUNITY): Payer: Medicare HMO

## 2018-05-04 ENCOUNTER — Encounter: Payer: Self-pay | Admitting: Internal Medicine

## 2018-05-04 ENCOUNTER — Emergency Department (HOSPITAL_COMMUNITY)
Admission: EM | Admit: 2018-05-04 | Discharge: 2018-05-04 | Disposition: A | Discharge status: Home / Self Care | Payer: Medicare HMO | Attending: Emergency Medicine | Admitting: Emergency Medicine

## 2018-05-04 ENCOUNTER — Encounter (HOSPITAL_COMMUNITY): Payer: Self-pay | Admitting: Internal Medicine

## 2018-05-04 ENCOUNTER — Ambulatory Visit (INDEPENDENT_AMBULATORY_CARE_PROVIDER_SITE_OTHER): Payer: Medicare HMO | Admitting: Internal Medicine

## 2018-05-04 VITALS — BP 130/82 | Ht 63.5 in | Wt 201.0 lb

## 2018-05-04 DIAGNOSIS — I5023 Acute on chronic systolic (congestive) heart failure: Secondary | ICD-10-CM | POA: Insufficient documentation

## 2018-05-04 DIAGNOSIS — I428 Other cardiomyopathies: Secondary | ICD-10-CM | POA: Diagnosis not present

## 2018-05-04 DIAGNOSIS — Z95810 Presence of automatic (implantable) cardiac defibrillator: Secondary | ICD-10-CM | POA: Diagnosis not present

## 2018-05-04 DIAGNOSIS — R531 Weakness: Secondary | ICD-10-CM

## 2018-05-04 DIAGNOSIS — I5022 Chronic systolic (congestive) heart failure: Secondary | ICD-10-CM | POA: Diagnosis not present

## 2018-05-04 DIAGNOSIS — R0602 Shortness of breath: Secondary | ICD-10-CM | POA: Diagnosis not present

## 2018-05-04 DIAGNOSIS — R51 Headache: Secondary | ICD-10-CM | POA: Diagnosis not present

## 2018-05-04 DIAGNOSIS — I447 Left bundle-branch block, unspecified: Secondary | ICD-10-CM | POA: Diagnosis not present

## 2018-05-04 DIAGNOSIS — I509 Heart failure, unspecified: Secondary | ICD-10-CM | POA: Diagnosis not present

## 2018-05-04 DIAGNOSIS — I110 Hypertensive heart disease with heart failure: Secondary | ICD-10-CM | POA: Diagnosis not present

## 2018-05-04 DIAGNOSIS — Z79899 Other long term (current) drug therapy: Secondary | ICD-10-CM | POA: Insufficient documentation

## 2018-05-04 DIAGNOSIS — R519 Headache, unspecified: Secondary | ICD-10-CM

## 2018-05-04 DIAGNOSIS — Z8679 Personal history of other diseases of the circulatory system: Secondary | ICD-10-CM

## 2018-05-04 DIAGNOSIS — S0990XA Unspecified injury of head, initial encounter: Secondary | ICD-10-CM | POA: Diagnosis not present

## 2018-05-04 LAB — COMPREHENSIVE METABOLIC PANEL
ALK PHOS: 71 U/L (ref 38–126)
ALT: 15 U/L (ref 0–44)
ANION GAP: 10 (ref 5–15)
AST: 20 U/L (ref 15–41)
Albumin: 3.7 g/dL (ref 3.5–5.0)
BILIRUBIN TOTAL: 0.9 mg/dL (ref 0.3–1.2)
BUN: 18 mg/dL (ref 8–23)
CALCIUM: 9.4 mg/dL (ref 8.9–10.3)
CO2: 24 mmol/L (ref 22–32)
CREATININE: 0.95 mg/dL (ref 0.44–1.00)
Chloride: 100 mmol/L (ref 98–111)
GFR, EST NON AFRICAN AMERICAN: 60 mL/min — AB (ref >60)
Glucose, Bld: 120 mg/dL — ABNORMAL HIGH (ref 70–99)
Potassium: 3.7 mmol/L (ref 3.5–5.1)
Sodium: 134 mmol/L — ABNORMAL LOW (ref 135–145)
TOTAL PROTEIN: 6.6 g/dL (ref 6.5–8.1)

## 2018-05-04 LAB — BRAIN NATRIURETIC PEPTIDE: B Natriuretic Peptide: 62.5 pg/mL (ref 0.0–100.0)

## 2018-05-04 LAB — CUP PACEART INCLINIC DEVICE CHECK
Battery Remaining Longevity: 44 mo
Battery Voltage: 2.97 V
Brady Statistic AP VP Percent: 0.04 %
Brady Statistic RA Percent Paced: 20.82 %
Brady Statistic RV Percent Paced: 0.06 %
HIGH POWER IMPEDANCE MEASURED VALUE: 101 Ohm
Implantable Lead Implant Date: 20131212
Implantable Lead Location: 753860
Implantable Lead Model: 5076
Implantable Lead Model: 6935
Implantable Pulse Generator Implant Date: 20131212
Lead Channel Impedance Value: 760 Ohm
Lead Channel Impedance Value: 874 Ohm
Lead Channel Pacing Threshold Amplitude: 0.5 V
Lead Channel Pacing Threshold Amplitude: 0.5 V
Lead Channel Sensing Intrinsic Amplitude: 4.375 mV
Lead Channel Setting Pacing Amplitude: 2 V
Lead Channel Setting Sensing Sensitivity: 0.3 mV
MDC IDC LEAD IMPLANT DT: 20131212
MDC IDC LEAD LOCATION: 753859
MDC IDC MSMT LEADCHNL RA IMPEDANCE VALUE: 532 Ohm
MDC IDC MSMT LEADCHNL RA PACING THRESHOLD PULSEWIDTH: 0.4 ms
MDC IDC MSMT LEADCHNL RV PACING THRESHOLD PULSEWIDTH: 0.4 ms
MDC IDC MSMT LEADCHNL RV SENSING INTR AMPL: 11.75 mV
MDC IDC SESS DTM: 20200124133621
MDC IDC SET LEADCHNL RV PACING AMPLITUDE: 2.5 V
MDC IDC SET LEADCHNL RV PACING PULSEWIDTH: 0.4 ms
MDC IDC STAT BRADY AP VS PERCENT: 21.98 %
MDC IDC STAT BRADY AS VP PERCENT: 0.03 %
MDC IDC STAT BRADY AS VS PERCENT: 77.95 %

## 2018-05-04 LAB — CBC
HCT: 42.4 % (ref 36.0–46.0)
HEMOGLOBIN: 14.3 g/dL (ref 12.0–15.0)
MCH: 30.5 pg (ref 26.0–34.0)
MCHC: 33.7 g/dL (ref 30.0–36.0)
MCV: 90.4 fL (ref 80.0–100.0)
NRBC: 0 % (ref 0.0–0.2)
Platelets: 247 10*3/uL (ref 150–400)
RBC: 4.69 MIL/uL (ref 3.87–5.11)
RDW: 12.2 % (ref 11.5–15.5)
WBC: 10.9 10*3/uL — AB (ref 4.0–10.5)

## 2018-05-04 LAB — URINALYSIS, ROUTINE W REFLEX MICROSCOPIC
Bacteria, UA: NONE SEEN
Bilirubin Urine: NEGATIVE
GLUCOSE, UA: NEGATIVE mg/dL
KETONES UR: NEGATIVE mg/dL
NITRITE: NEGATIVE
PH: 6 (ref 5.0–8.0)
Protein, ur: NEGATIVE mg/dL
SPECIFIC GRAVITY, URINE: 1.008 (ref 1.005–1.030)

## 2018-05-04 MED ORDER — ACETAMINOPHEN 500 MG PO TABS
1000.0000 mg | ORAL_TABLET | Freq: Once | ORAL | Status: AC
  Administered 2018-05-04: 1000 mg via ORAL
  Filled 2018-05-04: qty 2

## 2018-05-04 MED ORDER — SODIUM CHLORIDE 0.9 % IV SOLN: INTRAVENOUS | Status: DC

## 2018-05-04 NOTE — Discharge Instructions (Addendum)
It was our pleasure to provide your ER care today - we hope that you feel better.  Your scans and lab tests look good.  Rest. Drink adequate fluids. Take acetaminophen and/or ibuprofen as need.   Follow up with primary care doctor in the coming week.  Return to ER if worse, new symptoms, high fevers, new or severe pain, trouble breathing, other concern.

## 2018-05-04 NOTE — ED Notes (Signed)
Patient ambulatory to bathroom with steady gait at this time with 1 person assist

## 2018-05-04 NOTE — ED Notes (Signed)
Pt given discharge instructions. Esignature pad not available. Pt verbalized understanding of d/c instructions.

## 2018-05-04 NOTE — Progress Notes (Signed)
HPI Paula Booker returns today for followup. She is a pleasant73yo woman with a non-ischemic CM, chronic systolic heart failure, syncope and a WPW pattern on her ECG who underwent EP study with no inducible arrhythmias followed by ICD implant 6 years ago. Paula Booker become more sedentary. She denies any ICD shocks. She c/o severe and generalized weakness, dyspnea and left arm weakness. She denies dietary or medical non-compliance. She has not had anginal symptoms.  Allergies  Allergen Reactions  . Ivp Dye [Iodinated Diagnostic Agents] Hives and Other (See Comments)  . Penicillins Anaphylaxis    Has patient had a PCN reaction causing immediate rash, facial/tongue/throat swelling, SOB or lightheadedness with hypotension: No Has patient had a PCN reaction causing severe rash involving mucus membranes or skin necrosis: No Has patient had a PCN reaction that required hospitalization No Has patient had a PCN reaction occurring within the last 10 years:No If all of the above answers are "NO", then may proceed with Cephalosporin use.  . Sulfonamide Derivatives Other (See Comments)    Migraines  . Tessalon [Benzonatate] Rash and Hives    Severe rash  . Sulfa Antibiotics Other (See Comments)    Migraine  . Rosuvastatin Calcium Other (See Comments)    Unknown reaction  . Onion Other (See Comments)    Raw onions cause migraines     Current Outpatient Medications  Medication Sig Dispense Refill  . albuterol (PROVENTIL HFA;VENTOLIN HFA) 108 (90 Base) MCG/ACT inhaler Inhale 2 puffs into the lungs every 6 (six) hours as needed for wheezing. For wheezing. Use 2 puffs 3 times daily x5 days and then every 6 hours as needed. (Patient taking differently: Inhale 2 puffs into the lungs every 6 (six) hours as needed for wheezing. ) 1 Inhaler 5  . albuterol (PROVENTIL HFA;VENTOLIN HFA) 108 (90 Base) MCG/ACT inhaler Inhale 2 puffs into the lungs every 6 (six) hours as needed for wheezing. (Patient not  taking: Reported on 05/04/2018) 1 Inhaler 5  . alendronate (FOSAMAX) 70 MG tablet Take 70 mg by mouth every Sunday.     . carvedilol (COREG) 12.5 MG tablet TAKE 1 TABLET BY MOUTH TWICE DAILY WITH A MEAL (Patient taking differently: Take 12.5 mg by mouth 2 (two) times daily with a meal. ) 180 tablet 0  . cetirizine (ZYRTEC) 10 MG tablet Take 10 mg by mouth daily.    . Cholecalciferol (VITAMIN D3) 5000 units CAPS Take 5,000 Units by mouth daily.    . citalopram (CELEXA) 20 MG tablet Take 20 mg by mouth daily.     . Cyanocobalamin (VITAMIN B-12) 5000 MCG LOZG Take 5,000 mcg by mouth daily.    . fluticasone (FLONASE) 50 MCG/ACT nasal spray Place 2 sprays into both nostrils daily.    . fluticasone furoate-vilanterol (BREO ELLIPTA) 200-25 MCG/INH AEPB Inhale 1 puff into the lungs daily. 60 each 6  . furosemide (LASIX) 40 MG tablet TAKE 1 TO 2 TABLETS BY MOUTH DAILY AS DIRECTED FOR EDEMA, SHORTNESS OF BREATH AND/OR WEIGHT GAIN (Patient taking differently: Take 80 mg by mouth daily. ) 180 tablet 3  . ipratropium-albuterol (DUONEB) 0.5-2.5 (3) MG/3ML SOLN Take 3 mLs by nebulization every 4 (four) hours as needed. 360 mL 1  . losartan (COZAAR) 25 MG tablet TAKE 1 TABLET BY MOUTH TWICE DAILY (Patient taking differently: Take 25 mg by mouth 2 (two) times daily. ) 180 tablet 3  . pantoprazole (PROTONIX) 40 MG tablet Take 40 mg by mouth daily.  0  .  spironolactone (ALDACTONE) 25 MG tablet TAKE 1 TABLET BY MOUTH ONCE DAILY (Patient taking differently: Take 25 mg by mouth daily. ) 90 tablet 0   No current facility-administered medications for this visit.      Past Medical History:  Diagnosis Date  . Anemia    as a child  . Anxiety   . Arthritis    "hands and knees" (03/22/2012)  . Asthma   . CHF (congestive heart failure) ([place]) 6/13  . Depression   . GERD (gastroesophageal reflux disease)   . Head injury, acute, with loss of consciousness ([place])   . Hypertension   . ICD (implantable cardiac  defibrillator) in place Dec 2013  . Migraines    migraines   . NICM (nonischemic cardiomyopathy) ([place])   . Obesity (BMI 30-39.9)    negative sleep study 2014  . Pacemaker   . Pneumonia    hx  . Shortness of breath    "@ any time I can get SOB" (03/22/2012)  . Skin cancer 1999  . WPW (Wolff-Parkinson-White syndrome)     ROS:   All systems reviewed and negative except as noted in the HPI.   Past Surgical History:  Procedure Laterality Date  . CARDIAC CATHETERIZATION  09/18/11   no significant CAD  . CARDIAC DEFIBRILLATOR PLACEMENT  03/22/2012   DDD  . CHOLECYSTECTOMY  ~ 2003  . IMPLANTABLE CARDIOVERTER DEFIBRILLATOR IMPLANT N/A 03/22/2012   Procedure: IMPLANTABLE CARDIOVERTER DEFIBRILLATOR IMPLANT;  Surgeon: Paula Lance, MD;  Location[facility] C[facility] LAB;  Service: Cardiovascular;  Laterality: N/A;  . KNEE ARTHROSCOPY Left 11/29/2017   Procedure: ARTHROSCOPY LEFT KNEE;  Surgeon: Paula Leitz, MD;  Location: [facility] ORS;  Service: Orthopedics;  Laterality: Left;  . KYPHOPLASTY N/A 11/27/2013   Procedure: KYPHOPLASTY;  Surgeon: Paula Ship, MD;  Location: [place];  Service: Orthopedics;  Laterality: N/A;  T9, T11 kyphoplasty  . KYPHOPLASTY N/A 06/05/2014   Procedure: KYPHOPLASTY;  Surgeon: Paula Ship, MD;  Location: [place];  Service: Orthopedics;  Laterality: N/A;  T7 kyphoplasty  . MOHS SURGERY  06/2011   "forehead" (03/22/2012)  . SKIN CANCER EXCISION  1999   "top of my head and my right back shoulder"  . SKIN CANCER EXCISION     back  . SKIN CANCER EXCISION     face  . SUPRAVENTRICULAR TACHYCARDIA ABLATION  03/22/2012   unable to induce VT/RN report 03/22/2012  . SUPRAVENTRICULAR TACHYCARDIA ABLATION N/A 03/22/2012   Procedure: SUPRAVENTRICULAR TACHYCARDIA ABLATION;  Surgeon: Paula Lance, MD;  Location: [facility] CATH LAB;  Service: Cardiovascular;  Laterality: N/A;  . TONSILLECTOMY AND ADENOIDECTOMY  1950  . TUBAL LIGATION  1978     Family History  Problem Relation  Age of Onset  . Congestive Heart Failure Brother   . Atrial fibrillation Brother   . Congestive Heart Failure Mother        died 66  . Kidney Stones Mother   . Deafness Mother   . Atrial fibrillation Mother   . Congestive Heart Failure Father   . Deafness Father   . Healthy Sister   . Healthy Sister   . Healthy Sister   . Tuberculosis Paternal Grandfather      Social History   Socioeconomic History  . Marital status: Divorced    Spouse name: Not on file  . Number of children: 3  . Years of education: Not on file  . Highest education level: Not on file  Occupational History  . Occupation: retired  Comment: accountant  Social Needs  . Financial resource strain: Not on file  . Food insecurity:    Worry: Not on file    Inability: Not on file  . Transportation needs:    Medical: Not on file    Non-medical: Not on file  Tobacco Use  . Smoking status: Never Smoker  . Smokeless tobacco: Never Used  Substance and Sexual Activity  . Alcohol use: No  . Drug use: No  . Sexual activity: Not on file  Lifestyle  . Physical activity:    Days per week: Not on file    Minutes per session: Not on file  . Stress: Not on file  Relationships  . Social connections:    Talks on phone: Not on file    Gets together: Not on file    Attends religious service: Not on file    Active member of club or organization: Not on file    Attends meetings of clubs or organizations: Not on file    Relationship status: Not on file  . Intimate partner violence:    Fear of current or ex partner: Not on file    Emotionally abused: Not on file    Physically abused: Not on file    Forced sexual activity: Not on file  Other Topics Concern  . Not on file  Social History Narrative   Divorced, 3 children, 9 grandchildren     BP 130/82   Ht 5' 3.5" (1.613 m)   Wt 201 lb (91.2 kg)   SpO2 95%   BMI 35.05 kg/m   Physical Exam:  Well appearing 73 yo woman, NAD HEENT: Unremarkable Neck:  6 cm  JVD, no thyromegally Lymphatics:  No adenopathy Back:  No CVA tenderness Lungs:  Clear with no wheezes HEART:  Regular rate rhythm, no murmurs, no rubs, no clicks Abd:  soft, positive bowel sounds, no organomegally, no rebound, no guarding Ext:  2 plus pulses, no edema, no cyanosis, no clubbing Skin:  No rashes no nodules Neuro:  CN II through XII intact, motor grossly intact  EKG - NSR with LBBB  DEVICE  Normal device function.  See PaceArt for details.   Assess/Plan: 1. Generalized weakness/arm weakness/near syncope - there have been no arrhythmias on her device. Her vitals are stable. I am not convinced she has anything going on but I have recommended she seek medical attention.  2. Chronic systolic heart failure - she appears euvolemic and her optivol looks good. Her activity is at baseline.  3. ICD - her [employer]tr[employer] device demonstrates no ICD therapies.  Mikle Bosworth.D.

## 2018-05-04 NOTE — ED Provider Notes (Signed)
[facility] EMERGENCY DEPARTMENT Provider Note   CSN: [id number] Arrival date & time: 05/04/18  1127     History   Chief Complaint Chief Complaint  Patient presents with  . Weakness    HPI Aaren Krog is a 73 y.o. female.  Patient w hx chf, sent from cardiologists office to ED. Pt reports feeling generally weak for 1 week, and mild sob for 1 day. Patient notes symptoms gradual onset, constant, persistent. States cardiologist told her he thought her heart and breathing were fine but to go to ED. Pt notes on way to ED acute onset frontal headache, mod-sev, dull, non radiating. No loc. No eye pain or change in vision. No neck pain or stiffness. No fever or chills. Denies chest pain or discomfort. Denies cough or uri symptoms. No abd pain. No nvd. No gu c/o.   The history is provided by the patient.  Shortness of Breath  Associated symptoms: headaches   Associated symptoms: no abdominal pain, no chest pain, no cough, no fever, no neck pain, no rash, no sore throat and no vomiting     Past Medical History:  Diagnosis Date  . Anemia    as a child  . Anxiety   . Arthritis    "hands and knees" (03/22/2012)  . Asthma   . CHF (congestive heart failure) ([place]) 6/13  . Depression   . GERD (gastroesophageal reflux disease)   . Head injury, acute, with loss of consciousness ([place])   . Hypertension   . ICD (implantable cardiac defibrillator) in place Dec 2013  . Migraines    migraines   . NICM (nonischemic cardiomyopathy) ([place])   . Obesity (BMI 30-39.9)    negative sleep study 2014  . Pacemaker   . Pneumonia    hx  . Shortness of breath    "@ any time I can get SOB" (03/22/2012)  . Skin cancer 1999  . WPW (Wolff-Parkinson-White syndrome)     Patient Active Problem List   Diagnosis Date Noted  . Acute medial meniscus tear, left, initial encounter 11/29/2017  . Chondromalacia of left knee 11/29/2017  . Migraines 09/23/2017  . Anxiety 09/23/2017  .  Depression 09/23/2017  . Pacemaker 09/23/2017  . Chronic systolic heart failure ([place]) 04/28/2015  . UTI (urinary tract infection) 03/22/2015  . Sepsis ([place]) 03/19/2015  . Acute kidney injury ([place]) 03/19/2015  . Diarrhea 03/18/2015  . CAP (community acquired pneumonia) 03/18/2015  . Nausea vomiting and diarrhea 03/18/2015  . Compression fracture 06/05/2014  . Essential hypertension 03/27/2014  . Chest pain 01/27/2014  . Normal coronary arteries 01/27/2014  . Obesity (BMI 30-39.9)   . ICD- MDT implanted Dec 2013 03/26/2013  . NICM (nonischemic cardiomyopathy) ([place]) 02/21/2013  . GERD (gastroesophageal reflux disease) 04/23/2012  . Asthma in adult, mild persistent, uncomplicated 47/82/9562  . Shortness of breath 04/23/2012  . Syncope 03/01/2012  . Acute on chronic systolic CHF (congestive heart failure) ([place]) 03/01/2012  . Type B WPW syndrome- RFA Dec 2013 03/01/2012    Past Surgical History:  Procedure Laterality Date  . CARDIAC CATHETERIZATION  09/18/11   no significant CAD  . CARDIAC DEFIBRILLATOR PLACEMENT  03/22/2012   DDD  . CHOLECYSTECTOMY  ~ 2003  . IMPLANTABLE CARDIOVERTER DEFIBRILLATOR IMPLANT N/A 03/22/2012   Procedure: IMPLANTABLE CARDIOVERTER DEFIBRILLATOR IMPLANT;  Surgeon: Evans Lance, MD;  Location: [facility] CATH LAB;  Service: Cardiovascular;  Laterality: N/A;  . KNEE ARTHROSCOPY Left 11/29/2017   Procedure: ARTHROSCOPY LEFT KNEE;  Surgeon: Berenice Primas,  Jenny Reichmann, MD;  Location: [facility] ORS;  Service: Orthopedics;  Laterality: Left;  . KYPHOPLASTY N/A 11/27/2013   Procedure: KYPHOPLASTY;  Surgeon: Sinclair Ship, MD;  Location: [facility];  Service: Orthopedics;  Laterality: N/A;  T9, T11 kyphoplasty  . KYPHOPLASTY N/A 06/05/2014   Procedure: KYPHOPLASTY;  Surgeon: Sinclair Ship, MD;  Location: [facility];  Service: Orthopedics;  Laterality: N/A;  T7 kyphoplasty  . MOHS SURGERY  06/2011   "forehead" (03/22/2012)  . SKIN CANCER EXCISION  1999   "top of my head and my right back  shoulder"  . SKIN CANCER EXCISION     back  . SKIN CANCER EXCISION     face  . SUPRAVENTRICULAR TACHYCARDIA ABLATION  03/22/2012   unable to induce VT/RN report 03/22/2012  . SUPRAVENTRICULAR TACHYCARDIA ABLATION N/A 03/22/2012   Procedure: SUPRAVENTRICULAR TACHYCARDIA ABLATION;  Surgeon: Evans Lance, MD;  Location: [facility] CATH LAB;  Service: Cardiovascular;  Laterality: N/A;  . TONSILLECTOMY AND ADENOIDECTOMY  1950  . TUBAL LIGATION  1978     OB History   No obstetric history on file.      Home Medications    Prior to Admission medications   Medication Sig Start Date End Date Taking? Authorizing Provider  albuterol (PROVENTIL HFA;VENTOLIN HFA) 108 (90 Base) MCG/ACT inhaler Inhale 2 puffs into the lungs every 6 (six) hours as needed for wheezing. For wheezing. Use 2 puffs 3 times daily x5 days and then every 6 hours as needed. 01/18/18   Mannam, Hart Robinsons, MD  albuterol (PROVENTIL HFA;VENTOLIN HFA) 108 (90 Base) MCG/ACT inhaler Inhale 2 puffs into the lungs every 6 (six) hours as needed for wheezing. 01/18/18   Mannam, Hart Robinsons, MD  alendronate (FOSAMAX) 70 MG tablet Take 70 mg by mouth every Sunday.     [provider]  carvedilol (COREG) 12.5 MG tablet TAKE 1 TABLET BY MOUTH TWICE DAILY WITH A MEAL 03/27/18   Evans Lance, MD  cetirizine (ZYRTEC) 10 MG tablet Take 10 mg by mouth daily.    [provider]  Cholecalciferol (VITAMIN D3) 5000 units CAPS Take 5,000 Units by mouth daily.    [provider]  citalopram (CELEXA) 20 MG tablet Take 20 mg by mouth daily.     [provider]  Cyanocobalamin (VITAMIN B-12) 5000 MCG LOZG Take 5,000 mcg by mouth daily.    [provider]  fluticasone (FLONASE) 50 MCG/ACT nasal spray Place 2 sprays into both nostrils daily.    [provider]  fluticasone furoate-vilanterol (BREO ELLIPTA) 200-25 MCG/INH AEPB Inhale 1 puff into the lungs daily. 01/18/18   Mannam, Hart Robinsons, MD  furosemide (LASIX)  40 MG tablet TAKE 1 TO 2 TABLETS BY MOUTH DAILY AS DIRECTED FOR EDEMA, SHORTNESS OF BREATH AND/OR WEIGHT GAIN Patient taking differently: Take 80 mg by mouth daily.  05/16/17   Jettie Booze, MD  ipratropium-albuterol (DUONEB) 0.5-2.5 (3) MG/3ML SOLN Take 3 mLs by nebulization every 4 (four) hours as needed. Patient taking differently: Take 3 mLs by nebulization every 4 (four) hours as needed (shortness of breath).  10/09/17   Lauraine Rinne, NP  losartan (COZAAR) 25 MG tablet TAKE 1 TABLET BY MOUTH TWICE DAILY 04/09/18   Jettie Booze, MD  pantoprazole (PROTONIX) 40 MG tablet Take 40 mg by mouth daily. 08/29/17   [provider]  spironolactone (ALDACTONE) 25 MG tablet TAKE 1 TABLET BY MOUTH ONCE DAILY 02/05/18   Jettie Booze, MD    Family History Family History  Problem Relation Age of Onset  . Congestive Heart Failure Brother   . Atrial fibrillation Brother   . Congestive Heart Failure Mother        died 42  . Kidney Stones Mother   . Deafness Mother   . Atrial fibrillation Mother   . Congestive Heart Failure Father   . Deafness Father   . Healthy Sister   . Healthy Sister   . Healthy Sister   . Tuberculosis Paternal Grandfather     Social History Social History   Tobacco Use  . Smoking status: Never Smoker  . Smokeless tobacco: Never Used  Substance Use Topics  . Alcohol use: No  . Drug use: No     Allergies   Ivp dye [iodinated diagnostic agents]; Penicillins; Sulfonamide derivatives; Tessalon [benzonatate]; Sulfa antibiotics; Rosuvastatin calcium; and Onion   Review of Systems Review of Systems  Constitutional: Negative for fever.  HENT: Negative for sinus pain and sore throat.   Eyes: Negative for redness.  Respiratory: Positive for shortness of breath. Negative for cough.   Cardiovascular: Negative for chest pain.  Gastrointestinal: Negative for abdominal pain, diarrhea and vomiting.  Endocrine: Negative for polyuria.    Genitourinary: Negative for dysuria and flank pain.  Musculoskeletal: Negative for back pain and neck pain.  Skin: Negative for rash.  Neurological: Positive for headaches. Negative for speech difficulty and numbness.  Hematological: Does not bruise/bleed easily.  Psychiatric/Behavioral: The patient is nervous/anxious.      Physical Exam Updated Vital Signs There were no vitals taken for this visit.  Physical Exam Vitals signs and nursing note reviewed.  Constitutional:      General: She is not in acute distress.    Appearance: Normal appearance. She is well-developed. She is not diaphoretic.  HENT:     Head: Atraumatic.     Nose: Nose normal.     Mouth/Throat:     Mouth: Mucous membranes are moist.  Eyes:     General: No scleral icterus.    Conjunctiva/sclera: Conjunctivae normal.     Pupils: Pupils are equal, round, and reactive to light.  Neck:     Musculoskeletal: Normal range of motion and neck supple. No neck rigidity or muscular tenderness.     Thyroid: No thyromegaly.     Trachea: No tracheal deviation.     Comments: No stiffness or rigidity.  Cardiovascular:     Rate and Rhythm: Normal rate and regular rhythm.     Pulses: Normal pulses.     Heart sounds: Normal heart sounds. No murmur. No friction rub. No gallop.   Pulmonary:     Effort: Pulmonary effort is normal. No respiratory distress.     Breath sounds: Normal breath sounds.  Abdominal:     General: Bowel sounds are normal. There is no distension.     Palpations: Abdomen is soft.     Tenderness: There is no abdominal tenderness. There is no guarding.  Genitourinary:    Comments: No cva tenderness.  Musculoskeletal: Normal range of motion.        General: No swelling or tenderness.  Skin:    General: Skin is warm and dry.     Findings: No rash.  Neurological:     Mental Status: She is alert and oriented to person, place, and time.     Cranial Nerves: No cranial nerve deficit.     Comments: Alert,  speech normal. Motor intact bil, stre 5/5. sens grossly intact.   Psychiatric:  Comments: Patient very anxious appearing.       ED Treatments / Results  Labs (all labs ordered are listed, but only abnormal results are displayed) Results for orders placed or performed during the hospital encounter of 05/04/18  CBC  Result Value Ref Range   WBC 10.9 (H) 4.0 - 10.5 K/uL   RBC 4.69 3.87 - 5.11 MIL/uL   Hemoglobin 14.3 12.0 - 15.0 g/dL   HCT 42.4 36.0 - 46.0 %   MCV 90.4 80.0 - 100.0 fL   MCH 30.5 26.0 - 34.0 pg   MCHC 33.7 30.0 - 36.0 g/dL   RDW 12.2 11.5 - 15.5 %   Platelets 247 150 - 400 K/uL   nRBC 0.0 0.0 - 0.2 %  Comprehensive metabolic panel  Result Value Ref Range   Sodium 134 (L) 135 - 145 mmol/L   Potassium 3.7 3.5 - 5.1 mmol/L   Chloride 100 98 - 111 mmol/L   CO2 24 22 - 32 mmol/L   Glucose, Bld 120 (H) 70 - 99 mg/dL   BUN 18 8 - 23 mg/dL   Creatinine, Ser 0.95 0.44 - 1.00 mg/dL   Calcium 9.4 8.9 - 10.3 mg/dL   Total Protein 6.6 6.5 - 8.1 g/dL   Albumin 3.7 3.5 - 5.0 g/dL   AST 20 15 - 41 U/L   ALT 15 0 - 44 U/L   Alkaline Phosphatase 71 38 - 126 U/L   Total Bilirubin 0.9 0.3 - 1.2 mg/dL   GFR calc non Af Amer 60 (L) >60 mL/min   GFR calc Af Amer >60 >60 mL/min   Anion gap 10 5 - 15  Brain natriuretic peptide  Result Value Ref Range   B Natriuretic Peptide 62.5 0.0 - 100.0 pg/mL   Ct Head Wo Contrast  Result Date: 05/04/2018 CLINICAL DATA:  Headache.  Recent fall EXAM: CT HEAD WITHOUT CONTRAST TECHNIQUE: Contiguous axial images were obtained from the base of the skull through the vertex without intravenous contrast. COMPARISON:  November 08, 2013 FINDINGS: Brain: The ventricles are normal in size and configuration. There is no intracranial mass, hemorrhage, extra-axial fluid collection, or midline shift. The brain parenchyma appears unremarkable. No evident acute infarct. Vascular: There is no hyperdense vessel. There is calcification in each carotid siphon  region. Skull: The bony calvarium appears intact. Sinuses/Orbits: There is mucosal thickening in the lateral right maxillary antrum. There is mucosal thickening in several ethmoid air cells. Orbits appear symmetric bilaterally. Other: Mastoid air cells are clear. IMPRESSION: Foci of arterial vascular calcification noted. There is mucosal thickening in several paranasal sinuses. Study otherwise unremarkable. Electronically Signed   By: Lowella Grip III M.D.   On: 05/04/2018 12:56   Dg Chest Port 1 View  Result Date: 05/04/2018 CLINICAL DATA:  Shortness of Breath EXAM: PORTABLE CHEST 1 VIEW COMPARISON:  September 23, 2017 FINDINGS: There is no edema or consolidation. Heart is upper normal in size with pulmonary vascularity normal. Pacemaker leads are attached the right atrium and right ventricle. There is aortic atherosclerosis. No adenopathy. No pneumothorax. No bone lesions. IMPRESSION: No edema or consolidation. Stable cardiac silhouette. There is aortic atherosclerosis. Aortic Atherosclerosis (ICD10-I70.0). Electronically Signed   By: Lowella Grip III M.D.   On: 05/04/2018 12:30    EKG EKG Interpretation  Date/Time:  Friday May 04 2018 11:37:07 EST Ventricular Rate:  66 PR Interval:    QRS Duration: 145 QT Interval:  454 QTC Calculation: 476 R Axis:   -44 Text Interpretation:  Sinus rhythm Atrial premature complex Left bundle branch block No significant change since last tracing Confirmed by Lajean Saver [phone]) on 05/04/2018 12:00:00 PM   Radiology Ct Head Wo Contrast  Result Date: 05/04/2018 CLINICAL DATA:  Headache.  Recent fall EXAM: CT HEAD WITHOUT CONTRAST TECHNIQUE: Contiguous axial images were obtained from the base of the skull through the vertex without intravenous contrast. COMPARISON:  November 08, 2013 FINDINGS: Brain: The ventricles are normal in size and configuration. There is no intracranial mass, hemorrhage, extra-axial fluid collection, or midline shift. The brain  parenchyma appears unremarkable. No evident acute infarct. Vascular: There is no hyperdense vessel. There is calcification in each carotid siphon region. Skull: The bony calvarium appears intact. Sinuses/Orbits: There is mucosal thickening in the lateral right maxillary antrum. There is mucosal thickening in several ethmoid air cells. Orbits appear symmetric bilaterally. Other: Mastoid air cells are clear. IMPRESSION: Foci of arterial vascular calcification noted. There is mucosal thickening in several paranasal sinuses. Study otherwise unremarkable. Electronically Signed   By: Lowella Grip III M.D.   On: 05/04/2018 12:56   Dg Chest Port 1 View  Result Date: 05/04/2018 CLINICAL DATA:  Shortness of Breath EXAM: PORTABLE CHEST 1 VIEW COMPARISON:  September 23, 2017 FINDINGS: There is no edema or consolidation. Heart is upper normal in size with pulmonary vascularity normal. Pacemaker leads are attached the right atrium and right ventricle. There is aortic atherosclerosis. No adenopathy. No pneumothorax. No bone lesions. IMPRESSION: No edema or consolidation. Stable cardiac silhouette. There is aortic atherosclerosis. Aortic Atherosclerosis (ICD10-I70.0). Electronically Signed   By: Lowella Grip III M.D.   On: 05/04/2018 12:30    Procedures Procedures (including critical care time)  Medications Ordered in ED Medications  0.9 %  sodium chloride infusion (has no administration in time range)     Initial Impression / Assessment and Plan / ED Course  I have reviewed the triage vital signs and the nursing notes.  Pertinent labs & imaging results that were available during my care of the patient were reviewed by me and considered in my medical decision making (see chart for details).  Iv ns. Continuous pulse ox and monitor.   Pt very anxious, beginning to hyperventilate. Reassurance provided. Labs and imaging ordered.   Reviewed nursing notes and prior charts for additional history.   cxr  reviewed - no edema.   Ct reviewed - no acute process. Pt denies sinus drainage/pain.  Acetaminophen po. Po fluids. Food.   Pt is feeling improved, and is much less anxious than on arrival.   Labs reviewed - chem normal. bnp normal.  Pt feels improved.   UA is pending, signed out to Dr Rogene Houston, if ua neg, pt continues to feel improved, may d/c to home when UA resulted.      Final Clinical Impressions(s) / ED Diagnoses   Final diagnoses:  None    ED Discharge Orders    None       Lajean Saver, MD 05/04/18 1531

## 2018-05-04 NOTE — ED Triage Notes (Addendum)
Pt here c/o shortness of breath since this morning and weakness x1 week. Also had an unwitnessed fall this morning while ambulating to another room at home. Denies hitting head. No blood thinners on board. Pt drove herself to cardiologist's office (Dr. Lovena Le) where she was told to come to the ER to be evaluated. Pt appears to be very anxious and c/o a severe headache that started a few minutes ago in the emergency department. A/Ox4.

## 2018-05-04 NOTE — Patient Instructions (Addendum)
Medication Instructions:  Your physician recommends that you continue on your current medications as directed. Please refer to the Current Medication list given to you today.  Labwork: None ordered.  Testing/Procedures: None ordered.  Follow-Up: Your physician wants you to follow-up in: 6 months with Dr. Lovena Le.   You will receive a reminder letter in the mail two months in advance. If you don't receive a letter, please call our office to schedule the follow-up appointment.  Remote monitoring is used to monitor your ICD from home. This monitoring reduces the number of office visits required to check your device to one time per year. It allows [place] to keep an eye on the functioning of your device to ensure it is working properly. You are scheduled for a device check from home on 06/05/2018. You may send your transmission at any time that day. If you have a wireless device, the transmission will be sent automatically. After your physician reviews your transmission, you will receive a postcard with your next transmission date.  Any Other Special Instructions Will Be Listed Below (If Applicable).  If you need a refill on your cardiac medications before your next appointment, please call your pharmacy.

## 2018-05-07 ENCOUNTER — Other Ambulatory Visit: Payer: Self-pay | Admitting: Interventional Cardiology

## 2018-05-08 DIAGNOSIS — I480 Paroxysmal atrial fibrillation: Secondary | ICD-10-CM | POA: Diagnosis not present

## 2018-05-08 DIAGNOSIS — J4520 Mild intermittent asthma, uncomplicated: Secondary | ICD-10-CM | POA: Diagnosis not present

## 2018-05-08 DIAGNOSIS — I10 Essential (primary) hypertension: Secondary | ICD-10-CM | POA: Diagnosis not present

## 2018-05-08 DIAGNOSIS — R5383 Other fatigue: Secondary | ICD-10-CM | POA: Diagnosis not present

## 2018-05-08 DIAGNOSIS — F331 Major depressive disorder, recurrent, moderate: Secondary | ICD-10-CM | POA: Diagnosis not present

## 2018-05-08 DIAGNOSIS — M170 Bilateral primary osteoarthritis of knee: Secondary | ICD-10-CM | POA: Diagnosis not present

## 2018-05-09 ENCOUNTER — Ambulatory Visit (HOSPITAL_BASED_OUTPATIENT_CLINIC_OR_DEPARTMENT_OTHER): Payer: Medicare HMO | Attending: Interventional Cardiology | Admitting: Cardiology

## 2018-05-09 VITALS — Ht 63.5 in | Wt 198.0 lb

## 2018-05-09 DIAGNOSIS — R400 Somnolence: Secondary | ICD-10-CM | POA: Insufficient documentation

## 2018-05-09 DIAGNOSIS — G4733 Obstructive sleep apnea (adult) (pediatric): Secondary | ICD-10-CM

## 2018-05-09 DIAGNOSIS — R5383 Other fatigue: Secondary | ICD-10-CM | POA: Diagnosis not present

## 2018-05-13 NOTE — Procedures (Signed)
   Patient Name: Paula Booker, Paula Booker Date: 05/09/2018 Gender: Female D.O.B: 01-21-1946 Age (years): 72 Referring Provider: Larae Grooms Height (inches): 4 Interpreting Physician: Fransico Him MD, ABSM Weight (lbs): 198 RPSGT: [place], [place] BMI: 35 MRN: [id number] Neck Size: 14.00  CLINICAL INFORMATION Sleep Study Type: NPSG  Indication for sleep study: Fatigue, Insomnia  Epworth Sleepiness Score: 4  SLEEP STUDY TECHNIQUE As per the AASM Manual for the Scoring of Sleep and Associated Events v2.3 (April 2016) with a hypopnea requiring 4% desaturations.  The channels recorded and monitored were frontal, central and occipital EEG, electrooculogram (EOG), submentalis EMG (chin), nasal and oral airflow, thoracic and abdominal wall motion, anterior tibialis EMG, snore microphone, electrocardiogram, and pulse oximetry.  MEDICATIONS Medications self-administered by patient taken the night of the study : N/A  SLEEP ARCHITECTURE The study was initiated at 9:35:30 PM and ended at 5:04:15 AM.  Sleep onset time was 23.1 minutes and the sleep efficiency was 80.6%%. The total sleep time was 361.5 minutes.  Stage REM latency was 231.0 minutes.  The patient spent 14.1%% of the night in stage N1 sleep, 56.8%% in stage N2 sleep, 15.1%% in stage N3 and 14% in REM.  Alpha intrusion was absent.  Supine sleep was 30.61%.  RESPIRATORY PARAMETERS The overall apnea/hypopnea index (AHI) was 6.0 per hour. There were 12 total apneas, including 4 obstructive, 7 central and 1 mixed apneas. There were 24 hypopneas and 35 RERAs.  The AHI during Stage REM sleep was 3.6 per hour.  AHI while supine was 7.6 per hour.  The mean oxygen saturation was 91.5%. The minimum SpO2 during sleep was 85.0%.  moderate snoring was noted during this study.  CARDIAC DATA The 2 lead EKG demonstrated NSR. The mean heart rate was 71.1 beats per minute. Other EKG findings include: Ventricular salvos,  PVCs.  LEG MOVEMENT DATA The total PLMS were 0 with a resulting PLMS index of 0.0. Associated arousal with leg movement index was 0.0 .  IMPRESSIONS - Mild obstructive sleep apnea occurred during this study (AHI = 6.0/h). - No significant central sleep apnea occurred during this study (CAI = 1.2/h). - Mild oxygen desaturation was noted during this study (Min O2 = 85.0%). - The patient snored with moderate snoring volume. - EKG findings include Ventricular salvos, PVCs. - Clinically significant periodic limb movements did not occur during sleep. No significant associated arousals.  DIAGNOSIS - Obstructive Sleep Apnea (327.23 [G47.33 ICD-10])  RECOMMENDATIONS - Very mild obstructive sleep apnea. Return to discuss treatment options. - Avoid alcohol, sedatives and other CNS depressants that may worsen sleep apnea and disrupt normal sleep architecture. - Sleep hygiene should be reviewed to assess factors that may improve sleep quality. - Weight management and regular exercise should be initiated or continued if appropriate.  [Electronically signed] 05/13/2018 10:30 AM  Fransico Him MD, ABSM Diplomate, American Board of Sleep Medicine

## 2018-05-14 DIAGNOSIS — R0602 Shortness of breath: Secondary | ICD-10-CM | POA: Diagnosis not present

## 2018-05-14 DIAGNOSIS — J4530 Mild persistent asthma, uncomplicated: Secondary | ICD-10-CM | POA: Diagnosis not present

## 2018-05-14 DIAGNOSIS — I509 Heart failure, unspecified: Secondary | ICD-10-CM | POA: Diagnosis not present

## 2018-05-15 ENCOUNTER — Telehealth: Payer: Self-pay | Admitting: *Deleted

## 2018-05-15 NOTE — Telephone Encounter (Signed)
Informed patient of sleep study results and patient understanding was verbalized. Patient understands her sleep study showed Patient has very mild OSA - set up OV to discuss treatment options at next available OV. Patient has an [facility] appointment on 05/28/18 to discuss treatment options.

## 2018-05-15 NOTE — Telephone Encounter (Signed)
-----   Message from Paula Margarita, MD sent at 05/13/2018 10:34 AM EST ----- Patient has very mild OSA - set up OV to discuss treatment options at next available OV

## 2018-05-18 ENCOUNTER — Telehealth: Payer: Self-pay | Admitting: Cardiology

## 2018-05-18 DIAGNOSIS — G5601 Carpal tunnel syndrome, right upper limb: Secondary | ICD-10-CM | POA: Diagnosis not present

## 2018-05-18 NOTE — Telephone Encounter (Signed)
New Message   Patient calling for results for sleep study.

## 2018-05-22 NOTE — Telephone Encounter (Signed)
Called results lmtcb. 

## 2018-05-22 NOTE — Telephone Encounter (Addendum)
Return Call: OV has been made to discuss treatment options. Pt is aware and agreeable to treatment.

## 2018-05-25 ENCOUNTER — Encounter: Payer: Self-pay | Admitting: Cardiology

## 2018-05-27 ENCOUNTER — Encounter: Payer: Self-pay | Admitting: Cardiology

## 2018-05-27 DIAGNOSIS — G4733 Obstructive sleep apnea (adult) (pediatric): Secondary | ICD-10-CM

## 2018-05-27 HISTORY — DX: Obstructive sleep apnea (adult) (pediatric): G47.33

## 2018-05-27 NOTE — Progress Notes (Signed)
Cardiology Office Note:    Date:  05/28/2018   ID:  Paula Booker, DOB 08/19/1945, MRN [id number]  PCP:  [name], MD  Cardiologist:  Larae Grooms, MD    Referring MD: [name], MD   Chief Complaint  Patient presents with  . Sleep Apnea  . Hypertension    History of Present Illness:    Paula Booker is a 73 y.o. female with a hx of excess daytime sleepiness and insomnia who was referred by Dr. Irish Lack for sleep study to evaluate for OSA.  She was found to have very mild OSA with an [facility] of 6/hr and mild O2 desaturations as low as 85% and is now here for discussion of treatment options.    Past Medical History:  Diagnosis Date  . Anemia    as a child  . Anxiety   . Arthritis    "hands and knees" (03/22/2012)  . Asthma   . CHF (congestive heart failure) ([place]) 6/13  . Depression   . GERD (gastroesophageal reflux disease)   . Head injury, acute, with loss of consciousness ([place])   . Hypertension   . ICD (implantable cardiac defibrillator) in place Dec 2013  . Migraines    migraines   . NICM (nonischemic cardiomyopathy) ([place])   . Obesity (BMI 30-39.9)    negative sleep study 2014  . OSA (obstructive sleep apnea) 05/27/2018   Mild with AHI 6/hr  . Pacemaker   . Pneumonia    hx  . Shortness of breath    "@ any time I can get SOB" (03/22/2012)  . Skin cancer 1999  . WPW (Wolff-Parkinson-White syndrome)     Past Surgical History:  Procedure Laterality Date  . CARDIAC CATHETERIZATION  09/18/11   no significant CAD  . CARDIAC DEFIBRILLATOR PLACEMENT  03/22/2012   DDD  . CHOLECYSTECTOMY  ~ 2003  . IMPLANTABLE CARDIOVERTER DEFIBRILLATOR IMPLANT N/A 03/22/2012   Procedure: IMPLANTABLE CARDIOVERTER DEFIBRILLATOR IMPLANT;  Surgeon: Evans Lance, MD;  Location: [facility] CATH LAB;  Service: Cardiovascular;  Laterality: N/A;  . KNEE ARTHROSCOPY Left 11/29/2017   Procedure: ARTHROSCOPY LEFT KNEE;  Surgeon: Dorna Leitz, MD;  Location: [facility] ORS;  Service: Orthopedics;   Laterality: Left;  . KYPHOPLASTY N/A 11/27/2013   Procedure: KYPHOPLASTY;  Surgeon: Sinclair Ship, MD;  Location: [place];  Service: Orthopedics;  Laterality: N/A;  T9, T11 kyphoplasty  . KYPHOPLASTY N/A 06/05/2014   Procedure: KYPHOPLASTY;  Surgeon: Sinclair Ship, MD;  Location: [place];  Service: Orthopedics;  Laterality: N/A;  T7 kyphoplasty  . MOHS SURGERY  06/2011   "forehead" (03/22/2012)  . SKIN CANCER EXCISION  1999   "top of my head and my right back shoulder"  . SKIN CANCER EXCISION     back  . SKIN CANCER EXCISION     face  . SUPRAVENTRICULAR TACHYCARDIA ABLATION  03/22/2012   unable to induce VT/RN report 03/22/2012  . SUPRAVENTRICULAR TACHYCARDIA ABLATION N/A 03/22/2012   Procedure: SUPRAVENTRICULAR TACHYCARDIA ABLATION;  Surgeon: Evans Lance, MD;  Location: [facility] CATH LAB;  Service: Cardiovascular;  Laterality: N/A;  . TONSILLECTOMY AND ADENOIDECTOMY  1950  . TUBAL LIGATION  1978    Current Medications: Current Meds  Medication Sig  . albuterol (PROVENTIL HFA;VENTOLIN HFA) 108 (90 Base) MCG/ACT inhaler Inhale 2 puffs into the lungs every 6 (six) hours as needed for wheezing. For wheezing. Use 2 puffs 3 times daily x5 days and then every 6 hours as needed. (Patient taking differently: Inhale 2 puffs  into the lungs every 6 (six) hours as needed for wheezing. )  . albuterol (PROVENTIL HFA;VENTOLIN HFA) 108 (90 Base) MCG/ACT inhaler Inhale 2 puffs into the lungs every 6 (six) hours as needed for wheezing.  Marland Kitchen alendronate (FOSAMAX) 70 MG tablet Take 70 mg by mouth every Sunday.   . carvedilol (COREG) 12.5 MG tablet TAKE 1 TABLET BY MOUTH TWICE DAILY WITH A MEAL (Patient taking differently: Take 12.5 mg by mouth 2 (two) times daily with a meal. )  . cetirizine (ZYRTEC) 10 MG tablet Take 10 mg by mouth daily.  . Cholecalciferol (VITAMIN D3) 5000 units CAPS Take 5,000 Units by mouth daily.  . citalopram (CELEXA) 20 MG tablet Take 20 mg by mouth daily.   . Cyanocobalamin  (VITAMIN B-12) 5000 MCG LOZG Take 5,000 mcg by mouth daily.  . fluticasone (FLONASE) 50 MCG/ACT nasal spray Place 2 sprays into both nostrils daily.  . fluticasone furoate-vilanterol (BREO ELLIPTA) 200-25 MCG/INH AEPB Inhale 1 puff into the lungs daily.  . furosemide (LASIX) 40 MG tablet TAKE 1 TO 2 TABLETS BY MOUTH DAILY AS DIRECTED FOR EDEMA, SHORTNESS OF BREATH AND/OR WEIGHT GAIN (Patient taking differently: Take 80 mg by mouth daily. )  . ipratropium-albuterol (DUONEB) 0.5-2.5 (3) MG/3ML SOLN Take 3 mLs by nebulization every 4 (four) hours as needed.  Marland Kitchen losartan (COZAAR) 25 MG tablet TAKE 1 TABLET BY MOUTH TWICE DAILY (Patient taking differently: Take 25 mg by mouth 2 (two) times daily. )  . pantoprazole (PROTONIX) 40 MG tablet Take 40 mg by mouth daily.  Marland Kitchen spironolactone (ALDACTONE) 25 MG tablet TAKE 1 TABLET BY MOUTH ONCE DAILY     Allergies:   Ivp dye [iodinated diagnostic agents]; Penicillins; Sulfonamide derivatives; Tessalon [benzonatate]; Sulfa antibiotics; Rosuvastatin calcium; and Onion   Social History   Socioeconomic History  . Marital status: Divorced    Spouse name: Not on file  . Number of children: 3  . Years of education: Not on file  . Highest education level: Not on file  Occupational History  . Occupation: retired    Comment: Mining engineer  . Financial resource strain: Not on file  . Food insecurity:    Worry: Not on file    Inability: Not on file  . Transportation needs:    Medical: Not on file    Non-medical: Not on file  Tobacco Use  . Smoking status: Never Smoker  . Smokeless tobacco: Never Used  Substance and Sexual Activity  . Alcohol use: No  . Drug use: No  . Sexual activity: Not on file  Lifestyle  . Physical activity:    Days per week: Not on file    Minutes per session: Not on file  . Stress: Not on file  Relationships  . Social connections:    Talks on phone: Not on file    Gets together: Not on file    Attends religious  service: Not on file    Active member of club or organization: Not on file    Attends meetings of clubs or organizations: Not on file    Relationship status: Not on file  Other Topics Concern  . Not on file  Social History Narrative   Divorced, 3 children, 9 grandchildren     Family History: The patient's family history includes Atrial fibrillation in her brother and mother; Congestive Heart Failure in her brother, father, and mother; Deafness in her father and mother; Healthy in her sister, sister, and sister; Kidney Stones in  her mother; Tuberculosis in her paternal grandfather.  ROS:   Please see the history of present illness.    ROS  All other systems reviewed and negative.   EKGs/Labs/Other Studies Reviewed:    The following studies were reviewed today: Sleep study  EKG:  EKG is not ordered today.    Recent Labs: 09/23/2017: Magnesium 1.9 05/04/2018: ALT 15; B Natriuretic Peptide 62.5; BUN 18; Creatinine, Ser 0.95; Hemoglobin 14.3; Platelets 247; Potassium 3.7; Sodium 134   Recent Lipid Panel No results found for: CHOL, TRIG, HDL, CHOLHDL, VLDL, LDLCALC, LDLDIRECT  Physical Exam:    VS:  There were no vitals taken for this visit.    Wt Readings from Last 3 Encounters:  05/09/18 198 lb (89.8 kg)  05/04/18 201 lb (91.2 kg)  03/14/18 207 lb 6.4 oz (94.1 kg)     GEN:  Well nourished, well developed in no acute distress HEENT: Normal NECK: No JVD; No carotid bruits LYMPHATICS: No lymphadenopathy CARDIAC: RRR, no murmurs, rubs, gallops RESPIRATORY:  Clear to auscultation without rales, wheezing or rhonchi  ABDOMEN: Soft, non-tender, non-distended MUSCULOSKELETAL:  No edema; No deformity  SKIN: Warm and dry NEUROLOGIC:  Alert and oriented x 3 PSYCHIATRIC:  Normal affect   ASSESSMENT:    1. OSA (obstructive sleep apnea)   2. Essential hypertension   3. Obesity (BMI 30-39.9)    PLAN:    In order of problems listed above:  1.  OSA -  She has complained of  fatigue and snoring and sleep study showed mild OSA with an AHi of 6/hr.    2.  HTN - BP is controlled on exam today.  She will continue on Carveidlol 12.5mg  BID, Losartan 25mg  BID and spiro 25mg  daily.    3.  Obesity - I have encouraged her to get into a routine exercise program and cut back on carbs and portions.    Medication Adjustments/Labs and Tests Ordered: Current medicines are reviewed at length with the patient today.  Concerns regarding medicines are outlined above.  No orders of the defined types were placed in this encounter.  No orders of the defined types were placed in this encounter.   Signed, Fransico Him, MD  05/28/2018 4:00 PM    [place]

## 2018-05-28 ENCOUNTER — Ambulatory Visit (INDEPENDENT_AMBULATORY_CARE_PROVIDER_SITE_OTHER): Payer: Medicare HMO | Admitting: Cardiology

## 2018-05-28 ENCOUNTER — Encounter: Payer: Self-pay | Admitting: Cardiology

## 2018-05-28 VITALS — BP 108/66 | HR 74 | Ht 63.5 in | Wt 201.6 lb

## 2018-05-28 DIAGNOSIS — I10 Essential (primary) hypertension: Secondary | ICD-10-CM

## 2018-05-28 DIAGNOSIS — G4733 Obstructive sleep apnea (adult) (pediatric): Secondary | ICD-10-CM | POA: Diagnosis not present

## 2018-05-28 DIAGNOSIS — E669 Obesity, unspecified: Secondary | ICD-10-CM | POA: Diagnosis not present

## 2018-05-28 NOTE — Patient Instructions (Signed)
Medication Instructions:  Your physician recommends that you continue on your current medications as directed. Please refer to the Current Medication list given to you today.  If you need a refill on your cardiac medications before your next appointment, please call your pharmacy.   Lab work: None If you have labs (blood work) drawn today and your tests are completely normal, you will receive your results only by: Marland Kitchen MyChart Message (if you have MyChart) OR . A paper copy in the mail If you have any lab test that is abnormal or we need to change your treatment, we will call you to review the results.  Testing/Procedures: None  Follow-Up: As needed.

## 2018-06-04 NOTE — Telephone Encounter (Signed)
Left message for patient to call back regarding MyChart Message

## 2018-06-05 ENCOUNTER — Ambulatory Visit (INDEPENDENT_AMBULATORY_CARE_PROVIDER_SITE_OTHER): Payer: Medicare HMO

## 2018-06-05 DIAGNOSIS — I5022 Chronic systolic (congestive) heart failure: Secondary | ICD-10-CM

## 2018-06-05 DIAGNOSIS — Z95810 Presence of automatic (implantable) cardiac defibrillator: Secondary | ICD-10-CM | POA: Diagnosis not present

## 2018-06-05 NOTE — Progress Notes (Signed)
EPIC Encounter for ICM Monitoring  Patient Name: Paula Booker is a 73 y.o. female Date: 06/05/2018 Primary Care Physican: [name], MD Primary [place] Electrophysiologist: Lovena Le Last Weight:197lbs  Heart Failure questions reviewed, pt asymptomatic.  Thoracic impedance abnormal suggesting fluid accumulation for the last few days.    Prescribed:Furosemide 40 mg 2 tablets(80 mg total)daily.  Labs: 05/04/2018 Creatinine 0.95, BUN 18, Potassium 3.7, Sodium 134, GFR 60 11/28/2017 Creatinine 0.97, BUN 15, Potassium 3.7, Sodium 140, GFR 57->60 09/26/2017 Creatinine0.95, BUN26, Potassium3.9, Sodium138, GFR59->60 09/25/2017 Creatinine0.92, BUN28, Potassium4.2, Sodium136, GFR>60  09/24/2017 Creatinine1.06, BUN27, Potassium3.7, Sodium132, GFR52-60  09/23/2017 Creatinine0.85, BUN18, Potassium4.0, Sodium136, GFR>60 07/27/2017 Creatinine 0.92, BUN 16, Potassium 3.5, Sodium 137, GFR >60  Recommendations:No changes.  Advised to limit salt intake and call if any fluid symptoms.   Follow-up plan: ICM clinic phone appointment on3/30/2020.  Copy of ICM check sent to Dr.Taylor.  3 month ICM trend: 06/05/2018    1 Year ICM trend:       [name], RN 06/05/2018 4:19 PM

## 2018-06-06 ENCOUNTER — Other Ambulatory Visit: Payer: Self-pay | Admitting: Interventional Cardiology

## 2018-06-06 DIAGNOSIS — G5601 Carpal tunnel syndrome, right upper limb: Secondary | ICD-10-CM | POA: Diagnosis not present

## 2018-06-12 DIAGNOSIS — I509 Heart failure, unspecified: Secondary | ICD-10-CM | POA: Diagnosis not present

## 2018-06-12 DIAGNOSIS — J4530 Mild persistent asthma, uncomplicated: Secondary | ICD-10-CM | POA: Diagnosis not present

## 2018-06-12 DIAGNOSIS — R062 Wheezing: Secondary | ICD-10-CM | POA: Diagnosis not present

## 2018-06-22 DIAGNOSIS — F5101 Primary insomnia: Secondary | ICD-10-CM | POA: Diagnosis not present

## 2018-06-22 DIAGNOSIS — F331 Major depressive disorder, recurrent, moderate: Secondary | ICD-10-CM | POA: Diagnosis not present

## 2018-06-22 DIAGNOSIS — G4733 Obstructive sleep apnea (adult) (pediatric): Secondary | ICD-10-CM | POA: Diagnosis not present

## 2018-06-22 DIAGNOSIS — I10 Essential (primary) hypertension: Secondary | ICD-10-CM | POA: Diagnosis not present

## 2018-06-22 DIAGNOSIS — J302 Other seasonal allergic rhinitis: Secondary | ICD-10-CM | POA: Diagnosis not present

## 2018-07-03 ENCOUNTER — Other Ambulatory Visit: Payer: Self-pay | Admitting: Internal Medicine

## 2018-07-03 DIAGNOSIS — I428 Other cardiomyopathies: Secondary | ICD-10-CM

## 2018-07-09 ENCOUNTER — Other Ambulatory Visit: Payer: Self-pay

## 2018-07-09 ENCOUNTER — Ambulatory Visit (INDEPENDENT_AMBULATORY_CARE_PROVIDER_SITE_OTHER): Payer: Medicare HMO

## 2018-07-09 DIAGNOSIS — Z95810 Presence of automatic (implantable) cardiac defibrillator: Secondary | ICD-10-CM | POA: Diagnosis not present

## 2018-07-09 DIAGNOSIS — I5022 Chronic systolic (congestive) heart failure: Secondary | ICD-10-CM

## 2018-07-09 NOTE — Progress Notes (Signed)
EPIC Encounter for ICM Monitoring  Patient Name: Nataly Pacifico is a 73 y.o. female Date: 07/09/2018 Primary Care Physican: [name], MD Primary [place] Electrophysiologist: Lovena Le Last Weight:197lbs   Heart Failure questions reviewed, pt asymptomatic.  She does not use salt shaker but does eat foods that are salty such as pizza and pickles.   Thoracic impedance abnormalsince 06/20/2018.  Prescribed:Furosemide 40 mg 2 tablets(80 mg total)daily.  Labs: 05/04/2018 Creatinine 0.95, BUN 18, Potassium 3.7, Sodium 134, GFR 60 11/28/2017 Creatinine 0.97, BUN 15, Potassium 3.7, Sodium 140, GFR 57->60 09/26/2017 Creatinine0.95, BUN26, Potassium3.9, Sodium138, GFR59->60 09/25/2017 Creatinine0.92, BUN28, Potassium4.2, Sodium136, GFR>60  09/24/2017 Creatinine1.06, BUN27, Potassium3.7, Sodium132, GFR52-60  09/23/2017 Creatinine0.85, BUN18, Potassium4.0, Sodium136, GFR>60 07/27/2017 Creatinine 0.92, BUN 16, Potassium 3.5, Sodium 137, GFR >60  Recommendations:Advised to check labels for salt intake and limit to 2000 mg.    Follow-up plan: ICM clinic phone appointment on4/14/2020.  Copy of ICM check sent to Dr.Taylor and Dr Irish Lack.  3 month ICM trend: 07/09/2018    1 Year ICM trend:       [name], RN 07/09/2018 4:44 PM

## 2018-07-10 ENCOUNTER — Other Ambulatory Visit: Payer: Self-pay

## 2018-07-10 ENCOUNTER — Ambulatory Visit (INDEPENDENT_AMBULATORY_CARE_PROVIDER_SITE_OTHER): Payer: Medicare HMO | Admitting: *Deleted

## 2018-07-10 DIAGNOSIS — I428 Other cardiomyopathies: Secondary | ICD-10-CM | POA: Diagnosis not present

## 2018-07-10 LAB — CUP PACEART REMOTE DEVICE CHECK
Battery Remaining Longevity: 46 mo
Battery Voltage: 2.94 V
Brady Statistic AP VP Percent: 0 %
Brady Statistic AP VS Percent: 11.47 %
Brady Statistic AS VP Percent: 0.02 %
Brady Statistic AS VS Percent: 88.5 %
Brady Statistic RA Percent Paced: 11.22 %
Brady Statistic RV Percent Paced: 0.03 %
Date Time Interrogation Session: 20200331094350
HighPow Impedance: 88 Ohm
Implantable Lead Implant Date: 20131212
Implantable Lead Implant Date: 20131212
Implantable Lead Location: 753860
Implantable Lead Model: 5076
Implantable Lead Model: 6935
Lead Channel Impedance Value: 475 Ohm
Lead Channel Impedance Value: 760 Ohm
Lead Channel Impedance Value: 931 Ohm
Lead Channel Pacing Threshold Amplitude: 0.5 V
Lead Channel Pacing Threshold Amplitude: 0.5 V
Lead Channel Pacing Threshold Pulse Width: 0.4 ms
Lead Channel Pacing Threshold Pulse Width: 0.4 ms
Lead Channel Sensing Intrinsic Amplitude: 3 mV
Lead Channel Sensing Intrinsic Amplitude: 3 mV
Lead Channel Sensing Intrinsic Amplitude: 9.125 mV
Lead Channel Sensing Intrinsic Amplitude: 9.125 mV
Lead Channel Setting Pacing Amplitude: 2 V
Lead Channel Setting Pacing Pulse Width: 0.4 ms
Lead Channel Setting Sensing Sensitivity: 0.3 mV
MDC IDC LEAD LOCATION: 753859
MDC IDC PG IMPLANT DT: 20131212
MDC IDC SET LEADCHNL RV PACING AMPLITUDE: 2.5 V

## 2018-07-13 DIAGNOSIS — J4530 Mild persistent asthma, uncomplicated: Secondary | ICD-10-CM | POA: Diagnosis not present

## 2018-07-13 DIAGNOSIS — R062 Wheezing: Secondary | ICD-10-CM | POA: Diagnosis not present

## 2018-07-13 DIAGNOSIS — I509 Heart failure, unspecified: Secondary | ICD-10-CM | POA: Diagnosis not present

## 2018-07-17 ENCOUNTER — Encounter: Payer: Self-pay | Admitting: Cardiology

## 2018-07-17 NOTE — Progress Notes (Signed)
Remote ICD transmission.   

## 2018-07-24 ENCOUNTER — Ambulatory Visit (INDEPENDENT_AMBULATORY_CARE_PROVIDER_SITE_OTHER): Payer: Medicare HMO

## 2018-07-24 ENCOUNTER — Other Ambulatory Visit: Payer: Self-pay

## 2018-07-24 DIAGNOSIS — I5022 Chronic systolic (congestive) heart failure: Secondary | ICD-10-CM

## 2018-07-24 DIAGNOSIS — Z95810 Presence of automatic (implantable) cardiac defibrillator: Secondary | ICD-10-CM

## 2018-07-24 NOTE — Progress Notes (Signed)
EPIC Encounter for ICM Monitoring  Patient Name: Paula Booker is a 73 y.o. female Date: 07/24/2018 Primary Care Physican: [name], MD Primary [place] Electrophysiologist: Lovena Le Last Weight:197lbs 07/24/2018 Weight: 197 lbs   Heart Failure questions reviewed, pt asymptomatic.  She has a raised area on the outside of her shin that is swollen but unsure how it happened.  Advised to call PCP regarding the area.    Thoracic impedance abnormalsince 06/20/2018.  Prescribed:Furosemide40 mg 2 tablets(80 mg total)daily.  Labs: 05/04/2018 Creatinine 0.95, BUN 18, Potassium 3.7, Sodium 134, GFR 60 11/28/2017 Creatinine 0.97, BUN 15, Potassium 3.7, Sodium 140, GFR 57->60 09/26/2017 Creatinine0.95, BUN26, Potassium3.9, Sodium138, GFR59->60 09/25/2017 Creatinine0.92, BUN28, Potassium4.2, Sodium136, GFR>60  09/24/2017 Creatinine1.06, BUN27, Potassium3.7, Sodium132, GFR52-60  09/23/2017 Creatinine0.85, BUN18, Potassium4.0, Sodium136, GFR>60 07/27/2017 Creatinine 0.92, BUN 16, Potassium 3.5, Sodium 137, GFR >60  Recommendations:Advised to take Furosemide 80 mg every morning and 40 mg every night x 3 days and then returned to prescribed dosage of 80 mg daily.   Follow-up plan: ICM clinic phone appointment on4/20/2020 to recheck fluid levels.  Copy of ICM check sent to Dr.Taylor and Dr Irish Lack.   3 month ICM trend: 07/24/2018    1 Year ICM trend:       [name], RN 07/24/2018 4:11 PM

## 2018-07-30 ENCOUNTER — Other Ambulatory Visit: Payer: Self-pay

## 2018-07-30 ENCOUNTER — Ambulatory Visit (INDEPENDENT_AMBULATORY_CARE_PROVIDER_SITE_OTHER): Payer: Medicare HMO

## 2018-07-30 ENCOUNTER — Telehealth: Payer: Self-pay

## 2018-07-30 DIAGNOSIS — I5022 Chronic systolic (congestive) heart failure: Secondary | ICD-10-CM

## 2018-07-30 DIAGNOSIS — Z95810 Presence of automatic (implantable) cardiac defibrillator: Secondary | ICD-10-CM

## 2018-07-30 NOTE — Telephone Encounter (Signed)
Remote ICM transmission received.  Attempted call to patient regarding ICM remote transmission and disconnected after 2 rings.

## 2018-07-30 NOTE — Progress Notes (Signed)
EPIC Encounter for ICM Monitoring  Patient Name: Paula Booker is a 73 y.o. female Date: 07/30/2018 Primary Care Physican: [name], MD Primary [place] Electrophysiologist: Lovena Le Last Weight:197lbs 07/24/2018 Weight: 197 lbs 07/30/2018 Weight: unknown   Attempted call to patient and unable to reach.  Transmission reviewed.   Thoracic impedance returned close to baseline normal after taking extra Furosemide x 3 days.  Prescribed:Furosemide40 mg 2 tablets(80 mg total)daily.  Labs: 05/04/2018 Creatinine 0.95, BUN 18, Potassium 3.7, Sodium 134, GFR 60 11/28/2017 Creatinine 0.97, BUN 15, Potassium 3.7, Sodium 140, GFR 57->60 09/26/2017 Creatinine0.95, BUN26, Potassium3.9, Sodium138, GFR59->60 09/25/2017 Creatinine0.92, BUN28, Potassium4.2, Sodium136, GFR>60  09/24/2017 Creatinine1.06, BUN27, Potassium3.7, Sodium132, GFR52-60  09/23/2017 Creatinine0.85, BUN18, Potassium4.0, Sodium136, GFR>60 07/27/2017 Creatinine 0.92, BUN 16, Potassium 3.5, Sodium 137, GFR >60  Recommendations:Unable to reach.  Follow-up plan: ICM clinic phone appointment on5/07/2018.  Copy of ICM check sent to Dr.Taylorand Dr Irish Lack.    3 month ICM trend: 07/30/2018    1 Year ICM trend:       [name], RN 07/30/2018 2:24 PM

## 2018-07-31 NOTE — Progress Notes (Signed)
Returned patient call as requested by voice mail message.  She said she is doing fine and denies any fluid symptoms. Weight stable.  Transmisison reviewed and next remote transmission scheduled for 08/13/2018.  No changes today and encouraged to call for any fluid symptoms.

## 2018-08-09 DIAGNOSIS — M25561 Pain in right knee: Secondary | ICD-10-CM | POA: Diagnosis not present

## 2018-08-12 DIAGNOSIS — R062 Wheezing: Secondary | ICD-10-CM | POA: Diagnosis not present

## 2018-08-12 DIAGNOSIS — J4530 Mild persistent asthma, uncomplicated: Secondary | ICD-10-CM | POA: Diagnosis not present

## 2018-08-12 DIAGNOSIS — I509 Heart failure, unspecified: Secondary | ICD-10-CM | POA: Diagnosis not present

## 2018-08-13 ENCOUNTER — Ambulatory Visit (INDEPENDENT_AMBULATORY_CARE_PROVIDER_SITE_OTHER): Payer: Medicare HMO

## 2018-08-13 ENCOUNTER — Other Ambulatory Visit: Payer: Self-pay

## 2018-08-13 DIAGNOSIS — Z95810 Presence of automatic (implantable) cardiac defibrillator: Secondary | ICD-10-CM | POA: Diagnosis not present

## 2018-08-13 DIAGNOSIS — F419 Anxiety disorder, unspecified: Secondary | ICD-10-CM | POA: Diagnosis not present

## 2018-08-13 DIAGNOSIS — I5022 Chronic systolic (congestive) heart failure: Secondary | ICD-10-CM | POA: Diagnosis not present

## 2018-08-13 DIAGNOSIS — F332 Major depressive disorder, recurrent severe without psychotic features: Secondary | ICD-10-CM | POA: Diagnosis not present

## 2018-08-13 NOTE — Progress Notes (Signed)
EPIC Encounter for ICM Monitoring  Patient Name: Paula Booker is a 73 y.o. female Date: 08/13/2018 Primary Care Physican: [name], MD Primary [place] Electrophysiologist: Lovena Le Last Weight:197lbs 07/24/2018 Weight: 197 lbs 08/13/2018 Weight: 201 lbs to 197 lbs   Call to patient and she reports weight increased 3-4 pounds in the last week due to she ate foods with a lot of salt.   The weight has decreased over the last couple of days.   Thoracic impedance abnormal suggesting fluid accumulation is worse since 08/05/2018.  Prescribed:Furosemide40 mg 2 tablets(80 mg total)daily.  Labs: 05/04/2018 Creatinine 0.95, BUN 18, Potassium 3.7, Sodium 134, GFR 60 11/28/2017 Creatinine 0.97, BUN 15, Potassium 3.7, Sodium 140, GFR 57->60 09/26/2017 Creatinine0.95, BUN26, Potassium3.9, Sodium138, GFR59->60 09/25/2017 Creatinine0.92, BUN28, Potassium4.2, Sodium136, GFR>60  09/24/2017 Creatinine1.06, BUN27, Potassium3.7, Sodium132, GFR52-60  09/23/2017 Creatinine0.85, BUN18, Potassium4.0, Sodium136, GFR>60 07/27/2017 Creatinine 0.92, BUN 16, Potassium 3.5, Sodium 137, GFR >60  Recommendations:She reports she will take extra Furosemide in the afternoon for a couple of days.  Advised to limit daily salt intake as discussed during previous ICM calls.   Follow-up plan: ICM clinic phone appointment on5/02/2019 to recheck fluid levels.  Copy of ICM check sent to Dr.Taylorand Dr Irish Lack.   3 month ICM trend: 08/13/2018    1 Year ICM trend:       [name], RN 08/13/2018 1:25 PM

## 2018-08-20 ENCOUNTER — Other Ambulatory Visit: Payer: Self-pay

## 2018-08-20 ENCOUNTER — Ambulatory Visit (INDEPENDENT_AMBULATORY_CARE_PROVIDER_SITE_OTHER): Payer: Medicare HMO

## 2018-08-20 DIAGNOSIS — Z95810 Presence of automatic (implantable) cardiac defibrillator: Secondary | ICD-10-CM

## 2018-08-20 DIAGNOSIS — I5022 Chronic systolic (congestive) heart failure: Secondary | ICD-10-CM

## 2018-08-21 NOTE — Progress Notes (Signed)
EPIC Encounter for ICM Monitoring  Patient Name: Paula Booker is a 73 y.o. female Date: 08/21/2018 Primary Care Physican: [name], MD Primary [place] Electrophysiologist: Lovena Le Last Weight:197lbs 07/24/2018 Weight: 197 lbs 08/13/2018 Weight:201 lbs to 197 lbs 08/21/2018 Weight: 195 lbs   Transmission reviewed.  Call to patient and is doing well.  Returned to baseline weight after taking extra Furosemide.  Thoracic impedance returned to normalafter taking extra Furosemide.  Prescribed:Furosemide40 mg 2 tablets(80 mg total)daily.  Labs: 05/04/2018 Creatinine 0.95, BUN 18, Potassium 3.7, Sodium 134, GFR 60 11/28/2017 Creatinine 0.97, BUN 15, Potassium 3.7, Sodium 140, GFR 57->60 09/26/2017 Creatinine0.95, BUN26, Potassium3.9, Sodium138, GFR59->60 09/25/2017 Creatinine0.92, BUN28, Potassium4.2, Sodium136, GFR>60  09/24/2017 Creatinine1.06, BUN27, Potassium3.7, Sodium132, GFR52-60  09/23/2017 Creatinine0.85, BUN18, Potassium4.0, Sodium136, GFR>60 07/27/2017 Creatinine 0.92, BUN 16, Potassium 3.5, Sodium 137, GFR >60  Recommendations:Advised to continue with low salt diet.    Follow-up plan: ICM clinic phone appointment on 09/17/2018.  Copy of ICM check sent to Dr.Taylor.  3 month ICM trend: 08/20/2018    1 Year ICM trend:       [name], RN 08/21/2018 10:13 AM

## 2018-09-05 DIAGNOSIS — F331 Major depressive disorder, recurrent, moderate: Secondary | ICD-10-CM | POA: Diagnosis not present

## 2018-09-05 DIAGNOSIS — R5383 Other fatigue: Secondary | ICD-10-CM | POA: Diagnosis not present

## 2018-09-06 DIAGNOSIS — M7661 Achilles tendinitis, right leg: Secondary | ICD-10-CM | POA: Diagnosis not present

## 2018-09-06 DIAGNOSIS — M1712 Unilateral primary osteoarthritis, left knee: Secondary | ICD-10-CM | POA: Diagnosis not present

## 2018-09-12 DIAGNOSIS — R062 Wheezing: Secondary | ICD-10-CM | POA: Diagnosis not present

## 2018-09-12 DIAGNOSIS — J4530 Mild persistent asthma, uncomplicated: Secondary | ICD-10-CM | POA: Diagnosis not present

## 2018-09-12 DIAGNOSIS — I509 Heart failure, unspecified: Secondary | ICD-10-CM | POA: Diagnosis not present

## 2018-09-17 ENCOUNTER — Ambulatory Visit (INDEPENDENT_AMBULATORY_CARE_PROVIDER_SITE_OTHER): Payer: Medicare HMO

## 2018-09-17 DIAGNOSIS — Z95810 Presence of automatic (implantable) cardiac defibrillator: Secondary | ICD-10-CM

## 2018-09-17 DIAGNOSIS — I5022 Chronic systolic (congestive) heart failure: Secondary | ICD-10-CM | POA: Diagnosis not present

## 2018-09-18 NOTE — Progress Notes (Signed)
EPIC Encounter for ICM Monitoring  Patient Name: Paula Booker is a 73 y.o. female Date: 09/18/2018 Primary Care Physican: [name], MD Primary [place] Electrophysiologist: Lovena Le Last Weight:197lbs 08/13/2018 Weight:201 lbs to 197 lbs 08/21/2018 Weight: 195 lbs   Attempted call to patient and unable to reach.  Transmission reviewed.   Transmission results sent via mychart.   Thoracic impedance abnormalsuggesting fluid accumulation starting 08/21/2018 - 09/12/2018 and starting again 09/16/2018.  Report suggests fluid accumulation for majority of days since March.   Prescribed:Furosemide40 mg 2 tablets(80 mg total)daily.  Labs: 05/04/2018 Creatinine 0.95, BUN 18, Potassium 3.7, Sodium 134, GFR 60  Recommendations:Unable to reach.      Follow-up plan: ICM clinic phone appointment on 09/26/2018 to recheck fluid levels.  Copy of ICM check sent to Dr.Taylor and Dr Irish Lack for review and recommendations if needed.   3 month ICM trend: 09/18/2018    1 Year ICM trend:     [name], RN 09/18/2018 10:03 AM

## 2018-09-19 ENCOUNTER — Telehealth: Payer: Self-pay

## 2018-09-19 NOTE — Telephone Encounter (Signed)
Remote ICM transmission received.  Attempted call to patient regarding ICM remote transmission and voice mail would not allow to leave complete message.

## 2018-09-26 ENCOUNTER — Ambulatory Visit (INDEPENDENT_AMBULATORY_CARE_PROVIDER_SITE_OTHER): Payer: Medicare HMO

## 2018-09-26 ENCOUNTER — Telehealth: Payer: Self-pay

## 2018-09-26 DIAGNOSIS — I5022 Chronic systolic (congestive) heart failure: Secondary | ICD-10-CM

## 2018-09-26 DIAGNOSIS — Z95810 Presence of automatic (implantable) cardiac defibrillator: Secondary | ICD-10-CM

## 2018-09-26 NOTE — Telephone Encounter (Signed)
Left message for patient to remind of missed remote transmission.  

## 2018-09-28 NOTE — Progress Notes (Signed)
EPIC Encounter for ICM Monitoring  Patient Name: Paula Booker is a 73 y.o. female Date: 09/28/2018 Primary Care Physican: [name], MD Primary [place] Electrophysiologist: Lovena Le Last Weight:197lbs 08/13/2018 Weight:201 lbs to 197 lbs 08/21/2018 Weight: 195 lbs   Transmission reviewed.  Transmission results sent via mychart.  Thoracic impedancereturned to normal since last ICM remote transmission.    Prescribed:Furosemide40 mg 2 tablets(80 mg total)daily.  Labs: 05/04/2018 Creatinine 0.95, BUN 18, Potassium 3.7, Sodium 134, GFR 60  Recommendations:Advised to limit salt and fluid intake via [employer].  Follow-up plan: ICM clinic phone appointment on7/15/2020.  Copy of ICM check sent to Dr.Taylor  3 month ICM trend: 09/26/2018    1 Year ICM trend:       [name], RN 09/28/2018 1:27 PM

## 2018-10-09 ENCOUNTER — Ambulatory Visit (INDEPENDENT_AMBULATORY_CARE_PROVIDER_SITE_OTHER): Payer: Medicare HMO | Admitting: *Deleted

## 2018-10-09 DIAGNOSIS — I428 Other cardiomyopathies: Secondary | ICD-10-CM | POA: Diagnosis not present

## 2018-10-09 LAB — CUP PACEART REMOTE DEVICE CHECK
Battery Remaining Longevity: 43 mo
Battery Voltage: 2.94 V
Brady Statistic AP VP Percent: 0.02 %
Brady Statistic AP VS Percent: 10.69 %
Brady Statistic AS VP Percent: 0.02 %
Brady Statistic AS VS Percent: 89.26 %
Brady Statistic RA Percent Paced: 10.46 %
Brady Statistic RV Percent Paced: 0.04 %
Date Time Interrogation Session: 20200630062827
HighPow Impedance: 85 Ohm
Implantable Lead Implant Date: 20131212
Implantable Lead Implant Date: 20131212
Implantable Lead Location: 753859
Implantable Lead Location: 753860
Implantable Lead Model: 5076
Implantable Lead Model: 6935
Implantable Pulse Generator Implant Date: 20131212
Lead Channel Impedance Value: 475 Ohm
Lead Channel Impedance Value: 703 Ohm
Lead Channel Impedance Value: 893 Ohm
Lead Channel Pacing Threshold Amplitude: 0.375 V
Lead Channel Pacing Threshold Amplitude: 0.625 V
Lead Channel Pacing Threshold Pulse Width: 0.4 ms
Lead Channel Pacing Threshold Pulse Width: 0.4 ms
Lead Channel Sensing Intrinsic Amplitude: 3 mV
Lead Channel Sensing Intrinsic Amplitude: 3 mV
Lead Channel Sensing Intrinsic Amplitude: 9.625 mV
Lead Channel Sensing Intrinsic Amplitude: 9.625 mV
Lead Channel Setting Pacing Amplitude: 2 V
Lead Channel Setting Pacing Amplitude: 2.5 V
Lead Channel Setting Pacing Pulse Width: 0.4 ms
Lead Channel Setting Sensing Sensitivity: 0.3 mV

## 2018-10-12 DIAGNOSIS — I509 Heart failure, unspecified: Secondary | ICD-10-CM | POA: Diagnosis not present

## 2018-10-12 DIAGNOSIS — R062 Wheezing: Secondary | ICD-10-CM | POA: Diagnosis not present

## 2018-10-12 DIAGNOSIS — J4530 Mild persistent asthma, uncomplicated: Secondary | ICD-10-CM | POA: Diagnosis not present

## 2018-10-15 DIAGNOSIS — Z0000 Encounter for general adult medical examination without abnormal findings: Secondary | ICD-10-CM | POA: Diagnosis not present

## 2018-10-20 ENCOUNTER — Encounter: Payer: Self-pay | Admitting: Cardiology

## 2018-10-20 NOTE — Progress Notes (Signed)
Remote ICD transmission.   

## 2018-10-24 ENCOUNTER — Ambulatory Visit (INDEPENDENT_AMBULATORY_CARE_PROVIDER_SITE_OTHER): Payer: Medicare HMO

## 2018-10-24 ENCOUNTER — Telehealth: Payer: Self-pay

## 2018-10-24 DIAGNOSIS — I5022 Chronic systolic (congestive) heart failure: Secondary | ICD-10-CM | POA: Diagnosis not present

## 2018-10-24 DIAGNOSIS — Z95810 Presence of automatic (implantable) cardiac defibrillator: Secondary | ICD-10-CM | POA: Diagnosis not present

## 2018-10-24 NOTE — Telephone Encounter (Signed)
Remote ICM transmission received.  Attempted call to patient regarding ICM remote transmission and left detailed message, per DPR, to return call.    

## 2018-10-24 NOTE — Progress Notes (Signed)
EPIC Encounter for ICM Monitoring  Patient Name: Paula Booker is a 73 y.o. female Date: 10/24/2018 Primary Care Physican: [name], MD Primary Cardiologist:[place] Electrophysiologist: Lovena Le 08/21/2018 Weight: 195 lbs 10/24/2018 Weight: 196 lbs  Spoke with patient.  She says she has not been feeling well in the last month.  She is experiencing fatigue and tiredness.  It is difficult for her to have the energy to complete daily routine activities.  She is following strict low salt diet and unsure what is causing fluid accumulation.  She asked if Furosemide long term dosage needs to be increased and advised that is a decision Dr Irish Lack would make.    Optivol thoracic impedancesuggestive of possible fluid accumulation starting 09/27/2018 and is ongoing with exception of a couple of days at baseline.    Thoracic impedance shows more days within the last year with possible fluid accumulation versus days at baseline normal.    Prescribed:Furosemide40 mg 2 tablets(80 mg total)daily.  Labs: 05/04/2018 Creatinine 0.95, BUN 18, Potassium 3.7, Sodium 134, GFR 60  Recommendations: Advised to take extra 40 mg Furosemide 1 tablet x 2 days and then return to prescribed dosage.  Encouraged her to schedule office visit with Dr Irish Lack due to her ongoing tiredness, fatigue as well as a review of her medications.   She will call today to make an appt.   Follow-up plan: ICM clinic phone appointment on7/24/2020 to recheck fluid levels. Virtual phone visit with Dr Lovena Le on 11/02/2018.    Copy of ICM check sent to Dr.Taylor and Dr Irish Lack for review and will call back if any recommendations.  3 month ICM trend: 10/22/2018    1 Year ICM trend:       [name], RN 10/24/2018 8:45 AM

## 2018-10-31 ENCOUNTER — Telehealth: Payer: Self-pay | Admitting: *Deleted

## 2018-10-31 NOTE — Telephone Encounter (Signed)
Calling patient today to discuss upcoming appointment.  We are currently trying to limit exposure to the virus that causes COVID-19 by seeing patients at home rather than in the office. We would like to schedule this appointment as a Virtual Appointment VIA Smartphone or Laptop. Unable to reach patient.  LVMTCB  

## 2018-11-02 ENCOUNTER — Encounter: Payer: Self-pay | Admitting: Internal Medicine

## 2018-11-02 ENCOUNTER — Ambulatory Visit (INDEPENDENT_AMBULATORY_CARE_PROVIDER_SITE_OTHER): Payer: Medicare HMO

## 2018-11-02 ENCOUNTER — Ambulatory Visit (INDEPENDENT_AMBULATORY_CARE_PROVIDER_SITE_OTHER): Payer: Medicare HMO | Admitting: Internal Medicine

## 2018-11-02 ENCOUNTER — Other Ambulatory Visit: Payer: Self-pay

## 2018-11-02 VITALS — BP 98/62 | HR 77 | Ht 63.5 in | Wt 197.6 lb

## 2018-11-02 DIAGNOSIS — I5022 Chronic systolic (congestive) heart failure: Secondary | ICD-10-CM | POA: Diagnosis not present

## 2018-11-02 DIAGNOSIS — Z95810 Presence of automatic (implantable) cardiac defibrillator: Secondary | ICD-10-CM | POA: Diagnosis not present

## 2018-11-02 DIAGNOSIS — I471 Supraventricular tachycardia, unspecified: Secondary | ICD-10-CM

## 2018-11-02 LAB — CUP PACEART INCLINIC DEVICE CHECK
Battery Remaining Longevity: 43 mo
Battery Voltage: 2.96 V
Brady Statistic AP VP Percent: 0.03 %
Brady Statistic AP VS Percent: 22.89 %
Brady Statistic AS VP Percent: 0.03 %
Brady Statistic AS VS Percent: 77.06 %
Brady Statistic RA Percent Paced: 22.11 %
Brady Statistic RV Percent Paced: 0.05 %
Date Time Interrogation Session: 20200724143817
HighPow Impedance: 91 Ohm
Implantable Lead Implant Date: 20131212
Implantable Lead Implant Date: 20131212
Implantable Lead Location: 753859
Implantable Lead Location: 753860
Implantable Lead Model: 5076
Implantable Lead Model: 6935
Implantable Pulse Generator Implant Date: 20131212
Lead Channel Impedance Value: 513 Ohm
Lead Channel Impedance Value: 722 Ohm
Lead Channel Impedance Value: 893 Ohm
Lead Channel Pacing Threshold Amplitude: 0.375 V
Lead Channel Pacing Threshold Amplitude: 0.5 V
Lead Channel Pacing Threshold Pulse Width: 0.4 ms
Lead Channel Pacing Threshold Pulse Width: 0.4 ms
Lead Channel Sensing Intrinsic Amplitude: 12.375 mV
Lead Channel Sensing Intrinsic Amplitude: 3.125 mV
Lead Channel Sensing Intrinsic Amplitude: 4.375 mV
Lead Channel Sensing Intrinsic Amplitude: 9.375 mV
Lead Channel Setting Pacing Amplitude: 2 V
Lead Channel Setting Pacing Amplitude: 2.5 V
Lead Channel Setting Pacing Pulse Width: 0.4 ms
Lead Channel Setting Sensing Sensitivity: 0.3 mV

## 2018-11-02 NOTE — Progress Notes (Signed)
EPIC Encounter for ICM Monitoring  Patient Name: Paula Booker is a 72 y.o. female Date: 11/02/2018 Primary Care Physican: [name], MD Primary Cardiologist:[place] Electrophysiologist: Lovena Le 08/21/2018 Weight: 195 lbs 10/24/2018 Weight: 196 lbs  Observation   Possible OptiVol fluid accumulation: 14-Oct-2018 -- 30-Oct-2018.  Transmission reviewed.  Optivol thoracic impedance returned to normal after taking extra Furosemide x 2 days.   Thoracic impedance shows more days within the last year with possible fluid accumulation versus days at baseline normal.    Prescribed:Furosemide40 mg 2 tablets(80 mg total)daily.  Labs: 05/04/2018 Creatinine 0.95, BUN 18, Potassium 3.7, Sodium 134, GFR 60  Recommendations: None   Follow-up plan: ICM clinic phone appointment on8/17/2020 to recheck fluid levels.    Copy of ICM check sent to Dr.Taylor.  3 month ICM trend: 11/02/2018    1 Year ICM trend:       [name], RN 11/02/2018 7:43 AM

## 2018-11-02 NOTE — Patient Instructions (Signed)
Medication Instructions:  Your physician recommends that you continue on your current medications as directed. Please refer to the Current Medication list given to you today.  Labwork: None ordered.  Testing/Procedures: None ordered.  Follow-Up: Your physician wants you to follow-up in: one year with Dr. Lovena Le.   You will receive a reminder letter in the mail two months in advance. If you don't receive a letter, please call our office to schedule the follow-up appointment.  Remote monitoring is used to monitor your ICD from home. This monitoring reduces the number of office visits required to check your device to one time per year. It allows [place] to keep an eye on the functioning of your device to ensure it is working properly. You are scheduled for a device check from home on 11/26/2018. You may send your transmission at any time that day. If you have a wireless device, the transmission will be sent automatically. After your physician reviews your transmission, you will receive a postcard with your next transmission date.  Any Other Special Instructions Will Be Listed Below (If Applicable).  If you need a refill on your cardiac medications before your next appointment, please call your pharmacy.

## 2018-11-02 NOTE — Progress Notes (Signed)
HPI Mrs. Rideout returns today for ongoing evaluation. She has a h/o ICM, chronic systolic heart failure, syncope, and a WPW pattern on her ECG. She underwent EP study which demonstrated no inducible arrhythmias. She underwent ICD insertion. She has a h/o generalized weakness.  She has had trouble with insomnia. Her energy level has improved.  Allergies  Allergen Reactions  . Ivp Dye [Iodinated Diagnostic Agents] Hives and Other (See Comments)  . Penicillins Anaphylaxis    Has patient had a PCN reaction causing immediate rash, facial/tongue/throat swelling, SOB or lightheadedness with hypotension: No Has patient had a PCN reaction causing severe rash involving mucus membranes or skin necrosis: No Has patient had a PCN reaction that required hospitalization No Has patient had a PCN reaction occurring within the last 10 years:No If all of the above answers are "NO", then may proceed with Cephalosporin use.  . Sulfonamide Derivatives Other (See Comments)    Migraines  . Tessalon [Benzonatate] Rash and Hives    Severe rash  . Sulfa Antibiotics Other (See Comments)    Migraine  . Rosuvastatin Calcium Other (See Comments)    Unknown reaction  . Onion Other (See Comments)    Raw onions cause migraines     Current Outpatient Medications  Medication Sig Dispense Refill  . albuterol (PROVENTIL HFA;VENTOLIN HFA) 108 (90 Base) MCG/ACT inhaler Inhale 2 puffs into the lungs every 6 (six) hours as needed for wheezing. For wheezing. Use 2 puffs 3 times daily x5 days and then every 6 hours as needed. (Patient taking differently: Inhale 2 puffs into the lungs every 6 (six) hours as needed for wheezing. ) 1 Inhaler 5  . albuterol (PROVENTIL HFA;VENTOLIN HFA) 108 (90 Base) MCG/ACT inhaler Inhale 2 puffs into the lungs every 6 (six) hours as needed for wheezing. 1 Inhaler 5  . alendronate (FOSAMAX) 70 MG tablet Take 70 mg by mouth every Sunday.     . carvedilol (COREG) 12.5 MG tablet TAKE 1 TABLET  BY MOUTH TWICE DAILY WITH A MEAL 180 tablet 3  . cetirizine (ZYRTEC) 10 MG tablet Take 10 mg by mouth daily.    . Cholecalciferol (VITAMIN D3) 5000 units CAPS Take 5,000 Units by mouth daily.    . citalopram (CELEXA) 20 MG tablet Take 20 mg by mouth daily.     . Cyanocobalamin (VITAMIN B-12) 5000 MCG LOZG Take 5,000 mcg by mouth daily.    . fluticasone (FLONASE) 50 MCG/ACT nasal spray Place 2 sprays into both nostrils daily.    . fluticasone furoate-vilanterol (BREO ELLIPTA) 200-25 MCG/INH AEPB Inhale 1 puff into the lungs daily. 60 each 6  . furosemide (LASIX) 40 MG tablet TAKE 1 TO 2 TABLETS BY MOUTH DAILY AS DIRECTED FOR EDEMA, SHORTNESS OF BREATH AND /OR WEIGHT GAIN 180 tablet 2  . ipratropium-albuterol (DUONEB) 0.5-2.5 (3) MG/3ML SOLN Take 3 mLs by nebulization every 4 (four) hours as needed. 360 mL 1  . losartan (COZAAR) 25 MG tablet TAKE 1 TABLET BY MOUTH TWICE DAILY (Patient taking differently: Take 25 mg by mouth 2 (two) times daily. ) 180 tablet 3  . pantoprazole (PROTONIX) 40 MG tablet Take 40 mg by mouth daily.  0  . spironolactone (ALDACTONE) 25 MG tablet TAKE 1 TABLET BY MOUTH ONCE DAILY 90 tablet 3   No current facility-administered medications for this visit.      Past Medical History:  Diagnosis Date  . Anemia    as a child  . Anxiety   .  Arthritis    "hands and knees" (03/22/2012)  . Asthma   . CHF (congestive heart failure) ([place]) 6/13  . Depression   . GERD (gastroesophageal reflux disease)   . Head injury, acute, with loss of consciousness ([place])   . Hypertension   . ICD (implantable cardiac defibrillator) in place Dec 2013  . Migraines    migraines   . NICM (nonischemic cardiomyopathy) ([place])   . Obesity (BMI 30-39.9)    negative sleep study 2014  . OSA (obstructive sleep apnea) 05/27/2018   Mild with AHI 6/hr  . Pacemaker   . Pneumonia    hx  . Shortness of breath    "@ any time I can get SOB" (03/22/2012)  . Skin cancer 1999  . WPW  (Wolff-Parkinson-White syndrome)     ROS:   All systems reviewed and negative except as noted in the HPI.   Past Surgical History:  Procedure Laterality Date  . CARDIAC CATHETERIZATION  09/18/11   no significant CAD  . CARDIAC DEFIBRILLATOR PLACEMENT  03/22/2012   DDD  . CHOLECYSTECTOMY  ~ 2003  . IMPLANTABLE CARDIOVERTER DEFIBRILLATOR IMPLANT N/A 03/22/2012   Procedure: IMPLANTABLE CARDIOVERTER DEFIBRILLATOR IMPLANT;  Surgeon: Evans Lance, MD;  Location[facility] CATH LAB;  Service: Cardiovascular;  Laterality: N/A;  . KNEE ARTHROSCOPY Left 11/29/2017   Procedure: ARTHROSCOPY LEFT KNEE;  Surgeon: Dorna Leitz, MD;  Location: [facility] ORS;  Service: Orthopedics;  Laterality: Left;  . KYPHOPLASTY N/A 11/27/2013   Procedure: KYPHOPLASTY;  Surgeon: Sinclair Ship, MD;  Location: [place];  Service: Orthopedics;  Laterality: N/A;  T9, T11 kyphoplasty  . KYPHOPLASTY N/A 06/05/2014   Procedure: KYPHOPLASTY;  Surgeon: Sinclair Ship, MD;  Location: [place];  Service: Orthopedics;  Laterality: N/A;  T7 kyphoplasty  . MOHS SURGERY  06/2011   "forehead" (03/22/2012)  . SKIN CANCER EXCISION  1999   "top of my head and my right back shoulder"  . SKIN CANCER EXCISION     back  . SKIN CANCER EXCISION     face  . SUPRAVENTRICULAR TACHYCARDIA ABLATION  03/22/2012   unable to induce VT/RN report 03/22/2012  . SUPRAVENTRICULAR TACHYCARDIA ABLATION N/A 03/22/2012   Procedure: SUPRAVENTRICULAR TACHYCARDIA ABLATION;  Surgeon: Evans Lance, MD;  Location: [facility] CATH LAB;  Service: Cardiovascular;  Laterality: N/A;  . TONSILLECTOMY AND ADENOIDECTOMY  1950  . TUBAL LIGATION  1978     Family History  Problem Relation Age of Onset  . Congestive Heart Failure Brother   . Atrial fibrillation Brother   . Congestive Heart Failure Mother        died 42  . Kidney Stones Mother   . Deafness Mother   . Atrial fibrillation Mother   . Congestive Heart Failure Father   . Deafness Father   . Healthy Sister    . Healthy Sister   . Healthy Sister   . Tuberculosis Paternal Grandfather      Social History   Socioeconomic History  . Marital status: Divorced    Spouse name: Not on file  . Number of children: 3  . Years of education: Not on file  . Highest education level: Not on file  Occupational History  . Occupation: retired    Comment: Mining engineer  . Financial resource strain: Not on file  . Food insecurity    Worry: Not on file    Inability: Not on file  . Transportation needs    Medical: Not on file  Non-medical: Not on file  Tobacco Use  . Smoking status: Never Smoker  . Smokeless tobacco: Never Used  Substance and Sexual Activity  . Alcohol use: No  . Drug use: No  . Sexual activity: Not on file  Lifestyle  . Physical activity    Days per week: Not on file    Minutes per session: Not on file  . Stress: Not on file  Relationships  . Social Herbalist on phone: Not on file    Gets together: Not on file    Attends religious service: Not on file    Active member of club or organization: Not on file    Attends meetings of clubs or organizations: Not on file    Relationship status: Not on file  . Intimate partner violence    Fear of current or ex partner: Not on file    Emotionally abused: Not on file    Physically abused: Not on file    Forced sexual activity: Not on file  Other Topics Concern  . Not on file  Social History Narrative   Divorced, 3 children, 9 grandchildren     BP 98/62   Pulse 77   Ht 5' 3.5" (1.613 m)   Wt 197 lb 9.6 oz (89.6 kg)   SpO2 95%   BMI 34.45 kg/m   Physical Exam:  Well appearing NAD HEENT: Unremarkable Neck:  No JVD, no thyromegally Lymphatics:  No adenopathy Back:  No CVA tenderness Lungs:  Clear with no wheezes HEART:  Regular rate rhythm, no murmurs, no rubs, no clicks Abd:  soft, positive bowel sounds, no organomegally, no rebound, no guarding Ext:  2 plus pulses, no edema, no cyanosis, no  clubbing Skin:  No rashes no nodules Neuro:  CN II through XII intact, motor grossly intact  DEVICE  Normal device function.  See PaceArt for details.   Assess/Plan: 1. Chronic systolic heart failure - her exam and fluid index demonstrate that she is well compensated. She is encouraged to continue a low sodium diet. 2. ICD - her medtronic device is working normally. We will recheck in several months. 3. Obesity - I have encouraged her to lose weight.   Mikle Bosworth.D.

## 2018-11-11 ENCOUNTER — Emergency Department (HOSPITAL_COMMUNITY): Payer: Medicare HMO

## 2018-11-11 ENCOUNTER — Other Ambulatory Visit: Payer: Self-pay

## 2018-11-11 ENCOUNTER — Encounter (HOSPITAL_COMMUNITY): Payer: Self-pay | Admitting: *Deleted

## 2018-11-11 ENCOUNTER — Observation Stay (HOSPITAL_COMMUNITY)
Admission: EM | Admit: 2018-11-11 | Discharge: 2018-11-12 | Disposition: A | Discharge status: Home / Self Care | Payer: Medicare HMO | Attending: Internal Medicine | Admitting: Internal Medicine

## 2018-11-11 DIAGNOSIS — Z8249 Family history of ischemic heart disease and other diseases of the circulatory system: Secondary | ICD-10-CM | POA: Diagnosis not present

## 2018-11-11 DIAGNOSIS — R110 Nausea: Secondary | ICD-10-CM | POA: Diagnosis not present

## 2018-11-11 DIAGNOSIS — I456 Pre-excitation syndrome: Secondary | ICD-10-CM | POA: Diagnosis not present

## 2018-11-11 DIAGNOSIS — I10 Essential (primary) hypertension: Secondary | ICD-10-CM | POA: Diagnosis present

## 2018-11-11 DIAGNOSIS — Z79899 Other long term (current) drug therapy: Secondary | ICD-10-CM | POA: Insufficient documentation

## 2018-11-11 DIAGNOSIS — I110 Hypertensive heart disease with heart failure: Secondary | ICD-10-CM | POA: Diagnosis not present

## 2018-11-11 DIAGNOSIS — F419 Anxiety disorder, unspecified: Secondary | ICD-10-CM | POA: Insufficient documentation

## 2018-11-11 DIAGNOSIS — Z85828 Personal history of other malignant neoplasm of skin: Secondary | ICD-10-CM | POA: Insufficient documentation

## 2018-11-11 DIAGNOSIS — M1990 Unspecified osteoarthritis, unspecified site: Secondary | ICD-10-CM | POA: Insufficient documentation

## 2018-11-11 DIAGNOSIS — Z7951 Long term (current) use of inhaled steroids: Secondary | ICD-10-CM | POA: Diagnosis not present

## 2018-11-11 DIAGNOSIS — I5022 Chronic systolic (congestive) heart failure: Secondary | ICD-10-CM | POA: Diagnosis present

## 2018-11-11 DIAGNOSIS — Z6835 Body mass index (BMI) 35.0-35.9, adult: Secondary | ICD-10-CM | POA: Diagnosis not present

## 2018-11-11 DIAGNOSIS — K219 Gastro-esophageal reflux disease without esophagitis: Secondary | ICD-10-CM | POA: Diagnosis not present

## 2018-11-11 DIAGNOSIS — R072 Precordial pain: Secondary | ICD-10-CM | POA: Diagnosis not present

## 2018-11-11 DIAGNOSIS — I491 Atrial premature depolarization: Secondary | ICD-10-CM | POA: Diagnosis not present

## 2018-11-11 DIAGNOSIS — Z95810 Presence of automatic (implantable) cardiac defibrillator: Secondary | ICD-10-CM | POA: Insufficient documentation

## 2018-11-11 DIAGNOSIS — E669 Obesity, unspecified: Secondary | ICD-10-CM | POA: Diagnosis not present

## 2018-11-11 DIAGNOSIS — I5043 Acute on chronic combined systolic (congestive) and diastolic (congestive) heart failure: Secondary | ICD-10-CM | POA: Diagnosis not present

## 2018-11-11 DIAGNOSIS — Z1159 Encounter for screening for other viral diseases: Secondary | ICD-10-CM | POA: Insufficient documentation

## 2018-11-11 DIAGNOSIS — F329 Major depressive disorder, single episode, unspecified: Secondary | ICD-10-CM | POA: Insufficient documentation

## 2018-11-11 DIAGNOSIS — R079 Chest pain, unspecified: Secondary | ICD-10-CM | POA: Diagnosis present

## 2018-11-11 DIAGNOSIS — J45909 Unspecified asthma, uncomplicated: Secondary | ICD-10-CM | POA: Insufficient documentation

## 2018-11-11 DIAGNOSIS — I428 Other cardiomyopathies: Secondary | ICD-10-CM | POA: Diagnosis not present

## 2018-11-11 DIAGNOSIS — Z20828 Contact with and (suspected) exposure to other viral communicable diseases: Secondary | ICD-10-CM | POA: Diagnosis not present

## 2018-11-11 DIAGNOSIS — F32A Depression, unspecified: Secondary | ICD-10-CM | POA: Diagnosis present

## 2018-11-11 DIAGNOSIS — G43909 Migraine, unspecified, not intractable, without status migrainosus: Secondary | ICD-10-CM | POA: Diagnosis not present

## 2018-11-11 DIAGNOSIS — G4733 Obstructive sleep apnea (adult) (pediatric): Secondary | ICD-10-CM | POA: Insufficient documentation

## 2018-11-11 DIAGNOSIS — I959 Hypotension, unspecified: Secondary | ICD-10-CM | POA: Diagnosis not present

## 2018-11-11 DIAGNOSIS — R0602 Shortness of breath: Secondary | ICD-10-CM | POA: Diagnosis not present

## 2018-11-11 DIAGNOSIS — I447 Left bundle-branch block, unspecified: Secondary | ICD-10-CM | POA: Diagnosis not present

## 2018-11-11 LAB — CBC
HCT: 40.9 % (ref 36.0–46.0)
Hemoglobin: 13.6 g/dL (ref 12.0–15.0)
MCH: 30.4 pg (ref 26.0–34.0)
MCHC: 33.3 g/dL (ref 30.0–36.0)
MCV: 91.3 fL (ref 80.0–100.0)
Platelets: 281 10*3/uL (ref 150–400)
RBC: 4.48 MIL/uL (ref 3.87–5.11)
RDW: 12.6 % (ref 11.5–15.5)
WBC: 8.8 10*3/uL (ref 4.0–10.5)
nRBC: 0 % (ref 0.0–0.2)

## 2018-11-11 LAB — BASIC METABOLIC PANEL
Anion gap: 12 (ref 5–15)
BUN: 18 mg/dL (ref 8–23)
CO2: 22 mmol/L (ref 22–32)
Calcium: 9.5 mg/dL (ref 8.9–10.3)
Chloride: 103 mmol/L (ref 98–111)
Creatinine, Ser: 0.83 mg/dL (ref 0.44–1.00)
GFR calc Af Amer: >60 mL/min (ref >60)
GFR calc non Af Amer: >60 mL/min (ref >60)
Glucose, Bld: 158 mg/dL — ABNORMAL HIGH (ref 70–99)
Potassium: 3.7 mmol/L (ref 3.5–5.1)
Sodium: 137 mmol/L (ref 135–145)

## 2018-11-11 LAB — TROPONIN I (HIGH SENSITIVITY)
Troponin I (High Sensitivity): 6 ng/L (ref <18)
Troponin I (High Sensitivity): 5 ng/L (ref <18)
Troponin I (High Sensitivity): 5 ng/L (ref <18)
Troponin I (High Sensitivity): 6 ng/L (ref <18)

## 2018-11-11 LAB — D-DIMER, QUANTITATIVE: D-Dimer, Quant: 1.25 ug/mL-FEU — ABNORMAL HIGH (ref 0.00–0.50)

## 2018-11-11 LAB — MAGNESIUM: Magnesium: 1.9 mg/dL (ref 1.7–2.4)

## 2018-11-11 LAB — BRAIN NATRIURETIC PEPTIDE: B Natriuretic Peptide: 55.7 pg/mL (ref 0.0–100.0)

## 2018-11-11 MED ORDER — SODIUM CHLORIDE 0.9% FLUSH: 3.0000 mL | Freq: Once | INTRAVENOUS | Status: DC

## 2018-11-11 MED ORDER — LORATADINE 10 MG PO TABS
10.0000 mg | ORAL_TABLET | Freq: Every day | ORAL | Status: DC
  Administered 2018-11-12: 10 mg via ORAL
  Filled 2018-11-11: qty 1

## 2018-11-11 MED ORDER — VITAMIN D 25 MCG (1000 UNIT) PO TABS
5000.0000 [IU] | ORAL_TABLET | Freq: Every day | ORAL | Status: DC
  Administered 2018-11-12: 5000 [IU] via ORAL
  Filled 2018-11-11: qty 5

## 2018-11-11 MED ORDER — IPRATROPIUM-ALBUTEROL 0.5-2.5 (3) MG/3ML IN SOLN: 3.0000 mL | RESPIRATORY_TRACT | Status: DC | PRN

## 2018-11-11 MED ORDER — FLUTICASONE PROPIONATE 50 MCG/ACT NA SUSP
2.0000 | Freq: Every day | NASAL | Status: DC
  Administered 2018-11-12: 2 via NASAL
  Filled 2018-11-11: qty 16

## 2018-11-11 MED ORDER — POTASSIUM CHLORIDE CRYS ER 20 MEQ PO TBCR: 40.0000 meq | EXTENDED_RELEASE_TABLET | Freq: Once | ORAL | Status: DC

## 2018-11-11 MED ORDER — SPIRONOLACTONE 25 MG PO TABS
25.0000 mg | ORAL_TABLET | Freq: Every day | ORAL | Status: DC
  Administered 2018-11-12: 25 mg via ORAL
  Filled 2018-11-11: qty 1

## 2018-11-11 MED ORDER — VITAMIN B-12 1000 MCG PO TABS
5000.0000 ug | ORAL_TABLET | Freq: Every day | ORAL | Status: DC
  Administered 2018-11-12: 5000 ug via ORAL
  Filled 2018-11-11: qty 5

## 2018-11-11 MED ORDER — DIPHENHYDRAMINE HCL 50 MG/ML IJ SOLN
50.0000 mg | Freq: Once | INTRAMUSCULAR | Status: AC
  Administered 2018-11-12: 50 mg via INTRAVENOUS
  Filled 2018-11-11: qty 1

## 2018-11-11 MED ORDER — PANTOPRAZOLE SODIUM 40 MG PO TBEC
40.0000 mg | DELAYED_RELEASE_TABLET | Freq: Every day | ORAL | Status: DC
  Administered 2018-11-12: 40 mg via ORAL
  Filled 2018-11-11: qty 1

## 2018-11-11 MED ORDER — HYDROCORTISONE NA SUCCINATE PF 250 MG IJ SOLR
200.0000 mg | Freq: Once | INTRAMUSCULAR | Status: AC
  Administered 2018-11-12: 200 mg via INTRAVENOUS
  Filled 2018-11-11 (×2): qty 200

## 2018-11-11 MED ORDER — ALBUTEROL SULFATE (2.5 MG/3ML) 0.083% IN NEBU: 3.0000 mL | INHALATION_SOLUTION | Freq: Four times a day (QID) | RESPIRATORY_TRACT | Status: DC | PRN

## 2018-11-11 MED ORDER — FLUTICASONE FUROATE-VILANTEROL 200-25 MCG/INH IN AEPB
1.0000 | INHALATION_SPRAY | Freq: Every day | RESPIRATORY_TRACT | Status: DC
  Administered 2018-11-12: 1 via RESPIRATORY_TRACT
  Filled 2018-11-11: qty 28

## 2018-11-11 MED ORDER — DIPHENHYDRAMINE HCL 25 MG PO CAPS: 50.0000 mg | ORAL_CAPSULE | Freq: Once | ORAL | Status: AC

## 2018-11-11 MED ORDER — CARVEDILOL 12.5 MG PO TABS
12.5000 mg | ORAL_TABLET | Freq: Two times a day (BID) | ORAL | Status: DC
  Administered 2018-11-12: 12.5 mg via ORAL
  Filled 2018-11-11: qty 1

## 2018-11-11 MED ORDER — CITALOPRAM HYDROBROMIDE 20 MG PO TABS
20.0000 mg | ORAL_TABLET | Freq: Every day | ORAL | Status: DC
  Administered 2018-11-12: 10:00:00 20 mg via ORAL
  Filled 2018-11-11: qty 1

## 2018-11-11 MED ORDER — LOSARTAN POTASSIUM 25 MG PO TABS
25.0000 mg | ORAL_TABLET | Freq: Two times a day (BID) | ORAL | Status: DC
  Administered 2018-11-11 – 2018-11-12 (×2): 25 mg via ORAL
  Filled 2018-11-11 (×2): qty 1

## 2018-11-11 MED ORDER — ADULT MULTIVITAMIN W/MINERALS CH
1.0000 | ORAL_TABLET | Freq: Every day | ORAL | Status: DC
  Administered 2018-11-12: 11:00:00 1 via ORAL
  Filled 2018-11-11: qty 1

## 2018-11-11 MED ORDER — ACETAMINOPHEN 650 MG RE SUPP: 650.0000 mg | Freq: Four times a day (QID) | RECTAL | Status: DC | PRN

## 2018-11-11 MED ORDER — ACETAMINOPHEN 325 MG PO TABS: 650.0000 mg | ORAL_TABLET | Freq: Four times a day (QID) | ORAL | Status: DC | PRN

## 2018-11-11 NOTE — ED Notes (Signed)
Admitting Dr. Rathore at bedside.  

## 2018-11-11 NOTE — ED Provider Notes (Signed)
[facility] EMERGENCY DEPARTMENT Provider Note   CSN: [id number] Arrival date & time: 11/11/18  1327     History   Chief Complaint Chief Complaint  Patient presents with  . Chest Pain    HPI Paula Booker is a 73 y.o. female.  HPI: A 73 year old patient with a history of hypertension and obesity presents for evaluation of chest pain. Initial onset of pain was more than 6 hours ago. The patient's chest pain is described as heaviness/pressure/tightness and is not worse with exertion. The patient complains of nausea. The patient's chest pain is middle- or left-sided, is not well-localized, is not sharp and does not radiate to the arms/jaw/neck. The patient denies diaphoresis. The patient has no history of stroke, has no history of peripheral artery disease, has not smoked in the past 90 days, denies any history of treated diabetes, has no relevant family history of coronary artery disease (first degree relative at less than age 49) and has no history of hypercholesterolemia.   Patient with acute onset of substernal chest pain at 1230 this afternoon.  At times it did go through to the back associated with nausea no vomiting some shortness of breath.  Patient got very alarmed because pain was very severe and she called 911.  Patient sometimes gets pain but never had anything quite this severe.  Now it has improved some.  Patient's past medical history besides already included in the heart pathway is significant that she has an ICD she has had known to have nonischemic cardiomyopathy known to have CHF.  Patient last had cardiac cath in 2013 without any significant disease.     Past Medical History:  Diagnosis Date  . Anemia    as a child  . Anxiety   . Arthritis    "hands and knees" (03/22/2012)  . Asthma   . CHF (congestive heart failure) ([place]) 6/13  . Depression   . GERD (gastroesophageal reflux disease)   . Head injury, acute, with loss of consciousness ([place])   .  Hypertension   . ICD (implantable cardiac defibrillator) in place Dec 2013  . Migraines    migraines   . NICM (nonischemic cardiomyopathy) ([place])   . Obesity (BMI 30-39.9)    negative sleep study 2014  . OSA (obstructive sleep apnea) 05/27/2018   Mild with AHI 6/hr  . Pacemaker   . Pneumonia    hx  . Shortness of breath    "@ any time I can get SOB" (03/22/2012)  . Skin cancer 1999  . WPW (Wolff-Parkinson-White syndrome)     Patient Active Problem List   Diagnosis Date Noted  . SVT (supraventricular tachycardia) ([place]) 11/02/2018  . ICD (implantable cardioverter-defibrillator) in place 11/02/2018  . OSA (obstructive sleep apnea) 05/27/2018  . Acute medial meniscus tear, left, initial encounter 11/29/2017  . Chondromalacia of left knee 11/29/2017  . Migraines 09/23/2017  . Anxiety 09/23/2017  . Depression 09/23/2017  . Pacemaker 09/23/2017  . Chronic systolic heart failure ([place]) 04/28/2015  . UTI (urinary tract infection) 03/22/2015  . Sepsis ([place]) 03/19/2015  . Acute kidney injury ([place]) 03/19/2015  . Diarrhea 03/18/2015  . CAP (community acquired pneumonia) 03/18/2015  . Nausea vomiting and diarrhea 03/18/2015  . Compression fracture 06/05/2014  . Essential hypertension 03/27/2014  . Chest pain 01/27/2014  . Normal coronary arteries 01/27/2014  . Obesity (BMI 30-39.9)   . ICD- MDT implanted Dec 2013 03/26/2013  . NICM (nonischemic cardiomyopathy) ([place]) 02/21/2013  . GERD (gastroesophageal reflux disease)  04/23/2012  . Asthma in adult, mild persistent, uncomplicated 34/74/2595  . Shortness of breath 04/23/2012  . Syncope 03/01/2012  . Acute on chronic systolic CHF (congestive heart failure) ([place]) 03/01/2012  . Type B WPW syndrome- RFA Dec 2013 03/01/2012    Past Surgical History:  Procedure Laterality Date  . CARDIAC CATHETERIZATION  09/18/11   no significant CAD  . CARDIAC DEFIBRILLATOR PLACEMENT  03/22/2012   DDD  . CHOLECYSTECTOMY  ~ 2003  . IMPLANTABLE  CARDIOVERTER DEFIBRILLATOR IMPLANT N/A 03/22/2012   Procedure: IMPLANTABLE CARDIOVERTER DEFIBRILLATOR IMPLANT;  Surgeon: Evans Lance, MD;  Location: [facility] CATH LAB;  Service: Cardiovascular;  Laterality: N/A;  . KNEE ARTHROSCOPY Left 11/29/2017   Procedure: ARTHROSCOPY LEFT KNEE;  Surgeon: Dorna Leitz, MD;  Location: [facility];  Service: Orthopedics;  Laterality: Left;  . KYPHOPLASTY N/A 11/27/2013   Procedure: KYPHOPLASTY;  Surgeon: Sinclair Ship, MD;  Location: [place];  Service: Orthopedics;  Laterality: N/A;  T9, T11 kyphoplasty  . KYPHOPLASTY N/A 06/05/2014   Procedure: KYPHOPLASTY;  Surgeon: Sinclair Ship, MD;  Location: [place];  Service: Orthopedics;  Laterality: N/A;  T7 kyphoplasty  . MOHS SURGERY  06/2011   "forehead" (03/22/2012)  . SKIN CANCER EXCISION  1999   "top of my head and my right back shoulder"  . SKIN CANCER EXCISION     back  . SKIN CANCER EXCISION     face  . SUPRAVENTRICULAR TACHYCARDIA ABLATION  03/22/2012   unable to induce VT/RN report 03/22/2012  . SUPRAVENTRICULAR TACHYCARDIA ABLATION N/A 03/22/2012   Procedure: SUPRAVENTRICULAR TACHYCARDIA ABLATION;  Surgeon: Evans Lance, MD;  Location: [facility] CATH LAB;  Service: Cardiovascular;  Laterality: N/A;  . TONSILLECTOMY AND ADENOIDECTOMY  1950  . TUBAL LIGATION  1978     OB History   No obstetric history on file.      Home Medications    Prior to Admission medications   Medication Sig Start Date End Date Taking? Authorizing Provider  albuterol (PROVENTIL HFA;VENTOLIN HFA) 108 (90 Base) MCG/ACT inhaler Inhale 2 puffs into the lungs every 6 (six) hours as needed for wheezing. For wheezing. Use 2 puffs 3 times daily x5 days and then every 6 hours as needed. Patient taking differently: Inhale 2 puffs into the lungs every 6 (six) hours as needed for wheezing.  01/18/18  Yes Mannam, Praveen, MD  alendronate (FOSAMAX) 70 MG tablet Take 70 mg by mouth every Sunday.    Yes [provider]   carvedilol (COREG) 12.5 MG tablet TAKE 1 TABLET BY MOUTH TWICE DAILY WITH A MEAL Patient taking differently: Take 12.5 mg by mouth 2 (two) times daily with a meal.  07/04/18  Yes Evans Lance, MD  cetirizine (ZYRTEC) 10 MG tablet Take 10 mg by mouth daily.   Yes [provider]  Cholecalciferol (VITAMIN D3) 5000 units CAPS Take 5,000 Units by mouth daily.   Yes [provider]  citalopram (CELEXA) 20 MG tablet Take 20 mg by mouth daily.    Yes [provider]  Cyanocobalamin (VITAMIN B-12) 5000 MCG LOZG Take 5,000 mcg by mouth daily.   Yes [provider]  fluticasone (FLONASE) 50 MCG/ACT nasal spray Place 2 sprays into both nostrils daily.   Yes [provider]  fluticasone furoate-vilanterol (BREO ELLIPTA) 200-25 MCG/INH AEPB Inhale 1 puff into the lungs daily. 01/18/18  Yes Mannam, Praveen, MD  furosemide (LASIX) 40 MG tablet TAKE 1 TO 2 TABLETS BY MOUTH DAILY AS DIRECTED FOR EDEMA, SHORTNESS  OF BREATH AND /OR WEIGHT GAIN Patient taking differently: Take 80-120 mg by mouth See admin instructions. Take 80 mg by mouth in the morning and an additional 40 mg once daily as needed for fluid retention 06/06/18  Yes Jettie Booze, MD  ipratropium-albuterol (DUONEB) 0.5-2.5 (3) MG/3ML SOLN Take 3 mLs by nebulization every 4 (four) hours as needed. Patient taking differently: Take 3 mLs by nebulization every 4 (four) hours as needed (for shortness of breath or wheezing).  10/09/17  Yes Lauraine Rinne, NP  losartan (COZAAR) 25 MG tablet TAKE 1 TABLET BY MOUTH TWICE DAILY Patient taking differently: Take 25 mg by mouth 2 (two) times daily.  04/09/18  Yes Jettie Booze, MD  Multiple Vitamins-Minerals (CENTRUM SILVER 50+WOMEN) TABS Take 1 tablet by mouth daily with breakfast.    Yes [provider]  pantoprazole (PROTONIX) 40 MG tablet Take 40 mg by mouth daily. 08/29/17  Yes [provider]  spironolactone (ALDACTONE) 25 MG tablet TAKE  1 TABLET BY MOUTH ONCE DAILY Patient taking differently: Take 25 mg by mouth daily.  05/07/18  Yes Jettie Booze, MD  albuterol (PROVENTIL HFA;VENTOLIN HFA) 108 (90 Base) MCG/ACT inhaler Inhale 2 puffs into the lungs every 6 (six) hours as needed for wheezing. Patient not taking: Reported on 11/11/2018 01/18/18   Marshell Garfinkel, MD    Family History Family History  Problem Relation Age of Onset  . Congestive Heart Failure Brother   . Atrial fibrillation Brother   . Congestive Heart Failure Mother        died 51  . Kidney Stones Mother   . Deafness Mother   . Atrial fibrillation Mother   . Congestive Heart Failure Father   . Deafness Father   . Healthy Sister   . Healthy Sister   . Healthy Sister   . Tuberculosis Paternal Grandfather     Social History Social History   Tobacco Use  . Smoking status: Never Smoker  . Smokeless tobacco: Never Used  Substance Use Topics  . Alcohol use: No  . Drug use: No     Allergies   Ivp dye [iodinated diagnostic agents], Penicillins, Sulfonamide derivatives, Tessalon [benzonatate], Sulfa antibiotics, Rosuvastatin calcium, and Onion   Review of Systems Review of Systems  Constitutional: Negative for chills and fever.  HENT: Negative for rhinorrhea and sore throat.   Eyes: Negative for visual disturbance.  Respiratory: Positive for shortness of breath. Negative for cough.   Cardiovascular: Positive for chest pain. Negative for leg swelling.  Gastrointestinal: Positive for nausea. Negative for abdominal pain, diarrhea and vomiting.  Genitourinary: Negative for dysuria.  Musculoskeletal: Negative for back pain and neck pain.  Skin: Negative for rash.  Neurological: Negative for dizziness, light-headedness and headaches.  Hematological: Does not bruise/bleed easily.  Psychiatric/Behavioral: Negative for confusion.     Physical Exam Updated Vital Signs BP (!) 117/53   Pulse (!) 58   Temp 98.1 F (36.7 C) (Oral)   Resp 17    Ht 1.6 m (5\' 3" )   Wt 89.8 kg   SpO2 94%   BMI 35.07 kg/m   Physical Exam Vitals signs and nursing note reviewed.  Constitutional:      General: She is not in acute distress.    Appearance: She is well-developed.  HENT:     Head: Normocephalic and atraumatic.  Eyes:     Extraocular Movements: Extraocular movements intact.     Conjunctiva/sclera: Conjunctivae normal.     Pupils: Pupils are equal,  round, and reactive to light.  Neck:     Musculoskeletal: Normal range of motion and neck supple.  Cardiovascular:     Rate and Rhythm: Normal rate and regular rhythm.     Heart sounds: No murmur.  Pulmonary:     Effort: Pulmonary effort is normal. No respiratory distress.     Breath sounds: Normal breath sounds.     Comments: Palpable ICD left chest Chest:     Chest wall: No tenderness.  Abdominal:     Palpations: Abdomen is soft.     Tenderness: There is no abdominal tenderness.  Musculoskeletal: Normal range of motion.        General: No swelling.  Skin:    General: Skin is warm and dry.  Neurological:     General: No focal deficit present.     Mental Status: She is alert and oriented to person, place, and time.      ED Treatments / Results  Labs (all labs ordered are listed, but only abnormal results are displayed) Labs Reviewed  BASIC METABOLIC PANEL - Abnormal; Notable for the following components:      Result Value   Glucose, Bld 158 (*)    All other components within normal limits  D-DIMER, QUANTITATIVE (NOT AT [facility]) - Abnormal; Notable for the following components:   D-Dimer, Quant 1.25 (*)    All other components within normal limits  CBC  BRAIN NATRIURETIC PEPTIDE  TROPONIN I (HIGH SENSITIVITY)  TROPONIN I (HIGH SENSITIVITY)  TROPONIN I (HIGH SENSITIVITY)    EKG EKG Interpretation  Date/Time:  Sunday November 11 2018 13:23:39 EDT Ventricular Rate:  71 PR Interval:  144 QRS Duration: 132 QT Interval:  476 QTC Calculation: 517 R Axis:   -8 Text  Interpretation:  Normal sinus rhythm Non-specific intra-ventricular conduction block Abnormal ECG ? [facility] Reconfirmed by Fredia Sorrow [phone]) on 11/11/2018 5:25:01 PM   Radiology Dg Chest 2 View  Result Date: 11/11/2018 CLINICAL DATA:  Chest pain EXAM: CHEST - 2 VIEW COMPARISON:  May 04, 2018 FINDINGS: The heart size and mediastinal contours are stable. Dual lead cardiac pacemaker is identified. Both lungs are clear. The visualized skeletal structures are stable. Patient status post prior vertebroplasty of thoracic vertebral bodies. IMPRESSION: No active cardiopulmonary disease. Electronically Signed   By: Abelardo Diesel M.D.   On: 11/11/2018 14:41    Procedures Procedures (including critical care time)  Medications Ordered in ED Medications  sodium chloride flush (NS) 0.9 % injection 3 mL (3 mLs Intravenous Not Given 11/11/18 1704)  hydrocortisone sodium succinate (SOLU-CORTEF) injection 200 mg (has no administration in time range)  diphenhydrAMINE (BENADRYL) capsule 50 mg (has no administration in time range)    Or  diphenhydrAMINE (BENADRYL) injection 50 mg (has no administration in time range)     Initial Impression / Assessment and Plan / ED Course  I have reviewed the triage vital signs and the nursing notes.  Pertinent labs & imaging results that were available during my care of the patient were reviewed by me and considered in my medical decision making (see chart for details).     HEAR Score: 5  Patient with a heart score but reassuring troponins.  But is having persistent pain.  The patient meets criteria for observation admission.  In addition d-dimer was elevated above 1.  Patient clinically symptoms did not seem to be drastically concerning for PE no tachycardia no oxygen sat problems.  But she did have pain that did go to the  back area.  Patient has allergy to IV dye is hives she will be premedicated CT angios ordered.  Discussed with hospitalist they plan on admitting.   Patient still not completely pain-free but pain is not as severe as it was initially where she said it was 12 out of 10.  Currently about 4 out of 10.  Final Clinical Impressions(s) / ED Diagnoses   Final diagnoses:  Precordial pain    ED Discharge Orders    None       Fredia Sorrow, MD 11/11/18 2206

## 2018-11-11 NOTE — ED Triage Notes (Signed)
On triage interview pt states she was playing on her ipad when pain started. States pain is midsternal and does not radiate. Skin w/d, resp e/u. Ate this am - eggs and grits.

## 2018-11-11 NOTE — ED Triage Notes (Signed)
To ED for eval of cp starting an hour pta. Brought to ED via [facility]. Given 324mg  asa and 2 nitro pta - with little relief.

## 2018-11-11 NOTE — H&P (Addendum)
History and Physical    Paula Booker PXT:[id number] DOB: Jan 09, 1946 DOA: 11/11/2018  PCP: [name], MD Patient coming from: Home  Chief Complaint: Chest pain  HPI: Paula Booker is a 73 y.o. female with medical history significant of NICM, chronic systolic congestive heart failure, syncope, WPW status post ICD, anemia, anxiety, asthma, depression, GERD, hypertension, obesity, mild OSA presenting to the hospital for evaluation of chest pain.  Patient received aspirin 324 mg and 2 nitro by EMS.  Patient states today she was attending church on her iPad around noontime.  She then suddenly felt nauseous and started having substernal stabbing chest pain which radiated to her back.  She thought it was related to indigestion so she had some milk which did not help.  She also felt short of breath at that time and was sweating when EMTs arrived.  Denies any history of exertional chest pain/pressure.  Denies any history of blood clots.  Denies noticing any calf pain, redness, or swelling.  States contrast dye causes her to have hives.  Denies any history of anaphylaxis from contrast.  States in the past she has done well and not had any allergic reaction when given Benadryl prior to IV contrast.  ED Course: Hemodynamically stable. Not tachycardic or hypoxic.  High-sensitivity troponin checked twice negative.  EKG without acute ischemic changes.  BNP normal.  D-dimer 1.25.  Chest x-ray showing no active cardiopulmonary disease.  CT angiogram to rule out PE pending.  Solu-Cortef 200 mg and Benadryl ordered due to history of iodinated contrast allergy (hives).   Review of Systems:  All systems reviewed and apart from history of presenting illness, are negative.  Past Medical History:  Diagnosis Date  . Anemia    as a child  . Anxiety   . Arthritis    "hands and knees" (03/22/2012)  . Asthma   . CHF (congestive heart failure) ([place]) 6/13  . Depression   . GERD (gastroesophageal reflux disease)   .  Head injury, acute, with loss of consciousness ([place])   . Hypertension   . ICD (implantable cardiac defibrillator) in place Dec 2013  . Migraines    migraines   . NICM (nonischemic cardiomyopathy) ([place])   . Obesity (BMI 30-39.9)    negative sleep study 2014  . OSA (obstructive sleep apnea) 05/27/2018   Mild with AHI 6/hr  . Pacemaker   . Pneumonia    hx  . Shortness of breath    "@ any time I can get SOB" (03/22/2012)  . Skin cancer 1999  . WPW (Wolff-Parkinson-White syndrome)     Past Surgical History:  Procedure Laterality Date  . CARDIAC CATHETERIZATION  09/18/11   no significant CAD  . CARDIAC DEFIBRILLATOR PLACEMENT  03/22/2012   DDD  . CHOLECYSTECTOMY  ~ 2003  . IMPLANTABLE CARDIOVERTER DEFIBRILLATOR IMPLANT N/A 03/22/2012   Procedure: IMPLANTABLE CARDIOVERTER DEFIBRILLATOR IMPLANT;  Surgeon: Evans Lance, MD;  Location: [facility] CATH LAB;  Service: Cardiovascular;  Laterality: N/A;  . KNEE ARTHROSCOPY Left 11/29/2017   Procedure: ARTHROSCOPY LEFT KNEE;  Surgeon: Dorna Leitz, MD;  Location: [facility] ORS;  Service: Orthopedics;  Laterality: Left;  . KYPHOPLASTY N/A 11/27/2013   Procedure: KYPHOPLASTY;  Surgeon: Sinclair Ship, MD;  Location: [place];  Service: Orthopedics;  Laterality: N/A;  T9, T11 kyphoplasty  . KYPHOPLASTY N/A 06/05/2014   Procedure: KYPHOPLASTY;  Surgeon: Sinclair Ship, MD;  Location: [place];  Service: Orthopedics;  Laterality: N/A;  T7 kyphoplasty  . MOHS SURGERY  06/2011   "  forehead" (03/22/2012)  . SKIN CANCER EXCISION  1999   "top of my head and my right back shoulder"  . SKIN CANCER EXCISION     back  . SKIN CANCER EXCISION     face  . SUPRAVENTRICULAR TACHYCARDIA ABLATION  03/22/2012   unable to induce VT/RN report 03/22/2012  . SUPRAVENTRICULAR TACHYCARDIA ABLATION N/A 03/22/2012   Procedure: SUPRAVENTRICULAR TACHYCARDIA ABLATION;  Surgeon: Evans Lance, MD;  Location: [facility] CATH LAB;  Service: Cardiovascular;  Laterality: N/A;  . TONSILLECTOMY  AND ADENOIDECTOMY  1950  . TUBAL LIGATION  1978     reports that she has never smoked. She has never used smokeless tobacco. She reports that she does not drink alcohol or use drugs.  Allergies  Allergen Reactions  . Ivp Dye [Iodinated Diagnostic Agents] Hives and Other (See Comments)  . Penicillins Anaphylaxis    Has patient had a PCN reaction causing immediate rash, facial/tongue/throat swelling, SOB or lightheadedness with hypotension: Yes Has patient had a PCN reaction causing severe rash involving mucus membranes or skin necrosis: No Has patient had a PCN reaction that required hospitalization No Has patient had a PCN reaction occurring within the last 10 years: No If all of the above answers are "NO", then may proceed with Cephalosporin use.  . Sulfonamide Derivatives Other (See Comments)    Migraines  . Tessalon [Benzonatate] Hives and Rash    Severe rash  . Sulfa Antibiotics Other (See Comments)    Migraines  . Rosuvastatin Calcium Other (See Comments)    Reaction not recalled by the patient  . Onion Other (See Comments)    Raw onions cause migraines    Family History  Problem Relation Age of Onset  . Congestive Heart Failure Brother   . Atrial fibrillation Brother   . Congestive Heart Failure Mother        died 77  . Kidney Stones Mother   . Deafness Mother   . Atrial fibrillation Mother   . Congestive Heart Failure Father   . Deafness Father   . Healthy Sister   . Healthy Sister   . Healthy Sister   . Tuberculosis Paternal Grandfather     Prior to Admission medications   Medication Sig Start Date End Date Taking? Authorizing Provider  albuterol (PROVENTIL HFA;VENTOLIN HFA) 108 (90 Base) MCG/ACT inhaler Inhale 2 puffs into the lungs every 6 (six) hours as needed for wheezing. For wheezing. Use 2 puffs 3 times daily x5 days and then every 6 hours as needed. Patient taking differently: Inhale 2 puffs into the lungs every 6 (six) hours as needed for wheezing.   01/18/18  Yes Mannam, Praveen, MD  alendronate (FOSAMAX) 70 MG tablet Take 70 mg by mouth every Sunday.    Yes [provider]  carvedilol (COREG) 12.5 MG tablet TAKE 1 TABLET BY MOUTH TWICE DAILY WITH A MEAL Patient taking differently: Take 12.5 mg by mouth 2 (two) times daily with a meal.  07/04/18  Yes Evans Lance, MD  cetirizine (ZYRTEC) 10 MG tablet Take 10 mg by mouth daily.   Yes [provider]  Cholecalciferol (VITAMIN D3) 5000 units CAPS Take 5,000 Units by mouth daily.   Yes [provider]  citalopram (CELEXA) 20 MG tablet Take 20 mg by mouth daily.    Yes [provider]  Cyanocobalamin (VITAMIN B-12) 5000 MCG LOZG Take 5,000 mcg by mouth daily.   Yes [provider]  fluticasone (FLONASE) 50 MCG/ACT nasal spray Place 2 sprays  into both nostrils daily.   Yes [provider]  fluticasone furoate-vilanterol (BREO ELLIPTA) 200-25 MCG/INH AEPB Inhale 1 puff into the lungs daily. 01/18/18  Yes Mannam, Praveen, MD  furosemide (LASIX) 40 MG tablet TAKE 1 TO 2 TABLETS BY MOUTH DAILY AS DIRECTED FOR EDEMA, SHORTNESS OF BREATH AND /OR WEIGHT GAIN Patient taking differently: Take 80-120 mg by mouth See admin instructions. Take 80 mg by mouth in the morning and an additional 40 mg once daily as needed for fluid retention 06/06/18  Yes Jettie Booze, MD  ipratropium-albuterol (DUONEB) 0.5-2.5 (3) MG/3ML SOLN Take 3 mLs by nebulization every 4 (four) hours as needed. Patient taking differently: Take 3 mLs by nebulization every 4 (four) hours as needed (for shortness of breath or wheezing).  10/09/17  Yes Lauraine Rinne, NP  losartan (COZAAR) 25 MG tablet TAKE 1 TABLET BY MOUTH TWICE DAILY Patient taking differently: Take 25 mg by mouth 2 (two) times daily.  04/09/18  Yes Jettie Booze, MD  Multiple Vitamins-Minerals (CENTRUM SILVER 50+WOMEN) TABS Take 1 tablet by mouth daily with breakfast.    Yes [provider]   pantoprazole (PROTONIX) 40 MG tablet Take 40 mg by mouth daily. 08/29/17  Yes [provider]  spironolactone (ALDACTONE) 25 MG tablet TAKE 1 TABLET BY MOUTH ONCE DAILY Patient taking differently: Take 25 mg by mouth daily.  05/07/18  Yes Jettie Booze, MD  albuterol (PROVENTIL HFA;VENTOLIN HFA) 108 (90 Base) MCG/ACT inhaler Inhale 2 puffs into the lungs every 6 (six) hours as needed for wheezing. Patient not taking: Reported on 11/11/2018 01/18/18   Marshell Garfinkel, MD    Physical Exam: Vitals:   11/11/18 2000 11/11/18 2015 11/11/18 2115 11/11/18 2230  BP: 119/72 115/68 (!) 117/53 (!) 117/53  Pulse: (!) 56 (!) 58  61  Resp: 20 19 17 16   Temp:      TempSrc:      SpO2: 95% 94%  94%  Weight:      Height:        Physical Exam  Constitutional: She is oriented to person, place, and time. She appears well-developed and well-nourished. No distress.  HENT:  Head: Normocephalic.  Mouth/Throat: Oropharynx is clear and moist.  Eyes: Right eye exhibits no discharge. Left eye exhibits no discharge.  Neck: Neck supple.  Cardiovascular: Normal rate, regular rhythm and intact distal pulses.  Pulmonary/Chest: Effort normal and breath sounds normal. No respiratory distress. She has no wheezes. She has no rales.  Abdominal: Soft. Bowel sounds are normal. She exhibits no distension. There is no abdominal tenderness. There is no guarding.  Musculoskeletal:        General: No edema.     Comments: Calves symmetric in size.  No erythema, swelling, or tenderness.  Neurological: She is alert and oriented to person, place, and time.  Skin: Skin is warm and dry. She is not diaphoretic.     Labs on Admission: I have personally reviewed following labs and imaging studies  CBC: Recent Labs  Lab 11/11/18 1337  WBC 8.8  HGB 13.6  HCT 40.9  MCV 91.3  PLT 938   Basic Metabolic Panel: Recent Labs  Lab 11/11/18 1337  NA 137  K 3.7  CL 103  CO2 22  GLUCOSE 158*  BUN 18  CREATININE  0.83  CALCIUM 9.5   GFR: Estimated Creatinine Clearance: 65.2 mL/min (by C-G formula based on SCr of 0.83 mg/dL). Liver Function Tests: No results for input(s): AST, ALT, ALKPHOS, BILITOT,  PROT, ALBUMIN in the last 168 hours. No results for input(s): LIPASE, AMYLASE in the last 168 hours. No results for input(s): AMMONIA in the last 168 hours. Coagulation Profile: No results for input(s): INR, PROTIME in the last 168 hours. Cardiac Enzymes: No results for input(s): CKTOTAL, CKMB, CKMBINDEX, TROPONINI in the last 168 hours. BNP (last 3 results) No results for input(s): PROBNP in the last 8760 hours. HbA1C: No results for input(s): HGBA1C in the last 72 hours. CBG: No results for input(s): GLUCAP in the last 168 hours. Lipid Profile: No results for input(s): CHOL, HDL, LDLCALC, TRIG, CHOLHDL, LDLDIRECT in the last 72 hours. Thyroid Function Tests: No results for input(s): TSH, T4TOTAL, FREET4, T3FREE, THYROIDAB in the last 72 hours. Anemia Panel: No results for input(s): VITAMINB12, FOLATE, FERRITIN, TIBC, IRON, RETICCTPCT in the last 72 hours. Urine analysis:    Component Value Date/Time   COLORURINE YELLOW 05/04/2018 1558   APPEARANCEUR CLEAR 05/04/2018 1558   LABSPEC 1.008 05/04/2018 1558   PHURINE 6.0 05/04/2018 1558   GLUCOSEU NEGATIVE 05/04/2018 1558   HGBUR SMALL (A) 05/04/2018 1558   BILIRUBINUR NEGATIVE 05/04/2018 1558   KETONESUR NEGATIVE 05/04/2018 1558   PROTEINUR NEGATIVE 05/04/2018 1558   UROBILINOGEN 0.2 06/03/2014 1608   NITRITE NEGATIVE 05/04/2018 1558   LEUKOCYTESUR TRACE (A) 05/04/2018 1558    Radiological Exams on Admission: Dg Chest 2 View  Result Date: 11/11/2018 CLINICAL DATA:  Chest pain EXAM: CHEST - 2 VIEW COMPARISON:  May 04, 2018 FINDINGS: The heart size and mediastinal contours are stable. Dual lead cardiac pacemaker is identified. Both lungs are clear. The visualized skeletal structures are stable. Patient status post prior vertebroplasty of  thoracic vertebral bodies. IMPRESSION: No active cardiopulmonary disease. Electronically Signed   By: Abelardo Diesel M.D.   On: 11/11/2018 14:41    EKG: Independently reviewed.  Sinus rhythm, LBBB, QTC 517.  No significant change since prior tracing from January 2020.  Assessment/Plan Principal Problem:   Chest pain Active Problems:   Type B WPW syndrome- RFA Dec 2013   Essential hypertension   Chronic systolic heart failure (HCC)   Depression   Chest pain Concern for possible PE based on history and elevated d-dimer (1.25).  Hemodynamically stable. Not tachycardic or hypoxic.  High-sensitivity troponin checked twice negative.  EKG without acute ischemic changes.  Received aspirin 324 mg by EMS.  BNP normal. Chest x-ray showing no active cardiopulmonary disease.  Currently appears comfortable on exam and in no distress. -Cardiac monitoring -CT angiogram to rule out PE pending.  Plan to premedicate with Solu-Cortef and Benadryl for contrast allergy (hives). -Trend troponin -Continuous pulse ox; supplemental oxygen as needed  Addendum: Discussed with radiology.  CT angiogram will not be done until after 4 AM due to patient's contrast allergy and need for premedication.  Third set of high-sensitivity troponin negative as well.  ACS less likely.  Will start empiric IV heparin due to concern for possible PE.  Chronic combined systolic and diastolic congestive heart failure Status post ICD last echo in July 2016 with LVEF 20 to 25% and decrease LV diastolic compliance.  Currently appears euvolemic on exam. -Continue home Coreg, losartan, spironolactone -Hold Lasix at this time  WPW syndrome -Has an ICD  Asthma -Stable.  No bronchospasm.  Continue home inhalers.  GERD -Continue PPI  Hypertension -Continue home Coreg, losartan, spironolactone  Depression, anxiety -Continue home Celexa  DVT prophylaxis: Depending on CTA results. Code Status: Patient wishes to be full code. Family  Communication: No  family available at this time. Disposition Plan: Anticipate discharge after clinical improvement. Consults called: None Admission status: It is my clinical opinion that referral for OBSERVATION is reasonable and necessary in this patient based on the above information provided. The aforementioned taken together are felt to place the patient at high risk for further clinical deterioration. However it is anticipated that the patient may be medically stable for discharge from the hospital within 24 to 48 hours.  The medical decision making on this patient was of high complexity and the patient is at high risk for clinical deterioration, therefore this is a level 3 visit.  Shela Leff MD [facility] Pager [phone]  If 7PM-7AM, please contact night-coverage [email or web address] Password [email or web address]  11/11/2018, 10:40 PM

## 2018-11-11 NOTE — ED Notes (Signed)
ED TO INPATIENT HANDOFF REPORT  ED Nurse Name and Phone #:  [phone]  S Name/Age/Gender Paula Booker 73 y.o. female Room/Bed: 038C/038C  Code Status   Code Status: Full Code  Home/SNF/Other Home Patient oriented to: self, place, time and situation Is this baseline? Yes   Triage Complete: Triage complete  Chief Complaint CP  Triage Note To ED for eval of cp starting an hour pta. Brought to ED via [facility]. Given 324mg  asa and 2 nitro pta - with little relief.   On triage interview pt states she was playing on her ipad when pain started. States pain is midsternal and does not radiate. Skin w/d, resp e/u. Ate this am - eggs and grits.    Allergies Allergies  Allergen Reactions  . Ivp Dye [Iodinated Diagnostic Agents] Hives and Other (See Comments)  . Penicillins Anaphylaxis    Has patient had a PCN reaction causing immediate rash, facial/tongue/throat swelling, SOB or lightheadedness with hypotension: Yes Has patient had a PCN reaction causing severe rash involving mucus membranes or skin necrosis: No Has patient had a PCN reaction that required hospitalization No Has patient had a PCN reaction occurring within the last 10 years: No If all of the above answers are "NO", then may proceed with Cephalosporin use.  . Sulfonamide Derivatives Other (See Comments)    Migraines  . Tessalon [Benzonatate] Hives and Rash    Severe rash  . Sulfa Antibiotics Other (See Comments)    Migraines  . Rosuvastatin Calcium Other (See Comments)    Reaction not recalled by the patient  . Onion Other (See Comments)    Raw onions cause migraines    Level of Care/Admitting Diagnosis ED Disposition    ED Disposition Condition Comment   Admit  Hospital Area: [facility] [[id number]]  Level of Care: Telemetry Cardiac [103]  I expect the patient will be discharged within 24 hours: Yes  LOW acuity---Tx typically complete <24 hrs---ACUTE conditions typically can be evaluated <24  hours---LABS likely to return to acceptable levels <24 hours---IS near functional baseline---EXPECTED to return to current living arrangement---NOT newly hypoxic: Meets criteria for 5C-Observation unit  Covid Evaluation: Asymptomatic Screening Protocol (No Symptoms)  Diagnosis: Chest pain [027741]  Admitting Physician: Shela Leff [[id number]]  Attending Physician: Shela Leff [[id number]]  PT Class (Do Not Modify): Observation [104]  PT Acc Code (Do Not Modify): Observation [10022]       B Medical/Surgery History Past Medical History:  Diagnosis Date  . Anemia    as a child  . Anxiety   . Arthritis    "hands and knees" (03/22/2012)  . Asthma   . CHF (congestive heart failure) ([place]) 6/13  . Depression   . GERD (gastroesophageal reflux disease)   . Head injury, acute, with loss of consciousness ([place])   . Hypertension   . ICD (implantable cardiac defibrillator) in place Dec 2013  . Migraines    migraines   . NICM (nonischemic cardiomyopathy) ([place])   . Obesity (BMI 30-39.9)    negative sleep study 2014  . OSA (obstructive sleep apnea) 05/27/2018   Mild with AHI 6/hr  . Pacemaker   . Pneumonia    hx  . Shortness of breath    "@ any time I can get SOB" (03/22/2012)  . Skin cancer 1999  . WPW (Wolff-Parkinson-White syndrome)    Past Surgical History:  Procedure Laterality Date  . CARDIAC CATHETERIZATION  09/18/11   no significant CAD  . CARDIAC DEFIBRILLATOR PLACEMENT  03/22/2012  DDD  . CHOLECYSTECTOMY  ~ 2003  . IMPLANTABLE CARDIOVERTER DEFIBRILLATOR IMPLANT N/A 03/22/2012   Procedure: IMPLANTABLE CARDIOVERTER DEFIBRILLATOR IMPLANT;  Surgeon: Evans Lance, MD;  Location: [facility] CATH LAB;  Service: Cardiovascular;  Laterality: N/A;  . KNEE ARTHROSCOPY Left 11/29/2017   Procedure: ARTHROSCOPY LEFT KNEE;  Surgeon: Dorna Leitz, MD;  Location: [facility] ORS;  Service: Orthopedics;  Laterality: Left;  . KYPHOPLASTY N/A 11/27/2013   Procedure: KYPHOPLASTY;  Surgeon: Sinclair Ship, MD;  Location: [facility];  Service: Orthopedics;  Laterality: N/A;  T9, T11 kyphoplasty  . KYPHOPLASTY N/A 06/05/2014   Procedure: KYPHOPLASTY;  Surgeon: Sinclair Ship, MD;  Location: [facility];  Service: Orthopedics;  Laterality: N/A;  T7 kyphoplasty  . MOHS SURGERY  06/2011   "forehead" (03/22/2012)  . SKIN CANCER EXCISION  1999   "top of my head and my right back shoulder"  . SKIN CANCER EXCISION     back  . SKIN CANCER EXCISION     face  . SUPRAVENTRICULAR TACHYCARDIA ABLATION  03/22/2012   unable to induce VT/RN report 03/22/2012  . SUPRAVENTRICULAR TACHYCARDIA ABLATION N/A 03/22/2012   Procedure: SUPRAVENTRICULAR TACHYCARDIA ABLATION;  Surgeon: Evans Lance, MD;  Location: [facility] CATH LAB;  Service: Cardiovascular;  Laterality: N/A;  . TONSILLECTOMY AND ADENOIDECTOMY  1950  . TUBAL LIGATION  1978     A IV Location/Drains/Wounds Patient Lines/Drains/Airways Status   Active Line/Drains/Airways    Name:   Placement date:   Placement time:   Site:   Days:   Peripheral IV 11/11/18 Left Antecubital   11/11/18    1703    Antecubital   less than 1   Peripheral IV 11/11/18 Right;Upper Forearm   11/11/18    2134    Forearm   less than 1   Incision (Closed) 11/29/17 Knee Left   11/29/17    1331     347          Intake/Output Last 24 hours No intake or output data in the 24 hours ending 11/11/18 2228  Labs/Imaging Results for orders placed or performed during the hospital encounter of 11/11/18 (from the past 48 hour(s))  Basic metabolic panel     Status: Abnormal   Collection Time: 11/11/18  1:37 PM  Result Value Ref Range   Sodium 137 135 - 145 mmol/L   Potassium 3.7 3.5 - 5.1 mmol/L   Chloride 103 98 - 111 mmol/L   CO2 22 22 - 32 mmol/L   Glucose, Bld 158 (H) 70 - 99 mg/dL   BUN 18 8 - 23 mg/dL   Creatinine, Ser 0.83 0.44 - 1.00 mg/dL   Calcium 9.5 8.9 - 10.3 mg/dL   GFR calc non Af Amer >60 >60 mL/min   GFR calc Af Amer >60 >60 mL/min   Anion gap 12 5 - 15     Comment: Performed at [facility], [place] [address], [place], [place] [address]  CBC     Status: None   Collection Time: 11/11/18  1:37 PM  Result Value Ref Range   WBC 8.8 4.0 - 10.5 K/uL   RBC 4.48 3.87 - 5.11 MIL/uL   Hemoglobin 13.6 12.0 - 15.0 g/dL   HCT 40.9 36.0 - 46.0 %   MCV 91.3 80.0 - 100.0 fL   MCH 30.4 26.0 - 34.0 pg   MCHC 33.3 30.0 - 36.0 g/dL   RDW 12.6 11.5 - 15.5 %   Platelets 281 150 - 400 K/uL  nRBC 0.0 0.0 - 0.2 %    Comment: Performed at [facility], [place] [address] [place], [place] [address]  Troponin I (High Sensitivity)     Status: None   Collection Time: 11/11/18  1:37 PM  Result Value Ref Range   Troponin I (High Sensitivity) 6 <18 ng/L    Comment: (NOTE) Elevated high sensitivity troponin I (hsTnI) values and significant  changes across serial measurements may suggest ACS but many other  chronic and acute conditions are known to elevate hsTnI results.  Refer to the "Links" section for chest pain algorithms and additional  guidance. Performed at [facility], [place] [address] [place], [place] [address]   Troponin I (High Sensitivity)     Status: None   Collection Time: 11/11/18  4:59 PM  Result Value Ref Range   Troponin I (High Sensitivity) 5 <18 ng/L    Comment: (NOTE) Elevated high sensitivity troponin I (hsTnI) values and significant  changes across serial measurements may suggest ACS but many other  chronic and acute conditions are known to elevate hsTnI results.  Refer to the "Links" section for chest pain algorithms and additional  guidance. Performed at [facility], [place] [address] [place], [place] [address]   Brain natriuretic peptide     Status: None   Collection Time: 11/11/18  6:30 PM  Result Value Ref Range   B Natriuretic Peptide 55.7 0.0 - 100.0 pg/mL    Comment: Performed at [facility] [address] [place], [place] [address]  D-dimer, quantitative (not at [facility])     Status: Abnormal    Collection Time: 11/11/18  6:30 PM  Result Value Ref Range   D-Dimer, Quant 1.25 (H) 0.00 - 0.50 ug/mL-FEU    Comment: (NOTE) At the manufacturer cut-off of 0.50 ug/mL FEU, this assay has been documented to exclude PE with a sensitivity and negative predictive value of 97 to 99%.  At this time, this assay has not been approved by the FDA to exclude DVT/VTE. Results should be correlated with clinical presentation. Performed at [facility], [place] [address], [place], [place] [address]   Troponin I (High Sensitivity)     Status: None   Collection Time: 11/11/18  9:30 PM  Result Value Ref Range   Troponin I (High Sensitivity) 5 <18 ng/L    Comment: (NOTE) Elevated high sensitivity troponin I (hsTnI) values and significant  changes across serial measurements may suggest ACS but many other  chronic and acute conditions are known to elevate hsTnI results.  Refer to the "Links" section for chest pain algorithms and additional  guidance. Performed at [facility], [place] [address] [place], [place] [address]    Dg Chest 2 View  Result Date: 11/11/2018 CLINICAL DATA:  Chest pain EXAM: CHEST - 2 VIEW COMPARISON:  May 04, 2018 FINDINGS: The heart size and mediastinal contours are stable. Dual lead cardiac pacemaker is identified. Both lungs are clear. The visualized skeletal structures are stable. Patient status post prior vertebroplasty of thoracic vertebral bodies. IMPRESSION: No active cardiopulmonary disease. Electronically Signed   By: Abelardo Diesel M.D.   On: 11/11/2018 14:41    Pending Labs Unresulted Labs (From admission, onward)    Start     Ordered   11/11/18 2224  Magnesium  Add-on,   AD     11/11/18 2224   11/11/18 2208  SARS CORONAVIRUS 2 Nasal Swab Aptima Multi Swab  (Asymptomatic Patients Labs)  Once,   STAT    Question Answer Comment  Is this test for diagnosis or screening Screening   Symptomatic for COVID-19 as defined by CDC No   Hospitalized for COVID-19 No    Admitted to ICU for COVID-19 No   Previously tested for COVID-19 No   Resident in a congregate (group) care setting No   Employed in healthcare setting No   Pregnant No      11/11/18 2207          Vitals/Pain Today's Vitals   11/11/18 1945 11/11/18 2000 11/11/18 2015 11/11/18 2115  BP:  119/72 115/68 (!) 117/53  Pulse:  (!) 56 (!) 58   Resp:  20 19 17   Temp: 98.1 F (36.7 C)     TempSrc: Oral     SpO2:  95% 94%   Weight:      Height:      PainSc:        Isolation Precautions No active isolations  Medications Medications  sodium chloride flush (NS) 0.9 % injection 3 mL (3 mLs Intravenous Not Given 11/11/18 1704)  hydrocortisone sodium succinate (SOLU-CORTEF) injection 200 mg (has no administration in time range)  diphenhydrAMINE (BENADRYL) capsule 50 mg (has no administration in time range)    Or  diphenhydrAMINE (BENADRYL) injection 50 mg (has no administration in time range)  carvedilol (COREG) tablet 12.5 mg (has no administration in time range)  losartan (COZAAR) tablet 25 mg (has no administration in time range)  spironolactone (ALDACTONE) tablet 25 mg (has no administration in time range)  citalopram (CELEXA) tablet 20 mg (has no administration in time range)  pantoprazole (PROTONIX) EC tablet 40 mg (has no administration in time range)  Vitamin B-12 LOZG 5,000 mcg (has no administration in time range)  Vitamin D3 CAPS 5,000 Units (has no administration in time range)  Centrum Silver 50+Women TABS 1 tablet (has no administration in time range)  albuterol (VENTOLIN HFA) 108 (90 Base) MCG/ACT inhaler 2 puff (has no administration in time range)  loratadine (CLARITIN) tablet 10 mg (has no administration in time range)  fluticasone (FLONASE) 50 MCG/ACT nasal spray 2 spray (has no administration in time range)  fluticasone furoate-vilanterol (BREO ELLIPTA) 200-25 MCG/INH 1 puff (has no administration in time range)  ipratropium-albuterol (DUONEB) 0.5-2.5 (3) MG/3ML  nebulizer solution 3 mL (has no administration in time range)  acetaminophen (TYLENOL) tablet 650 mg (has no administration in time range)    Or  acetaminophen (TYLENOL) suppository 650 mg (has no administration in time range)  potassium chloride SA (K-DUR) CR tablet 40 mEq (has no administration in time range)    Mobility walks with person assist Low fall risk   Focused Assessments Cardiac Assessment Handoff:  Cardiac Rhythm: Normal sinus rhythm Lab Results  Component Value Date   TROPONINI <0.03 03/19/2015   Lab Results  Component Value Date   DDIMER 1.25 (H) 11/11/2018   Does the Patient currently have chest pain? Yes     R Recommendations: See Admitting Provider Note  Report given to:   Additional Notes: -

## 2018-11-11 NOTE — ED Notes (Signed)
Per Dr. Rogene Houston, pt can eat and drink. Meal given to pt

## 2018-11-12 ENCOUNTER — Encounter (HOSPITAL_COMMUNITY): Payer: Self-pay | Admitting: Radiology

## 2018-11-12 ENCOUNTER — Observation Stay (HOSPITAL_COMMUNITY): Payer: Medicare HMO

## 2018-11-12 DIAGNOSIS — R7989 Other specified abnormal findings of blood chemistry: Secondary | ICD-10-CM | POA: Diagnosis not present

## 2018-11-12 DIAGNOSIS — R079 Chest pain, unspecified: Secondary | ICD-10-CM | POA: Diagnosis not present

## 2018-11-12 DIAGNOSIS — R072 Precordial pain: Secondary | ICD-10-CM | POA: Diagnosis not present

## 2018-11-12 DIAGNOSIS — I10 Essential (primary) hypertension: Secondary | ICD-10-CM

## 2018-11-12 LAB — SARS CORONAVIRUS 2 (TAT 6-24 HRS): SARS Coronavirus 2: NEGATIVE

## 2018-11-12 LAB — HEPARIN LEVEL (UNFRACTIONATED): Heparin Unfractionated: 0.66 IU/mL (ref 0.30–0.70)

## 2018-11-12 MED ORDER — DOCUSATE SODIUM 100 MG PO CAPS: 100.0000 mg | ORAL_CAPSULE | Freq: Every day | ORAL | 0 refills | Status: DC

## 2018-11-12 MED ORDER — MAGNESIUM HYDROXIDE 400 MG/5ML PO SUSP: 30.0000 mL | Freq: Once | ORAL | Status: DC

## 2018-11-12 MED ORDER — IOHEXOL 350 MG/ML SOLN
75.0000 mL | Freq: Once | INTRAVENOUS | Status: AC | PRN
  Administered 2018-11-12: 75 mL via INTRAVENOUS

## 2018-11-12 MED ORDER — HEPARIN BOLUS VIA INFUSION
4000.0000 [IU] | Freq: Once | INTRAVENOUS | Status: AC
  Administered 2018-11-12: 01:00:00 4000 [IU] via INTRAVENOUS
  Filled 2018-11-12: qty 4000

## 2018-11-12 MED ORDER — HEPARIN (PORCINE) 25000 UT/250ML-% IV SOLN
1250.0000 [IU]/h | INTRAVENOUS | Status: DC
  Administered 2018-11-12: 1250 [IU]/h via INTRAVENOUS
  Filled 2018-11-12: qty 250

## 2018-11-12 MED ORDER — DOCUSATE SODIUM 100 MG PO CAPS
100.0000 mg | ORAL_CAPSULE | Freq: Every day | ORAL | Status: DC
  Administered 2018-11-12: 11:00:00 100 mg via ORAL
  Filled 2018-11-12: qty 1

## 2018-11-12 NOTE — Progress Notes (Signed)
Discharge information, diet and medication education given with teach back. Peripheral iv removed, clean dry and intact, pressure and dressing applied. Pt's belongings with pt: cell phone, clothes and shoes. Pt was transported in wheelchair by nurse tech. Questions and concerns answered.

## 2018-11-12 NOTE — Discharge Summary (Signed)
Physician Discharge Summary  [name] BWL:[id number] DOB: March 06, 1946 DOA: 11/11/2018  PCP: [name], MD  Admit date: 11/11/2018 Discharge date: 11/12/2018  Time spent: 45 minutes  Recommendations for Outpatient Follow-up:  1. Follow up with PCP 1-2 weeks for evaluation of symptoms and GERD.    Discharge Diagnoses:  Principal Problem:   Chest pain Active Problems:   GERD (gastroesophageal reflux disease)   Type B WPW syndrome- RFA Dec 2013   Essential hypertension   Chronic systolic heart failure (HCC)   Obesity (BMI 30-39.9)   Depression   Discharge Condition: stable  Diet recommendation: heart healhty   Filed Weights   11/11/18 1640 11/12/18 0439  Weight: 89.8 kg 90.8 kg    History of present illness:  Paula Booker is a 73 y.o. female with medical history significant of NICM, chronic systolic congestive heart failure, syncope, WPW status post ICD, anemia, anxiety, asthma, depression, GERD, hypertension, obesity, mild OSA presented to the hospital 8/2 for evaluation of chest pain.  Patient received aspirin 324 mg and 2 nitro by EMS.  Patient stated on her iPad around noontime  suddenly felt nauseous and started having substernal stabbing chest pain which radiated to her back.  She thought it was related to indigestion so she had some milk which did not help.  She also felt short of breath at that time and was sweating when EMTs arrived.  Denied any history of exertional chest pain/pressure.  Denied any history of blood clots.  Denied noticing any calf pain, redness, or swelling.  Stated contrast dye causes her to have hives.  Denied any history of anaphylaxis from contrast.  Stated in the past she has done well and not had any allergic reaction when given Benadryl prior to IV contrast.  Hospital Course:  Chest pain: heart score 5. Initially concern for possible PE based on history and elevated d-dimer (1.25). She was hemodynamically stable. Not tachycardic or hypoxic.   High-sensitivity troponin checked  Three times and were negative.  EKG without acute ischemic changes.  Received aspirin 324 mg by EMS.  BNP normal. Chest x-ray showing no active cardiopulmonary disease. no events on tele. CT angiogram rules out PE and reveals patchy air trapping likley related to history of asthma. Pain likely related to GI/gerd. Pain free at discharge.   Chronic combined systolic and diastolic congestive heart failure Status post ICD last echo in July 2016 with LVEF 20 to 25% and decrease LV diastolic compliance.  Remained euvolemic on exam. Continued home Coreg, losartan, spironolactone and held lasix. Will resume lasix at discharge  WPW syndrome -Has an ICD  Asthma -Stable.  No bronchospasm.  Continue home inhalers.  GERD -Continue PPI  Hypertension. contorlled -Continue home Coreg, losartan, spironolactone  Depression, anxiety -Continue home Celexa  Procedures:  Consultations:    Discharge Exam: Vitals:   11/12/18 0447 11/12/18 0848  BP: 110/77 115/71  Pulse: 65 68  Resp: 18 16  Temp: 98 F (36.7 C) 98 F (36.7 C)  SpO2: 96% 97%    General: awake alert no acute distress denies pain Cardiovascular: RRR no mgr no LE edema Respiratory: normal effort BS clear bilaterally no wheeze  Discharge Instructions   Discharge Instructions    Diet - low sodium heart healthy   Complete by: As directed    Discharge instructions   Complete by: As directed    Take medications as prescribed Follow up with PCP 1-2 weeks for evaluation of symptoms and GERD   Increase activity slowly  Complete by: As directed      Allergies as of 11/12/2018      Reactions   Ivp Dye [iodinated Diagnostic Agents] Hives, Other (See Comments)   Penicillins Anaphylaxis   Has patient had a PCN reaction causing immediate rash, facial/tongue/throat swelling, SOB or lightheadedness with hypotension: Yes Has patient had a PCN reaction causing severe rash involving mucus  membranes or skin necrosis: No Has patient had a PCN reaction that required hospitalization No Has patient had a PCN reaction occurring within the last 10 years: No If all of the above answers are "NO", then may proceed with Cephalosporin use.   Sulfonamide Derivatives Other (See Comments)   Migraines   Tessalon [benzonatate] Hives, Rash   Severe rash   Sulfa Antibiotics Other (See Comments)   Migraines   Rosuvastatin Calcium Other (See Comments)   Reaction not recalled by the patient   Onion Other (See Comments)   Raw onions cause migraines      Medication List    TAKE these medications   albuterol 108 (90 Base) MCG/ACT inhaler Commonly known as: VENTOLIN HFA Inhale 2 puffs into the lungs every 6 (six) hours as needed for wheezing. For wheezing. Use 2 puffs 3 times daily x5 days and then every 6 hours as needed. What changed:   additional instructions  Another medication with the same name was removed. Continue taking this medication, and follow the directions you see here.   alendronate 70 MG tablet Commonly known as: FOSAMAX Take 70 mg by mouth every Sunday.   carvedilol 12.5 MG tablet Commonly known as: COREG TAKE 1 TABLET BY MOUTH TWICE DAILY WITH A MEAL What changed: See the new instructions.   Centrum Silver 50+Women Tabs Take 1 tablet by mouth daily with breakfast.   cetirizine 10 MG tablet Commonly known as: ZYRTEC Take 10 mg by mouth daily.   citalopram 20 MG tablet Commonly known as: CELEXA Take 20 mg by mouth daily.   fluticasone 50 MCG/ACT nasal spray Commonly known as: FLONASE Place 2 sprays into both nostrils daily.   fluticasone furoate-vilanterol 200-25 MCG/INH Aepb Commonly known as: Breo Ellipta Inhale 1 puff into the lungs daily.   furosemide 40 MG tablet Commonly known as: LASIX TAKE 1 TO 2 TABLETS BY MOUTH DAILY AS DIRECTED FOR EDEMA, SHORTNESS OF BREATH AND /OR WEIGHT GAIN What changed: See the new instructions.    ipratropium-albuterol 0.5-2.5 (3) MG/3ML Soln Commonly known as: DUONEB Take 3 mLs by nebulization every 4 (four) hours as needed. What changed: reasons to take this   losartan 25 MG tablet Commonly known as: COZAAR TAKE 1 TABLET BY MOUTH TWICE DAILY   pantoprazole 40 MG tablet Commonly known as: PROTONIX Take 40 mg by mouth daily.   spironolactone 25 MG tablet Commonly known as: ALDACTONE TAKE 1 TABLET BY MOUTH ONCE DAILY   Vitamin B-12 5000 MCG Lozg Take 5,000 mcg by mouth daily.   Vitamin D3 125 MCG (5000 UT) Caps Take 5,000 Units by mouth daily.      Allergies  Allergen Reactions  . Ivp Dye [Iodinated Diagnostic Agents] Hives and Other (See Comments)  . Penicillins Anaphylaxis    Has patient had a PCN reaction causing immediate rash, facial/tongue/throat swelling, SOB or lightheadedness with hypotension: Yes Has patient had a PCN reaction causing severe rash involving mucus membranes or skin necrosis: No Has patient had a PCN reaction that required hospitalization No Has patient had a PCN reaction occurring within the last 10 years: No If  all of the above answers are "NO", then may proceed with Cephalosporin use.  . Sulfonamide Derivatives Other (See Comments)    Migraines  . Tessalon [Benzonatate] Hives and Rash    Severe rash  . Sulfa Antibiotics Other (See Comments)    Migraines  . Rosuvastatin Calcium Other (See Comments)    Reaction not recalled by the patient  . Onion Other (See Comments)    Raw onions cause migraines   Follow-up Information    [name], MD. Go on 11/19/2018.   Specialty: Family Medicine Why: @ 10:45 am Contact information: [place] [address] [phone]        Jettie Booze, MD On 11/26/2018.   Specialties: Cardiology, Radiology, Interventional Cardiology Why: @ 9:50  Contact information: [address] [place] [address] [phone]            The results of  significant diagnostics from this hospitalization (including imaging, microbiology, ancillary and laboratory) are listed below for reference.    Significant Diagnostic Studies: Dg Chest 2 View  Result Date: 11/11/2018 CLINICAL DATA:  Chest pain EXAM: CHEST - 2 VIEW COMPARISON:  May 04, 2018 FINDINGS: The heart size and mediastinal contours are stable. Dual lead cardiac pacemaker is identified. Both lungs are clear. The visualized skeletal structures are stable. Patient status post prior vertebroplasty of thoracic vertebral bodies. IMPRESSION: No active cardiopulmonary disease. Electronically Signed   By: Abelardo Diesel M.D.   On: 11/11/2018 14:41   Ct Angio Chest Pe W/cm &/or Wo Cm  Result Date: 11/12/2018 CLINICAL DATA:  Chest pain with positive D-dimer EXAM: CT ANGIOGRAPHY CHEST WITH CONTRAST TECHNIQUE: Multidetector CT imaging of the chest was performed using the standard protocol during bolus administration of intravenous contrast. Multiplanar CT image reconstructions and MIPs were obtained to evaluate the vascular anatomy. CONTRAST:  20mL OMNIPAQUE IOHEXOL 350 MG/ML SOLN COMPARISON:  Chest CTA 01/27/2014 FINDINGS: Cardiovascular: Negative for acute pulmonary embolism. Small areas of remote cement embolism are seen bilaterally; there is no chart indication of recent kyphoplasty. Normal heart size. Dual-chamber pacer leads from the left in unremarkable position. Limited opacification of the aorta which is negative. Mediastinum/Nodes: Negative for adenopathy or mass. Lungs/Pleura: Mosaic attenuation the lungs usually attributed to small airways disease in this patient with history of asthma. There is no edema, consolidation, effusion, or pneumothorax. Upper Abdomen: Cholecystectomy. Musculoskeletal: Remote T7, T9, and T11 kyphoplasty. No acute or aggressive finding. Review of the MIP images confirms the above findings. IMPRESSION: 1. Negative for acute pulmonary embolism. 2. Patchy air trapping likely  related to history of asthma. Electronically Signed   By: Monte Fantasia M.D.   On: 11/12/2018 05:13    Microbiology: Recent Results (from the past 240 hour(s))  SARS CORONAVIRUS 2 Nasal Swab Aptima Multi Swab     Status: None   Collection Time: 11/11/18 10:08 PM   Specimen: Aptima Multi Swab; Nasal Swab  Result Value Ref Range Status   SARS Coronavirus 2 NEGATIVE NEGATIVE Final    Comment: (NOTE) SARS-CoV-2 target nucleic acids are NOT DETECTED. The SARS-CoV-2 RNA is generally detectable in upper and lower respiratory specimens during the acute phase of infection. Negative results do not preclude SARS-CoV-2 infection, do not rule out co-infections with other pathogens, and should not be used as the sole basis for treatment or other patient management decisions. Negative results must be combined with clinical observations, patient history, and epidemiological information. The expected result is Negative. Fact Sheet  for Patients: SugarRoll.be Fact Sheet for Healthcare Providers: https://www.woods-mathews.com/ This test is not yet approved or cleared by the [place] FDA and  has been authorized for detection and/or diagnosis of SARS-CoV-2 by FDA under an Emergency Use Authorization (EUA). This EUA will remain  in effect (meaning this test can be used) for the duration of the COVID-19 declaration under Section 56 4(b)(1) of the Act, 21 U.S.C. section 360bbb-3(b)(1), unless the authorization is terminated or revoked sooner. Performed at [facility], [place] [address], [place], [place] [address]      Labs: Basic Metabolic Panel: Recent Labs  Lab 11/11/18 1337 11/11/18 2130  NA 137  --   K 3.7  --   CL 103  --   CO2 22  --   GLUCOSE 158*  --   BUN 18  --   CREATININE 0.83  --   CALCIUM 9.5  --   MG  --  1.9   Liver Function Tests: No results for input(s): AST, ALT, ALKPHOS, BILITOT, PROT, ALBUMIN in the last 168  hours. No results for input(s): LIPASE, AMYLASE in the last 168 hours. No results for input(s): AMMONIA in the last 168 hours. CBC: Recent Labs  Lab 11/11/18 1337  WBC 8.8  HGB 13.6  HCT 40.9  MCV 91.3  PLT 281   Cardiac Enzymes: No results for input(s): CKTOTAL, CKMB, CKMBINDEX, TROPONINI in the last 168 hours. BNP: BNP (last 3 results) Recent Labs    05/04/18 1138 11/11/18 1830  BNP 62.5 55.7    ProBNP (last 3 results) No results for input(s): PROBNP in the last 8760 hours.  CBG: No results for input(s): GLUCAP in the last 168 hours.     SignedRadene Gunning NP [facility] 11/12/2018, 10:24 AM

## 2018-11-12 NOTE — Evaluation (Signed)
Physical Therapy Evaluation and Discharge Patient Details Name: Paula Booker MRN: [id number] DOB: 01/02/1946 Today's Date: 11/12/2018   History of Present Illness  Pt is a 73 y/o female admitted secondary to chest pain; per notes likely related to GERD. Imaging for PE negative. PMH includes HTN, obesity, NICM, CHF, WPW syndrome s/p ICD, and s/p kyphoplasty.   Clinical Impression  Patient evaluated by Physical Therapy with no further acute PT needs identified. All education has been completed and the patient has no further questions. Pt overall at a mod I to independent level with all mobility. Did experience some SOB, however, pt reports that is baseline. Oxygen sats at 95%. Pt denied chest pain throughout. See below for any follow-up Physical Therapy or equipment needs. PT is signing off. Thank you for this referral. If needs change, please re-consult.       Follow Up Recommendations No PT follow up    Equipment Recommendations  None recommended by PT    Recommendations for Other Services       Precautions / Restrictions Precautions Precautions: None Restrictions Weight Bearing Restrictions: No      Mobility  Bed Mobility               General bed mobility comments: Sitting EOB upon entry  Transfers Overall transfer level: Independent                  Ambulation/Gait Ambulation/Gait assistance: Independent Gait Distance (Feet): 300 Feet Assistive device: None Gait Pattern/deviations: WFL(Within Functional Limits) Gait velocity: WFL  Gait velocity interpretation: >4.37 ft/sec, indicative of normal walking speed General Gait Details: Good gait speed, and no LOB noted. Pt did experience some SOB and required seated rest prior to stair navigation. Oxygen sats at 95% on RA. Pt denied chest pain during exertion.   Stairs Stairs: Yes Stairs assistance: Modified independent (Device/Increase time) Stair Management: One rail Right Number of Stairs: 6 General  stair comments: Steady stair navigation. No physical assist required. Pt denies chest pain during stair navigation.   Wheelchair Mobility    Modified Rankin (Stroke Patients Only)       Balance Overall balance assessment: No apparent balance deficits (not formally assessed)                                           Pertinent Vitals/Pain Pain Assessment: No/denies pain    Home Living Family/patient expects to be discharged to:: Private residence Living Arrangements: Alone Available Help at Discharge: Family;Available PRN/intermittently Type of Home: House Home Access: Stairs to enter Entrance Stairs-Rails: Right Entrance Stairs-Number of Steps: 6 Home Layout: One level Home Equipment: None      Prior Function Level of Independence: Independent               Hand Dominance        Extremity/Trunk Assessment   Upper Extremity Assessment Upper Extremity Assessment: Overall WFL for tasks assessed    Lower Extremity Assessment Lower Extremity Assessment: Overall WFL for tasks assessed    Cervical / Trunk Assessment Cervical / Trunk Assessment: Normal  Communication   Communication: No difficulties  Cognition Arousal/Alertness: Awake/alert Behavior During Therapy: WFL for tasks assessed/performed Overall Cognitive Status: Within Functional Limits for tasks assessed  General Comments      Exercises     Assessment/Plan    PT Assessment Patent does not need any further PT services  PT Problem List         PT Treatment Interventions      PT Goals (Current goals can be found in the Care Plan section)  Acute Rehab PT Goals Patient Stated Goal: to go home PT Goal Formulation: With patient Time For Goal Achievement: 11/12/18 Potential to Achieve Goals: Good    Frequency     Barriers to discharge        Co-evaluation               AM-PAC PT "6 Clicks" Mobility   Outcome Measure Help needed turning from your back to your side while in a flat bed without using bedrails?: None Help needed moving from lying on your back to sitting on the side of a flat bed without using bedrails?: None Help needed moving to and from a bed to a chair (including a wheelchair)?: None Help needed standing up from a chair using your arms (e.g., wheelchair or bedside chair)?: None Help needed to walk in hospital room?: None Help needed climbing 3-5 steps with a railing? : None 6 Click Score: 24    End of Session   Activity Tolerance: Patient tolerated treatment well Patient left: in bed;with call bell/phone within reach Nurse Communication: Mobility status PT Visit Diagnosis: Other abnormalities of gait and mobility (R26.89)    Time: 1038-1050 PT Time Calculation (min) (ACUTE ONLY): 12 min   Charges:   PT Evaluation $PT Eval Low Complexity: [place], PT, DPT  Acute Rehabilitation Services  Pager: [phone] Office: [phone]   Rudean Hitt 11/12/2018, 11:27 AM

## 2018-11-12 NOTE — Progress Notes (Signed)
ANTICOAGULATION CONSULT NOTE - Initial Consult  Pharmacy Consult for Heparin Indication: R/O PE  Allergies  Allergen Reactions  . Ivp Dye [Iodinated Diagnostic Agents] Hives and Other (See Comments)  . Penicillins Anaphylaxis    Has patient had a PCN reaction causing immediate rash, facial/tongue/throat swelling, SOB or lightheadedness with hypotension: Yes Has patient had a PCN reaction causing severe rash involving mucus membranes or skin necrosis: No Has patient had a PCN reaction that required hospitalization No Has patient had a PCN reaction occurring within the last 10 years: No If all of the above answers are "NO", then may proceed with Cephalosporin use.  . Sulfonamide Derivatives Other (See Comments)    Migraines  . Tessalon [Benzonatate] Hives and Rash    Severe rash  . Sulfa Antibiotics Other (See Comments)    Migraines  . Rosuvastatin Calcium Other (See Comments)    Reaction not recalled by the patient  . Onion Other (See Comments)    Raw onions cause migraines    Patient Measurements: Height: 5\' 3"  (160 cm) Weight: 198 lb (89.8 kg) IBW/kg (Calculated) : 52.4 Heparin Dosing Weight: 75 kg  Vital Signs: Temp: 98.8 F (37.1 C) (08/02 2313) Temp Source: Oral (08/02 2313) BP: 125/52 (08/02 2313) Pulse Rate: 60 (08/02 2313)  Labs: Recent Labs    11/11/18 1337 11/11/18 1659 11/11/18 2130 11/11/18 2302  HGB 13.6  --   --   --   HCT 40.9  --   --   --   PLT 281  --   --   --   CREATININE 0.83  --   --   --   TROPONINIHS 6 5 5 6     Estimated Creatinine Clearance: 65.2 mL/min (by C-G formula based on SCr of 0.83 mg/dL).   Medical History: Past Medical History:  Diagnosis Date  . Anemia    as a child  . Anxiety   . Arthritis    "hands and knees" (03/22/2012)  . Asthma   . CHF (congestive heart failure) ([place]) 6/13  . Depression   . GERD (gastroesophageal reflux disease)   . Head injury, acute, with loss of consciousness ([place])   . Hypertension   .  ICD (implantable cardiac defibrillator) in place Dec 2013  . Migraines    migraines   . NICM (nonischemic cardiomyopathy) ([place])   . Obesity (BMI 30-39.9)    negative sleep study 2014  . OSA (obstructive sleep apnea) 05/27/2018   Mild with AHI 6/hr  . Pacemaker   . Pneumonia    hx  . Shortness of breath    "@ any time I can get SOB" (03/22/2012)  . Skin cancer 1999  . WPW (Wolff-Parkinson-White syndrome)     Medications:  Medications Prior to Admission  Medication Sig Dispense Refill Last Dose  . albuterol (PROVENTIL HFA;VENTOLIN HFA) 108 (90 Base) MCG/ACT inhaler Inhale 2 puffs into the lungs every 6 (six) hours as needed for wheezing. For wheezing. Use 2 puffs 3 times daily x5 days and then every 6 hours as needed. (Patient taking differently: Inhale 2 puffs into the lungs every 6 (six) hours as needed for wheezing. ) 1 Inhaler 5 Past Week at Unknown time  . alendronate (FOSAMAX) 70 MG tablet Take 70 mg by mouth every Sunday.    11/04/2018 at Unknown time  . carvedilol (COREG) 12.5 MG tablet TAKE 1 TABLET BY MOUTH TWICE DAILY WITH A MEAL (Patient taking differently: Take 12.5 mg by mouth 2 (two) times daily with  a meal. ) 180 tablet 3 11/11/2018 at 0800  . cetirizine (ZYRTEC) 10 MG tablet Take 10 mg by mouth daily.   11/11/2018 at am  . Cholecalciferol (VITAMIN D3) 5000 units CAPS Take 5,000 Units by mouth daily.   11/11/2018 at am  . citalopram (CELEXA) 20 MG tablet Take 20 mg by mouth daily.    11/11/2018 at am  . Cyanocobalamin (VITAMIN B-12) 5000 MCG LOZG Take 5,000 mcg by mouth daily.   11/11/2018 at Unknown time  . fluticasone (FLONASE) 50 MCG/ACT nasal spray Place 2 sprays into both nostrils daily.   11/10/2018 at Unknown time  . fluticasone furoate-vilanterol (BREO ELLIPTA) 200-25 MCG/INH AEPB Inhale 1 puff into the lungs daily. 60 each 6 11/11/2018 at Unknown time  . furosemide (LASIX) 40 MG tablet TAKE 1 TO 2 TABLETS BY MOUTH DAILY AS DIRECTED FOR EDEMA, SHORTNESS OF BREATH AND /OR WEIGHT  GAIN (Patient taking differently: Take 80-120 mg by mouth See admin instructions. Take 80 mg by mouth in the morning and an additional 40 mg once daily as needed for fluid retention) 180 tablet 2 11/11/2018 at am  . ipratropium-albuterol (DUONEB) 0.5-2.5 (3) MG/3ML SOLN Take 3 mLs by nebulization every 4 (four) hours as needed. (Patient taking differently: Take 3 mLs by nebulization every 4 (four) hours as needed (for shortness of breath or wheezing). ) 360 mL 1 10/28/2018 at Unknown time  . losartan (COZAAR) 25 MG tablet TAKE 1 TABLET BY MOUTH TWICE DAILY (Patient taking differently: Take 25 mg by mouth 2 (two) times daily. ) 180 tablet 3 11/11/2018 at am  . Multiple Vitamins-Minerals (CENTRUM SILVER 50+WOMEN) TABS Take 1 tablet by mouth daily with breakfast.    11/11/2018 at am  . pantoprazole (PROTONIX) 40 MG tablet Take 40 mg by mouth daily.  0 11/11/2018 at am  . spironolactone (ALDACTONE) 25 MG tablet TAKE 1 TABLET BY MOUTH ONCE DAILY (Patient taking differently: Take 25 mg by mouth daily. ) 90 tablet 3 11/11/2018 at am  . albuterol (PROVENTIL HFA;VENTOLIN HFA) 108 (90 Base) MCG/ACT inhaler Inhale 2 puffs into the lungs every 6 (six) hours as needed for wheezing. (Patient not taking: Reported on 11/11/2018) 1 Inhaler 5 Not Taking at Unknown time    Assessment: 73 y.o. female with chest pain, eleavted D-Dimer and possible PE, for heparin  Goal of Therapy:  Heparin level 0.3-0.7 units/ml Monitor platelets by anticoagulation protocol: Yes   Plan:  Heparin 4000 units IV bolus, then start heparin 1250 units/hr Check heparin level in 8 hours. F/U Chest CT results  Caryl Pina 11/12/2018,12:41 AM

## 2018-11-19 DIAGNOSIS — K449 Diaphragmatic hernia without obstruction or gangrene: Secondary | ICD-10-CM | POA: Diagnosis not present

## 2018-11-26 ENCOUNTER — Ambulatory Visit (INDEPENDENT_AMBULATORY_CARE_PROVIDER_SITE_OTHER): Payer: Medicare HMO

## 2018-11-26 DIAGNOSIS — I5022 Chronic systolic (congestive) heart failure: Secondary | ICD-10-CM

## 2018-11-26 DIAGNOSIS — Z95810 Presence of automatic (implantable) cardiac defibrillator: Secondary | ICD-10-CM

## 2018-11-27 ENCOUNTER — Telehealth: Payer: Self-pay

## 2018-11-27 NOTE — Progress Notes (Signed)
Patient returned call and she is doing well.  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  She is elevating legs above her heart when they are swollen.  No changes and encouraged to call if experiencing any fluid symptoms.  Next remote transmission scheduled 01/21/2019.

## 2018-11-27 NOTE — Telephone Encounter (Signed)
Remote ICM transmission received.  Attempted call to patient regarding ICM remote transmission and left detailed message, per DPR, with next ICM remote transmission date of 01/21/2019.  Advised to return call for any fluid symptoms or questions.    

## 2018-11-27 NOTE — Progress Notes (Signed)
EPIC Encounter for ICM Monitoring  Patient Name: Paula Booker is a 73 y.o. female Date: 11/27/2018 Primary Care Physican: [name], MD Primary [place] Electrophysiologist: Lovena Le 10/24/2018 Weight:196 lbs       Attempted call to patient and unable to reach.  Left detailed message per DPR regarding transmission. Transmission reviewed.   Optivol thoracic impedance normal.  Optivol thoracic impedance normal.  Prescribed:Furosemide40 mg 2 tablets(80 mg total)daily.  Labs: 05/04/2018 Creatinine 0.95, BUN 18, Potassium 3.7, Sodium 134, GFR 60  Recommendations: Left voice mail with ICM number and encouraged to call if experiencing any fluid symptoms.  Follow-up plan: ICM clinic phone appointment on10/03/2019.   91 day device clinic check 01/08/2019.   Copy of ICM check sent to Dr.Taylor.   3 month ICM trend: 11/26/2018    1 Year ICM trend:       [name], RN 11/27/2018 1:20 PM

## 2018-12-24 DIAGNOSIS — Z8719 Personal history of other diseases of the digestive system: Secondary | ICD-10-CM | POA: Diagnosis not present

## 2018-12-24 DIAGNOSIS — Z1211 Encounter for screening for malignant neoplasm of colon: Secondary | ICD-10-CM | POA: Diagnosis not present

## 2018-12-24 DIAGNOSIS — K219 Gastro-esophageal reflux disease without esophagitis: Secondary | ICD-10-CM | POA: Diagnosis not present

## 2018-12-24 DIAGNOSIS — K9089 Other intestinal malabsorption: Secondary | ICD-10-CM | POA: Diagnosis not present

## 2018-12-25 DIAGNOSIS — K219 Gastro-esophageal reflux disease without esophagitis: Secondary | ICD-10-CM | POA: Diagnosis not present

## 2018-12-25 DIAGNOSIS — E669 Obesity, unspecified: Secondary | ICD-10-CM | POA: Diagnosis not present

## 2018-12-25 DIAGNOSIS — I428 Other cardiomyopathies: Secondary | ICD-10-CM | POA: Diagnosis not present

## 2018-12-25 DIAGNOSIS — J4520 Mild intermittent asthma, uncomplicated: Secondary | ICD-10-CM | POA: Diagnosis not present

## 2018-12-25 DIAGNOSIS — I10 Essential (primary) hypertension: Secondary | ICD-10-CM | POA: Diagnosis not present

## 2018-12-25 DIAGNOSIS — I509 Heart failure, unspecified: Secondary | ICD-10-CM | POA: Diagnosis not present

## 2018-12-25 DIAGNOSIS — I456 Pre-excitation syndrome: Secondary | ICD-10-CM | POA: Diagnosis not present

## 2018-12-27 ENCOUNTER — Other Ambulatory Visit: Payer: Self-pay | Admitting: Gastroenterology

## 2018-12-27 DIAGNOSIS — Z8719 Personal history of other diseases of the digestive system: Secondary | ICD-10-CM

## 2018-12-27 DIAGNOSIS — K219 Gastro-esophageal reflux disease without esophagitis: Secondary | ICD-10-CM

## 2018-12-31 ENCOUNTER — Ambulatory Visit
Admission: RE | Admit: 2018-12-31 | Discharge: 2018-12-31 | Disposition: A | Discharge status: Home / Self Care | Payer: Medicare HMO | Source: Ambulatory Visit | Attending: Gastroenterology | Admitting: Gastroenterology

## 2018-12-31 DIAGNOSIS — K449 Diaphragmatic hernia without obstruction or gangrene: Secondary | ICD-10-CM | POA: Diagnosis not present

## 2018-12-31 DIAGNOSIS — K224 Dyskinesia of esophagus: Secondary | ICD-10-CM | POA: Diagnosis not present

## 2018-12-31 DIAGNOSIS — K219 Gastro-esophageal reflux disease without esophagitis: Secondary | ICD-10-CM | POA: Diagnosis not present

## 2018-12-31 DIAGNOSIS — Z8719 Personal history of other diseases of the digestive system: Secondary | ICD-10-CM

## 2019-01-08 ENCOUNTER — Encounter: Payer: Medicare HMO | Admitting: *Deleted

## 2019-01-08 LAB — CUP PACEART REMOTE DEVICE CHECK
Battery Remaining Longevity: 37 mo
Battery Voltage: 2.92 V
Brady Statistic AP VP Percent: 0.02 %
Brady Statistic AP VS Percent: 12.84 %
Brady Statistic AS VP Percent: 0.03 %
Brady Statistic AS VS Percent: 87.11 %
Brady Statistic RA Percent Paced: 12.53 %
Brady Statistic RV Percent Paced: 0.05 %
Date Time Interrogation Session: 20200929062827
HighPow Impedance: 88 Ohm
Implantable Lead Implant Date: 20131212
Implantable Lead Implant Date: 20131212
Implantable Lead Location: 753859
Implantable Lead Location: 753860
Implantable Lead Model: 5076
Implantable Lead Model: 6935
Implantable Pulse Generator Implant Date: 20131212
Lead Channel Impedance Value: 513 Ohm
Lead Channel Impedance Value: 665 Ohm
Lead Channel Impedance Value: 817 Ohm
Lead Channel Pacing Threshold Amplitude: 0.375 V
Lead Channel Pacing Threshold Amplitude: 0.625 V
Lead Channel Pacing Threshold Pulse Width: 0.4 ms
Lead Channel Pacing Threshold Pulse Width: 0.4 ms
Lead Channel Sensing Intrinsic Amplitude: 3.5 mV
Lead Channel Sensing Intrinsic Amplitude: 3.5 mV
Lead Channel Sensing Intrinsic Amplitude: 8.75 mV
Lead Channel Sensing Intrinsic Amplitude: 8.75 mV
Lead Channel Setting Pacing Amplitude: 2 V
Lead Channel Setting Pacing Amplitude: 2.5 V
Lead Channel Setting Pacing Pulse Width: 0.4 ms
Lead Channel Setting Sensing Sensitivity: 0.3 mV

## 2019-01-11 DIAGNOSIS — R6889 Other general symptoms and signs: Secondary | ICD-10-CM | POA: Diagnosis not present

## 2019-01-17 DIAGNOSIS — M170 Bilateral primary osteoarthritis of knee: Secondary | ICD-10-CM | POA: Diagnosis not present

## 2019-01-21 ENCOUNTER — Ambulatory Visit (INDEPENDENT_AMBULATORY_CARE_PROVIDER_SITE_OTHER): Payer: Medicare HMO

## 2019-01-21 ENCOUNTER — Telehealth: Payer: Self-pay

## 2019-01-21 ENCOUNTER — Other Ambulatory Visit: Payer: Self-pay

## 2019-01-21 DIAGNOSIS — Z95810 Presence of automatic (implantable) cardiac defibrillator: Secondary | ICD-10-CM

## 2019-01-21 DIAGNOSIS — I5022 Chronic systolic (congestive) heart failure: Secondary | ICD-10-CM | POA: Diagnosis not present

## 2019-01-21 NOTE — Progress Notes (Signed)
EPIC Encounter for ICM Monitoring  Patient Name: Paula Booker is a 73 y.o. female Date: 01/21/2019 Primary Care Physican: [name], MD Primary [place] Electrophysiologist: Lovena Le 10/24/2018 Weight:196 lbs       Attempted call to patient and unable to reach.  Left detailed message per DPR regarding transmission. Transmission reviewed.   Optivol thoracic impedance suggestive of possible ongoing fluid accumulation since 01/14/2019.  Prescribed:Furosemide40 mg take 2 tablets(80 mg total)by mouth in the morning and an additional 40 mg once daily as needed for fluid retention  Labs: 11/11/2018 Creatinine 0.83, BUN 18, Potassium 3.7, Sodium 137, GFR >60 05/04/2018 Creatinine 0.95, BUN 18, Potassium 3.7, Sodium 134, GFR 60  Recommendations: Left voice mail with ICM number and encouraged to call if experiencing any fluid symptoms.  Follow-up plan: ICM clinic phone appointment on 01/29/2019 to recheck fluid levels.   91 day device clinic remote transmission 04/09/2019.   Copy of ICM check sent to Dr. Lovena Le and Dr Irish Lack.   3 month ICM trend: 01/21/2019    1 Year ICM trend:        [name], RN 01/21/2019 2:53 PM

## 2019-01-21 NOTE — Telephone Encounter (Signed)
Remote ICM transmission received.  Attempted call to patient regarding ICM remote transmission and left detailed message per DPR to return call.   

## 2019-01-29 ENCOUNTER — Ambulatory Visit (INDEPENDENT_AMBULATORY_CARE_PROVIDER_SITE_OTHER): Payer: Medicare HMO

## 2019-01-29 DIAGNOSIS — Z95810 Presence of automatic (implantable) cardiac defibrillator: Secondary | ICD-10-CM

## 2019-01-29 DIAGNOSIS — I5022 Chronic systolic (congestive) heart failure: Secondary | ICD-10-CM

## 2019-02-01 NOTE — Progress Notes (Signed)
EPIC Encounter for ICM Monitoring  Patient Name: Paula Booker is a 73 y.o. female Date: 02/01/2019 Primary Care Physican: [name], MD Primary [place] Electrophysiologist: Lovena Le Last Weight:196 lbs   Transmission reviewed.   Optivol thoracic impedance returned to normal baseline.  Prescribed:Furosemide40 mg take 2 tablets(80 mg total)by mouth in the morning and an additional 40 mg once daily as needed for fluid retention  Labs: 11/11/2018 Creatinine 0.83, BUN 18, Potassium 3.7, Sodium 137, GFR >60 05/04/2018 Creatinine 0.95, BUN 18, Potassium 3.7, Sodium 134, GFR 60  Recommendations: None  Follow-up plan: ICM clinic phone appointment on 02/25/2019 to recheck fluid levels.   91 day device clinic remote transmission 04/09/2019.  3 month ICM trend: 01/29/2019    1 Year ICM trend:       [name], RN 02/01/2019 1:41 PM

## 2019-02-04 DIAGNOSIS — M47816 Spondylosis without myelopathy or radiculopathy, lumbar region: Secondary | ICD-10-CM | POA: Diagnosis not present

## 2019-02-14 DIAGNOSIS — M47816 Spondylosis without myelopathy or radiculopathy, lumbar region: Secondary | ICD-10-CM | POA: Diagnosis not present

## 2019-02-21 ENCOUNTER — Other Ambulatory Visit: Payer: Self-pay | Admitting: Interventional Cardiology

## 2019-02-23 ENCOUNTER — Other Ambulatory Visit: Payer: Self-pay | Admitting: Pulmonary Disease

## 2019-02-23 DIAGNOSIS — R0602 Shortness of breath: Secondary | ICD-10-CM

## 2019-02-25 ENCOUNTER — Ambulatory Visit (INDEPENDENT_AMBULATORY_CARE_PROVIDER_SITE_OTHER): Payer: Medicare HMO

## 2019-02-25 DIAGNOSIS — I5022 Chronic systolic (congestive) heart failure: Secondary | ICD-10-CM | POA: Diagnosis not present

## 2019-02-25 DIAGNOSIS — Z95810 Presence of automatic (implantable) cardiac defibrillator: Secondary | ICD-10-CM | POA: Diagnosis not present

## 2019-02-26 ENCOUNTER — Telehealth: Payer: Self-pay

## 2019-02-26 NOTE — Telephone Encounter (Signed)
Remote ICM transmission received.  Attempted call to patient regarding ICM remote transmission and left detailed message per DPR.  Advised to return call for any fluid symptoms or questions.  

## 2019-02-26 NOTE — Progress Notes (Signed)
EPIC Encounter for ICM Monitoring  Patient Name: Paula Booker is a 73 y.o. female Date: 02/26/2019 Primary Care Physican: [name], MD Primary [place] Electrophysiologist: Lovena Le Last Weight:196 lbs   Attempted call to patient and unable to reach.  Left detailed message per DPR regarding transmission. Transmission reviewed.   Optivol thoracic impedancenormal  Prescribed:Furosemide40 mgtake2 tablets(80 mg total)by mouth in the morning and an additional 40 mg once daily as needed for fluid retention  Labs: 11/11/2018 Creatinine 0.83, BUN 18, Potassium 3.7, Sodium 137, GFR>60 05/04/2018 Creatinine 0.95, BUN 18, Potassium 3.7, Sodium 134, GFR 60  Recommendations: Left voice mail with ICM number and encouraged to call if experiencing any fluid symptoms.  Follow-up plan: ICM clinic phone appointment on 04/01/2019.   91 day device clinic remote transmission 04/09/2019.   Copy of ICM check sent to Dr. Lovena Le.   3 month ICM trend: 02/26/2019    1 Year ICM trend:      [name], RN 02/26/2019 3:30 PM

## 2019-04-01 ENCOUNTER — Ambulatory Visit (INDEPENDENT_AMBULATORY_CARE_PROVIDER_SITE_OTHER): Payer: Medicare HMO

## 2019-04-01 ENCOUNTER — Telehealth: Payer: Self-pay

## 2019-04-01 DIAGNOSIS — I5022 Chronic systolic (congestive) heart failure: Secondary | ICD-10-CM

## 2019-04-01 DIAGNOSIS — Z95810 Presence of automatic (implantable) cardiac defibrillator: Secondary | ICD-10-CM | POA: Diagnosis not present

## 2019-04-01 NOTE — Progress Notes (Signed)
EPIC Encounter for ICM Monitoring  Patient Name: Paula Booker is a 73 y.o. female Date: 04/01/2019 Primary Care Physican: [name], MD Primary [place] Electrophysiologist: Lovena Le LastWeight:196 lbs   Attempted call to patient and unable to reach.  Left detailed message per DPR regarding transmission. Transmission reviewed.   Optivol thoracic impedancenormal  Prescribed:Furosemide40 mgtake2 tablets(80 mg total)by mouth in the morning and an additional 40 mg once daily as needed for fluid retention  Labs: 11/11/2018 Creatinine 0.83, BUN 18, Potassium 3.7, Sodium 137, GFR>60 05/04/2018 Creatinine 0.95, BUN 18, Potassium 3.7, Sodium 134, GFR 60  Recommendations: Left voice mail with ICM number and encouraged to call if experiencing any fluid symptoms.  Follow-up plan: ICM clinic phone appointment on 05/06/2019.   91 day device clinic remote transmission 04/09/2019.   Copy of ICM check sent to Dr. Lovena Le.   3 month ICM trend: 04/01/2019    1 Year ICM trend:       [name], RN 04/01/2019 1:09 PM

## 2019-04-01 NOTE — Telephone Encounter (Signed)
Remote ICM transmission received.  Attempted call to patient regarding ICM remote transmission and left detailed message per DPR.  Advised to return call for any fluid symptoms or questions. Next ICM remote transmission scheduled 05/06/2019.

## 2019-04-08 ENCOUNTER — Other Ambulatory Visit: Payer: Self-pay

## 2019-04-08 ENCOUNTER — Emergency Department (HOSPITAL_COMMUNITY)
Admission: EM | Admit: 2019-04-08 | Discharge: 2019-04-09 | Disposition: A | Discharge status: Home / Self Care | Payer: Medicare HMO | Attending: Emergency Medicine | Admitting: Emergency Medicine

## 2019-04-08 ENCOUNTER — Emergency Department (HOSPITAL_COMMUNITY): Payer: Medicare HMO

## 2019-04-08 ENCOUNTER — Encounter (HOSPITAL_COMMUNITY): Payer: Self-pay

## 2019-04-08 DIAGNOSIS — I5022 Chronic systolic (congestive) heart failure: Secondary | ICD-10-CM | POA: Diagnosis not present

## 2019-04-08 DIAGNOSIS — Y9389 Activity, other specified: Secondary | ICD-10-CM | POA: Insufficient documentation

## 2019-04-08 DIAGNOSIS — Z79899 Other long term (current) drug therapy: Secondary | ICD-10-CM | POA: Insufficient documentation

## 2019-04-08 DIAGNOSIS — I110 Hypertensive heart disease with heart failure: Secondary | ICD-10-CM | POA: Insufficient documentation

## 2019-04-08 DIAGNOSIS — Z95811 Presence of heart assist device: Secondary | ICD-10-CM | POA: Insufficient documentation

## 2019-04-08 DIAGNOSIS — Y92013 Bedroom of single-family (private) house as the place of occurrence of the external cause: Secondary | ICD-10-CM | POA: Diagnosis not present

## 2019-04-08 DIAGNOSIS — W19XXXA Unspecified fall, initial encounter: Secondary | ICD-10-CM

## 2019-04-08 DIAGNOSIS — Z85828 Personal history of other malignant neoplasm of skin: Secondary | ICD-10-CM | POA: Insufficient documentation

## 2019-04-08 DIAGNOSIS — W01190A Fall on same level from slipping, tripping and stumbling with subsequent striking against furniture, initial encounter: Secondary | ICD-10-CM | POA: Insufficient documentation

## 2019-04-08 DIAGNOSIS — I951 Orthostatic hypotension: Secondary | ICD-10-CM | POA: Insufficient documentation

## 2019-04-08 DIAGNOSIS — Y999 Unspecified external cause status: Secondary | ICD-10-CM | POA: Diagnosis not present

## 2019-04-08 DIAGNOSIS — S060X1A Concussion with loss of consciousness of 30 minutes or less, initial encounter: Secondary | ICD-10-CM

## 2019-04-08 DIAGNOSIS — S069X9A Unspecified intracranial injury with loss of consciousness of unspecified duration, initial encounter: Secondary | ICD-10-CM

## 2019-04-08 DIAGNOSIS — R519 Headache, unspecified: Secondary | ICD-10-CM | POA: Diagnosis not present

## 2019-04-08 DIAGNOSIS — R42 Dizziness and giddiness: Secondary | ICD-10-CM | POA: Diagnosis present

## 2019-04-08 DIAGNOSIS — S0990XA Unspecified injury of head, initial encounter: Secondary | ICD-10-CM | POA: Diagnosis not present

## 2019-04-08 DIAGNOSIS — I456 Pre-excitation syndrome: Secondary | ICD-10-CM | POA: Insufficient documentation

## 2019-04-08 LAB — BASIC METABOLIC PANEL
Anion gap: 9 (ref 5–15)
BUN: 19 mg/dL (ref 8–23)
CO2: 28 mmol/L (ref 22–32)
Calcium: 9.7 mg/dL (ref 8.9–10.3)
Chloride: 100 mmol/L (ref 98–111)
Creatinine, Ser: 1.2 mg/dL — ABNORMAL HIGH (ref 0.44–1.00)
GFR calc Af Amer: 52 mL/min — ABNORMAL LOW (ref >60)
GFR calc non Af Amer: 45 mL/min — ABNORMAL LOW (ref >60)
Glucose, Bld: 122 mg/dL — ABNORMAL HIGH (ref 70–99)
Potassium: 4.1 mmol/L (ref 3.5–5.1)
Sodium: 137 mmol/L (ref 135–145)

## 2019-04-08 LAB — CBC
HCT: 43.6 % (ref 36.0–46.0)
Hemoglobin: 14.3 g/dL (ref 12.0–15.0)
MCH: 30.8 pg (ref 26.0–34.0)
MCHC: 32.8 g/dL (ref 30.0–36.0)
MCV: 93.8 fL (ref 80.0–100.0)
Platelets: 287 10*3/uL (ref 150–400)
RBC: 4.65 MIL/uL (ref 3.87–5.11)
RDW: 12.2 % (ref 11.5–15.5)
WBC: 11.1 10*3/uL — ABNORMAL HIGH (ref 4.0–10.5)
nRBC: 0 % (ref 0.0–0.2)

## 2019-04-08 NOTE — ED Notes (Signed)
Pt called for vitals. No reponse.

## 2019-04-08 NOTE — ED Triage Notes (Signed)
Pt reports she fell, hit her head and passed out on christmas eve. Pt since then has had dizziness and troubles with her memory. Pt a.o at this time. Pt is not on a blood thinner. Pt has pacemaker.

## 2019-04-09 ENCOUNTER — Ambulatory Visit (INDEPENDENT_AMBULATORY_CARE_PROVIDER_SITE_OTHER): Payer: Medicare HMO | Admitting: *Deleted

## 2019-04-09 DIAGNOSIS — S060X1A Concussion with loss of consciousness of 30 minutes or less, initial encounter: Secondary | ICD-10-CM | POA: Diagnosis not present

## 2019-04-09 DIAGNOSIS — I428 Other cardiomyopathies: Secondary | ICD-10-CM | POA: Diagnosis not present

## 2019-04-09 LAB — CUP PACEART REMOTE DEVICE CHECK
Battery Remaining Longevity: 35 mo
Battery Voltage: 2.91 V
Brady Statistic AP VP Percent: 0.01 %
Brady Statistic AP VS Percent: 9.27 %
Brady Statistic AS VP Percent: 0.02 %
Brady Statistic AS VS Percent: 90.7 %
Brady Statistic RA Percent Paced: 9.02 %
Brady Statistic RV Percent Paced: 0.03 %
Date Time Interrogation Session: 20201229073827
HighPow Impedance: 95 Ohm
Implantable Lead Implant Date: 20131212
Implantable Lead Implant Date: 20131212
Implantable Lead Location: 753859
Implantable Lead Location: 753860
Implantable Lead Model: 5076
Implantable Lead Model: 6935
Implantable Pulse Generator Implant Date: 20131212
Lead Channel Impedance Value: 532 Ohm
Lead Channel Impedance Value: 722 Ohm
Lead Channel Impedance Value: 893 Ohm
Lead Channel Pacing Threshold Amplitude: 0.375 V
Lead Channel Pacing Threshold Amplitude: 0.5 V
Lead Channel Pacing Threshold Pulse Width: 0.4 ms
Lead Channel Pacing Threshold Pulse Width: 0.4 ms
Lead Channel Sensing Intrinsic Amplitude: 12.125 mV
Lead Channel Sensing Intrinsic Amplitude: 12.125 mV
Lead Channel Sensing Intrinsic Amplitude: 5.125 mV
Lead Channel Sensing Intrinsic Amplitude: 5.125 mV
Lead Channel Setting Pacing Amplitude: 2 V
Lead Channel Setting Pacing Amplitude: 2.5 V
Lead Channel Setting Pacing Pulse Width: 0.4 ms
Lead Channel Setting Sensing Sensitivity: 0.3 mV

## 2019-04-09 MED ORDER — SODIUM CHLORIDE 0.9 % IV BOLUS
250.0000 mL | Freq: Once | INTRAVENOUS | Status: AC
  Administered 2019-04-09: 250 mL via INTRAVENOUS

## 2019-04-09 NOTE — ED Provider Notes (Signed)
Attestation: Medical screening examination/treatment/procedure(s) were conducted as a shared visit with non-physician practitioner(s) and myself.  I personally evaluated the patient during the encounter.  Briefly, the patient is a 73 y.o. female with h/o CHF on lasix, here for lightheadedness since hitting her head and falling 5 days ago. Symptoms worse with standing.   Vitals:   04/09/19 0238 04/09/19 0310  BP: 117/89 134/67  Pulse: 88 77  Resp: 18 14  Temp: 98 F (36.7 C)   SpO2: 95% 98%    CONSTITUTIONAL:  well-appearing, NAD NEURO:  Alert and oriented x 3, no focal deficits EYES:  pupils equal and reactive ENT/NECK:  trachea midline, no JVD CARDIO:  reg rate, reg rhythm, well-perfused PULM:  None labored breathing GI/GU:  Abdomin non-distended MSK/SPINE:  No gross deformities, no edema SKIN:  no rash, atraumatic PSYCH:  Appropriate speech and behavior    EKG Interpretation  Date/Time:  Monday April 08 2019 17:07:46 EST Ventricular Rate:  76 PR Interval:  140 QRS Duration: 132 QT Interval:  440 QTC Calculation: 495 R Axis:   -3 Text Interpretation: Normal sinus rhythm Non-specific intra-ventricular conduction block Cannot rule out Anterior infarct , age undetermined Abnormal ECG No significant change since last tracing Confirmed by Merrily Pew [phone]) on 04/08/2019 11:22:59 PM       Work up with negative CT head. Mild AKI. Patient symptomatic with orthostatics.   Given small IVF bolus, resolving orthostatic lightheadedness.  The patient is safe for discharge with strict return precautions.           Fatima Blank, MD 04/09/19 [phone]

## 2019-04-09 NOTE — ED Provider Notes (Signed)
[facility] EMERGENCY DEPARTMENT Provider Note   CSN: [id number] Arrival date & time: 04/08/19  1625     History Chief Complaint  Patient presents with  . Loss of Consciousness  . Dizziness  . Fall    Paula Booker is a 73 y.o. female with a history of WPW, and ICM, systolic congestive heart failure who presents to the emergency department with a chief complaint of fall.  The patient reports that she fell on 12/24 and hit the right side of her forehead on a cabinet in her bedroom.  She reports an associated loss of consciousness for an unknown amount of time.  Reports that after the fall that she was able to get up and get back to her bed.  She reports that she has been getting dizzy and lightheaded when changing from a sitting to a standing position for several days.  She reports that she stood up from a sitting position prior to the episode where she fell.  She reports that since the fall that she has been having some difficulty with word finding and will intermittently forget why she has walked into her room.  She thinks that she has been having to concentrate harder on objects, but she does continue to have intermittent dizziness that is worse with standing.  She reports that she has been more tired and has been sleeping more over the last few days. She denies diplopia, numbness, weakness, neck pain, chest pain, shortness of breath, fever, chills, abdominal pain, nausea, vomiting, or diarrhea.  Reports that her p.o. intake has been somewhat decreased over the last few days as she has been sleeping more.  The history is provided by the patient. No language interpreter was used.       Past Medical History:  Diagnosis Date  . Anemia    as a child  . Anxiety   . Arthritis    "hands and knees" (03/22/2012)  . Asthma   . CHF (congestive heart failure) ([place]) 6/13  . Depression   . GERD (gastroesophageal reflux disease)   . Head injury, acute, with loss of  consciousness ([place])   . Hypertension   . ICD (implantable cardiac defibrillator) in place Dec 2013  . Migraines    migraines   . NICM (nonischemic cardiomyopathy) ([place])   . Obesity (BMI 30-39.9)    negative sleep study 2014  . OSA (obstructive sleep apnea) 05/27/2018   Mild with AHI 6/hr  . Pacemaker   . Pneumonia    hx  . Shortness of breath    "@ any time I can get SOB" (03/22/2012)  . Skin cancer 1999  . WPW (Wolff-Parkinson-White syndrome)     Patient Active Problem List   Diagnosis Date Noted  . SVT (supraventricular tachycardia) ([place]) 11/02/2018  . ICD (implantable cardioverter-defibrillator) in place 11/02/2018  . OSA (obstructive sleep apnea) 05/27/2018  . Acute medial meniscus tear, left, initial encounter 11/29/2017  . Chondromalacia of left knee 11/29/2017  . Migraines 09/23/2017  . Anxiety 09/23/2017  . Depression 09/23/2017  . Pacemaker 09/23/2017  . Chronic systolic heart failure ([place]) 04/28/2015  . UTI (urinary tract infection) 03/22/2015  . Sepsis ([place]) 03/19/2015  . Acute kidney injury ([facility]) 03/19/2015  . Diarrhea 03/18/2015  . CAP (community acquired pneumonia) 03/18/2015  . Nausea vomiting and diarrhea 03/18/2015  . Compression fracture 06/05/2014  . Essential hypertension 03/27/2014  . Chest pain 01/27/2014  . Normal coronary arteries 01/27/2014  . Obesity (BMI 30-39.9)   .  ICD- MDT implanted Dec 2013 03/26/2013  . NICM (nonischemic cardiomyopathy) ([place]) 02/21/2013  . GERD (gastroesophageal reflux disease) 04/23/2012  . Asthma in adult, mild persistent, uncomplicated [id number]  . Shortness of breath 04/23/2012  . Syncope 03/01/2012  . Acute on chronic systolic CHF (congestive heart failure) ([place]) 03/01/2012  . Type B WPW syndrome- RFA Dec 2013 03/01/2012    Past Surgical History:  Procedure Laterality Date  . CARDIAC CATHETERIZATION  09/18/11   no significant CAD  . CARDIAC DEFIBRILLATOR PLACEMENT  03/22/2012   DDD  . CHOLECYSTECTOMY  ~ 2003   . IMPLANTABLE CARDIOVERTER DEFIBRILLATOR IMPLANT N/A 03/22/2012   Procedure: IMPLANTABLE CARDIOVERTER DEFIBRILLATOR IMPLANT;  Surgeon: Evans Lance, MD;  Location: [facility] CATH LAB;  Service: Cardiovascular;  Laterality: N/A;  . KNEE ARTHROSCOPY Left 11/29/2017   Procedure: ARTHROSCOPY LEFT KNEE;  Surgeon: Dorna Leitz, MD;  Location: [facility] ORS;  Service: Orthopedics;  Laterality: Left;  . KYPHOPLASTY N/A 11/27/2013   Procedure: KYPHOPLASTY;  Surgeon: Sinclair Ship, MD;  Location: [facility];  Service: Orthopedics;  Laterality: N/A;  T9, T11 kyphoplasty  . KYPHOPLASTY N/A 06/05/2014   Procedure: KYPHOPLASTY;  Surgeon: Sinclair Ship, MD;  Location: [facility];  Service: Orthopedics;  Laterality: N/A;  T7 kyphoplasty  . MOHS SURGERY  06/2011   "forehead" (03/22/2012)  . SKIN CANCER EXCISION  1999   "top of my head and my right back shoulder"  . SKIN CANCER EXCISION     back  . SKIN CANCER EXCISION     face  . SUPRAVENTRICULAR TACHYCARDIA ABLATION  03/22/2012   unable to induce VT/RN report 03/22/2012  . SUPRAVENTRICULAR TACHYCARDIA ABLATION N/A 03/22/2012   Procedure: SUPRAVENTRICULAR TACHYCARDIA ABLATION;  Surgeon: Evans Lance, MD;  Location: [facility] C[facility] LAB;  Service: Cardiovascular;  Laterality: N/A;  . TONSILLECTOMY AND ADENOIDECTOMY  1950  . TUBAL LIGATION  1978     OB History   No obstetric history on file.     Family History  Problem Relation Age of Onset  . Congestive Heart Failure Brother   . Atrial fibrillation Brother   . Congestive Heart Failure Mother        died 93  . Kidney Stones Mother   . Deafness Mother   . Atrial fibrillation Mother   . Congestive Heart Failure Father   . Deafness Father   . Healthy Sister   . Healthy Sister   . Healthy Sister   . Tuberculosis Paternal Grandfather     Social History   Tobacco Use  . Smoking status: Never Smoker  . Smokeless tobacco: Never Used  Substance Use Topics  . Alcohol use: No  . Drug use: No    Home  Medications Prior to Admission medications   Medication Sig Start Date End Date Taking? Authorizing Provider  albuterol (PROVENTIL HFA;VENTOLIN HFA) 108 (90 Base) MCG/ACT inhaler Inhale 2 puffs into the lungs every 6 (six) hours as needed for wheezing. For wheezing. Use 2 puffs 3 times daily x5 days and then every 6 hours as needed. Patient taking differently: Inhale 2 puffs into the lungs every 6 (six) hours as needed for wheezing.  01/18/18   Mannam, Hart Robinsons, MD  alendronate (FOSAMAX) 70 MG tablet Take 70 mg by mouth every Sunday.     [provider]  BREO ELLIPTA 200-25 MCG/INH AEPB INHALE 1 PUFF INTO THE LUNGS DAILY 02/25/19   Mannam, Praveen, MD  carvedilol (COREG) 12.5 MG tablet TAKE 1 TABLET BY MOUTH TWICE DAILY WITH A MEAL  Patient taking differently: Take 12.5 mg by mouth 2 (two) times daily with a meal.  07/04/18   Evans Lance, MD  cetirizine (ZYRTEC) 10 MG tablet Take 10 mg by mouth daily.    [provider]  Cholecalciferol (VITAMIN D3) 5000 units CAPS Take 5,000 Units by mouth daily.    [provider]  citalopram (CELEXA) 20 MG tablet Take 20 mg by mouth daily.     [provider]  Cyanocobalamin (VITAMIN B-12) 5000 MCG LOZG Take 5,000 mcg by mouth daily.    [provider]  fluticasone (FLONASE) 50 MCG/ACT nasal spray Place 2 sprays into both nostrils daily.    [provider]  furosemide (LASIX) 40 MG tablet TAKE 1 TO 2 TABLETS BY MOUTH AS DIRECTED ONCE DAILY FOR EDEMA, SHORTNESS OF BREATH, OR WEIGHT GAIN 02/21/19   Jettie Booze, MD  ipratropium-albuterol (DUONEB) 0.5-2.5 (3) MG/3ML SOLN Take 3 mLs by nebulization every 4 (four) hours as needed. Patient taking differently: Take 3 mLs by nebulization every 4 (four) hours as needed (for shortness of breath or wheezing).  10/09/17   Lauraine Rinne, NP  losartan (COZAAR) 25 MG tablet TAKE 1 TABLET BY MOUTH TWICE DAILY Patient taking differently: Take 25 mg by mouth 2 (two)  times daily.  04/09/18   Jettie Booze, MD  Multiple Vitamins-Minerals (CENTRUM SILVER 50+WOMEN) TABS Take 1 tablet by mouth daily with breakfast.     [provider]  pantoprazole (PROTONIX) 40 MG tablet Take 40 mg by mouth daily. 08/29/17   [provider]  spironolactone (ALDACTONE) 25 MG tablet TAKE 1 TABLET BY MOUTH ONCE DAILY Patient taking differently: Take 25 mg by mouth daily.  05/07/18   Jettie Booze, MD    Allergies    Ivp dye [iodinated diagnostic agents], Penicillins, Sulfonamide derivatives, Tessalon [benzonatate], Sulfa antibiotics, Rosuvastatin calcium, and Onion  Review of Systems   Review of Systems  Constitutional: Negative for activity change, chills and fever.  HENT: Negative for congestion and sore throat.   Respiratory: Negative for shortness of breath and wheezing.   Cardiovascular: Negative for chest pain, palpitations and leg swelling.  Gastrointestinal: Negative for abdominal pain, blood in stool, diarrhea, nausea and vomiting.  Genitourinary: Negative for dysuria, frequency and urgency.  Musculoskeletal: Negative for arthralgias, back pain, gait problem, myalgias and neck stiffness.  Skin: Negative for rash and wound.  Allergic/Immunologic: Negative for immunocompromised state.  Neurological: Positive for dizziness, syncope, light-headedness and headaches. Negative for weakness and numbness.  Psychiatric/Behavioral: Negative for confusion.    Physical Exam Updated Vital Signs BP 136/79 (BP Location: Left Arm)   Pulse 87   Temp 98 F (36.7 C) (Oral)   Resp 16   SpO2 96%   Physical Exam Vitals and nursing note reviewed.  Constitutional:      General: She is not in acute distress. HENT:     Head: Normocephalic and atraumatic.     Comments: Head is atraumatic. Eyes:     Extraocular Movements: Extraocular movements intact.     Conjunctiva/sclera: Conjunctivae normal.     Pupils: Pupils are equal, round, and reactive to  light.  Cardiovascular:     Rate and Rhythm: Normal rate and regular rhythm.     Heart sounds: No murmur. No friction rub. No gallop.   Pulmonary:     Effort: Pulmonary effort is normal. No respiratory distress.     Breath sounds: No stridor. No wheezing, rhonchi or rales.  Comments: Lungs are clear to auscultation bilaterally without increased work of breathing Chest:     Chest wall: No tenderness.  Abdominal:     General: There is no distension.     Palpations: Abdomen is soft. There is no mass.     Tenderness: There is no abdominal tenderness. There is no right CVA tenderness, left CVA tenderness, guarding or rebound.     Hernia: No hernia is present.  Musculoskeletal:     Cervical back: Neck supple.     Right lower leg: No edema.     Left lower leg: No edema.  Skin:    General: Skin is warm.     Findings: No rash.  Neurological:     Mental Status: She is alert and oriented to person, place, and time.     Comments: Cranial nerves II through XII are grossly intact.  No pronator drift.  Gait is not ataxic.  5 of 5 strength against resistance of the bilateral upper and lower extremities.  Answers questions appropriately.  Follows simple commands.  No dysmetria with finger-to-nose.  Psychiatric:        Behavior: Behavior normal.     ED Results / Procedures / Treatments   Labs (all labs ordered are listed, but only abnormal results are displayed) Labs Reviewed  BASIC METABOLIC PANEL - Abnormal; Notable for the following components:      Result Value   Glucose, Bld 122 (*)    Creatinine, Ser 1.20 (*)    GFR calc non Af Amer 45 (*)    GFR calc Af Amer 52 (*)    All other components within normal limits  CBC - Abnormal; Notable for the following components:   WBC 11.1 (*)    All other components within normal limits    EKG EKG Interpretation  Date/Time:  Monday April 08 2019 17:07:46 EST Ventricular Rate:  76 PR Interval:  140 QRS Duration: 132 QT  Interval:  440 QTC Calculation: 495 R Axis:   -3 Text Interpretation: Normal sinus rhythm Non-specific intra-ventricular conduction block Cannot rule out Anterior infarct , age undetermined Abnormal ECG No significant change since last tracing Confirmed by Merrily Pew [phone]) on 04/08/2019 11:22:59 PM   Radiology CT HEAD WO CONTRAST  Result Date: 04/08/2019 CLINICAL DATA:  Head trauma.  Fell 3 days ago.  Headache. EXAM: CT HEAD WITHOUT CONTRAST TECHNIQUE: Contiguous axial images were obtained from the base of the skull through the vertex without intravenous contrast. COMPARISON:  CT head 05/04/2018 FINDINGS: Brain: No evidence of acute infarction, hemorrhage, hydrocephalus, extra-axial collection or mass lesion/mass effect. Vascular: Negative for hyperdense vessel Skull: Negative for fracture Sinuses/Orbits: Negative Other: None IMPRESSION: Negative CT head Electronically Signed   By: Franchot Gallo M.D.   On: 04/08/2019 18:15    Procedures Procedures (including critical care time)  Medications Ordered in ED Medications  sodium chloride 0.9 % bolus 250 mL (0 mLs Intravenous Stopped 04/09/19 0515)    ED Course  I have reviewed the triage vital signs and the nursing notes.  Pertinent labs & imaging results that were available during my care of the patient were reviewed by me and considered in my medical decision making (see chart for details).    MDM Rules/Calculators/A&P                       73 year old female with a history of WPW, and ICM, systolic congestive heart failure who presents to the emergency department with a  chief complaint of fall with a loss of consciousness on 12/24.  She has no neurologic deficits on exam.  She does not appear volume overloaded.  She has been having dizziness and lightheadedness with standing over the last few days, which prompted her fall.  Symptoms are concerning for orthostatic hypotension.  Although her blood pressure remained stable with  standing, but this brought on her dizziness.  We will give a 250 cc fluid bolus since she does have a new, small AKI with a creatinine of 1.2, up from 0.83 in August.  Likely secondary to poor p.o. intake over the last few days as she has been sleeping more since the fall.  Following fluid bolus, will plan to ambulate the patient to make sure she is improved. CT head is unremarkable, doubt CVA.  Labs are otherwise unchanged from previous.  The patient was seen and independently evaluated by Dr. Leonette Monarch who is in agreement with work-up and plan.  Following fluid administration, she was ambulated and reports that she is feeling much better.  She is having some mild symptoms associated with the concussion.  We will refer her to the concussion clinic.  All questions answered.  She is hemodynamically stable and in no acute distress.  Safe for discharge to home with outpatient follow-up as needed.  Final Clinical Impression(s) / ED Diagnoses Final diagnoses:  Fall, initial encounter  Head injury with loss of consciousness ([place])  Concussion with loss of consciousness of 30 minutes or less, initial encounter  Orthostatic hypotension    Rx / DC Orders ED Discharge Orders    None       Joanne Gavel, PA-C 04/09/19 [place], MD 04/10/19 [phone]

## 2019-04-09 NOTE — Discharge Instructions (Addendum)
Thank you for allowing me to care for you today in the Emergency Department.   You can take Tylenol for headaches.  Keep a close follow-up with cardiology if you continue to have to take extra doses of your home Lasix.   Return to the emergency department if you fall and hit your head, if you develop chest pain, if your pacemaker fires, if you develop significant changes to your vision, vomiting, or other new, concerning symptoms.

## 2019-04-21 NOTE — Progress Notes (Signed)
Cardiology Office Note   Date:  04/22/2019   ID:  Paula Booker, DOB 01/14/1946, MRN [id number]  PCP:  [name], MD    No chief complaint on file.  NICM  Wt Readings from Last 3 Encounters:  04/22/19 205 lb 6.4 oz (93.2 kg)  11/12/18 200 lb 3.2 oz (90.8 kg)  11/02/18 197 lb 9.6 oz (89.6 kg)       History of Present Illness: Paula Booker is a 74 y.o. female  who has a nonischemic cardiomyopathy. She has had WPW diagnosed on ECG as well by Dr. Lovena Le. She has had ICD implant and EP study for WPW with no inducible arrhythmia.  Hospitalized in Dec 2016 for sepsis, UTI, pneumonia. SHe was in the hospital for about a week.  She has routine EP f/u on AICD.She is taking her Lasix regularly. If she skips it, she gets a call about fluid retention.  In the past, she had an asthma attack.  She was treated with steroids and nebulizer.  Sleep study ordered in 03/2018 for daytime somnolence.  Since the last visit, she has had trouble losing weight.    She does try to avoid processed food.   She banged her head on a shelf while standing up and lost consciousness, on 04/04/2019.  She called her PMD.  Eventually had a head CT- which showed no bleed in her head.  SHe did have a concussion.   Walks in her house.  Tries to get 5000 steps daily.    She had an abscessed tooth that was treated with ABx.  THe abscess resolved, but she will still may have this tooth pulled.  She has had this problem before but states she has not taken antibiotics before teeth pulling in the past and has done well.   Past Medical History:  Diagnosis Date  . Anemia    as a child  . Anxiety   . Arthritis    "hands and knees" (03/22/2012)  . Asthma   . CHF (congestive heart failure) ([place]) 6/13  . Depression   . GERD (gastroesophageal reflux disease)   . Head injury, acute, with loss of consciousness ([place])   . Hypertension   . ICD (implantable cardiac defibrillator) in place Dec 2013  .  Migraines    migraines   . NICM (nonischemic cardiomyopathy) ([place])   . Obesity (BMI 30-39.9)    negative sleep study 2014  . OSA (obstructive sleep apnea) 05/27/2018   Mild with AHI 6/hr  . Pacemaker   . Pneumonia    hx  . Shortness of breath    "@ any time I can get SOB" (03/22/2012)  . Skin cancer 1999  . WPW (Wolff-Parkinson-White syndrome)     Past Surgical History:  Procedure Laterality Date  . CARDIAC CATHETERIZATION  09/18/11   no significant CAD  . CARDIAC DEFIBRILLATOR PLACEMENT  03/22/2012   DDD  . CHOLECYSTECTOMY  ~ 2003  . IMPLANTABLE CARDIOVERTER DEFIBRILLATOR IMPLANT N/A 03/22/2012   Procedure: IMPLANTABLE CARDIOVERTER DEFIBRILLATOR IMPLANT;  Surgeon: Evans Lance, MD;  Location: [facility] CATH LAB;  Service: Cardiovascular;  Laterality: N/A;  . KNEE ARTHROSCOPY Left 11/29/2017   Procedure: ARTHROSCOPY LEFT KNEE;  Surgeon: Dorna Leitz, MD;  Location: [facility] OR[facility];  Service: Orthopedics;  Laterality: Left;  . KYPHOPLASTY N/A 11/27/2013   Procedure: KYPHOPLASTY;  Surgeon: Sinclair Ship, MD;  Location: [facility];  Service: Orthopedics;  Laterality: N/A;  T9, T11 kyphoplasty  . KYPHOPLASTY N/A 06/05/2014   Procedure:  KYPHOPLASTY;  Surgeon: Sinclair Ship, MD;  Location: [facility];  Service: Orthopedics;  Laterality: N/A;  T7 kyphoplasty  . MOHS SURGERY  06/2011   "forehead" (03/22/2012)  . SKIN CANCER EXCISION  1999   "top of my head and my right back shoulder"  . SKIN CANCER EXCISION     back  . SKIN CANCER EXCISION     face  . SUPRAVENTRICULAR TACHYCARDIA ABLATION  03/22/2012   unable to induce VT/RN report 03/22/2012  . SUPRAVENTRICULAR TACHYCARDIA ABLATION N/A 03/22/2012   Procedure: SUPRAVENTRICULAR TACHYCARDIA ABLATION;  Surgeon: Evans Lance, MD;  Location: [facility] CATH LAB;  Service: Cardiovascular;  Laterality: N/A;  . TONSILLECTOMY AND ADENOIDECTOMY  1950  . TUBAL LIGATION  1978     Current Outpatient Medications  Medication Sig Dispense Refill  . albuterol  (PROVENTIL HFA;VENTOLIN HFA) 108 (90 Base) MCG/ACT inhaler Inhale 2 puffs into the lungs every 6 (six) hours as needed for wheezing. For wheezing. Use 2 puffs 3 times daily x5 days and then every 6 hours as needed. (Patient taking differently: Inhale 2 puffs into the lungs every 6 (six) hours as needed for wheezing. ) 1 Inhaler 5  . alendronate (FOSAMAX) 70 MG tablet Take 70 mg by mouth every Sunday.     Marland Kitchen BREO ELLIPTA 200-25 MCG/INH AEPB INHALE 1 PUFF INTO THE LUNGS DAILY 60 each 0  . carvedilol (COREG) 12.5 MG tablet TAKE 1 TABLET BY MOUTH TWICE DAILY WITH A MEAL (Patient taking differently: Take 12.5 mg by mouth 2 (two) times daily with a meal. ) 180 tablet 3  . cetirizine (ZYRTEC) 10 MG tablet Take 10 mg by mouth daily.    . Cholecalciferol (VITAMIN D3) 5000 units CAPS Take 5,000 Units by mouth daily.    . citalopram (CELEXA) 20 MG tablet Take 20 mg by mouth daily.     . Cyanocobalamin (VITAMIN B-12) 5000 MCG LOZG Take 5,000 mcg by mouth daily.    . fluticasone (FLONASE) 50 MCG/ACT nasal spray Place 2 sprays into both nostrils daily.    . furosemide (LASIX) 40 MG tablet TAKE 1 TO 2 TABLETS BY MOUTH AS DIRECTED ONCE DAILY FOR EDEMA, SHORTNESS OF BREATH, OR WEIGHT GAIN (Patient taking differently: Take 80 mg by mouth daily. Takes 2 tablets every morning.) 180 tablet 2  . ipratropium-albuterol (DUONEB) 0.5-2.5 (3) MG/3ML SOLN Take 3 mLs by nebulization every 4 (four) hours as needed. (Patient taking differently: Take 3 mLs by nebulization every 4 (four) hours as needed (for shortness of breath or wheezing). ) 360 mL 1  . losartan (COZAAR) 25 MG tablet TAKE 1 TABLET BY MOUTH TWICE DAILY (Patient taking differently: Take 25 mg by mouth 2 (two) times daily. ) 180 tablet 3  . Multiple Vitamins-Minerals (CENTRUM SILVER 50+WOMEN) TABS Take 1 tablet by mouth daily with breakfast.     . pantoprazole (PROTONIX) 40 MG tablet Take 40 mg by mouth daily.  0  . spironolactone (ALDACTONE) 25 MG tablet TAKE 1 TABLET  BY MOUTH ONCE DAILY (Patient taking differently: Take 25 mg by mouth daily. ) 90 tablet 3   No current facility-administered medications for this visit.    Allergies:   Ivp dye [iodinated diagnostic agents], Penicillins, Sulfonamide derivatives, Tessalon [benzonatate], Sulfa antibiotics, Rosuvastatin calcium, and Onion    Social History:  The patient  reports that she has never smoked. She has never used smokeless tobacco. She reports that she does not drink alcohol or use drugs.   Family History:  The patient's  family history includes Atrial fibrillation in her brother and mother; Congestive Heart Failure in her brother, father, and mother; Deafness in her father and mother; Healthy in her sister, sister, and sister; Kidney Stones in her mother; Tuberculosis in her paternal grandfather.    ROS:  Please see the history of present illness.   Otherwise, review of systems are positive for difficulty losing weight.   All other systems are reviewed and negative.    PHYSICAL EXAM: VS:  BP 126/76   Pulse 95   Ht 5\' 4"  (1.626 m)   Wt 205 lb 6.4 oz (93.2 kg)   SpO2 95%   BMI 35.26 kg/m  , BMI Body mass index is 35.26 kg/m. GEN: Well nourished, well developed, in no acute distress  HEENT: normal  Neck: no JVD, carotid bruits, or masses Cardiac: RRR; no murmurs, rubs, or gallops,no edema  Respiratory:  clear to auscultation bilaterally, normal work of breathing GI: soft, nontender, nondistended, + BS MS: no deformity or atrophy  Skin: warm and dry, no rash Neuro:  Strength and sensation are intact Psych: euthymic mood, full affect   EKG:   The ekg ordered 03/2019 demonstrates NSR, LBBB   Recent Labs: 05/04/2018: ALT 15 11/11/2018: B Natriuretic Peptide 55.7; Magnesium 1.9 04/08/2019: BUN 19; Creatinine, Ser 1.20; Hemoglobin 14.3; Platelets 287; Potassium 4.1; Sodium 137   Lipid Panel No results found for: CHOL, TRIG, HDL, CHOLHDL, VLDL, LDLCALC, LDLDIRECT   Other studies  Reviewed: Additional studies/ records that were reviewed today with results demonstrating: hospital records reviewed.   ASSESSMENT AND PLAN:  1. Chronic systolic heart failure/NICM: Appears euvolemic.  COntinue aggressive secondary prevention.  EF 20-25% in 2016.  NYHA class I at this time. 2. HTN: The current medical regimen is effective;  continue present plan and medications.  Healthy diet recommended. 3. Leg edema: resolved.   4. AICD: Followed with EP.  No sx of SVT.   Current medicines are reviewed at length with the patient today.  The patient concerns regarding her medicines were addressed.  The following changes have been made:  No change  Labs/ tests ordered today include:  No orders of the defined types were placed in this encounter.   Recommend 150 minutes/week of aerobic exercise Low fat, low carb, high fiber diet recommended  Disposition:   FU in 1 year   Signed, Larae Grooms, MD  04/22/2019 8:21 AM    [facility] [place], [place], [place]  [address] Phone: [phone]; Fax: [phone]

## 2019-04-22 ENCOUNTER — Encounter: Payer: Self-pay | Admitting: Interventional Cardiology

## 2019-04-22 ENCOUNTER — Ambulatory Visit (INDEPENDENT_AMBULATORY_CARE_PROVIDER_SITE_OTHER): Payer: Medicare HMO | Admitting: Interventional Cardiology

## 2019-04-22 ENCOUNTER — Other Ambulatory Visit: Payer: Self-pay

## 2019-04-22 VITALS — BP 126/76 | HR 95 | Ht 64.0 in | Wt 205.4 lb

## 2019-04-22 DIAGNOSIS — I428 Other cardiomyopathies: Secondary | ICD-10-CM | POA: Diagnosis not present

## 2019-04-22 DIAGNOSIS — I471 Supraventricular tachycardia: Secondary | ICD-10-CM

## 2019-04-22 DIAGNOSIS — I5022 Chronic systolic (congestive) heart failure: Secondary | ICD-10-CM

## 2019-04-22 DIAGNOSIS — G4733 Obstructive sleep apnea (adult) (pediatric): Secondary | ICD-10-CM

## 2019-04-22 DIAGNOSIS — Z95810 Presence of automatic (implantable) cardiac defibrillator: Secondary | ICD-10-CM | POA: Diagnosis not present

## 2019-04-22 DIAGNOSIS — I10 Essential (primary) hypertension: Secondary | ICD-10-CM | POA: Diagnosis not present

## 2019-04-22 NOTE — Patient Instructions (Signed)
Medication Instructions:  Your physician recommends that you continue on your current medications as directed. Please refer to the Current Medication list given to you today.  *If you need a refill on your cardiac medications before your next appointment, please call your pharmacy*  Lab Work: Your physician recommends that you return for a FASTING lipid profile and liver function panel  If you have labs (blood work) drawn today and your tests are completely normal, you will receive your results only by: Marland Kitchen MyChart Message (if you have MyChart) OR . A paper copy in the mail If you have any lab test that is abnormal or we need to change your treatment, we will call you to review the results.  Testing/Procedures: None ordered  Follow-Up: At [facility], you and your health needs are our priority.  As part of our continuing mission to provide you with exceptional heart care, we have created designated Provider Care Teams.  These Care Teams include your primary Cardiologist (physician) and Advanced Practice Providers (APPs -  Physician Assistants and Nurse Practitioners) who all work together to provide you with the care you need, when you need it.  Your next appointment:   12 month(s)  The format for your next appointment:   In Person  Provider:   You may see Larae Grooms, MD or one of the following Advanced Practice Providers on your designated Care Team:    Melina Copa, PA-C  Ermalinda Barrios, PA-C   Other Instructions

## 2019-04-25 ENCOUNTER — Other Ambulatory Visit: Payer: Self-pay | Admitting: Interventional Cardiology

## 2019-04-26 ENCOUNTER — Other Ambulatory Visit: Payer: Self-pay

## 2019-04-26 ENCOUNTER — Other Ambulatory Visit: Payer: Medicare HMO | Admitting: *Deleted

## 2019-04-26 DIAGNOSIS — I5022 Chronic systolic (congestive) heart failure: Secondary | ICD-10-CM | POA: Diagnosis not present

## 2019-04-26 DIAGNOSIS — I471 Supraventricular tachycardia: Secondary | ICD-10-CM

## 2019-04-26 DIAGNOSIS — G4733 Obstructive sleep apnea (adult) (pediatric): Secondary | ICD-10-CM | POA: Diagnosis not present

## 2019-04-26 DIAGNOSIS — I10 Essential (primary) hypertension: Secondary | ICD-10-CM

## 2019-04-26 DIAGNOSIS — I428 Other cardiomyopathies: Secondary | ICD-10-CM | POA: Diagnosis not present

## 2019-04-26 DIAGNOSIS — Z95810 Presence of automatic (implantable) cardiac defibrillator: Secondary | ICD-10-CM | POA: Diagnosis not present

## 2019-04-26 LAB — HEPATIC FUNCTION PANEL
ALT: 11 IU/L (ref 0–32)
AST: 19 IU/L (ref 0–40)
Albumin: 4.2 g/dL (ref 3.7–4.7)
Alkaline Phosphatase: 94 IU/L (ref 39–117)
Bilirubin Total: 0.3 mg/dL (ref 0.0–1.2)
Bilirubin, Direct: 0.1 mg/dL (ref 0.00–0.40)
Total Protein: 6.8 g/dL (ref 6.0–8.5)

## 2019-04-26 LAB — LIPID PANEL
Chol/HDL Ratio: 4.3 ratio (ref 0.0–4.4)
Cholesterol, Total: 186 mg/dL (ref 100–199)
HDL: 43 mg/dL (ref >39)
LDL Chol Calc (NIH): 121 mg/dL — ABNORMAL HIGH (ref 0–99)
Triglycerides: 122 mg/dL (ref 0–149)
VLDL Cholesterol Cal: 22 mg/dL (ref 5–40)

## 2019-05-07 ENCOUNTER — Telehealth: Payer: Self-pay

## 2019-05-07 NOTE — Telephone Encounter (Signed)
Left message for patient to remind of missed remote transmission.  

## 2019-05-13 NOTE — Progress Notes (Signed)
No ICM remote transmission received for 05/06/2019 and next ICM transmission scheduled for 06/03/2019.

## 2019-05-21 ENCOUNTER — Other Ambulatory Visit: Payer: Self-pay | Admitting: Interventional Cardiology

## 2019-05-27 NOTE — Progress Notes (Deleted)
Electrophysiology Office Note Date: 05/27/2019  ID:  Paula Booker, DOB Feb 19, 1946, MRN [id number]  PCP: [name], MD Primary Cardiologist: Larae Grooms, MD Electrophysiologist: Dr. Lovena Le   CC: Routine ICD follow-up  Paula Booker is a 74 y.o. female seen today to discuss barostim consideration. Since last being seen in our clinic, the patient reports doing very well. They deny chest pain, palpitations, dyspnea, PND, orthopnea, nausea, vomiting, dizziness, syncope, edema, weight gain, or early satiety.  {He/she (caps):30048} has not had ICD shocks.   Device History: Medtronic Dual Chamber ICD implanted 2013 for primary prevention History of appropriate therapy: No History of AAD therapy: No   Past Medical History:  Diagnosis Date  . Anemia    as a child  . Anxiety   . Arthritis    "hands and knees" (03/22/2012)  . Asthma   . CHF (congestive heart failure) ([place]) 6/13  . Depression   . GERD (gastroesophageal reflux disease)   . Head injury, acute, with loss of consciousness ([place])   . Hypertension   . ICD (implantable cardiac defibrillator) in place Dec 2013  . Migraines    migraines   . NICM (nonischemic cardiomyopathy) ([place])   . Obesity (BMI 30-39.9)    negative sleep study 2014  . OSA (obstructive sleep apnea) 05/27/2018   Mild with AHI 6/hr  . Pacemaker   . Pneumonia    hx  . Shortness of breath    "@ any time I can get SOB" (03/22/2012)  . Skin cancer 1999  . WPW (Wolff-Parkinson-White syndrome)    Past Surgical History:  Procedure Laterality Date  . CARDIAC CATHETERIZATION  09/18/11   no significant CAD  . CARDIAC DEFIBRILLATOR PLACEMENT  03/22/2012   DDD  . CHOLECYSTECTOMY  ~ 2003  . IMPLANTABLE CARDIOVERTER DEFIBRILLATOR IMPLANT N/A 03/22/2012   Procedure: IMPLANTABLE CARDIOVERTER DEFIBRILLATOR IMPLANT;  Surgeon: Evans Lance, MD;  Location: [facility] CATH LAB;  Service: Cardiovascular;  Laterality: N/A;  . KNEE ARTHROSCOPY Left 11/29/2017   Procedure: ARTHROSCOPY LEFT KNEE;  Surgeon: Dorna Leitz, MD;  Location: [facility];  Service: Orthopedics;  Laterality: Left;  . KYPHOPLASTY N/A 11/27/2013   Procedure: KYPHOPLASTY;  Surgeon: Sinclair Ship, MD;  Location: [facility];  Service: Orthopedics;  Laterality: N/A;  T9, T11 kyphoplasty  . KYPHOPLASTY N/A 06/05/2014   Procedure: KYPHOPLASTY;  Surgeon: Sinclair Ship, MD;  Location: [facility];  Service: Orthopedics;  Laterality: N/A;  T7 kyphoplasty  . MOHS SURGERY  06/2011   "forehead" (03/22/2012)  . SKIN CANCER EXCISION  1999   "top of my head and my right back shoulder"  . SKIN CANCER EXCISION     back  . SKIN CANCER EXCISION     face  . SUPRAVENTRICULAR TACHYCARDIA ABLATION  03/22/2012   unable to induce VT/RN report 03/22/2012  . SUPRAVENTRICULAR TACHYCARDIA ABLATION N/A 03/22/2012   Procedure: SUPRAVENTRICULAR TACHYCARDIA ABLATION;  Surgeon: Evans Lance, MD;  Location: [facility] CATH LAB;  Service: Cardiovascular;  Laterality: N/A;  . TONSILLECTOMY AND ADENOIDECTOMY  1950  . TUBAL LIGATION  1978    Current Outpatient Medications  Medication Sig Dispense Refill  . albuterol (PROVENTIL HFA;VENTOLIN HFA) 108 (90 Base) MCG/ACT inhaler Inhale 2 puffs into the lungs every 6 (six) hours as needed for wheezing. For wheezing. Use 2 puffs 3 times daily x5 days and then every 6 hours as needed. (Patient taking differently: Inhale 2 puffs into the lungs every 6 (six) hours as needed for wheezing. ) 1 Inhaler 5  .  alendronate (FOSAMAX) 70 MG tablet Take 70 mg by mouth every Sunday.     Marland Kitchen BREO ELLIPTA 200-25 MCG/INH AEPB INHALE 1 PUFF INTO THE LUNGS DAILY 60 each 0  . carvedilol (COREG) 12.5 MG tablet TAKE 1 TABLET BY MOUTH TWICE DAILY WITH A MEAL (Patient taking differently: Take 12.5 mg by mouth 2 (two) times daily with a meal. ) 180 tablet 3  . cetirizine (ZYRTEC) 10 MG tablet Take 10 mg by mouth daily.    . Cholecalciferol (VITAMIN D3) 5000 units CAPS Take 5,000 Units by mouth daily.      . citalopram (CELEXA) 20 MG tablet Take 20 mg by mouth daily.     . Cyanocobalamin (VITAMIN B-12) 5000 MCG LOZG Take 5,000 mcg by mouth daily.    . fluticasone (FLONASE) 50 MCG/ACT nasal spray Place 2 sprays into both nostrils daily.    . furosemide (LASIX) 40 MG tablet TAKE 1 TO 2 TABLETS BY MOUTH AS DIRECTED ONCE DAILY FOR EDEMA, SHORTNESS OF BREATH, OR WEIGHT GAIN (Patient taking differently: Take 80 mg by mouth daily. Takes 2 tablets every morning.) 180 tablet 2  . ipratropium-albuterol (DUONEB) 0.5-2.5 (3) MG/3ML SOLN Take 3 mLs by nebulization every 4 (four) hours as needed. (Patient taking differently: Take 3 mLs by nebulization every 4 (four) hours as needed (for shortness of breath or wheezing). ) 360 mL 1  . losartan (COZAAR) 25 MG tablet Take 1 tablet by mouth twice daily 180 tablet 3  . Multiple Vitamins-Minerals (CENTRUM SILVER 50+WOMEN) TABS Take 1 tablet by mouth daily with breakfast.     . pantoprazole (PROTONIX) 40 MG tablet Take 40 mg by mouth daily.  0  . spironolactone (ALDACTONE) 25 MG tablet Take 1 tablet by mouth once daily 90 tablet 3   No current facility-administered medications for this visit.    Allergies:   Ivp dye [iodinated diagnostic agents], Penicillins, Sulfonamide derivatives, Tessalon [benzonatate], Sulfa antibiotics, Rosuvastatin calcium, and Onion   Social History: Social History   Socioeconomic History  . Marital status: Divorced    Spouse name: Not on file  . Number of children: 3  . Years of education: Not on file  . Highest education level: Not on file  Occupational History  . Occupation: retired    Comment: Optometrist  Tobacco Use  . Smoking status: Never Smoker  . Smokeless tobacco: Never Used  Substance and Sexual Activity  . Alcohol use: No  . Drug use: No  . Sexual activity: Not on file  Other Topics Concern  . Not on file  Social History Narrative   Divorced, 3 children, 9 grandchildren   Social Determinants of Health    Financial Resource Strain:   . Difficulty of Paying Living Expenses: Not on file  Food Insecurity:   . Worried About Charity fundraiser in the Last Year: Not on file  . Ran Out of Food in the Last Year: Not on file  Transportation Needs:   . Lack of Transportation (Medical): Not on file  . Lack of Transportation (Non-Medical): Not on file  Physical Activity:   . Days of Exercise per Week: Not on file  . Minutes of Exercise per Session: Not on file  Stress:   . Feeling of Stress : Not on file  Social Connections:   . Frequency of Communication with Friends and Family: Not on file  . Frequency of Social Gatherings with Friends and Family: Not on file  . Attends Religious Services: Not on file  .  Active Member of Clubs or Organizations: Not on file  . Attends Archivist Meetings: Not on file  . Marital Status: Not on file  Intimate Partner Violence:   . Fear of Current or Ex-Partner: Not on file  . Emotionally Abused: Not on file  . Physically Abused: Not on file  . Sexually Abused: Not on file    Family History: Family History  Problem Relation Age of Onset  . Congestive Heart Failure Brother   . Atrial fibrillation Brother   . Congestive Heart Failure Mother        died 26  . Kidney Stones Mother   . Deafness Mother   . Atrial fibrillation Mother   . Congestive Heart Failure Father   . Deafness Father   . Healthy Sister   . Healthy Sister   . Healthy Sister   . Tuberculosis Paternal Grandfather     Review of Systems: All other systems reviewed and are otherwise negative except as noted above.   Physical Exam: There were no vitals filed for this visit.   GEN- The patient is well appearing, alert and oriented x 3 today.   HEENT: normocephalic, atraumatic; sclera clear, conjunctiva pink; hearing intact; oropharynx clear; neck supple, no JVP Lymph- no cervical lymphadenopathy Lungs- Clear to ausculation bilaterally, normal work of breathing.  No  wheezes, rales, rhonchi Heart- Regular rate and rhythm, no murmurs, rubs or gallops, PMI not laterally displaced GI- soft, non-tender, non-distended, bowel sounds present, no hepatosplenomegaly Extremities- no clubbing, cyanosis, or edema; DP/PT/radial pulses 2+ bilaterally MS- no significant deformity or atrophy Skin- warm and dry, no rash or lesion; ICD pocket well healed Psych- euthymic mood, full affect Neuro- strength and sensation are intact  ICD interrogation- reviewed in detail today,  See PACEART report  EKG:  EKG is ordered today. The ekg ordered today shows ***  Recent Labs: 11/11/2018: B Natriuretic Peptide 55.7; Magnesium 1.9 04/08/2019: BUN 19; Creatinine, Ser 1.20; Hemoglobin 14.3; Platelets 287; Potassium 4.1; Sodium 137 04/26/2019: ALT 11   Wt Readings from Last 3 Encounters:  04/22/19 205 lb 6.4 oz (93.2 kg)  11/12/18 200 lb 3.2 oz (90.8 kg)  11/02/18 197 lb 9.6 oz (89.6 kg)     Other studies Reviewed: Additional studies/ records that were reviewed today include: ***   Assessment and Plan:  1.  Chronic systolic dysfunction s/p Medtronic dual chamber ICD  euvolemic today Stable on an appropriate medical regimen Normal ICD function See Pace Art report No changes today QRS *** There is no height or weight on file to calculate BMI.  NYHA ***  2. Obesity  ***  Current medicines are reviewed at length with the patient today.   The patient {ACTIONS; HAS/DOES NOT HAVE:19233} concerns regarding her medicines.  The following changes were made today:  {NONE DEFAULTED:18576::"none"}  Labs/ tests ordered today include: *** No orders of the defined types were placed in this encounter.    Disposition:   Follow up with *** {gen number [id number] {TIME; UNITS DAY/WEEK/MONTH:19136}   Signed, Shirley Friar, PA-C  05/27/2019 2:28 PM  [place] [place] [address] [phone] (office) [phone]  (fax)

## 2019-05-28 ENCOUNTER — Encounter: Payer: Medicare HMO | Admitting: Student

## 2019-06-03 ENCOUNTER — Ambulatory Visit (INDEPENDENT_AMBULATORY_CARE_PROVIDER_SITE_OTHER): Payer: Medicare HMO

## 2019-06-03 DIAGNOSIS — Z95810 Presence of automatic (implantable) cardiac defibrillator: Secondary | ICD-10-CM | POA: Diagnosis not present

## 2019-06-03 DIAGNOSIS — I5022 Chronic systolic (congestive) heart failure: Secondary | ICD-10-CM | POA: Diagnosis not present

## 2019-06-03 DIAGNOSIS — M1711 Unilateral primary osteoarthritis, right knee: Secondary | ICD-10-CM | POA: Diagnosis not present

## 2019-06-03 DIAGNOSIS — M25561 Pain in right knee: Secondary | ICD-10-CM | POA: Diagnosis not present

## 2019-06-05 ENCOUNTER — Telehealth: Payer: Self-pay

## 2019-06-05 NOTE — Telephone Encounter (Signed)
Remote ICM transmission received.  Attempted call to patient regarding ICM remote transmission and left detailed message per DPR.  Advised to return call for any fluid symptoms or questions. Next ICM remote transmission scheduled 07/10/2019.

## 2019-06-05 NOTE — Progress Notes (Signed)
EPIC Encounter for ICM Monitoring  Patient Name: Paula Booker is a 74 y.o. female Date: 06/05/2019 Primary Care Physican: [name], MD Primary [place] Electrophysiologist: Lovena Le LastWeight:196 lbs   Spoke with patient and she feels like she is doing well. Discussed limiting salt intake.  Optivol thoracic impedancenormal.  Prescribed:Furosemide40 mgtake2 tablets(80 mg total)by mouth in the morning and an additional 40 mg once daily as needed for fluid retention  Labs: 04/08/2019 Creatinine 1.20, BUN 19, Potassium 4.1, Sodium 137, GFR 45-52 11/11/2018 Creatinine 0.83, BUN 18, Potassium 3.7, Sodium 137, GFR>60 05/04/2018 Creatinine 0.95, BUN 18, Potassium 3.7, Sodium 134, GFR 60  Recommendations: No changes and encouraged to call if experiencing any fluid symptoms.  Follow-up plan: ICM clinic phone appointment on 07/10/2019. 91 day device clinic remote transmission 07/09/2019.   Copy of ICM check sent to Dr.Taylor.   3 month ICM trend: 06/03/2019    1 Year ICM trend:       [name], RN 06/05/2019 12:54 PM

## 2019-06-11 NOTE — Progress Notes (Signed)
Electrophysiology Office Note Date: 06/12/2019  ID:  Paula Booker, DOB [birth year], MRN [id number]  PCP: [name], MD Primary Cardiologist: Larae Grooms, MD Electrophysiologist: Cristopher Peru, MD   CC: Routine ICD follow-up  Paula Booker is a 74 y.o. female seen today for Dr. Lovena Le.  They present today for routine electrophysiology followup.  Since last being seen in our clinic, the patient reports doing very well. She is her USOH. She has mild SOB with moderate or more exertion, but this is stable chronically in the setting of Asthma. She denies chest pain, palpitations, PND, orthopnea, nausea, vomiting, dizziness, syncope, edema, weight gain, or early satiety.   Device History: Medtronic ICD implanted 03/2012 for primary prevention with syncope and WPW pattern on ECG. EPS with no inducible arryhtmias.   Past Medical History:  Diagnosis Date  . Anemia    as a child  . Anxiety   . Arthritis    "hands and knees" (03/22/2012)  . Asthma   . CHF (congestive heart failure) ([place]) 6/13  . Depression   . GERD (gastroesophageal reflux disease)   . Head injury, acute, with loss of consciousness ([place])   . Hypertension   . ICD (implantable cardiac defibrillator) in place Dec 2013  . Migraines    migraines   . NICM (nonischemic cardiomyopathy) ([place])   . Obesity (BMI 30-39.9)    negative sleep study 2014  . OSA (obstructive sleep apnea) 05/27/2018   Mild with AHI 6/hr  . Pacemaker   . Pneumonia    hx  . Shortness of breath    "@ any time I can get SOB" (03/22/2012)  . Skin cancer 1999  . WPW (Wolff-Parkinson-White syndrome)    Past Surgical History:  Procedure Laterality Date  . CARDIAC CATHETERIZATION  09/18/11   no significant CAD  . CARDIAC DEFIBRILLATOR PLACEMENT  03/22/2012   DDD  . CHOLECYSTECTOMY  ~ 2003  . IMPLANTABLE CARDIOVERTER DEFIBRILLATOR IMPLANT N/A 03/22/2012   Procedure: IMPLANTABLE CARDIOVERTER DEFIBRILLATOR IMPLANT;  Surgeon: Evans Lance, MD;   Location: [facility] CATH LAB;  Service: Cardiovascular;  Laterality: N/A;  . KNEE ARTHROSCOPY Left 11/29/2017   Procedure: ARTHROSCOPY LEFT KNEE;  Surgeon: Dorna Leitz, MD;  Location: [facility] ORS;  Service: Orthopedics;  Laterality: Left;  . KYPHOPLASTY N/A 11/27/2013   Procedure: KYPHOPLASTY;  Surgeon: Sinclair Ship, MD;  Location: [place];  Service: Orthopedics;  Laterality: N/A;  T9, T11 kyphoplasty  . KYPHOPLASTY N/A 06/05/2014   Procedure: KYPHOPLASTY;  Surgeon: Sinclair Ship, MD;  Location: [place];  Service: Orthopedics;  Laterality: N/A;  T7 kyphoplasty  . MOHS SURGERY  06/2011   "forehead" (03/22/2012)  . SKIN CANCER EXCISION  1999   "top of my head and my right back shoulder"  . SKIN CANCER EXCISION     back  . SKIN CANCER EXCISION     face  . SUPRAVENTRICULAR TACHYCARDIA ABLATION  03/22/2012   unable to induce VT/RN report 03/22/2012  . SUPRAVENTRICULAR TACHYCARDIA ABLATION N/A 03/22/2012   Procedure: SUPRAVENTRICULAR TACHYCARDIA ABLATION;  Surgeon: Evans Lance, MD;  Location: [facility] CATH LAB;  Service: Cardiovascular;  Laterality: N/A;  . TONSILLECTOMY AND ADENOIDECTOMY  1950  . TUBAL LIGATION  1978    Current Outpatient Medications  Medication Sig Dispense Refill  . albuterol (PROVENTIL HFA;VENTOLIN HFA) 108 (90 Base) MCG/ACT inhaler Inhale 2 puffs into the lungs every 6 (six) hours as needed for wheezing. For wheezing. Use 2 puffs 3 times daily x5 days and then every 6  hours as needed. (Patient taking differently: Inhale 2 puffs into the lungs every 6 (six) hours as needed for wheezing. ) 1 Inhaler 5  . alendronate (FOSAMAX) 70 MG tablet Take 70 mg by mouth every Sunday.     Marland Kitchen BREO ELLIPTA 200-25 MCG/INH AEPB INHALE 1 PUFF INTO THE LUNGS DAILY 60 each 0  . carvedilol (COREG) 12.5 MG tablet TAKE 1 TABLET BY MOUTH TWICE DAILY WITH A MEAL (Patient taking differently: Take 12.5 mg by mouth 2 (two) times daily with a meal. ) 180 tablet 3  . cetirizine (ZYRTEC) 10 MG tablet Take 10  mg by mouth daily.    . Cholecalciferol (VITAMIN D3) 5000 units CAPS Take 5,000 Units by mouth daily.    . citalopram (CELEXA) 20 MG tablet Take 20 mg by mouth daily.     . Cyanocobalamin (VITAMIN B-12) 5000 MCG LOZG Take 5,000 mcg by mouth daily.    . fluticasone (FLONASE) 50 MCG/ACT nasal spray Place 2 sprays into both nostrils daily.    . furosemide (LASIX) 40 MG tablet TAKE 1 TO 2 TABLETS BY MOUTH AS DIRECTED ONCE DAILY FOR EDEMA, SHORTNESS OF BREATH, OR WEIGHT GAIN (Patient taking differently: Take 80 mg by mouth daily. Takes 2 tablets every morning.) 180 tablet 2  . ipratropium-albuterol (DUONEB) 0.5-2.5 (3) MG/3ML SOLN Take 3 mLs by nebulization every 4 (four) hours as needed. (Patient taking differently: Take 3 mLs by nebulization every 4 (four) hours as needed (for shortness of breath or wheezing). ) 360 mL 1  . losartan (COZAAR) 25 MG tablet Take 1 tablet by mouth twice daily 180 tablet 3  . Multiple Vitamins-Minerals (CENTRUM SILVER 50+WOMEN) TABS Take 1 tablet by mouth daily with breakfast.     . pantoprazole (PROTONIX) 40 MG tablet Take 40 mg by mouth daily.  0  . spironolactone (ALDACTONE) 25 MG tablet Take 1 tablet by mouth once daily 90 tablet 3   No current facility-administered medications for this visit.    Allergies:   Ivp dye [iodinated diagnostic agents], Penicillins, Sulfonamide derivatives, Tessalon [benzonatate], Sulfa antibiotics, Rosuvastatin calcium, and Onion   Social History: Social History   Socioeconomic History  . Marital status: Divorced    Spouse name: Not on file  . Number of children: 3  . Years of education: Not on file  . Highest education level: Not on file  Occupational History  . Occupation: retired    Comment: Optometrist  Tobacco Use  . Smoking status: Never Smoker  . Smokeless tobacco: Never Used  Substance and Sexual Activity  . Alcohol use: No  . Drug use: No  . Sexual activity: Not on file  Other Topics Concern  . Not on file   Social History Narrative   Divorced, 3 children, 9 grandchildren   Social Determinants of Health   Financial Resource Strain:   . Difficulty of Paying Living Expenses: Not on file  Food Insecurity:   . Worried About Charity fundraiser in the Last Year: Not on file  . Ran Out of Food in the Last Year: Not on file  Transportation Needs:   . Lack of Transportation (Medical): Not on file  . Lack of Transportation (Non-Medical): Not on file  Physical Activity:   . Days of Exercise per Week: Not on file  . Minutes of Exercise per Session: Not on file  Stress:   . Feeling of Stress : Not on file  Social Connections:   . Frequency of Communication with Friends and Family:  Not on file  . Frequency of Social Gatherings with Friends and Family: Not on file  . Attends Religious Services: Not on file  . Active Member of Clubs or Organizations: Not on file  . Attends Archivist Meetings: Not on file  . Marital Status: Not on file  Intimate Partner Violence:   . Fear of Current or Ex-Partner: Not on file  . Emotionally Abused: Not on file  . Physically Abused: Not on file  . Sexually Abused: Not on file    Family History: Family History  Problem Relation Age of Onset  . Congestive Heart Failure Brother   . Atrial fibrillation Brother   . Congestive Heart Failure Mother        died 27  . Kidney Stones Mother   . Deafness Mother   . Atrial fibrillation Mother   . Congestive Heart Failure Father   . Deafness Father   . Healthy Sister   . Healthy Sister   . Healthy Sister   . Tuberculosis Paternal Grandfather     Review of Systems: All other systems reviewed and are otherwise negative except as noted above.   Physical Exam: Vitals:   06/12/19 0818  BP: 120/78  Pulse: 67  Weight: 199 lb (90.3 kg)  Height: 5\' 4"  (1.626 m)     GEN- The patient is well appearing, alert and oriented x 3 today.   HEENT: normocephalic, atraumatic; sclera clear, conjunctiva pink;  hearing intact; oropharynx clear; neck supple, no JVP Lymph- no cervical lymphadenopathy Lungs- Clear to ausculation bilaterally, normal work of breathing.  No wheezes, rales, rhonchi Heart- Regular rate and rhythm, no murmurs, rubs or gallops, PMI not laterally displaced GI- soft, non-tender, non-distended, bowel sounds present, no hepatosplenomegaly Extremities- no clubbing, cyanosis, or edema; DP/PT/radial pulses 2+ bilaterally MS- no significant deformity or atrophy Skin- warm and dry, no rash or lesion; ICD pocket well healed Psych- euthymic mood, full affect Neuro- strength and sensation are intact  ICD interrogation- reviewed in detail today,  See PACEART report  EKG:  EKG is ordered today. The ekg ordered today shows NSR at 67 bpm with normal PR interval and QRS of 122 ms. Occasional PVCs  Recent Labs: 11/11/2018: B Natriuretic Peptide 55.7; Magnesium 1.9 04/08/2019: BUN 19; Creatinine, Ser 1.20; Hemoglobin 14.3; Platelets 287; Potassium 4.1; Sodium 137 04/26/2019: ALT 11   Wt Readings from Last 3 Encounters:  06/12/19 199 lb (90.3 kg)  04/22/19 205 lb 6.4 oz (93.2 kg)  11/12/18 200 lb 3.2 oz (90.8 kg)     Other studies Reviewed: Additional studies/ records that were reviewed today include: Most recent echo, previous EP notes, most recent labwork, Most previous ICD remotes   Assessment and Plan:  1.  Chronic systolic dysfunction s/p Medtronic single chamber ICD  Most recent Echo 2016 showed LVEF 20-25%. Will update euvolemic today Stable on an appropriate medical regimen Normal ICD function See Pace Art report No changes today NYHA 1-2 symptoms at this time. Continue to follow via ICM GDMT: she is on coreg, losartan, and spironolactone. QRS 122 ms Her NYHA symptoms currently exclude her from barostim candidacy Will update echo and check Pro-BNP today to further evaluate for candidacy in the future.   2. HTN Stable on current medications  Current medicines are  reviewed at length with the patient today.   The patient does not have concerns regarding her medicines.  The following changes were made today:  none  Labs/ tests ordered today include:  Orders Placed  This Encounter  Procedures  . Basic Metabolic Panel (BMET)  . Pro b natriuretic peptide (BNP)  . EKG 12-Lead  . ECHOCARDIOGRAM COMPLETE    Disposition:   Follow up with Dr. Lovena Le as scheduled.   Jacalyn Lefevre, PA-C  06/12/2019 8:44 AM  [facility] [address] [place] [address] [phone] (office) [phone] (fax)

## 2019-06-12 ENCOUNTER — Other Ambulatory Visit: Payer: Self-pay

## 2019-06-12 ENCOUNTER — Encounter: Payer: Self-pay | Admitting: Student

## 2019-06-12 ENCOUNTER — Ambulatory Visit (INDEPENDENT_AMBULATORY_CARE_PROVIDER_SITE_OTHER): Payer: Medicare HMO | Admitting: Student

## 2019-06-12 VITALS — BP 120/78 | HR 67 | Ht 64.0 in | Wt 199.0 lb

## 2019-06-12 DIAGNOSIS — I428 Other cardiomyopathies: Secondary | ICD-10-CM | POA: Diagnosis not present

## 2019-06-12 DIAGNOSIS — I471 Supraventricular tachycardia: Secondary | ICD-10-CM

## 2019-06-12 DIAGNOSIS — I5022 Chronic systolic (congestive) heart failure: Secondary | ICD-10-CM

## 2019-06-12 DIAGNOSIS — I10 Essential (primary) hypertension: Secondary | ICD-10-CM | POA: Diagnosis not present

## 2019-06-12 LAB — BASIC METABOLIC PANEL
BUN/Creatinine Ratio: 22 (ref 12–28)
BUN: 18 mg/dL (ref 8–27)
CO2: 27 mmol/L (ref 20–29)
Calcium: 9.7 mg/dL (ref 8.7–10.3)
Chloride: 100 mmol/L (ref 96–106)
Creatinine, Ser: 0.81 mg/dL (ref 0.57–1.00)
GFR calc Af Amer: 83 mL/min/{1.73_m2} (ref >59)
GFR calc non Af Amer: 72 mL/min/{1.73_m2} (ref >59)
Glucose: 112 mg/dL — ABNORMAL HIGH (ref 65–99)
Potassium: 4.2 mmol/L (ref 3.5–5.2)
Sodium: 140 mmol/L (ref 134–144)

## 2019-06-12 LAB — PRO B NATRIURETIC PEPTIDE: NT-Pro BNP: 329 pg/mL — ABNORMAL HIGH (ref 0–301)

## 2019-06-12 NOTE — Patient Instructions (Addendum)
Medication Instructions:  none *If you need a refill on your cardiac medications before your next appointment, please call your pharmacy*   Lab Work: TODAY BMET BNP If you have labs (blood work) drawn today and your tests are completely normal, you will receive your results only by: Marland Kitchen MyChart Message (if you have MyChart) OR . A paper copy in the mail If you have any lab test that is abnormal or we need to change your treatment, we will call you to review the results.   Testing/Procedures:  Please schedule Your physician has requested that you have an echocardiogram. Echocardiography is a painless test that uses sound waves to create images of your heart. It provides your doctor with information about the size and shape of your heart and how well your heart's chambers and valves are working. This procedure takes approximately one hour. There are no restrictions for this procedure.     Follow-Up: At [facility], you and your health needs are our priority.  As part of our continuing mission to provide you with exceptional heart care, we have created designated Provider Care Teams.  These Care Teams include your primary Cardiologist (physician) and Advanced Practice Providers (APPs -  Physician Assistants and Nurse Practitioners) who all work together to provide you with the care you need, when you need it.     Your next appointment:  Dr Lovena Le in July 2021   Other Instructions Remote monitoring is used to monitor your ICD from home. This monitoring reduces the number of office visits required to check your device to one time per year. It allows [place] to keep an eye on the functioning of your device to ensure it is working properly. You are scheduled for a device check from home on 07/09/19. You may send your transmission at any time that day. If you have a wireless device, the transmission will be sent automatically. After your physician reviews your transmission, you will receive a postcard  with your next transmission date.

## 2019-06-17 DIAGNOSIS — M25561 Pain in right knee: Secondary | ICD-10-CM | POA: Diagnosis not present

## 2019-06-17 DIAGNOSIS — M25562 Pain in left knee: Secondary | ICD-10-CM | POA: Diagnosis not present

## 2019-06-19 ENCOUNTER — Other Ambulatory Visit (HOSPITAL_COMMUNITY): Payer: Self-pay | Admitting: Orthopedic Surgery

## 2019-06-19 ENCOUNTER — Encounter: Payer: Self-pay | Admitting: *Deleted

## 2019-06-19 ENCOUNTER — Other Ambulatory Visit: Payer: Self-pay | Admitting: Orthopedic Surgery

## 2019-06-19 DIAGNOSIS — M25561 Pain in right knee: Secondary | ICD-10-CM

## 2019-06-24 ENCOUNTER — Ambulatory Visit (HOSPITAL_COMMUNITY)
Admission: RE | Admit: 2019-06-24 | Discharge: 2019-06-24 | Disposition: A | Discharge status: Home / Self Care | Payer: Medicare HMO | Source: Ambulatory Visit | Attending: Orthopedic Surgery | Admitting: Orthopedic Surgery

## 2019-06-24 ENCOUNTER — Other Ambulatory Visit: Payer: Self-pay

## 2019-06-24 ENCOUNTER — Encounter (HOSPITAL_COMMUNITY): Payer: Self-pay

## 2019-06-24 DIAGNOSIS — M25561 Pain in right knee: Secondary | ICD-10-CM

## 2019-07-01 ENCOUNTER — Ambulatory Visit (HOSPITAL_COMMUNITY): Payer: Medicare HMO | Attending: Cardiovascular Disease

## 2019-07-01 ENCOUNTER — Other Ambulatory Visit: Payer: Self-pay

## 2019-07-01 DIAGNOSIS — I5022 Chronic systolic (congestive) heart failure: Secondary | ICD-10-CM | POA: Diagnosis not present

## 2019-07-01 DIAGNOSIS — I428 Other cardiomyopathies: Secondary | ICD-10-CM | POA: Diagnosis not present

## 2019-07-01 DIAGNOSIS — I471 Supraventricular tachycardia: Secondary | ICD-10-CM | POA: Diagnosis not present

## 2019-07-01 DIAGNOSIS — I10 Essential (primary) hypertension: Secondary | ICD-10-CM | POA: Insufficient documentation

## 2019-07-02 ENCOUNTER — Telehealth: Payer: Self-pay

## 2019-07-02 ENCOUNTER — Other Ambulatory Visit: Payer: Self-pay

## 2019-07-02 ENCOUNTER — Other Ambulatory Visit: Payer: Self-pay | Admitting: Orthopedic Surgery

## 2019-07-02 DIAGNOSIS — M25561 Pain in right knee: Secondary | ICD-10-CM

## 2019-07-02 NOTE — Telephone Encounter (Signed)
-----   Message from Shirley Friar, PA-C sent at 07/01/2019  3:28 PM EDT ----- Please let her know that her Echo is stable; her heart remains week; but stable.   Legrand Como [address] [place], [place]  07/01/2019 3:28 PM

## 2019-07-02 NOTE — Telephone Encounter (Signed)
-----   Message from Shirley Friar, PA-C sent at 07/01/2019  3:28 PM EDT ----- Please let her know that her Echo is stable; her heart remains week; but stable.   Legrand Como [address]" [place], [place]  07/01/2019 3:28 PM

## 2019-07-02 NOTE — Telephone Encounter (Signed)
[id number] 3/23 Echo results

## 2019-07-02 NOTE — Telephone Encounter (Signed)
The patient has been notified of the Echo result and verbalized understanding.  All questions (if any) were answered. Frederik Schmidt, RN 07/02/2019 8:14 AM

## 2019-07-04 ENCOUNTER — Other Ambulatory Visit: Payer: Self-pay

## 2019-07-04 ENCOUNTER — Ambulatory Visit (INDEPENDENT_AMBULATORY_CARE_PROVIDER_SITE_OTHER): Payer: Medicare HMO | Admitting: Physician Assistant

## 2019-07-04 ENCOUNTER — Encounter: Payer: Self-pay | Admitting: Physician Assistant

## 2019-07-04 DIAGNOSIS — L570 Actinic keratosis: Secondary | ICD-10-CM | POA: Diagnosis not present

## 2019-07-04 NOTE — Progress Notes (Signed)
   Follow-Up Visit   Subjective  Paula Booker is a 74 y.o. female who presents for the following: Skin Problem (Right nose x 1-2 months getting larger).  Location: right side cheek, nose Duration: weeks Quality: pink scale Associated Signs/Symptoms: Modifying Factors: persistent  The following portions of the chart were reviewed this encounter and updated as appropriate: Tobacco  Allergies  Meds  Problems  Med Hx  Surg Hx  Fam Hx      Objective  Well appearing patient in no apparent distress; mood and affect are within normal limits.  A focused examination was performed including face. Relevant physical exam findings are noted in the Assessment and Plan.  Objective  Right Malar Cheek, Right Nasofacial Sulcus: Erythematous patches with gritty scale.   Assessment & Plan  AK (actinic keratosis) (2) Right Nasofacial Sulcus; Right Malar Cheek  Destruction of lesion - Right Malar Cheek, Right Nasofacial Sulcus Complexity: simple   Destruction method: cryotherapy   Informed consent: discussed and consent obtained   Timeout:  patient name, date of birth, surgical site, and procedure verified Lesion destroyed using liquid nitrogen: Yes   Cryotherapy cycles:  1 Outcome: patient tolerated procedure well with no complications   Post-procedure details: wound care instructions given

## 2019-07-04 NOTE — Patient Instructions (Signed)

## 2019-07-08 DIAGNOSIS — M1000 Idiopathic gout, unspecified site: Secondary | ICD-10-CM | POA: Diagnosis not present

## 2019-07-09 ENCOUNTER — Ambulatory Visit (INDEPENDENT_AMBULATORY_CARE_PROVIDER_SITE_OTHER): Payer: Medicare HMO | Admitting: *Deleted

## 2019-07-09 DIAGNOSIS — I5022 Chronic systolic (congestive) heart failure: Secondary | ICD-10-CM | POA: Diagnosis not present

## 2019-07-09 LAB — CUP PACEART REMOTE DEVICE CHECK
Battery Remaining Longevity: 30 mo
Battery Voltage: 2.9 V
Brady Statistic AP VP Percent: 0.03 %
Brady Statistic AP VS Percent: 29.06 %
Brady Statistic AS VP Percent: 0.02 %
Brady Statistic AS VS Percent: 70.88 %
Brady Statistic RA Percent Paced: 27.28 %
Brady Statistic RV Percent Paced: 0.05 %
Date Time Interrogation Session: 20210330043627
HighPow Impedance: 87 Ohm
Implantable Lead Implant Date: 20131212
Implantable Lead Implant Date: 20131212
Implantable Lead Location: 753859
Implantable Lead Location: 753860
Implantable Lead Model: 5076
Implantable Lead Model: 6935
Implantable Pulse Generator Implant Date: 20131212
Lead Channel Impedance Value: 475 Ohm
Lead Channel Impedance Value: 646 Ohm
Lead Channel Impedance Value: 817 Ohm
Lead Channel Pacing Threshold Amplitude: 0.5 V
Lead Channel Pacing Threshold Amplitude: 0.5 V
Lead Channel Pacing Threshold Pulse Width: 0.4 ms
Lead Channel Pacing Threshold Pulse Width: 0.4 ms
Lead Channel Sensing Intrinsic Amplitude: 3.375 mV
Lead Channel Sensing Intrinsic Amplitude: 3.375 mV
Lead Channel Sensing Intrinsic Amplitude: 8 mV
Lead Channel Sensing Intrinsic Amplitude: 8 mV
Lead Channel Setting Pacing Amplitude: 2 V
Lead Channel Setting Pacing Amplitude: 2.5 V
Lead Channel Setting Pacing Pulse Width: 0.4 ms
Lead Channel Setting Sensing Sensitivity: 0.3 mV

## 2019-07-09 NOTE — Progress Notes (Signed)
ICD remote 

## 2019-07-10 ENCOUNTER — Ambulatory Visit (INDEPENDENT_AMBULATORY_CARE_PROVIDER_SITE_OTHER): Payer: Medicare HMO

## 2019-07-10 DIAGNOSIS — I5022 Chronic systolic (congestive) heart failure: Secondary | ICD-10-CM | POA: Diagnosis not present

## 2019-07-10 DIAGNOSIS — Z95810 Presence of automatic (implantable) cardiac defibrillator: Secondary | ICD-10-CM | POA: Diagnosis not present

## 2019-07-12 ENCOUNTER — Ambulatory Visit
Admission: RE | Admit: 2019-07-12 | Discharge: 2019-07-12 | Disposition: A | Discharge status: Home / Self Care | Payer: Medicare HMO | Source: Ambulatory Visit | Attending: Orthopedic Surgery | Admitting: Orthopedic Surgery

## 2019-07-12 ENCOUNTER — Other Ambulatory Visit: Payer: Self-pay

## 2019-07-12 DIAGNOSIS — M25561 Pain in right knee: Secondary | ICD-10-CM

## 2019-07-12 DIAGNOSIS — M25461 Effusion, right knee: Secondary | ICD-10-CM | POA: Diagnosis not present

## 2019-07-15 DIAGNOSIS — S83241A Other tear of medial meniscus, current injury, right knee, initial encounter: Secondary | ICD-10-CM | POA: Diagnosis not present

## 2019-07-15 DIAGNOSIS — M25561 Pain in right knee: Secondary | ICD-10-CM | POA: Diagnosis not present

## 2019-07-15 NOTE — Progress Notes (Signed)
EPIC Encounter for ICM Monitoring  Patient Name: Paula Booker is a 74 y.o. female Date: 07/15/2019 Primary Care Physican: [name], MD Primary [place] Electrophysiologist: Lovena Le LastWeight:196 lbs   Transmission reviewed.   Optivol thoracic impedancenormal.  Prescribed:Furosemide40 mgtake2 tablets(80 mg total)by mouth in the morning and an additional 40 mg once daily as needed for fluid retention  Labs: 06/12/2019 Creatinine 0.81, BUN 18, Potassium 4.2, Sodium 140, GFR 72-83 04/08/2019 Creatinine 1.20, BUN 19, Potassium 4.1, Sodium 137, GFR 45-52 11/11/2018 Creatinine 0.83, BUN 18, Potassium 3.7, Sodium 137, GFR>60 05/04/2018 Creatinine 0.95, BUN 18, Potassium 3.7, Sodium 134, GFR 60  Recommendations: None  Follow-up plan: ICM clinic phone appointment on5/07/2019. 91 day device clinic remote transmission 10/08/2019.   Copy of ICM check sent to Dr.Taylor.  3 month ICM trend: 07/09/2019    1 Year ICM trend:       [name], RN 07/15/2019 12:56 PM

## 2019-07-16 ENCOUNTER — Other Ambulatory Visit: Payer: Self-pay | Admitting: Orthopedic Surgery

## 2019-07-19 ENCOUNTER — Other Ambulatory Visit: Payer: Self-pay | Admitting: Internal Medicine

## 2019-07-19 DIAGNOSIS — I428 Other cardiomyopathies: Secondary | ICD-10-CM

## 2019-07-22 ENCOUNTER — Other Ambulatory Visit: Payer: Self-pay | Admitting: Family Medicine

## 2019-07-22 DIAGNOSIS — Z1231 Encounter for screening mammogram for malignant neoplasm of breast: Secondary | ICD-10-CM

## 2019-07-26 DIAGNOSIS — Z87828 Personal history of other (healed) physical injury and trauma: Secondary | ICD-10-CM | POA: Diagnosis not present

## 2019-07-26 DIAGNOSIS — J4520 Mild intermittent asthma, uncomplicated: Secondary | ICD-10-CM | POA: Diagnosis not present

## 2019-07-26 DIAGNOSIS — I428 Other cardiomyopathies: Secondary | ICD-10-CM | POA: Diagnosis not present

## 2019-07-26 DIAGNOSIS — M810 Age-related osteoporosis without current pathological fracture: Secondary | ICD-10-CM | POA: Diagnosis not present

## 2019-07-30 NOTE — Progress Notes (Signed)
PCP - [name] Cardiologist -  Dr. Smitty Cords 06-12-19 epic  Chest x-ray - 11-11-18 epic EKG - 06-12-19 epic Stress Test -  ECHO - 07-01-19 epic Cardiac Cath -  Last device check 07-09-19 epic Orders on chrt  Sleep Study -  CPAP -   Fasting Blood Sugar -  Checks Blood Sugar _____ times a day  Blood Thinner Instructions: Aspirin Instructions: Last Dose:  Anesthesia review: OSA mild, chf ef 25-30%, ICD,WPW, asthma  Patient denies shortness of breath, fever, cough and chest pain at PAT appointment   NONE   Patient verbalized understanding of instructions that were given to them at the PAT appointment. Patient was also instructed that they will need to review over the PAT instructions again at home before surgery.

## 2019-07-30 NOTE — Patient Instructions (Addendum)
DUE TO COVID-19 ONLY ONE VISITOR IS ALLOWED TO COME WITH YOU AND STAY IN THE WAITING ROOM ONLY DURING PRE OP AND PROCEDURE DAY OF SURGERY. TWO  VISITOR MAY VISIT WITH YOU AFTER SURGERY IN YOUR PRIVATE ROOM DURING VISITING HOURS ONLY!   10a-8-p  YOU NEED TO HAVE A COVID 19 TEST ON__4-27-21_____ @_1 :00 pm______, THIS TEST MUST BE DONE BEFORE SURGERY, COME  [address], [place] , [address].  ([place]) ONCE YOUR COVID TEST IS COMPLETED, PLEASE BEGIN THE QUARANTINE INSTRUCTIONS AS OUTLINED IN YOUR HANDOUT.                Paula Booker  07/30/2019   Your procedure is scheduled on:   08-09-19   Report to [facility] Main  Entrance   Report to  Short stay  at       0530  AM     Call this number if you have problems the morning of surgery [phone]    Remember: NO SOLID FOOD AFTER MIDNIGHT THE NIGHT PRIOR TO SURGERY. NOTHING BY MOUTH EXCEPT CLEAR LIQUIDS UNTIL   0415 am  . PLEASE FINISH ENSURE DRINK PER SURGEON ORDER  WHICH NEEDS TO BE COMPLETED AT    [place] am then nothing by mouth.  BRUSH YOUR TEETH MORNING OF SURGERY AND RINSE YOUR MOUTH OUT, NO CHEWING GUM CANDY OR MINTS.     Take these medicines the morning of surgery with A SIP OF WATER: protonix, nebulizer if needed, flonase, celexa, zyrtec, carvedilol, inhalers bring with you                                 You may not have any metal on your body including hair pins and              piercings  Do not wear jewelry, make-up, lotions, powders or perfumes, deodorant             Do not wear nail polish on your fingernails.  Do not shave  48 hours prior to surgery.                Do not bring valuables to the hospital. [place].  Contacts, dentures or bridgework may not be worn into surgery.  Leave suitcase in the car. After surgery it may be brought to your room.     Patients discharged the day of surgery will not be allowed to drive home. IF YOU  ARE HAVING SURGERY AND GOING HOME THE SAME DAY, YOU MUST HAVE AN ADULT TO DRIVE YOU HOME AND BE WITH YOU FOR 24 HOURS. YOU MAY GO HOME BY TAXI OR UBER OR ORTHERWISE, BUT AN ADULT MUST ACCOMPANY YOU HOME AND STAY WITH YOU FOR 24 HOURS.  Name and phone number of your driver:  Special Instructions: N/A              Please read over the following fact sheets you were given: _____________________________________________________________________             [facility] - Preparing for Surgery Before surgery, you can play an important role.  Because skin is not sterile, your skin needs to be as free of germs as possible.  You can reduce the number of germs on your skin by washing with CHG (chlorahexidine gluconate)  soap before surgery.  CHG is an antiseptic cleaner which kills germs and bonds with the skin to continue killing germs even after washing. Please DO NOT use if you have an allergy to CHG or antibacterial soaps.  If your skin becomes reddened/irritated stop using the CHG and inform your nurse when you arrive at Short Stay. Do not shave (including legs and underarms) for at least 48 hours prior to the first CHG shower.  You may shave your face/neck. Please follow these instructions carefully:  1.  Shower with CHG Soap the night before surgery and the  morning of Surgery.  2.  If you choose to wash your hair, wash your hair first as usual with your  normal  shampoo.  3.  After you shampoo, rinse your hair and body thoroughly to remove the  shampoo.                           4.  Use CHG as you would any other liquid soap.  You can apply chg directly  to the skin and wash                       Gently with a scrungie or clean washcloth.  5.  Apply the CHG Soap to your body ONLY FROM THE NECK DOWN.   Do not use on face/ open                           Wound or open sores. Avoid contact with eyes, ears mouth and genitals (private parts).                       Wash face,  Genitals (private parts) with  your normal soap.             6.  Wash thoroughly, paying special attention to the area where your surgery  will be performed.  7.  Thoroughly rinse your body with warm water from the neck down.  8.  DO NOT shower/wash with your normal soap after using and rinsing off  the CHG Soap.                9.  Pat yourself dry with a clean towel.            10.  Wear clean pajamas.            11.  Place clean sheets on your bed the night of your first shower and do not  sleep with pets. Day of Surgery : Do not apply any lotions/deodorants the morning of surgery.  Please wear clean clothes to the hospital/surgery center.  FAILURE TO FOLLOW THESE INSTRUCTIONS MAY RESULT IN THE CANCELLATION OF YOUR SURGERY PATIENT SIGNATURE_________________________________  NURSE SIGNATURE__________________________________  ________________________________________________________________________   Paula Booker  An incentive spirometer is a tool that can help keep your lungs clear and active. This tool measures how well you are filling your lungs with each breath. Taking long deep breaths may help reverse or decrease the chance of developing breathing (pulmonary) problems (especially infection) following:  A long period of time when you are unable to move or be active. BEFORE THE PROCEDURE   If the spirometer includes an indicator to show your best effort, your nurse or respiratory therapist will set it to a desired goal.  If possible, sit up straight or lean slightly forward. Try  not to slouch.  Hold the incentive spirometer in an upright position. INSTRUCTIONS FOR USE  1. Sit on the edge of your bed if possible, or sit up as far as you can in bed or on a chair. 2. Hold the incentive spirometer in an upright position. 3. Breathe out normally. 4. Place the mouthpiece in your mouth and seal your lips tightly around it. 5. Breathe in slowly and as deeply as possible, raising the piston or the ball toward  the top of the column. 6. Hold your breath for 3-5 seconds or for as long as possible. Allow the piston or ball to fall to the bottom of the column. 7. Remove the mouthpiece from your mouth and breathe out normally. 8. Rest for a few seconds and repeat Steps 1 through 7 at least 10 times every 1-2 hours when you are awake. Take your time and take a few normal breaths between deep breaths. 9. The spirometer may include an indicator to show your best effort. Use the indicator as a goal to work toward during each repetition. 10. After each set of 10 deep breaths, practice coughing to be sure your lungs are clear. If you have an incision (the cut made at the time of surgery), support your incision when coughing by placing a pillow or rolled up towels firmly against it. Once you are able to get out of bed, walk around indoors and cough well. You may stop using the incentive spirometer when instructed by your caregiver.  RISKS AND COMPLICATIONS  Take your time so you do not get dizzy or light-headed.  If you are in pain, you may need to take or ask for pain medication before doing incentive spirometry. It is harder to take a deep breath if you are having pain. AFTER USE  Rest and breathe slowly and easily.  It can be helpful to keep track of a log of your progress. Your caregiver can provide you with a simple table to help with this. If you are using the spirometer at home, follow these instructions: [place] IF:   You are having difficultly using the spirometer.  You have trouble using the spirometer as often as instructed.  Your pain medication is not giving enough relief while using the spirometer.  You develop fever of 100.5 F (38.1 C) or higher. SEEK IMMEDIATE MEDICAL CARE IF:   You cough up bloody sputum that had not been present before.  You develop fever of 102 F (38.9 C) or greater.  You develop worsening pain at or near the incision site. MAKE SURE YOU:    Understand these instructions.  Will watch your condition.  Will get help right away if you are not doing well or get worse. Document Released: 08/08/2006 Document Revised: 06/20/2011 Document Reviewed: 10/09/2006 [facility] Patient Information 2014 [place], [place].   ________________________________________________________________________

## 2019-07-31 ENCOUNTER — Encounter (HOSPITAL_COMMUNITY): Payer: Self-pay

## 2019-07-31 ENCOUNTER — Encounter (HOSPITAL_COMMUNITY)
Admission: RE | Admit: 2019-07-31 | Discharge: 2019-07-31 | Disposition: A | Discharge status: Home / Self Care | Payer: Medicare HMO | Source: Ambulatory Visit | Attending: Orthopedic Surgery | Admitting: Orthopedic Surgery

## 2019-07-31 ENCOUNTER — Encounter: Payer: Self-pay | Admitting: Internal Medicine

## 2019-07-31 ENCOUNTER — Other Ambulatory Visit: Payer: Self-pay

## 2019-07-31 DIAGNOSIS — Z95810 Presence of automatic (implantable) cardiac defibrillator: Secondary | ICD-10-CM | POA: Insufficient documentation

## 2019-07-31 DIAGNOSIS — I10 Essential (primary) hypertension: Secondary | ICD-10-CM | POA: Insufficient documentation

## 2019-07-31 DIAGNOSIS — Z01812 Encounter for preprocedural laboratory examination: Secondary | ICD-10-CM | POA: Diagnosis not present

## 2019-07-31 DIAGNOSIS — X58XXXA Exposure to other specified factors, initial encounter: Secondary | ICD-10-CM | POA: Insufficient documentation

## 2019-07-31 DIAGNOSIS — Z79899 Other long term (current) drug therapy: Secondary | ICD-10-CM | POA: Diagnosis not present

## 2019-07-31 DIAGNOSIS — S83241A Other tear of medial meniscus, current injury, right knee, initial encounter: Secondary | ICD-10-CM | POA: Diagnosis not present

## 2019-07-31 DIAGNOSIS — G4733 Obstructive sleep apnea (adult) (pediatric): Secondary | ICD-10-CM | POA: Insufficient documentation

## 2019-07-31 HISTORY — DX: Personal history of other diseases of the digestive system: Z87.19

## 2019-07-31 HISTORY — DX: Presence of automatic (implantable) cardiac defibrillator: Z95.810

## 2019-07-31 HISTORY — DX: Cardiac arrhythmia, unspecified: I49.9

## 2019-07-31 HISTORY — DX: Cardiac arrhythmia, unspecified: Q87.89

## 2019-07-31 HISTORY — DX: Bursopathy, unspecified: M71.9

## 2019-07-31 LAB — BASIC METABOLIC PANEL
Anion gap: 10 (ref 5–15)
BUN: 18 mg/dL (ref 8–23)
CO2: 27 mmol/L (ref 22–32)
Calcium: 9.4 mg/dL (ref 8.9–10.3)
Chloride: 103 mmol/L (ref 98–111)
Creatinine, Ser: 0.84 mg/dL (ref 0.44–1.00)
GFR calc Af Amer: >60 mL/min (ref >60)
GFR calc non Af Amer: >60 mL/min (ref >60)
Glucose, Bld: 157 mg/dL — ABNORMAL HIGH (ref 70–99)
Potassium: 4.4 mmol/L (ref 3.5–5.1)
Sodium: 140 mmol/L (ref 135–145)

## 2019-07-31 LAB — CBC
HCT: 40.4 % (ref 36.0–46.0)
Hemoglobin: 13.3 g/dL (ref 12.0–15.0)
MCH: 31.2 pg (ref 26.0–34.0)
MCHC: 32.9 g/dL (ref 30.0–36.0)
MCV: 94.8 fL (ref 80.0–100.0)
Platelets: 224 10*3/uL (ref 150–400)
RBC: 4.26 MIL/uL (ref 3.87–5.11)
RDW: 12.6 % (ref 11.5–15.5)
WBC: 7 10*3/uL (ref 4.0–10.5)
nRBC: 0 % (ref 0.0–0.2)

## 2019-07-31 NOTE — Progress Notes (Signed)
[place] DEVICE PROGRAMMING  Patient Information: Name:  RANDINE ASKEY  DOB:  03/05/1946  MRN:  [id number]    Planned Procedure: Right knee arthroscopy  Surgeon: Dorna Leitz  Date of Procedure: 08/09/19  Cautery will be used. N/A  Position during surgery: N/A   Please send documentation back to:  [facility] (Fax # [phone])    Device Information:  Clinic EP Physician:  Cristopher Peru, MD  Device Type:  Defibrillator Manufacturer and Phone #:  [employer]: [phone] Pacemaker Dependent?:  No. Date of Last Device Check:  07/09/2019 Normal Device Function?:  Yes.    Electrophysiologist's Recommendations:   Have magnet available.  Provide continuous ECG monitoring when magnet is used or reprogramming is to be performed.   Procedure should not interfere with device function.  No device programming or magnet placement needed.  Per Device Clinic [address] [place], [place]  1:24 PM 07/31/2019

## 2019-08-05 ENCOUNTER — Telehealth: Payer: Self-pay | Admitting: *Deleted

## 2019-08-05 NOTE — Progress Notes (Addendum)
Anesthesia Chart Review   Case: [id number] Date/Time: 08/09/19 0700   Procedure: ARTHROSCOPY RIGHT KNEE (Right Knee)   Anesthesia type: Choice   Pre-op diagnosis: RIGHT KNEE MEDIAL MENISCUS TEAR   Location: [facility] ROOM 08 / [facility]   Surgeons: Dorna Leitz, MD      DISCUSSION:73 y.o. never smoker with h/o GERD, asthma, HTN, OSA, NICM (EF 25-30% on Echo 07/01/2019), h/o WPW, AICD in place (device orders on chart), right knee medial meniscus tear scheduled for above procedure 08/09/19 with Dr. Dorna Leitz.  Pt last seen by cardiologist, Dr. Larae Grooms, 04/22/2019.  Per OV note euvolemic, NYHA class I at that time, HTN well controlled, no sx of SVT.  Follow up in 1 year recommended.    AICD followed by electrophysiology.  Last seen 06/12/2019.  Per OV note pt stable at this visit with normal ICD function, no changes made.  Echo updated at this visit, stable.    Cardiac clearance requested.   Addendum 08/06/2019:  Py cleared by cardiology 08/06/2019.  Per [employer], PA-C, "Chart reviewed as part of pre-operative protocol coverage. The patient was most recently seen by Paula Booker 06/12/19. Had stable dyspnea. Echo with stable low LVEF. Given past medical history and time since last visit, based on ACC/AHA guidelines, Paula Booker would be at acceptable risk for the planned procedure without further cardiovascular testing."  Anticipate pt can proceed with planned procedure barring acute status change.   VS: BP 139/70 (BP Location: Right Arm)   Pulse 68   Temp 37 C (Oral)   Resp 18   Ht 5' 3.5" (1.613 m)   Wt 90.8 kg   SpO2 99%   BMI 34.91 kg/m   PROVIDERS: [name], MD is PCP   Larae Grooms, MD is Cardiologist  LABS: Labs reviewed: Acceptable for surgery. (all labs ordered are listed, but only abnormal results are displayed)  Labs Reviewed  BASIC METABOLIC PANEL - Abnormal; Notable for the following components:      Result Value   Glucose, Bld 157 (*)    All  other components within normal limits  CBC     IMAGES:   EKG: 06/12/2019 Rate 67 bpm  NSR  PVCs  CV: Echo 07/01/2019 IMPRESSIONS    1. Left ventricular ejection fraction, by estimation, is 25 to 30%. The  left ventricle has severely decreased function. The left ventricle  demonstrates global hypokinesis. The left ventricular internal cavity size  was mildly dilated. Left ventricular  diastolic parameters are consistent with Grade I diastolic dysfunction  (impaired relaxation).  2. Right ventricular systolic function is normal. The right ventricular  size is normal. There is mildly elevated pulmonary artery systolic  pressure.  3. The mitral valve is normal in structure. Trivial mitral valve  regurgitation. No evidence of mitral stenosis.  4. The aortic valve is normal in structure. Aortic valve regurgitation is  trivial. No aortic stenosis is present Past Medical History:  Diagnosis Date  . AICD (automatic cardioverter/defibrillator) present   . Anemia    as a child  . Anxiety   . Arthritis    "hands and knees" (03/22/2012)  . Asthma   . Basal cell carcinoma 04/27/2011   mid forehead([facility])  . Bursitis   . CHF (congestive heart failure) ([place]) 6/13   pt. denies  . Depression   . Dysrhythmia    WPW  . GERD (gastroesophageal reflux disease)   . Head injury, acute, with loss of consciousness ([place])   . History  of hiatal hernia   . Hypertension   . ICD (implantable cardiac defibrillator) in place Dec 2013  . Migraines    migraines   . NICM (nonischemic cardiomyopathy) ([place])   . Obesity (BMI 30-39.9)    negative sleep study 2014  . OSA (obstructive sleep apnea) 05/27/2018   Mild with AHI 6/hr    no cpap  . Pneumonia    hx  . Shortness of breath    "@ any time I can get SOB" (03/22/2012)  . Skin cancer 1999  . Sudden arrhythmia death syndrome   . WPW (Wolff-Parkinson-White syndrome)     Past Surgical History:  Procedure Laterality Date  . CARDIAC  CATHETERIZATION  09/18/11   no significant CAD  . CARDIAC DEFIBRILLATOR PLACEMENT  03/22/2012   DDD  . CHOLECYSTECTOMY  ~ 2003  . IMPLANTABLE CARDIOVERTER DEFIBRILLATOR IMPLANT N/A 03/22/2012   Procedure: IMPLANTABLE CARDIOVERTER DEFIBRILLATOR IMPLANT;  Surgeon: Evans Lance, MD;  [facility] LAB;  Service: Cardiovascular;  Laterality: N/A;  . KNEE ARTHROSCOPY Left 11/29/2017   Procedure: ARTHROSCOPY LEFT KNEE;  Surgeon: Dorna Leitz, MD;  Location[facility];  Service: Orthopedics;  Laterality: Left;  . KYPHOPLASTY N/A 11/27/2013   Procedure: KYPHOPLASTY;  Surgeon: Sinclair Ship, MD;  Location: [place];  Service: Orthopedics;  Laterality: N/A;  T9, T11 kyphoplasty  . KYPHOPLASTY N/A 06/05/2014   Procedure: KYPHOPLASTY;  Surgeon: Sinclair Ship, MD;  Location: [place];  Service: Orthopedics;  Laterality: N/A;  T7 kyphoplasty  . MOHS SURGERY  06/2011   "forehead" (03/22/2012)  . SKIN CANCER EXCISION  1999   "top of my head and my right back shoulder"  . SKIN CANCER EXCISION     back  . SKIN CANCER EXCISION     face  . SUPRAVENTRICULAR TACHYCARDIA ABLATION  03/22/2012   unable to induce VT/RN report 03/22/2012  . SUPRAVENTRICULAR TACHYCARDIA ABLATION N/A 03/22/2012   Procedure: SUPRAVENTRICULAR TACHYCARDIA ABLATION;  Surgeon: Evans Lance, MD;  Location: [facility] CATH LAB;  Service: Cardiovascular;  Laterality: N/A;  . TONSILLECTOMY AND ADENOIDECTOMY  1950  . TUBAL LIGATION  1978    MEDICATIONS: . acetaminophen (TYLENOL) 500 MG tablet  . albuterol (PROVENTIL HFA;VENTOLIN HFA) 108 (90 Base) MCG/ACT inhaler  . alendronate (FOSAMAX) 70 MG tablet  . BREO ELLIPTA 200-25 MCG/INH AEPB  . carvedilol (COREG) 12.5 MG tablet  . cetirizine (ZYRTEC) 10 MG tablet  . Cholecalciferol (VITAMIN D3) 5000 units CAPS  . citalopram (CELEXA) 20 MG tablet  . Cyanocobalamin (VITAMIN B-12) 5000 MCG LOZG  . fluticasone (FLONASE) 50 MCG/ACT nasal spray  . furosemide (LASIX) 40 MG tablet  .  ipratropium-albuterol (DUONEB) 0.5-2.5 (3) MG/3ML SOLN  . losartan (COZAAR) 25 MG tablet  . Multiple Vitamins-Minerals (CENTRUM SILVER 50+WOMEN) TABS  . pantoprazole (PROTONIX) 40 MG tablet  . spironolactone (ALDACTONE) 25 MG tablet   No current facility-administered medications for this encounter.    Konrad Felix, PA-C [facility] Pre-Surgical Testing [phone] 12:51 PM

## 2019-08-05 NOTE — Telephone Encounter (Signed)
   [facility] Pre-operative Risk Assessment    Request for surgical clearance:  1. What type of surgery is being performed? RIGHT KNEE ARTHROSCOPY   2. When is this surgery scheduled? 08/09/19   3. What type of clearance is required (medical clearance vs. Pharmacy clearance to hold med vs. Both)? MEDICAL  4. Are there any medications that need to be held prior to surgery and how long? NONE    5. Practice name and name of physician performing surgery? [facility]; DR. Jenny Reichmann [place]   6. What is your office phone number [phone]    7.   What is your office fax number [phone]  8.   Anesthesia type (None, local, MAC, general) ? CHOICE   Paula Booker 08/05/2019, 3:33 PM  _________________________________________________________________   (provider comments below)

## 2019-08-06 ENCOUNTER — Other Ambulatory Visit (HOSPITAL_COMMUNITY)
Admission: RE | Admit: 2019-08-06 | Discharge: 2019-08-06 | Disposition: A | Discharge status: Home / Self Care | Payer: Medicare HMO | Source: Ambulatory Visit | Attending: Orthopedic Surgery | Admitting: Orthopedic Surgery

## 2019-08-06 DIAGNOSIS — Z01812 Encounter for preprocedural laboratory examination: Secondary | ICD-10-CM | POA: Diagnosis not present

## 2019-08-06 DIAGNOSIS — Z20822 Contact with and (suspected) exposure to covid-19: Secondary | ICD-10-CM | POA: Diagnosis not present

## 2019-08-06 LAB — SARS CORONAVIRUS 2 (TAT 6-24 HRS): SARS Coronavirus 2: NEGATIVE

## 2019-08-06 NOTE — Telephone Encounter (Signed)
   Primary Cardiologist: Larae Grooms, MD  Chart reviewed as part of pre-operative protocol coverage. The patient was most recently seen by Joesph July 06/12/19. Had stable dyspnea. Echo with stable low LVEF. Given past medical history and time since last visit, based on ACC/AHA guidelines, Paula Booker would be at acceptable risk for the planned procedure without further cardiovascular testing.   I will route this recommendation to the requesting party via Epic fax function and remove from pre-op pool.  Please call with questions.  [place], [place] 08/06/2019, 9:29 AM

## 2019-08-08 ENCOUNTER — Encounter (HOSPITAL_COMMUNITY): Payer: Self-pay | Admitting: Orthopedic Surgery

## 2019-08-08 NOTE — Anesthesia Preprocedure Evaluation (Addendum)
Anesthesia Evaluation  Patient identified by MRN, date of birth, ID band Patient awake    Reviewed: Allergy & Precautions, NPO status , Patient's Chart, lab work & pertinent test results  Airway Mallampati: II  TM Distance: >3 FB Neck ROM: Full    Dental no notable dental hx. (+) Teeth Intact, Caps   Pulmonary shortness of breath, asthma , sleep apnea , pneumonia, resolved,    Pulmonary exam normal breath sounds clear to auscultation       Cardiovascular hypertension, Pt. on medications +CHF  Normal cardiovascular exam+ dysrhythmias Supra Ventricular Tachycardia + pacemaker + Cardiac Defibrillator  Rhythm:Regular Rate:Normal  Hx/o non ischemic CM    Neuro/Psych  Headaches, PSYCHIATRIC DISORDERS Anxiety Depression    GI/Hepatic hiatal hernia, GERD  Medicated and Controlled,  Endo/Other  Obesity  Renal/GU Renal diseaseHx/o AKI  negative genitourinary   Musculoskeletal  (+) Arthritis , Osteoarthritis,  Torn medial meniscus right knee   Abdominal (+) + obese,   Peds  Hematology  (+) anemia ,   Anesthesia Other Findings   Reproductive/Obstetrics                           Anesthesia Physical Anesthesia Plan  ASA: III  Anesthesia Plan: General   Post-op Pain Management:    Induction: Intravenous  PONV Risk Score and Plan: 4 or greater and Ondansetron, Dexamethasone and Treatment may vary due to age or medical condition  Airway Management Planned: LMA  Additional Equipment:   Intra-op Plan:   Post-operative Plan: Extubation in OR  Informed Consent: I have reviewed the patients History and Physical, chart, labs and discussed the procedure including the risks, benefits and alternatives for the proposed anesthesia with the patient or authorized representative who has indicated his/her understanding and acceptance.     Dental advisory given  Plan Discussed with: CRNA and  Surgeon  Anesthesia Plan Comments:        Anesthesia Quick Evaluation

## 2019-08-09 ENCOUNTER — Ambulatory Visit (HOSPITAL_COMMUNITY): Payer: Medicare HMO | Admitting: Anesthesiology

## 2019-08-09 ENCOUNTER — Ambulatory Visit (HOSPITAL_COMMUNITY): Payer: Medicare HMO | Admitting: Physician Assistant

## 2019-08-09 ENCOUNTER — Ambulatory Visit (HOSPITAL_COMMUNITY)
Admission: RE | Admit: 2019-08-09 | Discharge: 2019-08-09 | Disposition: A | Discharge status: Home / Self Care | Payer: Medicare HMO | Attending: Orthopedic Surgery | Admitting: Orthopedic Surgery

## 2019-08-09 ENCOUNTER — Encounter (HOSPITAL_COMMUNITY): Payer: Self-pay | Admitting: Orthopedic Surgery

## 2019-08-09 ENCOUNTER — Encounter (HOSPITAL_COMMUNITY)
Admission: RE | Disposition: A | Discharge status: Home / Self Care | Payer: Self-pay | Source: Home / Self Care | Attending: Orthopedic Surgery

## 2019-08-09 DIAGNOSIS — Z7951 Long term (current) use of inhaled steroids: Secondary | ICD-10-CM | POA: Insufficient documentation

## 2019-08-09 DIAGNOSIS — M2241 Chondromalacia patellae, right knee: Secondary | ICD-10-CM | POA: Diagnosis not present

## 2019-08-09 DIAGNOSIS — G4733 Obstructive sleep apnea (adult) (pediatric): Secondary | ICD-10-CM | POA: Diagnosis not present

## 2019-08-09 DIAGNOSIS — I428 Other cardiomyopathies: Secondary | ICD-10-CM | POA: Diagnosis not present

## 2019-08-09 DIAGNOSIS — F419 Anxiety disorder, unspecified: Secondary | ICD-10-CM | POA: Insufficient documentation

## 2019-08-09 DIAGNOSIS — K219 Gastro-esophageal reflux disease without esophagitis: Secondary | ICD-10-CM | POA: Insufficient documentation

## 2019-08-09 DIAGNOSIS — J45909 Unspecified asthma, uncomplicated: Secondary | ICD-10-CM | POA: Insufficient documentation

## 2019-08-09 DIAGNOSIS — S83241A Other tear of medial meniscus, current injury, right knee, initial encounter: Secondary | ICD-10-CM | POA: Insufficient documentation

## 2019-08-09 DIAGNOSIS — I5023 Acute on chronic systolic (congestive) heart failure: Secondary | ICD-10-CM | POA: Diagnosis not present

## 2019-08-09 DIAGNOSIS — M94261 Chondromalacia, right knee: Secondary | ICD-10-CM | POA: Diagnosis not present

## 2019-08-09 DIAGNOSIS — Z95810 Presence of automatic (implantable) cardiac defibrillator: Secondary | ICD-10-CM | POA: Insufficient documentation

## 2019-08-09 DIAGNOSIS — E669 Obesity, unspecified: Secondary | ICD-10-CM | POA: Insufficient documentation

## 2019-08-09 DIAGNOSIS — X58XXXA Exposure to other specified factors, initial encounter: Secondary | ICD-10-CM | POA: Diagnosis not present

## 2019-08-09 DIAGNOSIS — I471 Supraventricular tachycardia: Secondary | ICD-10-CM | POA: Diagnosis not present

## 2019-08-09 DIAGNOSIS — Z6834 Body mass index (BMI) 34.0-34.9, adult: Secondary | ICD-10-CM | POA: Diagnosis not present

## 2019-08-09 DIAGNOSIS — I10 Essential (primary) hypertension: Secondary | ICD-10-CM | POA: Insufficient documentation

## 2019-08-09 DIAGNOSIS — I456 Pre-excitation syndrome: Secondary | ICD-10-CM | POA: Diagnosis not present

## 2019-08-09 DIAGNOSIS — Z7983 Long term (current) use of bisphosphonates: Secondary | ICD-10-CM | POA: Insufficient documentation

## 2019-08-09 DIAGNOSIS — Z85828 Personal history of other malignant neoplasm of skin: Secondary | ICD-10-CM | POA: Diagnosis not present

## 2019-08-09 DIAGNOSIS — S83242D Other tear of medial meniscus, current injury, left knee, subsequent encounter: Secondary | ICD-10-CM | POA: Diagnosis not present

## 2019-08-09 DIAGNOSIS — F329 Major depressive disorder, single episode, unspecified: Secondary | ICD-10-CM | POA: Insufficient documentation

## 2019-08-09 DIAGNOSIS — I110 Hypertensive heart disease with heart failure: Secondary | ICD-10-CM | POA: Diagnosis not present

## 2019-08-09 HISTORY — PX: KNEE ARTHROSCOPY: SHX127

## 2019-08-09 SURGERY — ARTHROSCOPY, KNEE
Anesthesia: General | Site: Knee | Laterality: Right

## 2019-08-09 MED ORDER — OXYCODONE HCL 5 MG PO TABS: 5.0000 mg | ORAL_TABLET | Freq: Once | ORAL | Status: AC | PRN

## 2019-08-09 MED ORDER — FENTANYL CITRATE (PF) 100 MCG/2ML IJ SOLN
INTRAMUSCULAR | Status: AC
  Filled 2019-08-09: qty 2

## 2019-08-09 MED ORDER — FENTANYL CITRATE (PF) 100 MCG/2ML IJ SOLN
INTRAMUSCULAR | Status: DC | PRN
  Administered 2019-08-09: 25 ug via INTRAVENOUS
  Administered 2019-08-09: 50 ug via INTRAVENOUS
  Administered 2019-08-09: 160 ug via INTRAVENOUS

## 2019-08-09 MED ORDER — PHENYLEPHRINE HCL-NACL 10-0.9 MG/250ML-% IV SOLN
INTRAVENOUS | Status: DC | PRN
Start: 2019-08-09 — End: 2019-08-09
  Administered 2019-08-09: 50 ug/min via INTRAVENOUS

## 2019-08-09 MED ORDER — FENTANYL CITRATE (PF) 100 MCG/2ML IJ SOLN
25.0000 ug | INTRAMUSCULAR | Status: DC | PRN
  Administered 2019-08-09: 50 ug via INTRAVENOUS

## 2019-08-09 MED ORDER — MIDAZOLAM HCL 5 MG/5ML IJ SOLN
INTRAMUSCULAR | Status: DC | PRN
  Administered 2019-08-09: 1 mg via INTRAVENOUS

## 2019-08-09 MED ORDER — LIDOCAINE 2% (20 MG/ML) 5 ML SYRINGE
INTRAMUSCULAR | Status: AC
  Filled 2019-08-09: qty 5

## 2019-08-09 MED ORDER — MIDAZOLAM HCL 2 MG/2ML IJ SOLN
INTRAMUSCULAR | Status: AC
  Filled 2019-08-09: qty 2

## 2019-08-09 MED ORDER — BUPIVACAINE HCL (PF) 0.5 % IJ SOLN
INTRAMUSCULAR | Status: DC | PRN
  Administered 2019-08-09: 30 mL

## 2019-08-09 MED ORDER — LIDOCAINE HCL (CARDIAC) PF 100 MG/5ML IV SOSY
PREFILLED_SYRINGE | INTRAVENOUS | Status: DC | PRN
  Administered 2019-08-09: 80 mg via INTRAVENOUS

## 2019-08-09 MED ORDER — OXYCODONE HCL 5 MG PO TABS
ORAL_TABLET | ORAL | Status: AC
  Administered 2019-08-09: 5 mg via ORAL
  Filled 2019-08-09: qty 1

## 2019-08-09 MED ORDER — BUPIVACAINE HCL (PF) 0.5 % IJ SOLN
INTRAMUSCULAR | Status: AC
  Filled 2019-08-09: qty 30

## 2019-08-09 MED ORDER — DEXAMETHASONE SODIUM PHOSPHATE 10 MG/ML IJ SOLN
INTRAMUSCULAR | Status: DC | PRN
Start: 2019-08-09 — End: 2019-08-09
  Administered 2019-08-09: 8 mg via INTRAVENOUS

## 2019-08-09 MED ORDER — PHENYLEPHRINE HCL (PRESSORS) 10 MG/ML IV SOLN
INTRAVENOUS | Status: AC
  Filled 2019-08-09: qty 1

## 2019-08-09 MED ORDER — VANCOMYCIN HCL IN DEXTROSE 1-5 GM/200ML-% IV SOLN
1000.0000 mg | INTRAVENOUS | Status: AC
  Administered 2019-08-09: 1000 mg via INTRAVENOUS
  Filled 2019-08-09: qty 200

## 2019-08-09 MED ORDER — ONDANSETRON HCL 4 MG/2ML IJ SOLN
INTRAMUSCULAR | Status: AC
  Filled 2019-08-09: qty 2

## 2019-08-09 MED ORDER — MEPERIDINE HCL 50 MG/ML IJ SOLN: 6.2500 mg | INTRAMUSCULAR | Status: DC | PRN

## 2019-08-09 MED ORDER — SODIUM CHLORIDE 0.9 % IR SOLN
Status: DC | PRN
  Administered 2019-08-09: 3000 mL

## 2019-08-09 MED ORDER — PHENYLEPHRINE 40 MCG/ML (10ML) SYRINGE FOR IV PUSH (FOR BLOOD PRESSURE SUPPORT)
PREFILLED_SYRINGE | INTRAVENOUS | Status: AC
  Filled 2019-08-09: qty 10

## 2019-08-09 MED ORDER — EPINEPHRINE PF 1 MG/ML IJ SOLN
INTRAMUSCULAR | Status: DC | PRN
  Administered 2019-08-09: 1 mg

## 2019-08-09 MED ORDER — LACTATED RINGERS IV SOLN: INTRAVENOUS | Status: DC

## 2019-08-09 MED ORDER — EPINEPHRINE PF 1 MG/ML IJ SOLN
INTRAMUSCULAR | Status: AC
  Filled 2019-08-09: qty 1

## 2019-08-09 MED ORDER — ONDANSETRON HCL 4 MG/2ML IJ SOLN
INTRAMUSCULAR | Status: DC | PRN
  Administered 2019-08-09: 4 mg via INTRAVENOUS

## 2019-08-09 MED ORDER — PROPOFOL 10 MG/ML IV BOLUS
INTRAVENOUS | Status: AC
  Filled 2019-08-09: qty 20

## 2019-08-09 MED ORDER — PHENYLEPHRINE HCL (PRESSORS) 10 MG/ML IV SOLN
INTRAVENOUS | Status: DC | PRN
  Administered 2019-08-09: 120 ug via INTRAVENOUS

## 2019-08-09 MED ORDER — HYDROCODONE-ACETAMINOPHEN 5-325 MG PO TABS: 1.0000 | ORAL_TABLET | Freq: Four times a day (QID) | ORAL | 0 refills | Status: DC | PRN

## 2019-08-09 MED ORDER — DEXAMETHASONE SODIUM PHOSPHATE 10 MG/ML IJ SOLN
INTRAMUSCULAR | Status: AC
  Filled 2019-08-09: qty 1

## 2019-08-09 MED ORDER — OXYCODONE HCL 5 MG/5ML PO SOLN: 5.0000 mg | Freq: Once | ORAL | Status: AC | PRN

## 2019-08-09 MED ORDER — ONDANSETRON HCL 4 MG/2ML IJ SOLN: 4.0000 mg | Freq: Once | INTRAMUSCULAR | Status: DC | PRN

## 2019-08-09 SURGICAL SUPPLY — 30 items
BLADE EXCALIBUR 4.0X13 (MISCELLANEOUS) IMPLANT
BNDG ELASTIC 6X5.8 VLCR STR LF (GAUZE/BANDAGES/DRESSINGS) ×2 IMPLANT
BOOTIES KNEE HIGH SLOAN (MISCELLANEOUS) ×4 IMPLANT
COVER WAND RF STERILE (DRAPES) IMPLANT
DISSECTOR 4.0MM X 13CM (MISCELLANEOUS) IMPLANT
DRAPE U-SHAPE 47X51 STRL (DRAPES) ×2 IMPLANT
DRSG EMULSION OIL 3X3 NADH (GAUZE/BANDAGES/DRESSINGS) ×2 IMPLANT
DURAPREP 26ML APPLICATOR (WOUND CARE) ×2 IMPLANT
DW OUTFLOW CASSETTE/TUBE SET (MISCELLANEOUS) ×2 IMPLANT
FILTER STRAW (MISCELLANEOUS) ×2 IMPLANT
GAUZE SPONGE 4X4 12PLY STRL (GAUZE/BANDAGES/DRESSINGS) ×2 IMPLANT
GLOVE BIOGEL PI IND STRL 8 (GLOVE) ×2 IMPLANT
GLOVE BIOGEL PI INDICATOR 8 (GLOVE) ×2
GLOVE ECLIPSE 7.5 STRL STRAW (GLOVE) ×4 IMPLANT
GOWN STRL REUS W/TWL XL LVL3 (GOWN DISPOSABLE) ×4 IMPLANT
KIT BASIN (CUSTOM PROCEDURE TRAY) ×2 IMPLANT
KIT TURNOVER KIT A (KITS) ×2 IMPLANT
MANIFOLD NEPTUNE II (INSTRUMENTS) ×2 IMPLANT
NDL SAFETY ECLIPSE 18X1.5 (NEEDLE) ×1 IMPLANT
NEEDLE HYPO 18GX1.5 SHARP (NEEDLE) ×2
PACK ARTHROSCOPY WL (CUSTOM PROCEDURE TRAY) ×2 IMPLANT
PAD CAST 4YDX4 CTTN HI CHSV (CAST SUPPLIES) IMPLANT
PAD MASON LEG HOLDER (PIN) IMPLANT
PADDING CAST COTTON 4X4 STRL (CAST SUPPLIES) ×4
PADDING CAST COTTON 6X4 STRL (CAST SUPPLIES) ×2 IMPLANT
PENCIL SMOKE EVACUATOR (MISCELLANEOUS) IMPLANT
PORT APPOLLO RF 90DEGREE MULTI (SURGICAL WAND) IMPLANT
SUT ETHILON 4 0 PS 2 18 (SUTURE) IMPLANT
TUBING ARTHROSCOPY IRRIG 16FT (MISCELLANEOUS) ×2 IMPLANT
WRAP KNEE MAXI GEL POST OP (GAUZE/BANDAGES/DRESSINGS) ×2 IMPLANT

## 2019-08-09 NOTE — Anesthesia Procedure Notes (Signed)
Procedure Name: LMA Insertion Date/Time: 08/09/2019 7:29 AM Performed by: Glory Buff, CRNA Pre-anesthesia Checklist: Patient identified, Emergency Drugs available, Suction available and Patient being monitored Patient Re-evaluated:Patient Re-evaluated prior to induction Oxygen Delivery Method: Circle system utilized Preoxygenation: Pre-oxygenation with 100% oxygen Induction Type: IV induction LMA: LMA inserted LMA Size: 4.0 Number of attempts: 1 Placement Confirmation: positive ETCO2 and breath sounds checked- equal and bilateral Tube secured with: Tape Dental Injury: Teeth and Oropharynx as per pre-operative assessment

## 2019-08-09 NOTE — Transfer of Care (Signed)
Immediate Anesthesia Transfer of Care Note  Patient: Paula Booker  Procedure(s) Performed: ARTHROSCOPY RIGHT KNEE, CHONDROPLASTY,  PARTIAL MEDIAL  MENSICECTOMY (Right Knee)  Patient Location: PACU  Anesthesia Type:General  Level of Consciousness: drowsy, patient cooperative and responds to stimulation  Airway & Oxygen Therapy: Patient Spontanous Breathing and Patient connected to face mask oxygen  Post-op Assessment: Report given to RN  Post vital signs: Reviewed and stable  Last Vitals:  Vitals Value Taken Time  BP 147/75 08/09/19 0813  Temp    Pulse 75 08/09/19 0815  Resp 12 08/09/19 0815  SpO2 100 % 08/09/19 0815  Vitals shown include unvalidated device data.  Last Pain:  Vitals:   08/09/19 0625  TempSrc:   PainSc: 0-No pain         Complications: No apparent anesthesia complications

## 2019-08-09 NOTE — Anesthesia Postprocedure Evaluation (Signed)
Anesthesia Post Note  Patient: Paula Booker  Procedure(s) Performed: ARTHROSCOPY RIGHT KNEE, CHONDROPLASTY,  PARTIAL MEDIAL  MENSICECTOMY (Right Knee)     Patient location during evaluation: PACU Anesthesia Type: General Level of consciousness: awake and alert and oriented Pain management: pain level controlled Vital Signs Assessment: post-procedure vital signs reviewed and stable Respiratory status: spontaneous breathing, nonlabored ventilation and respiratory function stable Cardiovascular status: blood pressure returned to baseline and stable Postop Assessment: no apparent nausea or vomiting Anesthetic complications: no    Last Vitals:  Vitals:   08/09/19 0813 08/09/19 0900  BP: (!) 147/75   Pulse: 75   Resp: 15   Temp: 36.7 C 36.7 C  SpO2: 100%     Last Pain:  Vitals:   08/09/19 0900  TempSrc:   PainSc: 4                  Jerret Mcbane A.

## 2019-08-09 NOTE — Op Note (Signed)
NAME: Paula Booker, CERVIN MEDICAL RECORD [id number] ACCOUNT [id number] DATE OF BIRTH:[birth year] FACILITY: [facility] LOCATION: [facility] PHYSICIAN:Slade Pierpoint Maudie Mercury, MD  OPERATIVE REPORT  DATE OF PROCEDURE:  08/09/2019  PREOPERATIVE DIAGNOSES:   1.  Medial meniscal tear. 2.  Chondromalacia, medial, lateral and patellofemoral compartments.  POSTOPERATIVE DIAGNOSES: 1.  Medial meniscal tear. 2.  Chondromalacia, medial, lateral and patellofemoral compartments.  PRINCIPAL PROCEDURE: 1.  Right knee arthroscopy with partial medial meniscectomy. 2.  Chondroplasty medial, lateral, and patellofemoral.  SURGEON:  Dorna Leitz, MD  ASSISTANT:  Verdon Cummins PA-C, was present for the entire case and assisted by manipulation of the leg and closing to minimize OR time.  BRIEF HISTORY:  The patient is a 74 year old female with a long history of significant complaints of right knee pain.  She has been treated conservatively for a prolonged period of time.  CT scan was obtained, which showed that she had a posterior horn  medial meniscal tear as well as chondromalacia.  We talked about treatment options and ultimately felt that operative knee arthroscopy was appropriate.  She was brought to the operating room for this procedure.  DESCRIPTION OF PROCEDURE:  The patient brought to the operating room after adequate anesthesia obtained with a general anesthetic, the patient was placed supine on the operating table, right leg was prepped and draped in sterile fashion.  Following this,  routine arthroscopic examination of the knee reveals that there is significant chondromalacia of patellofemoral joint, which was debrided back to a smooth stable rim of articular cartilage.  Attention was turned to the medial compartment where after  addressing the medial plica to gain entrance, the medial femoral condyle was initially evaluated and noted to have significant chondromalacia with chondral flaps.  This was debrided back  to a smooth and stable rim of articular cartilage.  We went back to  the posterior horn of the medial meniscus.  Unfortunately, there was a deep radial tear at the posterior horn.  We debrided this initially and then you could really follow that area of tear all the way back to the rim.  We then debrided the posterior  horn to a smooth and stable rim of meniscus.  This probably did a 15% partial meniscectomy.  Once this was done, attention was turned to the ACL normal.  Attention was turned to the lateral side.  There was unfortunately some grade III change in the  lateral side as well.  This was debrided back to a smooth and stable rim of articular cartilage.  At this point, the knee was copiously and thoroughly lavaged.  We looked around again for any loose and fragmented pieces of cartilage.  Seeing none, a  final check was made medial and lateral and patellofemoral to see that we had debrided the chondromalacia well.  At this point, the knee was suctioned dry.  Marcaine 30 mL at 0.25% was instilled in the knee for postoperative anesthesia.  Sterile  compressive dressing was applied.  The patient taken to recovery, was noted to be in satisfactory condition.  Estimated blood loss for procedure was minimal.  CN/NUANCE  D:08/09/2019 T:08/09/2019 JOB:[id number]

## 2019-08-09 NOTE — Brief Op Note (Signed)
08/09/2019  8:18 AM  PATIENT:  Paula Booker  74 y.o. female  PRE-OPERATIVE DIAGNOSIS:  RIGHT KNEE MEDIAL MENISCUS TEAR  POST-OPERATIVE DIAGNOSIS:  RIGHT KNEE MEDIAL MENISCUS TEAR  PROCEDURE:  Procedure(s): ARTHROSCOPY RIGHT KNEE, CHONDROPLASTY,  PARTIAL MEDIAL  MENSICECTOMY (Right)  SURGEON:  Surgeon(s) and Role:    Dorna Leitz, MD - Primary  PHYSICIAN ASSISTANT:   ASSISTANTS: jim bethune   ANESTHESIA:   general  EBL:  10 mL   BLOOD ADMINISTERED:none  DRAINS: none   LOCAL MEDICATIONS USED:  MARCAINE     SPECIMEN:  No Specimen  DISPOSITION OF SPECIMEN:  N/A  COUNTS:  YES  TOURNIQUET:  * No tourniquets in log *  DICTATION: .Other Dictation: Dictation Number [id number]  PLAN OF CARE: Discharge to home after PACU  PATIENT DISPOSITION:  PACU - hemodynamically stable.   Delay start of Pharmacological VTE agent (>24hrs) due to surgical blood loss or risk of bleeding: no

## 2019-08-09 NOTE — H&P (Signed)
A pre op hand p   Chief Complaint: right knee pain  HPI: Paula Booker is a 74 y.o. female who presents for evaluation of right knee pain. It has been present for greater than 3 months and has been worsening. She has failed conservative measures. Pain is rated as moderate.  Past Medical History:  Diagnosis Date  . AICD (automatic cardioverter/defibrillator) present   . Anemia    as a child  . Anxiety   . Arthritis    "hands and knees" (03/22/2012)  . Asthma   . Basal cell carcinoma 04/27/2011   mid forehead([facility])  . Bursitis   . CHF (congestive heart failure) ([place]) 6/13   pt. denies  . Depression   . Dysrhythmia    WPW  . GERD (gastroesophageal reflux disease)   . Head injury, acute, with loss of consciousness ([place])   . History of hiatal hernia   . Hypertension   . ICD (implantable cardiac defibrillator) in place Dec 2013  . Migraines    migraines   . NICM (nonischemic cardiomyopathy) ([place])   . Obesity (BMI 30-39.9)    negative sleep study 2014  . OSA (obstructive sleep apnea) 05/27/2018   Mild with AHI 6/hr    no cpap  . Pneumonia    hx  . Shortness of breath    "@ any time I can get SOB" (03/22/2012)  . Skin cancer 1999  . Sudden arrhythmia death syndrome   . WPW (Wolff-Parkinson-White syndrome)    Past Surgical History:  Procedure Laterality Date  . CARDIAC CATHETERIZATION  09/18/11   no significant CAD  . CARDIAC DEFIBRILLATOR PLACEMENT  03/22/2012   DDD  . CHOLECYSTECTOMY  ~ 2003  . IMPLANTABLE CARDIOVERTER DEFIBRILLATOR IMPLANT N/A 03/22/2012   Procedure: IMPLANTABLE CARDIOVERTER DEFIBRILLATOR IMPLANT;  Surgeon: Evans Lance, MD;  Location: [facility] CATH LAB;  Service: Cardiovascular;  Laterality: N/A;  . KNEE ARTHROSCOPY Left 11/29/2017   Procedure: ARTHROSCOPY LEFT KNEE;  Surgeon: Dorna Leitz, MD;  Location: [facility] ORS;  Service: Orthopedics;  Laterality: Left;  . KYPHOPLASTY N/A 11/27/2013   Procedure: KYPHOPLASTY;  Surgeon: Sinclair Ship, MD;  Location:  [place];  Service: Orthopedics;  Laterality: N/A;  T9, T11 kyphoplasty  . KYPHOPLASTY N/A 06/05/2014   Procedure: KYPHOPLASTY;  Surgeon: Sinclair Ship, MD;  Location: [place];  Service: Orthopedics;  Laterality: N/A;  T7 kyphoplasty  . MOHS SURGERY  06/2011   "forehead" (03/22/2012)  . SKIN CANCER EXCISION  1999   "top of my head and my right back shoulder"  . SKIN CANCER EXCISION     back  . SKIN CANCER EXCISION     face  . SUPRAVENTRICULAR TACHYCARDIA ABLATION  03/22/2012   unable to induce VT/RN report 03/22/2012  . SUPRAVENTRICULAR TACHYCARDIA ABLATION N/A 03/22/2012   Procedure: SUPRAVENTRICULAR TACHYCARDIA ABLATION;  Surgeon: Evans Lance, MD;  Location: [facility] CATH LAB;  Service: Cardiovascular;  Laterality: N/A;  . TONSILLECTOMY AND ADENOIDECTOMY  1950  . TUBAL LIGATION  1978   Social History   Socioeconomic History  . Marital status: Divorced    Spouse name: Not on file  . Number of children: 3  . Years of education: Not on file  . Highest education level: Not on file  Occupational History  . Occupation: retired    Comment: Optometrist  Tobacco Use  . Smoking status: Never Smoker  . Smokeless tobacco: Never Used  Substance and Sexual Activity  . Alcohol use: Not Currently    Comment:  rare  . Drug use: No  . Sexual activity: Not Currently  Other Topics Concern  . Not on file  Social History Narrative   Divorced, 3 children, 9 grandchildren   Social Determinants of Health   Financial Resource Strain:   . Difficulty of Paying Living Expenses:   Food Insecurity:   . Worried About Charity fundraiser in the Last Year:   . Arboriculturist in the Last Year:   Transportation Needs:   . Film/video editor (Medical):   Marland Kitchen Lack of Transportation (Non-Medical):   Physical Activity:   . Days of Exercise per Week:   . Minutes of Exercise per Session:   Stress:   . Feeling of Stress :   Social Connections:   . Frequency of Communication with Friends and Family:    . Frequency of Social Gatherings with Friends and Family:   . Attends Religious Services:   . Active Member of Clubs or Organizations:   . Attends Archivist Meetings:   Marland Kitchen Marital Status:    Family History  Problem Relation Age of Onset  . Congestive Heart Failure Brother   . Atrial fibrillation Brother   . Congestive Heart Failure Mother        died 60  . Kidney Stones Mother   . Deafness Mother   . Atrial fibrillation Mother   . Congestive Heart Failure Father   . Deafness Father   . Healthy Sister   . Healthy Sister   . Healthy Sister   . Tuberculosis Paternal Grandfather    Allergies  Allergen Reactions  . Ivp Dye [Iodinated Diagnostic Agents] Hives and Other (See Comments)  . Penicillins Anaphylaxis    Has patient had a PCN reaction causing immediate rash, facial/tongue/throat swelling, SOB or lightheadedness with hypotension: Yes Has patient had a PCN reaction causing severe rash involving mucus membranes or skin necrosis: No Has patient had a PCN reaction that required hospitalization No Has patient had a PCN reaction occurring within the last 10 years: No If all of the above answers are "NO", then may proceed with Cephalosporin use.  . Sulfonamide Derivatives Other (See Comments)    Migraines  . Tessalon [Benzonatate] Hives and Rash    Severe rash  . Sulfa Antibiotics Other (See Comments)    Migraines  . Rosuvastatin Calcium Other (See Comments)    Reaction not recalled by the patient  . Onion Other (See Comments)    Raw onions cause migraines   Prior to Admission medications   Medication Sig Start Date End Date Taking? Authorizing Provider  acetaminophen (TYLENOL) 500 MG tablet Take 500 mg by mouth in the morning and at bedtime.   Yes [provider]  albuterol (PROVENTIL HFA;VENTOLIN HFA) 108 (90 Base) MCG/ACT inhaler Inhale 2 puffs into the lungs every 6 (six) hours as needed for wheezing. For wheezing. Use 2 puffs 3 times daily x5 days  and then every 6 hours as needed. Patient taking differently: Inhale 2 puffs into the lungs every 6 (six) hours as needed for wheezing.  01/18/18  Yes Mannam, Praveen, MD  alendronate (FOSAMAX) 70 MG tablet Take 70 mg by mouth every Sunday.    Yes [provider]  BREO ELLIPTA 200-25 MCG/INH AEPB INHALE 1 PUFF INTO THE LUNGS DAILY Patient taking differently: Inhale 1 puff into the lungs daily.  02/25/19  Yes Mannam, Praveen, MD  carvedilol (COREG) 12.5 MG tablet TAKE 1 TABLET BY MOUTH TWICE DAILY WITH A MEAL  Patient taking differently: Take 12.5 mg by mouth 2 (two) times daily with a meal.  07/19/19  Yes Evans Lance, MD  cetirizine (ZYRTEC) 10 MG tablet Take 10 mg by mouth daily.   Yes [provider]  Cholecalciferol (VITAMIN D3) 5000 units CAPS Take 5,000 Units by mouth daily.   Yes [provider]  citalopram (CELEXA) 20 MG tablet Take 20 mg by mouth daily.    Yes [provider]  Cyanocobalamin (VITAMIN B-12) 5000 MCG LOZG Take 5,000 mcg by mouth daily.   Yes [provider]  fluticasone (FLONASE) 50 MCG/ACT nasal spray Place 2 sprays into both nostrils daily.   Yes [provider]  furosemide (LASIX) 40 MG tablet TAKE 1 TO 2 TABLETS BY MOUTH AS DIRECTED ONCE DAILY FOR EDEMA, SHORTNESS OF BREATH, OR WEIGHT GAIN Patient taking differently: Take 80 mg by mouth daily. . 02/21/19  Yes Jettie Booze, MD  ipratropium-albuterol (DUONEB) 0.5-2.5 (3) MG/3ML SOLN Take 3 mLs by nebulization every 4 (four) hours as needed. Patient taking differently: Take 3 mLs by nebulization every 4 (four) hours as needed (for shortness of breath or wheezing).  10/09/17  Yes Lauraine Rinne, NP  losartan (COZAAR) 25 MG tablet Take 1 tablet by mouth twice daily Patient taking differently: Take 25 mg by mouth in the morning and at bedtime.  04/25/19  Yes Jettie Booze, MD  Multiple Vitamins-Minerals (CENTRUM SILVER 50+WOMEN) TABS Take 1 tablet by mouth daily  with breakfast.    Yes [provider]  pantoprazole (PROTONIX) 40 MG tablet Take 40 mg by mouth daily. 08/29/17  Yes [provider]  spironolactone (ALDACTONE) 25 MG tablet Take 1 tablet by mouth once daily Patient taking differently: Take 25 mg by mouth daily.  05/21/19  Yes Jettie Booze, MD     Positive ROS: none  All other systems have been reviewed and were otherwise negative with the exception of those mentioned in the HPI and as above.  Physical Exam: Vitals:   08/09/19 0544  BP: 131/60  Pulse: 73  Resp: 18  Temp: 98.1 F (36.7 C)  SpO2: 96%    General: Alert, no acute distress Cardiovascular: No pedal edema Respiratory: No cyanosis, no use of accessory musculature GI: No organomegaly, abdomen is soft and non-tender Skin: No lesions in the area of chief complaint Neurologic: Sensation intact distally Psychiatric: Patient is competent for consent with normal mood and affect Lymphatic: No axillary or cervical lymphadenopathy  MUSCULOSKELETAL: right knee: Painful range of motion.  Limited range of motion.  Tender to palpation over the medial joint line.  Positive McMurray.  No instability.  Trace effusion.  CT scan: Posterior horn medial meniscal tear with tricompartmental degenerative change  Assessment/Plan: RIGHT KNEE MEDIAL MENISCUS TEAR Plan for Procedure(s): ARTHROSCOPY RIGHT KNEE  The risks benefits and alternatives were discussed with the patient including but not limited to the risks of nonoperative treatment, versus surgical intervention including infection, bleeding, nerve injury, malunion, nonunion, hardware prominence, hardware failure, need for hardware removal, blood clots, cardiopulmonary complications, morbidity, mortality, among others, and they were willing to proceed.  Predicted outcome is good, although there will be at least a six to nine month expected recovery.  Alta Corning, MD 08/09/2019 6:43 AM

## 2019-08-09 NOTE — Discharge Instructions (Signed)
POST-OP KNEE ARTHROSCOPY INSTRUCTIONS  °Dr. John Graves/Jim Mae Cianci PA-C ° °Pain °You will be expected to have a moderate amount of pain in the affected knee for approximately two weeks. However, the first two days will be the most severe pain. A prescription has been provided to take as needed for the pain. The pain can be reduced by applying ice packs to the knee for the first 1-2 weeks post surgery. Also, keeping the leg elevated on pillows will help alleviate the pain. If you develop any acute pain or swelling in your calf muscle, please call the doctor. ° °Activity °It is preferred that you stay at bed rest for approximately 24 hours. However, you may go to the bathroom with help. Weight bearing as tolerated. You may begin the knee exercises the day of surgery. Discontinue crutches as the knee pain resolves. ° °Dressing °Keep the dressing dry. If the ace bandage should wrinkle or roll up, this can be rewrapped to prevent ridges in the bandage. You may remove all dressings in 48 hours,  apply bandaids to each wound. You may shower on the 4th day after surgery but no tub bath. ° °Symptoms to report to your doctor °Extreme pain °Extreme swelling °Temperature above 101 degrees °Change in the feeling, color, or movement of your toes °Redness, heat, or swelling at your incision ° °Exercise °If is preferred that as soon as possible you try to do a straight leg raise without bending the knee and concentrate on bringing the heel of your foot off the bed up to approximately 45 degrees and hold for the count of 10 seconds. Repeat this at least 10 times three or four times per day. Additional exercises are provided below. ° °You are encouraged to bend the knee as tolerated. ° °Follow-Up °Call to schedule a follow-up appointment in 5-7 days. Our office # is [phone]. ° °POST-OP EXERCISES ° °Short Arc Quads ° °1. Lie on back with legs straight. Place towel roll under thigh, just above knee. °2. Tighten thigh muscles to  straighten knee and lift heel off bed. °3. Hold for slow count of five, then lower. °4. Do three sets of ten ° ° ° °Straight Leg Raises ° °1. Lie on back with operative leg straight and other leg bent. °2. Keeping operative leg completely straight, slowly lift operative leg so foot is 5 inches off bed. °3. Hold for slow count of five, then lower. °4. Do three sets of ten. ° ° ° °DO BOTH EXERCISES 2 TIMES A DAY ° °Ankle Pumps ° °Work/move the operative ankle and foot up and down 10 times every hour while awake. °

## 2019-08-13 ENCOUNTER — Telehealth: Payer: Self-pay | Admitting: *Deleted

## 2019-08-13 ENCOUNTER — Ambulatory Visit (INDEPENDENT_AMBULATORY_CARE_PROVIDER_SITE_OTHER): Payer: Medicare HMO

## 2019-08-13 DIAGNOSIS — I5022 Chronic systolic (congestive) heart failure: Secondary | ICD-10-CM | POA: Diagnosis not present

## 2019-08-13 DIAGNOSIS — Z95810 Presence of automatic (implantable) cardiac defibrillator: Secondary | ICD-10-CM

## 2019-08-13 NOTE — Telephone Encounter (Signed)
ICM transmission from 08/13/19 showed a 6min duration episode of AF on 07/12/19. No history of AF noted per chart review. Not on [place]. Routed to Dr. Lovena Le for review and recommendations.

## 2019-08-13 NOTE — Progress Notes (Signed)
EPIC Encounter for ICM Monitoring  Patient Name: NATALIEE ROLSTAD is a 74 y.o. female Date: 08/13/2019 Primary Care Physican: [name], MD Primary [place] Electrophysiologist: Lovena Le 5/4/2021Weight:197 lbs  Since 09-Jul-2019  Time in AT/AF <0.1 hr/day (0.1%)  Longest AT/AF 58 minutes  58 minutes in AT/AF Since Last Session        Spoke with patient.  She denies any fluid symptoms.  Patient had knee arthroscopy on 08/06/2019.    Optivol thoracic impedancesuggesting possible fluid accumulation since 08/09/19.  Decreased impedance correlates with Knee Arthroscopy on 4/30.  Prescribed:Furosemide40 mgtake2 tablets(80 mg total)by mouth in the morning and an additional 40 mg once daily as needed for fluid retention.  Labs: 06/12/2019 Creatinine 0.81, BUN 18, Potassium 4.2, Sodium 140, GFR 72-83 04/08/2019 Creatinine 1.20, BUN 19, Potassium 4.1, Sodium 137, GFR 45-52 11/11/2018 Creatinine 0.83, BUN 18, Potassium 3.7, Sodium 137, GFR>60 05/04/2018 Creatinine 0.95, BUN 18, Potassium 3.7, Sodium 134, GFR 60  Recommendations:Advised to limit salt and fluid intake.  Encouraged to call for fluid symptoms.  Follow-up plan: ICM clinic phone appointment on5/18/2021 to recheck fluid levels. 91 day device clinic remote transmission6/29/2021.   Copy of ICM check sent to Dr.Taylor and Dr Irish Lack.  3 month ICM trend: 08/13/2019    1 Year ICM trend:       [name], RN 08/13/2019 8:57 AM

## 2019-08-13 NOTE — Telephone Encounter (Signed)
-----   Message from [name], RN sent at 08/13/2019  9:10 AM EDT ----- Regarding: AT/AF 58 minutes -  no OAC Good morning  [facility] report says 58 minutes AT/AF.  Patient of Dr Lovena Le.  Previous reports shows a few seconds of AT/AF.  She is not on anticoagulant. Can you review and check with Dr Lovena Le if needed?  Thanks Margarita Grizzle

## 2019-08-14 NOTE — Telephone Encounter (Signed)
If the patient only had a few seconds of atrial fib, then nothing else needs to be done. If an hour of atrial fib then I will need to see her back to discuss systemic anti-coagulation. GT

## 2019-08-15 NOTE — Telephone Encounter (Signed)
Spoke with patient to advise of transmission findings and Dr. Tanna Furry recommendation for an appointment. Pt accepted an appointment on 08/20/19 at 10:45am. Pt denies additional questions or concerns at this time.

## 2019-08-20 ENCOUNTER — Ambulatory Visit (INDEPENDENT_AMBULATORY_CARE_PROVIDER_SITE_OTHER): Payer: Medicare HMO | Admitting: Internal Medicine

## 2019-08-20 ENCOUNTER — Encounter: Payer: Self-pay | Admitting: Internal Medicine

## 2019-08-20 ENCOUNTER — Other Ambulatory Visit: Payer: Self-pay

## 2019-08-20 VITALS — BP 122/64 | HR 78 | Ht 63.5 in | Wt 197.8 lb

## 2019-08-20 DIAGNOSIS — I471 Supraventricular tachycardia: Secondary | ICD-10-CM

## 2019-08-20 DIAGNOSIS — I480 Paroxysmal atrial fibrillation: Secondary | ICD-10-CM | POA: Insufficient documentation

## 2019-08-20 DIAGNOSIS — I5022 Chronic systolic (congestive) heart failure: Secondary | ICD-10-CM | POA: Diagnosis not present

## 2019-08-20 DIAGNOSIS — Z95810 Presence of automatic (implantable) cardiac defibrillator: Secondary | ICD-10-CM

## 2019-08-20 MED ORDER — APIXABAN 5 MG PO TABS: 5.0000 mg | ORAL_TABLET | Freq: Two times a day (BID) | ORAL | 11 refills | Status: DC

## 2019-08-20 NOTE — Progress Notes (Signed)
HPI Paula Booker returns today for followup. She is a pleasant 74 yo woman with a h/o syncope, LV dysfunction, s/p ICD insertion. The patient has had knee surgery. She has not had any ICD shocks. She has had an episode of atrial fib lasting almost an hour. The patient denies peripheral edema.  Allergies  Allergen Reactions  . Ivp Dye [Iodinated Diagnostic Agents] Hives and Other (See Comments)  . Penicillins Anaphylaxis    Has patient had a PCN reaction causing immediate rash, facial/tongue/throat swelling, SOB or lightheadedness with hypotension: Yes Has patient had a PCN reaction causing severe rash involving mucus membranes or skin necrosis: No Has patient had a PCN reaction that required hospitalization No Has patient had a PCN reaction occurring within the last 10 years: No If all of the above answers are "NO", then may proceed with Cephalosporin use.  . Sulfonamide Derivatives Other (See Comments)    Migraines  . Tessalon [Benzonatate] Hives and Rash    Severe rash  . Sulfa Antibiotics Other (See Comments)    Migraines  . Rosuvastatin Calcium Other (See Comments)    Reaction not recalled by the patient  . Onion Other (See Comments)    Raw onions cause migraines     Current Outpatient Medications  Medication Sig Dispense Refill  . acetaminophen (TYLENOL) 500 MG tablet Take 500 mg by mouth in the morning and at bedtime.    Marland Kitchen albuterol (PROVENTIL HFA;VENTOLIN HFA) 108 (90 Base) MCG/ACT inhaler Inhale 2 puffs into the lungs every 6 (six) hours as needed for wheezing. For wheezing. Use 2 puffs 3 times daily x5 days and then every 6 hours as needed. 1 Inhaler 5  . alendronate (FOSAMAX) 70 MG tablet Take 70 mg by mouth every Sunday.     Marland Kitchen BREO ELLIPTA 200-25 MCG/INH AEPB INHALE 1 PUFF INTO THE LUNGS DAILY 60 each 0  . carvedilol (COREG) 12.5 MG tablet TAKE 1 TABLET BY MOUTH TWICE DAILY WITH A MEAL 180 tablet 3  . cetirizine (ZYRTEC) 10 MG tablet Take 10 mg by mouth daily.     . Cholecalciferol (VITAMIN D3) 5000 units CAPS Take 5,000 Units by mouth daily.    . citalopram (CELEXA) 20 MG tablet Take 20 mg by mouth daily.     . Cyanocobalamin (VITAMIN B-12) 5000 MCG LOZG Take 5,000 mcg by mouth daily.    . fluticasone (FLONASE) 50 MCG/ACT nasal spray Place 2 sprays into both nostrils daily.    . furosemide (LASIX) 40 MG tablet TAKE 1 TO 2 TABLETS BY MOUTH AS DIRECTED ONCE DAILY FOR EDEMA, SHORTNESS OF BREATH, OR WEIGHT GAIN 180 tablet 2  . HYDROcodone-acetaminophen (NORCO) 5-325 MG tablet Take 1-2 tablets by mouth every 6 (six) hours as needed for moderate pain. 30 tablet 0  . ipratropium-albuterol (DUONEB) 0.5-2.5 (3) MG/3ML SOLN Take 3 mLs by nebulization every 4 (four) hours as needed. 360 mL 1  . losartan (COZAAR) 25 MG tablet Take 1 tablet by mouth twice daily 180 tablet 3  . Multiple Vitamins-Minerals (CENTRUM SILVER 50+WOMEN) TABS Take 1 tablet by mouth daily with breakfast.     . pantoprazole (PROTONIX) 40 MG tablet Take 40 mg by mouth daily.  0  . spironolactone (ALDACTONE) 25 MG tablet Take 1 tablet by mouth once daily 90 tablet 3   No current facility-administered medications for this visit.     Past Medical History:  Diagnosis Date  . AICD (automatic cardioverter/defibrillator) present   . Anemia  as a child  . Anxiety   . Arthritis    "hands and knees" (03/22/2012)  . Asthma   . Basal cell carcinoma 04/27/2011   mid forehead([facility])  . Bursitis   . CHF (congestive heart failure) ([place]) 6/13   pt. denies  . Depression   . Dysrhythmia    WPW  . GERD (gastroesophageal reflux disease)   . Head injury, acute, with loss of consciousness ([place])   . History of hiatal hernia   . Hypertension   . ICD (implantable cardiac defibrillator) in place Dec 2013  . Migraines    migraines   . NICM (nonischemic cardiomyopathy) ([place])   . Obesity (BMI 30-39.9)    negative sleep study 2014  . OSA (obstructive sleep apnea) 05/27/2018   Mild with AHI 6/hr    no  cpap  . Pneumonia    hx  . Shortness of breath    "@ any time I can get SOB" (03/22/2012)  . Skin cancer 1999  . Sudden arrhythmia death syndrome   . WPW (Wolff-Parkinson-White syndrome)     ROS:   All systems reviewed and negative except as noted in the HPI.   Past Surgical History:  Procedure Laterality Date  . CARDIAC CATHETERIZATION  09/18/11   no significant CAD  . CARDIAC DEFIBRILLATOR PLACEMENT  03/22/2012   DDD  . CHOLECYSTECTOMY  ~ 2003  . IMPLANTABLE CARDIOVERTER DEFIBRILLATOR IMPLANT N/A 03/22/2012   Procedure: IMPLANTABLE CARDIOVERTER DEFIBRILLATOR IMPLANT;  Surgeon: Evans Lance, MD;  Location[facility] CATH LAB;  Service: Cardiovascular;  Laterality: N/A;  . KNEE ARTHROSCOPY Left 11/29/2017   Procedure: ARTHROSCOPY LEFT KNEE;  Surgeon: Dorna Leitz, MD;  Location: [facility] ORS;  Service: Orthopedics;  Laterality: Left;  . KNEE ARTHROSCOPY Right 08/09/2019   Procedure: ARTHROSCOPY RIGHT KNEE, CHONDROPLASTY,  PARTIAL MEDIAL  MENSICECTOMY;  Surgeon: Dorna Leitz, MD;  Location: [facility] ORS;  Service: Orthopedics;  Laterality: Right;  . KYPHOPLASTY N/A 11/27/2013   Procedure: KYPHOPLASTY;  Surgeon: Sinclair Ship, MD;  Location: [place];  Service: Orthopedics;  Laterality: N/A;  T9, T11 kyphoplasty  . KYPHOPLASTY N/A 06/05/2014   Procedure: KYPHOPLASTY;  Surgeon: Sinclair Ship, MD;  Location: [place];  Service: Orthopedics;  Laterality: N/A;  T7 kyphoplasty  . MOHS SURGERY  06/2011   "forehead" (03/22/2012)  . SKIN CANCER EXCISION  1999   "top of my head and my right back shoulder"  . SKIN CANCER EXCISION     back  . SKIN CANCER EXCISION     face  . SUPRAVENTRICULAR TACHYCARDIA ABLATION  03/22/2012   unable to induce VT/RN report 03/22/2012  . SUPRAVENTRICULAR TACHYCARDIA ABLATION N/A 03/22/2012   Procedure: SUPRAVENTRICULAR TACHYCARDIA ABLATION;  Surgeon: Evans Lance, MD;  Location[facility] CATH LAB;  Service: Cardiovascular;  Laterality: N/A;  . TONSILLECTOMY AND  ADENOIDECTOMY  1950  . TUBAL LIGATION  1978     Family History  Problem Relation Age of Onset  . Congestive Heart Failure Brother   . Atrial fibrillation Brother   . Congestive Heart Failure Mother        died 32  . Kidney Stones Mother   . Deafness Mother   . Atrial fibrillation Mother   . Congestive Heart Failure Father   . Deafness Father   . Healthy Sister   . Healthy Sister   . Healthy Sister   . Tuberculosis Paternal Grandfather      Social History   Socioeconomic History  . Marital status: Divorced  Spouse name: Not on file  . Number of children: 3  . Years of education: Not on file  . Highest education level: Not on file  Occupational History  . Occupation: retired    Comment: Optometrist  Tobacco Use  . Smoking status: Never Smoker  . Smokeless tobacco: Never Used  Substance and Sexual Activity  . Alcohol use: Not Currently    Comment: rare  . Drug use: No  . Sexual activity: Not Currently  Other Topics Concern  . Not on file  Social History Narrative   Divorced, 3 children, 9 grandchildren   Social Determinants of Health   Financial Resource Strain:   . Difficulty of Paying Living Expenses:   Food Insecurity:   . Worried About Charity fundraiser in the Last Year:   . Arboriculturist in the Last Year:   Transportation Needs:   . Film/video editor (Medical):   Marland Kitchen Lack of Transportation (Non-Medical):   Physical Activity:   . Days of Exercise per Week:   . Minutes of Exercise per Session:   Stress:   . Feeling of Stress :   Social Connections:   . Frequency of Communication with Friends and Family:   . Frequency of Social Gatherings with Friends and Family:   . Attends Religious Services:   . Active Member of Clubs or Organizations:   . Attends Archivist Meetings:   Marland Kitchen Marital Status:   Intimate Partner Violence:   . Fear of Current or Ex-Partner:   . Emotionally Abused:   Marland Kitchen Physically Abused:   . Sexually Abused:       BP 122/64   Pulse 78   Ht 5' 3.5" (1.613 m)   Wt 197 lb 12.8 oz (89.7 kg)   SpO2 96%   BMI 34.49 kg/m   Physical Exam:  Well appearing NAD HEENT: Unremarkable Neck:  No JVD, no thyromegally Lymphatics:  No adenopathy Back:  No CVA tenderness Lungs:  Clear with no wheezes HEART:  Regular rate rhythm, no murmurs, no rubs, no clicks Abd:  soft, positive bowel sounds, no organomegally, no rebound, no guarding Ext:  2 plus pulses, no edema, no cyanosis, no clubbing Skin:  No rashes no nodules Neuro:  CN II through XII intact, motor grossly intact  DEVICE  Normal device function.  See PaceArt for details.   Assess/Plan: 1. PAF - we discussed the indications for systemic anti-coagulation. She will be started on Eliquis 5 mg twice daily. 2. HTN - her bp is well controlled. We wil lfollow. 3. Chronic systolic heart failure -her symptoms are class 2. She will continue her guideline directed medical therapy. 4. ICD - her medtronic DDD ICD is working normally. We will recheck in several months.  Mikle Bosworth.D.

## 2019-08-20 NOTE — Patient Instructions (Addendum)
Medication Instructions:  Your physician has recommended you make the following change in your medication:  1.  Start taking Eliquis 5 mg-  Take one tablet by mouth twice a day  Labwork: None ordered.  Testing/Procedures: None ordered.  Follow-Up: Your physician wants you to follow-up in: December 2021 with Dr. Lovena Le.   You will receive a reminder letter in the mail two months in advance. If you don't receive a letter, please call our office to schedule the follow-up appointment.  Remote monitoring is used to monitor your ICD from home. This monitoring reduces the number of office visits required to check your device to one time per year. It allows [place] to keep an eye on the functioning of your device to ensure it is working properly. You are scheduled for a device check from home on 08/27/2019. You may send your transmission at any time that day. If you have a wireless device, the transmission will be sent automatically. After your physician reviews your transmission, you will receive a postcard with your next transmission date.  Any Other Special Instructions Will Be Listed Below (If Applicable).  If you need a refill on your cardiac medications before your next appointment, please call your pharmacy.   Apixaban oral tablets What is this medicine? APIXABAN (a PIX a ban) is an anticoagulant (blood thinner). It is used to lower the chance of stroke in people with a medical condition called atrial fibrillation. It is also used to treat or prevent blood clots in the lungs or in the veins. This medicine may be used for other purposes; ask your health care provider or pharmacist if you have questions. COMMON BRAND NAME(S): Eliquis What should I tell my health care provider before I take this medicine? They need to know if you have any of these conditions:  antiphospholipid antibody syndrome  bleeding disorders  bleeding in the brain  blood in your stools (black or tarry stools) or if you  have blood in your vomit  history of blood clots  history of stomach bleeding  kidney disease  liver disease  mechanical heart valve  an unusual or allergic reaction to apixaban, other medicines, foods, dyes, or preservatives  pregnant or trying to get pregnant  breast-feeding How should I use this medicine? Take this medicine by mouth with a glass of water. Follow the directions on the prescription label. You can take it with or without food. If it upsets your stomach, take it with food. Take your medicine at regular intervals. Do not take it more often than directed. Do not stop taking except on your doctor's advice. Stopping this medicine may increase your risk of a blood clot. Be sure to refill your prescription before you run out of medicine. Talk to your pediatrician regarding the use of this medicine in children. Special care may be needed. Overdosage: If you think you have taken too much of this medicine contact a poison control center or emergency room at once. NOTE: This medicine is only for you. Do not share this medicine with others. What if I miss a dose? If you miss a dose, take it as soon as you can. If it is almost time for your next dose, take only that dose. Do not take double or extra doses. What may interact with this medicine? This medicine may interact with the following:  aspirin and aspirin-like medicines  certain medicines for fungal infections like ketoconazole and itraconazole  certain medicines for seizures like carbamazepine and phenytoin  certain  medicines that treat or prevent blood clots like warfarin, enoxaparin, and dalteparin  clarithromycin  NSAIDs, medicines for pain and inflammation, like ibuprofen or naproxen  rifampin  ritonavir  St. John's wort This list may not describe all possible interactions. Give your health care provider a list of all the medicines, herbs, non-prescription drugs, or dietary supplements you use. Also tell them  if you smoke, drink alcohol, or use illegal drugs. Some items may interact with your medicine. What should I watch for while using this medicine? Visit your healthcare professional for regular checks on your progress. You may need blood work done while you are taking this medicine. Your condition will be monitored carefully while you are receiving this medicine. It is important not to miss any appointments. Avoid sports and activities that might cause injury while you are using this medicine. Severe falls or injuries can cause unseen bleeding. Be careful when using sharp tools or knives. Consider using an Copy. Take special care brushing or flossing your teeth. Report any injuries, bruising, or red spots on the skin to your healthcare professional. If you are going to need surgery or other procedure, tell your healthcare professional that you are taking this medicine. Wear a medical ID bracelet or chain. Carry a card that describes your disease and details of your medicine and dosage times. What side effects may I notice from receiving this medicine? Side effects that you should report to your doctor or health care professional as soon as possible:  allergic reactions like skin rash, itching or hives, swelling of the face, lips, or tongue  signs and symptoms of bleeding such as bloody or black, tarry stools; red or dark-brown urine; spitting up blood or brown material that looks like coffee grounds; red spots on the skin; unusual bruising or bleeding from the eye, gums, or nose  signs and symptoms of a blood clot such as chest pain; shortness of breath; pain, swelling, or warmth in the leg  signs and symptoms of a stroke such as changes in vision; confusion; trouble speaking or understanding; severe headaches; sudden numbness or weakness of the face, arm or leg; trouble walking; dizziness; loss of coordination This list may not describe all possible side effects. Call your doctor for  medical advice about side effects. You may report side effects to FDA at 1-800-FDA-1088. Where should I keep my medicine? Keep out of the reach of children. Store at room temperature between 20 and 25 degrees C (68 and 77 degrees F). Throw away any unused medicine after the expiration date. NOTE: This sheet is a summary. It may not cover all possible information. If you have questions about this medicine, talk to your doctor, pharmacist, or health care provider.  2020 Elsevier/Gold Standard (2017-12-06 17:39:34)

## 2019-08-22 DIAGNOSIS — M25561 Pain in right knee: Secondary | ICD-10-CM | POA: Diagnosis not present

## 2019-08-23 ENCOUNTER — Ambulatory Visit: Payer: Medicare HMO | Admitting: Physical Therapy

## 2019-08-26 ENCOUNTER — Encounter: Payer: Self-pay | Admitting: Physical Therapy

## 2019-08-26 ENCOUNTER — Ambulatory Visit: Payer: Medicare HMO | Attending: Orthopedic Surgery | Admitting: Physical Therapy

## 2019-08-26 ENCOUNTER — Other Ambulatory Visit: Payer: Self-pay

## 2019-08-26 DIAGNOSIS — M25561 Pain in right knee: Secondary | ICD-10-CM | POA: Diagnosis not present

## 2019-08-26 DIAGNOSIS — R2689 Other abnormalities of gait and mobility: Secondary | ICD-10-CM | POA: Diagnosis not present

## 2019-08-26 DIAGNOSIS — M25661 Stiffness of right knee, not elsewhere classified: Secondary | ICD-10-CM | POA: Insufficient documentation

## 2019-08-26 DIAGNOSIS — G8929 Other chronic pain: Secondary | ICD-10-CM | POA: Insufficient documentation

## 2019-08-26 NOTE — Therapy (Signed)
[place], [place], [address] Phone: [phone]   Fax:  [phone]  Physical Therapy Evaluation  Patient Details  Name: DEYSI BERGEN MRN: [id number] Date of Birth: September 26, 1945 Referring Provider (PT): Dorna Leitz MD   Encounter Date: 08/26/2019  PT End of Session - 08/26/19 1518    Visit Number  1    Number of Visits  13    Date for PT Re-Evaluation  10/07/19    Authorization Type  MCR: kx mod at 15th visit    Progress Note Due on Visit  10    PT Start Time  1501    PT Stop Time  1545    PT Time Calculation (min)  44 min    Activity Tolerance  Patient tolerated treatment well    Behavior During Therapy  [facility] for tasks assessed/performed       Past Medical History:  Diagnosis Date  . AICD (automatic cardioverter/defibrillator) present   . Anemia    as a child  . Anxiety   . Arthritis    "hands and knees" (03/22/2012)  . Asthma   . Basal cell carcinoma 04/27/2011   mid forehead(MOHS)  . Bursitis   . CHF (congestive heart failure) ([place]) 6/13   pt. denies  . Depression   . Dysrhythmia    WPW  . GERD (gastroesophageal reflux disease)   . Head injury, acute, with loss of consciousness ([place])   . History of hiatal hernia   . Hypertension   . ICD (implantable cardiac defibrillator) in place Dec 2013  . Migraines    migraines   . NICM (nonischemic cardiomyopathy) ([place])   . Obesity (BMI 30-39.9)    negative sleep study 2014  . OSA (obstructive sleep apnea) 05/27/2018   Mild with AHI 6/hr    no cpap  . Pneumonia    hx  . Shortness of breath    "@ any time I can get SOB" (03/22/2012)  . Skin cancer 1999  . Sudden arrhythmia death syndrome   . WPW (Wolff-Parkinson-White syndrome)     Past Surgical History:  Procedure Laterality Date  . CARDIAC CATHETERIZATION  09/18/11   no significant CAD  . CARDIAC DEFIBRILLATOR PLACEMENT  03/22/2012   DDD  . CHOLECYSTECTOMY  ~ 2003  . IMPLANTABLE  CARDIOVERTER DEFIBRILLATOR IMPLANT N/A 03/22/2012   Procedure: IMPLANTABLE CARDIOVERTER DEFIBRILLATOR IMPLANT;  Surgeon: Evans Lance, MD;  Location[facility] C[facility] LAB;  Service: Cardiovascular;  Laterality: N/A;  . KNEE ARTHROSCOPY Left 11/29/2017   Procedure: ARTHROSCOPY LEFT KNEE;  Surgeon: Dorna Leitz, MD;  Location: [facility] ORS;  Service: Orthopedics;  Laterality: Left;  . KNEE ARTHROSCOPY Right 08/09/2019   Procedure: ARTHROSCOPY RIGHT KNEE, CHONDROPLASTY,  PARTIAL MEDIAL  MENSICECTOMY;  Surgeon: Dorna Leitz, MD;  Location: [facility];  Service: Orthopedics;  Laterality: Right;  . KYPHOPLASTY N/A 11/27/2013   Procedure: KYPHOPLASTY;  Surgeon: Sinclair Ship, MD;  Location: [place];  Service: Orthopedics;  Laterality: N/A;  T9, T11 kyphoplasty  . KYPHOPLASTY N/A 06/05/2014   Procedure: KYPHOPLASTY;  Surgeon: Sinclair Ship, MD;  Location: [place];  Service: Orthopedics;  Laterality: N/A;  T7 kyphoplasty  . MOHS SURGERY  06/2011   "forehead" (03/22/2012)  . SKIN CANCER EXCISION  1999   "top of my head and my right back shoulder"  . SKIN CANCER EXCISION     back  . SKIN CANCER EXCISION     face  . SUPRAVENTRICULAR TACHYCARDIA ABLATION  03/22/2012  unable to induce VT/RN report 03/22/2012  . SUPRAVENTRICULAR TACHYCARDIA ABLATION N/A 03/22/2012   Procedure: SUPRAVENTRICULAR TACHYCARDIA ABLATION;  Surgeon: Evans Lance, MD;  Location: [facility] CATH LAB;  Service: Cardiovascular;  Laterality: N/A;  . TONSILLECTOMY AND ADENOIDECTOMY  1950  . TUBAL LIGATION  1978    There were no vitals filed for this visit.   Subjective Assessment - 08/26/19 1509    Subjective  pt is a 74 y.o F s/p R knee menisectomy on 08/09/2019. Since the surgery she reports the pain is getting better expecially after drawing the fluid off of the knee last thursday. Pain stays mostly in the knee in the back of knee, and denies any N/T, buckling    Limitations  Standing;Walking;Lifting    How long can you sit comfortably?   depends on chair surface    How long can you stand comfortably?  10 min    How long can you walk comfortably?  10 min    Diagnostic tests  07/12/2019 IMPRESSION:1. No acute osseous injury of the right knee.2. Moderate joint effusion.3. Mild tricompartmental osteoarthritis of the right knee.4. Suggestion of a partial radial tear of the posterior horn of themedial meniscus but evaluation is limited on CT.    Patient Stated Goals  to decrease pain    Currently in Pain?  Yes    Pain Score  5    last took tylenol at 9am   Pain Location  Knee    Pain Orientation  Right;Posterior    Pain Descriptors / Indicators  Aching;Dull    Pain Type  Surgical pain    Pain Onset  More than a month ago    Pain Frequency  Constant    Aggravating Factors   stnding/ walking, squating, staris    Pain Relieving Factors  sitting down, tylenol, ice    Effect of Pain on Daily Activities  limited standing/ walking endurance, squating         [facility] PT Assessment - 08/26/19 1520      Assessment   Medical Diagnosis  s/p R knee athroscopy / meniscal debridement    Referring Provider (PT)  Dorna Leitz MD    Onset Date/Surgical Date  08/09/19    Hand Dominance  Right    Next MD Visit  09/12/2019    Prior Therapy  yes   for L knee     Precautions   Precautions  None      Restrictions   Weight Bearing Restrictions  No      Balance Screen   Has the patient fallen in the past 6 months  No    Has the patient had a decrease in activity level because of a fear of falling?   No    Is the patient reluctant to leave their home because of a fear of falling?   No      Home Environment   Living Environment  Private residence    Living Arrangements  Alone    Available Help at Discharge  Family    Type of [place] entrance    [place]  Two level    Alternate Level Stairs-Number of Steps  12    Alternate Level Stairs-Rails  Right   ascending   [place] - 2 wheels;Crutches       Prior Function   Level of Independence  Independent with basic ADLs    Vocation  Retired  Cognition   Overall Cognitive Status  Within Functional Limits for tasks assessed      Observation/Other Assessments   Focus on Therapeutic Outcomes (FOTO)   51% limited   predicted 38% limited     Posture/Postural Control   Posture/Postural Control  Postural limitations    Postural Limitations  Rounded Shoulders;Forward head      ROM / Strength   AROM / PROM / Strength  AROM;Strength;PROM      AROM   AROM Assessment Site  Knee    Right/Left Knee  Right;Left    Right Knee Extension  12    Right Knee Flexion  120    Left Knee Extension  1    Left Knee Flexion  134      PROM   PROM Assessment Site  Knee    Right/Left Knee  Right    Right Knee Extension  10    Right Knee Flexion  123      Strength   Strength Assessment Site  Knee;Hip    Right/Left Hip  Right;Left    Right Hip Flexion  4/5    Right Hip Extension  4-/5    Right Hip ABduction  4-/5    Right Hip ADduction  4/5    Left Hip Flexion  4/5    Left Hip Extension  4-/5    Left Hip ABduction  4-/5    Left Hip ADduction  4/5    Right/Left Knee  Right;Left    Right Knee Flexion  4/5    Right Knee Extension  4/5    Left Knee Flexion  4+/5    Left Knee Extension  4+/5      Palpation   Palpation comment  TTP peri-patellar and along the distal aspect of the semi-membranosus      Ambulation/Gait   Ambulation/Gait  Yes    Gait Pattern  Decreased stride length;Decreased stance time - right;Decreased step length - left;Antalgic;Trunk flexed;Step-through pattern                  Objective measurements completed on examination: See above findings.              PT Education - 08/26/19 1519    Education Details  evaluation findings, POC, goals, HEp with proper form/ rationale, anatomy of the area involved.    Person(s) Educated  Patient    Methods  Explanation;Verbal cues;Handout    Comprehension   Verbalized understanding;Verbal cues required       PT Short Term Goals - 08/26/19 1604      PT SHORT TERM GOAL #1   Title  She will be independent with initial HEP    Period  Weeks    Status  New    Target Date  09/16/19      PT SHORT TERM GOAL #2   Title  -    Period  Weeks      PT SHORT TERM GOAL #3   Title  -        PT Long Term Goals - 08/26/19 1605      PT LONG TERM GOAL #1   Title  increase knee extension to </= 3 degrees for function knee ROM required for funcitonal and efficient gait    Time  6    Period  Weeks    Status  New    Target Date  10/07/19      PT LONG TERM GOAL #2   Title  increase gross RLE  strength to >/= 4+/5 for hip/knee stability    Time  6    Period  Weeks    Status  New    Target Date  10/07/19      PT LONG TERM GOAL #3   Title  pt to be able walk / stand for >/= 45 min and navigate up/down >/= 12 steps for functional endurance required for ADLs    Baseline  -    Time  6    Period  Weeks    Status  New    Target Date  10/07/19      PT LONG TERM GOAL #4   Title  increaes FOTO score to </= 38% limited to demo improvement in function    Time  6    Period  Weeks    Status  New    Target Date  10/07/19      PT LONG TERM GOAL #5   Title  pt to be I with all HEP given to maintain and progress current level of function    Time  6    Period  Weeks    Status  New    Target Date  10/07/19             Plan - 08/26/19 1519    Clinical Impression Statement  pt presents to OPP[facility] s/p R knee arthroscopic meniscal debridement on 08/09/2019. She has function knee flexion but lacks 12 degrees of terminal knee extension, and associated weakness in the R knee. TTP peri-patellar and along the semi-membranosus. She would benefit from physical therapy to decrease R knee pain, improve knee extension, increase knee strength, promote efficient gait biomechanics and return to PLOF by addressing the deficits listed.    Personal Factors and  Comorbidities  Comorbidity 3+    Comorbidities  hx of depression, anemia, pacemaker    Stability/Clinical Decision Making  Evolving/Moderate complexity    Clinical Decision Making  Moderate    Rehab Potential  Good    PT Frequency  2x / week    PT Duration  6 weeks    PT Treatment/Interventions  ADLs/Self Care Home Management;Cryotherapy;Moist Heat;Therapeutic activities;Therapeutic exercise;Balance training;Neuromuscular re-education;Manual techniques;Passive range of motion;Dry needling;Patient/family education;Vasopneumatic Device;Taping    PT Next Visit Plan  (no e-stim or [place] pacemaker), review/ update HEP, knee extension ROM, gait biomechanics, hip/ knee strengthening, ice    PT Home Exercise Plan  [id number] - hamstring stretch seated and supine, SLR, lateral weight shifting,    Consulted and Agree with Plan of Care  Patient       Patient will benefit from skilled therapeutic intervention in order to improve the following deficits and impairments:  Improper body mechanics, Increased muscle spasms, Pain, Postural dysfunction, Decreased strength, Decreased activity tolerance, Decreased endurance, Decreased balance, Decreased range of motion, Abnormal gait  Visit Diagnosis: Chronic pain of right knee  Other abnormalities of gait and mobility  Stiffness of right knee, not elsewhere classified     Problem List Patient Active Problem List   Diagnosis Date Noted  . Paroxysmal atrial fibrillation ([place]) 08/20/2019  . Acute medial meniscus tear of right knee 08/09/2019  . Chondromalacia, right knee 08/09/2019  . SVT (supraventricular tachycardia) ([place]) 11/02/2018  . ICD (implantable cardioverter-defibrillator) in place 11/02/2018  . OSA (obstructive sleep apnea) 05/27/2018  . Acute medial meniscus tear, left, initial encounter 11/29/2017  . Chondromalacia of left knee 11/29/2017  . Migraines 09/23/2017  . Anxiety 09/23/2017  . Depression 09/23/2017  . Pacemaker 09/23/2017  .  Chronic systolic heart failure ([place]) 04/28/2015  . UTI (urinary tract infection) 03/22/2015  . Sepsis ([place]) 03/19/2015  . Acute kidney injury ([place]) 03/19/2015  . Diarrhea 03/18/2015  . CAP (community acquired pneumonia) 03/18/2015  . Nausea vomiting and diarrhea 03/18/2015  . Compression fracture 06/05/2014  . Essential hypertension 03/27/2014  . Chest pain 01/27/2014  . Normal coronary arteries 01/27/2014  . Obesity (BMI 30-39.9)   . ICD- MDT implanted Dec 2013 03/26/2013  . NICM (nonischemic cardiomyopathy) ([place]) 02/21/2013  . GERD (gastroesophageal reflux disease) 04/23/2012  . Asthma in adult, mild persistent, uncomplicated [id number]  . Shortness of breath 04/23/2012  . Syncope 03/01/2012  . Acute on chronic systolic CHF (congestive heart failure) ([place]) 03/01/2012  . Type B WPW syndrome- RFA Dec 2013 03/01/2012   Starr Lake PT, DPT, LAT, ATC  08/26/19  4:18 PM      [facility] [address] [place], [place], [address] Phone: [phone]   Fax:  [phone]  Name: JULANN PRESSMAN MRN: [id number] Date of Birth: 11/28/1945

## 2019-08-27 ENCOUNTER — Ambulatory Visit (INDEPENDENT_AMBULATORY_CARE_PROVIDER_SITE_OTHER): Payer: Medicare HMO

## 2019-08-27 DIAGNOSIS — I5022 Chronic systolic (congestive) heart failure: Secondary | ICD-10-CM

## 2019-08-27 DIAGNOSIS — Z95810 Presence of automatic (implantable) cardiac defibrillator: Secondary | ICD-10-CM

## 2019-08-28 NOTE — Progress Notes (Signed)
EPIC Encounter for ICM Monitoring  Patient Name: Paula Booker is a 74 y.o. female Date: 08/28/2019 Primary Care Physican: [name], MD Primary [place] Electrophysiologist: Lovena Le 5/4/2021Weight:197 lbs  Time in AT/AF 0.0 hr/day (0.0%)        Spoke with patient and reports feeling well at this time.  Denies fluid symptoms.    Optivol thoracic impedancereturned to normal since 5/4/remote transmission  Prescribed:Furosemide40 mgtake2 tablets(80 mg total)by mouth in the morning and an additional 40 mg once daily as needed for fluid retention.  Labs: 07/31/2019 Creatinine 0.84, BUN 18 Potassium 4.4, Sodium 140, GFR >60 06/12/2019 Creatinine 0.81, BUN 18, Potassium 4.2, Sodium 140, GFR 72-83 A complete set of results can be found in Results Review.  Recommendations: No changes and encouraged to call if experiencing any fluid symptoms.  Follow-up plan: ICM clinic phone appointment on6/10/2019. 91 day device clinic remote transmission6/29/2021.   Copy of ICM check sent to Dr.Taylor.  3 month ICM trend: 08/27/2019    1 Year ICM trend:       [name], RN 08/28/2019 12:59 PM

## 2019-08-29 ENCOUNTER — Ambulatory Visit: Payer: Medicare HMO | Admitting: Physical Therapy

## 2019-08-29 ENCOUNTER — Encounter: Payer: Self-pay | Admitting: Physical Therapy

## 2019-08-29 ENCOUNTER — Other Ambulatory Visit: Payer: Self-pay

## 2019-08-29 DIAGNOSIS — R2689 Other abnormalities of gait and mobility: Secondary | ICD-10-CM

## 2019-08-29 DIAGNOSIS — G8929 Other chronic pain: Secondary | ICD-10-CM

## 2019-08-29 DIAGNOSIS — M25561 Pain in right knee: Secondary | ICD-10-CM | POA: Diagnosis not present

## 2019-08-29 DIAGNOSIS — M25661 Stiffness of right knee, not elsewhere classified: Secondary | ICD-10-CM | POA: Diagnosis not present

## 2019-08-29 NOTE — Therapy (Signed)
[place], [place], [address] Phone: [phone]   Fax:  [phone]  Physical Therapy Treatment  Patient Details  Name: Paula Booker MRN: [id number] Date of Birth: Aug 14, 1945 Referring Provider (PT): Dorna Leitz MD   Encounter Date: 08/29/2019  PT End of Session - 08/29/19 1549    Visit Number  2    Number of Visits  13    Date for PT Re-Evaluation  10/07/19    Authorization Type  MCR: kx mod at 15th visit    PT Start Time  1547    PT Stop Time  1636    PT Time Calculation (min)  49 min    Activity Tolerance  Patient tolerated treatment well    Behavior During Therapy  [facility] for tasks assessed/performed       Past Medical History:  Diagnosis Date  . AICD (automatic cardioverter/defibrillator) present   . Anemia    as a child  . Anxiety   . Arthritis    "hands and knees" (03/22/2012)  . Asthma   . Basal cell carcinoma 04/27/2011   mid forehead(MOHS)  . Bursitis   . CHF (congestive heart failure) ([place]) 6/13   pt. denies  . Depression   . Dysrhythmia    WPW  . GERD (gastroesophageal reflux disease)   . Head injury, acute, with loss of consciousness ([place])   . History of hiatal hernia   . Hypertension   . ICD (implantable cardiac defibrillator) in place Dec 2013  . Migraines    migraines   . NICM (nonischemic cardiomyopathy) ([place])   . Obesity (BMI 30-39.9)    negative sleep study 2014  . OSA (obstructive sleep apnea) 05/27/2018   Mild with AHI 6/hr    no cpap  . Pneumonia    hx  . Shortness of breath    "@ any time I can get SOB" (03/22/2012)  . Skin cancer 1999  . Sudden arrhythmia death syndrome   . WPW (Wolff-Parkinson-White syndrome)     Past Surgical History:  Procedure Laterality Date  . CARDIAC CATHETERIZATION  09/18/11   no significant CAD  . CARDIAC DEFIBRILLATOR PLACEMENT  03/22/2012   DDD  . CHOLECYSTECTOMY  ~ 2003  . IMPLANTABLE CARDIOVERTER DEFIBRILLATOR IMPLANT  N/A 03/22/2012   Procedure: IMPLANTABLE CARDIOVERTER DEFIBRILLATOR IMPLANT;  Surgeon: Evans Lance, MD;  Location[facility] CATH LAB;  Service: Cardiovascular;  Laterality: N/A;  . KNEE ARTHROSCOPY Left 11/29/2017   Procedure: ARTHROSCOPY LEFT KNEE;  Surgeon: Dorna Leitz, MD;  [facility];  Service: Orthopedics;  Laterality: Left;  . KNEE ARTHROSCOPY Right 08/09/2019   Procedure: ARTHROSCOPY RIGHT KNEE, CHONDROPLASTY,  PARTIAL MEDIAL  MENSICECTOMY;  Surgeon: Dorna Leitz, MD;  [facility];  Service: Orthopedics;  Laterality: Right;  . KYPHOPLASTY N/A 11/27/2013   Procedure: KYPHOPLASTY;  Surgeon: Sinclair Ship, MD;  Location: [place];  Service: Orthopedics;  Laterality: N/A;  T9, T11 kyphoplasty  . KYPHOPLASTY N/A 06/05/2014   Procedure: KYPHOPLASTY;  Surgeon: Sinclair Ship, MD;  Location: [place];  Service: Orthopedics;  Laterality: N/A;  T7 kyphoplasty  . MOHS SURGERY  06/2011   "forehead" (03/22/2012)  . SKIN CANCER EXCISION  1999   "top of my head and my right back shoulder"  . SKIN CANCER EXCISION     back  . SKIN CANCER EXCISION     face  . SUPRAVENTRICULAR TACHYCARDIA ABLATION  03/22/2012   unable to induce VT/RN report 03/22/2012  .  SUPRAVENTRICULAR TACHYCARDIA ABLATION N/A 03/22/2012   Procedure: SUPRAVENTRICULAR TACHYCARDIA ABLATION;  Surgeon: Evans Lance, MD;  Location: [facility] CATH LAB;  Service: Cardiovascular;  Laterality: N/A;  . TONSILLECTOMY AND ADENOIDECTOMY  1950  . TUBAL LIGATION  1978    There were no vitals filed for this visit.  Subjective Assessment - 08/29/19 1550    Subjective  " I have stayed consistent with my exercises and it has really helped alot"    Diagnostic tests  07/12/2019 IMPRESSION:1. No acute osseous injury of the right knee.2. Moderate joint effusion.3. Mild tricompartmental osteoarthritis of the right knee.4. Suggestion of a partial radial tear of the posterior horn of themedial meniscus but evaluation is limited on CT.    Patient  Stated Goals  to decrease pain    Currently in Pain?  Yes    Pain Location  Knee    Pain Orientation  Right    Pain Descriptors / Indicators  Aching    Pain Type  Chronic pain    Pain Onset  More than a month ago    Pain Frequency  Intermittent         [facility] PT Assessment - 08/29/19 0001      Assessment   Medical Diagnosis  s/p R knee athroscopy / meniscal debridement    Referring Provider (PT)  Dorna Leitz MD      AROM   Right Knee Extension  10                    [facility] Adult PT Treatment/Exercise - 08/29/19 0001      Exercises   Exercises  Knee/Hip      Knee/Hip Exercises: Stretches   Passive Hamstring Stretch  3 reps;30 seconds;Right      Knee/Hip Exercises: Aerobic   Recumbent Bike  L1 x 5 min      Knee/Hip Exercises: Standing   Gait Training  --    Other Standing Knee Exercises  rocking forward with RLE advanced froward and DF with posterior rocking 1 x 12      Knee/Hip Exercises: Seated   Long Arc Quad  2 sets;15 reps;Both;Strengthening;Weights    Long Arc Quad Weight  3 lbs.    Marching  2 sets;15 reps    Marching Weights  3 lbs.      Knee/Hip Exercises: Supine   Straight Leg Raises  2 sets;10 reps;Strengthening;Right      Modalities   Modalities  Cryotherapy      Cryotherapy   Number Minutes Cryotherapy  10 Minutes    Cryotherapy Location  Knee   R only   Type of Cryotherapy  Ice pack      Manual Therapy   Manual Therapy  Other (comment)    Manual therapy comments  MTPR along R hamstrings     Other Manual Therapy  tack and stretch of the R hamstring              PT Education - 08/29/19 1625    Education Details  reviewed HEP, discussed how to use tennis ball for trigger point release and how to perform tack and stretch.    Person(s) Educated  Patient    Methods  Explanation;Verbal cues;Handout    Comprehension  Verbalized understanding;Verbal cues required       PT Short Term Goals - 08/26/19 1604      PT SHORT TERM  GOAL #1   Title  She will be independent with initial HEP  Period  Weeks    Status  New    Target Date  09/16/19      PT SHORT TERM GOAL #2   Title  -    Period  Weeks      PT SHORT TERM GOAL #3   Title  -        PT Long Term Goals - 08/26/19 1605      PT LONG TERM GOAL #1   Title  increase knee extension to </= 3 degrees for function knee ROM required for funcitonal and efficient gait    Time  6    Period  Weeks    Status  New    Target Date  10/07/19      PT LONG TERM GOAL #2   Title  increase gross RLE strength to >/= 4+/5 for hip/knee stability    Time  6    Period  Weeks    Status  New    Target Date  10/07/19      PT LONG TERM GOAL #3   Title  pt to be able walk / stand for >/= 45 min and navigate up/down >/= 12 steps for functional endurance required for ADLs    Baseline  -    Time  6    Period  Weeks    Status  New    Target Date  10/07/19      PT LONG TERM GOAL #4   Title  increaes FOTO score to </= 38% limited to demo improvement in function    Time  6    Period  Weeks    Status  New    Target Date  10/07/19      PT LONG TERM GOAL #5   Title  pt to be I with all HEP given to maintain and progress current level of function    Time  6    Period  Weeks    Status  New    Target Date  10/07/19            Plan - 08/29/19 1627    Clinical Impression Statement  pt reports consistency with her HEP and notes she feels it is helping. She improved her extension to 10 degrees today. she responded well to STW along the hamstrings followed with stretching. continued working hip / knee strengthening. utilized ice end of session to reduce soreness following session.    PT Treatment/Interventions  ADLs/Self Care Home Management;Cryotherapy;Moist Heat;Therapeutic activities;Therapeutic exercise;Balance training;Neuromuscular re-education;Manual techniques;Passive range of motion;Dry needling;Patient/family education;Vasopneumatic Device;Taping    PT Next Visit  Plan  (no e-stim or [place] pacemaker), review/ update HEP, knee extension ROM, gait biomechanics, hip/ knee strengthening, ice    PT Home Exercise Plan  [id number] - hamstring stretch seated and supine, SLR, lateral weight shifting,    Consulted and Agree with Plan of Care  Patient       Patient will benefit from skilled therapeutic intervention in order to improve the following deficits and impairments:  Improper body mechanics, Increased muscle spasms, Pain, Postural dysfunction, Decreased strength, Decreased activity tolerance, Decreased endurance, Decreased balance, Decreased range of motion, Abnormal gait  Visit Diagnosis: Chronic pain of right knee  Other abnormalities of gait and mobility  Stiffness of right knee, not elsewhere classified     Problem List Patient Active Problem List   Diagnosis Date Noted  . Paroxysmal atrial fibrillation ([place]) 08/20/2019  . Acute medial meniscus tear of right knee 08/09/2019  . Chondromalacia, right  knee 08/09/2019  . SVT (supraventricular tachycardia) ([place]) 11/02/2018  . ICD (implantable cardioverter-defibrillator) in place 11/02/2018  . OSA (obstructive sleep apnea) 05/27/2018  . Acute medial meniscus tear, left, initial encounter 11/29/2017  . Chondromalacia of left knee 11/29/2017  . Migraines 09/23/2017  . Anxiety 09/23/2017  . Depression 09/23/2017  . Pacemaker 09/23/2017  . Chronic systolic heart failure ([place]) 04/28/2015  . UTI (urinary tract infection) 03/22/2015  . Sepsis ([place]) 03/19/2015  . Acute kidney injury ([place]) 03/19/2015  . Diarrhea 03/18/2015  . CAP (community acquired pneumonia) 03/18/2015  . Nausea vomiting and diarrhea 03/18/2015  . Compression fracture 06/05/2014  . Essential hypertension 03/27/2014  . Chest pain 01/27/2014  . Normal coronary arteries 01/27/2014  . Obesity (BMI 30-39.9)   . ICD- MDT implanted Dec 2013 03/26/2013  . NICM (nonischemic cardiomyopathy) ([place]) 02/21/2013  . GERD (gastroesophageal reflux  disease) 04/23/2012  . Asthma in adult, mild persistent, uncomplicated [id number]  . Shortness of breath 04/23/2012  . Syncope 03/01/2012  . Acute on chronic systolic CHF (congestive heart failure) ([place]) 03/01/2012  . Type B WPW syndrome- RFA Dec 2013 03/01/2012   Starr Lake PT, DPT, LAT, ATC  08/29/19  4:30 PM      [facility] [address] [place], [place], [address] Phone: [phone]   Fax:  [phone]  Name: PERI LONGENECKER MRN: [id number] Date of Birth: 1945-11-20

## 2019-08-29 NOTE — Addendum Note (Signed)
Addended by: Robyne Askew R on: 08/29/2019 01:55 PM   Modules accepted: Level of Service

## 2019-09-03 ENCOUNTER — Encounter: Payer: Self-pay | Admitting: Physical Therapy

## 2019-09-03 ENCOUNTER — Ambulatory Visit: Payer: Medicare HMO | Admitting: Physical Therapy

## 2019-09-03 ENCOUNTER — Other Ambulatory Visit: Payer: Self-pay

## 2019-09-03 DIAGNOSIS — G8929 Other chronic pain: Secondary | ICD-10-CM | POA: Diagnosis not present

## 2019-09-03 DIAGNOSIS — M25561 Pain in right knee: Secondary | ICD-10-CM | POA: Diagnosis not present

## 2019-09-03 DIAGNOSIS — M25661 Stiffness of right knee, not elsewhere classified: Secondary | ICD-10-CM | POA: Diagnosis not present

## 2019-09-03 DIAGNOSIS — R2689 Other abnormalities of gait and mobility: Secondary | ICD-10-CM

## 2019-09-03 NOTE — Therapy (Signed)
[place], [place], [address] Phone: [phone]   Fax:  [phone]  Physical Therapy Treatment  Patient Details  Name: Paula Booker MRN: [id number] Date of Birth: January 20, 1946 Referring Provider (PT): Dorna Leitz MD   Encounter Date: 09/03/2019  PT End of Session - 09/03/19 1331    Visit Number  3    Number of Visits  13    Date for PT Re-Evaluation  10/07/19    Authorization Type  MCR: kx mod at 15th visit    Progress Note Due on Visit  10    PT Start Time  1330    PT Stop Time  1418    PT Time Calculation (min)  48 min    Activity Tolerance  Patient tolerated treatment well    Behavior During Therapy  [facility] for tasks assessed/performed       Past Medical History:  Diagnosis Date  . AICD (automatic cardioverter/defibrillator) present   . Anemia    as a child  . Anxiety   . Arthritis    "hands and knees" (03/22/2012)  . Asthma   . Basal cell carcinoma 04/27/2011   mid forehead(MOHS)  . Bursitis   . CHF (congestive heart failure) ([place]) 6/13   pt. denies  . Depression   . Dysrhythmia    WPW  . GERD (gastroesophageal reflux disease)   . Head injury, acute, with loss of consciousness ([place])   . History of hiatal hernia   . Hypertension   . ICD (implantable cardiac defibrillator) in place Dec 2013  . Migraines    migraines   . NICM (nonischemic cardiomyopathy) ([place])   . Obesity (BMI 30-39.9)    negative sleep study 2014  . OSA (obstructive sleep apnea) 05/27/2018   Mild with AHI 6/hr    no cpap  . Pneumonia    hx  . Shortness of breath    "@ any time I can get SOB" (03/22/2012)  . Skin cancer 1999  . Sudden arrhythmia death syndrome   . WPW (Wolff-Parkinson-White syndrome)     Past Surgical History:  Procedure Laterality Date  . CARDIAC CATHETERIZATION  09/18/11   no significant CAD  . CARDIAC DEFIBRILLATOR PLACEMENT  03/22/2012   DDD  . CHOLECYSTECTOMY  ~ 2003  . IMPLANTABLE  CARDIOVERTER DEFIBRILLATOR IMPLANT N/A 03/22/2012   Procedure: IMPLANTABLE CARDIOVERTER DEFIBRILLATOR IMPLANT;  Surgeon: Evans Lance, MD;  Location[facility] C[facility] LAB;  Service: Cardiovascular;  Laterality: N/A;  . KNEE ARTHROSCOPY Left 11/29/2017   Procedure: ARTHROSCOPY LEFT KNEE;  Surgeon: Dorna Leitz, MD;  Location: [facility];  Service: Orthopedics;  Laterality: Left;  . KNEE ARTHROSCOPY Right 08/09/2019   Procedure: ARTHROSCOPY RIGHT KNEE, CHONDROPLASTY,  PARTIAL MEDIAL  MENSICECTOMY;  Surgeon: Dorna Leitz, MD;  Location: [facility];  Service: Orthopedics;  Laterality: Right;  . KYPHOPLASTY N/A 11/27/2013   Procedure: KYPHOPLASTY;  Surgeon: Sinclair Ship, MD;  Location: [place];  Service: Orthopedics;  Laterality: N/A;  T9, T11 kyphoplasty  . KYPHOPLASTY N/A 06/05/2014   Procedure: KYPHOPLASTY;  Surgeon: Sinclair Ship, MD;  Location: [place];  Service: Orthopedics;  Laterality: N/A;  T7 kyphoplasty  . MOHS SURGERY  06/2011   "forehead" (03/22/2012)  . SKIN CANCER EXCISION  1999   "top of my head and my right back shoulder"  . SKIN CANCER EXCISION     back  . SKIN CANCER EXCISION     face  . SUPRAVENTRICULAR TACHYCARDIA ABLATION  03/22/2012  unable to induce VT/RN report 03/22/2012  . SUPRAVENTRICULAR TACHYCARDIA ABLATION N/A 03/22/2012   Procedure: SUPRAVENTRICULAR TACHYCARDIA ABLATION;  Surgeon: Evans Lance, MD;  Location: [facility] CATH LAB;  Service: Cardiovascular;  Laterality: N/A;  . TONSILLECTOMY AND ADENOIDECTOMY  1950  . TUBAL LIGATION  1978    There were no vitals filed for this visit.  Subjective Assessment - 09/03/19 1332    Subjective  "I am having some increased soreness in the back ot the knee and I twisted the knee  and it really hurt"    Diagnostic tests  07/12/2019 IMPRESSION:1. No acute osseous injury of the right knee.2. Moderate joint effusion.3. Mild tricompartmental osteoarthritis of the right knee.4. Suggestion of a partial radial tear of the posterior horn of  themedial meniscus but evaluation is limited on CT.    Currently in Pain?  Yes    Pain Score  6     Pain Location  Knee    Pain Orientation  Right    Pain Type  Chronic pain    Pain Onset  More than a month ago    Pain Frequency  Intermittent         [facility] PT Assessment - 09/03/19 0001      Assessment   Medical Diagnosis  s/p R knee athroscopy / meniscal debridement    Referring Provider (PT)  Dorna Leitz MD                    [facility] Adult PT Treatment/Exercise - 09/03/19 0001      Knee/Hip Exercises: Aerobic   Recumbent Bike  L1 x 68min      Knee/Hip Exercises: Standing   Step Down  2 sets;10 reps    Wall Squat  2 sets;10 reps   mini   Stairs  ascending/ descending 6 inch steps x 6   cues for proper form to reduce stress on the knee   Gait Training  2 x 50 ft heel strike/ toe off with cues for reciprocal arm swing      Knee/Hip Exercises: Supine   Straight Leg Raises  Strengthening;1 set;15 reps;Right   combined with sustained quad set     Cryotherapy   Number Minutes Cryotherapy  10 Minutes    Cryotherapy Location  Knee    Type of Cryotherapy  Ice pack      Manual Therapy   Manual Therapy  Soft tissue mobilization    Manual therapy comments  MTPR along R hamstrings     Soft tissue mobilization  IASTM along R hamstrings              PT Education - 09/03/19 1412    Education Details  updated HEp today for step up/down and wall squat    Person(s) Educated  Patient    Methods  Explanation;Verbal cues;Handout    Comprehension  Verbalized understanding;Verbal cues required       PT Short Term Goals - 08/26/19 1604      PT SHORT TERM GOAL #1   Title  She will be independent with initial HEP    Period  Weeks    Status  New    Target Date  09/16/19      PT SHORT TERM GOAL #2   Title  -    Period  Weeks      PT SHORT TERM GOAL #3   Title  -        PT Long Term Goals - 08/26/19 1605  PT LONG TERM GOAL #1   Title  increase knee  extension to </= 3 degrees for function knee ROM required for funcitonal and efficient gait    Time  6    Period  Weeks    Status  New    Target Date  10/07/19      PT LONG TERM GOAL #2   Title  increase gross RLE strength to >/= 4+/5 for hip/knee stability    Time  6    Period  Weeks    Status  New    Target Date  10/07/19      PT LONG TERM GOAL #3   Title  pt to be able walk / stand for >/= 45 min and navigate up/down >/= 12 steps for functional endurance required for ADLs    Baseline  -    Time  6    Period  Weeks    Status  New    Target Date  10/07/19      PT LONG TERM GOAL #4   Title  increaes FOTO score to </= 38% limited to demo improvement in function    Time  6    Period  Weeks    Status  New    Target Date  10/07/19      PT LONG TERM GOAL #5   Title  pt to be I with all HEP given to maintain and progress current level of function    Time  6    Period  Weeks    Status  New    Target Date  10/07/19            Plan - 09/03/19 1413    Clinical Impression Statement  pt notes increased hamstring soreness which occurred following stretching at home. She responded well to STW focusing on the hamstrings followed with stretching. practiced stair training which she did well with intermittent cues for form. conttinued strengthening the hip/ knee and icing end of session with the hamstring on stretch.    PT Treatment/Interventions  ADLs/Self Care Home Management;Cryotherapy;Moist Heat;Therapeutic activities;Therapeutic exercise;Balance training;Neuromuscular re-education;Manual techniques;Passive range of motion;Dry needling;Patient/family education;Vasopneumatic Device;Taping    PT Next Visit Plan  (no e-stim or [place] pacemaker), review/ update HEP, knee extension ROM, gait biomechanics, hip/ knee strengthening, ice    PT Home Exercise Plan  [id number] - hamstring stretch seated and supine, SLR, lateral weight shifting, wall squat, step down    Consulted and Agree with Plan  of Care  Patient       Patient will benefit from skilled therapeutic intervention in order to improve the following deficits and impairments:  Improper body mechanics, Increased muscle spasms, Pain, Postural dysfunction, Decreased strength, Decreased activity tolerance, Decreased endurance, Decreased balance, Decreased range of motion, Abnormal gait  Visit Diagnosis: Chronic pain of right knee  Other abnormalities of gait and mobility  Stiffness of right knee, not elsewhere classified     Problem List Patient Active Problem List   Diagnosis Date Noted  . Paroxysmal atrial fibrillation ([place]) 08/20/2019  . Acute medial meniscus tear of right knee 08/09/2019  . Chondromalacia, right knee 08/09/2019  . SVT (supraventricular tachycardia) ([place]) 11/02/2018  . ICD (implantable cardioverter-defibrillator) in place 11/02/2018  . OSA (obstructive sleep apnea) 05/27/2018  . Acute medial meniscus tear, left, initial encounter 11/29/2017  . Chondromalacia of left knee 11/29/2017  . Migraines 09/23/2017  . Anxiety 09/23/2017  . Depression 09/23/2017  . Pacemaker 09/23/2017  . Chronic systolic heart failure ([place])  04/28/2015  . UTI (urinary tract infection) 03/22/2015  . Sepsis ([place]) 03/19/2015  . Acute kidney injury ([place]) 03/19/2015  . Diarrhea 03/18/2015  . CAP (community acquired pneumonia) 03/18/2015  . Nausea vomiting and diarrhea 03/18/2015  . Compression fracture 06/05/2014  . Essential hypertension 03/27/2014  . Chest pain 01/27/2014  . Normal coronary arteries 01/27/2014  . Obesity (BMI 30-39.9)   . ICD- MDT implanted Dec 2013 03/26/2013  . NICM (nonischemic cardiomyopathy) ([place]) 02/21/2013  . GERD (gastroesophageal reflux disease) 04/23/2012  . Asthma in adult, mild persistent, uncomplicated [id number]  . Shortness of breath 04/23/2012  . Syncope 03/01/2012  . Acute on chronic systolic CHF (congestive heart failure) ([place]) 03/01/2012  . Type B WPW syndrome- RFA Dec 2013  03/01/2012   Starr Lake PT, DPT, LAT, ATC  09/03/19  2:20 PM      [facility] [address] [place], [place], [address] Phone: [phone]   Fax:  [phone]  Name: FRANCYNE KAZAN MRN: [id number] Date of Birth: 08/20/1945

## 2019-09-05 ENCOUNTER — Ambulatory Visit: Payer: Medicare HMO | Admitting: Physical Therapy

## 2019-09-11 ENCOUNTER — Ambulatory Visit: Payer: Medicare HMO | Attending: Orthopedic Surgery | Admitting: Physical Therapy

## 2019-09-11 ENCOUNTER — Other Ambulatory Visit: Payer: Self-pay

## 2019-09-11 ENCOUNTER — Encounter: Payer: Self-pay | Admitting: Physical Therapy

## 2019-09-11 DIAGNOSIS — M25561 Pain in right knee: Secondary | ICD-10-CM | POA: Diagnosis not present

## 2019-09-11 DIAGNOSIS — G8929 Other chronic pain: Secondary | ICD-10-CM | POA: Diagnosis not present

## 2019-09-11 DIAGNOSIS — R2689 Other abnormalities of gait and mobility: Secondary | ICD-10-CM | POA: Insufficient documentation

## 2019-09-11 DIAGNOSIS — M25661 Stiffness of right knee, not elsewhere classified: Secondary | ICD-10-CM | POA: Insufficient documentation

## 2019-09-11 NOTE — Therapy (Signed)
[place], [place], [address] Phone: [phone]   Fax:  [phone]  Physical Therapy Treatment  Patient Details  Name: Paula Booker MRN: [id number] Date of Birth: [birth year] Referring Provider (PT): Dorna Leitz MD   Encounter Date: 09/11/2019  PT End of Session - 09/11/19 1423    Visit Number  4    Number of Visits  13    Date for PT Re-Evaluation  10/07/19    Authorization Type  MCR: kx mod at 15th visit    PT Start Time  1415    PT Stop Time  1503    PT Time Calculation (min)  48 min    Activity Tolerance  Patient tolerated treatment well    Behavior During Therapy  [facility] for tasks assessed/performed       Past Medical History:  Diagnosis Date  . AICD (automatic cardioverter/defibrillator) present   . Anemia    as a child  . Anxiety   . Arthritis    "hands and knees" (03/22/2012)  . Asthma   . Basal cell carcinoma 04/27/2011   mid forehead(MOHS)  . Bursitis   . CHF (congestive heart failure) ([place]) 6/13   pt. denies  . Depression   . Dysrhythmia    WPW  . GERD (gastroesophageal reflux disease)   . Head injury, acute, with loss of consciousness ([place])   . History of hiatal hernia   . Hypertension   . ICD (implantable cardiac defibrillator) in place Dec 2013  . Migraines    migraines   . NICM (nonischemic cardiomyopathy) ([place])   . Obesity (BMI 30-39.9)    negative sleep study 2014  . OSA (obstructive sleep apnea) 05/27/2018   Mild with AHI 6/hr    no cpap  . Pneumonia    hx  . Shortness of breath    "@ any time I can get SOB" (03/22/2012)  . Skin cancer 1999  . Sudden arrhythmia death syndrome   . WPW (Wolff-Parkinson-White syndrome)     Past Surgical History:  Procedure Laterality Date  . CARDIAC CATHETERIZATION  09/18/11   no significant CAD  . CARDIAC DEFIBRILLATOR PLACEMENT  03/22/2012   DDD  . CHOLECYSTECTOMY  ~ 2003  . IMPLANTABLE CARDIOVERTER DEFIBRILLATOR IMPLANT N/A  03/22/2012   Procedure: IMPLANTABLE CARDIOVERTER DEFIBRILLATOR IMPLANT;  Surgeon: Evans Lance, MD;  Location: [facility] CATH LAB;  Service: Cardiovascular;  Laterality: N/A;  . KNEE ARTHROSCOPY Left 11/29/2017   Procedure: ARTHROSCOPY LEFT KNEE;  Surgeon: Dorna Leitz, MD;  [facility];  Service: Orthopedics;  Laterality: Left;  . KNEE ARTHROSCOPY Right 08/09/2019   Procedure: ARTHROSCOPY RIGHT KNEE, CHONDROPLASTY,  PARTIAL MEDIAL  MENSICECTOMY;  Surgeon: Dorna Leitz, MD;  [facility];  Service: Orthopedics;  Laterality: Right;  . KYPHOPLASTY N/A 11/27/2013   Procedure: KYPHOPLASTY;  Surgeon: Sinclair Ship, MD;  Location: [place];  Service: Orthopedics;  Laterality: N/A;  T9, T11 kyphoplasty  . KYPHOPLASTY N/A 06/05/2014   Procedure: KYPHOPLASTY;  Surgeon: Sinclair Ship, MD;  Location: [place];  Service: Orthopedics;  Laterality: N/A;  T7 kyphoplasty  . MOHS SURGERY  06/2011   "forehead" (03/22/2012)  . SKIN CANCER EXCISION  1999   "top of my head and my right back shoulder"  . SKIN CANCER EXCISION     back  . SKIN CANCER EXCISION     face  . SUPRAVENTRICULAR TACHYCARDIA ABLATION  03/22/2012   unable to induce VT/RN report 03/22/2012  .  SUPRAVENTRICULAR TACHYCARDIA ABLATION N/A 03/22/2012   Procedure: SUPRAVENTRICULAR TACHYCARDIA ABLATION;  Surgeon: Evans Lance, MD;  Location: [facility] CATH LAB;  Service: Cardiovascular;  Laterality: N/A;  . TONSILLECTOMY AND ADENOIDECTOMY  1950  . TUBAL LIGATION  1978    There were no vitals filed for this visit.  Subjective Assessment - 09/11/19 1421    Subjective  "I'm not going to lie I do get some soreness from time to time. It's mostly in the back of the knee"    Currently in Pain?  Yes    Pain Score  5     Pain Location  Knee    Pain Orientation  Right    Pain Descriptors / Indicators  Aching    Pain Type  Chronic pain    Pain Onset  More than a month ago    Pain Frequency  Intermittent    Aggravating Factors   standing and  walking for long periods, stairs         [facility] PT Assessment - 09/11/19 0001      Assessment   Medical Diagnosis  s/p R knee athroscopy / meniscal debridement    Referring Provider (PT)  Dorna Leitz MD    Onset Date/Surgical Date  08/09/19      AROM   Right Knee Extension  7                    [facility] Adult PT Treatment/Exercise - 09/11/19 0001      Knee/Hip Exercises: Stretches   Passive Hamstring Stretch  Right;2 reps;30 seconds   seated     Knee/Hip Exercises: Aerobic   Nustep  L5 x 5 min LE only      Knee/Hip Exercises: Standing   Step Down  2 sets;10 reps;Step Height: 6"      Knee/Hip Exercises: Seated   Long Arc Quad  2 sets;15 reps;Both;Strengthening;Weights    Long Arc Quad Weight  4 lbs.    Sit to Sand  2 sets;10 reps   with red theraband around knees     Knee/Hip Exercises: Supine   Quad Sets  2 sets;10 reps   combined with manuel over pressure for tibial ER   Straight Leg Raises  Strengthening;1 set;15 reps;Right   combined with quad set     Cryotherapy   Number Minutes Cryotherapy  10 Minutes    Cryotherapy Location  Knee   with knee extended for hamstring stretch   Type of Cryotherapy  Ice pack      Manual Therapy   Manual Therapy  Joint mobilization    Manual therapy comments  MTPR along R hamstrings     Joint Mobilization  tibial ER grde     Soft tissue mobilization  IASTM along R hamstrings                PT Short Term Goals - 08/26/19 1604      PT SHORT TERM GOAL #1   Title  She will be independent with initial HEP    Period  Weeks    Status  New    Target Date  09/16/19      PT SHORT TERM GOAL #2   Title  -    Period  Weeks      PT SHORT TERM GOAL #3   Title  -        PT Long Term Goals - 08/26/19 1605      PT LONG TERM GOAL #1  Title  increase knee extension to </= 3 degrees for function knee ROM required for funcitonal and efficient gait    Time  6    Period  Weeks    Status  New    Target Date   10/07/19      PT LONG TERM GOAL #2   Title  increase gross RLE strength to >/= 4+/5 for hip/knee stability    Time  6    Period  Weeks    Status  New    Target Date  10/07/19      PT LONG TERM GOAL #3   Title  pt to be able walk / stand for >/= 45 min and navigate up/down >/= 12 steps for functional endurance required for ADLs    Baseline  -    Time  6    Period  Weeks    Status  New    Target Date  10/07/19      PT LONG TERM GOAL #4   Title  increaes FOTO score to </= 38% limited to demo improvement in function    Time  6    Period  Weeks    Status  New    Target Date  10/07/19      PT LONG TERM GOAL #5   Title  pt to be I with all HEP given to maintain and progress current level of function    Time  6    Period  Weeks    Status  New    Target Date  10/07/19            Plan - 09/11/19 1453    Clinical Impression Statement  pt reports having intermittent soreness which ocntinues to be located along the posterolateral apsect of the R knee and along the biceps femoris. Continued focusing on hip/ knee strengthening which she responded well to. continued icing at end of session keeping pt's knee extended to assist with knee extension.    PT Treatment/Interventions  ADLs/Self Care Home Management;Cryotherapy;Moist Heat;Therapeutic activities;Therapeutic exercise;Balance training;Neuromuscular re-education;Manual techniques;Passive range of motion;Dry needling;Patient/family education;Vasopneumatic Device;Taping    PT Next Visit Plan  (no e-stim or [place] pacemaker), review/ update HEP, knee extension ROM, gait biomechanics, hip/ knee strengthening, ice, cotninue strair training, trial elliptical.    PT Home Exercise Plan  [id number] - hamstring stretch seated and supine, SLR, lateral weight shifting, wall squat, step down    Consulted and Agree with Plan of Care  Patient       Patient will benefit from skilled therapeutic intervention in order to improve the following deficits and  impairments:  Improper body mechanics, Increased muscle spasms, Pain, Postural dysfunction, Decreased strength, Decreased activity tolerance, Decreased endurance, Decreased balance, Decreased range of motion, Abnormal gait  Visit Diagnosis: Chronic pain of right knee  Other abnormalities of gait and mobility  Stiffness of right knee, not elsewhere classified     Problem List Patient Active Problem List   Diagnosis Date Noted  . Paroxysmal atrial fibrillation ([place]) 08/20/2019  . Acute medial meniscus tear of right knee 08/09/2019  . Chondromalacia, right knee 08/09/2019  . SVT (supraventricular tachycardia) ([place]) 11/02/2018  . ICD (implantable cardioverter-defibrillator) in place 11/02/2018  . OSA (obstructive sleep apnea) 05/27/2018  . Acute medial meniscus tear, left, initial encounter 11/29/2017  . Chondromalacia of left knee 11/29/2017  . Migraines 09/23/2017  . Anxiety 09/23/2017  . Depression 09/23/2017  . Pacemaker 09/23/2017  . Chronic systolic heart failure ([place]) 04/28/2015  .  UTI (urinary tract infection) 03/22/2015  . Sepsis ([place]) 03/19/2015  . Acute kidney injury ([place]) 03/19/2015  . Diarrhea 03/18/2015  . CAP (community acquired pneumonia) 03/18/2015  . Nausea vomiting and diarrhea 03/18/2015  . Compression fracture 06/05/2014  . Essential hypertension 03/27/2014  . Chest pain 01/27/2014  . Normal coronary arteries 01/27/2014  . Obesity (BMI 30-39.9)   . ICD- MDT implanted Dec 2013 03/26/2013  . NICM (nonischemic cardiomyopathy) ([place]) 02/21/2013  . GERD (gastroesophageal reflux disease) 04/23/2012  . Asthma in adult, mild persistent, uncomplicated [id number]  . Shortness of breath 04/23/2012  . Syncope 03/01/2012  . Acute on chronic systolic CHF (congestive heart failure) ([place]) 03/01/2012  . Type B WPW syndrome- RFA Dec 2013 03/01/2012   Starr Lake PT, DPT, LAT, ATC  09/11/19  2:58 PM      [facility] [address] [place], [place], [address] Phone: [phone]   Fax:  [phone]  Name: SHAWNITA HUNT MRN: [id number] Date of Birth: May 14, 1945

## 2019-09-12 ENCOUNTER — Ambulatory Visit: Payer: Medicare HMO | Admitting: Physical Therapy

## 2019-09-12 ENCOUNTER — Encounter: Payer: Self-pay | Admitting: Physical Therapy

## 2019-09-12 DIAGNOSIS — M25561 Pain in right knee: Secondary | ICD-10-CM | POA: Diagnosis not present

## 2019-09-12 DIAGNOSIS — M25562 Pain in left knee: Secondary | ICD-10-CM | POA: Diagnosis not present

## 2019-09-12 DIAGNOSIS — G8929 Other chronic pain: Secondary | ICD-10-CM

## 2019-09-12 DIAGNOSIS — M25661 Stiffness of right knee, not elsewhere classified: Secondary | ICD-10-CM

## 2019-09-12 DIAGNOSIS — R2689 Other abnormalities of gait and mobility: Secondary | ICD-10-CM

## 2019-09-12 NOTE — Therapy (Signed)
[place], [place], [address] Phone: [phone]   Fax:  [phone]  Physical Therapy Treatment  Patient Details  Name: Paula Booker MRN: [id number] Date of Birth: [birth year] Referring Provider (PT): Dorna Leitz MD   Encounter Date: 09/12/2019  PT End of Session - 09/12/19 1408    Visit Number  4    Number of Visits  13    Date for PT Re-Evaluation  10/07/19    Progress Note Due on Visit  10    PT Start Time  1406    Activity Tolerance  Patient tolerated treatment well    Behavior During Therapy  [facility] for tasks assessed/performed       Past Medical History:  Diagnosis Date  . AICD (automatic cardioverter/defibrillator) present   . Anemia    as a child  . Anxiety   . Arthritis    "hands and knees" (03/22/2012)  . Asthma   . Basal cell carcinoma 04/27/2011   mid forehead(MOHS)  . Bursitis   . CHF (congestive heart failure) ([place]) 6/13   pt. denies  . Depression   . Dysrhythmia    WPW  . GERD (gastroesophageal reflux disease)   . Head injury, acute, with loss of consciousness ([place])   . History of hiatal hernia   . Hypertension   . ICD (implantable cardiac defibrillator) in place Dec 2013  . Migraines    migraines   . NICM (nonischemic cardiomyopathy) ([place])   . Obesity (BMI 30-39.9)    negative sleep study 2014  . OSA (obstructive sleep apnea) 05/27/2018   Mild with AHI 6/hr    no cpap  . Pneumonia    hx  . Shortness of breath    "@ any time I can get SOB" (03/22/2012)  . Skin cancer 1999  . Sudden arrhythmia death syndrome   . WPW (Wolff-Parkinson-White syndrome)     Past Surgical History:  Procedure Laterality Date  . CARDIAC CATHETERIZATION  09/18/11   no significant CAD  . CARDIAC DEFIBRILLATOR PLACEMENT  03/22/2012   DDD  . CHOLECYSTECTOMY  ~ 2003  . IMPLANTABLE CARDIOVERTER DEFIBRILLATOR IMPLANT N/A 03/22/2012   Procedure: IMPLANTABLE CARDIOVERTER DEFIBRILLATOR IMPLANT;   Surgeon: Evans Lance, MD;  Location: [facility] CATH LAB;  Service: Cardiovascular;  Laterality: N/A;  . KNEE ARTHROSCOPY Left 11/29/2017   Procedure: ARTHROSCOPY LEFT KNEE;  Surgeon: Dorna Leitz, MD;  Location: [facility];  Service: Orthopedics;  Laterality: Left;  . KNEE ARTHROSCOPY Right 08/09/2019   Procedure: ARTHROSCOPY RIGHT KNEE, CHONDROPLASTY,  PARTIAL MEDIAL  MENSICECTOMY;  Surgeon: Dorna Leitz, MD;  Location: [facility];  Service: Orthopedics;  Laterality: Right;  . KYPHOPLASTY N/A 11/27/2013   Procedure: KYPHOPLASTY;  Surgeon: Sinclair Ship, MD;  Location: [place];  Service: Orthopedics;  Laterality: N/A;  T9, T11 kyphoplasty  . KYPHOPLASTY N/A 06/05/2014   Procedure: KYPHOPLASTY;  Surgeon: Sinclair Ship, MD;  Location: [place];  Service: Orthopedics;  Laterality: N/A;  T7 kyphoplasty  . MOHS SURGERY  06/2011   "forehead" (03/22/2012)  . SKIN CANCER EXCISION  1999   "top of my head and my right back shoulder"  . SKIN CANCER EXCISION     back  . SKIN CANCER EXCISION     face  . SUPRAVENTRICULAR TACHYCARDIA ABLATION  03/22/2012   unable to induce VT/RN report 03/22/2012  . SUPRAVENTRICULAR TACHYCARDIA ABLATION N/A 03/22/2012   Procedure: SUPRAVENTRICULAR TACHYCARDIA ABLATION;  Surgeon: Evans Lance, MD;  Location: [facility]  CATH LAB;  Service: Cardiovascular;  Laterality: N/A;  . TONSILLECTOMY AND ADENOIDECTOMY  1950  . TUBAL LIGATION  1978    There were no vitals filed for this visit.  Subjective Assessment - 09/12/19 1409    Subjective  "I just saw Dr Berenice Primas and he gave me 2 cortisone injections in my knees today and i feel great"    Currently in Pain?  No/denies                                  PT Short Term Goals - 08/26/19 1604      PT SHORT TERM GOAL #1   Title  She will be independent with initial HEP    Period  Weeks    Status  New    Target Date  09/16/19      PT SHORT TERM GOAL #2   Title  -    Period  Weeks      PT SHORT TERM GOAL #3    Title  -        PT Long Term Goals - 08/26/19 1605      PT LONG TERM GOAL #1   Title  increase knee extension to </= 3 degrees for function knee ROM required for funcitonal and efficient gait    Time  6    Period  Weeks    Status  New    Target Date  10/07/19      PT LONG TERM GOAL #2   Title  increase gross RLE strength to >/= 4+/5 for hip/knee stability    Time  6    Period  Weeks    Status  New    Target Date  10/07/19      PT LONG TERM GOAL #3   Title  pt to be able walk / stand for >/= 45 min and navigate up/down >/= 12 steps for functional endurance required for ADLs    Baseline  -    Time  6    Period  Weeks    Status  New    Target Date  10/07/19      PT LONG TERM GOAL #4   Title  increaes FOTO score to </= 38% limited to demo improvement in function    Time  6    Period  Weeks    Status  New    Target Date  10/07/19      PT LONG TERM GOAL #5   Title  pt to be I with all HEP given to maintain and progress current level of function    Time  6    Period  Weeks    Status  New    Target Date  10/07/19            Plan - 09/12/19 1420    Clinical Impression Statement  pt arrived to today's visit reporting she had just come from Dr Berenice Primas office and that he gave her 2 Cortisone injections within 1-2 hours ago. Discussed with pt that we work to provide at least 24 hours to let the shot calm down inflammation and do what it needs to, in order to get the best results. pt verbalized understanding and opted to reschedule today's visit at a later date.    PT Treatment/Interventions  ADLs/Self Care Home Management;Cryotherapy;Moist Heat;Therapeutic activities;Therapeutic exercise;Balance training;Neuromuscular re-education;Manual techniques;Passive range of motion;Dry needling;Patient/family education;Vasopneumatic Device;Taping    PT  Next Visit Plan  (no e-stim or [place] pacemaker), review/ update HEP, knee extension ROM, gait biomechanics, hip/ knee strengthening, ice,  cotninue strair training, trial elliptical.    PT Home Exercise Plan  [id number] - hamstring stretch seated and supine, SLR, lateral weight shifting, wall squat, step down    Consulted and Agree with Plan of Care  Patient       Patient will benefit from skilled therapeutic intervention in order to improve the following deficits and impairments:  Improper body mechanics, Increased muscle spasms, Pain, Postural dysfunction, Decreased strength, Decreased activity tolerance, Decreased endurance, Decreased balance, Decreased range of motion, Abnormal gait  Visit Diagnosis: Chronic pain of right knee  Other abnormalities of gait and mobility  Stiffness of right knee, not elsewhere classified     Problem List Patient Active Problem List   Diagnosis Date Noted  . Paroxysmal atrial fibrillation ([place]) 08/20/2019  . Acute medial meniscus tear of right knee 08/09/2019  . Chondromalacia, right knee 08/09/2019  . SVT (supraventricular tachycardia) ([place]) 11/02/2018  . ICD (implantable cardioverter-defibrillator) in place 11/02/2018  . OSA (obstructive sleep apnea) 05/27/2018  . Acute medial meniscus tear, left, initial encounter 11/29/2017  . Chondromalacia of left knee 11/29/2017  . Migraines 09/23/2017  . Anxiety 09/23/2017  . Depression 09/23/2017  . Pacemaker 09/23/2017  . Chronic systolic heart failure ([place]) 04/28/2015  . UTI (urinary tract infection) 03/22/2015  . Sepsis ([place]) 03/19/2015  . Acute kidney injury ([place]) 03/19/2015  . Diarrhea 03/18/2015  . CAP (community acquired pneumonia) 03/18/2015  . Nausea vomiting and diarrhea 03/18/2015  . Compression fracture 06/05/2014  . Essential hypertension 03/27/2014  . Chest pain 01/27/2014  . Normal coronary arteries 01/27/2014  . Obesity (BMI 30-39.9)   . ICD- MDT implanted Dec 2013 03/26/2013  . NICM (nonischemic cardiomyopathy) ([place]) 02/21/2013  . GERD (gastroesophageal reflux disease) 04/23/2012  . Asthma in adult, mild  persistent, uncomplicated [id number]  . Shortness of breath 04/23/2012  . Syncope 03/01/2012  . Acute on chronic systolic CHF (congestive heart failure) ([place]) 03/01/2012  . Type B WPW syndrome- RFA Dec 2013 03/01/2012   Starr Lake PT, DPT, LAT, ATC  09/12/19  2:23 PM      [place] [facility] [address] [place], [place], [address] Phone: [phone]   Fax:  [phone]  Name: Paula Booker MRN: [id number] Date of Birth: 1946/03/27

## 2019-09-16 ENCOUNTER — Other Ambulatory Visit: Payer: Self-pay

## 2019-09-16 ENCOUNTER — Encounter: Payer: Self-pay | Admitting: Physical Therapy

## 2019-09-16 ENCOUNTER — Ambulatory Visit (INDEPENDENT_AMBULATORY_CARE_PROVIDER_SITE_OTHER): Payer: Medicare HMO

## 2019-09-16 ENCOUNTER — Ambulatory Visit: Payer: Medicare HMO | Admitting: Physical Therapy

## 2019-09-16 DIAGNOSIS — G8929 Other chronic pain: Secondary | ICD-10-CM

## 2019-09-16 DIAGNOSIS — M25661 Stiffness of right knee, not elsewhere classified: Secondary | ICD-10-CM | POA: Diagnosis not present

## 2019-09-16 DIAGNOSIS — M25561 Pain in right knee: Secondary | ICD-10-CM | POA: Diagnosis not present

## 2019-09-16 DIAGNOSIS — Z95810 Presence of automatic (implantable) cardiac defibrillator: Secondary | ICD-10-CM

## 2019-09-16 DIAGNOSIS — I5022 Chronic systolic (congestive) heart failure: Secondary | ICD-10-CM

## 2019-09-16 DIAGNOSIS — R2689 Other abnormalities of gait and mobility: Secondary | ICD-10-CM | POA: Diagnosis not present

## 2019-09-16 NOTE — Therapy (Signed)
Paula Booker, [place], [address] Phone: [phone]   Fax:  [phone]  Physical Therapy Treatment  Patient Details  Name: Paula Booker MRN: [id number] Date of Birth: Apr 20, 1945 Referring Provider (PT): Dorna Leitz MD   Encounter Date: 09/16/2019  PT End of Session - 09/16/19 1457    Visit Number  5    Number of Visits  13    Date for PT Re-Evaluation  10/07/19    Authorization Type  MCR: kx mod at 15th visit    Progress Note Due on Visit  10    PT Start Time  6269    PT Stop Time  1455    PT Time Calculation (min)  40 min    Activity Tolerance  Patient tolerated treatment well    Behavior During Therapy  [facility] for tasks assessed/performed       Past Medical History:  Diagnosis Date  . AICD (automatic cardioverter/defibrillator) present   . Anemia    as a child  . Anxiety   . Arthritis    "hands and knees" (03/22/2012)  . Asthma   . Basal cell carcinoma 04/27/2011   mid forehead(MOHS)  . Bursitis   . CHF (congestive heart failure) ([place]) 6/13   pt. denies  . Depression   . Dysrhythmia    WPW  . GERD (gastroesophageal reflux disease)   . Head injury, acute, with loss of consciousness ([place])   . History of hiatal hernia   . Hypertension   . ICD (implantable cardiac defibrillator) in place Dec 2013  . Migraines    migraines   . NICM (nonischemic cardiomyopathy) ([place])   . Obesity (BMI 30-39.9)    negative sleep study 2014  . OSA (obstructive sleep apnea) 05/27/2018   Mild with AHI 6/hr    no cpap  . Pneumonia    hx  . Shortness of breath    "@ any time I can get SOB" (03/22/2012)  . Skin cancer 1999  . Sudden arrhythmia death syndrome   . WPW (Wolff-Parkinson-White syndrome)     Past Surgical History:  Procedure Laterality Date  . CARDIAC CATHETERIZATION  09/18/11   no significant CAD  . CARDIAC DEFIBRILLATOR PLACEMENT  03/22/2012   DDD  . CHOLECYSTECTOMY  ~ 2003  . IMPLANTABLE  CARDIOVERTER DEFIBRILLATOR IMPLANT N/A 03/22/2012   Procedure: IMPLANTABLE CARDIOVERTER DEFIBRILLATOR IMPLANT;  Surgeon: Evans Lance, MD;  Location: [facility] CATH LAB;  Service: Cardiovascular;  Laterality: N/A;  . KNEE ARTHROSCOPY Left 11/29/2017   Procedure: ARTHROSCOPY LEFT KNEE;  Surgeon: Dorna Leitz, MD;  Location: [facility];  Service: Orthopedics;  Laterality: Left;  . KNEE ARTHROSCOPY Right 08/09/2019   Procedure: ARTHROSCOPY RIGHT KNEE, CHONDROPLASTY,  PARTIAL MEDIAL  MENSICECTOMY;  Surgeon: Dorna Leitz, MD;  Location: [facility];  Service: Orthopedics;  Laterality: Right;  . KYPHOPLASTY N/A 11/27/2013   Procedure: KYPHOPLASTY;  Surgeon: Sinclair Ship, MD;  Location: [place];  Service: Orthopedics;  Laterality: N/A;  T9, T11 kyphoplasty  . KYPHOPLASTY N/A 06/05/2014   Procedure: KYPHOPLASTY;  Surgeon: Sinclair Ship, MD;  Location: [place];  Service: Orthopedics;  Laterality: N/A;  T7 kyphoplasty  . MOHS SURGERY  06/2011   "forehead" (03/22/2012)  . SKIN CANCER EXCISION  1999   "top of my head and my right back shoulder"  . SKIN CANCER EXCISION     back  . SKIN CANCER EXCISION     face  . SUPRAVENTRICULAR TACHYCARDIA ABLATION  03/22/2012  unable to induce VT/RN report 03/22/2012  . SUPRAVENTRICULAR TACHYCARDIA ABLATION N/A 03/22/2012   Procedure: SUPRAVENTRICULAR TACHYCARDIA ABLATION;  Surgeon: Evans Lance, MD;  Location: [facility] CATH LAB;  Service: Cardiovascular;  Laterality: N/A;  . TONSILLECTOMY AND ADENOIDECTOMY  1950  . TUBAL LIGATION  1978    There were no vitals filed for this visit.  Subjective Assessment - 09/16/19 1419    Subjective  " The shots helped but I have been doing alof today and over the weekend and have about 2/10 pain today"    Diagnostic tests  07/12/2019 IMPRESSION:1. No acute osseous injury of the right knee.2. Moderate joint effusion.3. Mild tricompartmental osteoarthritis of the right knee.4. Suggestion of a partial radial tear of the posterior horn of  themedial meniscus but evaluation is limited on CT.    Currently in Pain?  Yes    Pain Score  2     Pain Frequency  Intermittent                        [facility] Adult PT Treatment/Exercise - 09/16/19 0001      Knee/Hip Exercises: Aerobic   Recumbent Bike  L3 x 12min      Knee/Hip Exercises: Machines for Strengthening   Cybex Knee Extension  2 x 12 x 15#    Cybex Knee Flexion  2 x 12 15#    Cybex Leg Press  2 x 12 40#      Knee/Hip Exercises: Seated   Sit to Sand  1 set;15 reps      Manual Therapy   Manual therapy comments  MTPR along R biceps femoris    Joint Mobilization  tibial ER grade III     Soft tissue mobilization  IASTM along R hamstrings                PT Short Term Goals - 08/26/19 1604      PT SHORT TERM GOAL #1   Title  She will be independent with initial HEP    Period  Weeks    Status  New    Target Date  09/16/19      PT SHORT TERM GOAL #2   Title  -    Period  Weeks      PT SHORT TERM GOAL #3   Title  -        PT Long Term Goals - 08/26/19 1605      PT LONG TERM GOAL #1   Title  increase knee extension to </= 3 degrees for function knee ROM required for funcitonal and efficient gait    Time  6    Period  Weeks    Status  New    Target Date  10/07/19      PT LONG TERM GOAL #2   Title  increase gross RLE strength to >/= 4+/5 for hip/knee stability    Time  6    Period  Weeks    Status  New    Target Date  10/07/19      PT LONG TERM GOAL #3   Title  pt to be able walk / stand for >/= 45 min and navigate up/down >/= 12 steps for functional endurance required for ADLs    Baseline  -    Time  6    Period  Weeks    Status  New    Target Date  10/07/19      PT LONG TERM  GOAL #4   Title  increaes FOTO score to </= 38% limited to demo improvement in function    Time  6    Period  Weeks    Status  New    Target Date  10/07/19      PT LONG TERM GOAL #5   Title  pt to be I with all HEP given to maintain and progress  current level of function    Time  6    Period  Weeks    Status  New    Target Date  10/07/19            Plan - 09/16/19 1452    Clinical Impression Statement  pt reported improvement of pain since she had gotten injections, but reports soreness in the bicep femoris. Continued STW along the bicep femoris followed with stretching and strengthening which she responded well to. She no pain and declined modalities end of session.    PT Treatment/Interventions  ADLs/Self Care Home Management;Cryotherapy;Moist Heat;Therapeutic activities;Therapeutic exercise;Balance training;Neuromuscular re-education;Manual techniques;Passive range of motion;Dry needling;Patient/family education;Vasopneumatic Device;Taping    PT Next Visit Plan  (no e-stim or [place] pacemaker), review/ update HEP, knee extension ROM, gait biomechanics, hip/ knee strengthening, ice, cotninue strair training, trial elliptical.    PT Home Exercise Plan  [id number] - hamstring stretch seated and supine, SLR, lateral weight shifting, wall squat, step down       Patient will benefit from skilled therapeutic intervention in order to improve the following deficits and impairments:  Improper body mechanics, Increased muscle spasms, Pain, Postural dysfunction, Decreased strength, Decreased activity tolerance, Decreased endurance, Decreased balance, Decreased range of motion, Abnormal gait  Visit Diagnosis: Chronic pain of right knee  Other abnormalities of gait and mobility  Stiffness of right knee, not elsewhere classified     Problem List Patient Active Problem List   Diagnosis Date Noted  . Paroxysmal atrial fibrillation ([place]) 08/20/2019  . Acute medial meniscus tear of right knee 08/09/2019  . Chondromalacia, right knee 08/09/2019  . SVT (supraventricular tachycardia) ([place]) 11/02/2018  . ICD (implantable cardioverter-defibrillator) in place 11/02/2018  . OSA (obstructive sleep apnea) 05/27/2018  . Acute medial meniscus tear,  left, initial encounter 11/29/2017  . Chondromalacia of left knee 11/29/2017  . Migraines 09/23/2017  . Anxiety 09/23/2017  . Depression 09/23/2017  . Pacemaker 09/23/2017  . Chronic systolic heart failure ([place]) 04/28/2015  . UTI (urinary tract infection) 03/22/2015  . Sepsis ([place]) 03/19/2015  . Acute kidney injury ([place]) 03/19/2015  . Diarrhea 03/18/2015  . CAP (community acquired pneumonia) 03/18/2015  . Nausea vomiting and diarrhea 03/18/2015  . Compression fracture 06/05/2014  . Essential hypertension 03/27/2014  . Chest pain 01/27/2014  . Normal coronary arteries 01/27/2014  . Obesity (BMI 30-39.9)   . ICD- MDT implanted Dec 2013 03/26/2013  . NICM (nonischemic cardiomyopathy) ([place]) 02/21/2013  . GERD (gastroesophageal reflux disease) 04/23/2012  . Asthma in adult, mild persistent, uncomplicated 15/83/0940  . Shortness of breath 04/23/2012  . Syncope 03/01/2012  . Acute on chronic systolic CHF (congestive heart failure) ([place]) 03/01/2012  . Type B WPW syndrome- RFA Dec 2013 03/01/2012   Starr Lake PT, DPT, LAT, ATC  09/16/19  2:58 PM      [place] [facility] [address] [place], [place], [address] Phone: [phone]   Fax:  [phone]  Name: NASTASSJA WITKOP MRN: [id number] Date of Birth: 1945-11-28

## 2019-09-17 ENCOUNTER — Telehealth: Payer: Self-pay | Admitting: *Deleted

## 2019-09-17 NOTE — Telephone Encounter (Signed)
Follow Up:; ° ° °Returning your call. °

## 2019-09-17 NOTE — Telephone Encounter (Signed)
LMOVM (DPR) requesting call back to DC. Direct number and office hours provided.  ICD transmission from 09/16/19 shows a PVT episode in VF zone from 09/10/19 at 12:55. VT self-terminated, defib aborted. Will discuss any symptoms and confirm compliance with cardiac medications.

## 2019-09-17 NOTE — Progress Notes (Signed)
EPIC Encounter for ICM Monitoring  Patient Name: Paula Booker is a 74 y.o. female Date: 09/17/2019 Primary Care Physican: [name], MD Primary [place] Electrophysiologist: Lovena Le 6/9/2021Weight:193 lbs  Clinical Status (27-Aug-2019 to 16-Sep-2019) Treated VT/VF     1 episode  Time in AT/AF <0.1 hr/day (<0.1%)        Spoke with patient.   She spoke with Jodi Geralds, device clinic nurse this AM regarding the VF aborted charge.  She said she feels okay today.  Optivol thoracic impedancenormal but was suggesting possible fluid accumulation from 5/23 - 6/6.  Prescribed:Furosemide40 mgtake2 tablets(80 mg total)by mouth in the morning and an additional 40 mg once daily as needed for fluid retention.  Labs: 06/12/2019 Creatinine 0.81, BUN 18, Potassium 4.2, Sodium 140, GFR 72-83 04/08/2019 Creatinine 1.20, BUN 19, Potassium 4.1, Sodium 137, GFR 45-52 11/11/2018 Creatinine 0.83, BUN 18, Potassium 3.7, Sodium 137, GFR>60 05/04/2018 Creatinine 0.95, BUN 18, Potassium 3.7, Sodium 134, GFR 60  Recommendations:Recommendation to limit salt intake to 2000 mg daily and fluid intake to 64 oz daily.  Encouraged to call if experiencing any fluid symptoms.   Follow-up plan: ICM clinic phone appointment on7/03/2020. 91 day device clinic remote transmission6/29/2021.   Copy of ICM check sent to Dr.Taylor.  3 month ICM trend: 09/16/2019    1 Year ICM trend:       [name], RN 09/17/2019 11:21 AM

## 2019-09-18 ENCOUNTER — Ambulatory Visit: Payer: Medicare HMO | Admitting: Physical Therapy

## 2019-09-18 ENCOUNTER — Other Ambulatory Visit: Payer: Self-pay

## 2019-09-18 VITALS — BP 160/90 | HR 84

## 2019-09-18 DIAGNOSIS — M25661 Stiffness of right knee, not elsewhere classified: Secondary | ICD-10-CM | POA: Diagnosis not present

## 2019-09-18 DIAGNOSIS — R2689 Other abnormalities of gait and mobility: Secondary | ICD-10-CM

## 2019-09-18 DIAGNOSIS — M25561 Pain in right knee: Secondary | ICD-10-CM | POA: Diagnosis not present

## 2019-09-18 DIAGNOSIS — G8929 Other chronic pain: Secondary | ICD-10-CM

## 2019-09-18 NOTE — Telephone Encounter (Signed)
PT called back returning Emily's call. Call was transferred to [facility] at the [facility]

## 2019-09-18 NOTE — Telephone Encounter (Addendum)
Patient reports that at time of documented event on 09/10/19 she was getting out of a chair to walk the kitchen and felt dizzy, + SOB, + CP that was substernal sharp and lasted a  "second", sudden weakness caused her to fall back into her chair. Symptoms lasted a few minutes.  Patient reports she woke up 09/17/19 and just felt total body weakness and has no energy since waking up yesterday morning. She is concerned that she may be in AF, + Eliquis.No missed doses of medication. Remote transmission sent that showed no AF episodes. Optivol has been on a steady increase since 09/13/19. Will forward to Short RN . Dr Lovena Le made aware of event and symptoms and no changes to treatment concerning event noted with symptoms and agreed with plan to forward information to Short RN for Optivol elevation.

## 2019-09-18 NOTE — Therapy (Signed)
[place], [place], [address] Phone: [phone]   Fax:  [phone]  Physical Therapy Treatment  Patient Details  Name: Paula Booker MRN: [id number] Date of Birth: [birth year] Referring Provider (PT): Dorna Leitz MD   Encounter Date: 09/18/2019  PT End of Session - 09/18/19 1414    Visit Number  6    Number of Visits  13    Date for PT Re-Evaluation  10/07/19    Authorization Type  MCR: kx mod at 15th visit    Progress Note Due on Visit  10    PT Start Time  1779    PT Stop Time  1443   shortened session per pt's request.   PT Time Calculation (min)  30 min    Activity Tolerance  Patient tolerated treatment well    Behavior During Therapy  [facility] for tasks assessed/performed       Past Medical History:  Diagnosis Date  . AICD (automatic cardioverter/defibrillator) present   . Anemia    as a child  . Anxiety   . Arthritis    "hands and knees" (03/22/2012)  . Asthma   . Basal cell carcinoma 04/27/2011   mid forehead(MOHS)  . Bursitis   . CHF (congestive heart failure) ([place]) 6/13   pt. denies  . Depression   . Dysrhythmia    WPW  . GERD (gastroesophageal reflux disease)   . Head injury, acute, with loss of consciousness ([place])   . History of hiatal hernia   . Hypertension   . ICD (implantable cardiac defibrillator) in place Dec 2013  . Migraines    migraines   . NICM (nonischemic cardiomyopathy) ([place])   . Obesity (BMI 30-39.9)    negative sleep study 2014  . OSA (obstructive sleep apnea) 05/27/2018   Mild with AHI 6/hr    no cpap  . Pneumonia    hx  . Shortness of breath    "@ any time I can get SOB" (03/22/2012)  . Skin cancer 1999  . Sudden arrhythmia death syndrome   . WPW (Wolff-Parkinson-White syndrome)     Past Surgical History:  Procedure Laterality Date  . CARDIAC CATHETERIZATION  09/18/11   no significant CAD  . CARDIAC DEFIBRILLATOR PLACEMENT  03/22/2012   DDD  .  CHOLECYSTECTOMY  ~ 2003  . IMPLANTABLE CARDIOVERTER DEFIBRILLATOR IMPLANT N/A 03/22/2012   Procedure: IMPLANTABLE CARDIOVERTER DEFIBRILLATOR IMPLANT;  Surgeon: Evans Lance, MD;  Location[facility] C[facility] LAB;  Service: Cardiovascular;  Laterality: N/A;  . KNEE ARTHROSCOPY Left 11/29/2017   Procedure: ARTHROSCOPY LEFT KNEE;  Surgeon: Dorna Leitz, MD;  Location: [facility];  Service: Orthopedics;  Laterality: Left;  . KNEE ARTHROSCOPY Right 08/09/2019   Procedure: ARTHROSCOPY RIGHT KNEE, CHONDROPLASTY,  PARTIAL MEDIAL  MENSICECTOMY;  Surgeon: Dorna Leitz, MD;  Location: [facility];  Service: Orthopedics;  Laterality: Right;  . KYPHOPLASTY N/A 11/27/2013   Procedure: KYPHOPLASTY;  Surgeon: Sinclair Ship, MD;  Location: [place];  Service: Orthopedics;  Laterality: N/A;  T9, T11 kyphoplasty  . KYPHOPLASTY N/A 06/05/2014   Procedure: KYPHOPLASTY;  Surgeon: Sinclair Ship, MD;  Location: [place];  Service: Orthopedics;  Laterality: N/A;  T7 kyphoplasty  . MOHS SURGERY  06/2011   "forehead" (03/22/2012)  . SKIN CANCER EXCISION  1999   "top of my head and my right back shoulder"  . SKIN CANCER EXCISION     back  . SKIN CANCER EXCISION     face  .  SUPRAVENTRICULAR TACHYCARDIA ABLATION  03/22/2012   unable to induce VT/RN report 03/22/2012  . SUPRAVENTRICULAR TACHYCARDIA ABLATION N/A 03/22/2012   Procedure: SUPRAVENTRICULAR TACHYCARDIA ABLATION;  Surgeon: Evans Lance, MD;  Location: [facility] CATH LAB;  Service: Cardiovascular;  Laterality: N/A;  . TONSILLECTOMY AND ADENOIDECTOMY  1950  . TUBAL LIGATION  1978    Vitals:   09/18/19 1414 09/18/19 1435  BP: 122/74 (!) 160/90  Pulse: 86 84  SpO2: 95% 95%    Subjective Assessment - 09/18/19 1414    Subjective  "I am not bad today, some soreness in the knee but not as bad"    Diagnostic tests  07/12/2019 IMPRESSION:1. No acute osseous injury of the right knee.2. Moderate joint effusion.3. Mild tricompartmental osteoarthritis of the right knee.4. Suggestion  of a partial radial tear of the posterior horn of themedial meniscus but evaluation is limited on CT.    Currently in Pain?  Yes    Pain Score  3     Pain Location  Knee    Pain Orientation  Right    Pain Descriptors / Indicators  Aching    Pain Type  Chronic pain         [facility] PT Assessment - 09/18/19 0001      Assessment   Medical Diagnosis  s/p R knee athroscopy / meniscal debridement    Referring Provider (PT)  Dorna Leitz MD    Onset Date/Surgical Date  08/09/19                    [facility] Adult PT Treatment/Exercise - 09/18/19 0001      Knee/Hip Exercises: Stretches   Passive Hamstring Stretch  2 reps;30 seconds;Right      Knee/Hip Exercises: Aerobic   Recumbent Bike  L2 x 3 min       Knee/Hip Exercises: Standing   Hip Abduction  Stengthening;Both;2 sets;Knee straight   12 reps with red theraband   Hip Extension  Stengthening;Both;2 sets;Knee straight   with red theraband and bil HHA on chair            PT Education - 09/18/19 1442    Education Details  updated HEP today for standing hip strengthening, discussed HR/BP today and to reassess once she got home. discussed if she had any abnormal findings or issues to be sure to call 911.    Person(s) Educated  Patient    Methods  Explanation;Verbal cues;Handout    Comprehension  Verbalized understanding;Verbal cues required       PT Short Term Goals - 08/26/19 1604      PT SHORT TERM GOAL #1   Title  She will be independent with initial HEP    Period  Weeks    Status  New    Target Date  09/16/19      PT SHORT TERM GOAL #2   Title  -    Period  Weeks      PT SHORT TERM GOAL #3   Title  -        PT Long Term Goals - 08/26/19 1605      PT LONG TERM GOAL #1   Title  increase knee extension to </= 3 degrees for function knee ROM required for funcitonal and efficient gait    Time  6    Period  Weeks    Status  New    Target Date  10/07/19      PT LONG TERM GOAL #2   Title  increase  gross RLE strength to >/= 4+/5 for hip/knee stability    Time  6    Period  Weeks    Status  New    Target Date  10/07/19      PT LONG TERM GOAL #3   Title  pt to be able walk / stand for >/= 45 min and navigate up/down >/= 12 steps for functional endurance required for ADLs    Baseline  -    Time  6    Period  Weeks    Status  New    Target Date  10/07/19      PT LONG TERM GOAL #4   Title  increaes FOTO score to </= 38% limited to demo improvement in function    Time  6    Period  Weeks    Status  New    Target Date  10/07/19      PT LONG TERM GOAL #5   Title  pt to be I with all HEP given to maintain and progress current level of function    Time  6    Period  Weeks    Status  New    Target Date  10/07/19            Plan - 09/18/19 1426    Clinical Impression Statement  pt arrived reported having increased fatigue today. checked vitals which were WNL at beginning of session and monitored throughout session, end of session she was elevated at 160/90. due to pt feeling more fatigued / exhausted continued strengthening but lower intensity and provided frequent rest breaks. pt noted she felt fine to go home and would continue to monitor her BP/ HR    PT Treatment/Interventions  ADLs/Self Care Home Management;Cryotherapy;Moist Heat;Therapeutic activities;Therapeutic exercise;Balance training;Neuromuscular re-education;Manual techniques;Passive range of motion;Dry needling;Patient/family education;Vasopneumatic Device;Taping    PT Next Visit Plan  (no e-stim or [place] pacemaker), review/ update HEP, knee extension ROM, gait biomechanics, hip/ knee strengthening, ice, cotninue stair training, assess BP/ O2    PT Home Exercise Plan  [id number] - hamstring stretch seated and supine, SLR, lateral weight shifting, wall squat, step down, standing hip abduction/ extension    Consulted and Agree with Plan of Care  Patient       Patient will benefit from skilled therapeutic intervention in  order to improve the following deficits and impairments:  Improper body mechanics, Increased muscle spasms, Pain, Postural dysfunction, Decreased strength, Decreased activity tolerance, Decreased endurance, Decreased balance, Decreased range of motion, Abnormal gait  Visit Diagnosis: Chronic pain of right knee  Other abnormalities of gait and mobility  Stiffness of right knee, not elsewhere classified     Problem List Patient Active Problem List   Diagnosis Date Noted  . Paroxysmal atrial fibrillation ([place]) 08/20/2019  . Acute medial meniscus tear of right knee 08/09/2019  . Chondromalacia, right knee 08/09/2019  . SVT (supraventricular tachycardia) ([place]) 11/02/2018  . ICD (implantable cardioverter-defibrillator) in place 11/02/2018  . OSA (obstructive sleep apnea) 05/27/2018  . Acute medial meniscus tear, left, initial encounter 11/29/2017  . Chondromalacia of left knee 11/29/2017  . Migraines 09/23/2017  . Anxiety 09/23/2017  . Depression 09/23/2017  . Pacemaker 09/23/2017  . Chronic systolic heart failure ([place]) 04/28/2015  . UTI (urinary tract infection) 03/22/2015  . Sepsis ([place]) 03/19/2015  . Acute kidney injury ([place]) 03/19/2015  . Diarrhea 03/18/2015  . CAP (community acquired pneumonia) 03/18/2015  . Nausea vomiting and diarrhea 03/18/2015  . Compression fracture 06/05/2014  .  Essential hypertension 03/27/2014  . Chest pain 01/27/2014  . Normal coronary arteries 01/27/2014  . Obesity (BMI 30-39.9)   . ICD- MDT implanted Dec 2013 03/26/2013  . NICM (nonischemic cardiomyopathy) ([place]) 02/21/2013  . GERD (gastroesophageal reflux disease) 04/23/2012  . Asthma in adult, mild persistent, uncomplicated 37/79/3968  . Shortness of breath 04/23/2012  . Syncope 03/01/2012  . Acute on chronic systolic CHF (congestive heart failure) ([place]) 03/01/2012  . Type B WPW syndrome- RFA Dec 2013 03/01/2012    Starr Lake PT, DPT, LAT, ATC  09/18/19  2:45 PM      [facility] [address] [place], [place], [address] Phone: [phone]   Fax:  [phone]  Name: MAHLANI BERNINGER MRN: [id number] Date of Birth: 1945/12/10

## 2019-09-23 ENCOUNTER — Ambulatory Visit: Payer: Medicare HMO | Admitting: Physical Therapy

## 2019-09-25 ENCOUNTER — Encounter: Payer: Self-pay | Admitting: Physical Therapy

## 2019-09-25 ENCOUNTER — Ambulatory Visit: Payer: Medicare HMO | Admitting: Physical Therapy

## 2019-09-25 ENCOUNTER — Other Ambulatory Visit: Payer: Self-pay

## 2019-09-25 VITALS — BP 109/71 | HR 75

## 2019-09-25 DIAGNOSIS — G8929 Other chronic pain: Secondary | ICD-10-CM

## 2019-09-25 DIAGNOSIS — R2689 Other abnormalities of gait and mobility: Secondary | ICD-10-CM | POA: Diagnosis not present

## 2019-09-25 DIAGNOSIS — M25661 Stiffness of right knee, not elsewhere classified: Secondary | ICD-10-CM | POA: Diagnosis not present

## 2019-09-25 DIAGNOSIS — M25561 Pain in right knee: Secondary | ICD-10-CM | POA: Diagnosis not present

## 2019-09-25 NOTE — Therapy (Signed)
[place], [place], [address] Phone: [phone]   Fax:  [phone]  Physical Therapy Treatment  Patient Details  Name: Paula Booker MRN: [id number] Date of Birth: 12-Nov-1945 Referring Provider (PT): Dorna Leitz MD   Encounter Date: 09/25/2019   PT End of Session - 09/25/19 1434    Visit Number 7    Number of Visits 13    Date for PT Re-Evaluation 10/07/19    Authorization Type MCR: kx mod at 15th visit    Progress Note Due on Visit 10    PT Start Time 7824    PT Stop Time 1504    PT Time Calculation (min) 48 min    Activity Tolerance Patient tolerated treatment well    Behavior During Therapy [facility] for tasks assessed/performed           Past Medical History:  Diagnosis Date  . AICD (automatic cardioverter/defibrillator) present   . Anemia    as a child  . Anxiety   . Arthritis    "hands and knees" (03/22/2012)  . Asthma   . Basal cell carcinoma 04/27/2011   mid forehead(MOHS)  . Bursitis   . CHF (congestive heart failure) ([place]) 6/13   pt. denies  . Depression   . Dysrhythmia    WPW  . GERD (gastroesophageal reflux disease)   . Head injury, acute, with loss of consciousness ([place])   . History of hiatal hernia   . Hypertension   . ICD (implantable cardiac defibrillator) in place Dec 2013  . Migraines    migraines   . NICM (nonischemic cardiomyopathy) ([place])   . Obesity (BMI 30-39.9)    negative sleep study 2014  . OSA (obstructive sleep apnea) 05/27/2018   Mild with AHI 6/hr    no cpap  . Pneumonia    hx  . Shortness of breath    "@ any time I can get SOB" (03/22/2012)  . Skin cancer 1999  . Sudden arrhythmia death syndrome   . WPW (Wolff-Parkinson-White syndrome)     Past Surgical History:  Procedure Laterality Date  . CARDIAC CATHETERIZATION  09/18/11   no significant CAD  . CARDIAC DEFIBRILLATOR PLACEMENT  03/22/2012   DDD  . CHOLECYSTECTOMY  ~ 2003  . IMPLANTABLE  CARDIOVERTER DEFIBRILLATOR IMPLANT N/A 03/22/2012   Procedure: IMPLANTABLE CARDIOVERTER DEFIBRILLATOR IMPLANT;  Surgeon: Evans Lance, MD;  Location: [facility] CATH LAB;  Service: Cardiovascular;  Laterality: N/A;  . KNEE ARTHROSCOPY Left 11/29/2017   Procedure: ARTHROSCOPY LEFT KNEE;  Surgeon: Dorna Leitz, MD;  Location: [facility];  Service: Orthopedics;  Laterality: Left;  . KNEE ARTHROSCOPY Right 08/09/2019   Procedure: ARTHROSCOPY RIGHT KNEE, CHONDROPLASTY,  PARTIAL MEDIAL  MENSICECTOMY;  Surgeon: Dorna Leitz, MD;  Location: [facility];  Service: Orthopedics;  Laterality: Right;  . KYPHOPLASTY N/A 11/27/2013   Procedure: KYPHOPLASTY;  Surgeon: Sinclair Ship, MD;  Location: [place];  Service: Orthopedics;  Laterality: N/A;  T9, T11 kyphoplasty  . KYPHOPLASTY N/A 06/05/2014   Procedure: KYPHOPLASTY;  Surgeon: Sinclair Ship, MD;  Location: [place];  Service: Orthopedics;  Laterality: N/A;  T7 kyphoplasty  . MOHS SURGERY  06/2011   "forehead" (03/22/2012)  . SKIN CANCER EXCISION  1999   "top of my head and my right back shoulder"  . SKIN CANCER EXCISION     back  . SKIN CANCER EXCISION     face  . SUPRAVENTRICULAR TACHYCARDIA ABLATION  03/22/2012   unable to induce  VT/RN report 03/22/2012  . SUPRAVENTRICULAR TACHYCARDIA ABLATION N/A 03/22/2012   Procedure: SUPRAVENTRICULAR TACHYCARDIA ABLATION;  Surgeon: Evans Lance, MD;  Location: [facility] CATH LAB;  Service: Cardiovascular;  Laterality: N/A;  . TONSILLECTOMY AND ADENOIDECTOMY  1950  . TUBAL LIGATION  1978    Vitals:   09/25/19 1435  BP: 109/71  Pulse: 75  SpO2: 96%     Subjective Assessment - 09/25/19 1425    Subjective "I am doing better, I think I can do the stairs better."    Currently in Pain? Yes    Pain Orientation Right    Pain Descriptors / Indicators Aching    Pain Type Surgical pain    Pain Onset More than a month ago    Aggravating Factors  standing/ walking    Pain Relieving Factors stting down, tylenol, ice               [facility] PT Assessment - 09/25/19 0001      Assessment   Medical Diagnosis s/p R knee athroscopy / meniscal debridement    Referring Provider (PT) Dorna Leitz MD    Onset Date/Surgical Date 08/09/19                         [facility] Adult PT Treatment/Exercise - 09/25/19 0001      Knee/Hip Exercises: Stretches   Active Hamstring Stretch 30 seconds;4 reps;Right   seated and supine 2 x ea.     Knee/Hip Exercises: Aerobic   Recumbent Bike L2 x 5 min    with seat lowered for knee flexion     Knee/Hip Exercises: Machines for Strengthening   Cybex Leg Press 2 x 15 40#      Knee/Hip Exercises: Seated   Long Arc Quad 2 sets;20 reps;Strengthening;Right    Long Arc Quad Weight 4 lbs.    Marching 2 sets;20 reps;Weights    Marching Weights 4 lbs.    Sit to Sand 1 set;15 reps;without UE support      Cryotherapy   Number Minutes Cryotherapy 10 Minutes    Cryotherapy Location Knee    Type of Cryotherapy Ice pack      Manual Therapy   Manual therapy comments MTPR along R biceps femoris                    PT Short Term Goals - 08/26/19 1604      PT SHORT TERM GOAL #1   Title She will be independent with initial HEP    Period Weeks    Status New    Target Date 09/16/19      PT SHORT TERM GOAL #2   Title -    Period Weeks      PT SHORT TERM GOAL #3   Title -             PT Long Term Goals - 08/26/19 1605      PT LONG TERM GOAL #1   Title increase knee extension to </= 3 degrees for function knee ROM required for funcitonal and efficient gait    Time 6    Period Weeks    Status New    Target Date 10/07/19      PT LONG TERM GOAL #2   Title increase gross RLE strength to >/= 4+/5 for hip/knee stability    Time 6    Period Weeks    Status New    Target Date 10/07/19  PT LONG TERM GOAL #3   Title pt to be able walk / stand for >/= 45 min and navigate up/down >/= 12 steps for functional endurance required for ADLs    Baseline -    Time  6    Period Weeks    Status New    Target Date 10/07/19      PT LONG TERM GOAL #4   Title increaes FOTO score to </= 38% limited to demo improvement in function    Time 6    Period Weeks    Status New    Target Date 10/07/19      PT LONG TERM GOAL #5   Title pt to be I with all HEP given to maintain and progress current level of function    Time 6    Period Weeks    Status New    Target Date 10/07/19                 Plan - 09/25/19 1507    Clinical Impression Statement continued workign on hip / knee strengthening with emphasis on increased reps to promote endurenace. She responded well to treatment but did requiring increased res. continued monitoring vitals which she noted was within her normal ranges.    PT Treatment/Interventions ADLs/Self Care Home Management;Cryotherapy;Moist Heat;Therapeutic activities;Therapeutic exercise;Balance training;Neuromuscular re-education;Manual techniques;Passive range of motion;Dry needling;Patient/family education;Vasopneumatic Device;Taping    PT Next Visit Plan (no e-stim or [place] pacemaker), review/ update HEP, knee extension ROM, gait biomechanics, hip/ knee strengthening, ice, cotninue stair training, assess BP/ O2    PT Home Exercise Plan [id number] - hamstring stretch seated and supine, SLR, lateral weight shifting, wall squat, step down, standing hip abduction/ extension    Consulted and Agree with Plan of Care Patient           Patient will benefit from skilled therapeutic intervention in order to improve the following deficits and impairments:  Improper body mechanics, Increased muscle spasms, Pain, Postural dysfunction, Decreased strength, Decreased activity tolerance, Decreased endurance, Decreased balance, Decreased range of motion, Abnormal gait  Visit Diagnosis: Chronic pain of right knee  Other abnormalities of gait and mobility  Stiffness of right knee, not elsewhere classified     Problem List Patient Active  Problem List   Diagnosis Date Noted  . Paroxysmal atrial fibrillation ([place]) 08/20/2019  . Acute medial meniscus tear of right knee 08/09/2019  . Chondromalacia, right knee 08/09/2019  . SVT (supraventricular tachycardia) ([place]) 11/02/2018  . ICD (implantable cardioverter-defibrillator) in place 11/02/2018  . OSA (obstructive sleep apnea) 05/27/2018  . Acute medial meniscus tear, left, initial encounter 11/29/2017  . Chondromalacia of left knee 11/29/2017  . Migraines 09/23/2017  . Anxiety 09/23/2017  . Depression 09/23/2017  . Pacemaker 09/23/2017  . Chronic systolic heart failure ([place]) 04/28/2015  . UTI (urinary tract infection) 03/22/2015  . Sepsis ([place]) 03/19/2015  . Acute kidney injury ([place]) 03/19/2015  . Diarrhea 03/18/2015  . CAP (community acquired pneumonia) 03/18/2015  . Nausea vomiting and diarrhea 03/18/2015  . Compression fracture 06/05/2014  . Essential hypertension 03/27/2014  . Chest pain 01/27/2014  . Normal coronary arteries 01/27/2014  . Obesity (BMI 30-39.9)   . ICD- MDT implanted Dec 2013 03/26/2013  . NICM (nonischemic cardiomyopathy) ([place]) 02/21/2013  . GERD (gastroesophageal reflux disease) 04/23/2012  . Asthma in adult, mild persistent, uncomplicated 63/04/6008  . Shortness of breath 04/23/2012  . Syncope 03/01/2012  . Acute on chronic systolic CHF (congestive heart failure) ([place]) 03/01/2012  . Type  B WPW syndrome- RFA Dec 2013 03/01/2012    Starr Lake PT, DPT, LAT, ATC  09/25/19  3:10 PM      [facility] [address] [place], [place], [address] Phone: [phone]   Fax:  [phone]  Name: Paula Booker MRN: [id number] Date of Birth: 15-Aug-1945

## 2019-09-30 ENCOUNTER — Ambulatory Visit: Payer: Medicare HMO | Admitting: Physical Therapy

## 2019-10-02 ENCOUNTER — Ambulatory Visit: Payer: Medicare HMO | Admitting: Physical Therapy

## 2019-10-02 ENCOUNTER — Other Ambulatory Visit: Payer: Self-pay

## 2019-10-02 ENCOUNTER — Encounter: Payer: Self-pay | Admitting: Physical Therapy

## 2019-10-02 DIAGNOSIS — M25661 Stiffness of right knee, not elsewhere classified: Secondary | ICD-10-CM | POA: Diagnosis not present

## 2019-10-02 DIAGNOSIS — G8929 Other chronic pain: Secondary | ICD-10-CM

## 2019-10-02 DIAGNOSIS — M25561 Pain in right knee: Secondary | ICD-10-CM

## 2019-10-02 DIAGNOSIS — R2689 Other abnormalities of gait and mobility: Secondary | ICD-10-CM

## 2019-10-02 NOTE — Therapy (Signed)
Pa[place], [place], [address] Phone: [phone]   Fax:  [phone]  Physical Therapy Treatment / Discharge  Patient Details  Name: Paula Booker MRN: [id number] Date of Birth: 08/30/1945 Referring Provider (PT): Dorna Leitz MD   Encounter Date: 10/02/2019   PT End of Session - 10/02/19 1459    Visit Number 8    Number of Visits 13    Date for PT Re-Evaluation 10/07/19    Authorization Type MCR: kx mod at 15th visit    Progress Note Due on Visit 10    PT Start Time 1500    PT Stop Time 7001    PT Time Calculation (min) 39 min    Activity Tolerance Patient tolerated treatment well    Behavior During Therapy [facility] for tasks assessed/performed           Past Medical History:  Diagnosis Date   AICD (automatic cardioverter/defibrillator) present    Anemia    as a child   Anxiety    Arthritis    "hands and knees" (03/22/2012)   Asthma    Basal cell carcinoma 04/27/2011   mid forehead(MOHS)   Bursitis    CHF (congestive heart failure) ([place]) 6/13   pt. denies   Depression    Dysrhythmia    WPW   GERD (gastroesophageal reflux disease)    Head injury, acute, with loss of consciousness ([place])    History of hiatal hernia    Hypertension    ICD (implantable cardiac defibrillator) in place Dec 2013   Migraines    migraines    NICM (nonischemic cardiomyopathy) ([place])    Obesity (BMI 30-39.9)    negative sleep study 2014   OSA (obstructive sleep apnea) 05/27/2018   Mild with AHI 6/hr    no cpap   Pneumonia    hx   Shortness of breath    "@ any time I can get SOB" (03/22/2012)   Skin cancer 1999   Sudden arrhythmia death syndrome    WPW (Wolff-Parkinson-White syndrome)     Past Surgical History:  Procedure Laterality Date   CARDIAC CATHETERIZATION  09/18/11   no significant CAD   CARDIAC DEFIBRILLATOR PLACEMENT  03/22/2012   DDD   CHOLECYSTECTOMY  ~ 2003    IMPLANTABLE CARDIOVERTER DEFIBRILLATOR IMPLANT N/A 03/22/2012   Procedure: IMPLANTABLE CARDIOVERTER DEFIBRILLATOR IMPLANT;  Surgeon: Evans Lance, MD;  Location: [facility] CATH LAB;  Service: Cardiovascular;  Laterality: N/A;   KNEE ARTHROSCOPY Left 11/29/2017   Procedure: ARTHROSCOPY LEFT KNEE;  Surgeon: Dorna Leitz, MD;  Location: [facility]S;  Service: Orthopedics;  Laterality: Left;   KNEE ARTHROSCOPY Right 08/09/2019   Procedure: ARTHROSCOPY RIGHT KNEE, CHONDROPLASTY,  PARTIAL MEDIAL  MENSICECTOMY;  Surgeon: Dorna Leitz, MD;  Location: [facility];  Service: Orthopedics;  Laterality: Right;   KYPHOPLASTY N/A 11/27/2013   Procedure: KYPHOPLASTY;  Surgeon: Sinclair Ship, MD;  Location: [place];  Service: Orthopedics;  Laterality: N/A;  T9, T11 kyphoplasty   KYPHOPLASTY N/A 06/05/2014   Procedure: KYPHOPLASTY;  Surgeon: Sinclair Ship, MD;  Location: [place];  Service: Orthopedics;  Laterality: N/A;  T7 kyphoplasty   MOHS SURGERY  06/2011   "forehead" (03/22/2012)   SKIN CANCER EXCISION  1999   "top of my head and my right back shoulder"   SKIN CANCER EXCISION     back   SKIN CANCER EXCISION     face   SUPRAVENTRICULAR TACHYCARDIA ABLATION  03/22/2012   unable  Status Achieved      PT LONG TERM GOAL #3   Title pt to be able walk / stand for >/= 45 min and navigate up/down >/= 12 steps for functional endurance required for ADLs    Period Weeks    Status Achieved      PT LONG TERM GOAL #4   Title increaes FOTO score to </= 38% limited to demo improvement in function    Period Weeks    Status Achieved      PT LONG TERM GOAL #5   Title pt to be I with all HEP given to maintain and progress current level of function    Period Weeks    Status Achieved                 Plan - 10/02/19 1533    Clinical Impression Statement [name] has made great progress with physical therapy increasing knee ROM and strength. she does report intermittent sorenss in the back of the knee likely secondary to hamsring stiffness that fluctuates in frequency. She met or partially met all goals today and is able to maintain and progress her level of function independently and will be discharged from PT today.    PT Next Visit Plan d/c today    PT Home Exercise Plan [id number] - hamstring stretch seated and supine, SLR, lateral weight shifting, wall squat, step down, standing hip abduction/ extension, mini lunge    Consulted and Agree with Plan of Care Patient           Patient will benefit from skilled therapeutic intervention in order to improve the following deficits and impairments:  Improper body mechanics, Increased muscle spasms, Pain, Postural dysfunction, Decreased strength, Decreased activity tolerance, Decreased endurance, Decreased balance, Decreased range of motion, Abnormal gait  Visit Diagnosis: Chronic pain of right knee  Other abnormalities of gait and mobility  Stiffness of right knee, not elsewhere classified     Problem List Patient Active Problem List   Diagnosis Date Noted   Paroxysmal atrial fibrillation ([place]) 08/20/2019   Acute medial  meniscus tear of right knee 08/09/2019   Chondromalacia, right knee 08/09/2019   SVT (supraventricular tachycardia) ([place]) 11/02/2018   ICD (implantable cardioverter-defibrillator) in place 11/02/2018   OSA (obstructive sleep apnea) 05/27/2018   Acute medial meniscus tear, left, initial encounter 11/29/2017   Chondromalacia of left knee 11/29/2017   Migraines 09/23/2017   Anxiety 09/23/2017   Depression 09/23/2017   Pacemaker 40/34/7425   Chronic systolic heart failure ([place]) 04/28/2015   UTI (urinary tract infection) 03/22/2015   Sepsis ([place]) 03/19/2015   Acute kidney injury ([place]) 03/19/2015   Diarrhea 03/18/2015   CAP (community acquired pneumonia) 03/18/2015   Nausea vomiting and diarrhea 03/18/2015   Compression fracture 06/05/2014   Essential hypertension 03/27/2014   Chest pain 01/27/2014   Normal coronary arteries 01/27/2014   Obesity (BMI 30-39.9)    ICD- MDT implanted Dec 2013 03/26/2013   NICM (nonischemic cardiomyopathy) ([place]) 02/21/2013   GERD (gastroesophageal reflux disease) 04/23/2012   Asthma in adult, mild persistent, uncomplicated 95/63/8756   Shortness of breath 04/23/2012   Syncope 03/01/2012   Acute on chronic systolic CHF (congestive heart failure) ([place]) 03/01/2012   Type B WPW syndrome- RFA Dec 2013 03/01/2012    Starr Lake 10/02/2019, 3:40 PM  [facility] [address] [place], [place], [address] Phone: [phone]   Fax:  [phone]  Name: Paula Booker MRN: [id number] Date of Birth: 07-04-1945  [place], [place], [address] Phone: [phone]   Fax:  [phone]  Physical Therapy Treatment / Discharge  Patient Details  Name: Paula Booker MRN: [id number] Date of Birth: 08/30/1945 Referring Provider (PT): Dorna Leitz MD   Encounter Date: 10/02/2019   PT End of Session - 10/02/19 1459    Visit Number 8    Number of Visits 13    Date for PT Re-Evaluation 10/07/19    Authorization Type MCR: kx mod at 15th visit    Progress Note Due on Visit 10    PT Start Time 1500    PT Stop Time 7001    PT Time Calculation (min) 39 min    Activity Tolerance Patient tolerated treatment well    Behavior During Therapy [facility] for tasks assessed/performed           Past Medical History:  Diagnosis Date   AICD (automatic cardioverter/defibrillator) present    Anemia    as a child   Anxiety    Arthritis    "hands and knees" (03/22/2012)   Asthma    Basal cell carcinoma 04/27/2011   mid forehead(MOHS)   Bursitis    CHF (congestive heart failure) ([place]) 6/13   pt. denies   Depression    Dysrhythmia    WPW   GERD (gastroesophageal reflux disease)    Head injury, acute, with loss of consciousness ([place])    History of hiatal hernia    Hypertension    ICD (implantable cardiac defibrillator) in place Dec 2013   Migraines    migraines    NICM (nonischemic cardiomyopathy) ([place])    Obesity (BMI 30-39.9)    negative sleep study 2014   OSA (obstructive sleep apnea) 05/27/2018   Mild with AHI 6/hr    no cpap   Pneumonia    hx   Shortness of breath    "@ any time I can get SOB" (03/22/2012)   Skin cancer 1999   Sudden arrhythmia death syndrome    WPW (Wolff-Parkinson-White syndrome)     Past Surgical History:  Procedure Laterality Date   CARDIAC CATHETERIZATION  09/18/11   no significant CAD   CARDIAC DEFIBRILLATOR PLACEMENT  03/22/2012   DDD   CHOLECYSTECTOMY  ~ 2003    IMPLANTABLE CARDIOVERTER DEFIBRILLATOR IMPLANT N/A 03/22/2012   Procedure: IMPLANTABLE CARDIOVERTER DEFIBRILLATOR IMPLANT;  Surgeon: Evans Lance, MD;  Location: [facility] CATH LAB;  Service: Cardiovascular;  Laterality: N/A;   KNEE ARTHROSCOPY Left 11/29/2017   Procedure: ARTHROSCOPY LEFT KNEE;  Surgeon: Dorna Leitz, MD;  Location: [facility];  Service: Orthopedics;  Laterality: Left;   KNEE ARTHROSCOPY Right 08/09/2019   Procedure: ARTHROSCOPY RIGHT KNEE, CHONDROPLASTY,  PARTIAL MEDIAL  MENSICECTOMY;  Surgeon: Dorna Leitz, MD;  Location: [facility];  Service: Orthopedics;  Laterality: Right;   KYPHOPLASTY N/A 11/27/2013   Procedure: KYPHOPLASTY;  Surgeon: Sinclair Ship, MD;  Location: [facility];  Service: Orthopedics;  Laterality: N/A;  T9, T11 kyphoplasty   KYPHOPLASTY N/A 06/05/2014   Procedure: KYPHOPLASTY;  Surgeon: Sinclair Ship, MD;  Location: [place];  Service: Orthopedics;  Laterality: N/A;  T7 kyphoplasty   MOHS SURGERY  06/2011   "forehead" (03/22/2012)   SKIN CANCER EXCISION  1999   "top of my head and my right back shoulder"   SKIN CANCER EXCISION     back   SKIN CANCER EXCISION     face   SUPRAVENTRICULAR TACHYCARDIA ABLATION  03/22/2012   unable  Status Achieved      PT LONG TERM GOAL #3   Title pt to be able walk / stand for >/= 45 min and navigate up/down >/= 12 steps for functional endurance required for ADLs    Period Weeks    Status Achieved      PT LONG TERM GOAL #4   Title increaes FOTO score to </= 38% limited to demo improvement in function    Period Weeks    Status Achieved      PT LONG TERM GOAL #5   Title pt to be I with all HEP given to maintain and progress current level of function    Period Weeks    Status Achieved                 Plan - 10/02/19 1533    Clinical Impression Statement [name] has made great progress with physical therapy increasing knee ROM and strength. she does report intermittent sorenss in the back of the knee likely secondary to hamsring stiffness that fluctuates in frequency. She met or partially met all goals today and is able to maintain and progress her level of function independently and will be discharged from PT today.    PT Next Visit Plan d/c today    PT Home Exercise Plan [id number] - hamstring stretch seated and supine, SLR, lateral weight shifting, wall squat, step down, standing hip abduction/ extension, mini lunge    Consulted and Agree with Plan of Care Patient           Patient will benefit from skilled therapeutic intervention in order to improve the following deficits and impairments:  Improper body mechanics, Increased muscle spasms, Pain, Postural dysfunction, Decreased strength, Decreased activity tolerance, Decreased endurance, Decreased balance, Decreased range of motion, Abnormal gait  Visit Diagnosis: Chronic pain of right knee  Other abnormalities of gait and mobility  Stiffness of right knee, not elsewhere classified     Problem List Patient Active Problem List   Diagnosis Date Noted   Paroxysmal atrial fibrillation ([place]) 08/20/2019   Acute medial  meniscus tear of right knee 08/09/2019   Chondromalacia, right knee 08/09/2019   SVT (supraventricular tachycardia) ([place]) 11/02/2018   ICD (implantable cardioverter-defibrillator) in place 11/02/2018   OSA (obstructive sleep apnea) 05/27/2018   Acute medial meniscus tear, left, initial encounter 11/29/2017   Chondromalacia of left knee 11/29/2017   Migraines 09/23/2017   Anxiety 09/23/2017   Depression 09/23/2017   Pacemaker 40/34/7425   Chronic systolic heart failure ([place]) 04/28/2015   UTI (urinary tract infection) 03/22/2015   Sepsis ([place]) 03/19/2015   Acute kidney injury ([place]) 03/19/2015   Diarrhea 03/18/2015   CAP (community acquired pneumonia) 03/18/2015   Nausea vomiting and diarrhea 03/18/2015   Compression fracture 06/05/2014   Essential hypertension 03/27/2014   Chest pain 01/27/2014   Normal coronary arteries 01/27/2014   Obesity (BMI 30-39.9)    ICD- MDT implanted Dec 2013 03/26/2013   NICM (nonischemic cardiomyopathy) ([place]) 02/21/2013   GERD (gastroesophageal reflux disease) 04/23/2012   Asthma in adult, mild persistent, uncomplicated 95/63/8756   Shortness of breath 04/23/2012   Syncope 03/01/2012   Acute on chronic systolic CHF (congestive heart failure) ([place]) 03/01/2012   Type B WPW syndrome- RFA Dec 2013 03/01/2012    [place] Lake 10/02/2019, 3:40 PM  [facility] [address] [place], [place], [address] Phone: [phone]   Fax:  [phone]  Name: Paula Booker MRN: [id number] Date of Birth: 07-04-1945

## 2019-10-07 ENCOUNTER — Ambulatory Visit: Payer: Medicare HMO | Admitting: Physical Therapy

## 2019-10-08 ENCOUNTER — Ambulatory Visit (INDEPENDENT_AMBULATORY_CARE_PROVIDER_SITE_OTHER): Payer: Medicare HMO | Admitting: *Deleted

## 2019-10-08 DIAGNOSIS — I428 Other cardiomyopathies: Secondary | ICD-10-CM | POA: Diagnosis not present

## 2019-10-08 LAB — CUP PACEART REMOTE DEVICE CHECK
Battery Remaining Longevity: 27 mo
Battery Voltage: 2.89 V
Brady Statistic AP VP Percent: 0.02 %
Brady Statistic AP VS Percent: 13.94 %
Brady Statistic AS VP Percent: 0.03 %
Brady Statistic AS VS Percent: 86.01 %
Brady Statistic RA Percent Paced: 13.43 %
Brady Statistic RV Percent Paced: 0.05 %
Date Time Interrogation Session: 20210629012308
HighPow Impedance: 81 Ohm
Implantable Lead Implant Date: 20131212
Implantable Lead Implant Date: 20131212
Implantable Lead Location: 753859
Implantable Lead Location: 753860
Implantable Lead Model: 5076
Implantable Lead Model: 6935
Implantable Pulse Generator Implant Date: 20131212
Lead Channel Impedance Value: 418 Ohm
Lead Channel Impedance Value: 608 Ohm
Lead Channel Impedance Value: 817 Ohm
Lead Channel Pacing Threshold Amplitude: 0.5 V
Lead Channel Pacing Threshold Amplitude: 0.625 V
Lead Channel Pacing Threshold Pulse Width: 0.4 ms
Lead Channel Pacing Threshold Pulse Width: 0.4 ms
Lead Channel Sensing Intrinsic Amplitude: 3.625 mV
Lead Channel Sensing Intrinsic Amplitude: 3.625 mV
Lead Channel Sensing Intrinsic Amplitude: 9.375 mV
Lead Channel Sensing Intrinsic Amplitude: 9.375 mV
Lead Channel Setting Pacing Amplitude: 2 V
Lead Channel Setting Pacing Amplitude: 2.5 V
Lead Channel Setting Pacing Pulse Width: 0.4 ms
Lead Channel Setting Sensing Sensitivity: 0.3 mV

## 2019-10-09 NOTE — Progress Notes (Signed)
Remote ICD transmission.   

## 2019-10-18 DIAGNOSIS — F419 Anxiety disorder, unspecified: Secondary | ICD-10-CM | POA: Diagnosis not present

## 2019-10-18 DIAGNOSIS — Z0000 Encounter for general adult medical examination without abnormal findings: Secondary | ICD-10-CM | POA: Diagnosis not present

## 2019-10-18 DIAGNOSIS — F331 Major depressive disorder, recurrent, moderate: Secondary | ICD-10-CM | POA: Diagnosis not present

## 2019-10-18 DIAGNOSIS — I10 Essential (primary) hypertension: Secondary | ICD-10-CM | POA: Diagnosis not present

## 2019-10-18 DIAGNOSIS — R5383 Other fatigue: Secondary | ICD-10-CM | POA: Diagnosis not present

## 2019-10-21 ENCOUNTER — Ambulatory Visit (INDEPENDENT_AMBULATORY_CARE_PROVIDER_SITE_OTHER): Payer: Medicare HMO

## 2019-10-21 DIAGNOSIS — Z95810 Presence of automatic (implantable) cardiac defibrillator: Secondary | ICD-10-CM

## 2019-10-21 DIAGNOSIS — I5022 Chronic systolic (congestive) heart failure: Secondary | ICD-10-CM | POA: Diagnosis not present

## 2019-10-23 ENCOUNTER — Ambulatory Visit: Payer: Medicare HMO | Admitting: Physical Therapy

## 2019-10-23 NOTE — Progress Notes (Signed)
EPIC Encounter for ICM Monitoring  Patient Name: Paula Booker is a 74 y.o. female Date: 10/23/2019 Primary Care Physican: [name], MD Primary [place] Electrophysiologist: Lovena Le 7/14/2021Weight:193lbs  Time in AT/AF 0.0 hr/day (0.0%)  Spoke with patient and is doing well.  She reports taking additional 40 mg Furosemide for past 10 days due to swelling of feet.  Swelling resolves overnight.  She is restricting salt and fluid intake as recommended.  Optivol thoracic impedancenormal.  Prescribed:Furosemide40 mgtake2 tablets(80 mg total)by mouth in the morning and an additional 40 mg once daily as needed for fluid retention.  Labs: 07/31/2019 Creatinine 0.84, BUN 18, Potassium 4.4, Sodium 140, GFR >60 06/12/2019 Creatinine 0.81, BUN 18, Potassium 4.2, Sodium 140, GFR 72-83 A complete set of results can be found in Results Review.  Recommendations:Recommendation to limit salt intake to 2000 mg daily and fluid intake to 64 oz daily.  Encouraged to call if experiencing any fluid symptoms.   Follow-up plan: ICM clinic phone appointment on 11/25/2019.   91 day device clinic remote transmission 01/07/2020.    EP/Cardiology Office Visits: Recalls 03/11/2020 with Dr. Lovena Le and 04/16/2020 with Dr Irish Lack.    Copy of ICM check sent to Dr. Lovena Le.   3 month ICM trend: 10/21/2019    1 Year ICM trend:       [name], RN 10/23/2019 9:19 AM

## 2019-11-07 DIAGNOSIS — M25561 Pain in right knee: Secondary | ICD-10-CM | POA: Diagnosis not present

## 2019-11-14 ENCOUNTER — Other Ambulatory Visit: Payer: Self-pay | Admitting: Interventional Cardiology

## 2019-11-25 ENCOUNTER — Ambulatory Visit (INDEPENDENT_AMBULATORY_CARE_PROVIDER_SITE_OTHER): Payer: Medicare HMO

## 2019-11-25 DIAGNOSIS — I5022 Chronic systolic (congestive) heart failure: Secondary | ICD-10-CM | POA: Diagnosis not present

## 2019-11-25 DIAGNOSIS — Z95810 Presence of automatic (implantable) cardiac defibrillator: Secondary | ICD-10-CM

## 2019-11-25 NOTE — Progress Notes (Signed)
EPIC Encounter for ICM Monitoring  Patient Name: Paula Booker is a 74 y.o. female Date: 11/25/2019 Primary Care Physican: [name], MD Primary [place] Electrophysiologist: Lovena Le 7/14/2021Weight:193lbs  Time in AT/AF  0.0 hr/day (0.0%)  Spoke with patient and reports having a migraine headache x 10 days.  She has an appt with PCP tomorrow to discuss the migraines. She has increased fluid intake since having he migraines  Optivol thoracic impedancesuggesting possible fluid accumulation since 11/15/19.  Prescribed:Furosemide40 mgtake2 tablets(80 mg total)by mouth in the morning and an additional 40 mg once daily as needed for fluid retention.  Labs: 07/31/2019 Creatinine 0.84, BUN 18, Potassium 4.4, Sodium 140, GFR >60 06/12/2019 Creatinine 0.81, BUN 18, Potassium 4.2, Sodium 140, GFR 72-83 A complete set of results can be found in Results Review.  Recommendations:Advised to take additional Furosemide 40 mg in the evening x 3 days as prescribed.  Follow-up plan: ICM clinic phone appointment on 12/02/2019 (manual send) to recheck fluid levels.   91 day device clinic remote transmission 01/07/2020.    EP/Cardiology Office Visits: Recalls 03/11/2020 with Dr. Lovena Le and 04/16/2020 with Dr Irish Lack.    Copy of ICM check sent to Dr. Lovena Le and Dr Irish Lack for review.    3 month ICM trend: 11/25/2019    1 Year ICM trend:       [name], RN 11/25/2019 10:33 AM

## 2019-12-02 ENCOUNTER — Ambulatory Visit (INDEPENDENT_AMBULATORY_CARE_PROVIDER_SITE_OTHER): Payer: Medicare HMO

## 2019-12-02 DIAGNOSIS — Z95810 Presence of automatic (implantable) cardiac defibrillator: Secondary | ICD-10-CM

## 2019-12-02 DIAGNOSIS — I5022 Chronic systolic (congestive) heart failure: Secondary | ICD-10-CM

## 2019-12-03 DIAGNOSIS — R519 Headache, unspecified: Secondary | ICD-10-CM | POA: Diagnosis not present

## 2019-12-03 DIAGNOSIS — G8929 Other chronic pain: Secondary | ICD-10-CM | POA: Diagnosis not present

## 2019-12-03 NOTE — Progress Notes (Signed)
EPIC Encounter for ICM Monitoring  Patient Name: Paula Booker is a 74 y.o. female Date: 12/03/2019 Primary Care Physican: [name], MD Primary [place] Electrophysiologist: Lovena Le 7/14/2021Weight:193lbs  Time in AT/AF0.0 hr/day (0.0%)   Spoke with patient and reports no fluid symptoms.  She has been having headaches for 2 months and her doctor thinks she may have an arterial bleed of the brain.   Optivol thoracic impedance returned to normal after taking extra Furosemide x 3 days.  Prescribed:Furosemide40 mgtake2 tablets(80 mg total)by mouth in the morning and an additional 40 mg once daily as needed for fluid retention.  Labs: 07/31/2019 Creatinine0.84, BUN18, Potassium4.4, Sodium140, GFR>60 06/12/2019 Creatinine 0.81, BUN 18, Potassium 4.2, Sodium 140, GFR 72-83 A complete set of results can be found in Results Review.  Recommendations: No changes and encouraged to call if experiencing any fluid symptoms.  Follow-up plan: ICM clinic phone appointment on9/20/2021. 91 day device clinic remote transmission 01/07/2020.   EP/Cardiology Office Visits:Recalls 03/11/2020 with Dr.[place] and 04/16/2020 with Dr Irish Lack.   Copy of ICM check sent to Dr.[place] and Dr Irish Lack for review.   3 month ICM trend: 12/02/2019    1 Year ICM trend:       [name], RN 12/03/2019 3:35 PM

## 2019-12-05 ENCOUNTER — Other Ambulatory Visit: Payer: Self-pay | Admitting: Family Medicine

## 2019-12-05 ENCOUNTER — Ambulatory Visit
Admission: RE | Admit: 2019-12-05 | Discharge: 2019-12-05 | Disposition: A | Discharge status: Home / Self Care | Payer: Medicare HMO | Source: Ambulatory Visit | Attending: Family Medicine | Admitting: Family Medicine

## 2019-12-05 ENCOUNTER — Other Ambulatory Visit: Payer: Self-pay

## 2019-12-05 DIAGNOSIS — R519 Headache, unspecified: Secondary | ICD-10-CM

## 2019-12-17 DIAGNOSIS — R519 Headache, unspecified: Secondary | ICD-10-CM | POA: Diagnosis not present

## 2019-12-17 DIAGNOSIS — G8929 Other chronic pain: Secondary | ICD-10-CM | POA: Diagnosis not present

## 2019-12-17 DIAGNOSIS — Z1211 Encounter for screening for malignant neoplasm of colon: Secondary | ICD-10-CM | POA: Diagnosis not present

## 2019-12-30 ENCOUNTER — Other Ambulatory Visit: Payer: Self-pay

## 2019-12-30 ENCOUNTER — Encounter (HOSPITAL_COMMUNITY): Payer: Self-pay | Admitting: *Deleted

## 2019-12-30 ENCOUNTER — Ambulatory Visit (INDEPENDENT_AMBULATORY_CARE_PROVIDER_SITE_OTHER): Payer: Medicare HMO

## 2019-12-30 ENCOUNTER — Emergency Department (HOSPITAL_COMMUNITY)
Admission: EM | Admit: 2019-12-30 | Discharge: 2019-12-31 | Disposition: A | Discharge status: Home / Self Care | Payer: Medicare HMO | Attending: Emergency Medicine | Admitting: Emergency Medicine

## 2019-12-30 DIAGNOSIS — I5022 Chronic systolic (congestive) heart failure: Secondary | ICD-10-CM | POA: Diagnosis not present

## 2019-12-30 DIAGNOSIS — R1084 Generalized abdominal pain: Secondary | ICD-10-CM | POA: Insufficient documentation

## 2019-12-30 DIAGNOSIS — Z95810 Presence of automatic (implantable) cardiac defibrillator: Secondary | ICD-10-CM | POA: Insufficient documentation

## 2019-12-30 DIAGNOSIS — Z79899 Other long term (current) drug therapy: Secondary | ICD-10-CM | POA: Diagnosis not present

## 2019-12-30 DIAGNOSIS — I10 Essential (primary) hypertension: Secondary | ICD-10-CM | POA: Diagnosis not present

## 2019-12-30 DIAGNOSIS — E860 Dehydration: Secondary | ICD-10-CM | POA: Insufficient documentation

## 2019-12-30 DIAGNOSIS — Z20822 Contact with and (suspected) exposure to covid-19: Secondary | ICD-10-CM | POA: Diagnosis not present

## 2019-12-30 DIAGNOSIS — R112 Nausea with vomiting, unspecified: Secondary | ICD-10-CM | POA: Insufficient documentation

## 2019-12-30 DIAGNOSIS — J4530 Mild persistent asthma, uncomplicated: Secondary | ICD-10-CM | POA: Insufficient documentation

## 2019-12-30 DIAGNOSIS — Z7901 Long term (current) use of anticoagulants: Secondary | ICD-10-CM | POA: Diagnosis not present

## 2019-12-30 DIAGNOSIS — R197 Diarrhea, unspecified: Secondary | ICD-10-CM | POA: Insufficient documentation

## 2019-12-30 DIAGNOSIS — Z951 Presence of aortocoronary bypass graft: Secondary | ICD-10-CM | POA: Diagnosis not present

## 2019-12-30 DIAGNOSIS — R1013 Epigastric pain: Secondary | ICD-10-CM | POA: Diagnosis not present

## 2019-12-30 DIAGNOSIS — K760 Fatty (change of) liver, not elsewhere classified: Secondary | ICD-10-CM | POA: Diagnosis not present

## 2019-12-30 DIAGNOSIS — R634 Abnormal weight loss: Secondary | ICD-10-CM | POA: Diagnosis not present

## 2019-12-30 DIAGNOSIS — I110 Hypertensive heart disease with heart failure: Secondary | ICD-10-CM | POA: Insufficient documentation

## 2019-12-30 DIAGNOSIS — I5023 Acute on chronic systolic (congestive) heart failure: Secondary | ICD-10-CM | POA: Diagnosis not present

## 2019-12-30 DIAGNOSIS — N3001 Acute cystitis with hematuria: Secondary | ICD-10-CM | POA: Diagnosis not present

## 2019-12-30 LAB — COMPREHENSIVE METABOLIC PANEL
ALT: 18 U/L (ref 0–44)
AST: 20 U/L (ref 15–41)
Albumin: 4 g/dL (ref 3.5–5.0)
Alkaline Phosphatase: 77 U/L (ref 38–126)
Anion gap: 12 (ref 5–15)
BUN: 9 mg/dL (ref 8–23)
CO2: 22 mmol/L (ref 22–32)
Calcium: 9.5 mg/dL (ref 8.9–10.3)
Chloride: 101 mmol/L (ref 98–111)
Creatinine, Ser: 0.96 mg/dL (ref 0.44–1.00)
GFR calc Af Amer: >60 mL/min (ref >60)
GFR calc non Af Amer: 59 mL/min — ABNORMAL LOW (ref >60)
Glucose, Bld: 138 mg/dL — ABNORMAL HIGH (ref 70–99)
Potassium: 4.1 mmol/L (ref 3.5–5.1)
Sodium: 135 mmol/L (ref 135–145)
Total Bilirubin: 0.9 mg/dL (ref 0.3–1.2)
Total Protein: 7.2 g/dL (ref 6.5–8.1)

## 2019-12-30 LAB — CBC
HCT: 45.6 % (ref 36.0–46.0)
Hemoglobin: 14.5 g/dL (ref 12.0–15.0)
MCH: 30.5 pg (ref 26.0–34.0)
MCHC: 31.8 g/dL (ref 30.0–36.0)
MCV: 96 fL (ref 80.0–100.0)
Platelets: 257 10*3/uL (ref 150–400)
RBC: 4.75 MIL/uL (ref 3.87–5.11)
RDW: 11.9 % (ref 11.5–15.5)
WBC: 8.1 10*3/uL (ref 4.0–10.5)
nRBC: 0 % (ref 0.0–0.2)

## 2019-12-30 LAB — LIPASE, BLOOD: Lipase: 28 U/L (ref 11–51)

## 2019-12-30 NOTE — ED Triage Notes (Addendum)
Pt reports onset yesterday morning of n/v/d with generalized abd cramping. Sent here due to 6lb weight loss and dehydration. No relief with immodium.

## 2019-12-31 ENCOUNTER — Emergency Department (HOSPITAL_BASED_OUTPATIENT_CLINIC_OR_DEPARTMENT_OTHER): Payer: Medicare HMO

## 2019-12-31 ENCOUNTER — Other Ambulatory Visit: Payer: Self-pay

## 2019-12-31 ENCOUNTER — Encounter (HOSPITAL_BASED_OUTPATIENT_CLINIC_OR_DEPARTMENT_OTHER): Payer: Self-pay | Admitting: *Deleted

## 2019-12-31 ENCOUNTER — Emergency Department (HOSPITAL_BASED_OUTPATIENT_CLINIC_OR_DEPARTMENT_OTHER)
Admission: EM | Admit: 2019-12-31 | Discharge: 2019-12-31 | Disposition: A | Discharge status: Home / Self Care | Payer: Medicare HMO | Source: Home / Self Care | Attending: Emergency Medicine | Admitting: Emergency Medicine

## 2019-12-31 DIAGNOSIS — R112 Nausea with vomiting, unspecified: Secondary | ICD-10-CM | POA: Diagnosis not present

## 2019-12-31 DIAGNOSIS — K760 Fatty (change of) liver, not elsewhere classified: Secondary | ICD-10-CM | POA: Diagnosis not present

## 2019-12-31 DIAGNOSIS — R197 Diarrhea, unspecified: Secondary | ICD-10-CM

## 2019-12-31 DIAGNOSIS — Z20822 Contact with and (suspected) exposure to covid-19: Secondary | ICD-10-CM | POA: Insufficient documentation

## 2019-12-31 DIAGNOSIS — J4530 Mild persistent asthma, uncomplicated: Secondary | ICD-10-CM | POA: Insufficient documentation

## 2019-12-31 DIAGNOSIS — I10 Essential (primary) hypertension: Secondary | ICD-10-CM | POA: Diagnosis not present

## 2019-12-31 DIAGNOSIS — Z95810 Presence of automatic (implantable) cardiac defibrillator: Secondary | ICD-10-CM | POA: Insufficient documentation

## 2019-12-31 DIAGNOSIS — Z85828 Personal history of other malignant neoplasm of skin: Secondary | ICD-10-CM | POA: Insufficient documentation

## 2019-12-31 DIAGNOSIS — I5023 Acute on chronic systolic (congestive) heart failure: Secondary | ICD-10-CM | POA: Insufficient documentation

## 2019-12-31 DIAGNOSIS — N3001 Acute cystitis with hematuria: Secondary | ICD-10-CM | POA: Diagnosis not present

## 2019-12-31 DIAGNOSIS — Z951 Presence of aortocoronary bypass graft: Secondary | ICD-10-CM | POA: Insufficient documentation

## 2019-12-31 DIAGNOSIS — Z7901 Long term (current) use of anticoagulants: Secondary | ICD-10-CM | POA: Insufficient documentation

## 2019-12-31 DIAGNOSIS — R1013 Epigastric pain: Secondary | ICD-10-CM | POA: Insufficient documentation

## 2019-12-31 DIAGNOSIS — J45909 Unspecified asthma, uncomplicated: Secondary | ICD-10-CM | POA: Insufficient documentation

## 2019-12-31 DIAGNOSIS — I110 Hypertensive heart disease with heart failure: Secondary | ICD-10-CM | POA: Insufficient documentation

## 2019-12-31 DIAGNOSIS — Z79899 Other long term (current) drug therapy: Secondary | ICD-10-CM | POA: Insufficient documentation

## 2019-12-31 LAB — COMPREHENSIVE METABOLIC PANEL
ALT: 25 U/L (ref 0–44)
AST: 29 U/L (ref 15–41)
Albumin: 4.1 g/dL (ref 3.5–5.0)
Alkaline Phosphatase: 75 U/L (ref 38–126)
Anion gap: 12 (ref 5–15)
BUN: 21 mg/dL (ref 8–23)
CO2: 24 mmol/L (ref 22–32)
Calcium: 9.4 mg/dL (ref 8.9–10.3)
Chloride: 98 mmol/L (ref 98–111)
Creatinine, Ser: 0.98 mg/dL (ref 0.44–1.00)
GFR calc Af Amer: >60 mL/min (ref >60)
GFR calc non Af Amer: 57 mL/min — ABNORMAL LOW (ref >60)
Glucose, Bld: 128 mg/dL — ABNORMAL HIGH (ref 70–99)
Potassium: 3.5 mmol/L (ref 3.5–5.1)
Sodium: 134 mmol/L — ABNORMAL LOW (ref 135–145)
Total Bilirubin: 0.6 mg/dL (ref 0.3–1.2)
Total Protein: 7.2 g/dL (ref 6.5–8.1)

## 2019-12-31 LAB — CBC WITH DIFFERENTIAL/PLATELET
Abs Immature Granulocytes: 0.01 10*3/uL (ref 0.00–0.07)
Basophils Absolute: 0 10*3/uL (ref 0.0–0.1)
Basophils Relative: 1 %
Eosinophils Absolute: 0.5 10*3/uL (ref 0.0–0.5)
Eosinophils Relative: 7 %
HCT: 42 % (ref 36.0–46.0)
Hemoglobin: 14 g/dL (ref 12.0–15.0)
Immature Granulocytes: 0 %
Lymphocytes Relative: 24 %
Lymphs Abs: 1.8 10*3/uL (ref 0.7–4.0)
MCH: 30.9 pg (ref 26.0–34.0)
MCHC: 33.3 g/dL (ref 30.0–36.0)
MCV: 92.7 fL (ref 80.0–100.0)
Monocytes Absolute: 0.9 10*3/uL (ref 0.1–1.0)
Monocytes Relative: 11 %
Neutro Abs: 4.4 10*3/uL (ref 1.7–7.7)
Neutrophils Relative %: 57 %
Platelets: 250 10*3/uL (ref 150–400)
RBC: 4.53 MIL/uL (ref 3.87–5.11)
RDW: 11.8 % (ref 11.5–15.5)
WBC: 7.6 10*3/uL (ref 4.0–10.5)
nRBC: 0 % (ref 0.0–0.2)

## 2019-12-31 LAB — SARS CORONAVIRUS 2 BY RT PCR (HOSPITAL ORDER, PERFORMED IN ~~LOC~~ HOSPITAL LAB): SARS Coronavirus 2: NEGATIVE

## 2019-12-31 LAB — C DIFFICILE QUICK SCREEN W PCR REFLEX
C Diff antigen: NEGATIVE
C Diff interpretation: NOT DETECTED
C Diff toxin: NEGATIVE

## 2019-12-31 LAB — URINALYSIS, ROUTINE W REFLEX MICROSCOPIC
Bilirubin Urine: NEGATIVE
Glucose, UA: NEGATIVE mg/dL
Ketones, ur: NEGATIVE mg/dL
Nitrite: NEGATIVE
Protein, ur: NEGATIVE mg/dL
Specific Gravity, Urine: 1.01 (ref 1.005–1.030)
pH: 5.5 (ref 5.0–8.0)

## 2019-12-31 LAB — URINALYSIS, MICROSCOPIC (REFLEX)

## 2019-12-31 LAB — LIPASE, BLOOD: Lipase: 33 U/L (ref 11–51)

## 2019-12-31 MED ORDER — HYDROCORTISONE NA SUCCINATE PF 250 MG IJ SOLR
200.0000 mg | Freq: Once | INTRAMUSCULAR | Status: DC
  Filled 2019-12-31: qty 200

## 2019-12-31 MED ORDER — CIPROFLOXACIN HCL 500 MG PO TABS
500.0000 mg | ORAL_TABLET | Freq: Once | ORAL | Status: AC
  Administered 2019-12-31: 500 mg via ORAL
  Filled 2019-12-31: qty 1

## 2019-12-31 MED ORDER — CIPROFLOXACIN HCL 500 MG PO TABS: 500.0000 mg | ORAL_TABLET | Freq: Two times a day (BID) | ORAL | 0 refills | Status: DC

## 2019-12-31 MED ORDER — DIPHENHYDRAMINE HCL 50 MG/ML IJ SOLN: 50.0000 mg | Freq: Once | INTRAMUSCULAR | Status: AC

## 2019-12-31 MED ORDER — HYDROCORTISONE NA SUCCINATE PF 100 MG IJ SOLR
INTRAMUSCULAR | Status: AC
  Administered 2019-12-31: 200 mg
  Filled 2019-12-31: qty 4

## 2019-12-31 MED ORDER — SODIUM CHLORIDE 0.9 % IV BOLUS
1000.0000 mL | Freq: Once | INTRAVENOUS | Status: AC
  Administered 2019-12-31: 1000 mL via INTRAVENOUS

## 2019-12-31 MED ORDER — DIPHENHYDRAMINE HCL 25 MG PO CAPS
50.0000 mg | ORAL_CAPSULE | Freq: Once | ORAL | Status: AC
  Administered 2019-12-31: 50 mg via ORAL
  Filled 2019-12-31: qty 2

## 2019-12-31 MED ORDER — IOHEXOL 300 MG/ML  SOLN
100.0000 mL | Freq: Once | INTRAMUSCULAR | Status: AC | PRN
  Administered 2019-12-31: 100 mL via INTRAVENOUS

## 2019-12-31 NOTE — ED Provider Notes (Signed)
[facility] EMERGENCY DEPARTMENT Provider Note   CSN: [id number] Arrival date & time: 12/31/19  1255     History Chief Complaint  Patient presents with  . Diarrhea    Paula Booker is a 74 y.o. female.  HPI    Pt is a 74 y/o female with a h/o AICD, anxiety, asthma, CHF, depression, GERD, OSA who presents to the ED today for eval of NVD. States sxs started 2 days ago. vomitng has improved but diarrhea persists. States she is having "50" episodes of diarrhea per day. Denies bloody stools, abd pain, fevers, dysuria, frequency.   Denies cough, SOB, chest pain. She has had her COVID vaccine.    Past Medical History:  Diagnosis Date  . AICD (automatic cardioverter/defibrillator) present   . Anemia    as a child  . Anxiety   . Arthritis    "hands and knees" (03/22/2012)  . Asthma   . Basal cell carcinoma 04/27/2011   mid forehead([facility])  . Bursitis   . CHF (congestive heart failure) ([place]) 6/13   pt. denies  . Depression   . Dysrhythmia    WPW  . GERD (gastroesophageal reflux disease)   . Head injury, acute, with loss of consciousness ([place])   . History of hiatal hernia   . Hypertension   . ICD (implantable cardiac defibrillator) in place Dec 2013  . Migraines    migraines   . NICM (nonischemic cardiomyopathy) ([place])   . Obesity (BMI 30-39.9)    negative sleep study 2014  . OSA (obstructive sleep apnea) 05/27/2018   Mild with AHI 6/hr    no cpap  . Pneumonia    hx  . Shortness of breath    "@ any time I can get SOB" (03/22/2012)  . Skin cancer 1999  . Sudden arrhythmia death syndrome   . WPW (Wolff-Parkinson-White syndrome)     Patient Active Problem List   Diagnosis Date Noted  . Paroxysmal atrial fibrillation ([place]) 08/20/2019  . Acute medial meniscus tear of right knee 08/09/2019  . Chondromalacia, right knee 08/09/2019  . SVT (supraventricular tachycardia) ([place]) 11/02/2018  . ICD (implantable cardioverter-defibrillator) in place 11/02/2018  . OSA  (obstructive sleep apnea) 05/27/2018  . Acute medial meniscus tear, left, initial encounter 11/29/2017  . Chondromalacia of left knee 11/29/2017  . Migraines 09/23/2017  . Anxiety 09/23/2017  . Depression 09/23/2017  . Pacemaker 09/23/2017  . Chronic systolic heart failure ([place]) 04/28/2015  . UTI (urinary tract infection) 03/22/2015  . Sepsis ([place]) 03/19/2015  . Acute kidney injury ([place]) 03/19/2015  . Diarrhea 03/18/2015  . CAP (community acquired pneumonia) 03/18/2015  . Nausea vomiting and diarrhea 03/18/2015  . Compression fracture 06/05/2014  . Essential hypertension 03/27/2014  . Chest pain 01/27/2014  . Normal coronary arteries 01/27/2014  . Obesity (BMI 30-39.9)   . ICD- MDT implanted Dec 2013 03/26/2013  . NICM (nonischemic cardiomyopathy) ([place]) 02/21/2013  . GERD (gastroesophageal reflux disease) 04/23/2012  . Asthma in adult, mild persistent, uncomplicated 66/29/4765  . Shortness of breath 04/23/2012  . Syncope 03/01/2012  . Acute on chronic systolic CHF (congestive heart failure) ([place]) 03/01/2012  . Type B WPW syndrome- RFA Dec 2013 03/01/2012    Past Surgical History:  Procedure Laterality Date  . CARDIAC CATHETERIZATION  09/18/11   no significant CAD  . CARDIAC DEFIBRILLATOR PLACEMENT  03/22/2012   DDD  . CHOLECYSTECTOMY  ~ 2003  . IMPLANTABLE CARDIOVERTER DEFIBRILLATOR IMPLANT N/A 03/22/2012   Procedure: IMPLANTABLE CARDIOVERTER DEFIBRILLATOR IMPLANT;  Surgeon: Evans Lance, MD;  Location: [facility] CATH LAB;  Service: Cardiovascular;  Laterality: N/A;  . KNEE ARTHROSCOPY Left 11/29/2017   Procedure: ARTHROSCOPY LEFT KNEE;  Surgeon: Dorna Leitz, MD;  Location: [facility] ORS;  Service: Orthopedics;  Laterality: Left;  . KNEE ARTHROSCOPY Right 08/09/2019   Procedure: ARTHROSCOPY RIGHT KNEE, CHONDROPLASTY,  PARTIAL MEDIAL  MENSICECTOMY;  Surgeon: Dorna Leitz, MD;  Location: [facility] ORS;  Service: Orthopedics;  Laterality: Right;  . KYPHOPLASTY N/A 11/27/2013   Procedure:  KYPHOPLASTY;  Surgeon: Sinclair Ship, MD;  Location: [facility];  Service: Orthopedics;  Laterality: N/A;  T9, T11 kyphoplasty  . KYPHOPLASTY N/A 06/05/2014   Procedure: KYPHOPLASTY;  Surgeon: Sinclair Ship, MD;  Location: [facility];  Service: Orthopedics;  Laterality: N/A;  T7 kyphoplasty  . MOHS SURGERY  06/2011   "forehead" (03/22/2012)  . SKIN CANCER EXCISION  1999   "top of my head and my right back shoulder"  . SKIN CANCER EXCISION     back  . SKIN CANCER EXCISION     face  . SUPRAVENTRICULAR TACHYCARDIA ABLATION  03/22/2012   unable to induce VT/RN report 03/22/2012  . SUPRAVENTRICULAR TACHYCARDIA ABLATION N/A 03/22/2012   Procedure: SUPRAVENTRICULAR TACHYCARDIA ABLATION;  Surgeon: Evans Lance, MD;  Location: [facility] CATH LAB;  Service: Cardiovascular;  Laterality: N/A;  . TONSILLECTOMY AND ADENOIDECTOMY  1950  . TUBAL LIGATION  1978     OB History   No obstetric history on file.     Family History  Problem Relation Age of Onset  . Congestive Heart Failure Brother   . Atrial fibrillation Brother   . Congestive Heart Failure Mother        died 41  . Kidney Stones Mother   . Deafness Mother   . Atrial fibrillation Mother   . Congestive Heart Failure Father   . Deafness Father   . Healthy Sister   . Healthy Sister   . Healthy Sister   . Tuberculosis Paternal Grandfather     Social History   Tobacco Use  . Smoking status: Never Smoker  . Smokeless tobacco: Never Used  Vaping Use  . Vaping Use: Never used  Substance Use Topics  . Alcohol use: Not Currently    Comment: rare  . Drug use: No    Home Medications Prior to Admission medications   Medication Sig Start Date End Date Taking? Authorizing Provider  acetaminophen (TYLENOL) 500 MG tablet Take 500 mg by mouth in the morning and at bedtime.    [provider]  albuterol (PROVENTIL HFA;VENTOLIN HFA) 108 (90 Base) MCG/ACT inhaler Inhale 2 puffs into the lungs every 6 (six) hours as needed for  wheezing. For wheezing. Use 2 puffs 3 times daily x5 days and then every 6 hours as needed. 01/18/18   Mannam, Hart Robinsons, MD  alendronate (FOSAMAX) 70 MG tablet Take 70 mg by mouth every Sunday.     [provider]  apixaban (ELIQUIS) 5 MG TABS tablet Take 1 tablet (5 mg total) by mouth 2 (two) times daily. 08/20/19   Evans Lance, MD  BREO ELLIPTA 200-25 MCG/INH AEPB INHALE 1 PUFF INTO THE LUNGS DAILY 02/25/19   Marshell Garfinkel, MD  carvedilol (COREG) 12.5 MG tablet TAKE 1 TABLET BY MOUTH TWICE DAILY WITH A MEAL 07/19/19   Evans Lance, MD  cetirizine (ZYRTEC) 10 MG tablet Take 10 mg by mouth daily.    [provider]  Cholecalciferol (VITAMIN D3) 5000 units CAPS Take 5,000 Units  by mouth daily.    [provider]  citalopram (CELEXA) 20 MG tablet Take 20 mg by mouth daily.     [provider]  Cyanocobalamin (VITAMIN B-12) 5000 MCG LOZG Take 5,000 mcg by mouth daily.    [provider]  fluticasone (FLONASE) 50 MCG/ACT nasal spray Place 2 sprays into both nostrils daily.    [provider]  furosemide (LASIX) 40 MG tablet TAKE 1 TO 2 TABLETS BY MOUTH AS DIRECTED ONCE DAILY FOR EDEMA, SHORTNESS OF BREATH OR WEIGHT GAIN 11/14/19   Jettie Booze, MD  HYDROcodone-acetaminophen (NORCO) 5-325 MG tablet Take 1-2 tablets by mouth every 6 (six) hours as needed for moderate pain. 08/09/19   Gary Fleet, PA-C  ipratropium-albuterol (DUONEB) 0.5-2.5 (3) MG/3ML SOLN Take 3 mLs by nebulization every 4 (four) hours as needed. 10/09/17   Lauraine Rinne, NP  losartan (COZAAR) 25 MG tablet Take 1 tablet by mouth twice daily 04/25/19   Jettie Booze, MD  Multiple Vitamins-Minerals (CENTRUM SILVER 50+WOMEN) TABS Take 1 tablet by mouth daily with breakfast.     [provider]  pantoprazole (PROTONIX) 40 MG tablet Take 40 mg by mouth daily. 08/29/17   [provider]  spironolactone (ALDACTONE) 25 MG tablet Take 1 tablet by mouth once  daily 05/21/19   Jettie Booze, MD    Allergies    Ivp dye [iodinated diagnostic agents], Penicillins, Sulfonamide derivatives, Tessalon [benzonatate], Sulfa antibiotics, Rosuvastatin calcium, and Onion  Review of Systems   Review of Systems  Constitutional: Negative for chills and fever.  HENT: Negative for ear pain and sore throat.   Eyes: Negative for visual disturbance.  Respiratory: Negative for cough and shortness of breath.   Cardiovascular: Negative for chest pain.  Gastrointestinal: Positive for diarrhea, nausea and vomiting. Negative for abdominal pain.  Genitourinary: Negative for dysuria and hematuria.  Musculoskeletal: Negative for back pain.  Skin: Negative for rash.  Neurological: Negative for headaches.  All other systems reviewed and are negative.   Physical Exam Updated Vital Signs BP (!) 100/47 (BP Location: Right Arm)   Pulse 65   Temp 97.8 F (36.6 C) (Oral)   Resp 12   Ht 5\' 4"  (1.626 m)   Wt 88.9 kg   SpO2 100%   BMI 33.64 kg/m   Physical Exam Vitals and nursing note reviewed.  Constitutional:      General: She is not in acute distress.    Appearance: She is well-developed.  HENT:     Head: Normocephalic and atraumatic.  Eyes:     Conjunctiva/sclera: Conjunctivae normal.  Cardiovascular:     Rate and Rhythm: Normal rate and regular rhythm.     Heart sounds: Normal heart sounds. No murmur heard.   Pulmonary:     Effort: Pulmonary effort is normal. No respiratory distress.     Breath sounds: Normal breath sounds. No wheezing, rhonchi or rales.  Abdominal:     General: Bowel sounds are normal.     Palpations: Abdomen is soft.     Tenderness: There is abdominal tenderness (epigastric, LUQ, LLQ, and RLQ TTP). There is no guarding or rebound.  Musculoskeletal:     Cervical back: Neck supple.  Skin:    General: Skin is warm and dry.  Neurological:     Mental Status: She is alert.     ED Results / Procedures / Treatments   Labs (all  labs ordered are listed, but only abnormal results are displayed) Labs Reviewed  C DIFFICILE QUICK SCREEN W PCR REFLEX  SARS CORONAVIRUS 2 BY RT PCR (HOSPITAL ORDER, PERFORMED IN [facility])  CBC WITH DIFFERENTIAL/PLATELET  COMPREHENSIVE METABOLIC PANEL  LIPASE, BLOOD  URINALYSIS, ROUTINE W REFLEX MICROSCOPIC    EKG EKG Interpretation  Date/Time:  Tuesday December 31 2019 17:21:03 EDT Ventricular Rate:  83 PR Interval:    QRS Duration: 149 QT Interval:  439 QTC Calculation: 516 R Axis:   -34 Text Interpretation: Sinus rhythm Ventricular premature complex Left bundle branch block No significant change since last tracing Confirmed by Calvert Cantor [phone]) on 12/31/2019 5:34:01 PM   Radiology No results found.  Procedures Procedures (including critical care time)  Medications Ordered in ED Medications  hydrocortisone sodium succinate (SOLU-CORTEF) injection 200 mg (200 mg Intravenous Not Given 12/31/19 1746)  diphenhydrAMINE (BENADRYL) capsule 50 mg (50 mg Oral Given 12/31/19 1745)    Or  diphenhydrAMINE (BENADRYL) injection 50 mg ( Intravenous See Alternative 12/31/19 1745)  sodium chloride 0.9 % bolus 1,000 mL (1,000 mLs Intravenous New Bag/Given 12/31/19 1743)  hydrocortisone sodium succinate (SOLU-CORTEF) 100 MG injection (200 mg  Given 12/31/19 1741)    ED Course  I have reviewed the triage vital signs and the nursing notes.  Pertinent labs & imaging results that were available during my care of the patient were reviewed by me and considered in my medical decision making (see chart for details).    MDM Rules/Calculators/A&P                           74 year old female presenting for evaluation of nausea vomiting and diarrhea that has been ongoing for the last several days.  On exam she does have diffuse abdominal tenderness.  She is afebrile not tachycardic but does have borderline blood pressures here in the emergency department.    She attempted to  go to the ED yesterday and had labs drawn which were overall reassuring however she left prior to being seen and returns today with continued symptoms.  Work-up initiated with labs, abdominal CT scan and IV fluids.  At shift change, care transitioned to Margarita Mail, PA-C with plan to f/u on labs and imaging.   Final Clinical Impression(s) / ED Diagnoses Final diagnoses:  Diarrhea, unspecified type    Rx / DC Orders ED Discharge Orders    None       Bishop Dublin 12/31/19 1804    Truddie Hidden, MD 12/31/19 [phone]

## 2019-12-31 NOTE — Progress Notes (Signed)
EPIC Encounter for ICM Monitoring  Patient Name: Paula Booker is a 74 y.o. female Date: 12/31/2019 Primary Care Physican: [name], MD Primary [place] Electrophysiologist: Lovena Le 7/14/2021Weight:193lbs  Time in AT/AF0.0 hr/day (0.0%)   Transmission reviewed.  Optivol thoracic impedance suggesting normal fluid levels.  Prescribed:Furosemide40 mgtake2 tablets(80 mg total)by mouth in the morning and an additional 40 mg once daily as needed for fluid retention.  Labs: 12/30/2019 Creatinine 0.96, BUN 9,   Potassium 4.1, Sodium 135, GFR 59->60 07/31/2019 Creatinine0.84, BUN18, Potassium4.4, Sodium140, GFR>60 06/12/2019 Creatinine 0.81, BUN 18, Potassium 4.2, Sodium 140, GFR 72-83 A complete set of results can be found in Results Review.  Recommendations: No changes.  Follow-up plan: ICM clinic phone appointment on10/25/2021. 91 day device clinic remote transmission 01/07/2020.   EP/Cardiology Office Visits:Recalls 03/11/2020 with Dr.Taylor and 04/16/2020 with Dr Irish Lack.   Copy of ICM check sent to Dr.Taylor   3 month ICM trend: 12/30/2019    1 Year ICM trend:       [name], RN 12/31/2019 2:51 PM

## 2019-12-31 NOTE — ED Provider Notes (Signed)
5:07 PM BP (!) 100/47 (BP Location: Right Arm)   Pulse 65   Temp 97.8 F (36.6 C) (Oral)   Resp 12   Ht 5\' 4"  (1.626 m)   Wt 88.9 kg   SpO2 100%   BMI 33.64 kg/m  Patient taken in signout from PA catheter at shift handoff.  She has had diarrhea for the past 2 days.  Stating that she is having over 50 episodes daily of brown watery stools.  Denies blood, melena or hematochezia.  She had initial vomiting which has improved.  She is also complaining of abdominal pain.  Patient has a contrast allergy and CT is pending.  She has an extensive cardiac history and she is placed on cardiac monitoring and EKG.   38:59 PM 74 year old female here with diarrhea.  I reviewed the patient's labs which shows negative Covid test, urinalysis appears infected, CBC without elevated white blood cell count.  CMP shows mildly elevated blood glucose, gastrointestinal stool panel and C. difficile screen are pending.  I personally reviewed the patient's CT abdomen pelvis with contrast which shows no evidence of colitis or other abnormalities.  Given the fact that she has no elevated white blood Cell count or evidence of colitis I have extraordinarily low suspicion that the patient has C. difficile colitis.  Patient will be discharged with Cipro for treatment of her UTI.  She may use immodium as I doubt Cdiff. Appears otherwise appropriate for dc with close f/u and strict return precautions.    Margarita Mail, PA-C 01/01/20 1054    Truddie Hidden, MD 01/01/20 1331

## 2019-12-31 NOTE — Discharge Instructions (Addendum)
Your CT scan was completely normal. You have diarrhea and a urinary tract infection.  Your C-diff and Stool pathogen panel are pending but without evidence of colitis, they are likely to be negative. You may take imodium over the counter for your diarrhea.   Please follow up with your primary doctor within the next 5-7 days.  If you do not have a primary care provider, information for a healthcare clinic has been provided for you to make arrangements for follow up care. Please return to the ER sooner if you have any new or worsening symptoms, or if you have any of the following symptoms:  Abdominal pain that does not go away.  You have a fever.  You keep throwing up (vomiting).  The pain is felt only in portions of the abdomen. Pain in the right side could possibly be appendicitis. In an adult, pain in the left lower portion of the abdomen could be colitis or diverticulitis.  You pass bloody or black tarry stools.  There is bright red blood in the stool.  The constipation stays for more than 4 days.  There is belly (abdominal) or rectal pain.  You do not seem to be getting better.  You have any questions or concerns.

## 2019-12-31 NOTE — ED Notes (Signed)
Spoke to on call pharmacist who verified that pt next benadryl is due by 2100; Pharmacist said he would adjust the order

## 2019-12-31 NOTE — ED Triage Notes (Signed)
Diarrhea x 2 days. See went to [facility] ER yesterday and had blood drawn but left before being seen by the EDP. She talked to her MD today and he wanted here to come to the ED to have her electrolytes checked.

## 2020-01-01 LAB — GASTROINTESTINAL PANEL BY PCR, STOOL (REPLACES STOOL CULTURE)

## 2020-01-02 ENCOUNTER — Ambulatory Visit: Payer: Medicare HMO | Admitting: Pulmonary Disease

## 2020-01-07 ENCOUNTER — Ambulatory Visit (INDEPENDENT_AMBULATORY_CARE_PROVIDER_SITE_OTHER): Payer: Medicare HMO | Admitting: Emergency Medicine

## 2020-01-07 DIAGNOSIS — I428 Other cardiomyopathies: Secondary | ICD-10-CM

## 2020-01-07 LAB — CUP PACEART REMOTE DEVICE CHECK
Battery Remaining Longevity: 29 mo
Battery Voltage: 2.87 V
Brady Statistic AP VP Percent: 0 %
Brady Statistic AP VS Percent: 1.13 %
Brady Statistic AS VP Percent: 0.03 %
Brady Statistic AS VS Percent: 98.83 %
Brady Statistic RA Percent Paced: 1.13 %
Brady Statistic RV Percent Paced: 0.04 %
Date Time Interrogation Session: 20210928012508
HighPow Impedance: 90 Ohm
Implantable Lead Implant Date: 20131212
Implantable Lead Implant Date: 20131212
Implantable Lead Location: 753859
Implantable Lead Location: 753860
Implantable Lead Model: 5076
Implantable Lead Model: 6935
Implantable Pulse Generator Implant Date: 20131212
Lead Channel Impedance Value: 475 Ohm
Lead Channel Impedance Value: 646 Ohm
Lead Channel Impedance Value: 836 Ohm
Lead Channel Pacing Threshold Amplitude: 0.5 V
Lead Channel Pacing Threshold Amplitude: 0.625 V
Lead Channel Pacing Threshold Pulse Width: 0.4 ms
Lead Channel Pacing Threshold Pulse Width: 0.4 ms
Lead Channel Sensing Intrinsic Amplitude: 3.875 mV
Lead Channel Sensing Intrinsic Amplitude: 3.875 mV
Lead Channel Sensing Intrinsic Amplitude: 9 mV
Lead Channel Sensing Intrinsic Amplitude: 9 mV
Lead Channel Setting Pacing Amplitude: 2 V
Lead Channel Setting Pacing Amplitude: 2.5 V
Lead Channel Setting Pacing Pulse Width: 0.4 ms
Lead Channel Setting Sensing Sensitivity: 0.3 mV

## 2020-01-09 NOTE — Progress Notes (Signed)
Remote ICD transmission.   

## 2020-01-14 ENCOUNTER — Ambulatory Visit (INDEPENDENT_AMBULATORY_CARE_PROVIDER_SITE_OTHER): Payer: Medicare HMO | Admitting: Pulmonary Disease

## 2020-01-14 ENCOUNTER — Other Ambulatory Visit: Payer: Self-pay

## 2020-01-14 ENCOUNTER — Encounter: Payer: Self-pay | Admitting: Pulmonary Disease

## 2020-01-14 VITALS — BP 110/74 | HR 82 | Temp 98.4°F | Ht 64.0 in | Wt 201.8 lb

## 2020-01-14 DIAGNOSIS — J4540 Moderate persistent asthma, uncomplicated: Secondary | ICD-10-CM | POA: Diagnosis not present

## 2020-01-14 DIAGNOSIS — R0602 Shortness of breath: Secondary | ICD-10-CM | POA: Diagnosis not present

## 2020-01-14 MED ORDER — ALBUTEROL SULFATE HFA 108 (90 BASE) MCG/ACT IN AERS: 2.0000 | INHALATION_SPRAY | Freq: Four times a day (QID) | RESPIRATORY_TRACT | 5 refills | Status: DC | PRN

## 2020-01-14 MED ORDER — BREO ELLIPTA 200-25 MCG/INH IN AEPB: INHALATION_SPRAY | RESPIRATORY_TRACT | 2 refills | Status: DC

## 2020-01-14 NOTE — Patient Instructions (Signed)
We will start you on an inhaler called Breo 100 We will resume the albuterol rescue inhaler  Follow-up in 3 months.

## 2020-01-14 NOTE — Addendum Note (Signed)
Addended by: Elton Sin on: 01/14/2020 11:43 AM   Modules accepted: Orders

## 2020-01-14 NOTE — Progress Notes (Signed)
Paula Booker    [id number]    Aug 22, 1945  Primary Care Physician:[name], MD  Referring Physician: [name], [place] [address]  Chief complaint:  Follow up for  Mild persistent asthma  HPI: 73 Y/O with history of nonischemic cardiomyopathy, syncope, WPW syndrome status post ICD implant, asthma.  She is complains of dyspnea since 2001 but reports worsening symptoms over the past 3 months. She has been evaluated in the pulmonary office in 2014 with normal PFTs. She was given [facility] inhaler for her asthma but does not appear to have improved her symptoms.  Her chief complaint is dyspnea with activity and at rest, cough with no sputum production, occasional wheezing, denies any fevers, chills, hemoptysis. As noted by Dr. Sheryn Bison, PCP she has been active at home remodeling her kitchen cabinets without any issue. She recently got a Fitbit fitness tracker and is currently doing 2500 steps per day. She has plans to increase it to 10,000 steps per day. She has also lost 6 pounds recently and plans on losing 6 more pounds. Current meds include albuterol inhaler which she uses rarely.  Hospitalized in June 2019 for dyspnea, asthma exacerbation.   Pets: 3 cats. No dogs, birds, exotic animals Occupation: Retired. She used to work as a Engineer, manufacturing systems, Pharmacist, hospital, Optometrist Exposures: No mold issues, no exposure to asbestos. No hot tubs, Jacuzzi at home Smoking history: Never smoker  Interval history: Lost to follow-up since 2019 as she stayed at home during the COVID-19 pandemic. She ran out of Breo inhaler in 2020. Notes worsening dyspnea with occasional wheezing since then. No nocturnal awakenings, denies any cough, sputum production, fevers, chills  Outpatient Encounter Medications as of 01/14/2020  Medication Sig  . acetaminophen (TYLENOL) 500 MG tablet Take 500 mg by mouth in the morning and at bedtime.  Marland Kitchen albuterol (PROVENTIL  HFA;VENTOLIN HFA) 108 (90 Base) MCG/ACT inhaler Inhale 2 puffs into the lungs every 6 (six) hours as needed for wheezing. For wheezing. Use 2 puffs 3 times daily x5 days and then every 6 hours as needed.  Marland Kitchen alendronate (FOSAMAX) 70 MG tablet Take 70 mg by mouth every Sunday.   Marland Kitchen apixaban (ELIQUIS) 5 MG TABS tablet Take 1 tablet (5 mg total) by mouth 2 (two) times daily.  . carvedilol (COREG) 12.5 MG tablet TAKE 1 TABLET BY MOUTH TWICE DAILY WITH A MEAL  . cetirizine (ZYRTEC) 10 MG tablet Take 10 mg by mouth daily.  . Cholecalciferol (VITAMIN D3) 5000 units CAPS Take 5,000 Units by mouth daily.  . ciprofloxacin (CIPRO) 500 MG tablet Take 1 tablet (500 mg total) by mouth 2 (two) times daily.  . citalopram (CELEXA) 20 MG tablet Take 20 mg by mouth daily.   . Cyanocobalamin (VITAMIN B-12) 5000 MCG LOZG Take 5,000 mcg by mouth daily.  . fluticasone (FLONASE) 50 MCG/ACT nasal spray Place 2 sprays into both nostrils daily.  . furosemide (LASIX) 40 MG tablet TAKE 1 TO 2 TABLETS BY MOUTH AS DIRECTED ONCE DAILY FOR EDEMA, SHORTNESS OF BREATH OR WEIGHT GAIN  . ipratropium-albuterol (DUONEB) 0.5-2.5 (3) MG/3ML SOLN Take 3 mLs by nebulization every 4 (four) hours as needed.  Marland Kitchen losartan (COZAAR) 25 MG tablet Take 1 tablet by mouth twice daily  . Multiple Vitamins-Minerals (CENTRUM SILVER 50+WOMEN) TABS Take 1 tablet by mouth daily with breakfast.   . pantoprazole (PROTONIX) 40 MG tablet Take 40 mg by mouth daily.  Marland Kitchen  spironolactone (ALDACTONE) 25 MG tablet Take 1 tablet by mouth once daily  . BREO ELLIPTA 200-25 MCG/INH AEPB INHALE 1 PUFF INTO THE LUNGS DAILY (Patient not taking: Reported on 01/14/2020)  . HYDROcodone-acetaminophen (NORCO) 5-325 MG tablet Take 1-2 tablets by mouth every 6 (six) hours as needed for moderate pain.   No facility-administered encounter medications on file as of 01/14/2020.   Physical Exam: Blood pressure 110/74, pulse 82, temperature 98.4 F (36.9 C), temperature source Skin,  height 5\' 4"  (1.626 m), weight 201 lb 12.8 oz (91.5 kg), SpO2 98 %. Gen:      No acute distress HEENT:  EOMI, sclera anicteric Neck:     No masses; no thyromegaly Lungs:    Clear to auscultation bilaterally; normal respiratory effort CV:         Regular rate and rhythm; no murmurs Abd:      + bowel sounds; soft, non-tender; no palpable masses, no distension Ext:    No edema; adequate peripheral perfusion Skin:      Warm and dry; no rash Neuro: alert and oriented x 3 Psych: normal mood and affect  Data Reviewed: Imaging CT chest 05/22/14-mild bilateral groundglass opacities consistent with edema CT abdomen, pelvis 03/18/15-left basilar consolidation Chest x-ray 07/27/2017- no acute cardiopulmonary abnormality. Chest x-ray 09/23/2017- no acute cardiopulmonary abnormality. I reviewed the images personally.  PFTs FENO 09/20/16- 19 FENO 11/13/2017-15  Spirometry 05/10/12 FVC 2.15 [75%), FEV1 1.64 [72%), F/F 76  PFTs 10/17/12 FVC 2.24 [80%), FEV1 1.75 [70%), F/F 78, TLC 88%, DLCO 93% Normal study  PFTs 01/26/17 FVC 2.21 [77%], FEV1 1.65 [77%], F/F 75, TLC 86%, DLCO 86% Small airways disease with significant bronchodilator response  Peak inspiratory flow 09/11/2017-60  Labs Blood allergy profile 01/26/2017-IgE 13, RAST panel shows sensitivity to dust mite CBC 01/26/2017-WBC 8.5, eos 4.7%, absolute eosinophil count 400  Assessment:  Mild persistent asthma Even though FENO is low she has elevated peripheral eos and the PFTs show significant reduction in mid flow rates with bronchodilator response.  She has been off Breo 200 for the past year and has noted increasing dyspnea, wheezing We will start her back on Breo but I do not think she will require the higher dose  Suspected dyspnea is contributed by obesity and deconditioning.  Encouraged weight loss and exercise. Declined to do pulmonary rehab due to costs involved.   GERD On Prilosec.  Follows with [facility]  maintenance 03/19/15-Pneumovax Up-to-date with flu and COVID-19 vaccine  Plan/Recommendations:  - Resume Breo 100, albuterol PRN. - Continue weight loss and exercise.  Marshell Garfinkel MD [facility] 01/14/2020, 11:26 AM  CC: [name], MD

## 2020-01-21 DIAGNOSIS — M25562 Pain in left knee: Secondary | ICD-10-CM | POA: Diagnosis not present

## 2020-01-21 DIAGNOSIS — M25561 Pain in right knee: Secondary | ICD-10-CM | POA: Diagnosis not present

## 2020-01-21 DIAGNOSIS — M170 Bilateral primary osteoarthritis of knee: Secondary | ICD-10-CM | POA: Diagnosis not present

## 2020-01-29 DIAGNOSIS — L237 Allergic contact dermatitis due to plants, except food: Secondary | ICD-10-CM | POA: Diagnosis not present

## 2020-02-03 ENCOUNTER — Ambulatory Visit (INDEPENDENT_AMBULATORY_CARE_PROVIDER_SITE_OTHER): Payer: Medicare HMO

## 2020-02-03 DIAGNOSIS — Z95810 Presence of automatic (implantable) cardiac defibrillator: Secondary | ICD-10-CM | POA: Diagnosis not present

## 2020-02-03 DIAGNOSIS — I5022 Chronic systolic (congestive) heart failure: Secondary | ICD-10-CM | POA: Diagnosis not present

## 2020-02-07 ENCOUNTER — Telehealth: Payer: Self-pay

## 2020-02-07 NOTE — Telephone Encounter (Signed)
Remote ICM transmission received.  Attempted call to patient regarding ICM remote transmission and left detailed message per DPR.  Advised to return call for any fluid symptoms or questions. Next ICM remote transmission scheduled 03/09/2020.

## 2020-02-07 NOTE — Progress Notes (Signed)
EPIC Encounter for ICM Monitoring  Patient Name: Paula Booker is a 74 y.o. female Date: 02/07/2020 Primary Care Physican: [name], MD Primary [place] Electrophysiologist: Lovena Le 7/14/2021Weight:193lbs  Time in AT/AF0.0 hr/day (0.0%)   Attempted call to patient and unable to reach.  Left detailed message per DPR regarding transmission. Transmission reviewed.   Optivol thoracic impedance suggesting normal fluid levels.  Prescribed:Furosemide40 mgtake2 tablets(80 mg total)by mouth in the morning and an additional 40 mg once daily as needed for fluid retention.  Labs: 12/30/2019 Creatinine 0.96, BUN 9,   Potassium 4.1, Sodium 135, GFR 59->60 07/31/2019 Creatinine0.84, BUN18, Potassium4.4, Sodium140, GFR>60 06/12/2019 Creatinine 0.81, BUN 18, Potassium 4.2, Sodium 140, GFR 72-83 A complete set of results can be found in Results Review.  Recommendations:Left voice mail with ICM number and encouraged to call if experiencing any fluid symptoms.  Follow-up plan: ICM clinic phone appointment on11/29/2021. 91 day device clinic remote transmission 04/07/2020.   EP/Cardiology Office Visits: 03/17/2020 with Dr.Taylor and 04/16/2020 with Dr Irish Lack.   Copy of ICM check sent to Dr.Taylor   3 month ICM trend: 02/03/2020    1 Year ICM trend:       [name], RN 02/07/2020 9:46 AM

## 2020-03-09 ENCOUNTER — Ambulatory Visit (INDEPENDENT_AMBULATORY_CARE_PROVIDER_SITE_OTHER): Payer: Medicare HMO

## 2020-03-09 DIAGNOSIS — I5022 Chronic systolic (congestive) heart failure: Secondary | ICD-10-CM

## 2020-03-09 DIAGNOSIS — Z95810 Presence of automatic (implantable) cardiac defibrillator: Secondary | ICD-10-CM | POA: Diagnosis not present

## 2020-03-11 NOTE — Progress Notes (Signed)
EPIC Encounter for ICM Monitoring  Patient Name: Paula Booker is a 74 y.o. female Date: 03/11/2020 Primary Care Physican: [name], MD Primary [place] Electrophysiologist: Lovena Le 12/1/2021Weight:197lbs  Time in AT/AF0.0 hr/day (0.0%)   Spoke with patient and reports feeling well at this time.  Denies fluid symptoms.    Optivol thoracic impedancesuggestingnormalfluid levels.  Prescribed:Furosemide40 mgtake2 tablets(80 mg total)by mouth in the morning and an additional 40 mg once daily as needed for fluid retention.  Labs: 12/30/2019 Creatinine 0.96, BUN 9, Potassium 4.1, Sodium 135, GFR 59->60 07/31/2019 Creatinine0.84, BUN18, Potassium4.4, Sodium140, GFR>60 06/12/2019 Creatinine 0.81, BUN 18, Potassium 4.2, Sodium 140, GFR 72-83 A complete set of results can be found in Results Review.  Recommendations:No changes and encouraged to call if experiencing any fluid symptoms.  Follow-up plan: ICM clinic phone appointment on1/06/2020. 91 day device clinic remote transmission 04/07/2020.   EP/Cardiology Office Visits: 03/17/2020 with Dr.Taylor and 04/16/2020 with Dr Irish Lack.   Copy of ICM check sent to Dr.Taylor    3 month ICM trend: 03/09/2020    1 Year ICM trend:       [name], RN 03/11/2020 12:11 PM

## 2020-03-17 ENCOUNTER — Encounter: Payer: Self-pay | Admitting: Internal Medicine

## 2020-03-17 ENCOUNTER — Other Ambulatory Visit: Payer: Self-pay

## 2020-03-17 ENCOUNTER — Ambulatory Visit (INDEPENDENT_AMBULATORY_CARE_PROVIDER_SITE_OTHER): Payer: Medicare HMO | Admitting: Internal Medicine

## 2020-03-17 VITALS — BP 110/72 | HR 75 | Ht 64.0 in | Wt 205.8 lb

## 2020-03-17 DIAGNOSIS — Z95810 Presence of automatic (implantable) cardiac defibrillator: Secondary | ICD-10-CM | POA: Diagnosis not present

## 2020-03-17 DIAGNOSIS — I5022 Chronic systolic (congestive) heart failure: Secondary | ICD-10-CM

## 2020-03-17 DIAGNOSIS — I480 Paroxysmal atrial fibrillation: Secondary | ICD-10-CM | POA: Diagnosis not present

## 2020-03-17 NOTE — Progress Notes (Signed)
HPI Mrs. Gedeon returns today for ongoing evaluation. She has a h/o ICM, chronic systolic heart failure, syncope, and a WPW pattern on her ECG. She underwent EP study which demonstrated no inducible arrhythmias. She underwent ICD insertion. She has a h/o generalized weakness.  She has had trouble with insomnia. Her energy level has improved.  She admits to dietary indiscretion.  Allergies  Allergen Reactions  . Ivp Dye [Iodinated Diagnostic Agents] Hives and Other (See Comments)  . Penicillins Anaphylaxis    Has patient had a PCN reaction causing immediate rash, facial/tongue/throat swelling, SOB or lightheadedness with hypotension: Yes Has patient had a PCN reaction causing severe rash involving mucus membranes or skin necrosis: No Has patient had a PCN reaction that required hospitalization No Has patient had a PCN reaction occurring within the last 10 years: No If all of the above answers are "NO", then may proceed with Cephalosporin use.  . Sulfonamide Derivatives Other (See Comments)    Migraines  . Tessalon [Benzonatate] Hives and Rash    Severe rash  . Sulfa Antibiotics Other (See Comments)    Migraines  . Rosuvastatin Calcium Other (See Comments)    Reaction not recalled by the patient  . Onion Other (See Comments)    Raw onions cause migraines     Current Outpatient Medications  Medication Sig Dispense Refill  . acetaminophen (TYLENOL) 500 MG tablet Take 500 mg by mouth in the morning and at bedtime.    Marland Kitchen albuterol (VENTOLIN HFA) 108 (90 Base) MCG/ACT inhaler Inhale 2 puffs into the lungs every 6 (six) hours as needed for wheezing. For wheezing. Use 2 puffs 3 times daily x5 days and then every 6 hours as needed. 18 g 5  . alendronate (FOSAMAX) 70 MG tablet Take 70 mg by mouth every Sunday.     Marland Kitchen apixaban (ELIQUIS) 5 MG TABS tablet Take 1 tablet (5 mg total) by mouth 2 (two) times daily. 60 tablet 11  . carvedilol (COREG) 12.5 MG tablet TAKE 1 TABLET BY MOUTH TWICE  DAILY WITH A MEAL 180 tablet 3  . cetirizine (ZYRTEC) 10 MG tablet Take 10 mg by mouth daily.    . Cholecalciferol (VITAMIN D3) 5000 units CAPS Take 5,000 Units by mouth daily.    . citalopram (CELEXA) 20 MG tablet Take 20 mg by mouth daily.     . Cyanocobalamin (VITAMIN B-12) 5000 MCG LOZG Take 5,000 mcg by mouth daily.    . fluticasone (FLONASE) 50 MCG/ACT nasal spray Place 2 sprays into both nostrils daily.    . fluticasone furoate-vilanterol (BREO ELLIPTA) 200-25 MCG/INH AEPB INHALE 1 PUFF INTO THE LUNGS DAILY 60 each 2  . furosemide (LASIX) 40 MG tablet TAKE 1 TO 2 TABLETS BY MOUTH AS DIRECTED ONCE DAILY FOR EDEMA, SHORTNESS OF BREATH OR WEIGHT GAIN 180 tablet 1  . ipratropium-albuterol (DUONEB) 0.5-2.5 (3) MG/3ML SOLN Take 3 mLs by nebulization every 4 (four) hours as needed. 360 mL 1  . losartan (COZAAR) 25 MG tablet Take 1 tablet by mouth twice daily 180 tablet 3  . Multiple Vitamins-Minerals (CENTRUM SILVER 50+WOMEN) TABS Take 1 tablet by mouth daily with breakfast.     . pantoprazole (PROTONIX) 40 MG tablet Take 40 mg by mouth daily.  0  . spironolactone (ALDACTONE) 25 MG tablet Take 1 tablet by mouth once daily 90 tablet 3   No current facility-administered medications for this visit.     Past Medical History:  Diagnosis Date  . AICD (  automatic cardioverter/defibrillator) present   . Anemia    as a child  . Anxiety   . Arthritis    "hands and knees" (03/22/2012)  . Asthma   . Basal cell carcinoma 04/27/2011   mid forehead(MOHS)  . Bursitis   . CHF (congestive heart failure) ([place]) 6/13   pt. denies  . Depression   . Dysrhythmia    WPW  . GERD (gastroesophageal reflux disease)   . Head injury, acute, with loss of consciousness ([place])   . History of hiatal hernia   . Hypertension   . ICD (implantable cardiac defibrillator) in place Dec 2013  . Migraines    migraines   . NICM (nonischemic cardiomyopathy) ([place])   . Obesity (BMI 30-39.9)    negative sleep study 2014   . OSA (obstructive sleep apnea) 05/27/2018   Mild with AHI 6/hr    no cpap  . Pneumonia    hx  . Shortness of breath    "@ any time I can get SOB" (03/22/2012)  . Skin cancer 1999  . Sudden arrhythmia death syndrome   . WPW (Wolff-Parkinson-White syndrome)     ROS:   All systems reviewed and negative except as noted in the HPI.   Past Surgical History:  Procedure Laterality Date  . CARDIAC CATHETERIZATION  09/18/11   no significant CAD  . CARDIAC DEFIBRILLATOR PLACEMENT  03/22/2012   DDD  . CHOLECYSTECTOMY  ~ 2003  . IMPLANTABLE CARDIOVERTER DEFIBRILLATOR IMPLANT N/A 03/22/2012   Procedure: IMPLANTABLE CARDIOVERTER DEFIBRILLATOR IMPLANT;  Surgeon: Evans Lance, MD;  Location: [facility] CATH LAB;  Service: Cardiovascular;  Laterality: N/A;  . KNEE ARTHROSCOPY Left 11/29/2017   Procedure: ARTHROSCOPY LEFT KNEE;  Surgeon: Dorna Leitz, MD;  Location: [facility] ORS;  Service: Orthopedics;  Laterality: Left;  . KNEE ARTHROSCOPY Right 08/09/2019   Procedure: ARTHROSCOPY RIGHT KNEE, CHONDROPLASTY,  PARTIAL MEDIAL  MENSICECTOMY;  Surgeon: Dorna Leitz, MD;  Location: [facility] ORS;  Service: Orthopedics;  Laterality: Right;  . KYPHOPLASTY N/A 11/27/2013   Procedure: KYPHOPLASTY;  Surgeon: Sinclair Ship, MD;  Location: [place];  Service: Orthopedics;  Laterality: N/A;  T9, T11 kyphoplasty  . KYPHOPLASTY N/A 06/05/2014   Procedure: KYPHOPLASTY;  Surgeon: Sinclair Ship, MD;  Location: [place];  Service: Orthopedics;  Laterality: N/A;  T7 kyphoplasty  . MOHS SURGERY  06/2011   "forehead" (03/22/2012)  . SKIN CANCER EXCISION  1999   "top of my head and my right back shoulder"  . SKIN CANCER EXCISION     back  . SKIN CANCER EXCISION     face  . SUPRAVENTRICULAR TACHYCARDIA ABLATION  03/22/2012   unable to induce VT/RN report 03/22/2012  . SUPRAVENTRICULAR TACHYCARDIA ABLATION N/A 03/22/2012   Procedure: SUPRAVENTRICULAR TACHYCARDIA ABLATION;  Surgeon: Evans Lance, MD;  Location[facility] CATH LAB;   Service: Cardiovascular;  Laterality: N/A;  . TONSILLECTOMY AND ADENOIDECTOMY  1950  . TUBAL LIGATION  1978     Family History  Problem Relation Age of Onset  . Congestive Heart Failure Brother   . Atrial fibrillation Brother   . Congestive Heart Failure Mother        died 44  . Kidney Stones Mother   . Deafness Mother   . Atrial fibrillation Mother   . Congestive Heart Failure Father   . Deafness Father   . Healthy Sister   . Healthy Sister   . Healthy Sister   . Tuberculosis Paternal Grandfather      Social History  Socioeconomic History  . Marital status: Divorced    Spouse name: Not on file  . Number of children: 3  . Years of education: Not on file  . Highest education level: Not on file  Occupational History  . Occupation: retired    Comment: Optometrist  Tobacco Use  . Smoking status: Never Smoker  . Smokeless tobacco: Never Used  Vaping Use  . Vaping Use: Never used  Substance and Sexual Activity  . Alcohol use: Not Currently    Comment: rare  . Drug use: No  . Sexual activity: Not Currently  Other Topics Concern  . Not on file  Social History Narrative   Divorced, 3 children, 9 grandchildren   Social Determinants of Health   Financial Resource Strain:   . Difficulty of Paying Living Expenses: Not on file  Food Insecurity:   . Worried About Charity fundraiser in the Last Year: Not on file  . Ran Out of Food in the Last Year: Not on file  Transportation Needs:   . Lack of Transportation (Medical): Not on file  . Lack of Transportation (Non-Medical): Not on file  Physical Activity:   . Days of Exercise per Week: Not on file  . Minutes of Exercise per Session: Not on file  Stress:   . Feeling of Stress : Not on file  Social Connections:   . Frequency of Communication with Friends and Family: Not on file  . Frequency of Social Gatherings with Friends and Family: Not on file  . Attends Religious Services: Not on file  . Active Member of Clubs  or Organizations: Not on file  . Attends Archivist Meetings: Not on file  . Marital Status: Not on file  Intimate Partner Violence:   . Fear of Current or Ex-Partner: Not on file  . Emotionally Abused: Not on file  . Physically Abused: Not on file  . Sexually Abused: Not on file     There were no vitals taken for this visit.  Physical Exam:  Well appearing NAD HEENT: Unremarkable Neck:  No JVD, no thyromegally Lymphatics:  No adenopathy Back:  No CVA tenderness Lungs:  Clear with no wheezes HEART:  Regular rate rhythm, no murmurs, no rubs, no clicks Abd:  soft, positive bowel sounds, no organomegally, no rebound, no guarding Ext:  2 plus pulses, no edema, no cyanosis, no clubbing Skin:  No rashes no nodules Neuro:  CN II through XII intact, motor grossly intact  DEVICE  Normal device function.  See PaceArt for details.   Assess/Plan: 1. Chronic systolic heart failure -her symptoms are class 2A. He will continue his current meds. 2. ICD - her DDD ICD is working normally and she has just over 2 years of battery longevity. 3. Obesity - I encouraged her to lose weight.  4. HTN - her bp is well controlled. She will maintain a low sodium diet.  Carleene Overlie Latera Mclin,MD

## 2020-03-17 NOTE — Patient Instructions (Signed)
Medication Instructions:  Your physician recommends that you continue on your current medications as directed. Please refer to the Current Medication list given to you today.  Labwork: None ordered.  Testing/Procedures: None ordered.  Follow-Up: Your physician wants you to follow-up in: one year with Dr. Lovena Le.   You will receive a reminder letter in the mail two months in advance. If you don't receive a letter, please call our office to schedule the follow-up appointment.  Remote monitoring is used to monitor your ICD from home. This monitoring reduces the number of office visits required to check your device to one time per year. It allows [place] to keep an eye on the functioning of your device to ensure it is working properly. You are scheduled for a device check from home on 04/07/2020. You may send your transmission at any time that day. If you have a wireless device, the transmission will be sent automatically. After your physician reviews your transmission, you will receive a postcard with your next transmission date.  Any Other Special Instructions Will Be Listed Below (If Applicable).  If you need a refill on your cardiac medications before your next appointment, please call your pharmacy.

## 2020-04-07 ENCOUNTER — Ambulatory Visit (INDEPENDENT_AMBULATORY_CARE_PROVIDER_SITE_OTHER): Payer: Medicare HMO

## 2020-04-07 DIAGNOSIS — I428 Other cardiomyopathies: Secondary | ICD-10-CM

## 2020-04-07 LAB — CUP PACEART REMOTE DEVICE CHECK
Battery Remaining Longevity: 24 mo
Battery Voltage: 2.86 V
Brady Statistic AP VP Percent: 0.01 %
Brady Statistic AP VS Percent: 2.75 %
Brady Statistic AS VP Percent: 0.03 %
Brady Statistic AS VS Percent: 97.21 %
Brady Statistic RA Percent Paced: 2.74 %
Brady Statistic RV Percent Paced: 0.03 %
Date Time Interrogation Session: 20211228012307
HighPow Impedance: 93 Ohm
Implantable Lead Implant Date: 20131212
Implantable Lead Implant Date: 20131212
Implantable Lead Location: 753859
Implantable Lead Location: 753860
Implantable Lead Model: 5076
Implantable Lead Model: 6935
Implantable Pulse Generator Implant Date: 20131212
Lead Channel Impedance Value: 513 Ohm
Lead Channel Impedance Value: 665 Ohm
Lead Channel Impedance Value: 817 Ohm
Lead Channel Pacing Threshold Amplitude: 0.5 V
Lead Channel Pacing Threshold Amplitude: 0.75 V
Lead Channel Pacing Threshold Pulse Width: 0.4 ms
Lead Channel Pacing Threshold Pulse Width: 0.4 ms
Lead Channel Sensing Intrinsic Amplitude: 3.125 mV
Lead Channel Sensing Intrinsic Amplitude: 3.125 mV
Lead Channel Sensing Intrinsic Amplitude: 7.375 mV
Lead Channel Sensing Intrinsic Amplitude: 7.375 mV
Lead Channel Setting Pacing Amplitude: 2 V
Lead Channel Setting Pacing Amplitude: 2.5 V
Lead Channel Setting Pacing Pulse Width: 0.4 ms
Lead Channel Setting Sensing Sensitivity: 0.3 mV

## 2020-04-13 ENCOUNTER — Ambulatory Visit (INDEPENDENT_AMBULATORY_CARE_PROVIDER_SITE_OTHER): Payer: Medicare HMO

## 2020-04-13 DIAGNOSIS — Z95810 Presence of automatic (implantable) cardiac defibrillator: Secondary | ICD-10-CM

## 2020-04-13 DIAGNOSIS — I5022 Chronic systolic (congestive) heart failure: Secondary | ICD-10-CM | POA: Diagnosis not present

## 2020-04-14 NOTE — Progress Notes (Signed)
EPIC Encounter for ICM Monitoring  Patient Name: Paula Booker is a 75 y.o. female Date: 04/14/2020 Primary Care Physican: Henrine Screws, MD Primary Cardiologist:[place] Electrophysiologist: Ladona Ridgel 12/1/2021Weight:197lbs  Time in AT/AF0.0 hr/day (0.0%)   Spoke with patient and reports feeling well at this time.  Denies fluid symptoms.    Optivol thoracic impedancesuggestingnormalfluid levels.  Prescribed:Furosemide40 mgtake2 tablets(80 mg total)by mouth in the morning and an additional 40 mg once daily as needed for fluid retention.  Labs: 12/30/2019 Creatinine 0.96, BUN 9, Potassium 4.1, Sodium 135, GFR 59->60 07/31/2019 Creatinine0.84, BUN18, Potassium4.4, Sodium140, GFR>60 06/12/2019 Creatinine 0.81, BUN 18, Potassium 4.2, Sodium 140, GFR 72-83 A complete set of results can be found in Results Review.  Recommendations:No changes and encouraged to call if experiencing any fluid symptoms.  Follow-up plan: ICM clinic phone appointment on2/10/2020. 91 day device clinic remote transmission3/29/2022.   EP/Cardiology Office Visits:04/16/2020 with Dr Eldridge Dace.   Copy of ICM check sent to Dr.Taylor   3 month ICM trend: 04/13/2020.    1 Year ICM trend:       Karie Soda, RN 04/14/2020 2:32 PM

## 2020-04-15 NOTE — Progress Notes (Addendum)
Cardiology Office Note   Date:  04/16/2020   ID:  Paula Booker, DOB 24-Jan-1946, MRN [id number]  PCP:  [name], MD    No chief complaint on file.  NICM  Wt Readings from Last 3 Encounters:  04/16/20 208 lb 12.8 oz (94.7 kg)  03/17/20 205 lb 12.8 oz (93.4 kg)  01/14/20 201 lb 12.8 oz (91.5 kg)       History of Present Illness: Paula Booker is a 75 y.o. female    who has a nonischemic cardiomyopathy. She has had WPW diagnosed on ECG as well by Dr. Lovena Le. She has had ICD implant and EP study for WPW with no inducible arrhythmia.  Hospitalized in Dec 2016 for sepsis, UTI, pneumonia. SHe was in the hospital for about a week.  She has routine EP f/u on AICD.She typically takes her Lasix regularly. If she skips it, she gets a call about fluid retention.  In the past, she had an asthma attack. She was treated with steroids and nebulizer.  Sleep study ordered in 03/2018 for daytime somnolence.   She does try to avoid processed food.   She banged her head on a shelf while standing up and lost consciousness, on 04/04/2019.  She called her PMD.  Eventually had a head CT- which showed no bleed in her head.  SHe did have a concussion.   She had an abscessed tooth that was treated with ABx in 2020.   She has had this problem before but stated she has not taken antibiotics before teeth pulling in the past and has done well. Pulled her tooth.  Diagnosed with AFib in 5/21. Started on Eliquis 5 BID by Dr. Lovena Le for stroke prevention.   She has cortisone injections.  Since the last visit, she as tried to avoid COVID.  Walking limited by knee pain and lack of motivation.  She limps with walking, but does have access to the [facility].      Past Medical History:  Diagnosis Date  . AICD (automatic cardioverter/defibrillator) present   . Anemia    as a child  . Anxiety   . Arthritis    "hands and knees" (03/22/2012)  . Asthma   . Basal cell carcinoma  04/27/2011   mid forehead(MOHS)  . Bursitis   . CHF (congestive heart failure) ([place]) 6/13   pt. denies  . Depression   . Dysrhythmia    WPW  . GERD (gastroesophageal reflux disease)   . Head injury, acute, with loss of consciousness ([place])   . History of hiatal hernia   . Hypertension   . ICD (implantable cardiac defibrillator) in place Dec 2013  . Migraines    migraines   . NICM (nonischemic cardiomyopathy) ([place])   . Obesity (BMI 30-39.9)    negative sleep study 2014  . OSA (obstructive sleep apnea) 05/27/2018   Mild with AHI 6/hr    no cpap  . Pneumonia    hx  . Shortness of breath    "@ any time I can get SOB" (03/22/2012)  . Skin cancer 1999  . Sudden arrhythmia death syndrome   . WPW (Wolff-Parkinson-White syndrome)     Past Surgical History:  Procedure Laterality Date  . CARDIAC CATHETERIZATION  09/18/11   no significant CAD  . CARDIAC DEFIBRILLATOR PLACEMENT  03/22/2012   DDD  . CHOLECYSTECTOMY  ~ 2003  . IMPLANTABLE CARDIOVERTER DEFIBRILLATOR IMPLANT N/A 03/22/2012   Procedure: IMPLANTABLE CARDIOVERTER DEFIBRILLATOR IMPLANT;  Surgeon: Evans Lance,  MD;  Location: [facility] CATH LAB;  Service: Cardiovascular;  Laterality: N/A;  . KNEE ARTHROSCOPY Left 11/29/2017   Procedure: ARTHROSCOPY LEFT KNEE;  Surgeon: Jodi Geralds, MD;  Location: [facility];  Service: Orthopedics;  Laterality: Left;  . KNEE ARTHROSCOPY Right 08/09/2019   Procedure: ARTHROSCOPY RIGHT KNEE, CHONDROPLASTY,  PARTIAL MEDIAL  MENSICECTOMY;  Surgeon: Jodi Geralds, MD;  Location: [facility] ORS;  Service: Orthopedics;  Laterality: Right;  . KYPHOPLASTY N/A 11/27/2013   Procedure: KYPHOPLASTY;  Surgeon: Emilee Hero, MD;  Location: [facility] OR;  Service: Orthopedics;  Laterality: N/A;  T9, T11 kyphoplasty  . KYPHOPLASTY N/A 06/05/2014   Procedure: KYPHOPLASTY;  Surgeon: Emilee Hero, MD;  Location: [facility] OR;  Service: Orthopedics;  Laterality: N/A;  T7 kyphoplasty  . MOHS SURGERY  06/2011   "forehead" (03/22/2012)   . SKIN CANCER EXCISION  1999   "top of my head and my right back shoulder"  . SKIN CANCER EXCISION     back  . SKIN CANCER EXCISION     face  . SUPRAVENTRICULAR TACHYCARDIA ABLATION  03/22/2012   unable to induce VT/RN report 03/22/2012  . SUPRAVENTRICULAR TACHYCARDIA ABLATION N/A 03/22/2012   Procedure: SUPRAVENTRICULAR TACHYCARDIA ABLATION;  Surgeon: Marinus Maw, MD;  Location: [facility] CATH LAB;  Service: Cardiovascular;  Laterality: N/A;  . TONSILLECTOMY AND ADENOIDECTOMY  1950  . TUBAL LIGATION  1978     Current Outpatient Medications  Medication Sig Dispense Refill  . acetaminophen (TYLENOL) 500 MG tablet Take 500 mg by mouth in the morning and at bedtime.    Marland Kitchen albuterol (VENTOLIN HFA) 108 (90 Base) MCG/ACT inhaler Inhale 2 puffs into the lungs every 6 (six) hours as needed for wheezing. For wheezing. Use 2 puffs 3 times daily x5 days and then every 6 hours as needed. 18 g 5  . alendronate (FOSAMAX) 70 MG tablet Take 70 mg by mouth every Sunday.     Marland Kitchen apixaban (ELIQUIS) 5 MG TABS tablet Take 1 tablet (5 mg total) by mouth 2 (two) times daily. 60 tablet 11  . carvedilol (COREG) 12.5 MG tablet TAKE 1 TABLET BY MOUTH TWICE DAILY WITH A MEAL 180 tablet 3  . cetirizine (ZYRTEC) 10 MG tablet Take 10 mg by mouth daily.    . Cholecalciferol (VITAMIN D3) 5000 units CAPS Take 5,000 Units by mouth daily.    . citalopram (CELEXA) 20 MG tablet Take 20 mg by mouth daily.     . Cyanocobalamin (VITAMIN B-12) 5000 MCG LOZG Take 5,000 mcg by mouth daily.    . fluticasone (FLONASE) 50 MCG/ACT nasal spray Place 2 sprays into both nostrils daily.    . fluticasone furoate-vilanterol (BREO ELLIPTA) 200-25 MCG/INH AEPB INHALE 1 PUFF INTO THE LUNGS DAILY 60 each 2  . furosemide (LASIX) 40 MG tablet TAKE 1 TO 2 TABLETS BY MOUTH AS DIRECTED ONCE DAILY FOR EDEMA, SHORTNESS OF BREATH OR WEIGHT GAIN 180 tablet 1  . ipratropium-albuterol (DUONEB) 0.5-2.5 (3) MG/3ML SOLN Take 3 mLs by nebulization every 4 (four)  hours as needed. 360 mL 1  . losartan (COZAAR) 25 MG tablet Take 1 tablet by mouth twice daily 180 tablet 3  . Multiple Vitamins-Minerals (CENTRUM SILVER 50+WOMEN) TABS Take 1 tablet by mouth daily with breakfast.     . pantoprazole (PROTONIX) 40 MG tablet Take 40 mg by mouth daily.  0  . spironolactone (ALDACTONE) 25 MG tablet Take 1 tablet by mouth once daily 90 tablet 3   No current facility-administered medications for this visit.  Allergies:   Ivp dye [iodinated diagnostic agents], Penicillins, Sulfonamide derivatives, Tessalon [benzonatate], Sulfa antibiotics, Rosuvastatin calcium, and Onion    Social History:  The patient  reports that she has never smoked. She has never used smokeless tobacco. She reports previous alcohol use. She reports that she does not use drugs.   Family History:  The patient's family history includes Atrial fibrillation in her brother and mother; Congestive Heart Failure in her brother, father, and mother; Deafness in her father and mother; Healthy in her sister, sister, and sister; Kidney Stones in her mother; Tuberculosis in her paternal grandfather.    ROS:  Please see the history of present illness.   Otherwise, review of systems are positive for knee pain.   All other systems are reviewed and negative.    PHYSICAL EXAM: VS:  BP 100/60   Pulse 97   Ht 5\' 4"  (1.626 m)   Wt 208 lb 12.8 oz (94.7 kg)   SpO2 96%   BMI 35.84 kg/m  , BMI Body mass index is 35.84 kg/m. GEN: Well nourished, well developed, in no acute distress  HEENT: normal  Neck: no JVD, carotid bruits, or masses Cardiac: RRR; no murmurs, rubs, or gallops,no edema  Respiratory:  clear to auscultation bilaterally, normal work of breathing GI: soft, nontender, nondistended, + BS MS: no deformity or atrophy  Skin: warm and dry, no rash Neuro:  Strength and sensation are intact Psych: euthymic mood, full affect   EKG:   The ekg ordered today demonstrates NSR, LBBB PVC   Recent  Labs: 06/12/2019: NT-Pro BNP 329 12/31/2019: ALT 25; BUN 21; Creatinine, Ser 0.98; Hemoglobin 14.0; Platelets 250; Potassium 3.5; Sodium 134   Lipid Panel    Component Value Date/Time   CHOL 186 04/26/2019 0845   TRIG 122 04/26/2019 0845   HDL 43 04/26/2019 0845   CHOLHDL 4.3 04/26/2019 0845   LDLCALC 121 (H) 04/26/2019 0845     Other studies Reviewed: Additional studies/ records that were reviewed today with results demonstrating: labs reviewed .   ASSESSMENT AND PLAN:  1. NICM: Chronic systolic heart failure.  Feeling fatigued today, may be related to the second shingles shot she got yesterday.  Try to increase exercise.  Knee limits her mostly.  We discussed alternatives to walking.  2. ICD: 2 years of battery left.  Followed by EP. 3. HTN: The current medical regimen is effective;  continue present plan and medications. 4. Obesity: Difficulty losing weight.  Outside of holiday time, eating healthy.  Avoids processed foods. LDL 122. 5. Anticoagulated: No bleeding on ELiquis. Diagnosed with AFib in 5/21. Started on Eliquis 5 BID by Dr. Lovena Le for stroke prevention.  Hbg and Cr stable in 9/21.   Current medicines are reviewed at length with the patient today.  The patient concerns regarding her medicines were addressed.  The following changes have been made:  No change  Labs/ tests ordered today include:  No orders of the defined types were placed in this encounter.   Recommend 150 minutes/week of aerobic exercise Low fat, low carb, high fiber diet recommended  Disposition:   FU in 1 year   Signed, Larae Grooms, MD  04/16/2020 9:05 AM    [facility] [address], [place], [place]  [address] Phone: [phone]; Fax: [phone]

## 2020-04-16 ENCOUNTER — Encounter: Payer: Self-pay | Admitting: Interventional Cardiology

## 2020-04-16 ENCOUNTER — Other Ambulatory Visit: Payer: Self-pay

## 2020-04-16 ENCOUNTER — Ambulatory Visit (INDEPENDENT_AMBULATORY_CARE_PROVIDER_SITE_OTHER): Payer: Medicare HMO | Admitting: Interventional Cardiology

## 2020-04-16 VITALS — BP 100/60 | HR 97 | Ht 64.0 in | Wt 208.8 lb

## 2020-04-16 DIAGNOSIS — I5022 Chronic systolic (congestive) heart failure: Secondary | ICD-10-CM | POA: Diagnosis not present

## 2020-04-16 DIAGNOSIS — I10 Essential (primary) hypertension: Secondary | ICD-10-CM

## 2020-04-16 DIAGNOSIS — E669 Obesity, unspecified: Secondary | ICD-10-CM | POA: Diagnosis not present

## 2020-04-16 DIAGNOSIS — Z95810 Presence of automatic (implantable) cardiac defibrillator: Secondary | ICD-10-CM | POA: Diagnosis not present

## 2020-04-16 DIAGNOSIS — I428 Other cardiomyopathies: Secondary | ICD-10-CM | POA: Diagnosis not present

## 2020-04-16 MED ORDER — APIXABAN 5 MG PO TABS
5.0000 mg | ORAL_TABLET | Freq: Two times a day (BID) | ORAL | 1 refills | Status: DC
Start: 2020-04-16 — End: 2020-09-10

## 2020-04-16 NOTE — Patient Instructions (Signed)
Medication Instructions:  Your physician recommends that you continue on your current medications as directed. Please refer to the Current Medication list given to you today.  *If you need a refill on your cardiac medications before your next appointment, please call your pharmacy*   Lab Work: None  If you have labs (blood work) drawn today and your tests are completely normal, you will receive your results only by: . MyChart Message (if you have MyChart) OR . A paper copy in the mail If you have any lab test that is abnormal or we need to change your treatment, we will call you to review the results.   Testing/Procedures: None  Follow-Up: At [facility], you and your health needs are our priority.  As part of our continuing mission to provide you with exceptional heart care, we have created designated Provider Care Teams.  These Care Teams include your primary Cardiologist (physician) and Advanced Practice Providers (APPs -  Physician Assistants and Nurse Practitioners) who all work together to provide you with the care you need, when you need it.  We recommend signing up for the patient portal called "MyChart".  Sign up information is provided on this After Visit Summary.  MyChart is used to connect with patients for Virtual Visits (Telemedicine).  Patients are able to view lab/test results, encounter notes, upcoming appointments, etc.  Non-urgent messages can be sent to your provider as well.   To learn more about what you can do with MyChart, go to [email or web address].    Your next appointment:   12 month(s)  The format for your next appointment:   In Person  Provider:   You may see Jayadeep Varanasi, MD or one of the following Advanced Practice Providers on your designated Care Team:    Dayna Dunn, PA-C  Michele Lenze, PA-C    Other Instructions  High-Fiber Diet Fiber, also called dietary fiber, is a type of carbohydrate that is found in fruits, vegetables, whole  grains, and beans. A high-fiber diet can have many health benefits. Your health care provider may recommend a high-fiber diet to help:  Prevent constipation. Fiber can make your bowel movements more regular.  Lower your cholesterol.  Relieve the following conditions: ? Swelling of veins in the anus (hemorrhoids). ? Swelling and irritation (inflammation) of specific areas of the digestive tract (uncomplicated diverticulosis). ? A problem of the large intestine (colon) that sometimes causes pain and diarrhea (irritable bowel syndrome, IBS).  Prevent overeating as part of a weight-loss plan.  Prevent heart disease, type 2 diabetes, and certain cancers. What is my plan? The recommended daily fiber intake in grams (g) includes:  38 g for men age 50 or younger.  30 g for men over age 50.  25 g for women age 50 or younger.  21 g for women over age 50. You can get the recommended daily intake of dietary fiber by:  Eating a variety of fruits, vegetables, grains, and beans.  Taking a fiber supplement, if it is not possible to get enough fiber through your diet. What do I need to know about a high-fiber diet?  It is better to get fiber through food sources rather than from fiber supplements. There is not a lot of research about how effective supplements are.  Always check the fiber content on the nutrition facts label of any prepackaged food. Look for foods that contain 5 g of fiber or more per serving.  Talk with a diet and nutrition specialist (dietitian) if you   have questions about specific foods that are recommended or not recommended for your medical condition, especially if those foods are not listed below.  Gradually increase how much fiber you consume. If you increase your intake of dietary fiber too quickly, you may have bloating, cramping, or gas.  Drink plenty of water. Water helps you to digest fiber. What are tips for following this plan?  Eat a wide variety of high-fiber  foods.  Make sure that half of the grains that you eat each day are whole grains.  Eat breads and cereals that are made with whole-grain flour instead of refined flour or white flour.  Eat brown rice, bulgur wheat, or millet instead of white rice.  Start the day with a breakfast that is high in fiber, such as a cereal that contains 5 g of fiber or more per serving.  Use beans in place of meat in soups, salads, and pasta dishes.  Eat high-fiber snacks, such as berries, raw vegetables, nuts, and popcorn.  Choose whole fruits and vegetables instead of processed forms like juice or sauce. What foods can I eat?  Fruits Berries. Pears. Apples. Oranges. Avocado. Prunes and raisins. Dried figs. Vegetables Sweet potatoes. Spinach. Kale. Artichokes. Cabbage. Broccoli. Cauliflower. Green peas. Carrots. Squash. Grains Whole-grain breads. Multigrain cereal. Oats and oatmeal. Brown rice. Barley. Bulgur wheat. Millet. Quinoa. Bran muffins. Popcorn. Rye wafer crackers. Meats and other proteins Navy, kidney, and pinto beans. Soybeans. Split peas. Lentils. Nuts and seeds. Dairy Fiber-fortified yogurt. Beverages Fiber-fortified soy milk. Fiber-fortified orange juice. Other foods Fiber bars. The items listed above may not be a complete list of recommended foods and beverages. Contact a dietitian for more options. What foods are not recommended? Fruits Fruit juice. Cooked, strained fruit. Vegetables Fried potatoes. Canned vegetables. Well-cooked vegetables. Grains White bread. Pasta made with refined flour. White rice. Meats and other proteins Fatty cuts of meat. Fried chicken or fried fish. Dairy Milk. Yogurt. Cream cheese. Sour cream. Fats and oils Butters. Beverages Soft drinks. Other foods Cakes and pastries. The items listed above may not be a complete list of foods and beverages to avoid. Contact a dietitian for more information. Summary  Fiber is a type of carbohydrate. It is  found in fruits, vegetables, whole grains, and beans.  There are many health benefits of eating a high-fiber diet, such as preventing constipation, lowering blood cholesterol, helping with weight loss, and reducing your risk of heart disease, diabetes, and certain cancers.  Gradually increase your intake of fiber. Increasing too fast can result in cramping, bloating, and gas. Drink plenty of water while you increase your fiber.  The best sources of fiber include whole fruits and vegetables, whole grains, nuts, seeds, and beans. This information is not intended to replace advice given to you by your health care provider. Make sure you discuss any questions you have with your health care provider. Document Revised: 01/30/2017 Document Reviewed: 01/30/2017 Elsevier Patient Education  2020 Elsevier Inc.   

## 2020-04-17 ENCOUNTER — Other Ambulatory Visit: Payer: Self-pay | Admitting: Interventional Cardiology

## 2020-04-20 NOTE — Progress Notes (Signed)
Remote ICD transmission.   

## 2020-05-07 ENCOUNTER — Other Ambulatory Visit: Payer: Self-pay | Admitting: Interventional Cardiology

## 2020-05-14 ENCOUNTER — Other Ambulatory Visit: Payer: Self-pay | Admitting: Interventional Cardiology

## 2020-05-18 ENCOUNTER — Ambulatory Visit (INDEPENDENT_AMBULATORY_CARE_PROVIDER_SITE_OTHER): Payer: Medicare HMO

## 2020-05-18 DIAGNOSIS — I5022 Chronic systolic (congestive) heart failure: Secondary | ICD-10-CM | POA: Diagnosis not present

## 2020-05-18 DIAGNOSIS — Z95810 Presence of automatic (implantable) cardiac defibrillator: Secondary | ICD-10-CM

## 2020-05-22 NOTE — Progress Notes (Signed)
EPIC Encounter for ICM Monitoring  Patient Name: Paula Booker is a 75 y.o. female Date: 05/22/2020 Primary Care Physican: [name], MD Primary [place] Electrophysiologist: Lovena Le 2/11/2022Weight:201lbs  Time in AT/AF <0.1 hr/day (<0.1%)   Spoke with patient and reports feeling well at this time. Denies fluid symptoms.   Optivol thoracic impedancesuggestingnormalfluid levels.  Prescribed:Furosemide40 mgtake2 tablets(80 mg total)by mouth in the morning and an additional 40 mg once daily as needed for fluid retention.  Labs: 12/30/2019 Creatinine 0.96, BUN 9, Potassium 4.1, Sodium 135, GFR 59->60 07/31/2019 Creatinine0.84, BUN18, Potassium4.4, Sodium140, GFR>60 06/12/2019 Creatinine 0.81, BUN 18, Potassium 4.2, Sodium 140, GFR 72-83 A complete set of results can be found in Results Review.  Recommendations:No changes and encouraged to call if experiencing any fluid symptoms.  Follow-up plan: ICM clinic phone appointment on3/14/2022. 91 day device clinic remote transmission3/29/2022.   EP/Cardiology Office Visits: Recall 03/18/2021 with Dr Caryl Comes.  Recall 04/11/2021 with Dr Irish Lack.   Copy of ICM check sent to Dr.Taylor   3 month ICM trend: 05/18/2020.    1 Year ICM trend:       [name], RN 05/22/2020 10:45 AM

## 2020-05-30 DIAGNOSIS — R918 Other nonspecific abnormal finding of lung field: Secondary | ICD-10-CM | POA: Diagnosis not present

## 2020-05-30 DIAGNOSIS — I447 Left bundle-branch block, unspecified: Secondary | ICD-10-CM | POA: Diagnosis not present

## 2020-05-30 DIAGNOSIS — R0602 Shortness of breath: Secondary | ICD-10-CM | POA: Diagnosis not present

## 2020-05-30 DIAGNOSIS — R0789 Other chest pain: Secondary | ICD-10-CM | POA: Diagnosis not present

## 2020-05-30 DIAGNOSIS — R600 Localized edema: Secondary | ICD-10-CM | POA: Diagnosis not present

## 2020-05-30 DIAGNOSIS — R0989 Other specified symptoms and signs involving the circulatory and respiratory systems: Secondary | ICD-10-CM | POA: Diagnosis not present

## 2020-05-30 DIAGNOSIS — I499 Cardiac arrhythmia, unspecified: Secondary | ICD-10-CM | POA: Diagnosis not present

## 2020-05-30 DIAGNOSIS — J80 Acute respiratory distress syndrome: Secondary | ICD-10-CM | POA: Diagnosis not present

## 2020-05-30 DIAGNOSIS — Z95810 Presence of automatic (implantable) cardiac defibrillator: Secondary | ICD-10-CM | POA: Diagnosis not present

## 2020-05-30 DIAGNOSIS — Z20822 Contact with and (suspected) exposure to covid-19: Secondary | ICD-10-CM | POA: Diagnosis not present

## 2020-05-30 DIAGNOSIS — R079 Chest pain, unspecified: Secondary | ICD-10-CM | POA: Diagnosis not present

## 2020-05-30 DIAGNOSIS — R072 Precordial pain: Secondary | ICD-10-CM | POA: Diagnosis not present

## 2020-05-31 DIAGNOSIS — I447 Left bundle-branch block, unspecified: Secondary | ICD-10-CM | POA: Diagnosis not present

## 2020-06-05 DIAGNOSIS — R072 Precordial pain: Secondary | ICD-10-CM

## 2020-06-06 ENCOUNTER — Other Ambulatory Visit: Payer: Self-pay | Admitting: Pulmonary Disease

## 2020-06-06 DIAGNOSIS — R0602 Shortness of breath: Secondary | ICD-10-CM

## 2020-06-09 MED ORDER — PREDNISONE 50 MG PO TABS: ORAL_TABLET | ORAL | 0 refills | Status: DC

## 2020-06-22 ENCOUNTER — Ambulatory Visit (INDEPENDENT_AMBULATORY_CARE_PROVIDER_SITE_OTHER): Payer: Medicare HMO

## 2020-06-22 DIAGNOSIS — Z95810 Presence of automatic (implantable) cardiac defibrillator: Secondary | ICD-10-CM

## 2020-06-22 DIAGNOSIS — I5022 Chronic systolic (congestive) heart failure: Secondary | ICD-10-CM

## 2020-06-24 ENCOUNTER — Telehealth (HOSPITAL_COMMUNITY): Payer: Self-pay | Admitting: Emergency Medicine

## 2020-06-24 NOTE — Telephone Encounter (Signed)
Reaching out to patient to offer assistance regarding upcoming cardiac imaging study; pt verbalizes understanding of appt date/time, parking situation and where to check in, pre-test NPO status and medications ordered, and verified current allergies; name and call back number provided for further questions should they arise Marchia Bond RN Navigator Cardiac [facility] [place] [facility] [phone] office [phone] cell  Pt taking prednisone at 8:15pm, 2:15am, and 8:15am + 50mg  benadryl 1 hr prior to scan and 25mg  carvedilol 2 hr prior to scan. Holding lasix, spironolactone, zyrtec Paula Booker

## 2020-06-24 NOTE — Progress Notes (Signed)
EPIC Encounter for ICM Monitoring  Patient Name: Paula Booker is a 75 y.o. female Date: 06/24/2020 Primary Care Physican: [name], MD Primary [place] Electrophysiologist: Lovena Le 3/16/2022Weight:201lbs  Time in AT/AF             <0.1 hr/day (<0.1%)   Spoke with patient and reports feeling well at this time.  Denies fluid symptoms.    Optivol thoracic impedancesuggestingnormalfluid levels.   Prescribed:Furosemide40 mgtake2 tablets(80 mg total)by mouth in the morning and an additional 40 mg once daily as needed for fluid retention.  Labs: 05/30/2020 Creatinine 0.97, BUN 22, Potassium 4.0, Sodium 136, GFR 61 Care Everywhere 12/30/2019 Creatinine 0.96, BUN 9, Potassium 4.1, Sodium 135, GFR 59->60 07/31/2019 Creatinine0.84, BUN18, Potassium4.4, Sodium140, GFR>60 06/12/2019 Creatinine 0.81, BUN 18, Potassium 4.2, Sodium 140, GFR 72-83 A complete set of results can be found in Results Review.  Recommendations: No changes and encouraged to call if experiencing any fluid symptoms.  Follow-up plan: ICM clinic phone appointment on4/18/2022. 91 day device clinic remote transmission3/29/2022.   EP/Cardiology Office Visits: Recall 03/18/2021 with Dr Lovena Le.  Recall 04/11/2021 with Dr Irish Lack.   Copy of ICM check sent to Dr.Taylor   3 month ICM trend: 06/22/2020.    1 Year ICM trend:       [name], RN 06/24/2020 8:22 AM

## 2020-06-26 ENCOUNTER — Ambulatory Visit (HOSPITAL_COMMUNITY)
Admission: RE | Admit: 2020-06-26 | Discharge: 2020-06-26 | Disposition: A | Discharge status: Home / Self Care | Payer: Medicare HMO | Source: Ambulatory Visit | Attending: Interventional Cardiology | Admitting: Interventional Cardiology

## 2020-06-26 ENCOUNTER — Other Ambulatory Visit: Payer: Self-pay

## 2020-06-26 DIAGNOSIS — R072 Precordial pain: Secondary | ICD-10-CM | POA: Diagnosis not present

## 2020-06-26 MED ORDER — IOHEXOL 350 MG/ML SOLN
80.0000 mL | Freq: Once | INTRAVENOUS | Status: AC | PRN
  Administered 2020-06-26: 80 mL via INTRAVENOUS

## 2020-06-26 MED ORDER — NITROGLYCERIN 0.4 MG SL SUBL: 0.8000 mg | SUBLINGUAL_TABLET | Freq: Once | SUBLINGUAL | Status: AC

## 2020-06-26 MED ORDER — METOPROLOL TARTRATE 5 MG/5ML IV SOLN
INTRAVENOUS | Status: AC
  Administered 2020-06-26: 5 mg via INTRAVENOUS
  Filled 2020-06-26: qty 20

## 2020-06-26 MED ORDER — NITROGLYCERIN 0.4 MG SL SUBL
SUBLINGUAL_TABLET | SUBLINGUAL | Status: AC
  Administered 2020-06-26: 0.8 mg via SUBLINGUAL
  Filled 2020-06-26: qty 2

## 2020-06-26 MED ORDER — METOPROLOL TARTRATE 5 MG/5ML IV SOLN
5.0000 mg | INTRAVENOUS | Status: DC | PRN
  Administered 2020-06-26: 5 mg via INTRAVENOUS

## 2020-07-01 DIAGNOSIS — I428 Other cardiomyopathies: Secondary | ICD-10-CM | POA: Diagnosis not present

## 2020-07-01 DIAGNOSIS — F5101 Primary insomnia: Secondary | ICD-10-CM | POA: Diagnosis not present

## 2020-07-01 DIAGNOSIS — F331 Major depressive disorder, recurrent, moderate: Secondary | ICD-10-CM | POA: Diagnosis not present

## 2020-07-01 DIAGNOSIS — I4891 Unspecified atrial fibrillation: Secondary | ICD-10-CM | POA: Diagnosis not present

## 2020-07-01 DIAGNOSIS — Z6837 Body mass index (BMI) 37.0-37.9, adult: Secondary | ICD-10-CM | POA: Diagnosis not present

## 2020-07-01 DIAGNOSIS — E669 Obesity, unspecified: Secondary | ICD-10-CM | POA: Diagnosis not present

## 2020-07-07 ENCOUNTER — Ambulatory Visit (INDEPENDENT_AMBULATORY_CARE_PROVIDER_SITE_OTHER): Payer: Medicare HMO

## 2020-07-07 DIAGNOSIS — I428 Other cardiomyopathies: Secondary | ICD-10-CM

## 2020-07-07 LAB — CUP PACEART REMOTE DEVICE CHECK
Battery Remaining Longevity: 22 mo
Battery Voltage: 2.91 V
Brady Statistic AP VP Percent: 0.01 %
Brady Statistic AP VS Percent: 2.93 %
Brady Statistic AS VP Percent: 0.05 %
Brady Statistic AS VS Percent: 97.01 %
Brady Statistic RA Percent Paced: 2.91 %
Brady Statistic RV Percent Paced: 0.06 %
Date Time Interrogation Session: 20220329022727
HighPow Impedance: 93 Ohm
Implantable Lead Implant Date: 20131212
Implantable Lead Implant Date: 20131212
Implantable Lead Location: 753859
Implantable Lead Location: 753860
Implantable Lead Model: 5076
Implantable Lead Model: 6935
Implantable Pulse Generator Implant Date: 20131212
Lead Channel Impedance Value: 513 Ohm
Lead Channel Impedance Value: 646 Ohm
Lead Channel Impedance Value: 817 Ohm
Lead Channel Pacing Threshold Amplitude: 0.5 V
Lead Channel Pacing Threshold Amplitude: 0.5 V
Lead Channel Pacing Threshold Pulse Width: 0.4 ms
Lead Channel Pacing Threshold Pulse Width: 0.4 ms
Lead Channel Sensing Intrinsic Amplitude: 10.875 mV
Lead Channel Sensing Intrinsic Amplitude: 10.875 mV
Lead Channel Sensing Intrinsic Amplitude: 3.375 mV
Lead Channel Sensing Intrinsic Amplitude: 3.375 mV
Lead Channel Setting Pacing Amplitude: 2 V
Lead Channel Setting Pacing Amplitude: 2.5 V
Lead Channel Setting Pacing Pulse Width: 0.4 ms
Lead Channel Setting Sensing Sensitivity: 0.3 mV

## 2020-07-08 ENCOUNTER — Ambulatory Visit (INDEPENDENT_AMBULATORY_CARE_PROVIDER_SITE_OTHER): Payer: Medicare HMO | Admitting: Physician Assistant

## 2020-07-08 ENCOUNTER — Other Ambulatory Visit: Payer: Self-pay

## 2020-07-08 ENCOUNTER — Encounter: Payer: Self-pay | Admitting: Physician Assistant

## 2020-07-08 DIAGNOSIS — C4492 Squamous cell carcinoma of skin, unspecified: Secondary | ICD-10-CM

## 2020-07-08 DIAGNOSIS — L570 Actinic keratosis: Secondary | ICD-10-CM

## 2020-07-08 DIAGNOSIS — D485 Neoplasm of uncertain behavior of skin: Secondary | ICD-10-CM | POA: Diagnosis not present

## 2020-07-08 DIAGNOSIS — L820 Inflamed seborrheic keratosis: Secondary | ICD-10-CM | POA: Diagnosis not present

## 2020-07-08 DIAGNOSIS — Z1283 Encounter for screening for malignant neoplasm of skin: Secondary | ICD-10-CM | POA: Diagnosis not present

## 2020-07-08 DIAGNOSIS — D0439 Carcinoma in situ of skin of other parts of face: Secondary | ICD-10-CM | POA: Diagnosis not present

## 2020-07-08 HISTORY — DX: Squamous cell carcinoma of skin, unspecified: C44.92

## 2020-07-08 MED ORDER — TOLAK 4 % EX CREA: TOPICAL_CREAM | CUTANEOUS | 1 refills | Status: DC

## 2020-07-08 NOTE — Progress Notes (Signed)
   Follow-Up Visit   Subjective  Paula Booker is a 75 y.o. female who presents for the following: Skin Problem (Left post thigh- scratching in sleep & right inner eye- wants checked).   The following portions of the chart were reviewed this encounter and updated as appropriate:  Tobacco  Allergies  Meds  Problems  Med Hx  Surg Hx  Fam Hx      Objective  Well appearing patient in no apparent distress; mood and affect are within normal limits.  A full examination was performed including scalp, head, eyes, ears, nose, lips, neck, chest, axillae, abdomen, back, buttocks, bilateral upper extremities, bilateral lower extremities, hands, feet, fingers, toes, fingernails, and toenails. All findings within normal limits unless otherwise noted below.  Objective  Head to toe: No atypical nevi No signs of non-mole skin cancer.   Objective  Left Thigh - Posterior: Erythematous stuck-on, waxy papule or plaque.   Objective  Right Malar Cheek: White crust     Objective  Dorsum of Nose: Erythematous patches with gritty scale.   Assessment & Plan  Encounter for screening for malignant neoplasm of skin Head to toe  Yearly skin check  Seborrheic keratosis, inflamed Left Thigh - Posterior  Destruction of lesion - Left Thigh - Posterior Complexity: simple   Destruction method: cryotherapy   Informed consent: discussed and consent obtained   Timeout:  patient name, date of birth, surgical site, and procedure verified Lesion destroyed using liquid nitrogen: Yes   Cryotherapy cycles:  3 Outcome: patient tolerated procedure well with no complications    Neoplasm of uncertain behavior of skin Right Malar Cheek  Skin / nail biopsy Type of biopsy: tangential   Informed consent: discussed and consent obtained   Timeout: patient name, date of birth, surgical site, and procedure verified   Procedure prep:  Patient was prepped and draped in usual sterile fashion (Non  sterile) Prep type:  Chlorhexidine Anesthesia: the lesion was anesthetized in a standard fashion   Anesthetic:  1% lidocaine w/ epinephrine 1-100,000 local infiltration Instrument used: flexible razor blade   Outcome: patient tolerated procedure well   Post-procedure details: wound care instructions given    Specimen 1 - Surgical pathology Differential Diagnosis: r/o atypia  Check Margins: No  AK (actinic keratosis) Dorsum of Nose  Fluorouracil (TOLAK) 4 % CREA - Dorsum of Nose    I, Deepa Barthel, PA-C, have reviewed all documentation's for this visit.  The documentation on 07/08/20 for the exam, diagnosis, procedures and orders are all accurate and complete.

## 2020-07-08 NOTE — Patient Instructions (Signed)

## 2020-07-13 DIAGNOSIS — M47816 Spondylosis without myelopathy or radiculopathy, lumbar region: Secondary | ICD-10-CM | POA: Diagnosis not present

## 2020-07-14 DIAGNOSIS — M1711 Unilateral primary osteoarthritis, right knee: Secondary | ICD-10-CM | POA: Diagnosis not present

## 2020-07-14 DIAGNOSIS — M25561 Pain in right knee: Secondary | ICD-10-CM | POA: Diagnosis not present

## 2020-07-15 ENCOUNTER — Other Ambulatory Visit: Payer: Self-pay | Admitting: Internal Medicine

## 2020-07-15 DIAGNOSIS — I428 Other cardiomyopathies: Secondary | ICD-10-CM

## 2020-07-16 ENCOUNTER — Telehealth: Payer: Self-pay | Admitting: Physician Assistant

## 2020-07-16 NOTE — Telephone Encounter (Signed)
-----   Message from Warren Danes, [place] sent at 07/16/2020  8:15 AM EDT ----- 15 sx

## 2020-07-16 NOTE — Telephone Encounter (Signed)
Says she had sutures put in thigh on last visit and needs to have them out

## 2020-07-16 NOTE — Telephone Encounter (Signed)
Spoke with patient - informed her she does not have suture-  She was reading the aftercare instructions and assumed she had sutures.  Informed her that she does not and also gave her pathology results and scheduled surgery.

## 2020-07-21 NOTE — Progress Notes (Signed)
Remote ICD transmission.   

## 2020-07-23 DIAGNOSIS — M47816 Spondylosis without myelopathy or radiculopathy, lumbar region: Secondary | ICD-10-CM | POA: Diagnosis not present

## 2020-07-23 DIAGNOSIS — M47817 Spondylosis without myelopathy or radiculopathy, lumbosacral region: Secondary | ICD-10-CM | POA: Diagnosis not present

## 2020-07-27 ENCOUNTER — Ambulatory Visit (INDEPENDENT_AMBULATORY_CARE_PROVIDER_SITE_OTHER): Payer: Medicare HMO

## 2020-07-27 DIAGNOSIS — I5022 Chronic systolic (congestive) heart failure: Secondary | ICD-10-CM

## 2020-07-27 DIAGNOSIS — Z95810 Presence of automatic (implantable) cardiac defibrillator: Secondary | ICD-10-CM | POA: Diagnosis not present

## 2020-07-29 NOTE — Progress Notes (Signed)
EPIC Encounter for ICM Monitoring  Patient Name: Paula Booker is a 75 y.o. female Date: 07/29/2020 Primary Care Physican: [name], MD Primary [place] Electrophysiologist: Lovena Le 4/20/2022Weight:203lbs  Time in AT/AF0.0 hr/day (0.0%)   Spoke with patient and reports taking extra Furosemide this past weekend due to swelling of ankles.     Optivol thoracic impedancesuggestingnormalfluid levels.   Prescribed:  Furosemide40 mgtake1-2 tablets(40-80 mg total)as directed once daily for edema shortness of breath or weight gain  Spironolactone 25 mg take 1 tablet by mouth daily  Labs: 05/30/2020 Creatinine 0.97, BUN 22, Potassium 4.0, Sodium 136, GFR 61 Care Everywhere 12/30/2019 Creatinine 0.96, BUN 9, Potassium 4.1, Sodium 135, GFR 59->60 07/31/2019 Creatinine0.84, BUN18, Potassium4.4, Sodium140, GFR>60 06/12/2019 Creatinine 0.81, BUN 18, Potassium 4.2, Sodium 140, GFR 72-83 A complete set of results can be found in Results Review.  Recommendations: No changes and encouraged to call if experiencing any fluid symptoms.  Follow-up plan: ICM clinic phone appointment on6/09/2020. 91 day device clinic remote transmission6/28/2022.   EP/Cardiology Office Visits: Recall 03/18/2021 with Dr Lovena Le. Recall 1/1/2023with Dr Irish Lack.   Copy of ICM check sent to Dr.Taylor  3 month ICM trend: 07/27/2020.    1 Year ICM trend:       [name], RN 07/29/2020 8:43 AM

## 2020-08-12 ENCOUNTER — Ambulatory Visit: Payer: Medicare HMO | Admitting: Registered"

## 2020-09-10 ENCOUNTER — Other Ambulatory Visit: Payer: Self-pay | Admitting: Internal Medicine

## 2020-09-10 NOTE — Telephone Encounter (Signed)
Prescription refill request for Eliquis received. Indication: afib  Last office visit: Varanasi, 04/16/2020 Scr: 0.97, 05/30/2020 Age: 75 yo  Weight: 94.7 kg   Pt is on the correct dose of Eliquis per dosing criteria, prescription refill sent for Eliquis 5mg  BID.

## 2020-09-11 ENCOUNTER — Emergency Department (HOSPITAL_BASED_OUTPATIENT_CLINIC_OR_DEPARTMENT_OTHER)
Admission: EM | Admit: 2020-09-11 | Discharge: 2020-09-11 | Disposition: A | Discharge status: Home / Self Care | Payer: Medicare HMO | Attending: Emergency Medicine | Admitting: Emergency Medicine

## 2020-09-11 ENCOUNTER — Encounter (HOSPITAL_BASED_OUTPATIENT_CLINIC_OR_DEPARTMENT_OTHER): Payer: Self-pay | Admitting: Emergency Medicine

## 2020-09-11 ENCOUNTER — Other Ambulatory Visit: Payer: Self-pay

## 2020-09-11 DIAGNOSIS — Z85828 Personal history of other malignant neoplasm of skin: Secondary | ICD-10-CM | POA: Insufficient documentation

## 2020-09-11 DIAGNOSIS — I110 Hypertensive heart disease with heart failure: Secondary | ICD-10-CM | POA: Insufficient documentation

## 2020-09-11 DIAGNOSIS — I4891 Unspecified atrial fibrillation: Secondary | ICD-10-CM | POA: Diagnosis not present

## 2020-09-11 DIAGNOSIS — S0501XA Injury of conjunctiva and corneal abrasion without foreign body, right eye, initial encounter: Secondary | ICD-10-CM | POA: Insufficient documentation

## 2020-09-11 DIAGNOSIS — S0591XA Unspecified injury of right eye and orbit, initial encounter: Secondary | ICD-10-CM | POA: Diagnosis present

## 2020-09-11 DIAGNOSIS — J45909 Unspecified asthma, uncomplicated: Secondary | ICD-10-CM | POA: Diagnosis not present

## 2020-09-11 DIAGNOSIS — Z950 Presence of cardiac pacemaker: Secondary | ICD-10-CM | POA: Insufficient documentation

## 2020-09-11 DIAGNOSIS — X58XXXA Exposure to other specified factors, initial encounter: Secondary | ICD-10-CM | POA: Insufficient documentation

## 2020-09-11 DIAGNOSIS — I5023 Acute on chronic systolic (congestive) heart failure: Secondary | ICD-10-CM | POA: Diagnosis not present

## 2020-09-11 DIAGNOSIS — Z79899 Other long term (current) drug therapy: Secondary | ICD-10-CM | POA: Diagnosis not present

## 2020-09-11 DIAGNOSIS — Z7901 Long term (current) use of anticoagulants: Secondary | ICD-10-CM | POA: Insufficient documentation

## 2020-09-11 DIAGNOSIS — Z7952 Long term (current) use of systemic steroids: Secondary | ICD-10-CM | POA: Insufficient documentation

## 2020-09-11 MED ORDER — ERYTHROMYCIN 5 MG/GM OP OINT
TOPICAL_OINTMENT | Freq: Once | OPHTHALMIC | Status: AC
  Filled 2020-09-11: qty 3.5

## 2020-09-11 MED ORDER — HYPROMELLOSE (GONIOSCOPIC) 2.5 % OP SOLN: 1.0000 [drp] | Freq: Four times a day (QID) | OPHTHALMIC | 0 refills | Status: DC | PRN

## 2020-09-11 MED ORDER — TETRACAINE HCL 0.5 % OP SOLN
2.0000 [drp] | Freq: Once | OPHTHALMIC | Status: AC
  Administered 2020-09-11: 2 [drp] via OPHTHALMIC
  Filled 2020-09-11: qty 4

## 2020-09-11 MED ORDER — FLUORESCEIN SODIUM 1 MG OP STRP
1.0000 | ORAL_STRIP | Freq: Once | OPHTHALMIC | Status: AC
  Administered 2020-09-11: 1 via OPHTHALMIC
  Filled 2020-09-11: qty 1

## 2020-09-11 MED ORDER — ERYTHROMYCIN 5 MG/GM OP OINT: TOPICAL_OINTMENT | OPHTHALMIC | 0 refills | Status: DC

## 2020-09-11 NOTE — Discharge Instructions (Signed)
You are seen in the ER for eye complaints.  Our examination reveals that you have corneal abrasion.  Please take the antibiotic ointment that has been prescribed.  Provided is the phone number for the eye specialist, if you are not better by Monday please call the number provided for close follow-up.  Return to the ER if you have loss of vision or severe eye pain.

## 2020-09-11 NOTE — ED Notes (Signed)
Patient verbalizes understanding of discharge instructions. Opportunity for questioning and answers were provided. Armband removed by staff, pt discharged from ED. Pt. ambulatory and discharged home.  

## 2020-09-11 NOTE — ED Notes (Signed)
Visual acuity  Right eye without glasses 20/50,with glasses 20/40.

## 2020-09-11 NOTE — ED Provider Notes (Signed)
[facility] EMERGENCY DEPT Provider Note   CSN: [id number] Arrival date & time: 09/11/20  1209     History Chief Complaint  Patient presents with  . Eye Pain    Paula Booker is a 75 y.o. female.  HPI    75 year old female comes in with chief complaint of eye pain and blurry vision. Patient has history of WPW with AICD placement, CHF, A. fib on blood thinners.  She reports that 2 days ago she was sewing when she suddenly had some eye pain in her right side.  She washed her eyes vigorously as there was itchy sensation as well.  Subsequently she has had some blurry vision.  No associated dizziness, focal numbness, weakness.  She denies any tearing from the eye.  Symptoms limited to the right eye only.  Past Medical History:  Diagnosis Date  . AICD (automatic cardioverter/defibrillator) present   . Anemia    as a child  . Anxiety   . Arthritis    "hands and knees" (03/22/2012)  . Asthma   . Basal cell carcinoma 04/27/2011   mid forehead([facility])  . Bursitis   . CHF (congestive heart failure) ([place]) 6/13   pt. denies  . Depression   . Dysrhythmia    WPW  . GERD (gastroesophageal reflux disease)   . Head injury, acute, with loss of consciousness ([place])   . History of hiatal hernia   . Hypertension   . ICD (implantable cardiac defibrillator) in place Dec 2013  . Migraines    migraines   . NICM (nonischemic cardiomyopathy) ([place])   . Obesity (BMI 30-39.9)    negative sleep study 2014  . OSA (obstructive sleep apnea) 05/27/2018   Mild with AHI 6/hr    no cpap  . Pneumonia    hx  . Shortness of breath    "@ any time I can get SOB" (03/22/2012)  . Skin cancer 1999  . Sudden arrhythmia death syndrome   . WPW (Wolff-Parkinson-White syndrome)     Patient Active Problem List   Diagnosis Date Noted  . Paroxysmal atrial fibrillation ([place]) 08/20/2019  . Acute medial meniscus tear of right knee 08/09/2019  . Chondromalacia, right knee 08/09/2019  . SVT  (supraventricular tachycardia) ([place]) 11/02/2018  . ICD (implantable cardioverter-defibrillator) in place 11/02/2018  . OSA (obstructive sleep apnea) 05/27/2018  . Acute medial meniscus tear, left, initial encounter 11/29/2017  . Chondromalacia of left knee 11/29/2017  . Migraines 09/23/2017  . Anxiety 09/23/2017  . Depression 09/23/2017  . Pacemaker 09/23/2017  . Chronic systolic heart failure ([place]) 04/28/2015  . UTI (urinary tract infection) 03/22/2015  . Sepsis ([place]) 03/19/2015  . Acute kidney injury ([place]) 03/19/2015  . Diarrhea 03/18/2015  . CAP (community acquired pneumonia) 03/18/2015  . Nausea vomiting and diarrhea 03/18/2015  . Compression fracture 06/05/2014  . Essential hypertension 03/27/2014  . Chest pain 01/27/2014  . Normal coronary arteries 01/27/2014  . Obesity (BMI 30-39.9)   . ICD- MDT implanted Dec 2013 03/26/2013  . NICM (nonischemic cardiomyopathy) ([place]) 02/21/2013  . GERD (gastroesophageal reflux disease) 04/23/2012  . Asthma in adult, mild persistent, uncomplicated 40/98/1191  . Shortness of breath 04/23/2012  . Syncope 03/01/2012  . Acute on chronic systolic CHF (congestive heart failure) ([place]) 03/01/2012  . Type B WPW syndrome- RFA Dec 2013 03/01/2012    Past Surgical History:  Procedure Laterality Date  . CARDIAC CATHETERIZATION  09/18/11   no significant CAD  . CARDIAC DEFIBRILLATOR PLACEMENT  03/22/2012   DDD  .  CHOLECYSTECTOMY  ~ 2003  . IMPLANTABLE CARDIOVERTER DEFIBRILLATOR IMPLANT N/A 03/22/2012   Procedure: IMPLANTABLE CARDIOVERTER DEFIBRILLATOR IMPLANT;  Surgeon: Evans Lance, MD;  Location: [facility] CATH LAB;  Service: Cardiovascular;  Laterality: N/A;  . KNEE ARTHROSCOPY Left 11/29/2017   Procedure: ARTHROSCOPY LEFT KNEE;  Surgeon: Dorna Leitz, MD;  Location: [facility] ORS;  Service: Orthopedics;  Laterality: Left;  . KNEE ARTHROSCOPY Right 08/09/2019   Procedure: ARTHROSCOPY RIGHT KNEE, CHONDROPLASTY,  PARTIAL MEDIAL  MENSICECTOMY;  Surgeon: Dorna Leitz, MD;  Location: [facility] ORS;  Service: Orthopedics;  Laterality: Right;  . KYPHOPLASTY N/A 11/27/2013   Procedure: KYPHOPLASTY;  Surgeon: Sinclair Ship, MD;  Location: [facility];  Service: Orthopedics;  Laterality: N/A;  T9, T11 kyphoplasty  . KYPHOPLASTY N/A 06/05/2014   Procedure: KYPHOPLASTY;  Surgeon: Sinclair Ship, MD;  Location: [facility];  Service: Orthopedics;  Laterality: N/A;  T7 kyphoplasty  . MOHS SURGERY  06/2011   "forehead" (03/22/2012)  . SKIN CANCER EXCISION  1999   "top of my head and my right back shoulder"  . SKIN CANCER EXCISION     back  . SKIN CANCER EXCISION     face  . SUPRAVENTRICULAR TACHYCARDIA ABLATION  03/22/2012   unable to induce VT/RN report 03/22/2012  . SUPRAVENTRICULAR TACHYCARDIA ABLATION N/A 03/22/2012   Procedure: SUPRAVENTRICULAR TACHYCARDIA ABLATION;  Surgeon: Evans Lance, MD;  Location: [facility] CATH LAB;  Service: Cardiovascular;  Laterality: N/A;  . TONSILLECTOMY AND ADENOIDECTOMY  1950  . TUBAL LIGATION  1978     OB History   No obstetric history on file.     Family History  Problem Relation Age of Onset  . Congestive Heart Failure Brother   . Atrial fibrillation Brother   . Congestive Heart Failure Mother        died 50  . Kidney Stones Mother   . Deafness Mother   . Atrial fibrillation Mother   . Congestive Heart Failure Father   . Deafness Father   . Healthy Sister   . Healthy Sister   . Healthy Sister   . Tuberculosis Paternal Grandfather     Social History   Tobacco Use  . Smoking status: Never Smoker  . Smokeless tobacco: Never Used  Vaping Use  . Vaping Use: Never used  Substance Use Topics  . Alcohol use: Not Currently    Comment: rare  . Drug use: No    Home Medications Prior to Admission medications   Medication Sig Start Date End Date Taking? Authorizing Provider  acetaminophen (TYLENOL) 500 MG tablet Take 500 mg by mouth in the morning and at bedtime.   Yes [provider]  albuterol  (VENTOLIN HFA) 108 (90 Base) MCG/ACT inhaler Inhale 2 puffs into the lungs every 6 (six) hours as needed for wheezing. For wheezing. Use 2 puffs 3 times daily x5 days and then every 6 hours as needed. 01/14/20  Yes Mannam, Praveen, MD  alendronate (FOSAMAX) 70 MG tablet Take 70 mg by mouth every Sunday.    Yes [provider]  carvedilol (COREG) 12.5 MG tablet TAKE 1 TABLET BY MOUTH TWICE DAILY WITH A MEAL 07/15/20  Yes Evans Lance, MD  cetirizine (ZYRTEC) 10 MG tablet Take 10 mg by mouth daily.   Yes [provider]  Cholecalciferol (VITAMIN D3) 5000 units CAPS Take 5,000 Units by mouth daily.   Yes [provider]  Cyanocobalamin (VITAMIN B-12) 5000 MCG LOZG Take 5,000 mcg by mouth daily.   Yes [provider]  ELIQUIS 5 MG TABS tablet Take 1 tablet by mouth twice daily 09/10/20  Yes Evans Lance, MD  erythromycin ophthalmic ointment Place a 1/2 inch ribbon of ointment into the lower eyelid. 09/11/20  Yes Varney Biles, MD  Fluorouracil (TOLAK) 4 % CREA Apply to nost qhs Monday - Sunday x 2 weeks- avoid sunlight 07/08/20  Yes Sheffield, Kelli R, PA-C  fluticasone (FLONASE) 50 MCG/ACT nasal spray Place 2 sprays into both nostrils daily.   Yes [provider]  fluticasone furoate-vilanterol (BREO ELLIPTA) 200-25 MCG/INH AEPB Inhale 1 puff by mouth once daily 06/11/20  Yes Mannam, Praveen, MD  furosemide (LASIX) 40 MG tablet TAKE 1 TO 2 TABLETS BY MOUTH AS DIRECTED ONCE DAILY FOR EDEMA, SHORTNESS OF BREATH OR WEIGHT GAIN 05/07/20  Yes Jettie Booze, MD  hydroxypropyl methylcellulose / hypromellose (ISOPTO TEARS / GONIOVISC) 2.5 % ophthalmic solution Place 1 drop into the right eye 4 (four) times daily as needed for dry eyes. 09/11/20  Yes Macon Sandiford, MD  ipratropium-albuterol (DUONEB) 0.5-2.5 (3) MG/3ML SOLN Take 3 mLs by nebulization every 4 (four) hours as needed. 10/09/17  Yes Lauraine Rinne, NP  losartan (COZAAR) 25 MG tablet Take 1 tablet by mouth  twice daily 04/17/20  Yes Jettie Booze, MD  Multiple Vitamins-Minerals (CENTRUM SILVER 50+WOMEN) TABS Take 1 tablet by mouth daily with breakfast.    Yes [provider]  pantoprazole (PROTONIX) 40 MG tablet Take 40 mg by mouth daily. 08/29/17  Yes [provider]  spironolactone (ALDACTONE) 25 MG tablet Take 1 tablet by mouth once daily 05/14/20  Yes Jettie Booze, MD  citalopram (CELEXA) 20 MG tablet Take 20 mg by mouth daily.     [provider]  predniSONE (DELTASONE) 50 MG tablet Take one tablet by mouth 13 hours, 7 hours, and 1 hour before CT. 06/09/20   Jettie Booze, MD    Allergies    Ivp dye [iodinated diagnostic agents], Penicillins, Sulfonamide derivatives, Tessalon [benzonatate], Sulfa antibiotics, Rosuvastatin calcium, and Onion  Review of Systems   Review of Systems  Constitutional: Positive for activity change.  Eyes: Positive for photophobia and visual disturbance.  Skin: Negative for rash.  Neurological: Negative for dizziness, weakness, light-headedness, numbness and headaches.  Hematological: Bruises/bleeds easily.  All other systems reviewed and are negative.   Physical Exam Updated Vital Signs BP 124/76   Pulse 89   Temp 98.2 F (36.8 C) (Oral)   Resp 16   SpO2 96%   Physical Exam Vitals and nursing note reviewed.  Constitutional:      Appearance: She is well-developed.  HENT:     Head: Normocephalic and atraumatic.  Eyes:     Comments: RIGHT Eye exam: EOMI, PERRL + photophobia 20/40 through the right eye Eye lid eversion reveals no foreign body Pt has no chemosis Fluorecin test under woods lamp reveals 2 distinct areas of dye uptake    Cardiovascular:     Rate and Rhythm: Normal rate.  Pulmonary:     Effort: Pulmonary effort is normal.  Abdominal:     General: Bowel sounds are normal.  Musculoskeletal:     Cervical back: Normal range of motion and neck supple.  Skin:    General: Skin is warm and  dry.  Neurological:     Mental Status: She is alert and oriented to person, place, and time.     ED Results / Procedures / Treatments   Labs (all labs ordered are listed,  but only abnormal results are displayed) Labs Reviewed - No data to display  EKG None  Radiology No results found.  Procedures Procedures   Medications Ordered in ED Medications  tetracaine (PONTOCAINE) 0.5 % ophthalmic solution 2 drop (2 drops Right Eye Given 09/11/20 1544)  fluorescein ophthalmic strip 1 strip (1 strip Right Eye Given 09/11/20 1544)  erythromycin ophthalmic ointment ( Right Eye Given 09/11/20 1547)    ED Course  I have reviewed the triage vital signs and the nursing notes.  Pertinent labs & imaging results that were available during my care of the patient were reviewed by me and considered in my medical decision making (see chart for details).    MDM Rules/Calculators/A&P                         75 year old female comes in a chief complaint of right eye itching.  She also has some visual disturbance and weak eye discomfort.  Symptoms started suddenly 2 days ago while she was sewing.  No trauma.  No tearing from the eye, does not appear to be allergic reaction or allergies.  Eye pressures were 26 and 28. Visual acuity on the right eyes 20/40. There is clear dye uptake, leading to the thought that patient at least has corneal abrasion.  With her having photophobia, unclear etiology for the eye discomfort in first place -other considerations includes CRA O, CRV O and optic neuritis.  I did discuss the case with Dr. Sherlyn Lees, ophthalmology.  She recommends treating the corneal abrasion for now, and for patient to contact her office if she is not better over the weekend.  This recommendation has been communicated to the patient.  Stable for discharge.  Final Clinical Impression(s) / ED Diagnoses Final diagnoses:  Abrasion of right cornea, initial encounter    Rx / DC Orders ED Discharge Orders          Ordered    erythromycin ophthalmic ointment        09/11/20 1534    hydroxypropyl methylcellulose / hypromellose (ISOPTO TEARS / GONIOVISC) 2.5 % ophthalmic solution  4 times daily PRN        09/11/20 1536           Varney Biles, MD 09/12/20 0825

## 2020-09-11 NOTE — ED Triage Notes (Signed)
Wednesday eye started itching, flushed with water,hasnt helped.

## 2020-09-17 ENCOUNTER — Telehealth: Payer: Self-pay

## 2020-09-17 NOTE — Telephone Encounter (Signed)
I spoke with the patient and she agreed to send missed ICM transmission. ?

## 2020-09-18 ENCOUNTER — Telehealth: Payer: Self-pay

## 2020-09-18 NOTE — Telephone Encounter (Signed)
Returned call to patient as requested by voice mail message regarding remote transmission date.  She reported [employer] is sending a new monitor.   Advised will reschedule the next remote transmission for 09/29/2020 and she agreed with new transmission date.

## 2020-09-29 ENCOUNTER — Ambulatory Visit (INDEPENDENT_AMBULATORY_CARE_PROVIDER_SITE_OTHER): Payer: Medicare HMO

## 2020-09-29 DIAGNOSIS — Z95810 Presence of automatic (implantable) cardiac defibrillator: Secondary | ICD-10-CM

## 2020-09-29 DIAGNOSIS — I5022 Chronic systolic (congestive) heart failure: Secondary | ICD-10-CM | POA: Diagnosis not present

## 2020-10-01 ENCOUNTER — Telehealth: Payer: Self-pay

## 2020-10-01 NOTE — Telephone Encounter (Signed)
I spoke with the patient and she states her monitor is not working. I called [employer] tech support to get additional help. Transmission received.

## 2020-10-01 NOTE — Telephone Encounter (Signed)
LMOVM for patient to send missed ICM transmission. 

## 2020-10-02 NOTE — Progress Notes (Signed)
EPIC Encounter for ICM Monitoring  Patient Name: Paula Booker is a 75 y.o. female Date: 10/02/2020 Primary Care Physican: [name], MD Primary Cardiologist: Irish Lack Electrophysiologist: Lovena Le 10/02/2020 Weight: 206 lbs   Time in AT/AF    <0.1 hr/day (<0.1%)         Spoke with patient and reports eating tomatoes with salt.  She thinks she is drinking > 64 oz fluid daily since she has been completing outdoor household tasks such as mowing the yard.   Optivol thoracic impedance suggesting normal fluid levels.   Prescribed:  Furosemide 40 mg take 1-2 tablets (40-80 mg total) as directed once daily for edema shortness of breath or weight gain Spironolactone 25 mg take 1 tablet by mouth daily   Labs: 05/30/2020 Creatinine 0.97, BUN 22, Potassium 4.0, Sodium 136, GFR 61 Care Everywhere 12/30/2019 Creatinine 0.96, BUN 9,   Potassium 4.1, Sodium 135, GFR 59->60 07/31/2019 Creatinine 0.84, BUN 18, Potassium 4.4, Sodium 140, GFR >60 06/12/2019 Creatinine 0.81, BUN 18, Potassium 4.2, Sodium 140, GFR 72-83 A complete set of results can be found in Results Review.   Recommendations:  She will take extra Furosemide x 1 dosage.  Recommendation to limit salt intake to 2000 mg daily and fluid intake to 64 oz daily.  Encouraged to call if experiencing any fluid symptoms.    Follow-up plan: ICM clinic phone appointment on 10/05/2020 (manual) to recheck fluid levels.   91 day device clinic remote transmission 10/06/2020.     EP/Cardiology Office Visits:  Recall 03/18/2021 with Dr Lovena Le.  Recall 04/11/2021 with Dr Irish Lack.     Copy of ICM check sent to Dr. Lovena Le   3 month ICM trend: 10/01/2020.    1 Year ICM trend:       [name], RN 10/02/2020 12:03 PM

## 2020-10-05 ENCOUNTER — Ambulatory Visit (INDEPENDENT_AMBULATORY_CARE_PROVIDER_SITE_OTHER): Payer: Medicare HMO

## 2020-10-05 DIAGNOSIS — Z95810 Presence of automatic (implantable) cardiac defibrillator: Secondary | ICD-10-CM

## 2020-10-05 DIAGNOSIS — I5022 Chronic systolic (congestive) heart failure: Secondary | ICD-10-CM

## 2020-10-05 NOTE — Progress Notes (Signed)
EPIC Encounter for ICM Monitoring  Patient Name: Paula Booker is a 75 y.o. female Date: 10/05/2020 Primary Care Physican: [name], MD Primary Cardiologist: Irish Lack Electrophysiologist: Lovena Le 10/02/2020 Weight: 206 lbs   Time in AT/AF   0.0 hr/day (0.0%)         Spoke with patient and reports feeling well at this time. Heart failure questions reviewed. Pt asymptomatic.   Optivol thoracic impedance suggesting normal fluid levels returned to normal after taking extra Furosemide.   Prescribed:  Furosemide 40 mg take 1-2 tablets (40-80 mg total) as directed once daily for edema shortness of breath or weight gain Spironolactone 25 mg take 1 tablet by mouth daily   Labs: 05/30/2020 Creatinine 0.97, BUN 22, Potassium 4.0, Sodium 136, GFR 61 Care Everywhere A complete set of results can be found in Results Review.   Recommendations:  Recommendation to limit salt intake to 2000 mg daily and fluid intake to 64 oz daily.  Encouraged to call if experiencing any fluid symptoms.   Follow-up plan: ICM clinic phone appointment on 11/09/2020.   91 day device clinic remote transmission 01/05/2021.     EP/Cardiology Office Visits:  Recall 03/18/2021 with Dr Lovena Le.  Recall 04/11/2021 with Dr Irish Lack.     Copy of ICM check sent to Dr. Lovena Le   3 month ICM trend: 10/05/2020.    1 Year ICM trend:       [name], RN 10/05/2020 9:37 AM

## 2020-10-06 ENCOUNTER — Ambulatory Visit (INDEPENDENT_AMBULATORY_CARE_PROVIDER_SITE_OTHER): Payer: Medicare HMO

## 2020-10-06 DIAGNOSIS — I428 Other cardiomyopathies: Secondary | ICD-10-CM

## 2020-10-06 LAB — CUP PACEART REMOTE DEVICE CHECK
Battery Remaining Longevity: 17 mo
Battery Voltage: 2.89 V
Brady Statistic AP VP Percent: 0.02 %
Brady Statistic AP VS Percent: 4.47 %
Brady Statistic AS VP Percent: 0.04 %
Brady Statistic AS VS Percent: 95.46 %
Brady Statistic RA Percent Paced: 4.41 %
Brady Statistic RV Percent Paced: 0.06 %
Date Time Interrogation Session: 20220628022827
HighPow Impedance: 84 Ohm
Implantable Lead Implant Date: 20131212
Implantable Lead Implant Date: 20131212
Implantable Lead Location: 753859
Implantable Lead Location: 753860
Implantable Lead Model: 5076
Implantable Lead Model: 6935
Implantable Pulse Generator Implant Date: 20131212
Lead Channel Impedance Value: 456 Ohm
Lead Channel Impedance Value: 665 Ohm
Lead Channel Impedance Value: 836 Ohm
Lead Channel Pacing Threshold Amplitude: 0.5 V
Lead Channel Pacing Threshold Amplitude: 0.625 V
Lead Channel Pacing Threshold Pulse Width: 0.4 ms
Lead Channel Pacing Threshold Pulse Width: 0.4 ms
Lead Channel Sensing Intrinsic Amplitude: 4.625 mV
Lead Channel Sensing Intrinsic Amplitude: 4.625 mV
Lead Channel Sensing Intrinsic Amplitude: 8.5 mV
Lead Channel Sensing Intrinsic Amplitude: 8.5 mV
Lead Channel Setting Pacing Amplitude: 2 V
Lead Channel Setting Pacing Amplitude: 2.5 V
Lead Channel Setting Pacing Pulse Width: 0.4 ms
Lead Channel Setting Sensing Sensitivity: 0.3 mV

## 2020-10-22 ENCOUNTER — Other Ambulatory Visit: Payer: Self-pay

## 2020-10-22 ENCOUNTER — Ambulatory Visit (INDEPENDENT_AMBULATORY_CARE_PROVIDER_SITE_OTHER): Payer: Medicare HMO | Admitting: Physician Assistant

## 2020-10-22 ENCOUNTER — Encounter: Payer: Self-pay | Admitting: Physician Assistant

## 2020-10-22 DIAGNOSIS — C44329 Squamous cell carcinoma of skin of other parts of face: Secondary | ICD-10-CM

## 2020-10-22 DIAGNOSIS — C4492 Squamous cell carcinoma of skin, unspecified: Secondary | ICD-10-CM

## 2020-10-22 NOTE — Patient Instructions (Signed)

## 2020-10-22 NOTE — Progress Notes (Signed)
   Follow-Up Visit   Subjective  Paula Booker is a 75 y.o. female who presents for the following: Procedure (Here for treatment right malar cheek- cis x 1).   The following portions of the chart were reviewed this encounter and updated as appropriate:  Tobacco  Allergies  Meds  Problems  Med Hx  Surg Hx  Fam Hx      Objective  Well appearing patient in no apparent distress; mood and affect are within normal limits.  A focused examination was performed including face. Relevant physical exam findings are noted in the Assessment and Plan.  Right Malar Cheek Slightly scaly macule   Assessment & Plan  Squamous cell carcinoma of skin Right Malar Cheek  Destruction of lesion Complexity: simple   Destruction method: electrodesiccation and curettage   Informed consent: discussed and consent obtained   Timeout:  patient name, date of birth, surgical site, and procedure verified Anesthesia: the lesion was anesthetized in a standard fashion   Anesthetic:  1% lidocaine w/ epinephrine 1-100,000 local infiltration Curettage performed in three different directions: Yes   Electrodesiccation performed over the curetted area: Yes   Curettage cycles:  3 Margin per side (cm):  0.1 Final wound size (cm):  1 Hemostasis achieved with:  aluminum chloride and electrodesiccation Outcome: patient tolerated procedure well with no complications   Post-procedure details: wound care instructions given   Additional details:  Wound innoculated with 5 fluorouracil solution.    I, Valma Rotenberg, PA-C, have reviewed all documentation's for this visit.  The documentation on 10/22/20 for the exam, diagnosis, procedures and orders are all accurate and complete.

## 2020-10-23 ENCOUNTER — Other Ambulatory Visit: Payer: Self-pay | Admitting: Internal Medicine

## 2020-10-23 DIAGNOSIS — I428 Other cardiomyopathies: Secondary | ICD-10-CM

## 2020-10-26 NOTE — Progress Notes (Signed)
Remote ICD transmission.   

## 2020-10-29 ENCOUNTER — Encounter (HOSPITAL_COMMUNITY): Payer: Self-pay | Admitting: *Deleted

## 2020-10-29 ENCOUNTER — Emergency Department (HOSPITAL_COMMUNITY): Payer: Medicare HMO

## 2020-10-29 ENCOUNTER — Emergency Department (HOSPITAL_COMMUNITY)
Admission: EM | Admit: 2020-10-29 | Discharge: 2020-10-29 | Disposition: A | Discharge status: Home / Self Care | Payer: Medicare HMO | Attending: Emergency Medicine | Admitting: Emergency Medicine

## 2020-10-29 ENCOUNTER — Other Ambulatory Visit: Payer: Self-pay

## 2020-10-29 DIAGNOSIS — R519 Headache, unspecified: Secondary | ICD-10-CM | POA: Diagnosis not present

## 2020-10-29 DIAGNOSIS — Z85828 Personal history of other malignant neoplasm of skin: Secondary | ICD-10-CM | POA: Diagnosis not present

## 2020-10-29 DIAGNOSIS — G500 Trigeminal neuralgia: Secondary | ICD-10-CM | POA: Diagnosis not present

## 2020-10-29 DIAGNOSIS — J4530 Mild persistent asthma, uncomplicated: Secondary | ICD-10-CM | POA: Insufficient documentation

## 2020-10-29 DIAGNOSIS — Z7951 Long term (current) use of inhaled steroids: Secondary | ICD-10-CM | POA: Diagnosis not present

## 2020-10-29 DIAGNOSIS — Z7901 Long term (current) use of anticoagulants: Secondary | ICD-10-CM | POA: Insufficient documentation

## 2020-10-29 DIAGNOSIS — H538 Other visual disturbances: Secondary | ICD-10-CM | POA: Insufficient documentation

## 2020-10-29 DIAGNOSIS — I5023 Acute on chronic systolic (congestive) heart failure: Secondary | ICD-10-CM | POA: Diagnosis not present

## 2020-10-29 DIAGNOSIS — Z79899 Other long term (current) drug therapy: Secondary | ICD-10-CM | POA: Diagnosis not present

## 2020-10-29 DIAGNOSIS — H40033 Anatomical narrow angle, bilateral: Secondary | ICD-10-CM | POA: Diagnosis not present

## 2020-10-29 DIAGNOSIS — I4891 Unspecified atrial fibrillation: Secondary | ICD-10-CM | POA: Insufficient documentation

## 2020-10-29 DIAGNOSIS — I110 Hypertensive heart disease with heart failure: Secondary | ICD-10-CM | POA: Insufficient documentation

## 2020-10-29 LAB — CBC WITH DIFFERENTIAL/PLATELET
Abs Immature Granulocytes: 0.03 10*3/uL (ref 0.00–0.07)
Basophils Absolute: 0.1 10*3/uL (ref 0.0–0.1)
Basophils Relative: 1 %
Eosinophils Absolute: 0.7 10*3/uL — ABNORMAL HIGH (ref 0.0–0.5)
Eosinophils Relative: 7 %
HCT: 39.9 % (ref 36.0–46.0)
Hemoglobin: 13.4 g/dL (ref 12.0–15.0)
Immature Granulocytes: 0 %
Lymphocytes Relative: 37 %
Lymphs Abs: 3.6 10*3/uL (ref 0.7–4.0)
MCH: 31.3 pg (ref 26.0–34.0)
MCHC: 33.6 g/dL (ref 30.0–36.0)
MCV: 93.2 fL (ref 80.0–100.0)
Monocytes Absolute: 0.8 10*3/uL (ref 0.1–1.0)
Monocytes Relative: 8 %
Neutro Abs: 4.6 10*3/uL (ref 1.7–7.7)
Neutrophils Relative %: 47 %
Platelets: 272 10*3/uL (ref 150–400)
RBC: 4.28 MIL/uL (ref 3.87–5.11)
RDW: 12.9 % (ref 11.5–15.5)
WBC: 9.8 10*3/uL (ref 4.0–10.5)
nRBC: 0 % (ref 0.0–0.2)

## 2020-10-29 LAB — COMPREHENSIVE METABOLIC PANEL
ALT: 25 U/L (ref 0–44)
AST: 31 U/L (ref 15–41)
Albumin: 4.1 g/dL (ref 3.5–5.0)
Alkaline Phosphatase: 78 U/L (ref 38–126)
Anion gap: 11 (ref 5–15)
BUN: 12 mg/dL (ref 8–23)
CO2: 27 mmol/L (ref 22–32)
Calcium: 9.5 mg/dL (ref 8.9–10.3)
Chloride: 101 mmol/L (ref 98–111)
Creatinine, Ser: 0.88 mg/dL (ref 0.44–1.00)
GFR, Estimated: >60 mL/min (ref >60)
Glucose, Bld: 122 mg/dL — ABNORMAL HIGH (ref 70–99)
Potassium: 3.7 mmol/L (ref 3.5–5.1)
Sodium: 139 mmol/L (ref 135–145)
Total Bilirubin: 0.6 mg/dL (ref 0.3–1.2)
Total Protein: 7.2 g/dL (ref 6.5–8.1)

## 2020-10-29 LAB — SEDIMENTATION RATE: Sed Rate: 17 mm/hr (ref 0–22)

## 2020-10-29 LAB — C-REACTIVE PROTEIN: CRP: 1 mg/dL — ABNORMAL HIGH (ref <1.0)

## 2020-10-29 NOTE — ED Provider Notes (Signed)
Emergency Medicine Provider Triage Evaluation Note  Paula Booker , a 75 y.o. female  was evaluated in triage.  Pt complains of headache. Headache since April. Associated eye pain, jaw pain. Intermittent greying of vision. Seen by dr shaw with opthalmology who was concerned for giant cell arteritis. Rec labs, esr, crp, and if elevated rec admit for IV steroids.  Intermittent drooping of right eye lid.  Review of Systems  Positive: Headache, jaw pain Negative: Numbness, weakness, slurred speech  Physical Exam  BP 135/69   Pulse 88   Temp 98.2 F (36.8 C) (Oral)   Resp 18   SpO2 94%  Gen:   Awake, no distress   Resp:  Normal effort  MSK:   Moves extremities without difficulty  Other:    Medical Decision Making  Medically screening exam initiated at 4:48 PM.  Appropriate orders placed.  Icelynn F Fertig was informed that the remainder of the evaluation will be completed by another provider, this initial triage assessment does not replace that evaluation, and the importance of remaining in the ED until their evaluation is complete.  Headache, facial pain   ,  A, PA-C 10/29/20 1650    Yao, David Hsienta, MD 11/02/20 1454  

## 2020-10-29 NOTE — ED Triage Notes (Signed)
Pt sent from PCP for possible giant cell arteritis or narrow angle glaucoma. She reports right sided headache, scalp tenderness, jaw pain, transient greying out of vision and drooping of eyelids.

## 2020-10-29 NOTE — ED Provider Notes (Signed)
[facility] DEPT Provider Note   CSN: [id number] Arrival date & time: 10/29/20  1620     History Chief Complaint  Patient presents with   Facial Pain   Eye Problem    Paula Booker is a 75 y.o. female.  She states that she has had a daily, severe headache for several months.  Sometimes it is so bad that she cries.  It is on the right temporal region and does not radiate.  She has some intermittent drooping of her right thigh, and when this is happening, she has some gray vision in this eye.  She was seen by ophthalmology today.  She had a full eye exam and was sent here for possible temporal arteritis.  The history is provided by the patient.  Headache Pain location:  R temporal Quality:  Sharp and stabbing Radiates to:  Does not radiate Severity currently:  10/10 Severity at highest:  10/10 Onset quality:  Gradual Duration: 3 months. Timing:  Constant Progression:  Unchanged Chronicity:  New Similar to prior headaches: no   Context comment:  No known cause Relieved by:  Nothing Worsened by:  Nothing Ineffective treatments:  None tried Associated symptoms: blurred vision and visual change   Associated symptoms: no abdominal pain, no back pain, no cough, no dizziness, no ear pain, no eye pain, no fever, no focal weakness, no loss of balance, no nausea, no neck pain, no neck stiffness, no numbness, no paresthesias, no photophobia, no seizures, no sore throat, no tingling and no vomiting       Past Medical History:  Diagnosis Date   AICD (automatic cardioverter/defibrillator) present    Anemia    as a child   Anxiety    Arthritis    "hands and knees" (03/22/2012)   Asthma    Basal cell carcinoma 04/27/2011   mid forehead(MOHS)   Bursitis    CHF (congestive heart failure) ([place]) 09/2011   pt. denies   Depression    Dysrhythmia    WPW   GERD (gastroesophageal reflux disease)    Head injury, acute, with loss of consciousness ([place])     History of hiatal hernia    Hypertension    ICD (implantable cardiac defibrillator) in place 03/2012   Migraines    migraines    NICM (nonischemic cardiomyopathy) (HCC)    Obesity (BMI 30-39.9)    negative sleep study 2014   OSA (obstructive sleep apnea) 05/27/2018   Mild with AHI 6/hr    no cpap   Pneumonia    hx   Shortness of breath    "@ any time I can get SOB" (03/22/2012)   Skin cancer 1999   Squamous cell carcinoma of skin 07/08/2020   in situ right malar cheek-[id number]   Sudden arrhythmia death syndrome    WPW (Wolff-Parkinson-White syndrome)     Patient Active Problem List   Diagnosis Date Noted   Paroxysmal atrial fibrillation ([place]) 08/20/2019   Acute medial meniscus tear of right knee 08/09/2019   Chondromalacia, right knee 08/09/2019   SVT (supraventricular tachycardia) ([place]) 11/02/2018   ICD (implantable cardioverter-defibrillator) in place 11/02/2018   OSA (obstructive sleep apnea) 05/27/2018   Acute medial meniscus tear, left, initial encounter 11/29/2017   Chondromalacia of left knee 11/29/2017   Migraines 09/23/2017   Anxiety 09/23/2017   Depression 09/23/2017   Pacemaker 02/58/5277   Chronic systolic heart failure ([place]) 04/28/2015   UTI (urinary tract infection) 03/22/2015   Sepsis ([place]) 03/19/2015  Acute kidney injury ([place]) 03/19/2015   Diarrhea 03/18/2015   CAP (community acquired pneumonia) 03/18/2015   Nausea vomiting and diarrhea 03/18/2015   Compression fracture 06/05/2014   Essential hypertension 03/27/2014   Chest pain 01/27/2014   Normal coronary arteries 01/27/2014   Obesity (BMI 30-39.9)    ICD- MDT implanted Dec 2013 03/26/2013   NICM (nonischemic cardiomyopathy) ([place]) 02/21/2013   GERD (gastroesophageal reflux disease) 04/23/2012   Asthma in adult, mild persistent, uncomplicated 37/29/0211   Shortness of breath 04/23/2012   Syncope 03/01/2012   Acute on chronic systolic CHF (congestive heart failure) ([place]) 03/01/2012   Type B WPW  syndrome- RFA Dec 2013 03/01/2012    Past Surgical History:  Procedure Laterality Date   CARDIAC CATHETERIZATION  09/18/11   no significant CAD   CARDIAC DEFIBRILLATOR PLACEMENT  03/22/2012   DDD   CHOLECYSTECTOMY  ~ 2003   IMPLANTABLE CARDIOVERTER DEFIBRILLATOR IMPLANT N/A 03/22/2012   Procedure: IMPLANTABLE CARDIOVERTER DEFIBRILLATOR IMPLANT;  Surgeon: Evans Lance, MD;  Location: [facility] CATH LAB;  Service: Cardiovascular;  Laterality: N/A;   KNEE ARTHROSCOPY Left 11/29/2017   Procedure: ARTHROSCOPY LEFT KNEE;  Surgeon: Dorna Leitz, MD;  Location: [facility] ORS;  Service: Orthopedics;  Laterality: Left;   KNEE ARTHROSCOPY Right 08/09/2019   Procedure: ARTHROSCOPY RIGHT KNEE, CHONDROPLASTY,  PARTIAL MEDIAL  MENSICECTOMY;  Surgeon: Dorna Leitz, MD;  Location: [facility] ORS;  Service: Orthopedics;  Laterality: Right;   KYPHOPLASTY N/A 11/27/2013   Procedure: KYPHOPLASTY;  Surgeon: Sinclair Ship, MD;  Location: [facility];  Service: Orthopedics;  Laterality: N/A;  T9, T11 kyphoplasty   KYPHOPLASTY N/A 06/05/2014   Procedure: KYPHOPLASTY;  Surgeon: Sinclair Ship, MD;  Location: [facility];  Service: Orthopedics;  Laterality: N/A;  T7 kyphoplasty   MOHS SURGERY  06/2011   "forehead" (03/22/2012)   [place]   "top of my head and my right back shoulder"   SKIN CANCER EXCISION     back   SKIN CANCER EXCISION     face   SUPRAVENTRICULAR TACHYCARDIA ABLATION  03/22/2012   unable to induce VT/RN report 03/22/2012   SUPRAVENTRICULAR TACHYCARDIA ABLATION N/A 03/22/2012   Procedure: SUPRAVENTRICULAR TACHYCARDIA ABLATION;  Surgeon: Evans Lance, MD;  Location[facility] C[facility] LAB;  Service: Cardiovascular;  Laterality: N/A;   TONSILLECTOMY AND ADENOIDECTOMY  1950   TUBAL LIGATION  1978     OB History   No obstetric history on file.     Family History  Problem Relation Age of Onset   Congestive Heart Failure Brother    Atrial fibrillation Brother    Congestive Heart Failure Mother         died 6   Kidney Stones Mother    Deafness Mother    Atrial fibrillation Mother    Congestive Heart Failure Father    Deafness Father    Healthy Sister    Healthy Sister    Healthy Sister    Tuberculosis Paternal Grandfather     Social History   Tobacco Use   Smoking status: Never   Smokeless tobacco: Never  Vaping Use   Vaping Use: Never used  Substance Use Topics   Alcohol use: Not Currently    Comment: rare   Drug use: No    Home Medications Prior to Admission medications   Medication Sig Start Date End Date Taking? Authorizing Provider  acetaminophen (TYLENOL) 500 MG tablet Take 500 mg by mouth in the morning and at bedtime.    [provider]  albuterol (VENTOLIN HFA) 108 (90 Base) MCG/ACT inhaler Inhale 2 puffs into the lungs every 6 (six) hours as needed for wheezing. For wheezing. Use 2 puffs 3 times daily x5 days and then every 6 hours as needed. 01/14/20   Mannam, Hart Robinsons, MD  alendronate (FOSAMAX) 70 MG tablet Take 70 mg by mouth every Sunday.     [provider]  carvedilol (COREG) 12.5 MG tablet TAKE 1 TABLET BY MOUTH TWICE DAILY WITH A MEAL 10/23/20   Evans Lance, MD  cetirizine (ZYRTEC) 10 MG tablet Take 10 mg by mouth daily.    [provider]  Cholecalciferol (VITAMIN D3) 5000 units CAPS Take 5,000 Units by mouth daily.    [provider]  citalopram (CELEXA) 20 MG tablet Take 20 mg by mouth daily.     [provider]  Cyanocobalamin (VITAMIN B-12) 5000 MCG LOZG Take 5,000 mcg by mouth daily.    [provider]  ELIQUIS 5 MG TABS tablet Take 1 tablet by mouth twice daily 09/10/20   Evans Lance, MD  erythromycin ophthalmic ointment Place a 1/2 inch ribbon of ointment into the lower eyelid. 09/11/20   Varney Biles, MD  Fluorouracil (TOLAK) 4 % CREA Apply to nost qhs Monday - Sunday x 2 weeks- avoid sunlight 07/08/20   Sheffield, Vida Roller R, PA-C  fluticasone (FLONASE) 50 MCG/ACT nasal spray Place 2 sprays  into both nostrils daily.    [provider]  fluticasone furoate-vilanterol (BREO ELLIPTA) 200-25 MCG/INH AEPB Inhale 1 puff by mouth once daily 06/11/20   Mannam, Praveen, MD  furosemide (LASIX) 40 MG tablet TAKE 1 TO 2 TABLETS BY MOUTH AS DIRECTED ONCE DAILY FOR EDEMA, SHORTNESS OF BREATH OR WEIGHT GAIN 05/07/20   Jettie Booze, MD  hydroxypropyl methylcellulose / hypromellose (ISOPTO TEARS / GONIOVISC) 2.5 % ophthalmic solution Place 1 drop into the right eye 4 (four) times daily as needed for dry eyes. 09/11/20   Varney Biles, MD  ipratropium-albuterol (DUONEB) 0.5-2.5 (3) MG/3ML SOLN Take 3 mLs by nebulization every 4 (four) hours as needed. 10/09/17   Lauraine Rinne, NP  losartan (COZAAR) 25 MG tablet Take 1 tablet by mouth twice daily 04/17/20   Jettie Booze, MD  Multiple Vitamins-Minerals (CENTRUM SILVER 50+WOMEN) TABS Take 1 tablet by mouth daily with breakfast.     [provider]  pantoprazole (PROTONIX) 40 MG tablet Take 40 mg by mouth daily. 08/29/17   [provider]  predniSONE (DELTASONE) 50 MG tablet Take one tablet by mouth 13 hours, 7 hours, and 1 hour before CT. 06/09/20   Jettie Booze, MD  spironolactone (ALDACTONE) 25 MG tablet Take 1 tablet by mouth once daily 05/14/20   Jettie Booze, MD    Allergies    Ivp dye [iodinated diagnostic agents], Penicillins, Sulfonamide derivatives, Tessalon [benzonatate], Sulfa antibiotics, Rosuvastatin calcium, and Onion  Review of Systems   Review of Systems  Constitutional:  Negative for chills and fever.  HENT:  Negative for ear pain and sore throat.   Eyes:  Positive for blurred vision. Negative for photophobia, pain and visual disturbance.  Respiratory:  Negative for cough and shortness of breath.   Cardiovascular:  Negative for chest pain and palpitations.  Gastrointestinal:  Negative for abdominal pain, nausea and vomiting.  Genitourinary:  Negative for dysuria and hematuria.   Musculoskeletal:  Negative for arthralgias, back pain, neck pain and neck stiffness.  Skin:  Negative for color change and rash.  Neurological:  Positive for headaches. Negative for dizziness, focal weakness, seizures, syncope, numbness, paresthesias and loss of balance.  All other systems reviewed and are negative.  Physical Exam Updated Vital Signs BP 135/69   Pulse 88   Temp 98.2 F (36.8 C) (Oral)   Resp 18   SpO2 94%   Physical Exam Vitals and nursing note reviewed.  HENT:     Head: Normocephalic and atraumatic.     Comments: Tender to palpation over the right temporal artery    Right Ear: Tympanic membrane, ear canal and external ear normal.     Left Ear: Tympanic membrane, ear canal and external ear normal.     Nose: Nose normal.  Eyes:     General: No scleral icterus.    Extraocular Movements: Extraocular movements intact.     Conjunctiva/sclera: Conjunctivae normal.     Pupils: Pupils are equal, round, and reactive to light.  Pulmonary:     Effort: Pulmonary effort is normal. No respiratory distress.  Musculoskeletal:        General: No deformity or signs of injury.     Cervical back: Normal range of motion.  Skin:    General: Skin is warm and dry.  Neurological:     General: No focal deficit present.     Mental Status: She is alert and oriented to person, place, and time.     Cranial Nerves: No cranial nerve deficit.     Sensory: No sensory deficit.     Motor: No weakness.     Coordination: Coordination normal.  Psychiatric:        Mood and Affect: Mood normal.    ED Results / Procedures / Treatments   Labs (all labs ordered are listed, but only abnormal results are displayed) Labs Reviewed  CBC WITH DIFFERENTIAL/PLATELET - Abnormal; Notable for the following components:      Result Value   Eosinophils Absolute 0.7 (*)    All other components within normal limits  COMPREHENSIVE METABOLIC PANEL - Abnormal; Notable for the following components:    Glucose, Bld 122 (*)    All other components within normal limits  C-REACTIVE PROTEIN - Abnormal; Notable for the following components:   CRP 1.0 (*)    All other components within normal limits  SEDIMENTATION RATE    EKG None  Radiology CT Head Wo Contrast  Result Date: 10/29/2020 CLINICAL DATA:  Headache for several months. EXAM: CT HEAD WITHOUT CONTRAST TECHNIQUE: Contiguous axial images were obtained from the base of the skull through the vertex without intravenous contrast. COMPARISON:  CT head 12/05/2019, CT head 05/04/2018 FINDINGS: Brain: No evidence of large-territorial acute infarction. No parenchymal hemorrhage. No mass lesion. No extra-axial collection. No mass effect or midline shift. No hydrocephalus. Basilar cisterns are patent. Vascular: No hyperdense vessel. Skull: Stable asymmetrically enlarged left jugular foramen. Hyperostosis frontalis interna. No acute fracture or focal lesion. Sinuses/Orbits: Paranasal sinuses and mastoid air cells are clear. The orbits are unremarkable. Other: None. IMPRESSION: No acute intracranial abnormality. Electronically Signed   By: Iven Finn M.D.   On: 10/29/2020 17:29    Procedures Procedures   Medications Ordered in ED Medications - No data to display  ED Course  I have reviewed the triage vital signs and the nursing notes.  Pertinent labs & imaging results that were available during my care of the patient were reviewed by me and considered in my medical decision making (see chart for details).    MDM Rules/Calculators/A&P  Casandra Doffing presented with double months of a right-sided headache.  She was referred by ophthalmology who was concerned for temporal arteritis.  She was evaluated here with imaging and labs.  ESR was reassuring and is not suggestive of this pathology.  Based on her description of the symptoms and their location, I would question possible trigeminal neuralgia.  I have asked her to  follow with neurology for further diagnosis and treatment.  She is otherwise well-appearing, lacks acute neurologic deficit, and is suitable for discharge home. Final Clinical Impression(s) / ED Diagnoses Final diagnoses:  Nonintractable headache, unspecified chronicity pattern, unspecified headache type  Trigeminal neuralgia of right side of face    Rx / DC Orders ED Discharge Orders     None        Arnaldo Natal, MD 10/29/20 2007

## 2020-11-05 ENCOUNTER — Ambulatory Visit (INDEPENDENT_AMBULATORY_CARE_PROVIDER_SITE_OTHER): Payer: Medicare HMO | Admitting: Neurology

## 2020-11-05 ENCOUNTER — Encounter: Payer: Self-pay | Admitting: Neurology

## 2020-11-05 VITALS — BP 122/68 | HR 79 | Ht 64.0 in | Wt 206.0 lb

## 2020-11-05 DIAGNOSIS — R6884 Jaw pain: Secondary | ICD-10-CM | POA: Diagnosis not present

## 2020-11-05 DIAGNOSIS — R519 Headache, unspecified: Secondary | ICD-10-CM | POA: Diagnosis not present

## 2020-11-05 DIAGNOSIS — G501 Atypical facial pain: Secondary | ICD-10-CM

## 2020-11-05 MED ORDER — PREDNISONE 20 MG PO TABS: 20.0000 mg | ORAL_TABLET | Freq: Every day | ORAL | 0 refills | Status: DC

## 2020-11-05 MED ORDER — GABAPENTIN 100 MG PO CAPS: 100.0000 mg | ORAL_CAPSULE | Freq: Three times a day (TID) | ORAL | 1 refills | Status: DC | PRN

## 2020-11-05 NOTE — Patient Instructions (Addendum)
Ct of head/face Nerve blocks today to see if it helps Start low dose of gabapentin as needed A few days of steroids to help with inflammation    Trigeminal Neuralgia  Trigeminal neuralgia is a nerve disorder that causes severe pain on one side of the face. The pain may last from a few seconds to several minutes. The pain isusually only on one side of the face. Symptoms may occur for days, weeks, or months and then go away for months oryears. The pain may return and be worse than before. What are the causes? This condition is caused by damage or pressure to a nerve in the head that is called the trigeminal nerve. An attack can be triggered by: Talking. Chewing. Putting on makeup. Washing your face. Shaving your face. Brushing your teeth. Touching your face. What increases the risk? You are more likely to develop this condition if you: Are 79 years of age or older. Are female. What are the signs or symptoms? The main symptom of this condition is severe pain in the: Jaw. Lips. Eyes. Nose. Scalp. Forehead. Face. The pain may be: Intense. Stabbing. Electric. Shock-like. How is this diagnosed? This condition is diagnosed with a physical exam. A CT scan or an MRI may bedone to rule out other conditions that can cause facial pain. How is this treated? This condition may be treated with: Avoiding the things that trigger your symptoms. Taking prescription medicines (anticonvulsants). Having surgery. This may be done in severe cases if other medical treatment does not provide relief. Having procedures such as ablation, thermal, or radiation therapy. It may take up to one month for treatment to start relieving the pain. Follow these instructions at home: Managing pain Learn as much as you can about how to manage your pain. Ask your health care provider if a pain specialist would be helpful. Consider talking with a mental health care provider (psychologist) about how to cope with  the pain. Consider joining a pain support group. General instructions Take over-the-counter and prescription medicines only as told by your health care provider. Avoid the things that trigger your symptoms. It may help to: Chew on the unaffected side of your mouth. Avoid touching your face. Avoid blasts of hot or cold air. Follow your treatment plan as told by your health care provider. This may include: Cognitive or behavioral therapy. Gentle, regular exercise. Meditation or yoga. Aromatherapy. Keep all follow-up visits as told by your health care provider. You may need to be monitored closely to make sure treatment is working well for you. Where to find more information Facial Pain Association: fpa-support.org Contact a health care provider if: Your medicine is not helping your symptoms. You have side effects from the medicine used for treatment. You develop new, unexplained symptoms, such as: Double vision. Facial weakness. Facial numbness. Changes in hearing or balance. You feel depressed. Get help right away if: Your pain is severe and is not getting better. You develop suicidal thoughts. If you ever feel like you may hurt yourself or others, or have thoughts about taking your own life, get help right away. You can go to your nearest emergency department or call: Your local emergency services (911 in the [place] A suicide crisis helpline, such as the [facility] at (220)232-3482. This is open 24 hours a day. Summary Trigeminal neuralgia is a nerve disorder that causes severe pain on one side of the face. The pain may last from a few seconds to several minutes. This condition  is caused by damage or pressure to a nerve in the head that is called the trigeminal nerve. Treatment may include avoiding the things that trigger your symptoms, taking medicines, or having surgery or procedures. It may take up to one month for treatment to start relieving the  pain. Avoid the things that trigger your symptoms. Keep all follow-up visits as told by your health care provider. You may need to be monitored closely to make sure treatment is working well for you. This information is not intended to replace advice given to you by your health care provider. Make sure you discuss any questions you have with your healthcare provider. Document Revised: 02/12/2018 Document Reviewed: 02/12/2018 Elsevier Patient Education  [place].  Prednisone tablets What is this medication? PREDNISONE (PRED ni sone) is a corticosteroid. It is commonly used to treat inflammation of the skin, joints, lungs, and other organs. Common conditions treated include asthma, allergies, and arthritis. It is also used for otherconditions, such as blood disorders and diseases of the adrenal glands. This medicine may be used for other purposes; ask your health care provider orpharmacist if you have questions. COMMON BRAND NAME(S): Deltasone, Predone, Sterapred, Sterapred DS What should I tell my care team before I take this medication? They need to know if you have any of these conditions: Cushing's syndrome diabetes glaucoma heart disease high blood pressure infection (especially a virus infection such as chickenpox, cold sores, or herpes) kidney disease liver disease mental illness myasthenia gravis osteoporosis seizures stomach or intestine problems thyroid disease an unusual or allergic reaction to lactose, prednisone, other medicines, foods, dyes, or preservatives pregnant or trying to get pregnant breast-feeding How should I use this medication? Take this medicine by mouth with a glass of water. Follow the directions on the prescription label. Take this medicine with food. If you are taking this medicine once a day, take it in the morning. Do not take more medicine than you are told to take. Do not suddenly stop taking your medicine because you may develop a severe  reaction. Your doctor will tell you how much medicine to take. If your doctor wants you to stop the medicine, the dose may be slowly loweredover time to avoid any side effects. Talk to your pediatrician regarding the use of this medicine in children.Special care may be needed. Overdosage: If you think you have taken too much of this medicine contact apoison control center or emergency room at once. NOTE: This medicine is only for you. Do not share this medicine with others. What if I miss a dose? If you miss a dose, take it as soon as you can. If it is almost time for your next dose, talk to your doctor or health care professional. You may need to miss a dose or take an extra dose. Do not take double or extra doses withoutadvice. What may interact with this medication? Do not take this medicine with any of the following medications: metyrapone mifepristone This medicine may also interact with the following medications: aminoglutethimide amphotericin B aspirin and aspirin-like medicines barbiturates certain medicines for diabetes, like glipizide or glyburide cholestyramine cholinesterase inhibitors cyclosporine digoxin diuretics ephedrine female hormones, like estrogens and birth control pills isoniazid ketoconazole NSAIDS, medicines for pain and inflammation, like ibuprofen or naproxen phenytoin rifampin toxoids vaccines warfarin This list may not describe all possible interactions. Give your health care provider a list of all the medicines, herbs, non-prescription drugs, or dietary supplements you use. Also tell them if you smoke, drink  alcohol, or use illegaldrugs. Some items may interact with your medicine. What should I watch for while using this medication? Visit your doctor or health care professional for regular checks on your progress. If you are taking this medicine over a prolonged period, carry an identification card with your name and address, the type and dose of  yourmedicine, and your doctor's name and address. This medicine may increase your risk of getting an infection. Tell your doctor or health care professional if you are around anyone with measles orchickenpox, or if you develop sores or blisters that do not heal properly. If you are going to have surgery, tell your doctor or health care professionalthat you have taken this medicine within the last twelve months. Ask your doctor or health care professional about your diet. You may need tolower the amount of salt you eat. This medicine may increase blood sugar. Ask your healthcare provider if changesin diet or medicines are needed if you have diabetes. What side effects may I notice from receiving this medication? Side effects that you should report to your doctor or health care professionalas soon as possible: allergic reactions like skin rash, itching or hives, swelling of the face, lips, or tongue changes in emotions or moods changes in vision depressed mood eye pain fever or chills, cough, sore throat, pain or difficulty passing urine signs and symptoms of high blood sugar such as being more thirsty or hungry or having to urinate more than normal. You may also feel very tired or have blurry vision. swelling of ankles, feet Side effects that usually do not require medical attention (report to yourdoctor or health care professional if they continue or are bothersome): confusion, excitement, restlessness headache nausea, vomiting skin problems, acne, thin and shiny skin trouble sleeping weight gain This list may not describe all possible side effects. Call your doctor for medical advice about side effects. You may report side effects to FDA at1-800-FDA-1088. Where should I keep my medication? Keep out of the reach of children. Store at room temperature between 15 and 30 degrees C (59 and 86 degrees F). Protect from light. Keep container tightly closed. Throw away any unusedmedicine after the  expiration date. NOTE: This sheet is a summary. It may not cover all possible information. If you have questions about this medicine, talk to your doctor, pharmacist, orhealth care provider.  2022 Elsevier/Gold Standard (2017-12-26 10:54:22)

## 2020-11-05 NOTE — Progress Notes (Signed)
[facility]    Provider:  Dr Jaynee Eagles Requesting Provider: Arnaldo Natal, MD Primary Care Provider:  [name], MD  CC:  facial pain  HPI:  Paula Booker is a 75 y.o. female here as requested by Arnaldo Natal, MD for trigeminal neuralgia. PMHx congestive heart failure, migraines, hypertension, A. fib, asthma, obstructive sleep apnea, UTI, AKI, syncope, anxiety, pacemaker, ICD, depression, obesity.  I reviewed emergency room notes, she presented for facial pain, notes stated daily headache severe for several months, sometimes so bad she cries, right temporal region and does not radiate, she has some intermittent drooping of her right eye when this is happening, she has had some gray vision in the eye, she was seen by ophthalmology had a full eye exam and was sent for possible temporal arteritis.  They noted some tenderness to palpation over the right temporal artery.  ESR and CRP were reassuring and not suggestive of this pathology per notes and indeed CRP and sed rate were normal, they question possible trigeminal neuralgia instead.  Neurologic exam was nonfocal.  CRP was 1, sed rate was completely normal at 17.  Started this past spring, it is just on the right above the ear, about a quarter size circle area, hurts when she touches it, opening nd closing the mouth may hurt it, most of the time very localized, throbbing, major pain, sensitive to the touch, radiates to the back of the head (points to the occipital region) and really hurts, hurts so bad her right eye closes, sleeping makes it better, sensitive to the touch just a pain like a sore and she pushed in it, will shoot into the eye, also feels it in the right temple, no rash when it started, extra strength tylenol does not help. No bumps or rashes just very painful. Just on the right, happening every day, multiple times a day, also shoots to the top of the head, her head I very ensitive on the right. No light or sound  sensitivity, no nuasea, not migraines, mire nerve pain.   Reviewed notes, labs and imaging from outside physicians, which showed:  CT Head 10/29/2020 showed No acute intracranial abnormalities including mass lesion or mass effect, hydrocephalus, extra-axial fluid collection, midline shift, hemorrhage, or acute infarction, large ischemic events or anything to explain symptoms. (personally reviewed images)    Review of Systems: Patient complains of symptoms per HPI as well as the following symptoms migraines. Pertinent negatives and positives per HPI. All others negative.   Social History   Socioeconomic History   Marital status: Divorced    Spouse name: Not on file   Number of children: 3   Years of education: Not on file   Highest education level: Not on file  Occupational History   Occupation: retired    Comment: Optometrist  Tobacco Use   Smoking status: Never   Smokeless tobacco: Never  Vaping Use   Vaping Use: Never used  Substance and Sexual Activity   Alcohol use: Not Currently    Comment: rare   Drug use: No   Sexual activity: Not Currently  Other Topics Concern   Not on file  Social History Narrative   Divorced, 3 children, 9 grandchildren. Did work for [employer]. Retired at 75 y/o .   Social Determinants of Health   Financial Resource Strain: Not on file  Food Insecurity: Not on file  Transportation Needs: Not on file  Physical Activity: Not on file  Stress: Not on file  Social Connections: Not on file  Intimate Partner Violence: Not on file    Family History  Problem Relation Age of Onset   Congestive Heart Failure Mother        died 22   Kidney Stones Mother    Deafness Mother    Atrial fibrillation Mother    Congestive Heart Failure Father    Deafness Father    Healthy Sister    Healthy Sister    Healthy Sister    Congestive Heart Failure Brother    Atrial fibrillation Brother    Tuberculosis Paternal Grandfather    Migraines Neg Hx     Neural tube defect Neg Hx     Past Medical History:  Diagnosis Date   AICD (automatic cardioverter/defibrillator) present    Anemia    as a child   Anxiety    Arthritis    "hands and knees" (03/22/2012)   Asthma    Basal cell carcinoma 04/27/2011   mid forehead(MOHS)   Bursitis    CHF (congestive heart failure) ([place]) 09/2011   pt. denies   Depression    Dysrhythmia    WPW   GERD (gastroesophageal reflux disease)    Head injury, acute, with loss of consciousness ([place])    History of hiatal hernia    Hypertension    ICD (implantable cardiac defibrillator) in place 03/2012   Migraines    migraines    NICM (nonischemic cardiomyopathy) ([place])    Obesity (BMI 30-39.9)    negative sleep study 2014   OSA (obstructive sleep apnea) 05/27/2018   Mild with AHI 6/hr    no cpap   Pneumonia    hx   Shortness of breath    "@ any time I can get SOB" (03/22/2012)   Skin cancer 1999   Squamous cell carcinoma of skin 07/08/2020   in situ right malar cheek-[id number]   Sudden arrhythmia death syndrome    WPW (Wolff-Parkinson-White syndrome)     Patient Active Problem List   Diagnosis Date Noted   Paroxysmal atrial fibrillation ([place]) 08/20/2019   Acute medial meniscus tear of right knee 08/09/2019   Chondromalacia, right knee 08/09/2019   SVT (supraventricular tachycardia) ([place]) 11/02/2018   ICD (implantable cardioverter-defibrillator) in place 11/02/2018   OSA (obstructive sleep apnea) 05/27/2018   Acute medial meniscus tear, left, initial encounter 11/29/2017   Chondromalacia of left knee 11/29/2017   Migraines 09/23/2017   Anxiety 09/23/2017   Depression 09/23/2017   Pacemaker 74/25/9563   Chronic systolic heart failure ([place]) 04/28/2015   UTI (urinary tract infection) 03/22/2015   Sepsis ([place]) 03/19/2015   Acute kidney injury ([place]) 03/19/2015   Diarrhea 03/18/2015   CAP (community acquired pneumonia) 03/18/2015   Nausea vomiting and diarrhea 03/18/2015   Compression fracture  06/05/2014   Essential hypertension 03/27/2014   Chest pain 01/27/2014   Normal coronary arteries 01/27/2014   Obesity (BMI 30-39.9)    ICD- MDT implanted Dec 2013 03/26/2013   NICM (nonischemic cardiomyopathy) ([place]) 02/21/2013   GERD (gastroesophageal reflux disease) 04/23/2012   Asthma in adult, mild persistent, uncomplicated 87/56/4332   Shortness of breath 04/23/2012   Syncope 03/01/2012   Acute on chronic systolic CHF (congestive heart failure) ([place]) 03/01/2012   Type B WPW syndrome- RFA Dec 2013 03/01/2012    Past Surgical History:  Procedure Laterality Date   CARDIAC CATHETERIZATION  09/18/11   no significant CAD   CARDIAC DEFIBRILLATOR PLACEMENT  03/22/2012   DDD   CHOLECYSTECTOMY  ~ 2003  IMPLANTABLE CARDIOVERTER DEFIBRILLATOR IMPLANT N/A 03/22/2012   Procedure: IMPLANTABLE CARDIOVERTER DEFIBRILLATOR IMPLANT;  Surgeon: Evans Lance, MD;  Location[facility] CATH LAB;  Service: Cardiovascular;  Laterality: N/A;   KNEE ARTHROSCOPY Left 11/29/2017   Procedure: ARTHROSCOPY LEFT KNEE;  Surgeon: Dorna Leitz, MD;  Location: [facility] ORS;  Service: Orthopedics;  Laterality: Left;   KNEE ARTHROSCOPY Right 08/09/2019   Procedure: ARTHROSCOPY RIGHT KNEE, CHONDROPLASTY,  PARTIAL MEDIAL  MENSICECTOMY;  Surgeon: Dorna Leitz, MD;  Location: [facility] ORS;  Service: Orthopedics;  Laterality: Right;   KYPHOPLASTY N/A 11/27/2013   Procedure: KYPHOPLASTY;  Surgeon: Sinclair Ship, MD;  Location: [place];  Service: Orthopedics;  Laterality: N/A;  T9, T11 kyphoplasty   KYPHOPLASTY N/A 06/05/2014   Procedure: KYPHOPLASTY;  Surgeon: Sinclair Ship, MD;  Location: [place];  Service: Orthopedics;  Laterality: N/A;  T7 kyphoplasty   MOHS SURGERY  06/2011   "forehead" (03/22/2012)   [place]   "top of my head and my right back shoulder"   SKIN CANCER EXCISION     back   SKIN CANCER EXCISION     face   SUPRAVENTRICULAR TACHYCARDIA ABLATION  03/22/2012   unable to induce VT/RN report  03/22/2012   SUPRAVENTRICULAR TACHYCARDIA ABLATION N/A 03/22/2012   Procedure: SUPRAVENTRICULAR TACHYCARDIA ABLATION;  Surgeon: Evans Lance, MD;  Location: [facility] CATH LAB;  Service: Cardiovascular;  Laterality: N/A;   TONSILLECTOMY AND ADENOIDECTOMY  1950   TUBAL LIGATION  1978    Current Outpatient Medications  Medication Sig Dispense Refill   acetaminophen (TYLENOL) 500 MG tablet Take 500 mg by mouth in the morning and at bedtime.     albuterol (VENTOLIN HFA) 108 (90 Base) MCG/ACT inhaler Inhale 2 puffs into the lungs every 6 (six) hours as needed for wheezing. For wheezing. Use 2 puffs 3 times daily x5 days and then every 6 hours as needed. 18 g 5   alendronate (FOSAMAX) 70 MG tablet Take 70 mg by mouth every Sunday.      Calcium Carbonate (CALCIUM 500 PO) Take by mouth. One tablet daily.     carvedilol (COREG) 12.5 MG tablet TAKE 1 TABLET BY MOUTH TWICE DAILY WITH A MEAL 180 tablet 1   cetirizine (ZYRTEC) 10 MG tablet Take 10 mg by mouth daily.     Cholecalciferol (VITAMIN D3) 5000 units CAPS Take 5,000 Units by mouth daily.     Cyanocobalamin (VITAMIN B-12) 5000 MCG LOZG Take 5,000 mcg by mouth daily.     ELIQUIS 5 MG TABS tablet Take 1 tablet by mouth twice daily 180 tablet 1   erythromycin ophthalmic ointment Place a 1/2 inch ribbon of ointment into the lower eyelid. 3.5 g 0   escitalopram (LEXAPRO) 20 MG tablet Take 20 mg by mouth daily.     Fluorouracil (TOLAK) 4 % CREA Apply to nost qhs Monday - Sunday x 2 weeks- avoid sunlight 40 g 1   fluticasone (FLONASE) 50 MCG/ACT nasal spray Place 2 sprays into both nostrils daily.     fluticasone furoate-vilanterol (BREO ELLIPTA) 200-25 MCG/INH AEPB Inhale 1 puff by mouth once daily 60 each 5   furosemide (LASIX) 40 MG tablet TAKE 1 TO 2 TABLETS BY MOUTH AS DIRECTED ONCE DAILY FOR EDEMA, SHORTNESS OF BREATH OR WEIGHT GAIN 180 tablet 3   gabapentin (NEURONTIN) 100 MG capsule Take 1 capsule (100 mg total) by mouth 3 (three) times daily as  needed. 90 capsule 1   hydroxypropyl methylcellulose / hypromellose (ISOPTO TEARS /  GONIOVISC) 2.5 % ophthalmic solution Place 1 drop into the right eye 4 (four) times daily as needed for dry eyes. 15 mL 0   ipratropium-albuterol (DUONEB) 0.5-2.5 (3) MG/3ML SOLN Take 3 mLs by nebulization every 4 (four) hours as needed. 360 mL 1   losartan (COZAAR) 25 MG tablet Take 1 tablet by mouth twice daily 180 tablet 3   Multiple Vitamins-Minerals (CENTRUM SILVER 50+WOMEN) TABS Take 1 tablet by mouth daily with breakfast.      pantoprazole (PROTONIX) 40 MG tablet Take 40 mg by mouth daily.  0   predniSONE (DELTASONE) 50 MG tablet Take 1 pill 13 hours, 7 hours  and 1 hour before CT contrast for a total of 3 doses of prednisone. Take 78m of Benadryl with the prednisone 1 hour before CT for a total of one dose of benadryl. Do not drive. 3 tablet 0   spironolactone (ALDACTONE) 25 MG tablet Take 1 tablet by mouth once daily 90 tablet 3   No current facility-administered medications for this visit.    Allergies as of 11/05/2020 - Review Complete 11/05/2020  Allergen Reaction Noted   Ivp dye [iodinated diagnostic agents] Hives and Other (See Comments) 03/07/2012   Penicillins Anaphylaxis 09/12/2011   Sulfonamide derivatives Other (See Comments) 09/12/2011   Tessalon [benzonatate] Hives and Rash 04/03/2015   Sulfa antibiotics Other (See Comments) 10/16/2014   Rosuvastatin calcium Other (See Comments) 03/17/2011   Onion Other (See Comments) 04/23/2012    Vitals: BP 122/68   Pulse 79   Ht _0  (1.626 m)   Wt 206 lb (93.4 kg)   BMI 35.36 kg/m  Last Weight:  Wt Readings from Last 1 Encounters:  11/05/20 206 lb (93.4 kg)   Last Height:   Ht Readings from Last 1 Encounters:  11/05/20 _1  (1.626 m)     Physical exam: Exam: Gen: NAD, conversant, well nourised, obese, well groomed                     CV: RRR, no MRG. No Carotid Bruits. No peripheral edema, warm, nontender Eyes: Conjunctivae clear  without exudates or hemorrhage MSK: pain to palpation in a quarter-sized area right above and slightly anterior to the helix of the ear.   Neuro: Detailed Neurologic Exam  Speech:    Speech is normal; fluent and spontaneous with normal comprehension.  Cognition:    The patient is oriented to person, place, and time;     recent and remote memory intact;     language fluent;     normal attention, concentration,     fund of knowledge Cranial Nerves:    The pupils are equal, round, and reactive to light. Pupils too small to visualize fundi.  Visual fields are full to finger confrontation. Extraocular movements are intact. Trigeminal sensation is intact and the muscles of mastication are normal. The face is symmetric. The palate elevates in the midline. Hearing intact. Voice is normal. Shoulder shrug is normal. The tongue has normal motion without fasciculations.   Coordination:    Normal    Gait:    Slightly antalgic  Motor Observation:    No asymmetry, no atrophy, and no involuntary movements noted. Tone:    Normal muscle tone.    Posture:    Posture is normal. normal erect    Strength:    Strength is V/V in the upper and lower limbs.      Sensation: intact to LT     Reflex Exam:  DTR's:    Ajs and biceps 2+   Toes:    The toes are downgoing bilaterally.   Clonus:    Clonus is absent.    Assessment/Plan: This is a lovely 75 year old patient with very unusual headache, tenderness and pain on palpation in a quarter-sized area right above the pinna of her ear (trigeminal nerve?) and it may radiate to the eye but it all may also may radiate to the occipital area and up to the parietal area.  Seems to encompass both occipital and trigeminal nerve areas.  She denies any migrainous symptoms. ESR/CRP completely normal. Severe very localized pain to palpation.  Not classical trigeminal neuralgia or classical occipital neuralgia.  She cannot get an MRI unfortunately.  No rashes or  lesions.  Examined the area ehich appears to be normal (no lesions, redness/rashes)  -We will try a nerve block and see if that helps/lidocaine and marcaine topically .-We will try 5 days of steroids, she says she tolerates steroids well, I worry about steroids in a 75 year old however she says that she does fine with them and several days of steroids may help with any neuritis that may be going on. -Usually Trileptal and Tegretol are first-line for trigeminal neuralgia-like pain however this is really atypical nerve pain and I hate to use those in the elderly due to side effects we will give her a small dose of gabapentin and see if that helps. -I will order a maxillofacial CT ensure there are no bone or other focal lesions in the right jaw/ear area.  CT of the head did not show any intracranial issues, but need to examine the tissue and bone more locally on the right side. - allergic to contrast but states she can do it with pre-treatments, discussed prednisone and benadryl protocol and she is familiar with it.   Orders Placed This Encounter  Procedures   CT MAXILLOFACIAL W & WO CONTRAST    Meds ordered this encounter  Medications   DISCONTD: predniSONE (DELTASONE) 20 MG tablet    Sig: Take 1 tablet (20 mg total) by mouth daily. For 5 days for nerve pain. Take in the morning with food.    Dispense:  5 tablet    Refill:  0   gabapentin (NEURONTIN) 100 MG capsule    Sig: Take 1 capsule (100 mg total) by mouth 3 (three) times daily as needed.    Dispense:  90 capsule    Refill:  1   predniSONE (DELTASONE) 50 MG tablet    Sig: Take 1 pill 13 hours, 7 hours  and 1 hour before CT contrast for a total of 3 doses of prednisone. Take 75m of Benadryl with the prednisone 1 hour before CT for a total of one dose of benadryl. Do not drive.    Dispense:  3 tablet    Refill:  0    Cc: WArnaldo Natal MD,  TAura Dials MD  ASarina Ill MD  [facility] [address] SHalf MoonGManlius Head[place] [id number] Phone [phone] [phone]

## 2020-11-07 ENCOUNTER — Encounter: Payer: Self-pay | Admitting: Neurology

## 2020-11-07 MED ORDER — PREDNISONE 50 MG PO TABS: ORAL_TABLET | ORAL | 0 refills | Status: DC

## 2020-11-09 ENCOUNTER — Ambulatory Visit (INDEPENDENT_AMBULATORY_CARE_PROVIDER_SITE_OTHER): Payer: Medicare HMO

## 2020-11-09 DIAGNOSIS — I5022 Chronic systolic (congestive) heart failure: Secondary | ICD-10-CM | POA: Diagnosis not present

## 2020-11-09 DIAGNOSIS — Z95810 Presence of automatic (implantable) cardiac defibrillator: Secondary | ICD-10-CM

## 2020-11-10 ENCOUNTER — Telehealth: Payer: Self-pay

## 2020-11-10 ENCOUNTER — Telehealth: Payer: Self-pay | Admitting: *Deleted

## 2020-11-10 DIAGNOSIS — R6884 Jaw pain: Secondary | ICD-10-CM

## 2020-11-10 DIAGNOSIS — R519 Headache, unspecified: Secondary | ICD-10-CM

## 2020-11-10 DIAGNOSIS — G501 Atypical facial pain: Secondary | ICD-10-CM

## 2020-11-10 NOTE — Telephone Encounter (Signed)
I received this message from [place] at GI: "Heyy! I reached out to the patient to schedule and she stated the reaction to her allergy to iodine is hives and airway swelling. Unfortunately, because her airway swells, we cannot scan her here at [facility]. She would need to go to the hospital." I faxed the order to [facility].

## 2020-11-10 NOTE — Telephone Encounter (Signed)
Remote ICM transmission received.  Attempted call to patient regarding ICM remote transmission and mail box is full. 

## 2020-11-10 NOTE — Progress Notes (Signed)
EPIC Encounter for ICM Monitoring  Patient Name: Paula Booker is a 75 y.o. female Date: 11/10/2020 Primary Care Physican: [name], MD Primary Cardiologist: Irish Lack Electrophysiologist: Lovena Le 10/02/2020 Weight: 206 lbs   Time in AT/AF   0.0 hr/day (0.0%)         Attempted call to patient and unable to reach. Transmission reviewed.    Optivol thoracic impedance suggesting possible fluid accumulation starting 7/13 and returned close to baseline 11/09/2020.   Prescribed:  Furosemide 40 mg take 1-2 tablets (40-80 mg total) as directed once daily for edema shortness of breath or weight gain Spironolactone 25 mg take 1 tablet by mouth daily   Labs: 10/29/2020 Creatinine 0.88, BUN 12, Potassium 3.7, Sodium 139, GFR >60 05/30/2020 Creatinine 0.97, BUN 22, Potassium 4.0, Sodium 136, GFR 61 Care Everywhere A complete set of results can be found in Results Review.   Recommendations:  Unable to reach.     Follow-up plan: ICM clinic phone appointment on 12/15/2020.   91 day device clinic remote transmission 01/05/2021.     EP/Cardiology Office Visits:  Recall 03/18/2021 with Dr Lovena Le.  Recall 04/11/2021 with Dr Irish Lack.     Copy of ICM check sent to Dr. Lovena Le.   3 month ICM trend: 11/09/2020.    1 Year ICM trend:       [name], RN 11/10/2020 11:16 AM

## 2020-11-10 NOTE — Progress Notes (Addendum)
Spoke with patient.  She said she is on prednisone for nerve damage in her head and feels like that has caused her fluid accumulation.  She is taking extra Furosemide as needed based off if she has swelling of feet.  She has been walking for exercise and trying to get a lot of steps per day.  She is doing well at this time.  No changes and encouraged to call if experiencing any fluid symptoms.  Weight is 203 lbs.   She thinks she had a seizure this past weekend and will follow up with PCP.

## 2020-11-10 NOTE — Telephone Encounter (Signed)
Received a call from [place] at [facility].  I was advised they are unable to do CT maxillofacial with and without contrast.  He has to be either with or without if the provider desires to have it with contrast they can proceed with the pre-treatment (which was already ordered by Dr Jaynee Eagles).  I spoke with Dr. Leta Baptist who is covering for Dr. Jaynee Eagles this morning while she is out of the office.  He has advised to place the order with contrast.   CT maxillofacial order re-written with contrast and previous order canceled.

## 2020-11-10 NOTE — Addendum Note (Signed)
Addended by: Gildardo Griffes on: 11/10/2020 12:04 PM   Modules accepted: Orders

## 2020-11-17 DIAGNOSIS — I10 Essential (primary) hypertension: Secondary | ICD-10-CM | POA: Diagnosis not present

## 2020-11-17 DIAGNOSIS — E782 Mixed hyperlipidemia: Secondary | ICD-10-CM | POA: Diagnosis not present

## 2020-11-17 DIAGNOSIS — I480 Paroxysmal atrial fibrillation: Secondary | ICD-10-CM | POA: Diagnosis not present

## 2020-11-17 DIAGNOSIS — I428 Other cardiomyopathies: Secondary | ICD-10-CM | POA: Diagnosis not present

## 2020-11-17 DIAGNOSIS — I509 Heart failure, unspecified: Secondary | ICD-10-CM | POA: Diagnosis not present

## 2020-11-17 DIAGNOSIS — F331 Major depressive disorder, recurrent, moderate: Secondary | ICD-10-CM | POA: Diagnosis not present

## 2020-11-17 NOTE — Telephone Encounter (Signed)
Pt returning a call. Please call back.

## 2020-11-17 NOTE — Telephone Encounter (Signed)
Patient is scheduled at [facility] for Tuesday 11/24/20 to arrive at 3:45 pm. I left a voicemail for the patient to be aware of her appt. I also reminded her to the medicine protocol before having the CT.   Mcarthur Rossetti Josem Kaufmann: [id number] (exp. 11/10/20 to 12/10/20)

## 2020-11-18 ENCOUNTER — Telehealth: Payer: Self-pay | Admitting: Neurology

## 2020-11-18 NOTE — Telephone Encounter (Signed)
Called patient to do reminder call, she is looking to get a refill on Predisone before her 8/18 visit. She said that she was told to take 5 before her CT scan. Please review and let her know if it's necessary.

## 2020-11-18 NOTE — Addendum Note (Signed)
Addended by: Brandon Melnick on: 11/18/2020 05:15 PM   Modules accepted: Orders

## 2020-11-18 NOTE — Telephone Encounter (Signed)
I spoke with the patient.  She took Prednisone 20 mg daily x5 as prescribed on July 28.  She stated [facility] was [facility] that they did not have to be 50 mg of prednisone.  I told her I would look into the prednisone 50 mg prescription and we reviewed the instructions while on the phone. I called [facility] and confirmed they actually do have prednisone 50 mg however they need to get it ready for the patient and will text her when its ready.  Patient has been updated and verbalized appreciation.

## 2020-11-19 DIAGNOSIS — J449 Chronic obstructive pulmonary disease, unspecified: Secondary | ICD-10-CM | POA: Diagnosis not present

## 2020-11-19 DIAGNOSIS — H40033 Anatomical narrow angle, bilateral: Secondary | ICD-10-CM | POA: Diagnosis not present

## 2020-11-19 DIAGNOSIS — M069 Rheumatoid arthritis, unspecified: Secondary | ICD-10-CM | POA: Diagnosis not present

## 2020-11-24 ENCOUNTER — Ambulatory Visit (HOSPITAL_COMMUNITY)
Admission: RE | Admit: 2020-11-24 | Discharge: 2020-11-24 | Disposition: A | Discharge status: Home / Self Care | Payer: Medicare HMO | Source: Ambulatory Visit | Attending: Diagnostic Neuroimaging | Admitting: Diagnostic Neuroimaging

## 2020-11-24 ENCOUNTER — Encounter (HOSPITAL_COMMUNITY): Payer: Self-pay

## 2020-11-24 ENCOUNTER — Other Ambulatory Visit: Payer: Self-pay

## 2020-11-24 DIAGNOSIS — H9201 Otalgia, right ear: Secondary | ICD-10-CM | POA: Diagnosis not present

## 2020-11-24 DIAGNOSIS — R6884 Jaw pain: Secondary | ICD-10-CM | POA: Insufficient documentation

## 2020-11-24 DIAGNOSIS — G501 Atypical facial pain: Secondary | ICD-10-CM | POA: Insufficient documentation

## 2020-11-24 DIAGNOSIS — R519 Headache, unspecified: Secondary | ICD-10-CM

## 2020-11-24 DIAGNOSIS — K056 Periodontal disease, unspecified: Secondary | ICD-10-CM | POA: Diagnosis not present

## 2020-11-24 DIAGNOSIS — K089 Disorder of teeth and supporting structures, unspecified: Secondary | ICD-10-CM | POA: Diagnosis not present

## 2020-11-24 MED ORDER — IOHEXOL 350 MG/ML SOLN
75.0000 mL | Freq: Once | INTRAVENOUS | Status: AC | PRN
  Administered 2020-11-24: 75 mL via INTRAVENOUS

## 2020-11-25 ENCOUNTER — Telehealth: Payer: Self-pay | Admitting: Neurology

## 2020-11-25 NOTE — Telephone Encounter (Signed)
I spoke to patient: Not sure what to do next, told patient not to come in the office tomorrow because this is not anything I can address. Dentist? Orthodontist? Will call pcp in the morning, patient has access to the imaging results. May order the MRI of the TMJ as well, will talk to pcp, patient going to try and find a dentist to see.    1. Marked asymmetric flattening of the right mandibular condylar head with possible erosive change and relative lack of osteophytosis is suggestive of inflammatory arthropathy. This is directly subjacent to the marker at the indicated site of pain, and is likely at least contributing to the patient's symptoms. MRI of the temporomandibular joint could be considered for further evaluation if not contraindicated. 2. Extensive dental disease.

## 2020-11-26 ENCOUNTER — Ambulatory Visit: Payer: Medicare HMO | Admitting: Neurology

## 2020-11-26 ENCOUNTER — Encounter: Payer: Self-pay | Admitting: *Deleted

## 2020-11-26 NOTE — Telephone Encounter (Signed)
I spoke with the patient and discussed Dr Cathren Laine message below:  I spoke to a dentist today. Patient is going to need to see an oral surgeon and bring the CT Scan report and a copy of the CT Scan on a disk. The dentist I spoke to recommended trying Dr. Diona [phone]) or there is an urgent tooth clinic run by Dr. Rip Harbour [phone]). Have patient or daughter call them and see if they take her insurance and have appointments and if they need a referral we can provide that, the patient and daughter need to let us know. Otherwise there is nothing we can do here in neurology for this. thanks    The patient verbalized understanding and she wrote down the name and number of the dentists.  She will let us know if we need to send a referral. She also asked if she should continue Gabapentin prescribed by Dr. Jaynee Eagles for her "other issue" and advised she should continue unless she is told otherwise. Patient was very Patent attorney.

## 2020-12-07 ENCOUNTER — Other Ambulatory Visit: Payer: Self-pay | Admitting: Neurology

## 2020-12-07 DIAGNOSIS — G500 Trigeminal neuralgia: Secondary | ICD-10-CM

## 2020-12-07 NOTE — Progress Notes (Signed)
Patient referred to outpatient neurology.

## 2020-12-09 ENCOUNTER — Encounter: Payer: Self-pay | Admitting: Internal Medicine

## 2020-12-09 ENCOUNTER — Telehealth: Payer: Self-pay | Admitting: *Deleted

## 2020-12-09 NOTE — Progress Notes (Signed)
[place] DEVICE PROGRAMMING   Patient Information:  Patient: [name]  MRN: [id number]  Date of Birth: 1945-08-24      Planned Procedure:  46 TEETH TO BE EXTRACTED WITH BONE RECONTOURING  Surgeon:  URGENT TOOTH; DR. Perlie Gold, DDS Date of Procedure:  TBD    Device Information:   Clinic EP Physician:   Dr. Cristopher Peru Device Type:  Defibrillator Manufacturer and Phone #:  Medtronic: [id number] Pacemaker Dependent?:  No Date of Last Device Check:  10/06/20 (Remote)        Normal Device Function?:  Yes     Electrophysiologist's Recommendations:   Have magnet available. Provide continuous ECG monitoring when magnet is used or reprogramming is to be performed.  Procedure may interfere with device function.  Magnet should be placed over device during procedure.  Per Device Clinic Standing Orders, Simone Curia  12/09/2020 1:01 PM

## 2020-12-09 NOTE — Telephone Encounter (Signed)
Patient with diagnosis of afib on Eliquis for anticoagulation.    Procedure: 15 dental extractions with recontouring Date of procedure: TBD  CHA2DS2-VASc Score = 4  This indicates a 4.8% annual risk of stroke. The patient's score is based upon: CHF History: Yes HTN History: Yes Diabetes History: No Stroke History: No Vascular Disease History: No Age Score: 1 Gender Score: 1   CrCl 20m/min using adjusted body weight Platelet count 272K  Patient does not require pre-op antibiotics for dental procedure.  Per office protocol, patient can hold Eliquis for 2 days prior to procedure.

## 2020-12-09 NOTE — Telephone Encounter (Signed)
   [facility] Pre-operative Risk Assessment    Patient Name: Paula Booker  DOB: 1945-05-13 MRN: [id number]  HEARTCARE STAFF:  - IMPORTANT!!!!!! Under Visit Info/Reason for Call, type in Other and utilize the format Clearance MM/DD/YY or Clearance TBD. Do not use dashes or single digits. - Please review there is not already an duplicate clearance open for this procedure. - If request is for dental extraction, please clarify the # of teeth to be extracted. - If the patient is currently at the dentist's office, call Pre-Op Callback Staff (MA/nurse) to input urgent request.  - If the patient is not currently in the dentist office, please route to the Pre-Op pool.  Request for surgical clearance: NEEDS DEVICE CLEARANCE; WILL HAND OFF CLEARANCE REQUEST TO DEVICE CLINIC AS WELL AS ROUTE THIS CLEARANCE NOTE TO DEVICE CLINIC  What type of surgery is being performed? 15 TEETH TO BE EXTRACTED WITH BONE RECONTOURING  When is this surgery scheduled? TBD  What type of clearance is required (medical clearance vs. Pharmacy clearance to hold med vs. Both)? BOTH AND DEVICE CLEARANCE AS WELL   Are there any medications that need to be held prior to surgery and how long? [facility] name and name of physician performing surgery? [facility]; DR. Perlie Gold, DDS  What is the office phone number? [phone]   7.   What is the office fax number? [phone]  8.   Anesthesia type (None, local, MAC, general) ? LOCAL; LIDOCAINE WITH 2 % EPI 1:100,000   Julaine Hua 12/09/2020, 12:11 PM  _________________________________________________________________   (provider comments below)

## 2020-12-10 NOTE — Telephone Encounter (Signed)
   Patient Name: Paula Booker  DOB: December 15, 1945 MRN: [id number]  Primary Cardiologist: Larae Grooms, MD  Chart reviewed as part of pre-operative protocol coverage. Patient was last seen in 04/2020. Patient was contacted today for further pre-op evaluation. She is stable from a cardiac standpoint. Given past medical history and time since last visit, based on ACC/AHA guidelines, SHAYLEY KILBER would be at acceptable risk for the planned procedure without further cardiovascular testing.   Per Pharmacy and office protocol, patient can hold Eliquis for 2 days prior to dental procedure. Please restart this as soon as able following procedure.  SBE prophylaxis is not required for the patient from a cardiac standpoint.  I will route this recommendation to the requesting party via Epic fax function and remove from pre-op pool.  Please call with questions.  Darreld Mclean, PA-C 12/10/2020, 9:18 AM

## 2020-12-10 NOTE — Telephone Encounter (Signed)
Left message to call back and ask to speak with pre-op team.  Darreld Mclean, PA-C 12/10/2020 9:03 AM

## 2020-12-15 ENCOUNTER — Ambulatory Visit (INDEPENDENT_AMBULATORY_CARE_PROVIDER_SITE_OTHER): Payer: Medicare HMO

## 2020-12-15 DIAGNOSIS — I5022 Chronic systolic (congestive) heart failure: Secondary | ICD-10-CM | POA: Diagnosis not present

## 2020-12-15 DIAGNOSIS — Z95810 Presence of automatic (implantable) cardiac defibrillator: Secondary | ICD-10-CM | POA: Diagnosis not present

## 2020-12-16 NOTE — Progress Notes (Signed)
EPIC Encounter for ICM Monitoring  Patient Name: Paula Booker is a 75 y.o. female Date: 12/16/2020 Primary Care Physican: [name], MD Primary Cardiologist: Irish Lack Electrophysiologist: Lovena Le 12/16/2020 Weight: 203 lbs   Time in AT/AF  <0.1 hr/day (<0.1%)         Spoke with patient and heart failure questions reviewed.  Pt asymptomatic for fluid accumulation and feeling well.  Will have mouth surgery soon.    Optivol thoracic impedance suggesting normal fluid levels.   Prescribed:  Furosemide 40 mg take 1-2 tablets (40-80 mg total) as directed once daily for edema shortness of breath or weight gain Spironolactone 25 mg take 1 tablet by mouth daily   Labs: 10/29/2020 Creatinine 0.88, BUN 12, Potassium 3.7, Sodium 139, GFR >60 05/30/2020 Creatinine 0.97, BUN 22, Potassium 4.0, Sodium 136, GFR 61 Care Everywhere A complete set of results can be found in Results Review.   Recommendations:  No changes and encouraged to call if experiencing any fluid symptoms.   Follow-up plan: ICM clinic phone appointment on 01/18/2021.   91 day device clinic remote transmission 01/05/2021.     EP/Cardiology Office Visits:  Recall 03/18/2021 with Dr Lovena Le.  Recall 04/11/2021 with Dr Irish Lack.     Copy of ICM check sent to Dr. Lovena Le.    3 month ICM trend: 12/15/2020.    1 Year ICM trend:       [name], RN 12/16/2020 12:31 PM

## 2021-01-05 ENCOUNTER — Ambulatory Visit (INDEPENDENT_AMBULATORY_CARE_PROVIDER_SITE_OTHER): Payer: Medicare HMO

## 2021-01-05 DIAGNOSIS — I428 Other cardiomyopathies: Secondary | ICD-10-CM | POA: Diagnosis not present

## 2021-01-05 LAB — CUP PACEART REMOTE DEVICE CHECK
Battery Remaining Longevity: 15 mo
Battery Voltage: 2.89 V
Brady Statistic AP VP Percent: 0.01 %
Brady Statistic AP VS Percent: 4.71 %
Brady Statistic AS VP Percent: 0.03 %
Brady Statistic AS VS Percent: 95.25 %
Brady Statistic RA Percent Paced: 4.7 %
Brady Statistic RV Percent Paced: 0.04 %
Date Time Interrogation Session: 20220927001607
HighPow Impedance: 75 Ohm
Implantable Lead Implant Date: 20131212
Implantable Lead Implant Date: 20131212
Implantable Lead Location: 753859
Implantable Lead Location: 753860
Implantable Lead Model: 5076
Implantable Lead Model: 6935
Implantable Pulse Generator Implant Date: 20131212
Lead Channel Impedance Value: 456 Ohm
Lead Channel Impedance Value: 608 Ohm
Lead Channel Impedance Value: 760 Ohm
Lead Channel Pacing Threshold Amplitude: 0.375 V
Lead Channel Pacing Threshold Amplitude: 0.5 V
Lead Channel Pacing Threshold Pulse Width: 0.4 ms
Lead Channel Pacing Threshold Pulse Width: 0.4 ms
Lead Channel Sensing Intrinsic Amplitude: 3.375 mV
Lead Channel Sensing Intrinsic Amplitude: 3.375 mV
Lead Channel Sensing Intrinsic Amplitude: 6.875 mV
Lead Channel Sensing Intrinsic Amplitude: 6.875 mV
Lead Channel Setting Pacing Amplitude: 2 V
Lead Channel Setting Pacing Amplitude: 2.5 V
Lead Channel Setting Pacing Pulse Width: 0.4 ms
Lead Channel Setting Sensing Sensitivity: 0.3 mV

## 2021-01-12 NOTE — Progress Notes (Signed)
Remote ICD transmission.   

## 2021-01-18 ENCOUNTER — Ambulatory Visit (INDEPENDENT_AMBULATORY_CARE_PROVIDER_SITE_OTHER): Payer: Medicare HMO

## 2021-01-18 DIAGNOSIS — Z95810 Presence of automatic (implantable) cardiac defibrillator: Secondary | ICD-10-CM | POA: Diagnosis not present

## 2021-01-18 DIAGNOSIS — I5022 Chronic systolic (congestive) heart failure: Secondary | ICD-10-CM | POA: Diagnosis not present

## 2021-01-19 NOTE — Progress Notes (Signed)
EPIC Encounter for ICM Monitoring  Patient Name: Paula Booker is a 75 y.o. female Date: 01/19/2021 Primary Care Physican: [name], MD Primary Cardiologist: Irish Lack Electrophysiologist: Lovena Le 01/18/2021 Weight: 203 lbs   Time in AT/AF  0.0 hr/day (0.0%)         Spoke with patient and heart failure questions reviewed.  Pt asymptomatic for fluid accumulation and feeling well.   Her sister died recently during the time of decreased impedance and was difficult to stay on low salt diet.    Optivol thoracic impedance suggesting possible fluid accumulation starting 9/22 and returned close to baseline on 10/10.   Prescribed:  Furosemide 40 mg take 1-2 tablets (40-80 mg total) as directed once daily for edema shortness of breath or weight gain Spironolactone 25 mg take 1 tablet by mouth daily   Labs: 10/29/2020 Creatinine 0.88, BUN 12, Potassium 3.7, Sodium 139, GFR >60 05/30/2020 Creatinine 0.97, BUN 22, Potassium 4.0, Sodium 136, GFR 61 Care Everywhere A complete set of results can be found in Results Review.   Recommendations:  Recommendation to limit salt intake.  Encouraged to call if experiencing any fluid symptoms.    Follow-up plan: ICM clinic phone appointment on 02/22/2021.   91 day device clinic remote transmission 04/06/2021.     EP/Cardiology Office Visits:  03/18/2021 with Dr Lovena Le.  Recall 04/11/2021 with Dr Irish Lack.     Copy of ICM check sent to Dr. Lovena Le.   3 month ICM trend: 01/18/2021.    1 Year ICM trend:       [name], RN 01/19/2021 8:33 AM

## 2021-02-17 ENCOUNTER — Encounter: Payer: Medicare HMO | Admitting: Internal Medicine

## 2021-02-22 ENCOUNTER — Ambulatory Visit (INDEPENDENT_AMBULATORY_CARE_PROVIDER_SITE_OTHER): Payer: Medicare HMO

## 2021-02-22 DIAGNOSIS — I5022 Chronic systolic (congestive) heart failure: Secondary | ICD-10-CM | POA: Diagnosis not present

## 2021-02-22 DIAGNOSIS — Z95810 Presence of automatic (implantable) cardiac defibrillator: Secondary | ICD-10-CM | POA: Diagnosis not present

## 2021-02-24 NOTE — Progress Notes (Signed)
EPIC Encounter for ICM Monitoring  Patient Name: Paula Booker is a 75 y.o. female Date: 02/24/2021 Primary Care Physican: [name], MD Primary Cardiologist: Irish Lack Electrophysiologist: Lovena Le 02/24/2021 Weight: 197 lbs   Time in AT/AF  0.0 hr/day (0.0%)         Spoke with patient and heart failure questions reviewed.  Pt had surgery in October and trying to recover.    Optivol thoracic impedance suggesting normal fluid levels.   Prescribed:  Furosemide 40 mg take 1-2 tablets (40-80 mg total) as directed once daily for edema shortness of breath or weight gain Spironolactone 25 mg take 1 tablet by mouth daily   Labs: 10/29/2020 Creatinine 0.88, BUN 12, Potassium 3.7, Sodium 139, GFR >60 05/30/2020 Creatinine 0.97, BUN 22, Potassium 4.0, Sodium 136, GFR 61 Care Everywhere A complete set of results can be found in Results Review.   Recommendations:  Recommendation to limit salt intake.  Encouraged to call if experiencing any fluid symptoms.    Follow-up plan: ICM clinic phone appointment on 03/29/2021.   91 day device clinic remote transmission 04/06/2021.     EP/Cardiology Office Visits:  03/18/2021 with Dr Lovena Le.  Recall 04/11/2021 with Dr Irish Lack.     Copy of ICM check sent to Dr. Lovena Le.    3 month ICM trend: 02/23/2021.    12-14 Month ICM trend:       [name], RN 02/24/2021 2:32 PM

## 2021-02-25 ENCOUNTER — Telehealth: Payer: Self-pay | Admitting: *Deleted

## 2021-02-25 DIAGNOSIS — H25811 Combined forms of age-related cataract, right eye: Secondary | ICD-10-CM | POA: Diagnosis not present

## 2021-02-25 DIAGNOSIS — H25812 Combined forms of age-related cataract, left eye: Secondary | ICD-10-CM | POA: Diagnosis not present

## 2021-02-25 DIAGNOSIS — Z01818 Encounter for other preprocedural examination: Secondary | ICD-10-CM | POA: Diagnosis not present

## 2021-02-25 NOTE — Telephone Encounter (Signed)
   Pre-operative Risk Assessment    Patient Name: Paula Booker  DOB: 23-Jul-1945 MRN: [id number]      Request for Surgical Clearance   Procedure:   LEFT  THEN RIGHT EYE CATARACT EXTRACTION BY PE, IOL   Date of Surgery: Clearance 03/15/21                                 Surgeon:  DR. Tama High Surgeon's Group or Practice Name:  [facility] Phone number:  [phone] Fax number:  [phone]   Type of Clearance Requested: - Medical PER CLEARANCE REQUEST NO MEDICATIONS NEED TO BE HELD INCLUDING ANY BLOOD THINNERS   Type of Anesthesia:   IV SEDATION   Additional requests/questions:   Jiles Prows   02/25/2021, 5:42 PM

## 2021-02-26 NOTE — Telephone Encounter (Signed)
   Patient Name: Paula Booker  DOB: 1946-01-09 MRN: [id number]  Primary Cardiologist: Larae Grooms, MD  Chart reviewed as part of pre-operative protocol coverage. Cataract extractions are recognized in guidelines as low risk surgeries that do not typically require specific preoperative testing or holding of blood thinner therapy. Therefore, given past medical history and time since last visit, based on ACC/AHA guidelines, Paula Booker would be at acceptable risk for the planned procedure without further cardiovascular testing.   I will route this recommendation to the requesting party via Epic fax function and remove from pre-op pool.  Please call with questions.  Deberah Pelton, NP 02/26/2021, 7:02 AM

## 2021-03-15 DIAGNOSIS — H2512 Age-related nuclear cataract, left eye: Secondary | ICD-10-CM | POA: Diagnosis not present

## 2021-03-15 DIAGNOSIS — H25812 Combined forms of age-related cataract, left eye: Secondary | ICD-10-CM | POA: Diagnosis not present

## 2021-03-18 ENCOUNTER — Encounter: Payer: Self-pay | Admitting: Internal Medicine

## 2021-03-18 ENCOUNTER — Ambulatory Visit (INDEPENDENT_AMBULATORY_CARE_PROVIDER_SITE_OTHER): Payer: Medicare HMO | Admitting: Internal Medicine

## 2021-03-18 ENCOUNTER — Other Ambulatory Visit: Payer: Self-pay

## 2021-03-18 VITALS — BP 118/64 | HR 74 | Ht 64.0 in | Wt 204.0 lb

## 2021-03-18 DIAGNOSIS — I480 Paroxysmal atrial fibrillation: Secondary | ICD-10-CM

## 2021-03-18 DIAGNOSIS — I428 Other cardiomyopathies: Secondary | ICD-10-CM | POA: Diagnosis not present

## 2021-03-18 DIAGNOSIS — Z95810 Presence of automatic (implantable) cardiac defibrillator: Secondary | ICD-10-CM | POA: Diagnosis not present

## 2021-03-18 MED ORDER — CARVEDILOL 6.25 MG PO TABS: 6.2500 mg | ORAL_TABLET | Freq: Two times a day (BID) | ORAL | 3 refills | Status: DC

## 2021-03-18 MED ORDER — ENTRESTO 24-26 MG PO TABS: 1.0000 | ORAL_TABLET | Freq: Two times a day (BID) | ORAL | 11 refills | Status: DC

## 2021-03-18 NOTE — Progress Notes (Signed)
HPI Mrs. Masoner returns today for ongoing evaluation. She has a h/o ICM, chronic systolic heart failure, syncope, and a WPW pattern on her ECG. She underwent EP study which demonstrated no inducible arrhythmias. She underwent ICD insertion. She has a h/o generalized weakness.  She has had trouble with insomnia. Her energy level has worsened.  She admits to dietary indiscretion.  Allergies  Allergen Reactions   Ivp Dye [Iodinated Diagnostic Agents] Hives and Other (See Comments)    "Airway swelling" per pt    Penicillins Anaphylaxis    Has patient had a PCN reaction causing immediate rash, facial/tongue/throat swelling, SOB or lightheadedness with hypotension: Yes Has patient had a PCN reaction causing severe rash involving mucus membranes or skin necrosis: No Has patient had a PCN reaction that required hospitalization No Has patient had a PCN reaction occurring within the last 10 years: No If all of the above answers are "NO", then may proceed with Cephalosporin use.   Sulfonamide Derivatives Other (See Comments)    Migraines   Tessalon [Benzonatate] Hives and Rash    Severe rash   Sulfa Antibiotics Other (See Comments)    Migraines   Rosuvastatin Calcium Other (See Comments)    Reaction not recalled by the patient   Onion Other (See Comments)    Raw onions cause migraines     Current Outpatient Medications  Medication Sig Dispense Refill   acetaminophen (TYLENOL) 500 MG tablet Take 500 mg by mouth in the morning and at bedtime.     albuterol (VENTOLIN HFA) 108 (90 Base) MCG/ACT inhaler Inhale 2 puffs into the lungs every 6 (six) hours as needed for wheezing. For wheezing. Use 2 puffs 3 times daily x5 days and then every 6 hours as needed. 18 g 5   alendronate (FOSAMAX) 70 MG tablet Take 70 mg by mouth every Sunday.      Calcium Carbonate (CALCIUM 500 PO) Take by mouth. One tablet daily.     carvedilol (COREG) 12.5 MG tablet TAKE 1 TABLET BY MOUTH TWICE DAILY WITH A MEAL  180 tablet 1   cetirizine (ZYRTEC) 10 MG tablet Take 10 mg by mouth daily.     Cholecalciferol (VITAMIN D3) 5000 units CAPS Take 5,000 Units by mouth daily.     Cyanocobalamin (VITAMIN B-12) 5000 MCG LOZG Take 5,000 mcg by mouth daily.     ELIQUIS 5 MG TABS tablet Take 1 tablet by mouth twice daily 180 tablet 1   erythromycin ophthalmic ointment Place a 1/2 inch ribbon of ointment into the lower eyelid. 3.5 g 0   escitalopram (LEXAPRO) 20 MG tablet Take 20 mg by mouth daily.     Fluorouracil (TOLAK) 4 % CREA Apply to nost qhs Monday - Sunday x 2 weeks- avoid sunlight 40 g 1   fluticasone (FLONASE) 50 MCG/ACT nasal spray Place 2 sprays into both nostrils daily.     fluticasone furoate-vilanterol (BREO ELLIPTA) 200-25 MCG/INH AEPB Inhale 1 puff by mouth once daily 60 each 5   furosemide (LASIX) 40 MG tablet TAKE 1 TO 2 TABLETS BY MOUTH AS DIRECTED ONCE DAILY FOR EDEMA, SHORTNESS OF BREATH OR WEIGHT GAIN 180 tablet 3   gabapentin (NEURONTIN) 100 MG capsule Take 1 capsule (100 mg total) by mouth 3 (three) times daily as needed. 90 capsule 1   hydroxypropyl methylcellulose / hypromellose (ISOPTO TEARS / GONIOVISC) 2.5 % ophthalmic solution Place 1 drop into the right eye 4 (four) times daily as needed for dry eyes. 15  mL 0   ipratropium-albuterol (DUONEB) 0.5-2.5 (3) MG/3ML SOLN Take 3 mLs by nebulization every 4 (four) hours as needed. 360 mL 1   losartan (COZAAR) 25 MG tablet Take 1 tablet by mouth twice daily 180 tablet 3   Multiple Vitamins-Minerals (CENTRUM SILVER 50+WOMEN) TABS Take 1 tablet by mouth daily with breakfast.      pantoprazole (PROTONIX) 40 MG tablet Take 40 mg by mouth daily.  0   predniSONE (DELTASONE) 50 MG tablet Take 1 pill 13 hours, 7 hours  and 1 hour before CT contrast for a total of 3 doses of prednisone. Take 50mg  of Benadryl with the prednisone 1 hour before CT for a total of one dose of benadryl. Do not drive. 3 tablet 0   spironolactone (ALDACTONE) 25 MG tablet Take 1  tablet by mouth once daily 90 tablet 3   No current facility-administered medications for this visit.     Past Medical History:  Diagnosis Date   AICD (automatic cardioverter/defibrillator) present    Anemia    as a child   Anxiety    Arthritis    "hands and knees" (03/22/2012)   Asthma    Basal cell carcinoma 04/27/2011   mid forehead(MOHS)   Bursitis    CHF (congestive heart failure) ([place]) 09/2011   pt. denies   Depression    Dysrhythmia    WPW   GERD (gastroesophageal reflux disease)    Head injury, acute, with loss of consciousness ([place])    History of hiatal hernia    Hypertension    ICD (implantable cardiac defibrillator) in place 03/2012   Migraines    migraines    NICM (nonischemic cardiomyopathy) ([place])    Obesity (BMI 30-39.9)    negative sleep study 2014   OSA (obstructive sleep apnea) 05/27/2018   Mild with AHI 6/hr    no cpap   Pneumonia    hx   Shortness of breath    "@ any time I can get SOB" (03/22/2012)   Skin cancer 1999   Squamous cell carcinoma of skin 07/08/2020   in situ right malar cheek-[id number]   Sudden arrhythmia death syndrome    WPW (Wolff-Parkinson-White syndrome)     ROS:   All systems reviewed and negative except as noted in the HPI.   Past Surgical History:  Procedure Laterality Date   CARDIAC CATHETERIZATION  09/18/11   no significant CAD   CARDIAC DEFIBRILLATOR PLACEMENT  03/22/2012   DDD   CHOLECYSTECTOMY  ~ 2003   IMPLANTABLE CARDIOVERTER DEFIBRILLATOR IMPLANT N/A 03/22/2012   Procedure: IMPLANTABLE CARDIOVERTER DEFIBRILLATOR IMPLANT;  Surgeon: Evans Lance, MD;  Location: [facility] CATH LAB;  Service: Cardiovascular;  Laterality: N/A;   KNEE ARTHROSCOPY Left 11/29/2017   Procedure: ARTHROSCOPY LEFT KNEE;  Surgeon: Dorna Leitz, MD;  Location: [facility] ORS;  Service: Orthopedics;  Laterality: Left;   KNEE ARTHROSCOPY Right 08/09/2019   Procedure: ARTHROSCOPY RIGHT KNEE, CHONDROPLASTY,  PARTIAL MEDIAL  MENSICECTOMY;  Surgeon: Dorna Leitz, MD;  Location: [facility] ORS;  Service: Orthopedics;  Laterality: Right;   KYPHOPLASTY N/A 11/27/2013   Procedure: KYPHOPLASTY;  Surgeon: Sinclair Ship, MD;  Location: [facility];  Service: Orthopedics;  Laterality: N/A;  T9, T11 kyphoplasty   KYPHOPLASTY N/A 06/05/2014   Procedure: KYPHOPLASTY;  Surgeon: Sinclair Ship, MD;  Location: [facility];  Service: Orthopedics;  Laterality: N/A;  T7 kyphoplasty   MOHS SURGERY  06/2011   "forehead" (03/22/2012)   SKIN CANCER EXCISION  1999   "top of  my head and my right back shoulder"   SKIN CANCER EXCISION     back   SKIN CANCER EXCISION     face   SUPRAVENTRICULAR TACHYCARDIA ABLATION  03/22/2012   unable to induce VT/RN report 03/22/2012   SUPRAVENTRICULAR TACHYCARDIA ABLATION N/A 03/22/2012   Procedure: SUPRAVENTRICULAR TACHYCARDIA ABLATION;  Surgeon: Evans Lance, MD;  Location[facility] C[facility] LAB;  Service: Cardiovascular;  Laterality: N/A;   TONSILLECTOMY AND ADENOIDECTOMY  1950   TUBAL LIGATION  1978     Family History  Problem Relation Age of Onset   Congestive Heart Failure Mother        died 30   Kidney Stones Mother    Deafness Mother    Atrial fibrillation Mother    Congestive Heart Failure Father    Deafness Father    Healthy Sister    Healthy Sister    Healthy Sister    Congestive Heart Failure Brother    Atrial fibrillation Brother    Tuberculosis Paternal Grandfather    Migraines Neg Hx    Neural tube defect Neg Hx      Social History   Socioeconomic History   Marital status: Divorced    Spouse name: Not on file   Number of children: 3   Years of education: Not on file   Highest education level: Not on file  Occupational History   Occupation: retired    Comment: Optometrist  Tobacco Use   Smoking status: Never   Smokeless tobacco: Never  Vaping Use   Vaping Use: Never used  Substance and Sexual Activity   Alcohol use: Not Currently    Comment: rare   Drug use: No   Sexual activity: Not Currently   Other Topics Concern   Not on file  Social History Narrative   Divorced, 3 children, 9 grandchildren. Did work for [employer]. Retired at 75 y/o .   Social Determinants of Health   Financial Resource Strain: Not on file  Food Insecurity: Not on file  Transportation Needs: Not on file  Physical Activity: Not on file  Stress: Not on file  Social Connections: Not on file  Intimate Partner Violence: Not on file     Ht 5\' 4"  (1.626 m)   Wt 204 lb (92.5 kg)   BMI 35.02 kg/m   Physical Exam:  Well appearing NAD HEENT: Unremarkable Neck:  No JVD, no thyromegally Lymphatics:  No adenopathy Back:  No CVA tenderness Lungs:  Clear with no wheezes HEART:  Regular rate rhythm, no murmurs, no rubs, no clicks Abd:  soft, positive bowel sounds, no organomegally, no rebound, no guarding Ext:  2 plus pulses, no edema, no cyanosis, no clubbing Skin:  No rashes no nodules Neuro:  CN II through XII intact, motor grossly intact  EKG - nsr with PVC's  DEVICE  Normal device function.  See PaceArt for details.   Assess/Plan:  1. Chronic systolic heart failure -her symptoms are class 2A. She has felt poorly. She will stop losartan and start Entresto and reduce the dose of her coreg to 6.25 mg bid. 2. ICD - her DDD ICD is working normally and she has just over 2 years of battery longevity. 3. Obesity - I encouraged her to lose weight.  She is down about 5 lbs. 4. HTN - her bp is well controlled. She will maintain a low sodium diet. 5. PAF - she is maintaining NSR. She will continue her systemic anti-coag.   Carleene Overlie Shivank Pinedo,MD

## 2021-03-18 NOTE — Patient Instructions (Addendum)
Medication Instructions:  Your physician has recommended you make the following change in your medication:    STOP losartan  2.   REDUCE your carvedilol to 6.25 mg-  Take one tablet by mouth twice a day  3.    START taking Entresto 24-26 mg-  Take one tablet by mouth twice a day   Labwork: None ordered.  Testing/Procedures: None ordered.  Follow-Up:  2 weeks with pharmacy for [facility] initiation/titration.  Your physician wants you to follow-up in: one year with Paula Peru, MD or one of the following Advanced Practice Providers on your designated Care Team:   Tommye Standard, [place] Legrand Como "Jonni Sanger" Chalmers Cater, [place]  Remote monitoring is used to monitor your ICD from home. This monitoring reduces the number of office visits required to check your device to one time per year. It allows [place] to keep an eye on the functioning of your device to ensure it is working properly. You are scheduled for a device check from home on 03/29/2021. You may send your transmission at any time that day. If you have a wireless device, the transmission will be sent automatically. After your physician reviews your transmission, you will receive a postcard with your next transmission date.  Any Other Special Instructions Will Be Listed Below (If Applicable).  If you need a refill on your cardiac medications before your next appointment, please call your pharmacy.   Sacubitril; Valsartan Oral Tablets What is this medication? SACUBITRIL; VALSARTAN (sak UE bi tril; val SAR tan) is a combination of a neprilysin inhibitor and a an angiotensin II receptor blocker. It treats heart failure. This medicine may be used for other purposes; ask your health care provider or pharmacist if you have questions. COMMON BRAND NAME(S): Entresto What should I tell my care team before I take this medication? They need to know if you have any of these conditions: diabetes and take a medicine that contains aliskiren high levels of  potassium in the blood kidney disease liver disease low blood pressure an unusual or allergic reaction to sacubitril; valsartan, drugs called angiotensin converting enzyme (ACE) inhibitors, angiotensin II receptor blockers (ARBs), other medicines, foods, dyes, or preservatives pregnant or trying to get pregnant breast-feeding How should I use this medication? Take this medicine by mouth. Take it as directed on the prescription label at the same time every day. You can take it with or without food. If it upsets your stomach, take it with food. Keep taking it unless your health care provider tells you to stop. Talk to your health care provider about the use of this drug in children. While it may be prescribed for children as young as 1 for selected conditions, precautions do apply. Overdosage: If you think you have taken too much of this medicine contact a poison control center or emergency room at once. NOTE: This medicine is only for you. Do not share this medicine with others. What if I miss a dose? If you miss a dose, take it as soon as you can. If it is almost time for your next dose, take only that dose. Do not take double or extra doses. What may interact with this medication? Do not take this medicine with any of the following medicines: aliskiren if you have diabetes angiotensin-converting enzyme (ACE) inhibitors, like benazepril, captopril, enalapril, fosinopril, lisinopril, or ramipril tranylcypromine This medicine may also interact with the following medicines: angiotensin II receptor blockers (ARBs) like azilsartan, candesartan, eprosartan, irbesartan, losartan, olmesartan, telmisartan, or valsartan celecoxib lithium NSAIDS, medicines  for pain and inflammation, like ibuprofen or naproxen potassium-sparing diuretics like amiloride, spironolactone, and triamterene potassium supplements This list may not describe all possible interactions. Give your health care provider a list of  all the medicines, herbs, non-prescription drugs, or dietary supplements you use. Also tell them if you smoke, drink alcohol, or use illegal drugs. Some items may interact with your medicine. What should I watch for while using this medication? Tell your doctor or health care provider if your symptoms do not start to get better or if they get worse. Do not become pregnant while taking this medicine. Women should inform their health care provider if they wish to become pregnant or think they might be pregnant. There is a potential for serious harm to an unborn child. Talk to your health care provider for more information. You may get drowsy or dizzy. Do not drive, use machinery, or do anything that needs mental alertness until you know how this medicine affects you. Do not stand or sit up quickly, especially if you are an older patient. This reduces the risk of dizzy or fainting spells. Alcohol may interfere with the effects of this medicine. Avoid alcoholic drinks. Avoid salt substitutes unless you are told otherwise by your health care provider. What side effects may I notice from receiving this medication? Side effects that you should report to your doctor or health care provider as soon as possible: allergic reactions (skin rash, itching or hives; swelling of the face, lips, or tongue) high potassium levels (chest pain; fast, irregular heartbeat; muscle weakness) kidney injury (trouble passing urine or change in the amount of urine) low blood pressure (dizziness; feeling faint or lightheaded, falls; unusually weak or tired) Side effects that usually do not require medical attention (report to your doctor or health care provider if they continue or are bothersome): cough This list may not describe all possible side effects. Call your doctor for medical advice about side effects. You may report side effects to FDA at 1-800-FDA-1088. Where should I keep my medication? Keep out of the reach of  children and pets. Store at room temperature between 20 and 25 degrees C (68 and 77 degrees F). Protect from moisture. Keep the container tightly closed. Get rid of any unused medicine after the expiration date. To get rid of medicines that are no longer needed or have expired: Take the medicine to a take-back program. Check with your pharmacy or law enforcement to find a location. If you cannot return the medicine, check the label or package insert to see if the medicine should be thrown out in the garbage or flushed down the toilet. If you are not sure, ask your health care provider. If it is safe to put it in the trash, empty the medicine out of the container. Mix the medicine with cat litter, dirt, coffee grounds, or other unwanted substance. Seal the mixture in a bag or container. Put it in the trash. NOTE: This sheet is a summary. It may not cover all possible information. If you have questions about this medicine, talk to your doctor, pharmacist, or health care provider.  2022 Elsevier/[employer] Standard (2020-12-15 00:00:00)

## 2021-03-25 ENCOUNTER — Other Ambulatory Visit: Payer: Self-pay | Admitting: Internal Medicine

## 2021-03-25 DIAGNOSIS — I480 Paroxysmal atrial fibrillation: Secondary | ICD-10-CM

## 2021-03-25 NOTE — Telephone Encounter (Signed)
Eliquis 5mg  refill request received. Patient is 75 years old, weight-92.5kg, Crea-0.88 on 10/29/2020, Diagnosis-Afib, and last seen by Dr. Lovena Le on 03/18/2021. Dose is appropriate based on dosing criteria. Will send in refill to requested pharmacy.

## 2021-03-29 ENCOUNTER — Ambulatory Visit (INDEPENDENT_AMBULATORY_CARE_PROVIDER_SITE_OTHER): Payer: Medicare HMO

## 2021-03-29 DIAGNOSIS — H25811 Combined forms of age-related cataract, right eye: Secondary | ICD-10-CM | POA: Diagnosis not present

## 2021-03-29 DIAGNOSIS — H2511 Age-related nuclear cataract, right eye: Secondary | ICD-10-CM | POA: Diagnosis not present

## 2021-03-29 DIAGNOSIS — I5022 Chronic systolic (congestive) heart failure: Secondary | ICD-10-CM

## 2021-03-29 DIAGNOSIS — Z95810 Presence of automatic (implantable) cardiac defibrillator: Secondary | ICD-10-CM | POA: Diagnosis not present

## 2021-03-30 NOTE — Progress Notes (Signed)
EPIC Encounter for ICM Monitoring  Patient Name: Paula Booker is a 75 y.o. female Date: 03/30/2021 Primary Care Physican: [name], MD Primary Cardiologist: Irish Lack Electrophysiologist: Lovena Le 02/24/2021 Weight: 197 lbs   Time in AT/AF  0.0 hr/day (0.0%)         Spoke with patient and heart failure questions reviewed.  Pt had cataract surgery yesterday.   She is feeling fine.   Optivol thoracic impedance suggesting normal fluid levels.   Prescribed:  Furosemide 40 mg take 1-2 tablets (40-80 mg total) as directed once daily for edema shortness of breath or weight gain Spironolactone 25 mg take 1 tablet by mouth daily   Labs: 10/29/2020 Creatinine 0.88, BUN 12, Potassium 3.7, Sodium 139, GFR >60 05/30/2020 Creatinine 0.97, BUN 22, Potassium 4.0, Sodium 136, GFR 61 Care Everywhere A complete set of results can be found in Results Review.   Recommendations:  Encouraged to call if experiencing any fluid symptoms.    Follow-up plan: ICM clinic phone appointment on 05/03/2021.   91 day device clinic remote transmission 04/06/2021.     EP/Cardiology Office Visits:    Recall 04/11/2021 with Dr Irish Lack.     Copy of ICM check sent to Dr. Lovena Le.    3 month ICM trend: 03/29/2021.    12-14 Month ICM trend:       [name], RN 03/30/2021 1:12 PM

## 2021-04-06 ENCOUNTER — Ambulatory Visit (INDEPENDENT_AMBULATORY_CARE_PROVIDER_SITE_OTHER): Payer: Medicare HMO

## 2021-04-06 DIAGNOSIS — I428 Other cardiomyopathies: Secondary | ICD-10-CM

## 2021-04-07 LAB — CUP PACEART REMOTE DEVICE CHECK
Battery Remaining Longevity: 13 mo
Battery Voltage: 2.87 V
Brady Statistic AP VP Percent: 0 %
Brady Statistic AP VS Percent: 0.77 %
Brady Statistic AS VP Percent: 0.03 %
Brady Statistic AS VS Percent: 99.2 %
Brady Statistic RA Percent Paced: 0.77 %
Brady Statistic RV Percent Paced: 0.03 %
Date Time Interrogation Session: 20221228132117
HighPow Impedance: 87 Ohm
Implantable Lead Implant Date: 20131212
Implantable Lead Implant Date: 20131212
Implantable Lead Location: 753859
Implantable Lead Location: 753860
Implantable Lead Model: 5076
Implantable Lead Model: 6935
Implantable Pulse Generator Implant Date: 20131212
Lead Channel Impedance Value: 475 Ohm
Lead Channel Impedance Value: 551 Ohm
Lead Channel Impedance Value: 665 Ohm
Lead Channel Pacing Threshold Amplitude: 0.375 V
Lead Channel Pacing Threshold Amplitude: 0.625 V
Lead Channel Pacing Threshold Pulse Width: 0.4 ms
Lead Channel Pacing Threshold Pulse Width: 0.4 ms
Lead Channel Sensing Intrinsic Amplitude: 3.5 mV
Lead Channel Sensing Intrinsic Amplitude: 3.5 mV
Lead Channel Sensing Intrinsic Amplitude: 8 mV
Lead Channel Sensing Intrinsic Amplitude: 8 mV
Lead Channel Setting Pacing Amplitude: 2 V
Lead Channel Setting Pacing Amplitude: 2.5 V
Lead Channel Setting Pacing Pulse Width: 0.4 ms
Lead Channel Setting Sensing Sensitivity: 0.3 mV

## 2021-04-09 ENCOUNTER — Other Ambulatory Visit: Payer: Self-pay

## 2021-04-09 ENCOUNTER — Ambulatory Visit (INDEPENDENT_AMBULATORY_CARE_PROVIDER_SITE_OTHER): Payer: Medicare HMO | Admitting: Pharmacist

## 2021-04-09 VITALS — BP 118/68 | HR 74

## 2021-04-09 DIAGNOSIS — I5022 Chronic systolic (congestive) heart failure: Secondary | ICD-10-CM

## 2021-04-09 DIAGNOSIS — I5023 Acute on chronic systolic (congestive) heart failure: Secondary | ICD-10-CM

## 2021-04-09 LAB — BASIC METABOLIC PANEL
BUN/Creatinine Ratio: 13 (ref 12–28)
BUN: 12 mg/dL (ref 8–27)
CO2: 25 mmol/L (ref 20–29)
Calcium: 10 mg/dL (ref 8.7–10.3)
Chloride: 100 mmol/L (ref 96–106)
Creatinine, Ser: 0.96 mg/dL (ref 0.57–1.00)
Glucose: 138 mg/dL — ABNORMAL HIGH (ref 70–99)
Potassium: 4.4 mmol/L (ref 3.5–5.2)
Sodium: 138 mmol/L (ref 134–144)
eGFR: 62 mL/min/{1.73_m2} (ref >59)

## 2021-04-09 MED ORDER — ENTRESTO 24-26 MG PO TABS: 1.0000 | ORAL_TABLET | Freq: Two times a day (BID) | ORAL | 3 refills | Status: DC

## 2021-04-09 NOTE — Progress Notes (Signed)
Patient ID: Paula Booker                 DOB: [birth year]                      MRN: [id number]     HPI: Paula Booker is a 75 y.o. female referred by Dr. Lovena Le to pharmacy clinic for HF medication management. PMH is significant for h/o ICM, chronic systolic heart failure, syncope, and a WPW pattern on her ECG. Most recent LVEF 25-30% on 07/01/19.  Today she presents to pharmacy clinic for medication titration. At last visit with MD she complained of feeling poorly, class 2A symptoms. Her losartan was stopped and changed to [facility]. Carvedilol was decreased to 6.25mg  BID. Today, symptomatically, she is feeling great, denies dizziness, lightheadedness, and fatigue. Feels like she has the energy she needs to do everything she needs to do. Able to complete all ADLs. Activity level high.  No LEE, PND, or orthopnea. Takes furosemide 80mg  daily. Appetite has been good. She adheres to a low-salt diet.  Cost of Delene Loll is fine. Patient has both medicare and medicaid and pays $4. Requesting a 90 day supply. She requested a 90 day supply on Entresto be sent.  Current CHF meds: Entresto 24/26mg  twice a day, carvedilol 6.25mg  twice a day, spironolactone 25mg  daily, furosemide 80mg  daily Previously tried: losartan (changed to Entresto) BP goal: <130/80  Family History:  Family History  Problem Relation Age of Onset   Congestive Heart Failure Mother        died 75   Kidney Stones Mother    Deafness Mother    Atrial fibrillation Mother    Congestive Heart Failure Father    Deafness Father    Healthy Sister    Healthy Sister    Healthy Sister    Congestive Heart Failure Brother    Atrial fibrillation Brother    Tuberculosis Paternal Grandfather    Migraines Neg Hx    Neural tube defect Neg Hx      Social History:   Diet: gingerale  Exercise:   Home BP readings:   Wt Readings from Last 3 Encounters:  03/18/21 204 lb (92.5 kg)  11/05/20 206 lb (93.4 kg)  04/16/20 208 lb 12.8 oz  (94.7 kg)   BP Readings from Last 3 Encounters:  03/18/21 118/64  11/05/20 122/68  10/29/20 130/65   Pulse Readings from Last 3 Encounters:  03/18/21 74  11/05/20 79  10/29/20 78    Renal function: CrCl cannot be calculated (Patient's most recent lab result is older than the maximum 21 days allowed.).  Past Medical History:  Diagnosis Date   AICD (automatic cardioverter/defibrillator) present    Anemia    as a child   Anxiety    Arthritis    "hands and knees" (03/22/2012)   Asthma    Basal cell carcinoma 04/27/2011   mid forehead([facility])   Bursitis    CHF (congestive heart failure) ([place]) 09/2011   pt. denies   Depression    Dysrhythmia    WPW   GERD (gastroesophageal reflux disease)    Head injury, acute, with loss of consciousness ([place])    History of hiatal hernia    Hypertension    ICD (implantable cardiac defibrillator) in place 03/2012   Migraines    migraines    NICM (nonischemic cardiomyopathy) ([place])    Obesity (BMI 30-39.9)    negative sleep study 2014   OSA (obstructive sleep apnea) 05/27/2018  Mild with AHI 6/hr    no cpap   Pneumonia    hx   Shortness of breath    "@ any time I can get SOB" (03/22/2012)   Skin cancer 1999   Squamous cell carcinoma of skin 07/08/2020   in situ right malar cheek-[id number]   Sudden arrhythmia death syndrome    WPW (Wolff-Parkinson-White syndrome)     Current Outpatient Medications on File Prior to Visit  Medication Sig Dispense Refill   apixaban (ELIQUIS) 5 MG TABS tablet Take 1 tablet by mouth twice daily 180 tablet 1   acetaminophen (TYLENOL) 500 MG tablet Take 500 mg by mouth in the morning and at bedtime.     albuterol (VENTOLIN HFA) 108 (90 Base) MCG/ACT inhaler Inhale 2 puffs into the lungs every 6 (six) hours as needed for wheezing. For wheezing. Use 2 puffs 3 times daily x5 days and then every 6 hours as needed. 18 g 5   alendronate (FOSAMAX) 70 MG tablet Take 70 mg by mouth every Sunday.      Calcium  Carbonate (CALCIUM 500 PO) Take by mouth. One tablet daily.     carvedilol (COREG) 6.25 MG tablet Take 1 tablet (6.25 mg total) by mouth 2 (two) times daily. 180 tablet 3   cetirizine (ZYRTEC) 10 MG tablet Take 10 mg by mouth daily.     Cholecalciferol (VITAMIN D3) 5000 units CAPS Take 5,000 Units by mouth daily.     Cyanocobalamin (VITAMIN B-12) 5000 MCG LOZG Take 5,000 mcg by mouth daily.     erythromycin ophthalmic ointment Place a 1/2 inch ribbon of ointment into the lower eyelid. 3.5 g 0   escitalopram (LEXAPRO) 20 MG tablet Take 20 mg by mouth daily.     Fluorouracil (TOLAK) 4 % CREA Apply to nost qhs Monday - Sunday x 2 weeks- avoid sunlight 40 g 1   fluticasone (FLONASE) 50 MCG/ACT nasal spray Place 2 sprays into both nostrils daily.     fluticasone furoate-vilanterol (BREO ELLIPTA) 200-25 MCG/INH AEPB Inhale 1 puff by mouth once daily 60 each 5   furosemide (LASIX) 40 MG tablet TAKE 1 TO 2 TABLETS BY MOUTH AS DIRECTED ONCE DAILY FOR EDEMA, SHORTNESS OF BREATH OR WEIGHT GAIN 180 tablet 3   gabapentin (NEURONTIN) 100 MG capsule Take 1 capsule (100 mg total) by mouth 3 (three) times daily as needed. 90 capsule 1   hydroxypropyl methylcellulose / hypromellose (ISOPTO TEARS / GONIOVISC) 2.5 % ophthalmic solution Place 1 drop into the right eye 4 (four) times daily as needed for dry eyes. 15 mL 0   ipratropium-albuterol (DUONEB) 0.5-2.5 (3) MG/3ML SOLN Take 3 mLs by nebulization every 4 (four) hours as needed. 360 mL 1   Multiple Vitamins-Minerals (CENTRUM SILVER 50+WOMEN) TABS Take 1 tablet by mouth daily with breakfast.      pantoprazole (PROTONIX) 40 MG tablet Take 40 mg by mouth daily.  0   rosuvastatin (CRESTOR) 5 MG tablet Take 5 mg by mouth daily.     sacubitril-valsartan (ENTRESTO) 24-26 MG Take 1 tablet by mouth 2 (two) times daily. 60 tablet 11   spironolactone (ALDACTONE) 25 MG tablet Take 1 tablet by mouth once daily 90 tablet 3   No current facility-administered medications on  file prior to visit.    Allergies  Allergen Reactions   Ivp Dye [Iodinated Contrast Media] Hives and Other (See Comments)    "Airway swelling" per pt    Penicillins Anaphylaxis    Has patient had a PCN  reaction causing immediate rash, facial/tongue/throat swelling, SOB or lightheadedness with hypotension: Yes Has patient had a PCN reaction causing severe rash involving mucus membranes or skin necrosis: No Has patient had a PCN reaction that required hospitalization No Has patient had a PCN reaction occurring within the last 10 years: No If all of the above answers are "NO", then may proceed with Cephalosporin use.   Sulfonamide Derivatives Other (See Comments)    Migraines   Tessalon [Benzonatate] Hives and Rash    Severe rash   Sulfa Antibiotics Other (See Comments)    Migraines   Rosuvastatin Calcium Other (See Comments)    Reaction not recalled by the patient   Onion Other (See Comments)    Raw onions cause migraines     Assessment/Plan:  1. CHF - Blood pressure is good at 118/68. She only checks at home when she feels off. I have asked her to start checking daily. She feels a lot better after medication changes. We discussed adding a SGLT2 inhibitor. Patient does not have any hx of UTI/genital infections. Will check BMP today since switch to [facility]. If stable, will plan to start Jardiance 10mg  daily and decrease furosemide to 60mg  daily. I will call patient with results either at the end of today or Tuesday depending on when they result. Follow up in office in ~2 weeks. At that time, if BP allows will increase Entresto.   Thank you,  Ramond Dial, Pharm.D, BCPS, CPP [place]  [address] [place], [place] [address]  Phone: [phone]; Fax: [phone]

## 2021-04-09 NOTE — Patient Instructions (Addendum)
Please start checking your blood pressure once a day. Bring your readings and cuff with you to your next appointment. I will call you with your lab results. If all looks good we will plan to start Jardiance 10mg  daily and decrease furosemide. Please call me at [phone] with any questions.

## 2021-04-13 ENCOUNTER — Telehealth: Payer: Self-pay | Admitting: Pharmacist

## 2021-04-13 MED ORDER — EMPAGLIFLOZIN 10 MG PO TABS: 10.0000 mg | ORAL_TABLET | Freq: Every day | ORAL | 3 refills | Status: DC

## 2021-04-13 NOTE — Telephone Encounter (Signed)
Spoke with patient. Labs are stable. Will start Jardiance 10mg  daily. No PA needed per covermymeds. Decrease furosemide to 40mg  daily. Follow up 1/17. Pt bought new BO cuff and will start checking BP. She will bring cuff and readings with her to next appointment.

## 2021-04-14 NOTE — Progress Notes (Signed)
Remote ICD transmission.   

## 2021-04-27 ENCOUNTER — Ambulatory Visit: Payer: Medicare HMO

## 2021-04-27 NOTE — Progress Notes (Deleted)
Patient ID: [name]                 DOB: Aug 06, 1945                      MRN: [id number]     HPI: Paula Booker is a 76 y.o. female referred by Dr. Lovena Booker to pharmacy clinic for HF medication management. PMH is significant for h/o ICM, chronic systolic heart failure, syncope, and a WPW pattern on her ECG. Most recent LVEF 25-30% on 07/01/19.   Today she presents to pharmacy clinic for medication titration. At last visit with PharmD, Paula Booker 10mg  daily was started and furosemide was decreased to 40mg  daily.  She was asked to start checking her blood pressure and weight at home. she complained of feeling poorly, class 2A symptoms. Her losartan was stopped and changed to [name]. Carvedilol was decreased to 6.25mg  BID. Today, symptomatically, she is feeling great, denies dizziness, lightheadedness, and fatigue. Feels like she has the energy she needs to do everything she needs to do. Able to complete all ADLs. Activity level high.  No LEE, PND, or orthopnea. Takes furosemide 80mg  daily. Appetite has been good. She adheres to a low-salt diet.  Cost of Delene Loll is fine. Patient has both medicare and medicaid and pays $4. Requesting a 90 day supply. She requested a 90 day supply on Entresto be sent.  Symptoms? Dizziness, lightheadedness, headache, blurred vision, SOB, swelling Blood pressure Can we increase entresto?  Current CHF meds: Entresto 24/26mg  twice a day, carvedilol 6.25mg  twice a day, spironolactone 25mg  daily, furosemide 40mg  daily, Jardiance 10mg  daily Previously tried: losartan (changed to [name]) BP goal: <130/80  Family History:  Family History  Problem Relation Age of Onset   Congestive Heart Failure Mother        died 34   Kidney Stones Mother    Deafness Mother    Atrial fibrillation Mother    Congestive Heart Failure Father    Deafness Father    Healthy Sister    Healthy Sister    Healthy Sister    Congestive Heart Failure Brother    Atrial fibrillation  Brother    Tuberculosis Paternal Grandfather    Migraines Neg Hx    Neural tube defect Neg Hx      Social History:   Diet: gingerale  Exercise:   Home BP readings:   Wt Readings from Last 3 Encounters:  03/18/21 204 lb (92.5 kg)  11/05/20 206 lb (93.4 kg)  04/16/20 208 lb 12.8 oz (94.7 kg)   BP Readings from Last 3 Encounters:  04/09/21 118/68  03/18/21 118/64  11/05/20 122/68   Pulse Readings from Last 3 Encounters:  04/09/21 74  03/18/21 74  11/05/20 79    Renal function: CrCl cannot be calculated (Unknown ideal weight.).  Past Medical History:  Diagnosis Date   AICD (automatic cardioverter/defibrillator) present    Anemia    as a child   Anxiety    Arthritis    "hands and knees" (03/22/2012)   Asthma    Basal cell carcinoma 04/27/2011   mid forehead(MOHS)   Bursitis    CHF (congestive heart failure) ([place]) 09/2011   pt. denies   Depression    Dysrhythmia    WPW   GERD (gastroesophageal reflux disease)    Head injury, acute, with loss of consciousness ([place])    History of hiatal hernia    Hypertension    ICD (implantable cardiac defibrillator) in place  03/2012   Migraines    migraines    NICM (nonischemic cardiomyopathy) (HCC)    Obesity (BMI 30-39.9)    negative sleep study 2014   OSA (obstructive sleep apnea) 05/27/2018   Mild with AHI 6/hr    no cpap   Pneumonia    hx   Shortness of breath    "@ any time I can get SOB" (03/22/2012)   Skin cancer 1999   Squamous cell carcinoma of skin 07/08/2020   in situ right malar cheek-[id number]   Sudden arrhythmia death syndrome    WPW (Wolff-Parkinson-White syndrome)     Current Outpatient Medications on File Prior to Visit  Medication Sig Dispense Refill   apixaban (ELIQUIS) 5 MG TABS tablet Take 1 tablet by mouth twice daily 180 tablet 1   acetaminophen (TYLENOL) 500 MG tablet Take 500 mg by mouth in the morning and at bedtime.     albuterol (VENTOLIN HFA) 108 (90 Base) MCG/ACT inhaler Inhale 2  puffs into the lungs every 6 (six) hours as needed for wheezing. For wheezing. Use 2 puffs 3 times daily x5 days and then every 6 hours as needed. 18 g 5   alendronate (FOSAMAX) 70 MG tablet Take 70 mg by mouth every Sunday.      Calcium Carbonate (CALCIUM 500 PO) Take by mouth. One tablet daily.     carvedilol (COREG) 6.25 MG tablet Take 1 tablet (6.25 mg total) by mouth 2 (two) times daily. 180 tablet 3   cetirizine (ZYRTEC) 10 MG tablet Take 10 mg by mouth daily.     Cholecalciferol (VITAMIN D3) 5000 units CAPS Take 5,000 Units by mouth daily.     Cyanocobalamin (VITAMIN B-12) 5000 MCG LOZG Take 5,000 mcg by mouth daily.     empagliflozin (JARDIANCE) 10 MG TABS tablet Take 1 tablet (10 mg total) by mouth daily before breakfast. 90 tablet 3   erythromycin ophthalmic ointment Place a 1/2 inch ribbon of ointment into the lower eyelid. 3.5 g 0   escitalopram (LEXAPRO) 20 MG tablet Take 20 mg by mouth daily.     Fluorouracil (TOLAK) 4 % CREA Apply to nost qhs Monday - Sunday x 2 weeks- avoid sunlight 40 g 1   fluticasone (FLONASE) 50 MCG/ACT nasal spray Place 2 sprays into both nostrils daily.     fluticasone furoate-vilanterol (BREO ELLIPTA) 200-25 MCG/INH AEPB Inhale 1 puff by mouth once daily 60 each 5   furosemide (LASIX) 40 MG tablet Take 1 tablet (40 mg total) by mouth daily. 180 tablet 3   gabapentin (NEURONTIN) 100 MG capsule Take 1 capsule (100 mg total) by mouth 3 (three) times daily as needed. 90 capsule 1   hydroxypropyl methylcellulose / hypromellose (ISOPTO TEARS / GONIOVISC) 2.5 % ophthalmic solution Place 1 drop into the right eye 4 (four) times daily as needed for dry eyes. 15 mL 0   ipratropium-albuterol (DUONEB) 0.5-2.5 (3) MG/3ML SOLN Take 3 mLs by nebulization every 4 (four) hours as needed. 360 mL 1   Multiple Vitamins-Minerals (CENTRUM SILVER 50+WOMEN) TABS Take 1 tablet by mouth daily with breakfast.      pantoprazole (PROTONIX) 40 MG tablet Take 40 mg by mouth daily.  0    rosuvastatin (CRESTOR) 5 MG tablet Take 5 mg by mouth daily.     sacubitril-valsartan (ENTRESTO) 24-26 MG Take 1 tablet by mouth 2 (two) times daily. 180 tablet 3   spironolactone (ALDACTONE) 25 MG tablet Take 1 tablet by mouth once daily 90 tablet 3  No current facility-administered medications on file prior to visit.    Allergies  Allergen Reactions   Ivp Dye [Iodinated Contrast Media] Hives and Other (See Comments)    "Airway swelling" per pt    Penicillins Anaphylaxis    Has patient had a PCN reaction causing immediate rash, facial/tongue/throat swelling, SOB or lightheadedness with hypotension: Yes Has patient had a PCN reaction causing severe rash involving mucus membranes or skin necrosis: No Has patient had a PCN reaction that required hospitalization No Has patient had a PCN reaction occurring within the last 10 years: No If all of the above answers are "NO", then may proceed with Cephalosporin use.   Sulfonamide Derivatives Other (See Comments)    Migraines   Tessalon [Benzonatate] Hives and Rash    Severe rash   Sulfa Antibiotics Other (See Comments)    Migraines   Rosuvastatin Calcium Other (See Comments)    Reaction not recalled by the patient   Onion Other (See Comments)    Raw onions cause migraines     Assessment/Plan:  1. CHF - Blood pressure is good at 118/68. She only checks at home when she feels off. I have asked her to start checking daily. She feels a lot better after medication changes. We discussed adding a SGLT2 inhibitor. Patient does not have any hx of UTI/genital infections. Will check BMP today since switch to [facility]. If stable, will plan to start Jardiance 10mg  daily and decrease furosemide to 60mg  daily. I will call patient with results either at the end of today or Tuesday depending on when they result. Follow up in office in ~2 weeks. At that time, if BP allows will increase Entresto.   Thank you,  Ramond Dial, Pharm.D, BCPS, CPP [place]  [address] [place], [place] [address]  Phone: [phone]; Fax: [phone]

## 2021-04-28 DIAGNOSIS — M109 Gout, unspecified: Secondary | ICD-10-CM | POA: Diagnosis not present

## 2021-05-03 ENCOUNTER — Ambulatory Visit (INDEPENDENT_AMBULATORY_CARE_PROVIDER_SITE_OTHER): Payer: Medicare HMO

## 2021-05-03 DIAGNOSIS — I5022 Chronic systolic (congestive) heart failure: Secondary | ICD-10-CM

## 2021-05-03 DIAGNOSIS — Z95810 Presence of automatic (implantable) cardiac defibrillator: Secondary | ICD-10-CM

## 2021-05-04 NOTE — Progress Notes (Signed)
EPIC Encounter for ICM Monitoring  Patient Name: Paula Booker is a 76 y.o. female Date: 05/04/2021 Primary Care Physican: [name], MD Primary Cardiologist: Irish Lack Electrophysiologist: Lovena Le 05/04/2021 Weight: 197 lbs   Time in AT/AF  <0.1 hr/day (<0.1%)         Spoke with patient and heart failure questions reviewed.  She has gout and placed on prednisone.   Optivol thoracic impedance suggesting normal fluid levels.   Prescribed:  Furosemide 40 mg take 1 tablet (40 mg total) by mouth daily (decreased on 04/13/21 when started on Jardiance) Spironolactone 25 mg take 1 tablet by mouth daily   Labs: 10/29/2020 Creatinine 0.88, BUN 12, Potassium 3.7, Sodium 139, GFR >60 05/30/2020 Creatinine 0.97, BUN 22, Potassium 4.0, Sodium 136, GFR 61 Care Everywhere A complete set of results can be found in Results Review.   Recommendations:  Encouraged to call if experiencing any fluid symptoms.    Follow-up plan: ICM clinic phone appointment on 06/07/2021.   91 day device clinic remote transmission 07/05/2021.     EP/Cardiology Office Visits:    Recall 04/11/2021 with Dr Irish Lack.     Copy of ICM check sent to Dr. Lovena Le.   3 month ICM trend: 05/03/2021.    12-14 Month ICM trend:     [name], RN 05/04/2021 12:31 PM

## 2021-05-13 ENCOUNTER — Ambulatory Visit (INDEPENDENT_AMBULATORY_CARE_PROVIDER_SITE_OTHER): Payer: Medicare HMO | Admitting: Pharmacist

## 2021-05-13 ENCOUNTER — Encounter: Payer: Self-pay | Admitting: Pharmacist

## 2021-05-13 ENCOUNTER — Other Ambulatory Visit: Payer: Self-pay

## 2021-05-13 VITALS — BP 118/60

## 2021-05-13 DIAGNOSIS — I5023 Acute on chronic systolic (congestive) heart failure: Secondary | ICD-10-CM

## 2021-05-13 DIAGNOSIS — R072 Precordial pain: Secondary | ICD-10-CM

## 2021-05-13 DIAGNOSIS — I428 Other cardiomyopathies: Secondary | ICD-10-CM | POA: Diagnosis not present

## 2021-05-13 LAB — BASIC METABOLIC PANEL
BUN/Creatinine Ratio: 9 — ABNORMAL LOW (ref 12–28)
BUN: 9 mg/dL (ref 8–27)
CO2: 22 mmol/L (ref 20–29)
Calcium: 9.2 mg/dL (ref 8.7–10.3)
Chloride: 103 mmol/L (ref 96–106)
Creatinine, Ser: 0.95 mg/dL (ref 0.57–1.00)
Glucose: 185 mg/dL — ABNORMAL HIGH (ref 70–99)
Potassium: 4.3 mmol/L (ref 3.5–5.2)
Sodium: 140 mmol/L (ref 134–144)
eGFR: 62 mL/min/{1.73_m2} (ref >59)

## 2021-05-13 MED ORDER — CARVEDILOL 12.5 MG PO TABS: 12.5000 mg | ORAL_TABLET | Freq: Two times a day (BID) | ORAL | 3 refills | Status: DC

## 2021-05-13 NOTE — Patient Instructions (Addendum)
Please increase your carvedilol to 12.5mg  twice a day. Continue Entresto 24/26mg  twice a day, spironolactone 25mg  daily, furosemide 40mg  daily, Jardiance 10mg  daily Keep checking blood pressure and heart rate.

## 2021-05-13 NOTE — Progress Notes (Signed)
Patient ID: Paula Booker                 DOB: 13-Jan-1946                      MRN: [id number]     HPI: Paula Booker is a 76 y.o. female referred by Dr. Lovena Le to pharmacy clinic for HF medication management. PMH is significant for h/o ICM, chronic systolic heart failure, syncope, and a WPW pattern on her ECG. Most recent LVEF 25-30% on 07/01/19.  Today she presents to pharmacy clinic for medication titration. At last visit with PharmD, Paula Booker 10mg  daily was started and furosemide was decreased to 40mg  daily.  She was asked to start checking her blood pressure and weight at home. Today, symptomatically, she is feeling great, denies dizziness, lightheadedness, and fatigue. Feels like she has the energy she needs to do everything she needs to do. Able to complete all ADLs. Activity level high. Is making her porch into a pantry. Doing all the work herself. Very excited about her two new saws. States her knees don't even hurt her anymore and she is sleeping all night long. No LEE, PND, or orthopnea. Takes furosemide 40mg  daily. Appetite has been good. She adheres to a low-salt diet.  Cost of Delene Loll is fine. Patient has both medicare and medicaid and pays $4. Requesting a 90 day supply. She requested a 90 day supply on Entresto be sent.  She brings in her home BP wrist cuff. Once patient crossed arm across her upper chest instead of below her breast, reading was accurate compared to clinic reading. 118/60 vs 112/60.  Current CHF meds: Entresto 24/26mg  twice a day, carvedilol 6.25mg  twice a day, spironolactone 25mg  daily, furosemide 40mg  daily, Jardiance 10mg  daily Previously tried: losartan (changed to [name]) BP goal: <130/80  Family History:  Family History  Problem Relation Age of Onset   Congestive Heart Failure Mother        died 43   Kidney Stones Mother    Deafness Mother    Atrial fibrillation Mother    Congestive Heart Failure Father    Deafness Father    Healthy Sister     Healthy Sister    Healthy Sister    Congestive Heart Failure Brother    Atrial fibrillation Brother    Tuberculosis Paternal Grandfather    Migraines Neg Hx    Neural tube defect Neg Hx      Social History:  Social History   Socioeconomic History   Marital status: Divorced    Spouse name: Not on file   Number of children: 3   Years of education: Not on file   Highest education level: Not on file  Occupational History   Occupation: retired    Comment: Optometrist  Tobacco Use   Smoking status: Never   Smokeless tobacco: Never  Vaping Use   Vaping Use: Never used  Substance and Sexual Activity   Alcohol use: Not Currently    Comment: rare   Drug use: No   Sexual activity: Not Currently  Other Topics Concern   Not on file  Social History Narrative   Divorced, 3 children, 9 grandchildren. Did work for [employer]. Retired at 76 y/o .   Social Determinants of Health   Financial Resource Strain: Not on file  Food Insecurity: Not on file  Transportation Needs: Not on file  Physical Activity: Not on file  Stress: Not on file  Social Connections:  Not on file  Intimate Partner Violence: Not on file     Diet: gingerale  Exercise: wood working  Home BP readings: 111/60, 156/81, 122/75, 129/67, 108/59, 150/76, 122/69, 134/71, 116/64, 108/61, , 97/53, 113/68, 119/62, 152/86, 153/82, 125/70, 136/79, 142/86 HR 70-80's  Wt Readings from Last 3 Encounters:  03/18/21 204 lb (92.5 kg)  11/05/20 206 lb (93.4 kg)  04/16/20 208 lb 12.8 oz (94.7 kg)   BP Readings from Last 3 Encounters:  04/09/21 118/68  03/18/21 118/64  11/05/20 122/68   Pulse Readings from Last 3 Encounters:  04/09/21 74  03/18/21 74  11/05/20 79    Renal function: CrCl cannot be calculated (Patient's most recent lab result is older than the maximum 21 days allowed.).  Past Medical History:  Diagnosis Date   AICD (automatic cardioverter/defibrillator) present    Anemia    as a child    Anxiety    Arthritis    "hands and knees" (03/22/2012)   Asthma    Basal cell carcinoma 04/27/2011   mid forehead([facility])   Bursitis    CHF (congestive heart failure) ([place]) 09/2011   pt. denies   Depression    Dysrhythmia    WPW   GERD (gastroesophageal reflux disease)    Head injury, acute, with loss of consciousness ([place])    History of hiatal hernia    Hypertension    ICD (implantable cardiac defibrillator) in place 03/2012   Migraines    migraines    NICM (nonischemic cardiomyopathy) (HCC)    Obesity (BMI 30-39.9)    negative sleep study 2014   OSA (obstructive sleep apnea) 05/27/2018   Mild with AHI 6/hr    no cpap   Pneumonia    hx   Shortness of breath    "@ any time I can get SOB" (03/22/2012)   Skin cancer 1999   Squamous cell carcinoma of skin 07/08/2020   in situ right malar cheek-[id number]   Sudden arrhythmia death syndrome    WPW (Wolff-Parkinson-White syndrome)     Current Outpatient Medications on File Prior to Visit  Medication Sig Dispense Refill   apixaban (ELIQUIS) 5 MG TABS tablet Take 1 tablet by mouth twice daily 180 tablet 1   acetaminophen (TYLENOL) 500 MG tablet Take 500 mg by mouth in the morning and at bedtime.     albuterol (VENTOLIN HFA) 108 (90 Base) MCG/ACT inhaler Inhale 2 puffs into the lungs every 6 (six) hours as needed for wheezing. For wheezing. Use 2 puffs 3 times daily x5 days and then every 6 hours as needed. 18 g 5   alendronate (FOSAMAX) 70 MG tablet Take 70 mg by mouth every Sunday.      Calcium Carbonate (CALCIUM 500 PO) Take by mouth. One tablet daily.     carvedilol (COREG) 6.25 MG tablet Take 1 tablet (6.25 mg total) by mouth 2 (two) times daily. 180 tablet 3   cetirizine (ZYRTEC) 10 MG tablet Take 10 mg by mouth daily.     Cholecalciferol (VITAMIN D3) 5000 units CAPS Take 5,000 Units by mouth daily.     Cyanocobalamin (VITAMIN B-12) 5000 MCG LOZG Take 5,000 mcg by mouth daily.     empagliflozin (JARDIANCE) 10 MG TABS tablet Take  1 tablet (10 mg total) by mouth daily before breakfast. 90 tablet 3   erythromycin ophthalmic ointment Place a 1/2 inch ribbon of ointment into the lower eyelid. 3.5 g 0   escitalopram (LEXAPRO) 20 MG tablet Take 20 mg by mouth daily.  Fluorouracil (TOLAK) 4 % CREA Apply to nost qhs Monday - Sunday x 2 weeks- avoid sunlight 40 g 1   fluticasone (FLONASE) 50 MCG/ACT nasal spray Place 2 sprays into both nostrils daily.     fluticasone furoate-vilanterol (BREO ELLIPTA) 200-25 MCG/INH AEPB Inhale 1 puff by mouth once daily 60 each 5   furosemide (LASIX) 40 MG tablet Take 1 tablet (40 mg total) by mouth daily. 180 tablet 3   gabapentin (NEURONTIN) 100 MG capsule Take 1 capsule (100 mg total) by mouth 3 (three) times daily as needed. 90 capsule 1   hydroxypropyl methylcellulose / hypromellose (ISOPTO TEARS / GONIOVISC) 2.5 % ophthalmic solution Place 1 drop into the right eye 4 (four) times daily as needed for dry eyes. 15 mL 0   ipratropium-albuterol (DUONEB) 0.5-2.5 (3) MG/3ML SOLN Take 3 mLs by nebulization every 4 (four) hours as needed. 360 mL 1   Multiple Vitamins-Minerals (CENTRUM SILVER 50+WOMEN) TABS Take 1 tablet by mouth daily with breakfast.      pantoprazole (PROTONIX) 40 MG tablet Take 40 mg by mouth daily.  0   rosuvastatin (CRESTOR) 5 MG tablet Take 5 mg by mouth daily.     sacubitril-valsartan (ENTRESTO) 24-26 MG Take 1 tablet by mouth 2 (two) times daily. 180 tablet 3   spironolactone (ALDACTONE) 25 MG tablet Take 1 tablet by mouth once daily 90 tablet 3   No current facility-administered medications on file prior to visit.    Allergies  Allergen Reactions   Ivp Dye [Iodinated Contrast Media] Hives and Other (See Comments)    "Airway swelling" per pt    Penicillins Anaphylaxis    Has patient had a PCN reaction causing immediate rash, facial/tongue/throat swelling, SOB or lightheadedness with hypotension: Yes Has patient had a PCN reaction causing severe rash involving mucus  membranes or skin necrosis: No Has patient had a PCN reaction that required hospitalization No Has patient had a PCN reaction occurring within the last 10 years: No If all of the above answers are "NO", then may proceed with Cephalosporin use.   Sulfonamide Derivatives Other (See Comments)    Migraines   Tessalon [Benzonatate] Hives and Rash    Severe rash   Sulfa Antibiotics Other (See Comments)    Migraines   Rosuvastatin Calcium Other (See Comments)    Reaction not recalled by the patient   Onion Other (See Comments)    Raw onions cause migraines     Assessment/Plan:  1. CHF - Blood pressure is good at 118/60. Her home cuff is accurate, but technique was not correct. This was corrected. Prior home reading inaccurate. HR has plenty of room based off of home results. Her carvedilol was increased to 12.5mg  BID previous, however it was decreased due to fatigue. Patient was also fluid overloaded at the time. Patient willing to try again to increase carvedilol to 12.5mg  BID. Continue Entresto 24/26mg  twice a day, spironolactone 25mg  daily, furosemide 40mg  daily and Jardiance 10mg  daily. Follow up in 3 weeks. Checking BMP today since starting Jardiance.  Thank you,  Ramond Dial, Pharm.D, BCPS, CPP [place]  [address] [place], [place] [address]  Phone: [phone]; Fax: [phone]

## 2021-05-14 DIAGNOSIS — I480 Paroxysmal atrial fibrillation: Secondary | ICD-10-CM | POA: Diagnosis not present

## 2021-05-14 DIAGNOSIS — M1000 Idiopathic gout, unspecified site: Secondary | ICD-10-CM | POA: Diagnosis not present

## 2021-05-14 DIAGNOSIS — Z95810 Presence of automatic (implantable) cardiac defibrillator: Secondary | ICD-10-CM | POA: Diagnosis not present

## 2021-05-14 DIAGNOSIS — E782 Mixed hyperlipidemia: Secondary | ICD-10-CM | POA: Diagnosis not present

## 2021-05-14 DIAGNOSIS — I509 Heart failure, unspecified: Secondary | ICD-10-CM | POA: Diagnosis not present

## 2021-05-14 DIAGNOSIS — I700 Atherosclerosis of aorta: Secondary | ICD-10-CM | POA: Diagnosis not present

## 2021-05-14 DIAGNOSIS — I10 Essential (primary) hypertension: Secondary | ICD-10-CM | POA: Diagnosis not present

## 2021-05-14 DIAGNOSIS — F331 Major depressive disorder, recurrent, moderate: Secondary | ICD-10-CM | POA: Diagnosis not present

## 2021-05-14 DIAGNOSIS — I428 Other cardiomyopathies: Secondary | ICD-10-CM | POA: Diagnosis not present

## 2021-05-20 DIAGNOSIS — Z79899 Other long term (current) drug therapy: Secondary | ICD-10-CM | POA: Diagnosis not present

## 2021-05-20 DIAGNOSIS — E782 Mixed hyperlipidemia: Secondary | ICD-10-CM | POA: Diagnosis not present

## 2021-05-27 ENCOUNTER — Other Ambulatory Visit: Payer: Self-pay | Admitting: Interventional Cardiology

## 2021-06-04 ENCOUNTER — Ambulatory Visit (INDEPENDENT_AMBULATORY_CARE_PROVIDER_SITE_OTHER): Payer: Medicare HMO | Admitting: Pharmacist

## 2021-06-04 ENCOUNTER — Other Ambulatory Visit: Payer: Self-pay

## 2021-06-04 VITALS — BP 102/65 | HR 79

## 2021-06-04 DIAGNOSIS — I5023 Acute on chronic systolic (congestive) heart failure: Secondary | ICD-10-CM

## 2021-06-04 MED ORDER — CARVEDILOL 6.25 MG PO TABS: 6.2500 mg | ORAL_TABLET | Freq: Two times a day (BID) | ORAL | 3 refills | Status: DC

## 2021-06-04 NOTE — Patient Instructions (Addendum)
Decrease carvedilol to 6.25mg  twice a day Continue Entresto 24/26mg  twice a day, spironolactone 25mg  daily, furosemide 40mg  daily, Jardiance 10mg  daily If you don't see any improvement with fatigue with decrease of carvedilol

## 2021-06-04 NOTE — Progress Notes (Signed)
Patient ID: Paula Booker                 DOB: Jul 23, 1945                      MRN: [id number]     HPI: Paula Booker is a 76 y.o. female referred by Dr. Lovena Le to pharmacy clinic for HF medication management. PMH is significant for h/o ICM, chronic systolic heart failure, syncope, and a WPW pattern on her ECG. Most recent LVEF 25-30% on 07/01/19.  Today she presents to pharmacy clinic for medication titration. At last visit with PharmD, carvedilol was increased to 12.5mg  twice a day. Her home BP cuff was found to be accurate after her technique was corrected. Today, symptomatically, she is feeling tired, denies dizziness, lightheadedness. States she feels tired. Drove to [facility] and back 2 days in a row. States she was in bed for the next two days. Able to complete all ADLs. Activity level still seems to be high. Is making her porch into a pantry. Doing all the work herself. No LEE, PND, or orthopnea. Takes furosemide 40mg  daily. Appetite has been good. She adheres to a low-salt diet.  Cost of Delene Loll is fine. Patient has both medicare and medicaid and pays $4. Requesting a 90 day supply. She requested a 90 day supply on Entresto be sent.  Has decided not to go [place] for her grandson wedding.  Current CHF meds: Entresto 24/26mg  twice a day, carvedilol 12.5mg  twice a day, spironolactone 25mg  daily, furosemide 40mg  daily, Jardiance 10mg  daily Previously tried: losartan (changed to [name]) BP goal: <130/80  Family History:  Family History  Problem Relation Age of Onset   Congestive Heart Failure Mother        died 79   Kidney Stones Mother    Deafness Mother    Atrial fibrillation Mother    Congestive Heart Failure Father    Deafness Father    Healthy Sister    Healthy Sister    Healthy Sister    Congestive Heart Failure Brother    Atrial fibrillation Brother    Tuberculosis Paternal Grandfather    Migraines Neg Hx    Neural tube defect Neg Hx      Social History:   Social History   Socioeconomic History   Marital status: Divorced    Spouse name: Not on file   Number of children: 3   Years of education: Not on file   Highest education level: Not on file  Occupational History   Occupation: retired    Comment: Optometrist  Tobacco Use   Smoking status: Never   Smokeless tobacco: Never  Vaping Use   Vaping Use: Never used  Substance and Sexual Activity   Alcohol use: Not Currently    Comment: rare   Drug use: No   Sexual activity: Not Currently  Other Topics Concern   Not on file  Social History Narrative   Divorced, 3 children, 9 grandchildren. Did work for [employer]. Retired at 76 y/o .   Social Determinants of Health   Financial Resource Strain: Not on file  Food Insecurity: Not on file  Transportation Needs: Not on file  Physical Activity: Not on file  Stress: Not on file  Social Connections: Not on file  Intimate Partner Violence: Not on file    Diet: gingerale  Exercise: wood working  Home BP readings: 124/64, 116/61, 134/71, 119/64, 122/70, 124/71, 132/78, 118/70, 144/84, 100/58, 118/70, 123/74,  127/59, 134/70, 118/76, 113/68, 107/60, 104/58, 110/70 HR 67-94  Wt Readings from Last 3 Encounters:  03/18/21 204 lb (92.5 kg)  11/05/20 206 lb (93.4 kg)  04/16/20 208 lb 12.8 oz (94.7 kg)   BP Readings from Last 3 Encounters:  05/13/21 118/60  04/09/21 118/68  03/18/21 118/64   Pulse Readings from Last 3 Encounters:  04/09/21 74  03/18/21 74  11/05/20 79    Renal function: CrCl cannot be calculated (Patient's most recent lab result is older than the maximum 21 days allowed.).  Past Medical History:  Diagnosis Date   AICD (automatic cardioverter/defibrillator) present    Anemia    as a child   Anxiety    Arthritis    "hands and knees" (03/22/2012)   Asthma    Basal cell carcinoma 04/27/2011   mid forehead([facility])   Bursitis    CHF (congestive heart failure) ([facility]) 09/2011   pt. denies   Depression     Dysrhythmia    WPW   GERD (gastroesophageal reflux disease)    Head injury, acute, with loss of consciousness ([facility])    History of hiatal hernia    Hypertension    ICD (implantable cardiac defibrillator) in place 03/2012   Migraines    migraines    NICM (nonischemic cardiomyopathy) ([facility])    Obesity (BMI 30-39.9)    negative sleep study 2014   OSA (obstructive sleep apnea) 05/27/2018   Mild with AHI 6/hr    no cpap   Pneumonia    hx   Shortness of breath    "@ any time I can get SOB" (03/22/2012)   Skin cancer 1999   Squamous cell carcinoma of skin 07/08/2020   in situ right malar cheek-[id number]   Sudden arrhythmia death syndrome    WPW (Wolff-Parkinson-White syndrome)     Current Outpatient Medications on File Prior to Visit  Medication Sig Dispense Refill   apixaban (ELIQUIS) 5 MG TABS tablet Take 1 tablet by mouth twice daily 180 tablet 1   acetaminophen (TYLENOL) 500 MG tablet Take 500 mg by mouth in the morning and at bedtime.     albuterol (VENTOLIN HFA) 108 (90 Base) MCG/ACT inhaler Inhale 2 puffs into the lungs every 6 (six) hours as needed for wheezing. For wheezing. Use 2 puffs 3 times daily x5 days and then every 6 hours as needed. 18 g 5   alendronate (FOSAMAX) 70 MG tablet Take 70 mg by mouth every Sunday.      Calcium Carbonate (CALCIUM 500 PO) Take by mouth. One tablet daily.     carvedilol (COREG) 12.5 MG tablet Take 1 tablet (12.5 mg total) by mouth 2 (two) times daily. 180 tablet 3   cetirizine (ZYRTEC) 10 MG tablet Take 10 mg by mouth daily.     Cholecalciferol (VITAMIN D3) 5000 units CAPS Take 5,000 Units by mouth daily.     Cyanocobalamin (VITAMIN B-12) 5000 MCG LOZG Take 5,000 mcg by mouth daily.     empagliflozin (JARDIANCE) 10 MG TABS tablet Take 1 tablet (10 mg total) by mouth daily before breakfast. 90 tablet 3   erythromycin ophthalmic ointment Place a 1/2 inch ribbon of ointment into the lower eyelid. 3.5 g 0   escitalopram (LEXAPRO) 20 MG tablet Take 20  mg by mouth daily.     Fluorouracil (TOLAK) 4 % CREA Apply to nost qhs Monday - Sunday x 2 weeks- avoid sunlight 40 g 1   fluticasone (FLONASE) 50 MCG/ACT nasal spray Place 2 sprays into  both nostrils daily.     fluticasone furoate-vilanterol (BREO ELLIPTA) 200-25 MCG/INH AEPB Inhale 1 puff by mouth once daily 60 each 5   furosemide (LASIX) 40 MG tablet TAKE 1 TO 2 TABLETS BY MOUTH AS DIRECTED ONCE DAILY FOR EDEMA ,  SHORTNESS  OF  BREATH  OR  WEIGHT  GAIN 180 tablet 1   gabapentin (NEURONTIN) 100 MG capsule Take 1 capsule (100 mg total) by mouth 3 (three) times daily as needed. 90 capsule 1   hydroxypropyl methylcellulose / hypromellose (ISOPTO TEARS / GONIOVISC) 2.5 % ophthalmic solution Place 1 drop into the right eye 4 (four) times daily as needed for dry eyes. 15 mL 0   ipratropium-albuterol (DUONEB) 0.5-2.5 (3) MG/3ML SOLN Take 3 mLs by nebulization every 4 (four) hours as needed. 360 mL 1   Multiple Vitamins-Minerals (CENTRUM SILVER 50+WOMEN) TABS Take 1 tablet by mouth daily with breakfast.      pantoprazole (PROTONIX) 40 MG tablet Take 40 mg by mouth daily.  0   rosuvastatin (CRESTOR) 5 MG tablet Take 5 mg by mouth daily.     sacubitril-valsartan (ENTRESTO) 24-26 MG Take 1 tablet by mouth 2 (two) times daily. 180 tablet 3   spironolactone (ALDACTONE) 25 MG tablet Take 1 tablet by mouth once daily 90 tablet 0   No current facility-administered medications on file prior to visit.    Allergies  Allergen Reactions   Ivp Dye [Iodinated Contrast Media] Hives and Other (See Comments)    "Airway swelling" per pt    Penicillins Anaphylaxis    Has patient had a PCN reaction causing immediate rash, facial/tongue/throat swelling, SOB or lightheadedness with hypotension: Yes Has patient had a PCN reaction causing severe rash involving mucus membranes or skin necrosis: No Has patient had a PCN reaction that required hospitalization No Has patient had a PCN reaction occurring within the last 10  years: No If all of the above answers are "NO", then may proceed with Cephalosporin use.   Sulfonamide Derivatives Other (See Comments)    Migraines   Tessalon [Benzonatate] Hives and Rash    Severe rash   Sulfa Antibiotics Other (See Comments)    Migraines   Rosuvastatin Calcium Other (See Comments)    Reaction not recalled by the patient   Onion Other (See Comments)    Raw onions cause migraines     Assessment/Plan:  1. CHF - Blood pressure is good at 102/65. Her home blood pressure is also well controlled. She has a few in the 130's but also several in the low 100's. Will decrease carvedilol back to 6.25mg  BID due to fatigue. Continue Entresto 24/26mg  twice a day, spironolactone 25mg  daily, furosemide 40mg  daily and Jardiance 10mg  daily. Im not confident her BP would handle higher dose of Entresto. She felt great prior to increasing carvedilol. Will follow up as needed.  Thank you,  Ramond Dial, Pharm.D, BCPS, CPP [facility]  [address] [place], [place] [address]  Phone: [phone]; Fax: [phone]

## 2021-06-07 ENCOUNTER — Ambulatory Visit (INDEPENDENT_AMBULATORY_CARE_PROVIDER_SITE_OTHER): Payer: Medicare HMO

## 2021-06-07 DIAGNOSIS — Z95810 Presence of automatic (implantable) cardiac defibrillator: Secondary | ICD-10-CM

## 2021-06-07 DIAGNOSIS — I5022 Chronic systolic (congestive) heart failure: Secondary | ICD-10-CM

## 2021-06-09 NOTE — Progress Notes (Signed)
EPIC Encounter for ICM Monitoring  Patient Name: Paula Booker is a 76 y.o. female Date: 06/09/2021 Primary Care Physican: [name], MD Primary Cardiologist: Irish Lack Electrophysiologist: Lovena Le 05/04/2021 Weight: 197 lbs   Time in AT/AF  <0.1 hr/day (<0.1%)         Spoke with patient and heart failure questions reviewed.  Pt asymptomatic for fluid accumulation.  Reports feeling tired today because she did not sleep last night.  Medications have been adjusted and has more energy.   Optivol thoracic impedance suggesting normal fluid levels.   Prescribed:  Furosemide 40 mg take 1 tablet (40 mg total) by mouth daily (decreased on 04/13/21 when started on Jardiance) Spironolactone 25 mg take 1 tablet by mouth daily   Labs: 05/13/2021 Creatinine 0.95, BUN 9,   Potassium 4.3, Sodium 140, GFR 62 04/09/2021 Creatinine 0.96, BUN 12, Potassium 4.4, Sodium 138, GFR 62 10/29/2020 Creatinine 0.88, BUN 12, Potassium 3.7, Sodium 139, GFR >60 05/30/2020 Creatinine 0.97, BUN 22, Potassium 4.0, Sodium 136, GFR 61 Care Everywhere A complete set of results can be found in Results Review.   Recommendations:  Encouraged to call if experiencing any fluid symptoms.    Follow-up plan: ICM clinic phone appointment on 07/12/2021.   91 day device clinic remote transmission 07/05/2021.     EP/Cardiology Office Visits:    08/19/2021 with Dr Irish Lack.     Copy of ICM check sent to Dr. Lovena Le.   3 month ICM trend: 06/08/2021.    12-14 Month ICM trend:     [name], RN 06/09/2021 12:42 PM

## 2021-06-16 ENCOUNTER — Emergency Department (HOSPITAL_COMMUNITY): Payer: Medicare HMO

## 2021-06-16 ENCOUNTER — Encounter (HOSPITAL_COMMUNITY): Payer: Self-pay | Admitting: Emergency Medicine

## 2021-06-16 ENCOUNTER — Encounter: Payer: Self-pay | Admitting: Internal Medicine

## 2021-06-16 ENCOUNTER — Emergency Department (HOSPITAL_COMMUNITY)
Admission: EM | Admit: 2021-06-16 | Discharge: 2021-06-16 | Disposition: A | Discharge status: Home / Self Care | Payer: Medicare HMO | Attending: Emergency Medicine | Admitting: Emergency Medicine

## 2021-06-16 DIAGNOSIS — I447 Left bundle-branch block, unspecified: Secondary | ICD-10-CM | POA: Diagnosis not present

## 2021-06-16 DIAGNOSIS — R079 Chest pain, unspecified: Secondary | ICD-10-CM | POA: Diagnosis not present

## 2021-06-16 DIAGNOSIS — I5022 Chronic systolic (congestive) heart failure: Secondary | ICD-10-CM | POA: Diagnosis not present

## 2021-06-16 DIAGNOSIS — R0789 Other chest pain: Secondary | ICD-10-CM | POA: Diagnosis not present

## 2021-06-16 DIAGNOSIS — K5730 Diverticulosis of large intestine without perforation or abscess without bleeding: Secondary | ICD-10-CM | POA: Diagnosis not present

## 2021-06-16 DIAGNOSIS — M545 Low back pain, unspecified: Secondary | ICD-10-CM | POA: Diagnosis not present

## 2021-06-16 DIAGNOSIS — M549 Dorsalgia, unspecified: Secondary | ICD-10-CM | POA: Diagnosis not present

## 2021-06-16 DIAGNOSIS — Z7901 Long term (current) use of anticoagulants: Secondary | ICD-10-CM | POA: Insufficient documentation

## 2021-06-16 DIAGNOSIS — K760 Fatty (change of) liver, not elsewhere classified: Secondary | ICD-10-CM | POA: Diagnosis not present

## 2021-06-16 LAB — BASIC METABOLIC PANEL
Anion gap: 10 (ref 5–15)
BUN: 16 mg/dL (ref 8–23)
CO2: 24 mmol/L (ref 22–32)
Calcium: 9.5 mg/dL (ref 8.9–10.3)
Chloride: 104 mmol/L (ref 98–111)
Creatinine, Ser: 0.9 mg/dL (ref 0.44–1.00)
GFR, Estimated: >60 mL/min (ref >60)
Glucose, Bld: 125 mg/dL — ABNORMAL HIGH (ref 70–99)
Potassium: 4.2 mmol/L (ref 3.5–5.1)
Sodium: 138 mmol/L (ref 135–145)

## 2021-06-16 LAB — CBC WITH DIFFERENTIAL/PLATELET
Abs Immature Granulocytes: 0.03 10*3/uL (ref 0.00–0.07)
Basophils Absolute: 0.1 10*3/uL (ref 0.0–0.1)
Basophils Relative: 1 %
Eosinophils Absolute: 0.6 10*3/uL — ABNORMAL HIGH (ref 0.0–0.5)
Eosinophils Relative: 6 %
HCT: 43.6 % (ref 36.0–46.0)
Hemoglobin: 14.3 g/dL (ref 12.0–15.0)
Immature Granulocytes: 0 %
Lymphocytes Relative: 36 %
Lymphs Abs: 3.3 10*3/uL (ref 0.7–4.0)
MCH: 30.8 pg (ref 26.0–34.0)
MCHC: 32.8 g/dL (ref 30.0–36.0)
MCV: 94 fL (ref 80.0–100.0)
Monocytes Absolute: 0.8 10*3/uL (ref 0.1–1.0)
Monocytes Relative: 8 %
Neutro Abs: 4.5 10*3/uL (ref 1.7–7.7)
Neutrophils Relative %: 49 %
Platelets: 250 10*3/uL (ref 150–400)
RBC: 4.64 MIL/uL (ref 3.87–5.11)
RDW: 12.3 % (ref 11.5–15.5)
WBC: 9.3 10*3/uL (ref 4.0–10.5)
nRBC: 0 % (ref 0.0–0.2)

## 2021-06-16 LAB — TROPONIN I (HIGH SENSITIVITY)
Troponin I (High Sensitivity): 7 ng/L (ref <18)
Troponin I (High Sensitivity): 8 ng/L (ref <18)

## 2021-06-16 MED ORDER — FENTANYL CITRATE PF 50 MCG/ML IJ SOSY
50.0000 ug | PREFILLED_SYRINGE | Freq: Once | INTRAMUSCULAR | Status: AC
  Administered 2021-06-16: 50 ug via INTRAVENOUS
  Filled 2021-06-16: qty 1

## 2021-06-16 MED ORDER — HYDROCORTISONE SOD SUC (PF) 100 MG IJ SOLR
200.0000 mg | Freq: Once | INTRAMUSCULAR | Status: AC
  Administered 2021-06-16: 200 mg via INTRAVENOUS
  Filled 2021-06-16: qty 4

## 2021-06-16 MED ORDER — IOHEXOL 350 MG/ML SOLN
100.0000 mL | Freq: Once | INTRAVENOUS | Status: AC | PRN
  Administered 2021-06-16: 100 mL via INTRAVENOUS

## 2021-06-16 MED ORDER — DIPHENHYDRAMINE HCL 50 MG/ML IJ SOLN
50.0000 mg | Freq: Once | INTRAMUSCULAR | Status: AC
  Administered 2021-06-16: 50 mg via INTRAVENOUS
  Filled 2021-06-16: qty 1

## 2021-06-16 MED ORDER — CYCLOBENZAPRINE HCL 10 MG PO TABS: 10.0000 mg | ORAL_TABLET | Freq: Two times a day (BID) | ORAL | 0 refills | Status: DC | PRN

## 2021-06-16 MED ORDER — LIDOCAINE 5 % EX PTCH: 1.0000 | MEDICATED_PATCH | CUTANEOUS | 0 refills | Status: DC

## 2021-06-16 MED ORDER — DIPHENHYDRAMINE HCL 25 MG PO CAPS
50.0000 mg | ORAL_CAPSULE | Freq: Once | ORAL | Status: AC
  Filled 2021-06-16: qty 2

## 2021-06-16 MED ORDER — LIDOCAINE 5 % EX PTCH
1.0000 | MEDICATED_PATCH | CUTANEOUS | Status: DC
  Administered 2021-06-16: 1 via TRANSDERMAL
  Filled 2021-06-16: qty 1

## 2021-06-16 NOTE — ED Provider Notes (Signed)
?  Physical Exam  ?BP 118/60   Pulse (!) 59   Temp 98.2 ?F (36.8 ?C) (Oral)   Resp 14   SpO2 96%  ? ? ? ?Procedures  ?Procedures ? ?ED Course / MDM  ?  ?Medical Decision Making ?Amount and/or Complexity of Data Reviewed ?Labs: ordered. ?Radiology: ordered. ? ?Risk ?Prescription drug management. ? ? ?76 year old female presenting to the emergency department with acute onset chest and back pain with concern that her ICD fired.  No evidence for ICD firing on review.  Please see prior ED providers MDM for initial work-up and management.  Concern for possible aortic pathology so the patient was ordered CTA dissection study but needed premedication prior to the study at signout.  The patient was premedicated underwent CTA imaging which revealed no acute findings in the chest abdomen or pelvis. ? ? ?  ?IMPRESSION:  ?There is no evidence of dissection in the thoracic and abdominal  ?aorta. Major branches of thoracic and abdominal aorta are patent.  ?There is no evidence of mediastinal or retroperitoneal hematoma.  ?   ?There are no focal pulmonary infiltrates.  ?   ?Nodularity in the liver surface suggests cirrhosis. There is no  ?evidence of intestinal obstruction or pneumoperitoneum. There is no  ?hydronephrosis. Scattered diverticula are seen in the colon without  ?signs of focal diverticulitis. Right renal cyst.  ?   ?Other findings as described in the body of the report.  ? ? ?On recent [place], the patient was feeling symptomatically improved.  She continued to endorse mild back pain.  No recent falls or trauma or midline tenderness.  A lidocaine patch was applied.  The patient was prescribed lidocaine patches and Flexeril for musculoskeletal back pain.  Overall stable for discharge. ? ?  ?Regan Lemming, MD ?06/16/21 2033 ? ?

## 2021-06-16 NOTE — ED Provider Notes (Signed)
[facility] Provider Note   CSN: [id number] Arrival date & time: 06/16/21  1014     History  No chief complaint on file.   Paula Booker is a 76 y.o. female.  Presenting to the emergency department with concern for possible shock from her defibrillator.  Patient states that she awoke this morning around 1:45 AM and was having severe chest pain and back pain.  States it was primarily back pain but it did seem to radiate up to her chest.  Relatively constant, sharp and stabbing.  Currently 6 out of 10 in severity, not as bad as it was earlier.  States that it felt like when her defibrillator went off in 2014.  States that she yesterday otherwise felt fine.  She does not have any difficulty in breathing at present, no fever or chills or cough.  History of heart failure, s/p ICD, nonischemic cardiomyopathy.  Additional history obtained from chart review, review of last cardiology note.MALALA TRENKAMP is a 76 y.o. female referred by Dr. Lovena Le to pharmacy clinic for HF medication management. PMH is significant for h/o ICM, chronic systolic heart failure, syncope, and a WPW pattern on her ECG. Most recent LVEF 25-30% on 07/01/19.  HPI     Home Medications Prior to Admission medications   Medication Sig Start Date End Date Taking? Authorizing Provider  apixaban (ELIQUIS) 5 MG TABS tablet Take 1 tablet by mouth twice daily 03/25/21   Evans Lance, MD  acetaminophen (TYLENOL) 500 MG tablet Take 500 mg by mouth in the morning and at bedtime.    [provider]  albuterol (VENTOLIN HFA) 108 (90 Base) MCG/ACT inhaler Inhale 2 puffs into the lungs every 6 (six) hours as needed for wheezing. For wheezing. Use 2 puffs 3 times daily x5 days and then every 6 hours as needed. 01/14/20   Mannam, Hart Robinsons, MD  alendronate (FOSAMAX) 70 MG tablet Take 70 mg by mouth every Sunday.     [provider]  Calcium Carbonate (CALCIUM 500 PO) Take by mouth.  One tablet daily.    [provider]  carvedilol (COREG) 6.25 MG tablet Take 1 tablet (6.25 mg total) by mouth 2 (two) times daily. 06/04/21   Evans Lance, MD  cetirizine (ZYRTEC) 10 MG tablet Take 10 mg by mouth daily.    [provider]  Cholecalciferol (VITAMIN D3) 5000 units CAPS Take 5,000 Units by mouth daily.    [provider]  Cyanocobalamin (VITAMIN B-12) 5000 MCG LOZG Take 5,000 mcg by mouth daily.    [provider]  empagliflozin (JARDIANCE) 10 MG TABS tablet Take 1 tablet (10 mg total) by mouth daily before breakfast. 04/13/21   Evans Lance, MD  erythromycin ophthalmic ointment Place a 1/2 inch ribbon of ointment into the lower eyelid. 09/11/20   Varney Biles, MD  escitalopram (LEXAPRO) 20 MG tablet Take 20 mg by mouth daily. 10/23/20   [provider]  Fluorouracil (TOLAK) 4 % CREA Apply to nost qhs Monday - Sunday x 2 weeks- avoid sunlight 07/08/20   Sheffield, Vida Roller R, PA-C  fluticasone (FLONASE) 50 MCG/ACT nasal spray Place 2 sprays into both nostrils daily.    [provider]  fluticasone furoate-vilanterol (BREO ELLIPTA) 200-25 MCG/INH AEPB Inhale 1 puff by mouth once daily 06/11/20   Mannam, Praveen, MD  furosemide (LASIX) 40 MG tablet TAKE 1 TO 2 TABLETS BY MOUTH AS DIRECTED ONCE DAILY FOR EDEMA ,  SHORTNESS  OF  BREATH  OR  WEIGHT  GAIN 05/27/21   Evans Lance, MD  gabapentin (NEURONTIN) 100 MG capsule Take 1 capsule (100 mg total) by mouth 3 (three) times daily as needed. 11/05/20   Melvenia Beam, MD  hydroxypropyl methylcellulose / hypromellose (ISOPTO TEARS / GONIOVISC) 2.5 % ophthalmic solution Place 1 drop into the right eye 4 (four) times daily as needed for dry eyes. 09/11/20   Varney Biles, MD  ipratropium-albuterol (DUONEB) 0.5-2.5 (3) MG/3ML SOLN Take 3 mLs by nebulization every 4 (four) hours as needed. 10/09/17   Lauraine Rinne, NP  Multiple Vitamins-Minerals (CENTRUM SILVER 50+WOMEN) TABS Take 1 tablet by  mouth daily with breakfast.     [provider]  pantoprazole (PROTONIX) 40 MG tablet Take 40 mg by mouth daily. 08/29/17   [provider]  rosuvastatin (CRESTOR) 5 MG tablet Take 5 mg by mouth daily. 11/18/20   [provider]  sacubitril-valsartan (ENTRESTO) 24-26 MG Take 1 tablet by mouth 2 (two) times daily. 04/09/21   Evans Lance, MD  spironolactone (ALDACTONE) 25 MG tablet Take 1 tablet by mouth once daily 05/27/21   Evans Lance, MD      Allergies    Ivp dye [iodinated contrast media], Penicillins, Sulfonamide derivatives, Tessalon [benzonatate], Sulfa antibiotics, Rosuvastatin calcium, and Onion    Review of Systems   Review of Systems  Physical Exam Updated Vital Signs BP 116/61    Pulse 71    Temp 98.2 F (36.8 C) (Oral)    Resp 17    SpO2 96%  Physical Exam  ED Results / Procedures / Treatments   Labs (all labs ordered are listed, but only abnormal results are displayed) Labs Reviewed  BASIC METABOLIC PANEL - Abnormal; Notable for the following components:      Result Value   Glucose, Bld 125 (*)    All other components within normal limits  CBC WITH DIFFERENTIAL/PLATELET - Abnormal; Notable for the following components:   Eosinophils Absolute 0.6 (*)    All other components within normal limits  CBG MONITORING, ED  TROPONIN I (HIGH SENSITIVITY)  TROPONIN I (HIGH SENSITIVITY)    EKG EKG Interpretation  Date/Time:  Wednesday June 16 2021 10:20:42 EST Ventricular Rate:  71 PR Interval:  140 QRS Duration: 150 QT Interval:  468 QTC Calculation: 509 R Axis:   -12 Text Interpretation: Sinus rhythm Left bundle branch block Confirmed by Madalyn Rob [phone]) on 06/16/2021 10:23:26 AM  Radiology DG Chest 2 View  Result Date: 06/16/2021 CLINICAL DATA:  Chest pain. EXAM: CHEST - 2 VIEW COMPARISON:  November 11, 2018 FINDINGS: Left chest AICD/pacemaker with leads overlying the right atrium and right ventricle. The heart size and  mediastinal contours are within normal limits. No focal consolidation. No pleural effusion. No pneumothorax. Prior multilevel thoracic vertebral augmentation. Cholecystectomy clips. IMPRESSION: No acute process in the chest. Electronically Signed   By: Dahlia Bailiff M.D.   On: 06/16/2021 11:01    Procedures Procedures    Medications Ordered in ED Medications  diphenhydrAMINE (BENADRYL) capsule 50 mg (has no administration in time range)    Or  diphenhydrAMINE (BENADRYL) injection 50 mg (has no administration in time range)  fentaNYL (SUBLIMAZE) injection 50 mcg (50 mcg Intravenous Given 06/16/21 1106)  hydrocortisone sodium succinate (SOLU-CORTEF) 100 MG injection 200 mg (200 mg Intravenous Given 06/16/21 1305)    ED Course/ Medical Decision Making/ A&P  Medical Decision Making Amount and/or Complexity of Data Reviewed Labs: ordered. Radiology: ordered.  Risk Prescription drug management.   76 year old lady presents to ER with concern that her ICD fired.  On exam she appears well in no distress.  She is endorsing ongoing backslash chest pain.  Device was interrogated, per its report patient did not have any arrhythmias since last interrogation on March 18, 2021.  Patient appears to be in sinus rhythm at present, vital signs are all grossly stable.  Troponin within normal limits, no ischemic findings on her EKG.  Doubt ACS.  Given her atraumatic persistent back pain, concern for possibility of acute aortic pathology.  Patient per epic has allergy to contrast but patient states she has done well with premedication treatment.  Per my review of chart, patient has received both Solu-Cortef and Benadryl previously and no complications were documented.  Will proceed with premedication.   History of heart failure, s/p ICD, nonischemic cardiomyopathy.  Additional history obtained from chart review, review of last cardiology note.SEELEY SOUTHGATE is a 76 y.o. female  referred by Dr. Lovena Le to pharmacy clinic for HF medication management. PMH is significant for h/o ICM, chronic systolic heart failure, syncope, and a WPW pattern on her ECG. Most recent LVEF 25-30% on 07/01/19.   While awaiting CTA chest abdomen pelvis, signed out to Dr. Armandina Gemma - he will f/u on results and reassess patient.          Final Clinical Impression(s) / ED Diagnoses Final diagnoses:  Acute midline back pain, unspecified back location  Chest pain, unspecified type    Rx / DC Orders ED Discharge Orders     None         Lucrezia Starch, MD 06/16/21 1535

## 2021-06-16 NOTE — ED Triage Notes (Signed)
Pt here from home with c/o  defib shocking her this am around 0145 in the morning , no complaints at present  ?

## 2021-06-16 NOTE — ED Notes (Signed)
Discharge instructions reviewed with patient. Patient verbalized understanding of instructions. Follow-up care and medications were reviewed. Patient ambulatory with steady gait. VSS upon discharge.  ?

## 2021-06-16 NOTE — ED Notes (Signed)
Patient transported to CT 

## 2021-06-16 NOTE — Discharge Instructions (Signed)
Your CT findings were reassuring: ?IMPRESSION:  ?There is no evidence of dissection in the thoracic and abdominal  ?aorta. Major branches of thoracic and abdominal aorta are patent.  ?There is no evidence of mediastinal or retroperitoneal hematoma.  ?   ?There are no focal pulmonary infiltrates.  ?   ?Nodularity in the liver surface suggests cirrhosis. There is no  ?evidence of intestinal obstruction or pneumoperitoneum. There is no  ?hydronephrosis. Scattered diverticula are seen in the colon without  ?signs of focal diverticulitis. Right renal cyst.  ?   ?Other findings as described in the body of the report.  ?   ? ?

## 2021-06-16 NOTE — Telephone Encounter (Signed)
Called patient to discuss her patient message sent. Patient reports early this morning she was shocked by her ICD. States she is currently fatigue. Also currently reports of mid/upper back pain that radiates into chest. States pain feels like indigestion. Patient instructed to seek Emergency Department treatment at this time. Call 911. Do not drive. Patient agreeable to plan and voiced understanding. Advised to call with any further questions or concerns. ?

## 2021-06-25 DIAGNOSIS — K7460 Unspecified cirrhosis of liver: Secondary | ICD-10-CM | POA: Diagnosis not present

## 2021-07-05 ENCOUNTER — Telehealth: Payer: Self-pay

## 2021-07-05 DIAGNOSIS — K9089 Other intestinal malabsorption: Secondary | ICD-10-CM | POA: Diagnosis not present

## 2021-07-05 DIAGNOSIS — Z8719 Personal history of other diseases of the digestive system: Secondary | ICD-10-CM | POA: Diagnosis not present

## 2021-07-05 DIAGNOSIS — Z8679 Personal history of other diseases of the circulatory system: Secondary | ICD-10-CM | POA: Diagnosis not present

## 2021-07-05 DIAGNOSIS — K7460 Unspecified cirrhosis of liver: Secondary | ICD-10-CM | POA: Diagnosis not present

## 2021-07-05 DIAGNOSIS — K219 Gastro-esophageal reflux disease without esophagitis: Secondary | ICD-10-CM | POA: Diagnosis not present

## 2021-07-05 NOTE — Telephone Encounter (Signed)
? ?  Pre-operative Risk Assessment  ?  ?Patient Name: Paula Booker  ?DOB: April 10, 1946 ?MRN: [id number]  ? ?  ? ?Request for Surgical Clearance   ? ?Procedure:   ENDOSCOPY ? ?Date of Surgery:  Clearance 09/07/21                              ?   ?Surgeon:  DR. Alessandra Bevels ?Surgeon's Group or Practice Name:  [facility] ?Phone number:  [phone] ?Fax number:  [phone] ?  ?Type of Clearance Requested:   ?- Pharmacy:  Hold Apixaban (Eliquis) HOLD 2 DAYS PRIOR TO PROCEDURE ?  ?Type of Anesthesia:  Not Indicated ?  ?Additional requests/questions:   ? ?Signed, ?Jacinta Shoe   ?07/05/2021, 3:34 PM  ? ?

## 2021-07-06 ENCOUNTER — Encounter: Payer: Self-pay | Admitting: Internal Medicine

## 2021-07-06 ENCOUNTER — Ambulatory Visit (INDEPENDENT_AMBULATORY_CARE_PROVIDER_SITE_OTHER): Payer: Medicare HMO

## 2021-07-06 ENCOUNTER — Other Ambulatory Visit: Payer: Self-pay | Admitting: Gastroenterology

## 2021-07-06 DIAGNOSIS — I428 Other cardiomyopathies: Secondary | ICD-10-CM | POA: Diagnosis not present

## 2021-07-06 NOTE — Telephone Encounter (Signed)
Spoke with patient who is agreeable to see Robbie Lis, PA-C on 3/31 at 8:50 am. Patient thanked me for the call and verbalized understanding  ?

## 2021-07-06 NOTE — Telephone Encounter (Addendum)
Preliminary anticoag recs now available from pharm for review at upcoming requested visit. As below, needs OV prior to finalizing clearance. Already routed to callback below. Msg also routed to device clinic in msg below. ?

## 2021-07-06 NOTE — Telephone Encounter (Signed)
Patient with diagnosis of atrial fibrillation on Eliquis for anticoagulation.   ? ?Procedure: endoscopy ?Date of procedure: 09/07/21 ? ? ?CHA2DS2-VASc Score = 4  ? This indicates a 4.8% annual risk of stroke. ?The patient's score is based upon: ?CHF History: 1 ?HTN History: 1 ?Diabetes History: 0 ?Stroke History: 0 ?Vascular Disease History: 0 ?Age Score: 1 ?Gender Score: 1 ? ?CrCl 60 ?Platelet count 250 ? ?Per office protocol, patient can hold Eliquis for 2 days prior to procedure.   ?Patient will not need bridging with Lovenox (enoxaparin) around procedure. ? ? ? ?

## 2021-07-06 NOTE — Progress Notes (Signed)
PERIOPERATIVE PRESCRIPTION FOR IMPLANTED CARDIAC DEVICE PROGRAMMING ?  ?Patient Information: ? ?Patient: Paula Booker  ?MRN: [id number]  ?Date of Birth: 11-21-1945  ? ? ?  ?Planned Procedure:  ENDOSCOPY  ?Surgeon:  DR. Alessandra Bevels ?Date of Procedure:  09/07/2021  ?  ?Device Information: ?  ?Clinic EP Physician:   Dr. Cristopher Peru ?Device Type:  Defibrillator ?Manufacturer and Phone #:  [employer]: [phone] ?Pacemaker Dependent?:  No ?Date of Last Device Check:  06/16/2021 (Remote)        Normal Device Function?:  Yes   ?  ?Electrophysiologist's Recommendations: ?  ?Have magnet available. ?Provide continuous ECG monitoring when magnet is used or reprogramming is to be performed.  ?Procedure may interfere with device function.  Magnet should be placed over device during procedure. ? ?Per Device Clinic Standing Orders, ?Simone Curia  ?07/06/2021 ?3:37 PM ?

## 2021-07-06 NOTE — Telephone Encounter (Addendum)
? ?  Name: BESAN KETCHEM  ?DOB: 1945/07/05  ?MRN: [id number] ? ?Primary Cardiologist: Larae Grooms, MD / Dr. Lovena Le ? ?Chart reviewed as part of pre-operative protocol coverage. Because of Avabella Wailes Trela's past medical history and time since last visit, she will require a follow-up visit in order to better assess preoperative cardiovascular risk. ? ?Per recent MyChart message 06/16/21, patient reported being shocked by her ICD and was having mid/upper back pain radiating into her chest. It was recommended she go to ER. She was seen in ED who reported no evidence for ICD firing on review. She needs post-ER follow-up. ? ? ?Pre-op covering staff: ?- Please schedule appointment and call patient to inform them. OK to be with gen cards.  ?- I will also route this msg to device clinic for them to make sure they get copy of the ICD interrogation from 06/16/21 to patient's EP team for review (ER note states "no evidence for ICD firing on review" but need to ensure they are referring to an interrogation performed) ?- Please contact requesting surgeon's office via preferred method (i.e, phone, fax) to inform them of need for appointment prior to surgery. ? ?This message will also be routed to pharmacy pool  for input on holding anticoagulant/antiplatelet agent as requested below so that this information is available to the clearing provider at time of patient's appointment.  ? ?Charlie Pitter, PA-C  ?07/06/2021, 11:16 AM  ? ?

## 2021-07-07 LAB — CUP PACEART REMOTE DEVICE CHECK
Battery Remaining Longevity: 10 mo
Battery Voltage: 2.86 V
Brady Statistic AP VP Percent: 0.01 %
Brady Statistic AP VS Percent: 1.31 %
Brady Statistic AS VP Percent: 0.03 %
Brady Statistic AS VS Percent: 98.66 %
Brady Statistic RA Percent Paced: 1.31 %
Brady Statistic RV Percent Paced: 0.04 %
Date Time Interrogation Session: 20230329092638
HighPow Impedance: 89 Ohm
Implantable Lead Implant Date: 20131212
Implantable Lead Implant Date: 20131212
Implantable Lead Location: 753859
Implantable Lead Location: 753860
Implantable Lead Model: 5076
Implantable Lead Model: 6935
Implantable Pulse Generator Implant Date: 20131212
Lead Channel Impedance Value: 513 Ohm
Lead Channel Impedance Value: 589 Ohm
Lead Channel Impedance Value: 779 Ohm
Lead Channel Pacing Threshold Amplitude: 0.375 V
Lead Channel Pacing Threshold Amplitude: 0.625 V
Lead Channel Pacing Threshold Pulse Width: 0.4 ms
Lead Channel Pacing Threshold Pulse Width: 0.4 ms
Lead Channel Sensing Intrinsic Amplitude: 3.5 mV
Lead Channel Sensing Intrinsic Amplitude: 3.5 mV
Lead Channel Sensing Intrinsic Amplitude: 7.25 mV
Lead Channel Sensing Intrinsic Amplitude: 7.25 mV
Lead Channel Setting Pacing Amplitude: 2 V
Lead Channel Setting Pacing Amplitude: 2.5 V
Lead Channel Setting Pacing Pulse Width: 0.4 ms
Lead Channel Setting Sensing Sensitivity: 0.3 mV

## 2021-07-09 ENCOUNTER — Encounter: Payer: Self-pay | Admitting: Physician Assistant

## 2021-07-09 ENCOUNTER — Ambulatory Visit (INDEPENDENT_AMBULATORY_CARE_PROVIDER_SITE_OTHER): Payer: Medicare HMO | Admitting: Physician Assistant

## 2021-07-09 VITALS — BP 120/72 | HR 80 | Ht 64.0 in | Wt 204.0 lb

## 2021-07-09 DIAGNOSIS — Z01810 Encounter for preprocedural cardiovascular examination: Secondary | ICD-10-CM

## 2021-07-09 DIAGNOSIS — I5023 Acute on chronic systolic (congestive) heart failure: Secondary | ICD-10-CM

## 2021-07-09 DIAGNOSIS — I5022 Chronic systolic (congestive) heart failure: Secondary | ICD-10-CM | POA: Diagnosis not present

## 2021-07-09 DIAGNOSIS — I480 Paroxysmal atrial fibrillation: Secondary | ICD-10-CM

## 2021-07-09 DIAGNOSIS — I10 Essential (primary) hypertension: Secondary | ICD-10-CM | POA: Diagnosis not present

## 2021-07-09 NOTE — Progress Notes (Signed)
?Cardiology Office Note:   ? ?Date:  07/09/2021  ? ?ID:  Paula Booker, DOB [birth year], MRN [id number] ? ?PCP:  [name], MD  ?[place] Cardiologist:  Larae Grooms, MD  ?[facility] Electrophysiologist:  Cristopher Peru, MD  ? ?Chief Complaint: surgical clearance for endoscopy  ? ?History of Present Illness:   ? ?Paula Booker is a 76 y.o. female with a hx of NICM, chronic systolic CHF, s/p ICD,  WPW, PAF, HTN, and obesity seen for follow up.  ? ?Hx of WPW pattern on her ECG. She underwent EP study which demonstrated no inducible arrhythmias. She underwent ICD insertion. ? ?Echo 2013: LVEF 20-25% ?Echo 09/2013: LVEF 25% ?Echo 10/2014: LVEF 25-30% ?Echo 06/2019: LVEF 25-30%, grade 1 DD ?Coronary CT 06/2020 with calcium score of 0.  ? ?Seen in ER 06/16/21 for mid/upper back pain radiating to her chest. No ICD shock on device check. CT angio negative for dissection but suggestive of liver cirrhosis.  ? ?Here today for surgical clearance for endoscopy given recent finding of liver cirrhosis on CT.  She denies chest pain, shortness of breath, orthopnea, PND, syncope, lower extremity edema or melena.  She has chronic fatigue but no change.  Compliant with medication. ? ? ?Past Medical History:  ?Diagnosis Date  ? AICD (automatic cardioverter/defibrillator) present   ? Anemia   ? as a child  ? Anxiety   ? Arthritis   ? "hands and knees" (03/22/2012)  ? Asthma   ? Basal cell carcinoma 04/27/2011  ? mid forehead(MOHS)  ? Bursitis   ? CHF (congestive heart failure) ([place]) 09/2011  ? pt. denies  ? Depression   ? Dysrhythmia   ? WPW  ? GERD (gastroesophageal reflux disease)   ? Head injury, acute, with loss of consciousness ([place])   ? History of hiatal hernia   ? Hypertension   ? ICD (implantable cardiac defibrillator) in place 03/2012  ? Migraines   ? migraines   ? NICM (nonischemic cardiomyopathy) ([place])   ? Obesity (BMI 30-39.9)   ? negative sleep study 2014  ? OSA (obstructive sleep apnea) 05/27/2018  ? Mild  with AHI 6/hr    no cpap  ? Pneumonia   ? hx  ? Shortness of breath   ? "@ any time I can get SOB" (03/22/2012)  ? Skin cancer 1999  ? Squamous cell carcinoma of skin 07/08/2020  ? in situ right malar cheek-[id number]  ? Sudden arrhythmia death syndrome   ? WPW (Wolff-Parkinson-White syndrome)   ? ? ?Past Surgical History:  ?Procedure Laterality Date  ? CARDIAC CATHETERIZATION  09/18/11  ? no significant CAD  ? CARDIAC DEFIBRILLATOR PLACEMENT  03/22/2012  ? DDD  ? CHOLECYSTECTOMY  ~ 2003  ? IMPLANTABLE CARDIOVERTER DEFIBRILLATOR IMPLANT N/A 03/22/2012  ? Procedure: IMPLANTABLE CARDIOVERTER DEFIBRILLATOR IMPLANT;  Surgeon: Evans Lance, MD;  Location[facility] C[facility] LAB;  Service: Cardiovascular;  Laterality: N/A;  ? KNEE ARTHROSCOPY Left 11/29/2017  ? Procedure: ARTHROSCOPY LEFT KNEE;  Surgeon: Dorna Leitz, MD;  Location: [facility] ORS;  Service: Orthopedics;  Laterality: Left;  ? KNEE ARTHROSCOPY Right 08/09/2019  ? Procedure: ARTHROSCOPY RIGHT KNEE, CHONDROPLASTY,  PARTIAL MEDIAL  MENSICECTOMY;  Surgeon: Dorna Leitz, MD;  Location: [facility] ORS;  Service: Orthopedics;  Laterality: Right;  ? KYPHOPLASTY N/A 11/27/2013  ? Procedure: KYPHOPLASTY;  Surgeon: Sinclair Ship, MD;  Location: [place];  Service: Orthopedics;  Laterality: N/A;  T9, T11 kyphoplasty  ? KYPHOPLASTY N/A 06/05/2014  ? Procedure: KYPHOPLASTY;  Surgeon:  Sinclair Ship, MD;  Location: [place];  Service: Orthopedics;  Laterality: N/A;  T7 kyphoplasty  ? MOHS SURGERY  06/2011  ? "forehead" (03/22/2012)  ? SKIN CANCER EXCISION  1999  ? "top of my head and my right back shoulder"  ? SKIN CANCER EXCISION    ? back  ? SKIN CANCER EXCISION    ? face  ? SUPRAVENTRICULAR TACHYCARDIA ABLATION  03/22/2012  ? unable to induce VT/RN report 03/22/2012  ? SUPRAVENTRICULAR TACHYCARDIA ABLATION N/A 03/22/2012  ? Procedure: SUPRAVENTRICULAR TACHYCARDIA ABLATION;  Surgeon: Evans Lance, MD;  Location: [facility] CATH LAB;  Service: Cardiovascular;  Laterality: N/A;  ? [place]  ? TUBAL LIGATION  1978  ? ? ?Current Medications: ?Current Meds  ?Medication Sig  ? acetaminophen (TYLENOL) 500 MG tablet Take 500 mg by mouth in the morning and at bedtime.  ? albuterol (VENTOLIN HFA) 108 (90 Base) MCG/ACT inhaler Inhale 2 puffs into the lungs every 6 (six) hours as needed for wheezing. For wheezing. ?Use 2 puffs 3 times daily x5 days and then every 6 hours as needed.  ? alendronate (FOSAMAX) 70 MG tablet Take 70 mg by mouth every Sunday.   ? apixaban (ELIQUIS) 5 MG TABS tablet Take 1 tablet by mouth twice daily  ? Calcium Carbonate (CALCIUM 500 PO) Take by mouth. One tablet daily.  ? carvedilol (COREG) 6.25 MG tablet Take 1 tablet (6.25 mg total) by mouth 2 (two) times daily.  ? cetirizine (ZYRTEC) 10 MG tablet Take 10 mg by mouth daily.  ? Cholecalciferol (VITAMIN D3) 5000 units CAPS Take 5,000 Units by mouth daily.  ? Cyanocobalamin (VITAMIN B-12) 5000 MCG LOZG Take 5,000 mcg by mouth daily.  ? cyclobenzaprine (FLEXERIL) 10 MG tablet Take 1 tablet (10 mg total) by mouth 2 (two) times daily as needed for muscle spasms.  ? empagliflozin (JARDIANCE) 10 MG TABS tablet Take 1 tablet (10 mg total) by mouth daily before breakfast.  ? erythromycin ophthalmic ointment Place a 1/2 inch ribbon of ointment into the lower eyelid.  ? escitalopram (LEXAPRO) 20 MG tablet Take 20 mg by mouth daily.  ? Fluorouracil (TOLAK) 4 % CREA Apply to nost qhs Monday - Sunday x 2 weeks- avoid sunlight  ? fluticasone (FLONASE) 50 MCG/ACT nasal spray Place 2 sprays into both nostrils daily.  ? fluticasone furoate-vilanterol (BREO ELLIPTA) 200-25 MCG/INH AEPB Inhale 1 puff by mouth once daily  ? furosemide (LASIX) 40 MG tablet TAKE 1 TO 2 TABLETS BY MOUTH AS DIRECTED ONCE DAILY FOR EDEMA ,  SHORTNESS  OF  BREATH  OR  WEIGHT  GAIN  ? gabapentin (NEURONTIN) 100 MG capsule Take 1 capsule (100 mg total) by mouth 3 (three) times daily as needed.  ? hydroxypropyl methylcellulose / hypromellose (ISOPTO TEARS /  GONIOVISC) 2.5 % ophthalmic solution Place 1 drop into the right eye 4 (four) times daily as needed for dry eyes.  ? ipratropium-albuterol (DUONEB) 0.5-2.5 (3) MG/3ML SOLN Take 3 mLs by nebulization every 4 (four) hours as needed.  ? lidocaine (LIDODERM) 5 % Place 1 patch onto the skin daily. Remove & Discard patch within 12 hours or as directed by MD  ? Multiple Vitamins-Minerals (CENTRUM SILVER 50+WOMEN) TABS Take 1 tablet by mouth daily with breakfast.   ? pantoprazole (PROTONIX) 40 MG tablet Take 40 mg by mouth daily.  ? rosuvastatin (CRESTOR) 5 MG tablet Take 5 mg by mouth daily.  ? sacubitril-valsartan (ENTRESTO) 24-26 MG Take 1 tablet by  mouth 2 (two) times daily.  ? spironolactone (ALDACTONE) 25 MG tablet Take 1 tablet by mouth once daily  ?  ? ?Allergies:   Ivp dye [iodinated contrast media], Penicillins, Sulfonamide derivatives, Tessalon [benzonatate], Sulfa antibiotics, Rosuvastatin calcium, and Onion  ? ?Social History  ? ?Socioeconomic History  ? Marital status: Divorced  ?  Spouse name: Not on file  ? Number of children: 3  ? Years of education: Not on file  ? Highest education level: Not on file  ?Occupational History  ? Occupation: retired  ?  Comment: accountant  ?Tobacco Use  ? Smoking status: Never  ? Smokeless tobacco: Never  ?Vaping Use  ? Vaping Use: Never used  ?Substance and Sexual Activity  ? Alcohol use: Not Currently  ?  Comment: rare  ? Drug use: No  ? Sexual activity: Not Currently  ?Other Topics Concern  ? Not on file  ?Social History Narrative  ? Divorced, 3 children, 9 grandchildren. Did work for [employer]. Retired at 76 y/o .  ? ?Social Determinants of Health  ? ?Financial Resource Strain: Not on file  ?Food Insecurity: Not on file  ?Transportation Needs: Not on file  ?Physical Activity: Not on file  ?Stress: Not on file  ?Social Connections: Not on file  ?  ? ?Family History: ?The patient's family history includes Atrial fibrillation in her brother and mother; Congestive Heart  Failure in her brother, father, and mother; Deafness in her father and mother; Healthy in her sister, sister, and sister; Kidney Stones in her mother; Tuberculosis in her paternal grandfather. There is no his

## 2021-07-09 NOTE — Patient Instructions (Signed)
Medication Instructions:  ?Your physician recommends that you continue on your current medications as directed. Please refer to the Current Medication list given to you today. ? ?*If you need a refill on your cardiac medications before your next appointment, please call your pharmacy* ? ?Lab Work: ?NONE ?If you have labs (blood work) drawn today and your tests are completely normal, you will receive your results only by: ?MyChart Message (if you have MyChart) OR ?A paper copy in the mail ?If you have any lab test that is abnormal or we need to change your treatment, we will call you to review the results. ? ?Testing/Procedures: ?NONE ? ?Follow-Up: ?At [facility], you and your health needs are our priority.  As part of our continuing mission to provide you with exceptional heart care, we have created designated Provider Care Teams.  These Care Teams include your primary Cardiologist (physician) and Advanced Practice Providers (APPs -  Physician Assistants and Nurse Practitioners) who all work together to provide you with the care you need, when you need it. ? ?Your next appointment:   ?1 year(s) ? ?The format for your next appointment:   ?In Person ? ?Provider:   ?Larae Grooms, MD ?

## 2021-07-15 ENCOUNTER — Telehealth: Payer: Self-pay

## 2021-07-15 NOTE — Telephone Encounter (Signed)
I spoke with the patient and she agreed to send missed ICM transmission today. ?

## 2021-07-16 NOTE — Progress Notes (Signed)
No ICM remote transmission received for 07/12/2021 and next ICM transmission scheduled for 08/02/2021.   ?

## 2021-07-19 NOTE — Progress Notes (Signed)
Remote ICD transmission.   

## 2021-07-20 ENCOUNTER — Ambulatory Visit: Payer: Medicare HMO | Admitting: Physician Assistant

## 2021-07-30 ENCOUNTER — Ambulatory Visit (INDEPENDENT_AMBULATORY_CARE_PROVIDER_SITE_OTHER): Payer: Medicare HMO

## 2021-07-30 DIAGNOSIS — I5022 Chronic systolic (congestive) heart failure: Secondary | ICD-10-CM

## 2021-07-30 DIAGNOSIS — Z95810 Presence of automatic (implantable) cardiac defibrillator: Secondary | ICD-10-CM

## 2021-07-30 NOTE — Progress Notes (Signed)
EPIC Encounter for ICM Monitoring ? ?Patient Name: Paula Booker is a 76 y.o. female ?Date: 07/30/2021 ?Primary Care Physican: [name], MD ?Primary Cardiologist: Irish Lack ?Electrophysiologist: Lovena Le ?07/09/2021 Office Weight: 204 lbs ?  ?Time in AT/AF  0.0 hr/day (0.0%) ?  ?      Spoke with patient and heart failure questions reviewed.  Pt report swelling of feet when sitting outside in heat.  She takes Furosemide 40 mg daily but will take 2nd tablet when she has swelling.  She is doing well at this time.  ?  ?Optivol thoracic impedance suggesting normal fluid levels. ?  ?Prescribed:  ?Furosemide 40 mg take 1-2 tablets (40 mg - 80 mg total) by mouth as directed once daily for edema, SOB or weight gain. ?Spironolactone 25 mg take 1 tablet by mouth daily ?  ?Labs: ?06/16/2021 Creatinine 0.90, BUN 16, Potassium 4.2, Sodium 138, GFR >60 ?05/13/2021 Creatinine 0.95, BUN 9,   Potassium 4.3, Sodium 140, GFR 62 ?04/09/2021 Creatinine 0.96, BUN 12, Potassium 4.4, Sodium 138, GFR 62 ?10/29/2020 Creatinine 0.88, BUN 12, Potassium 3.7, Sodium 139, GFR >60 ?05/30/2020 Creatinine 0.97, BUN 22, Potassium 4.0, Sodium 136, GFR 61 Care Everywhere ?A complete set of results can be found in Results Review. ?  ?Recommendations:  No changes and encouraged to call if experiencing any fluid symptoms. ?  ?Follow-up plan: ICM clinic phone appointment on 08/30/2021.   91 day device clinic remote transmission 10/05/2021.   ?  ?EP/Cardiology Office Visits:    08/19/2021 with Dr Irish Lack.   ?  ?Copy of ICM check sent to Dr. Lovena Le.  ? ?3 month ICM trend: 07/30/2021. ? ? ? ?12-14 Month ICM trend:  ? ? ? ?[name], RN ?07/30/2021 ?10:52 AM ? ?

## 2021-08-12 ENCOUNTER — Encounter: Payer: Self-pay | Admitting: Physician Assistant

## 2021-08-12 ENCOUNTER — Ambulatory Visit (INDEPENDENT_AMBULATORY_CARE_PROVIDER_SITE_OTHER): Payer: Medicare HMO | Admitting: Physician Assistant

## 2021-08-12 DIAGNOSIS — L089 Local infection of the skin and subcutaneous tissue, unspecified: Secondary | ICD-10-CM | POA: Diagnosis not present

## 2021-08-12 DIAGNOSIS — L304 Erythema intertrigo: Secondary | ICD-10-CM

## 2021-08-12 MED ORDER — KETOCONAZOLE 2 % EX CREA: 1.0000 "application " | TOPICAL_CREAM | Freq: Two times a day (BID) | CUTANEOUS | 3 refills | Status: AC

## 2021-08-12 MED ORDER — ALCLOMETASONE DIPROPIONATE 0.05 % EX CREA: TOPICAL_CREAM | Freq: Two times a day (BID) | CUTANEOUS | 3 refills | Status: AC | PRN

## 2021-08-12 MED ORDER — MUPIROCIN 2 % EX OINT: 1.0000 "application " | TOPICAL_OINTMENT | Freq: Two times a day (BID) | CUTANEOUS | 0 refills | Status: AC

## 2021-08-12 NOTE — Patient Instructions (Addendum)
Keep it dry. Can get Zabsorb - AF you can get OTC.  ?Tripple paste - Zinc in the diaper rash section. ?

## 2021-08-16 ENCOUNTER — Telehealth: Payer: Self-pay | Admitting: *Deleted

## 2021-08-16 NOTE — Telephone Encounter (Signed)
Did prior authorization for patients alclometasone and mupirocin.  ?

## 2021-08-18 ENCOUNTER — Encounter: Payer: Self-pay | Admitting: Physician Assistant

## 2021-08-18 ENCOUNTER — Telehealth: Payer: Self-pay | Admitting: *Deleted

## 2021-08-18 LAB — ANAEROBIC AND AEROBIC CULTURE
MICRO NUMBER:: 13351873
MICRO NUMBER:: 13351874
SPECIMEN QUALITY:: ADEQUATE
SPECIMEN QUALITY:: ADEQUATE

## 2021-08-18 NOTE — Progress Notes (Signed)
?  ?Cardiology Office Note ? ? ?Date:  08/19/2021  ? ?ID:  Paula Booker, DOB 02/03/1946, MRN [id number] ? ?PCP:  [name], MD  ? ? ?No chief complaint on file. ? ?NICM ? ?Wt Readings from Last 3 Encounters:  ?08/19/21 201 lb (91.2 kg)  ?07/09/21 204 lb (92.5 kg)  ?03/18/21 204 lb (92.5 kg)  ?  ? ?  ?History of Present Illness: ?Paula Booker is a 76 y.o. female    who has a nonischemic cardiomyopathy. She has had WPW diagnosed on ECG as well by Dr. Lovena Le.  She has had ICD implant and EP study for WPW with no inducible arrhythmia. ?  ?Hospitalized in Dec 2016 for sepsis, UTI, pneumonia.  SHe was in the hospital for about a week.   ?  ?She has routine EP f/u on AICD.  She typically takes her Lasix regularly.  If she skips it, she gets a call about fluid retention. ?  ?In the past, she had an asthma attack.  She was treated with steroids and nebulizer. ?  ?Sleep study ordered in 03/2018 for daytime somnolence.  ?  ?She does try to avoid processed food.  ?  ?She banged her head on a shelf while standing up and lost consciousness, on 04/04/2019.  She called her PMD.  Eventually had a head CT- which showed no bleed in her head.  SHe did have a concussion.  ?  ?She had an abscessed tooth that was treated with ABx in 2020.   She has had this problem before but stated she has not taken antibiotics before teeth pulling in the past and has done well. Pulled her tooth. ?  ?Diagnosed with AFib in 5/21. Started on Eliquis 5 BID by Dr. Lovena Le for stroke prevention.  ?  ?She has cortisone injections. ? ?She had an abnormal liver by CT.  Endoscopy later this month. ? ?Denies : Chest pain. Dizziness. Nitroglycerin use. Orthopnea. Palpitations. Paroxysmal nocturnal dyspnea. Shortness of breath. Syncope.   ? ? ? ?Past Medical History:  ?Diagnosis Date  ? AICD (automatic cardioverter/defibrillator) present   ? Anemia   ? as a child  ? Anxiety   ? Arthritis   ? "hands and knees" (03/22/2012)  ? Asthma   ? Basal cell  carcinoma 04/27/2011  ? mid forehead(MOHS)  ? Bursitis   ? CHF (congestive heart failure) ([place]) 09/2011  ? pt. denies  ? Depression   ? Dysrhythmia   ? WPW  ? GERD (gastroesophageal reflux disease)   ? Head injury, acute, with loss of consciousness ([place])   ? History of hiatal hernia   ? Hypertension   ? ICD (implantable cardiac defibrillator) in place 03/2012  ? Migraines   ? migraines   ? NICM (nonischemic cardiomyopathy) ([place])   ? Obesity (BMI 30-39.9)   ? negative sleep study 2014  ? OSA (obstructive sleep apnea) 05/27/2018  ? Mild with AHI 6/hr    no cpap  ? Pneumonia   ? hx  ? Shortness of breath   ? "@ any time I can get SOB" (03/22/2012)  ? Skin cancer 1999  ? Squamous cell carcinoma of skin 07/08/2020  ? in situ right malar cheek-[id number]  ? Sudden arrhythmia death syndrome   ? WPW (Wolff-Parkinson-White syndrome)   ? ? ?Past Surgical History:  ?Procedure Laterality Date  ? CARDIAC CATHETERIZATION  09/18/11  ? no significant CAD  ? CARDIAC DEFIBRILLATOR PLACEMENT  03/22/2012  ? DDD  ? CHOLECYSTECTOMY  ~  2003  ? IMPLANTABLE CARDIOVERTER DEFIBRILLATOR IMPLANT N/A 03/22/2012  ? Procedure: IMPLANTABLE CARDIOVERTER DEFIBRILLATOR IMPLANT;  Surgeon: Evans Lance, MD;  Location: [facility] CATH LAB;  Service: Cardiovascular;  Laterality: N/A;  ? KNEE ARTHROSCOPY Left 11/29/2017  ? Procedure: ARTHROSCOPY LEFT KNEE;  Surgeon: Dorna Leitz, MD;  Location: [facility];  Service: Orthopedics;  Laterality: Left;  ? KNEE ARTHROSCOPY Right 08/09/2019  ? Procedure: ARTHROSCOPY RIGHT KNEE, CHONDROPLASTY,  PARTIAL MEDIAL  MENSICECTOMY;  Surgeon: Dorna Leitz, MD;  Location: [facility];  Service: Orthopedics;  Laterality: Right;  ? KYPHOPLASTY N/A 11/27/2013  ? Procedure: KYPHOPLASTY;  Surgeon: Sinclair Ship, MD;  Location: [place];  Service: Orthopedics;  Laterality: N/A;  T9, T11 kyphoplasty  ? KYPHOPLASTY N/A 06/05/2014  ? Procedure: KYPHOPLASTY;  Surgeon: Sinclair Ship, MD;  Location: [place];  Service: Orthopedics;  Laterality: N/A;   T7 kyphoplasty  ? MOHS SURGERY  06/2011  ? "forehead" (03/22/2012)  ? SKIN CANCER EXCISION  1999  ? "top of my head and my right back shoulder"  ? SKIN CANCER EXCISION    ? back  ? SKIN CANCER EXCISION    ? face  ? SUPRAVENTRICULAR TACHYCARDIA ABLATION  03/22/2012  ? unable to induce VT/RN report 03/22/2012  ? SUPRAVENTRICULAR TACHYCARDIA ABLATION N/A 03/22/2012  ? Procedure: SUPRAVENTRICULAR TACHYCARDIA ABLATION;  Surgeon: Evans Lance, MD;  Location[facility] CATH LAB;  Service: Cardiovascular;  Laterality: N/A;  ? [place]  ? TUBAL LIGATION  1978  ? ? ? ?Current Outpatient Medications  ?Medication Sig Dispense Refill  ? acetaminophen (TYLENOL) 500 MG tablet Take 500 mg by mouth in the morning and at bedtime.    ? albuterol (VENTOLIN HFA) 108 (90 Base) MCG/ACT inhaler Inhale 2 puffs into the lungs every 6 (six) hours as needed for wheezing. For wheezing. ?Use 2 puffs 3 times daily x5 days and then every 6 hours as needed. 18 g 5  ? alclomethasone (ACLOVATE) 0.05 % cream Apply topically 2 (two) times daily as needed (Rash). QHS 180 g 3  ? alendronate (FOSAMAX) 70 MG tablet Take 70 mg by mouth every Sunday.     ? apixaban (ELIQUIS) 5 MG TABS tablet Take 1 tablet by mouth twice daily 180 tablet 1  ? Calcium Carbonate (CALCIUM 500 PO) Take by mouth. One tablet daily.    ? carvedilol (COREG) 6.25 MG tablet Take 1 tablet (6.25 mg total) by mouth 2 (two) times daily. 180 tablet 3  ? cetirizine (ZYRTEC) 10 MG tablet Take 10 mg by mouth daily.    ? Cholecalciferol (VITAMIN D3) 5000 units CAPS Take 5,000 Units by mouth daily.    ? Cyanocobalamin (VITAMIN B-12) 5000 MCG LOZG Take 5,000 mcg by mouth daily.    ? cyclobenzaprine (FLEXERIL) 10 MG tablet Take 1 tablet (10 mg total) by mouth 2 (two) times daily as needed for muscle spasms. 20 tablet 0  ? empagliflozin (JARDIANCE) 10 MG TABS tablet Take 1 tablet (10 mg total) by mouth daily before breakfast. 90 tablet 3  ? erythromycin ophthalmic ointment  Place a 1/2 inch ribbon of ointment into the lower eyelid. 3.5 g 0  ? escitalopram (LEXAPRO) 20 MG tablet Take 20 mg by mouth daily.    ? Fluorouracil (TOLAK) 4 % CREA Apply to nost qhs Monday - Sunday x 2 weeks- avoid sunlight 40 g 1  ? fluticasone (FLONASE) 50 MCG/ACT nasal spray Place 2 sprays into both nostrils daily.    ? fluticasone furoate-vilanterol (BREO  ELLIPTA) 200-25 MCG/INH AEPB Inhale 1 puff by mouth once daily 60 each 5  ? furosemide (LASIX) 40 MG tablet TAKE 1 TO 2 TABLETS BY MOUTH AS DIRECTED ONCE DAILY FOR EDEMA ,  SHORTNESS  OF  BREATH  OR  WEIGHT  GAIN 180 tablet 1  ? gabapentin (NEURONTIN) 100 MG capsule Take 1 capsule (100 mg total) by mouth 3 (three) times daily as needed. 90 capsule 1  ? hydroxypropyl methylcellulose / hypromellose (ISOPTO TEARS / GONIOVISC) 2.5 % ophthalmic solution Place 1 drop into the right eye 4 (four) times daily as needed for dry eyes. 15 mL 0  ? ipratropium-albuterol (DUONEB) 0.5-2.5 (3) MG/3ML SOLN Take 3 mLs by nebulization every 4 (four) hours as needed. 360 mL 1  ? ketoconazole (NIZORAL) 2 % cream Apply 1 application. topically 2 (two) times daily. daily 60 g 3  ? lidocaine (LIDODERM) 5 % Place 1 patch onto the skin daily. Remove & Discard patch within 12 hours or as directed by MD 30 patch 0  ? Multiple Vitamins-Minerals (CENTRUM SILVER 50+WOMEN) TABS Take 1 tablet by mouth daily with breakfast.     ? mupirocin ointment (BACTROBAN) 2 % Apply 1 application. topically 2 (two) times daily. QAM-morning 60 g 0  ? pantoprazole (PROTONIX) 40 MG tablet Take 40 mg by mouth daily.  0  ? rosuvastatin (CRESTOR) 5 MG tablet Take 5 mg by mouth daily.    ? sacubitril-valsartan (ENTRESTO) 24-26 MG Take 1 tablet by mouth 2 (two) times daily. 180 tablet 3  ? spironolactone (ALDACTONE) 25 MG tablet Take 1 tablet (25 mg total) by mouth daily. 90 tablet 3  ? ?No current facility-administered medications for this visit.  ? ? ?Allergies:   Ivp dye [iodinated contrast media],  Penicillins, Sulfonamide derivatives, Tessalon [benzonatate], Sulfa antibiotics, Rosuvastatin calcium, and Onion  ? ? ?Social History:  The patient  reports that she has never smoked. She has never used smokeless tobacc

## 2021-08-18 NOTE — Progress Notes (Signed)
? ?  Follow-Up Visit ?  ?Subjective  ?Paula Booker is a 76 y.o. female who presents for the following: Rash (Rash/ raw under breast x  2 weeks- very sore tx- neosporin, corn starch & hydrocortisone cream). ? ? ?The following portions of the chart were reviewed this encounter and updated as appropriate:  Tobacco  Allergies  Meds  Problems  Med Hx  Surg Hx  Fam Hx   ?  ? ?Objective  ?Well appearing patient in no apparent distress; mood and affect are within normal limits. ? ?All skin waist up examined. ? ?Left Inframammary Fold, Right Inframammary Fold ?Erythema with slight scale ? ? ?Assessment & Plan  ?Infection of skin and subcutaneous tissue ? ?Related Procedures ?Anaerobic and Aerobic Culture ? ?Erythema intertrigo ?Left Inframammary Fold; Right Inframammary Fold ? ?Anaerobic and Aerobic Culture - Left Inframammary Fold, Right Inframammary Fold ? ?ketoconazole (NIZORAL) 2 % cream - Left Inframammary Fold, Right Inframammary Fold ?Apply 1 application. topically 2 (two) times daily. daily ? ?alclomethasone (ACLOVATE) 0.05 % cream - Left Inframammary Fold, Right Inframammary Fold ?Apply topically 2 (two) times daily as needed (Rash). QHS ? ?mupirocin ointment (BACTROBAN) 2 % - Left Inframammary Fold, Right Inframammary Fold ?Apply 1 application. topically 2 (two) times daily. QAM-morning ? ? ? ?I, Tuere Nwosu, PA-C, have reviewed all documentation's for this visit.  The documentation on 08/18/21 for the exam, diagnosis, procedures and orders are all accurate and complete. ?

## 2021-08-18 NOTE — Telephone Encounter (Signed)
Left message for patient to return our phone call for results.  ?

## 2021-08-18 NOTE — Telephone Encounter (Signed)
-----   Message from Warren Danes, [place] sent at 08/18/2021 11:27 AM EDT ----- ?No infection. Check status.  ?

## 2021-08-19 ENCOUNTER — Encounter: Payer: Self-pay | Admitting: Interventional Cardiology

## 2021-08-19 ENCOUNTER — Telehealth: Payer: Self-pay | Admitting: *Deleted

## 2021-08-19 ENCOUNTER — Ambulatory Visit (INDEPENDENT_AMBULATORY_CARE_PROVIDER_SITE_OTHER): Payer: Medicare HMO | Admitting: Interventional Cardiology

## 2021-08-19 VITALS — BP 100/64 | HR 78 | Ht 64.0 in | Wt 201.0 lb

## 2021-08-19 DIAGNOSIS — Z95810 Presence of automatic (implantable) cardiac defibrillator: Secondary | ICD-10-CM

## 2021-08-19 DIAGNOSIS — Z7901 Long term (current) use of anticoagulants: Secondary | ICD-10-CM

## 2021-08-19 DIAGNOSIS — I5022 Chronic systolic (congestive) heart failure: Secondary | ICD-10-CM | POA: Diagnosis not present

## 2021-08-19 DIAGNOSIS — E669 Obesity, unspecified: Secondary | ICD-10-CM | POA: Diagnosis not present

## 2021-08-19 DIAGNOSIS — I10 Essential (primary) hypertension: Secondary | ICD-10-CM | POA: Diagnosis not present

## 2021-08-19 DIAGNOSIS — I480 Paroxysmal atrial fibrillation: Secondary | ICD-10-CM

## 2021-08-19 MED ORDER — SPIRONOLACTONE 25 MG PO TABS: 25.0000 mg | ORAL_TABLET | Freq: Every day | ORAL | 3 refills | Status: DC

## 2021-08-19 NOTE — Telephone Encounter (Signed)
Prior authorization for Mupirocin is good until 04/10/22 & prior authorization for alclometasone 0.05 cream is good until 04/10/22. ?

## 2021-08-19 NOTE — Patient Instructions (Signed)

## 2021-08-23 ENCOUNTER — Telehealth: Payer: Self-pay

## 2021-08-23 NOTE — Telephone Encounter (Signed)
Phone call to patient with her culture results. Patient aware of culture results, patient states that she's doing well.  ?

## 2021-08-23 NOTE — Telephone Encounter (Signed)
-----   Message from Warren Danes, [place] sent at 08/18/2021 11:27 AM EDT ----- ?No infection. Check status.  ?

## 2021-08-27 ENCOUNTER — Encounter (HOSPITAL_COMMUNITY): Payer: Self-pay | Admitting: Gastroenterology

## 2021-08-27 NOTE — Progress Notes (Signed)
Attempted to obtain medical history via telephone, unable to reach at this time. HIPAA compliant voicemail message left requesting return call to pre surgical testing department. 

## 2021-08-30 ENCOUNTER — Ambulatory Visit (INDEPENDENT_AMBULATORY_CARE_PROVIDER_SITE_OTHER): Payer: Medicare HMO

## 2021-08-30 DIAGNOSIS — I5022 Chronic systolic (congestive) heart failure: Secondary | ICD-10-CM | POA: Diagnosis not present

## 2021-08-30 DIAGNOSIS — Z95810 Presence of automatic (implantable) cardiac defibrillator: Secondary | ICD-10-CM | POA: Diagnosis not present

## 2021-09-02 NOTE — Progress Notes (Signed)
EPIC Encounter for ICM Monitoring  Patient Name: Paula Booker is a 76 y.o. female Date: 09/02/2021 Primary Care Physican: [name], MD Primary Cardiologist: Irish Lack Electrophysiologist: Lovena Le 08/19/2021 Office Weight: 201 lbs   Time in AT/AF  0.0 hr/day (0.0%)         Spoke with patient and heart failure questions reviewed.  Pt asymptomatic for fluid accumulation.  She is drinking too much water and will limit to 64 oz.  Endoscopy scheduled 5/30.   Optivol thoracic impedance suggesting possible fluid accumulation from 5/3-5/15 and trending just below baseline normal 5/22.   Prescribed:  Furosemide 40 mg take 1-2 tablets (40 mg - 80 mg total) by mouth as directed once daily for edema, SOB or weight gain. Spironolactone 25 mg take 1 tablet by mouth daily   Labs: 06/16/2021 Creatinine 0.90, BUN 16, Potassium 4.2, Sodium 138, GFR >60 05/13/2021 Creatinine 0.95, BUN 9,   Potassium 4.3, Sodium 140, GFR 62 04/09/2021 Creatinine 0.96, BUN 12, Potassium 4.4, Sodium 138, GFR 62 10/29/2020 Creatinine 0.88, BUN 12, Potassium 3.7, Sodium 139, GFR >60 05/30/2020 Creatinine 0.97, BUN 22, Potassium 4.0, Sodium 136, GFR 61 Care Everywhere A complete set of results can be found in Results Review.   Recommendations:  Recommendation to limit fluid intake to 64 oz daily.  Encouraged to call if experiencing any fluid symptoms.    Follow-up plan: ICM clinic phone appointment on 10/06/2021.   91 day device clinic remote transmission 10/05/2021.     EP/Cardiology Office Visits:   Recall 03/13/2022 with Dr Lovena Le.  Recall 08/14/2022 with Dr Irish Lack.     Copy of ICM check sent to Dr. Lovena Le.   3 month ICM trend: 08/30/2021.    12-14 Month ICM trend:     [name], RN 09/02/2021 9:39 AM

## 2021-09-07 ENCOUNTER — Ambulatory Visit (HOSPITAL_BASED_OUTPATIENT_CLINIC_OR_DEPARTMENT_OTHER): Payer: Medicare HMO | Admitting: Anesthesiology

## 2021-09-07 ENCOUNTER — Encounter (HOSPITAL_COMMUNITY)
Admission: RE | Disposition: A | Discharge status: Home / Self Care | Payer: Self-pay | Source: Home / Self Care | Attending: Gastroenterology

## 2021-09-07 ENCOUNTER — Ambulatory Visit (HOSPITAL_COMMUNITY): Payer: Medicare HMO | Admitting: Anesthesiology

## 2021-09-07 ENCOUNTER — Encounter (HOSPITAL_COMMUNITY): Payer: Self-pay | Admitting: Gastroenterology

## 2021-09-07 ENCOUNTER — Other Ambulatory Visit: Payer: Self-pay

## 2021-09-07 ENCOUNTER — Ambulatory Visit (HOSPITAL_COMMUNITY)
Admission: RE | Admit: 2021-09-07 | Discharge: 2021-09-07 | Disposition: A | Discharge status: Home / Self Care | Payer: Medicare HMO | Attending: Gastroenterology | Admitting: Gastroenterology

## 2021-09-07 DIAGNOSIS — Z950 Presence of cardiac pacemaker: Secondary | ICD-10-CM | POA: Insufficient documentation

## 2021-09-07 DIAGNOSIS — I456 Pre-excitation syndrome: Secondary | ICD-10-CM | POA: Insufficient documentation

## 2021-09-07 DIAGNOSIS — K2970 Gastritis, unspecified, without bleeding: Secondary | ICD-10-CM

## 2021-09-07 DIAGNOSIS — K219 Gastro-esophageal reflux disease without esophagitis: Secondary | ICD-10-CM | POA: Diagnosis not present

## 2021-09-07 DIAGNOSIS — I509 Heart failure, unspecified: Secondary | ICD-10-CM | POA: Diagnosis not present

## 2021-09-07 DIAGNOSIS — Z7901 Long term (current) use of anticoagulants: Secondary | ICD-10-CM | POA: Diagnosis not present

## 2021-09-07 DIAGNOSIS — K317 Polyp of stomach and duodenum: Secondary | ICD-10-CM

## 2021-09-07 DIAGNOSIS — I110 Hypertensive heart disease with heart failure: Secondary | ICD-10-CM

## 2021-09-07 DIAGNOSIS — I429 Cardiomyopathy, unspecified: Secondary | ICD-10-CM | POA: Diagnosis not present

## 2021-09-07 DIAGNOSIS — K319 Disease of stomach and duodenum, unspecified: Secondary | ICD-10-CM | POA: Diagnosis not present

## 2021-09-07 DIAGNOSIS — I4891 Unspecified atrial fibrillation: Secondary | ICD-10-CM | POA: Insufficient documentation

## 2021-09-07 DIAGNOSIS — K7460 Unspecified cirrhosis of liver: Secondary | ICD-10-CM | POA: Diagnosis not present

## 2021-09-07 DIAGNOSIS — G4730 Sleep apnea, unspecified: Secondary | ICD-10-CM | POA: Diagnosis not present

## 2021-09-07 DIAGNOSIS — R6889 Other general symptoms and signs: Secondary | ICD-10-CM | POA: Diagnosis not present

## 2021-09-07 DIAGNOSIS — Z9049 Acquired absence of other specified parts of digestive tract: Secondary | ICD-10-CM | POA: Insufficient documentation

## 2021-09-07 HISTORY — PX: BIOPSY: SHX5522

## 2021-09-07 HISTORY — PX: ESOPHAGOGASTRODUODENOSCOPY (EGD) WITH PROPOFOL: SHX5813

## 2021-09-07 SURGERY — ESOPHAGOGASTRODUODENOSCOPY (EGD) WITH PROPOFOL
Anesthesia: Monitor Anesthesia Care

## 2021-09-07 MED ORDER — PROPOFOL 10 MG/ML IV BOLUS
INTRAVENOUS | Status: DC | PRN
  Administered 2021-09-07 (×2): 40 mg via INTRAVENOUS
  Administered 2021-09-07: 20 mg via INTRAVENOUS

## 2021-09-07 MED ORDER — LACTATED RINGERS IV SOLN: INTRAVENOUS | Status: DC

## 2021-09-07 MED ORDER — LIDOCAINE 2% (20 MG/ML) 5 ML SYRINGE
INTRAMUSCULAR | Status: DC | PRN
  Administered 2021-09-07: 80 mg via INTRAVENOUS

## 2021-09-07 SURGICAL SUPPLY — 15 items

## 2021-09-07 NOTE — Anesthesia Postprocedure Evaluation (Signed)
Anesthesia Post Note  Patient: Paula Booker  Procedure(s) Performed: ESOPHAGOGASTRODUODENOSCOPY (EGD) WITH PROPOFOL BIOPSY     Patient location during evaluation: PACU Anesthesia Type: MAC Level of consciousness: awake and alert Pain management: pain level controlled Vital Signs Assessment: post-procedure vital signs reviewed and stable Respiratory status: spontaneous breathing, nonlabored ventilation and respiratory function stable Cardiovascular status: blood pressure returned to baseline Postop Assessment: no apparent nausea or vomiting Anesthetic complications: no   No notable events documented.  Last Vitals:  Vitals:   09/07/21 1053 09/07/21 1100  BP: 117/81 139/81  Pulse: 70 70  Resp: 18 (!) 22  Temp: (!) 36 C   SpO2: 100% 99%    Last Pain:  Vitals:   09/07/21 1053  TempSrc: Temporal  PainSc: 0-No pain                 Marthenia Rolling

## 2021-09-07 NOTE — Transfer of Care (Signed)
Immediate Anesthesia Transfer of Care Note  Patient: Paula Booker  Procedure(s) Performed: ESOPHAGOGASTRODUODENOSCOPY (EGD) WITH PROPOFOL BIOPSY  Patient Location: PACU  Anesthesia Type:MAC  Level of Consciousness: awake, alert  and oriented  Airway & Oxygen Therapy: Patient Spontanous Breathing and Patient connected to face mask oxygen  Post-op Assessment: Report given to RN and Post -op Vital signs reviewed and stable  Post vital signs: Reviewed and stable  Last Vitals:  Vitals Value Taken Time  BP 117/81 09/07/21 1053  Temp 36 C 09/07/21 1053  Pulse 69 09/07/21 1054  Resp 18 09/07/21 1054  SpO2 100 % 09/07/21 1054  Vitals shown include unvalidated device data.  Last Pain:  Vitals:   09/07/21 1053  TempSrc: Temporal  PainSc: 0-No pain         Complications: No notable events documented.

## 2021-09-07 NOTE — H&P (Signed)
Primary Care Physician:  [name], MD Primary Gastroenterologist:  Dr. Alessandra Bevels  Reason for Visit : Outpatient EGD for cirrhosis  HPI: Paula Booker is a 76 y.o. female with past medical history of cardiomyopathy with EF of 25 to 30% status post. 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  She denies any GI symptoms.  She reports questionable history of esophageal stricture in the past.  Denies any trouble swallowing or pain while swallowing at this time.  Eliquis  on hold for last 2 days  Past Medical History:  Diagnosis Date   AICD (automatic cardioverter/defibrillator) present    Anemia    as a child   Anxiety    Arthritis    "hands and knees" (03/22/2012)   Asthma    Basal cell carcinoma 04/27/2011   mid forehead(MOHS)   Bursitis    CHF (congestive heart failure) ([place]) 09/2011   pt. denies   Depression    Dysrhythmia    WPW   GERD (gastroesophageal reflux disease)    Head injury, acute, with loss of consciousness ([place])    History of hiatal hernia    Hypertension    ICD (implantable cardiac defibrillator) in place 03/2012   Migraines    migraines    NICM (nonischemic cardiomyopathy) ([place])    Obesity (BMI 30-39.9)    negative sleep study 2014   OSA (obstructive sleep apnea) 05/27/2018   Mild with AHI 6/hr    no cpap   Pneumonia    hx   Shortness of breath    "@ any time I can get SOB" (03/22/2012)   Skin cancer 1999   Squamous cell carcinoma of skin 07/08/2020   in situ right malar cheek-[id number]   Sudden arrhythmia death syndrome    WPW (Wolff-Parkinson-White syndrome)     Past Surgical History:  Procedure Laterality Date   CARDIAC CATHETERIZATION  09/18/11   no significant CAD   CARDIAC DEFIBRILLATOR PLACEMENT  03/22/2012   DDD   CHOLECYSTECTOMY  ~ 2003   IMPLANTABLE CARDIOVERTER DEFIBRILLATOR IMPLANT N/A 03/22/2012   Procedure: IMPLANTABLE CARDIOVERTER DEFIBRILLATOR IMPLANT;  Surgeon: Evans Lance,  MD;  [facility];  Service: Cardiovascular;  Laterality: N/A;   KNEE ARTHROSCOPY Left 11/29/2017   Procedure: ARTHROSCOPY LEFT KNEE;  Surgeon: Dorna Leitz, MD;  Location: [facility] ORS;  Service: Orthopedics;  Laterality: Left;   KNEE ARTHROSCOPY Right 08/09/2019   Procedure: ARTHROSCOPY RIGHT KNEE, CHONDROPLASTY,  PARTIAL MEDIAL  MENSICECTOMY;  Surgeon: Dorna Leitz, MD;  Location: [facility] ORS;  Service: Orthopedics;  Laterality: Right;   KYPHOPLASTY N/A 11/27/2013   Procedure: KYPHOPLASTY;  Surgeon: Sinclair Ship, MD;  Location: [place];  Service: Orthopedics;  Laterality: N/A;  T9, T11 kyphoplasty   KYPHOPLASTY N/A 06/05/2014   Procedure: KYPHOPLASTY;  Surgeon: Sinclair Ship, MD;  Location: [place];  Service: Orthopedics;  Laterality: N/A;  T7 kyphoplasty   MOHS SURGERY  06/2011   "forehead" (03/22/2012)   [place]   "top of my head and my right back shoulder"   SKIN CANCER EXCISION     back   SKIN CANCER EXCISION     face   SUPRAVENTRICULAR TACHYCARDIA ABLATION  03/22/2012   unable to induce VT/RN report 03/22/2012   SUPRAVENTRICULAR TACHYCARDIA ABLATION N/A 03/22/2012   Procedure: SUPRAVENTRICULAR TACHYCARDIA ABLATION;  Surgeon: Evans Lance, MD;  Location: [facility] CATH LAB;  Service: Cardiovascular;  Laterality: N/A;   TONSILLECTOMY AND ADENOIDECTOMY  [place]    Prior to Admission medications   Medication Sig Start Date End Date Taking? Authorizing Provider  acetaminophen (TYLENOL) 500 MG tablet Take 500 mg by mouth in the morning and at bedtime.   Yes [provider]  albuterol (VENTOLIN HFA) 108 (90 Base) MCG/ACT inhaler Inhale 2 puffs into the lungs every 6 (six) hours as needed for wheezing. For wheezing. Use 2 puffs 3 times daily x5 days and then every 6 hours as needed. 01/14/20  Yes Mannam, Hart Robinsons, MD  apixaban (ELIQUIS) 5 MG TABS tablet Take 1 tablet by mouth twice daily 03/25/21  Yes Evans Lance, MD  Calcium  Carb-Cholecalciferol 600-20 MG-MCG TABS Take 1 tablet by mouth in the morning.   Yes [provider]  carvedilol (COREG) 6.25 MG tablet Take 1 tablet (6.25 mg total) by mouth 2 (two) times daily. 06/04/21  Yes Evans Lance, MD  cetirizine (ZYRTEC) 10 MG tablet Take 10 mg by mouth in the morning.   Yes [provider]  Cholecalciferol (VITAMIN D3) 5000 units CAPS Take 5,000 Units by mouth in the morning.   Yes [provider]  Collagen-Vitamin C (COLLAGEN PLUS VITAMIN C PO) Take 1 tablet by mouth in the morning.   Yes [provider]  Cyanocobalamin (VITAMIN B-12) 5000 MCG LOZG Take 5,000 mcg by mouth in the morning.   Yes [provider]  empagliflozin (JARDIANCE) 10 MG TABS tablet Take 1 tablet (10 mg total) by mouth daily before breakfast. 04/13/21  Yes Evans Lance, MD  escitalopram (LEXAPRO) 20 MG tablet Take 20 mg by mouth in the morning. 10/23/20  Yes [provider]  fluticasone furoate-vilanterol (BREO ELLIPTA) 200-25 MCG/INH AEPB Inhale 1 puff by mouth once daily 06/11/20  Yes Mannam, Praveen, MD  furosemide (LASIX) 40 MG tablet TAKE 1 TO 2 TABLETS BY MOUTH AS DIRECTED ONCE DAILY FOR EDEMA ,  SHORTNESS  OF  BREATH  OR  WEIGHT  GAIN 05/27/21  Yes Evans Lance, MD  ipratropium-albuterol (DUONEB) 0.5-2.5 (3) MG/3ML SOLN Take 3 mLs by nebulization every 4 (four) hours as needed. 10/09/17  Yes Lauraine Rinne, NP  ketoconazole (NIZORAL) 2 % cream Apply 1 application. topically 2 (two) times daily. daily Patient taking differently: Apply 1 application. topically at bedtime as needed (raw area(s)--skin folds.). 08/12/21 12/10/21 Yes Sheffield, Kelli R, PA-C  lidocaine 4 % Place 1 patch onto the skin daily as needed (knee pain.).   Yes [provider]  Multiple Vitamin (MULTIVITAMIN WITH MINERALS) TABS tablet Take 1 tablet by mouth in the morning.   Yes [provider]  mupirocin ointment (BACTROBAN) 2 % Apply 1 application. topically  2 (two) times daily. QAM-morning Patient taking differently: Apply 1 application. topically daily as needed (raw area(s)--skin folds.). 08/12/21 09/11/21 Yes Sheffield, Ronalee Red, PA-C  pantoprazole (PROTONIX) 40 MG tablet Take 40 mg by mouth daily before breakfast. 08/29/17  Yes [provider]  rosuvastatin (CRESTOR) 5 MG tablet Take 5 mg by mouth at bedtime. 11/18/20  Yes [provider]  sacubitril-valsartan (ENTRESTO) 24-26 MG Take 1 tablet by mouth 2 (two) times daily. 04/09/21  Yes Evans Lance, MD  spironolactone (ALDACTONE) 25 MG tablet Take 1 tablet (25 mg total) by mouth daily. 08/19/21  Yes Jettie Booze, MD  alclomethasone (ACLOVATE) 0.05 % cream Apply topically 2 (two) times daily as needed (Rash). QHS Patient not taking: Reported on 09/03/2021 08/12/21 09/11/21  Warren Danes,  PA-C  cyclobenzaprine (FLEXERIL) 10 MG tablet Take 1 tablet (10 mg total) by mouth 2 (two) times daily as needed for muscle spasms. Patient not taking: Reported on 09/03/2021 06/16/21   Regan Lemming, MD  gabapentin (NEURONTIN) 100 MG capsule Take 1 capsule (100 mg total) by mouth 3 (three) times daily as needed. Patient not taking: Reported on 09/03/2021 11/05/20   Melvenia Beam, MD  lidocaine (LIDODERM) 5 % Place 1 patch onto the skin daily. Remove & Discard patch within 12 hours or as directed by MD Patient not taking: Reported on 09/03/2021 06/16/21   Regan Lemming, MD    Scheduled Meds: Continuous Infusions:  lactated ringers 10 mL/hr at 09/07/21 1020   PRN Meds:.  Allergies as of 07/06/2021 - Review Complete 06/16/2021  Allergen Reaction Noted   Ivp dye [iodinated contrast media] Hives and Other (See Comments) 03/07/2012   Penicillins Anaphylaxis 09/12/2011   Sulfonamide derivatives Other (See Comments) 09/12/2011   Tessalon [benzonatate] Hives and Rash 04/03/2015   Sulfa antibiotics Other (See Comments) 10/16/2014   Rosuvastatin calcium Other (See Comments) 03/17/2011   Onion  Other (See Comments) 04/23/2012    Family History  Problem Relation Age of Onset   Congestive Heart Failure Mother        died 15   Kidney Stones Mother    Deafness Mother    Atrial fibrillation Mother    Congestive Heart Failure Father    Deafness Father    Healthy Sister    Healthy Sister    Healthy Sister    Congestive Heart Failure Brother    Atrial fibrillation Brother    Tuberculosis Paternal Grandfather    Migraines Neg Hx    Neural tube defect Neg Hx     Social History   Socioeconomic History   Marital status: Divorced    Spouse name: Not on file   Number of children: 3   Years of education: Not on file   Highest education level: Not on file  Occupational History   Occupation: retired    Comment: Optometrist  Tobacco Use   Smoking status: Never   Smokeless tobacco: Never  Vaping Use   Vaping Use: Never used  Substance and Sexual Activity   Alcohol use: Not Currently    Comment: rare   Drug use: No   Sexual activity: Not Currently  Other Topics Concern   Not on file  Social History Narrative   Divorced, 3 children, 9 grandchildren. Did work for [employer]. Retired at 76 y/o .   Social Determinants of Health   Financial Resource Strain: Not on file  Food Insecurity: Not on file  Transportation Needs: Not on file  Physical Activity: Not on file  Stress: Not on file  Social Connections: Not on file  Intimate Partner Violence: Not on file    Review of Systems: All negative except as stated above in HPI.  Physical Exam: Vital signs: Vitals:   09/07/21 0943  BP: (!) 133/49  Pulse: 73  Resp: 13  Temp: (!) 97.4 F (36.3 C)  SpO2: 98%     General:   Alert,  Well-developed, well-nourished, pleasant and cooperative in NAD Lungs:  Clear throughout to auscultation.   No wheezes, crackles, or rhonchi. No acute distress. Heart:  Regular rate and rhythm; no murmurs, clicks, rubs,  or gallops. Abdomen: Soft, nontender, nondistended, bowel sounds  present, no peritoneal signs Rectal:  Deferred  GI:  Lab Results: No results for input(s): WBC, HGB, HCT, PLT in  the last 72 hours. BMET No results for input(s): NA, K, CL, CO2, GLUCOSE, BUN, CREATININE, CALCIUM in the last 72 hours. LFT No results for input(s): PROT, ALBUMIN, AST, ALT, ALKPHOS, BILITOT, BILIDIR, IBILI in the last 72 hours. PT/INR No results for input(s): LABPROT, INR in the last 72 hours.   Studies/Results: No results found.  Impression/Plan: -Cirrhosis of the liver -Cardiomyopathy with EF of 25 to 30% -WPW syndrome on Eliquis.  Currently on hold for 2 days.  Recommendations ----------------------------- -Proceed with EGD with possible band ligation. -Patient is already on carvedilol  Risks (bleeding, infection, bowel perforation that could require surgery, sedation-related changes in cardiopulmonary systems), benefits (identification and possible treatment of source of symptoms, exclusion of certain causes of symptoms), and alternatives (watchful waiting, radiographic imaging studies, empiric medical treatment)  were explained to patient/family in detail and patient wishes to proceed.     LOS: 0 days   Otis Brace  MD, FACP 09/07/2021, 10:22 AM  Contact #  [phone]

## 2021-09-07 NOTE — Op Note (Signed)
[facility] Patient Name: Paula Booker Procedure Date: 09/07/2021 MRN: [id number] Attending MD: Otis Brace , MD Date of Birth: 04-11-1946 CSN: [id number] Age: 76 Admit Type: Outpatient Procedure:                Upper GI endoscopy Indications:              Cirrhosis rule out esophageal varices Providers:                Otis Brace, MD, Allayne Gitelman, RN, Benetta Spar, Technician Referring MD:              Medicines:                Sedation Administered by an Anesthesia Professional Complications:            No immediate complications. Estimated Blood Loss:     Estimated blood loss was minimal. Procedure:                Pre-Anesthesia Assessment:                           - Prior to the procedure, a History and Physical                            was performed, and patient medications and                            allergies were reviewed. The patient's tolerance of                            previous anesthesia was also reviewed. The risks                            and benefits of the procedure and the sedation                            options and risks were discussed with the patient.                            All questions were answered, and informed consent                            was obtained. Prior Anticoagulants: The patient has                            taken Eliquis (apixaban), last dose was 2 days                            prior to procedure. ASA Grade Assessment: IV - A                            patient with severe systemic disease that is a  constant threat to life. After reviewing the risks                            and benefits, the patient was deemed in                            satisfactory condition to undergo the procedure.                           After obtaining informed consent, the endoscope was                            passed under direct vision. Throughout the                             procedure, the patient's blood pressure, pulse, and                            oxygen saturations were monitored continuously. The                            [id number]-H190 ([id number]) Olympus endoscope was introduced                            through the mouth, and advanced to the second part                            of duodenum. The upper GI endoscopy was                            accomplished without difficulty. The patient                            tolerated the procedure well. Scope In: Scope Out: Findings:      The Z-line was regular and was found 37 cm from the incisors.      There is no endoscopic evidence of varices in the entire esophagus.      Scattered mild inflammation characterized by erythema was found in the       gastric antrum and in the prepyloric region of the stomach. Biopsies       were taken with a cold forceps for histology.      A few diminutive sessile polyps were found in the gastric fundus and in       the gastric body. Biopsies were taken with a cold forceps for histology.      The cardia and gastric fundus were normal on retroflexion.      The duodenal bulb, first portion of the duodenum and second portion of       the duodenum were normal. Impression:               - Z-line regular, 37 cm from the incisors.                           - Gastritis. Biopsied.                           -  A few gastric polyps. Biopsied.                           - Normal duodenal bulb, first portion of the                            duodenum and second portion of the duodenum. Moderate Sedation:      Moderate (conscious) sedation was personally administered by an       anesthesia professional. The following parameters were monitored: oxygen       saturation, heart rate, blood pressure, and response to care. Recommendation:           - Patient has a contact number available for                            emergencies. The signs and symptoms of potential                             delayed complications were discussed with the                            patient. Return to normal activities tomorrow.                            Written discharge instructions were provided to the                            patient.                           - Resume previous diet.                           - Continue present medications.                           - Await pathology results.                           - Return to my office as previously scheduled. Procedure Code(s):        --- Professional ---                           [phone], Esophagogastroduodenoscopy, flexible,                            transoral; with biopsy, single or multiple Diagnosis Code(s):        --- Professional ---                           K29.70, Gastritis, unspecified, without bleeding                           K31.7, Polyp of stomach and duodenum                           K74.60, Unspecified  cirrhosis of liver CPT copyright 2019 American Medical Association. All rights reserved. The codes documented in this report are preliminary and upon coder review may  be revised to meet current compliance requirements. Otis Brace, MD Otis Brace, MD 09/07/2021 10:59:43 AM Number of Addenda: 0

## 2021-09-07 NOTE — Discharge Instructions (Signed)

## 2021-09-07 NOTE — Anesthesia Preprocedure Evaluation (Addendum)
Anesthesia Evaluation  Patient identified by MRN, date of birth, ID band Patient awake    Reviewed: Allergy & Precautions, NPO status , Patient's Chart, lab work & pertinent test results, reviewed documented beta blocker date and time   History of Anesthesia Complications Negative for: history of anesthetic complications  Airway Mallampati: II  TM Distance: >3 FB Neck ROM: Full    Dental  (+) Edentulous Upper, Missing,    Pulmonary asthma , sleep apnea and Continuous Positive Airway Pressure Ventilation ,    Pulmonary exam normal        Cardiovascular hypertension, Pt. on medications and Pt. on home beta blockers +CHF  DOE: hx of WPW.  Normal cardiovascular exam+ dysrhythmias Atrial Fibrillation + pacemaker + Cardiac Defibrillator   TTE 2021: EF 25-30%, global hypokinesis, mild LVE, grade I DD, mildly elevated PASP     Neuro/Psych  Headaches, Anxiety Depression    GI/Hepatic hiatal hernia, GERD  Medicated and Controlled,(+) Cirrhosis       ,   Endo/Other  negative endocrine ROS  Renal/GU negative Renal ROS  negative genitourinary   Musculoskeletal  (+) Arthritis ,   Abdominal   Peds  Hematology negative hematology ROS (+)   Anesthesia Other Findings Day of surgery medications reviewed with patient.  Reproductive/Obstetrics negative OB ROS                            Anesthesia Physical Anesthesia Plan  ASA: 4  Anesthesia Plan: MAC   Post-op Pain Management: Minimal or no pain anticipated   Induction:   PONV Risk Score and Plan: Treatment may vary due to age or medical condition and Propofol infusion  Airway Management Planned: Natural Airway and Simple Face Mask  Additional Equipment: None  Intra-op Plan:   Post-operative Plan:   Informed Consent: I have reviewed the patients History and Physical, chart, labs and discussed the procedure including the risks, benefits and  alternatives for the proposed anesthesia with the patient or authorized representative who has indicated his/her understanding and acceptance.       Plan Discussed with: CRNA  Anesthesia Plan Comments:        Anesthesia Quick Evaluation

## 2021-09-08 LAB — SURGICAL PATHOLOGY

## 2021-09-28 ENCOUNTER — Other Ambulatory Visit: Payer: Self-pay | Admitting: Internal Medicine

## 2021-09-28 DIAGNOSIS — I480 Paroxysmal atrial fibrillation: Secondary | ICD-10-CM

## 2021-09-28 NOTE — Telephone Encounter (Signed)
Prescription refill request for Eliquis received. Indication:Afib Last office visit:5/23 Scr:0.9 Age: 76 Weight:87.5 kg  Prescription refilled

## 2021-10-05 ENCOUNTER — Ambulatory Visit (INDEPENDENT_AMBULATORY_CARE_PROVIDER_SITE_OTHER): Payer: Medicare HMO

## 2021-10-05 DIAGNOSIS — I428 Other cardiomyopathies: Secondary | ICD-10-CM

## 2021-10-07 LAB — CUP PACEART REMOTE DEVICE CHECK
Battery Remaining Longevity: 7 mo
Battery Voltage: 2.84 V
Brady Statistic AP VP Percent: 0.01 %
Brady Statistic AP VS Percent: 2.65 %
Brady Statistic AS VP Percent: 0.03 %
Brady Statistic AS VS Percent: 97.32 %
Brady Statistic RA Percent Paced: 2.64 %
Brady Statistic RV Percent Paced: 0.04 %
Date Time Interrogation Session: 20230628143924
HighPow Impedance: 92 Ohm
Implantable Lead Implant Date: 20131212
Implantable Lead Implant Date: 20131212
Implantable Lead Location: 753859
Implantable Lead Location: 753860
Implantable Lead Model: 5076
Implantable Lead Model: 6935
Implantable Pulse Generator Implant Date: 20131212
Lead Channel Impedance Value: 513 Ohm
Lead Channel Impedance Value: 608 Ohm
Lead Channel Impedance Value: 817 Ohm
Lead Channel Pacing Threshold Amplitude: 0.375 V
Lead Channel Pacing Threshold Amplitude: 0.5 V
Lead Channel Pacing Threshold Pulse Width: 0.4 ms
Lead Channel Pacing Threshold Pulse Width: 0.4 ms
Lead Channel Sensing Intrinsic Amplitude: 3.125 mV
Lead Channel Sensing Intrinsic Amplitude: 3.125 mV
Lead Channel Sensing Intrinsic Amplitude: 6.875 mV
Lead Channel Sensing Intrinsic Amplitude: 6.875 mV
Lead Channel Setting Pacing Amplitude: 2 V
Lead Channel Setting Pacing Amplitude: 2.5 V
Lead Channel Setting Pacing Pulse Width: 0.4 ms
Lead Channel Setting Sensing Sensitivity: 0.3 mV

## 2021-10-08 ENCOUNTER — Ambulatory Visit (INDEPENDENT_AMBULATORY_CARE_PROVIDER_SITE_OTHER): Payer: Medicare HMO

## 2021-10-08 DIAGNOSIS — I5022 Chronic systolic (congestive) heart failure: Secondary | ICD-10-CM

## 2021-10-08 DIAGNOSIS — Z95810 Presence of automatic (implantable) cardiac defibrillator: Secondary | ICD-10-CM

## 2021-10-08 NOTE — Progress Notes (Signed)
EPIC Encounter for ICM Monitoring  Patient Name: Paula Booker is a 76 y.o. female Date: 10/08/2021 Primary Care Physican: [name], MD Primary Cardiologist: Irish Lack Electrophysiologist: Lovena Le 08/19/2021 Office Weight: 201 lbs 10/08/2021 Weight: 196 lbs   Time in AT/AF  0.0 hr/day (0.0%)         Spoke with patient and heart failure questions reviewed.  Pt asymptomatic for fluid accumulation.  She has nutritionist appointment on 10/18/2021.    Optivol thoracic impedance suggesting normal fluid levels.   Prescribed:  Furosemide 40 mg take 1-2 tablets (40 mg - 80 mg total) by mouth as directed once daily for edema, SOB or weight gain. Spironolactone 25 mg take 1 tablet by mouth daily   Labs: 06/16/2021 Creatinine 0.90, BUN 16, Potassium 4.2, Sodium 138, GFR >60 05/13/2021 Creatinine 0.95, BUN 9,   Potassium 4.3, Sodium 140, GFR 62 A complete set of results can be found in Results Review.   Recommendations:  Recommendation to limit fluid intake to 64 oz daily.  Encouraged to call if experiencing any fluid symptoms.    Follow-up plan: ICM clinic phone appointment on 11/08/2021.   91 day device clinic remote transmission 01/04/2022.     EP/Cardiology Office Visits:  Recall 03/13/2022 with Dr Lovena Le.  Recall 08/14/2022 with Dr Irish Lack.     Copy of ICM check sent to Dr. Lovena Le.   3 month ICM trend: 10/06/2021.    12-14 Month ICM trend:     [name], RN 10/08/2021 12:58 PM

## 2021-10-14 ENCOUNTER — Other Ambulatory Visit: Payer: Self-pay | Admitting: Gastroenterology

## 2021-10-14 DIAGNOSIS — Z8679 Personal history of other diseases of the circulatory system: Secondary | ICD-10-CM | POA: Diagnosis not present

## 2021-10-14 DIAGNOSIS — K219 Gastro-esophageal reflux disease without esophagitis: Secondary | ICD-10-CM | POA: Diagnosis not present

## 2021-10-14 DIAGNOSIS — K7460 Unspecified cirrhosis of liver: Secondary | ICD-10-CM | POA: Diagnosis not present

## 2021-10-14 DIAGNOSIS — Z8719 Personal history of other diseases of the digestive system: Secondary | ICD-10-CM | POA: Diagnosis not present

## 2021-10-14 DIAGNOSIS — K9089 Other intestinal malabsorption: Secondary | ICD-10-CM | POA: Diagnosis not present

## 2021-10-15 DIAGNOSIS — I10 Essential (primary) hypertension: Secondary | ICD-10-CM | POA: Diagnosis not present

## 2021-10-15 DIAGNOSIS — E782 Mixed hyperlipidemia: Secondary | ICD-10-CM | POA: Diagnosis not present

## 2021-10-15 DIAGNOSIS — I480 Paroxysmal atrial fibrillation: Secondary | ICD-10-CM | POA: Diagnosis not present

## 2021-10-15 DIAGNOSIS — R7309 Other abnormal glucose: Secondary | ICD-10-CM | POA: Diagnosis not present

## 2021-10-15 DIAGNOSIS — K219 Gastro-esophageal reflux disease without esophagitis: Secondary | ICD-10-CM | POA: Diagnosis not present

## 2021-10-15 DIAGNOSIS — K7460 Unspecified cirrhosis of liver: Secondary | ICD-10-CM | POA: Diagnosis not present

## 2021-10-15 DIAGNOSIS — I428 Other cardiomyopathies: Secondary | ICD-10-CM | POA: Diagnosis not present

## 2021-10-15 DIAGNOSIS — M109 Gout, unspecified: Secondary | ICD-10-CM | POA: Diagnosis not present

## 2021-10-15 DIAGNOSIS — F419 Anxiety disorder, unspecified: Secondary | ICD-10-CM | POA: Diagnosis not present

## 2021-10-18 DIAGNOSIS — M109 Gout, unspecified: Secondary | ICD-10-CM | POA: Diagnosis not present

## 2021-10-20 ENCOUNTER — Ambulatory Visit
Admission: RE | Admit: 2021-10-20 | Discharge: 2021-10-20 | Disposition: A | Discharge status: Home / Self Care | Payer: Medicare HMO | Source: Ambulatory Visit | Attending: Gastroenterology | Admitting: Gastroenterology

## 2021-10-20 DIAGNOSIS — N281 Cyst of kidney, acquired: Secondary | ICD-10-CM | POA: Diagnosis not present

## 2021-10-20 DIAGNOSIS — K7689 Other specified diseases of liver: Secondary | ICD-10-CM | POA: Diagnosis not present

## 2021-10-20 DIAGNOSIS — Z9049 Acquired absence of other specified parts of digestive tract: Secondary | ICD-10-CM | POA: Diagnosis not present

## 2021-10-20 DIAGNOSIS — K7460 Unspecified cirrhosis of liver: Secondary | ICD-10-CM

## 2021-10-26 DIAGNOSIS — K219 Gastro-esophageal reflux disease without esophagitis: Secondary | ICD-10-CM | POA: Diagnosis not present

## 2021-10-26 DIAGNOSIS — Z6835 Body mass index (BMI) 35.0-35.9, adult: Secondary | ICD-10-CM | POA: Diagnosis not present

## 2021-10-26 DIAGNOSIS — G4733 Obstructive sleep apnea (adult) (pediatric): Secondary | ICD-10-CM | POA: Diagnosis not present

## 2021-10-26 DIAGNOSIS — I509 Heart failure, unspecified: Secondary | ICD-10-CM | POA: Diagnosis not present

## 2021-10-26 DIAGNOSIS — E669 Obesity, unspecified: Secondary | ICD-10-CM | POA: Diagnosis not present

## 2021-10-26 DIAGNOSIS — K7460 Unspecified cirrhosis of liver: Secondary | ICD-10-CM | POA: Diagnosis not present

## 2021-10-26 DIAGNOSIS — M109 Gout, unspecified: Secondary | ICD-10-CM | POA: Diagnosis not present

## 2021-10-26 NOTE — Progress Notes (Signed)
Remote ICD transmission.   

## 2021-11-08 ENCOUNTER — Ambulatory Visit (INDEPENDENT_AMBULATORY_CARE_PROVIDER_SITE_OTHER): Payer: Medicare HMO

## 2021-11-08 DIAGNOSIS — I5022 Chronic systolic (congestive) heart failure: Secondary | ICD-10-CM | POA: Diagnosis not present

## 2021-11-08 DIAGNOSIS — Z95810 Presence of automatic (implantable) cardiac defibrillator: Secondary | ICD-10-CM | POA: Diagnosis not present

## 2021-11-10 ENCOUNTER — Telehealth: Payer: Self-pay

## 2021-11-10 NOTE — Progress Notes (Addendum)
EPIC Encounter for ICM Monitoring  Patient Name: Paula Booker is a 76 y.o. female Date: 11/10/2021 Primary Care Physican: [name], MD Primary Cardiologist: Irish Lack Electrophysiologist: Lovena Le 08/19/2021 Office Weight: 201 lbs 11/10/2021 Weight: 191 lbs   Time in AT/AF  0.0 hr/day (0.0%)  Battery Longevity:  2 months (Discussed battery replacement 11/10/2021)         Spoke with patient and heart failure questions reviewed.  Pt asymptomatic for fluid accumulation.  Reports feeling well at this time and voices no complaints.    Optivol thoracic impedance suggesting normal fluid levels.   Prescribed:  Furosemide 40 mg take 1-2 tablets (40 mg - 80 mg total) by mouth as directed once daily for edema, SOB or weight gain. Spironolactone 25 mg take 1 tablet by mouth daily   Labs: 06/16/2021 Creatinine 0.90, BUN 16, Potassium 4.2, Sodium 138, GFR >60 05/13/2021 Creatinine 0.95, BUN 9,   Potassium 4.3, Sodium 140, GFR 62 A complete set of results can be found in Results Review.   Recommendations:  No changes and encouraged to call if experiencing any fluid symptoms.   Follow-up plan: ICM clinic phone appointment on 12/14/2021.   91 day device clinic remote transmission 01/04/2022.     EP/Cardiology Office Visits:  Recall 03/13/2022 with Dr Lovena Le.  Recall 08/14/2022 with Dr Irish Lack.     Copy of ICM check sent to Dr. Lovena Le.   3 month ICM trend: 11/08/2021.    12-14 Month ICM trend:     [name], RN 11/10/2021 3:44 PM

## 2021-11-10 NOTE — Telephone Encounter (Signed)
Remote ICM transmission received.  Attempted call to patient regarding ICM remote transmission and left detailed message per DPR.  Advised to return call for any fluid symptoms or questions. Next ICM remote transmission scheduled 12/14/2021.    

## 2021-11-10 NOTE — Telephone Encounter (Signed)
Remote ICM transmission received.  Attempted call to patient regarding ICM remote transmission and no answer.  

## 2021-11-23 DIAGNOSIS — I10 Essential (primary) hypertension: Secondary | ICD-10-CM | POA: Diagnosis not present

## 2021-11-23 DIAGNOSIS — M25562 Pain in left knee: Secondary | ICD-10-CM | POA: Diagnosis not present

## 2021-11-23 DIAGNOSIS — E669 Obesity, unspecified: Secondary | ICD-10-CM | POA: Diagnosis not present

## 2021-11-23 DIAGNOSIS — E782 Mixed hyperlipidemia: Secondary | ICD-10-CM | POA: Diagnosis not present

## 2021-11-23 DIAGNOSIS — I509 Heart failure, unspecified: Secondary | ICD-10-CM | POA: Diagnosis not present

## 2021-11-23 DIAGNOSIS — M170 Bilateral primary osteoarthritis of knee: Secondary | ICD-10-CM | POA: Diagnosis not present

## 2021-11-23 DIAGNOSIS — M25561 Pain in right knee: Secondary | ICD-10-CM | POA: Diagnosis not present

## 2021-11-23 DIAGNOSIS — M1711 Unilateral primary osteoarthritis, right knee: Secondary | ICD-10-CM | POA: Diagnosis not present

## 2021-11-23 DIAGNOSIS — K219 Gastro-esophageal reflux disease without esophagitis: Secondary | ICD-10-CM | POA: Diagnosis not present

## 2021-11-23 DIAGNOSIS — Z6834 Body mass index (BMI) 34.0-34.9, adult: Secondary | ICD-10-CM | POA: Diagnosis not present

## 2021-11-23 DIAGNOSIS — M1712 Unilateral primary osteoarthritis, left knee: Secondary | ICD-10-CM | POA: Diagnosis not present

## 2021-11-23 DIAGNOSIS — K7460 Unspecified cirrhosis of liver: Secondary | ICD-10-CM | POA: Diagnosis not present

## 2021-11-25 ENCOUNTER — Encounter (HOSPITAL_COMMUNITY): Payer: Self-pay | Admitting: Emergency Medicine

## 2021-11-25 ENCOUNTER — Emergency Department (HOSPITAL_COMMUNITY): Payer: Medicare HMO

## 2021-11-25 ENCOUNTER — Other Ambulatory Visit: Payer: Self-pay

## 2021-11-25 ENCOUNTER — Emergency Department (HOSPITAL_COMMUNITY)
Admission: EM | Admit: 2021-11-25 | Discharge: 2021-11-25 | Disposition: A | Discharge status: Home / Self Care | Payer: Medicare HMO | Attending: Emergency Medicine | Admitting: Emergency Medicine

## 2021-11-25 ENCOUNTER — Other Ambulatory Visit: Payer: Self-pay | Admitting: Internal Medicine

## 2021-11-25 ENCOUNTER — Telehealth: Payer: Self-pay

## 2021-11-25 DIAGNOSIS — E782 Mixed hyperlipidemia: Secondary | ICD-10-CM | POA: Insufficient documentation

## 2021-11-25 DIAGNOSIS — G8929 Other chronic pain: Secondary | ICD-10-CM | POA: Insufficient documentation

## 2021-11-25 DIAGNOSIS — C4491 Basal cell carcinoma of skin, unspecified: Secondary | ICD-10-CM | POA: Insufficient documentation

## 2021-11-25 DIAGNOSIS — R42 Dizziness and giddiness: Secondary | ICD-10-CM | POA: Insufficient documentation

## 2021-11-25 DIAGNOSIS — Z043 Encounter for examination and observation following other accident: Secondary | ICD-10-CM | POA: Diagnosis not present

## 2021-11-25 DIAGNOSIS — M810 Age-related osteoporosis without current pathological fracture: Secondary | ICD-10-CM | POA: Insufficient documentation

## 2021-11-25 DIAGNOSIS — R55 Syncope and collapse: Secondary | ICD-10-CM | POA: Diagnosis not present

## 2021-11-25 DIAGNOSIS — I509 Heart failure, unspecified: Secondary | ICD-10-CM | POA: Diagnosis not present

## 2021-11-25 DIAGNOSIS — K7460 Unspecified cirrhosis of liver: Secondary | ICD-10-CM | POA: Insufficient documentation

## 2021-11-25 DIAGNOSIS — J45909 Unspecified asthma, uncomplicated: Secondary | ICD-10-CM | POA: Diagnosis not present

## 2021-11-25 DIAGNOSIS — J4520 Mild intermittent asthma, uncomplicated: Secondary | ICD-10-CM | POA: Insufficient documentation

## 2021-11-25 DIAGNOSIS — F331 Major depressive disorder, recurrent, moderate: Secondary | ICD-10-CM | POA: Insufficient documentation

## 2021-11-25 DIAGNOSIS — I119 Hypertensive heart disease without heart failure: Secondary | ICD-10-CM | POA: Diagnosis not present

## 2021-11-25 DIAGNOSIS — X58XXXA Exposure to other specified factors, initial encounter: Secondary | ICD-10-CM | POA: Insufficient documentation

## 2021-11-25 DIAGNOSIS — K219 Gastro-esophageal reflux disease without esophagitis: Secondary | ICD-10-CM | POA: Diagnosis not present

## 2021-11-25 DIAGNOSIS — F332 Major depressive disorder, recurrent severe without psychotic features: Secondary | ICD-10-CM | POA: Insufficient documentation

## 2021-11-25 DIAGNOSIS — K909 Intestinal malabsorption, unspecified: Secondary | ICD-10-CM | POA: Insufficient documentation

## 2021-11-25 DIAGNOSIS — I499 Cardiac arrhythmia, unspecified: Secondary | ICD-10-CM | POA: Diagnosis not present

## 2021-11-25 DIAGNOSIS — I700 Atherosclerosis of aorta: Secondary | ICD-10-CM | POA: Insufficient documentation

## 2021-11-25 DIAGNOSIS — J45901 Unspecified asthma with (acute) exacerbation: Secondary | ICD-10-CM | POA: Insufficient documentation

## 2021-11-25 DIAGNOSIS — D6859 Other primary thrombophilia: Secondary | ICD-10-CM | POA: Insufficient documentation

## 2021-11-25 DIAGNOSIS — M179 Osteoarthritis of knee, unspecified: Secondary | ICD-10-CM | POA: Insufficient documentation

## 2021-11-25 DIAGNOSIS — J302 Other seasonal allergic rhinitis: Secondary | ICD-10-CM | POA: Insufficient documentation

## 2021-11-25 DIAGNOSIS — F5101 Primary insomnia: Secondary | ICD-10-CM | POA: Insufficient documentation

## 2021-11-25 DIAGNOSIS — I10 Essential (primary) hypertension: Secondary | ICD-10-CM | POA: Diagnosis not present

## 2021-11-25 DIAGNOSIS — M4850XD Collapsed vertebra, not elsewhere classified, site unspecified, subsequent encounter for fracture with routine healing: Secondary | ICD-10-CM | POA: Insufficient documentation

## 2021-11-25 DIAGNOSIS — I959 Hypotension, unspecified: Secondary | ICD-10-CM | POA: Diagnosis not present

## 2021-11-25 DIAGNOSIS — S060XAA Concussion with loss of consciousness status unknown, initial encounter: Secondary | ICD-10-CM | POA: Diagnosis not present

## 2021-11-25 DIAGNOSIS — M109 Gout, unspecified: Secondary | ICD-10-CM | POA: Insufficient documentation

## 2021-11-25 LAB — CBG MONITORING, ED: Glucose-Capillary: 125 mg/dL — ABNORMAL HIGH (ref 70–99)

## 2021-11-25 LAB — CBC WITH DIFFERENTIAL/PLATELET
Abs Immature Granulocytes: 0.11 10*3/uL — ABNORMAL HIGH (ref 0.00–0.07)
Basophils Absolute: 0 10*3/uL (ref 0.0–0.1)
Basophils Relative: 0 %
Eosinophils Absolute: 0 10*3/uL (ref 0.0–0.5)
Eosinophils Relative: 0 %
HCT: 43.1 % (ref 36.0–46.0)
Hemoglobin: 14.2 g/dL (ref 12.0–15.0)
Immature Granulocytes: 1 %
Lymphocytes Relative: 12 %
Lymphs Abs: 1.7 10*3/uL (ref 0.7–4.0)
MCH: 31.3 pg (ref 26.0–34.0)
MCHC: 32.9 g/dL (ref 30.0–36.0)
MCV: 95.1 fL (ref 80.0–100.0)
Monocytes Absolute: 1 10*3/uL (ref 0.1–1.0)
Monocytes Relative: 7 %
Neutro Abs: 11.1 10*3/uL — ABNORMAL HIGH (ref 1.7–7.7)
Neutrophils Relative %: 80 %
Platelets: 286 10*3/uL (ref 150–400)
RBC: 4.53 MIL/uL (ref 3.87–5.11)
RDW: 13 % (ref 11.5–15.5)
WBC: 13.9 10*3/uL — ABNORMAL HIGH (ref 4.0–10.5)
nRBC: 0 % (ref 0.0–0.2)

## 2021-11-25 LAB — BRAIN NATRIURETIC PEPTIDE: B Natriuretic Peptide: 147 pg/mL — ABNORMAL HIGH (ref 0.0–100.0)

## 2021-11-25 LAB — TROPONIN I (HIGH SENSITIVITY)
Troponin I (High Sensitivity): 11 ng/L (ref <18)
Troponin I (High Sensitivity): 6 ng/L (ref <18)

## 2021-11-25 LAB — COMPREHENSIVE METABOLIC PANEL
ALT: 17 U/L (ref 0–44)
AST: 21 U/L (ref 15–41)
Albumin: 4 g/dL (ref 3.5–5.0)
Alkaline Phosphatase: 67 U/L (ref 38–126)
Anion gap: 10 (ref 5–15)
BUN: 42 mg/dL — ABNORMAL HIGH (ref 8–23)
CO2: 25 mmol/L (ref 22–32)
Calcium: 9.5 mg/dL (ref 8.9–10.3)
Chloride: 103 mmol/L (ref 98–111)
Creatinine, Ser: 1.02 mg/dL — ABNORMAL HIGH (ref 0.44–1.00)
GFR, Estimated: 57 mL/min — ABNORMAL LOW (ref >60)
Glucose, Bld: 121 mg/dL — ABNORMAL HIGH (ref 70–99)
Potassium: 4.2 mmol/L (ref 3.5–5.1)
Sodium: 138 mmol/L (ref 135–145)
Total Bilirubin: 0.5 mg/dL (ref 0.3–1.2)
Total Protein: 6.7 g/dL (ref 6.5–8.1)

## 2021-11-25 LAB — URINALYSIS, ROUTINE W REFLEX MICROSCOPIC
Bilirubin Urine: NEGATIVE
Glucose, UA: >=500 mg/dL — AB
Ketones, ur: NEGATIVE mg/dL
Nitrite: NEGATIVE
Protein, ur: NEGATIVE mg/dL
Specific Gravity, Urine: 1.008 (ref 1.005–1.030)
pH: 6 (ref 5.0–8.0)

## 2021-11-25 MED ORDER — MECLIZINE HCL 25 MG PO TABS
25.0000 mg | ORAL_TABLET | Freq: Once | ORAL | Status: AC
Start: 2021-11-25 — End: 2021-11-25
  Administered 2021-11-25: 25 mg via ORAL
  Filled 2021-11-25: qty 1

## 2021-11-25 MED ORDER — MECLIZINE HCL 25 MG PO TABS: 25.0000 mg | ORAL_TABLET | Freq: Three times a day (TID) | ORAL | 0 refills | Status: DC | PRN

## 2021-11-25 NOTE — ED Provider Notes (Signed)
[facility] EMERGENCY DEPARTMENT Provider Note   CSN: [id number] Arrival date & time: 11/25/21  1357     History  Chief Complaint  Patient presents with   Loss of Consciousness    Paula Booker is a 76 y.o. female.  Patient is a 76 year old female with multiple medical problems including WPW status post AICD, nonischemic cardiomyopathy, CHF with last echo in 2021 with an EF of 25 to 30%, recent diagnosis of liver cirrhosis, hypertension and asthma who is presenting today after a syncopal event.  Patient reports this morning she felt completely fine she had gone to [employer] was able to walk through the store and had no symptoms.  She was at home making a casserole and reports she had taken out of the oven and went and sat down in her chair.  She was feeling totally fine at this time but when she got up to go check on the casserole after about 20 feet when she walked into the kitchen she started feeling unwell.  She describes a dizziness and a mild shortness of breath.  She grabbed a bowl and reports the next thing she remembered she was getting up off of the floor in the kitchen.  She did go to the refrigerator and got out a protein shake thinking maybe she needed to eat something but did have breakfast around 6 AM this morning and the event happened about 11:00 AM.  She denied any chest pain, palpitations, abdominal pain, nausea vomiting or diarrhea.  She has not had recent illness with fever or urinary symptoms.  She has had no cough.  She has been following a very strict diet since she learned she had liver cirrhosis which was nonalcoholic in nature.  She has lost 6 pounds in the last 1 month and denies recent swelling in her legs.  She has not changed any of her medications recently and is still compliant with Lasix and spironolactone.  After this event she called Dr. Tanna Furry office with EP cardiology and they ran a rhythm strip which is present in the chart reporting she had  no cardiac events today during this episode of syncope.  She reports now she feels fine when she is laying down but even when she was in the emergency room when she went to go to the bathroom she started feeling the same way again but did not lose consciousness.  She does report that sometimes at home her blood pressure drops and she usually lays down and it will eventually get better.  The history is provided by the patient and medical records.  Loss of Consciousness      Home Medications Prior to Admission medications   Medication Sig Start Date End Date Taking? Authorizing Provider  meclizine (ANTIVERT) 25 MG tablet Take 1 tablet (25 mg total) by mouth 3 (three) times daily as needed for dizziness. 11/25/21  Yes Blanchie Dessert, MD  acetaminophen (TYLENOL) 500 MG tablet Take 500 mg by mouth in the morning and at bedtime.    [provider]  albuterol (VENTOLIN HFA) 108 (90 Base) MCG/ACT inhaler Inhale 2 puffs into the lungs every 6 (six) hours as needed for wheezing. For wheezing. Use 2 puffs 3 times daily x5 days and then every 6 hours as needed. 01/14/20   Mannam, Hart Robinsons, MD  alclomethasone (ACLOVATE) 0.05 % cream Apply topically 2 (two) times daily as needed. 09/13/21   [provider]  apixaban (ELIQUIS) 5 MG TABS tablet Take 1 tablet  by mouth twice daily 09/28/21   Evans Lance, MD  Calcium Carb-Cholecalciferol 600-20 MG-MCG TABS Take 1 tablet by mouth in the morning.    [provider]  carvedilol (COREG) 6.25 MG tablet Take 1 tablet (6.25 mg total) by mouth 2 (two) times daily. 06/04/21   Evans Lance, MD  cetirizine (ZYRTEC) 10 MG tablet Take 10 mg by mouth in the morning.    [provider]  Cholecalciferol (VITAMIN D3) 5000 units CAPS Take 5,000 Units by mouth in the morning.    [provider]  colchicine 0.6 MG tablet Take by mouth. 10/15/21   [provider]  Collagen-Vitamin C (COLLAGEN PLUS VITAMIN C PO) Take 1 tablet by  mouth in the morning.    [provider]  Cyanocobalamin (VITAMIN B-12) 5000 MCG LOZG Take 5,000 mcg by mouth in the morning.    [provider]  cyclobenzaprine (FLEXERIL) 10 MG tablet Take 1 tablet (10 mg total) by mouth 2 (two) times daily as needed for muscle spasms. Patient not taking: Reported on 09/03/2021 06/16/21   Regan Lemming, MD  empagliflozin (JARDIANCE) 10 MG TABS tablet Take 1 tablet (10 mg total) by mouth daily before breakfast. 04/13/21   Evans Lance, MD  escitalopram (LEXAPRO) 20 MG tablet Take 20 mg by mouth in the morning. 10/23/20   [provider]  fluticasone furoate-vilanterol (BREO ELLIPTA) 200-25 MCG/INH AEPB Inhale 1 puff by mouth once daily 06/11/20   Mannam, Praveen, MD  furosemide (LASIX) 40 MG tablet TAKE 1 TO 2 TABLETS BY MOUTH AS DIRECTED ONCE DAILY FOR  EDEMA,  SHORTNESS  OF  BREATH  OR  WEIGHT  GAIN 11/25/21   Evans Lance, MD  gabapentin (NEURONTIN) 100 MG capsule Take 1 capsule (100 mg total) by mouth 3 (three) times daily as needed. Patient not taking: Reported on 09/03/2021 11/05/20   Melvenia Beam, MD  ipratropium-albuterol (DUONEB) 0.5-2.5 (3) MG/3ML SOLN Take 3 mLs by nebulization every 4 (four) hours as needed. 10/09/17   Lauraine Rinne, NP  ketoconazole (NIZORAL) 2 % cream Apply 1 application. topically 2 (two) times daily. daily Patient taking differently: Apply 1 application. topically at bedtime as needed (raw area(s)--skin folds.). 08/12/21 12/10/21  Sheffield, Vida Roller R, PA-C  lidocaine (LIDODERM) 5 % Place 1 patch onto the skin daily. Remove & Discard patch within 12 hours or as directed by MD Patient not taking: Reported on 09/03/2021 06/16/21   Regan Lemming, MD  lidocaine 4 % Place 1 patch onto the skin daily as needed (knee pain.).    [provider]  Multiple Vitamin (MULTIVITAMIN WITH MINERALS) TABS tablet Take 1 tablet by mouth in the morning.    [provider]  pantoprazole (PROTONIX) 40 MG tablet Take 40  mg by mouth daily before breakfast. 08/29/17   [provider]  rosuvastatin (CRESTOR) 5 MG tablet Take 5 mg by mouth at bedtime. 11/18/20   [provider]  sacubitril-valsartan (ENTRESTO) 24-26 MG Take 1 tablet by mouth 2 (two) times daily. 04/09/21   Evans Lance, MD  spironolactone (ALDACTONE) 25 MG tablet Take 1 tablet (25 mg total) by mouth daily. 08/19/21   Jettie Booze, MD      Allergies    Ivp dye [iodinated contrast media], Penicillins, Sulfonamide derivatives, Tessalon [benzonatate], Sulfa antibiotics, Rosuvastatin calcium, and Onion    Review of Systems   Review of Systems  Cardiovascular:  Positive for syncope.    Physical Exam Updated Vital  Signs BP 122/63 (BP Location: Right Arm)   Pulse 60   Temp 97.9 F (36.6 C) (Oral)   Resp 18   SpO2 98%  Physical Exam Vitals and nursing note reviewed.  Constitutional:      General: She is not in acute distress.    Appearance: She is well-developed.  HENT:     Head: Normocephalic and atraumatic.     Nose: Nose normal.     Mouth/Throat:     Mouth: Mucous membranes are moist.  Eyes:     Pupils: Pupils are equal, round, and reactive to light.  Cardiovascular:     Rate and Rhythm: Normal rate and regular rhythm.     Pulses: Normal pulses.     Heart sounds: Normal heart sounds. No murmur heard.    No friction rub.  Pulmonary:     Effort: Pulmonary effort is normal.     Breath sounds: Normal breath sounds. No wheezing or rales.  Abdominal:     General: Bowel sounds are normal. There is no distension.     Palpations: Abdomen is soft.     Tenderness: There is no abdominal tenderness. There is no guarding or rebound.  Musculoskeletal:        General: No tenderness. Normal range of motion.     Cervical back: Normal range of motion and neck supple.     Right lower leg: No edema.     Left lower leg: No edema.     Comments: No edema  Skin:    General: Skin is warm and dry.     Findings: No rash.   Neurological:     Mental Status: She is alert and oriented to person, place, and time. Mental status is at baseline.     Cranial Nerves: No cranial nerve deficit.  Psychiatric:        Mood and Affect: Mood normal.        Behavior: Behavior normal.     ED Results / Procedures / Treatments   Labs (all labs ordered are listed, but only abnormal results are displayed) Labs Reviewed  CBC WITH DIFFERENTIAL/PLATELET - Abnormal; Notable for the following components:      Result Value   WBC 13.9 (*)    Neutro Abs 11.1 (*)    Abs Immature Granulocytes 0.11 (*)    All other components within normal limits  COMPREHENSIVE METABOLIC PANEL - Abnormal; Notable for the following components:   Glucose, Bld 121 (*)    BUN 42 (*)    Creatinine, Ser 1.02 (*)    GFR, Estimated 57 (*)    All other components within normal limits  BRAIN NATRIURETIC PEPTIDE - Abnormal; Notable for the following components:   B Natriuretic Peptide 147.0 (*)    All other components within normal limits  URINALYSIS, ROUTINE W REFLEX MICROSCOPIC - Abnormal; Notable for the following components:   Color, Urine STRAW (*)    Glucose, UA >=500 (*)    Hgb urine dipstick SMALL (*)    Leukocytes,Ua LARGE (*)    Bacteria, UA RARE (*)    All other components within normal limits  CBG MONITORING, ED - Abnormal; Notable for the following components:   Glucose-Capillary 125 (*)    All other components within normal limits  TROPONIN I (HIGH SENSITIVITY)  TROPONIN I (HIGH SENSITIVITY)    EKG EKG Interpretation  Date/Time:  Thursday November 25 2021 14:06:34 EDT Ventricular Rate:  66 PR Interval:  130 QRS Duration: 142 QT Interval:  462 QTC Calculation: 484 R Axis:   -17 Text Interpretation: Normal sinus rhythm Left bundle branch block Abnormal ECG When compared with ECG of 16-Jun-2021 10:20, PREVIOUS ECG IS PRESENT No acute changes No significant change since last tracing Confirmed by Varney Biles [phone]) on 11/25/2021  2:18:06 PM  Radiology DG Chest 2 View  Result Date: 11/25/2021 CLINICAL DATA:  Syncope, history hypertension, non ischemic cardiomyopathy, GERD, asthma, arrhythmia EXAM: CHEST - 2 VIEW COMPARISON:  06/16/2021 FINDINGS: LEFT subclavian ICD with leads projecting at RIGHT atrium and RIGHT ventricle unchanged. Mild enlargement of cardiac silhouette. Mediastinal contours and pulmonary vascularity normal. Lungs clear. No pulmonary infiltrate, pleural effusion, or pneumothorax. Bones demineralized with prior spinal augmentation procedures at 3 levels of the thoracic spine. IMPRESSION: Mild enlargement of cardiac silhouette post ICD. No acute abnormalities. Electronically Signed   By: Lavonia Dana M.D.   On: 11/25/2021 15:40   CT Head Wo Contrast  Result Date: 11/25/2021 CLINICAL DATA:  Syncopal episode with fall. EXAM: CT HEAD WITHOUT CONTRAST CT CERVICAL SPINE WITHOUT CONTRAST TECHNIQUE: Multidetector CT imaging of the head and cervical spine was performed following the standard protocol without intravenous contrast. Multiplanar CT image reconstructions of the cervical spine were also generated. RADIATION DOSE REDUCTION: This exam was performed according to the departmental dose-optimization program which includes automated exposure control, adjustment of the mA and/or kV according to patient size and/or use of iterative reconstruction technique. COMPARISON:  Multiple priors including CT October 29, 2020. FINDINGS: CT HEAD FINDINGS Brain: No evidence of acute large vascular territory infarction, hemorrhage, hydrocephalus, extra-axial collection or mass lesion/mass effect. Stable dense calcification along the left tentorial leaflet. Vascular: No hyperdense vessel. Calcifications of the internal carotid arteries at the skull base. Skull: Benign hyperostosis interna. Negative for fracture or focal lesion. Unchanged asymmetric enlargement of the left jugular foramina. Sinuses/Orbits: Visualized portions of the paranasal  sinuses are predominantly clear. Orbits are grossly unremarkable. Other: Mastoid air cells are predominantly clear. CT CERVICAL SPINE FINDINGS Alignment: Straightening with mild reversal of the normal cervical lordosis which is commonly positional or reflects muscle spasm. No evidence of traumatic listhesis. Skull base and vertebrae: No acute fracture. No primary bone lesion or focal pathologic process. Soft tissues and spinal canal: No prevertebral fluid or swelling. No visible canal hematoma. Disc levels: Mild multilevel degenerative changes spine without significant neural foraminal or spinal canal narrowing. Upper chest: None Other: Partially visualized cardiac generator leads. IMPRESSION: 1. No acute intracranial abnormality. 2. No acute fracture or traumatic listhesis of the cervical spine. 3. Straightening with mild reversal of the normal cervical lordosis which is commonly positional or reflect muscle spasm. Electronically Signed   By: Dahlia Bailiff M.D.   On: 11/25/2021 15:14   CT Cervical Spine Wo Contrast  Result Date: 11/25/2021 CLINICAL DATA:  Syncopal episode with fall. EXAM: CT HEAD WITHOUT CONTRAST CT CERVICAL SPINE WITHOUT CONTRAST TECHNIQUE: Multidetector CT imaging of the head and cervical spine was performed following the standard protocol without intravenous contrast. Multiplanar CT image reconstructions of the cervical spine were also generated. RADIATION DOSE REDUCTION: This exam was performed according to the departmental dose-optimization program which includes automated exposure control, adjustment of the mA and/or kV according to patient size and/or use of iterative reconstruction technique. COMPARISON:  Multiple priors including CT October 29, 2020. FINDINGS: CT HEAD FINDINGS Brain: No evidence of acute large vascular territory infarction, hemorrhage, hydrocephalus, extra-axial collection or mass lesion/mass effect. Stable dense calcification along the left tentorial leaflet. Vascular:  No hyperdense vessel.  Calcifications of the internal carotid arteries at the skull base. Skull: Benign hyperostosis interna. Negative for fracture or focal lesion. Unchanged asymmetric enlargement of the left jugular foramina. Sinuses/Orbits: Visualized portions of the paranasal sinuses are predominantly clear. Orbits are grossly unremarkable. Other: Mastoid air cells are predominantly clear. CT CERVICAL SPINE FINDINGS Alignment: Straightening with mild reversal of the normal cervical lordosis which is commonly positional or reflects muscle spasm. No evidence of traumatic listhesis. Skull base and vertebrae: No acute fracture. No primary bone lesion or focal pathologic process. Soft tissues and spinal canal: No prevertebral fluid or swelling. No visible canal hematoma. Disc levels: Mild multilevel degenerative changes spine without significant neural foraminal or spinal canal narrowing. Upper chest: None Other: Partially visualized cardiac generator leads. IMPRESSION: 1. No acute intracranial abnormality. 2. No acute fracture or traumatic listhesis of the cervical spine. 3. Straightening with mild reversal of the normal cervical lordosis which is commonly positional or reflect muscle spasm. Electronically Signed   By: Dahlia Bailiff M.D.   On: 11/25/2021 15:14    Procedures Procedures    Medications Ordered in ED Medications  meclizine (ANTIVERT) tablet 25 mg (25 mg Oral Given 11/25/21 2122)    ED Course/ Medical Decision Making/ A&P                           Medical Decision Making Amount and/or Complexity of Data Reviewed Independent Historian: EMS    Details: paramedics External Data Reviewed: notes. Labs: ordered. Decision-making details documented in ED Course. Radiology: ordered and independent interpretation performed. Decision-making details documented in ED Course. ECG/medicine tests: ordered and independent interpretation performed. Decision-making details documented in ED  Course.  Risk Prescription drug management.   Pt with multiple medical problems and comorbidities and presenting today with a complaint that caries a high risk for morbidity and mortality.  Here today after an episode of syncope at her home.  Patient has numerous medical problems that could lead to syncope including congestive heart failure with an EF of 25 to 30% as well as WPW.  Patient does have a defibrillator and is not aware that it went off.  She has no prior history of seizures or acute intracranial abnormalities.  She is awake and alert.  At this time low suspicion that patient had a seizure as she woke up on the floor and was immediately able to think clearly call her doctor and get something to eat.  Concern for possible orthostatic hypotension as symptoms do seem to be triggered by getting up but she has been eating and drinking and does not have reason for dehydration.  She has lost some weight and continues to take blood pressure medications which could be a factor.  Also concern for AKI today.  No findings to suggest CHF.  She denies any shortness of breath at this time.  Based on epic notes she did speak with Dr. Tanna Furry office today and they ran a strip on her device that showed no evidence of dysrhythmia or events today around the time of her syncope.  She is not having any infectious symptoms at this time.  I independently interpreted patient's EKG and labs.  EKG shows a left bundle branch block which is unchanged, labs with mild leukocytosis of 13.9 today with normal hemoglobin and platelet count, CMP with normal sodium and potassium levels, creatinine is mildly elevated today 1.02 from her baseline of 0.9 and BNP mildly elevated at 147 today which is  higher than her baseline.  Initial troponin was 11 and delta troponin is pending.  Patient's blood pressure was 118/56 with a pulse rate in the 50s.  We will do orthostatics for further evaluation. I have independently visualized and  interpreted pt's images today.  Chest x-ray today without evidence of pulmonary edema.  Patient does have an ICD.  Head CT without intracranial hemorrhage.  Radiology reports no acute findings on either image.  Cervical spine was negative for fracture.  10:47 PM Orthostatics are neg.  Pt was given fluids and something to eat however when attempting to walk her here she had to grab the wall reported she felt weak all over and had dizziness which may be a little vertiginous in nature.  No focal sx and states feels a little different than it did today in her kitchen.  Uncertain if pt has a bit of concussion after the syncopal event causing vertigo.  She does not have sx while in bed.  Will try meclizine but if still symptomatic pt will need admission as she lives alone and is having difficulty getting around.  10:47 PM On reevaluation after meclizine patient now feels much better.  She was able to ambulate and reported no longer feeling dizzy.  Suspect patient had some mild vertigo after hitting her head during her syncopal event today.  She feels comfortable going home at this time.  She does have a walker at home that she can have available just in case.  She was sent home with a short prescription for meclizine and given return precautions.  No social determinants affecting discharge today.          Final Clinical Impression(s) / ED Diagnoses Final diagnoses:  Syncope and collapse  Vertigo  Concussion with unknown loss of consciousness status, initial encounter    Rx / DC Orders ED Discharge Orders          Ordered    meclizine (ANTIVERT) 25 MG tablet  3 times daily PRN        11/25/21 2247              Blanchie Dessert, MD 11/25/21 2247

## 2021-11-25 NOTE — Telephone Encounter (Signed)
Normal device function. No episodes. Patient called and confirmed she has positive loc this am. Patient advised to call 911 and go to the emergency department. Patient advised DO NOT DRIVE. Patient voiced understanding.

## 2021-11-25 NOTE — Telephone Encounter (Signed)
Patient called in stating she passed out this morning in the kitchen. Patient states she is dizzy and has some sob. Patient states she has not felt like this in a while. Patient is sending a transmission and would like a call back from the nurse

## 2021-11-25 NOTE — ED Provider Triage Note (Signed)
Emergency Medicine Provider Triage Evaluation Note  Paula Booker , a 76 y.o. female  was evaluated in triage.  Pt complains of syncopal episode while she was no prepping.  She reports that she stood up and took a few steps and started to feel lightheaded and passed out.  She thinks she may have hit her head.  She denies any other pain but reports that she still feels woozy.  Denies any chest pain or any shortness of breath.  Patient does have a pacemaker and defibrillator..  Review of Systems  Positive:  Negative:   Physical Exam  BP 118/60 (BP Location: Right Arm)   Pulse 69   Temp 98.4 F (36.9 C) (Oral)   Resp 18   SpO2 94%  Gen:   Awake, no distress   Resp:  Normal effort  MSK:   Moves extremities without difficulty  Other:  No scalp or midline cervical, thoracic, or lumbar tenderness.  No obvious deformities.  Cranial nerves grossly intact.   Medical Decision Making  Medically screening exam initiated at 2:12 PM.  Appropriate orders placed.  Paula Booker was informed that the remainder of the evaluation will be completed by another provider, this initial triage assessment does not replace that evaluation, and the importance of remaining in the ED until their evaluation is complete.  On blood thinners, thinks she may have hit her head.  Denies any other pain.  She reports that she still feels woozy. Labs and imaging ordered   Sherrell Puller, [place] 11/25/21 1413

## 2021-11-25 NOTE — ED Notes (Addendum)
Patient ambulated to restroom by NT, reports having to return back to ED room on a wheelchair due to feeling extremely weak and dizzy. Patient continues to complain of a headache.    Per NT patient had an unsteady gait during ambulation, got very pale, and lightheaded.

## 2021-11-25 NOTE — Discharge Instructions (Signed)
All the lab work today was reassuring and the CAT scan of your head was normal.  Suspect you do have a mild concussion for passing out and hitting your head.  Passing out may have been related to your blood pressure dropping today.  There is no sign of abnormal rhythms with your heart based on Dr. Tanna Furry transmission.  If you have any further passing out spells or you start having recurrent vomiting, confusion or inability to walk you should return to the emergency room immediately.

## 2021-11-25 NOTE — ED Notes (Signed)
ED Provider at bedside. 

## 2021-11-25 NOTE — ED Triage Notes (Signed)
Patient was meal prepping at home and had a syncopal episode. Was able to get herself up right away. Denies any injury. VSS. IV placed via EMS.

## 2021-11-25 NOTE — ED Notes (Signed)
Patient ambulated down the hw after Meclizine administered, patient had a steady gait, tolerated better. Denies feeling lightheaded/dizziness

## 2021-11-30 DIAGNOSIS — M47816 Spondylosis without myelopathy or radiculopathy, lumbar region: Secondary | ICD-10-CM | POA: Diagnosis not present

## 2021-12-03 ENCOUNTER — Ambulatory Visit (INDEPENDENT_AMBULATORY_CARE_PROVIDER_SITE_OTHER): Payer: Medicare HMO

## 2021-12-03 DIAGNOSIS — I5022 Chronic systolic (congestive) heart failure: Secondary | ICD-10-CM

## 2021-12-03 LAB — CUP PACEART REMOTE DEVICE CHECK
Battery Remaining Longevity: 5 mo
Battery Voltage: 2.83 V
Brady Statistic AP VP Percent: 0.03 %
Brady Statistic AP VS Percent: 21.81 %
Brady Statistic AS VP Percent: 0.02 %
Brady Statistic AS VS Percent: 78.13 %
Brady Statistic RA Percent Paced: 21.71 %
Brady Statistic RV Percent Paced: 0.05 %
Date Time Interrogation Session: 20230825043827
HighPow Impedance: 93 Ohm
Implantable Lead Implant Date: 20131212
Implantable Lead Implant Date: 20131212
Implantable Lead Location: 753859
Implantable Lead Location: 753860
Implantable Lead Model: 5076
Implantable Lead Model: 6935
Implantable Pulse Generator Implant Date: 20131212
Lead Channel Impedance Value: 456 Ohm
Lead Channel Impedance Value: 608 Ohm
Lead Channel Impedance Value: 760 Ohm
Lead Channel Pacing Threshold Amplitude: 0.25 V
Lead Channel Pacing Threshold Amplitude: 0.625 V
Lead Channel Pacing Threshold Pulse Width: 0.4 ms
Lead Channel Pacing Threshold Pulse Width: 0.4 ms
Lead Channel Sensing Intrinsic Amplitude: 3.375 mV
Lead Channel Sensing Intrinsic Amplitude: 3.375 mV
Lead Channel Sensing Intrinsic Amplitude: 7.875 mV
Lead Channel Sensing Intrinsic Amplitude: 7.875 mV
Lead Channel Setting Pacing Amplitude: 2 V
Lead Channel Setting Pacing Amplitude: 2.5 V
Lead Channel Setting Pacing Pulse Width: 0.4 ms
Lead Channel Setting Sensing Sensitivity: 0.3 mV

## 2021-12-14 ENCOUNTER — Ambulatory Visit (INDEPENDENT_AMBULATORY_CARE_PROVIDER_SITE_OTHER): Payer: Medicare HMO

## 2021-12-14 DIAGNOSIS — I5022 Chronic systolic (congestive) heart failure: Secondary | ICD-10-CM

## 2021-12-14 DIAGNOSIS — Z95810 Presence of automatic (implantable) cardiac defibrillator: Secondary | ICD-10-CM

## 2021-12-16 DIAGNOSIS — R6889 Other general symptoms and signs: Secondary | ICD-10-CM | POA: Diagnosis not present

## 2021-12-16 DIAGNOSIS — M47816 Spondylosis without myelopathy or radiculopathy, lumbar region: Secondary | ICD-10-CM | POA: Diagnosis not present

## 2021-12-17 DIAGNOSIS — R55 Syncope and collapse: Secondary | ICD-10-CM | POA: Diagnosis not present

## 2021-12-17 DIAGNOSIS — F32A Depression, unspecified: Secondary | ICD-10-CM | POA: Diagnosis not present

## 2021-12-17 NOTE — Progress Notes (Signed)
EPIC Encounter for ICM Monitoring  Patient Name: Paula Booker is a 76 y.o. female Date: 12/17/2021 Primary Care Physican: [name], MD Primary Cardiologist: Irish Lack Electrophysiologist: Lovena Le 08/19/2021 Office Weight: 201 lbs 11/10/2021 Weight: 191 lbs 12/17/2021 Weight: 189 lbs   Time in AT/AF  0.0 hr/day (0.0%)   Battery Longevity:  5 months         Spoke with patient and heart failure questions reviewed.  Pt has had ear infection for patient for past 3 days.   ED Visit 8/17 for Syncope   Optivol thoracic impedance suggesting normal fluid levels.   Prescribed:  Furosemide 40 mg take 1-2 tablets (40 mg - 80 mg total) by mouth as directed once daily for edema, SOB or weight gain. Spironolactone 25 mg take 1 tablet by mouth daily   Labs: 11/25/2021 Creatinine 1.02, BUN 42, Potassium 4.2, Sodium 138, GFR 57 06/16/2021 Creatinine 0.90, BUN 16, Potassium 4.2, Sodium 138, GFR >60 05/13/2021 Creatinine 0.95, BUN 9,   Potassium 4.3, Sodium 140, GFR 62 A complete set of results can be found in Results Review.   Recommendations:  No changes and encouraged to call if experiencing any fluid symptoms.   Follow-up plan: ICM clinic phone appointment on 01/24/2022.   91 day device clinic remote transmission 01/04/2022.     EP/Cardiology Office Visits:  Recall 03/13/2022 with Dr Lovena Le.  Recall 08/14/2022 with Dr Irish Lack.     Copy of ICM check sent to Dr. Lovena Le.    3 month ICM trend: 12/14/2021.    12-14 Month ICM trend:     [name], RN 12/17/2021 7:52 AM

## 2021-12-27 DIAGNOSIS — R7309 Other abnormal glucose: Secondary | ICD-10-CM | POA: Diagnosis not present

## 2021-12-27 DIAGNOSIS — E782 Mixed hyperlipidemia: Secondary | ICD-10-CM | POA: Diagnosis not present

## 2021-12-27 NOTE — Progress Notes (Signed)
Remote ICD transmission.   

## 2021-12-28 DIAGNOSIS — E669 Obesity, unspecified: Secondary | ICD-10-CM | POA: Diagnosis not present

## 2021-12-28 DIAGNOSIS — K7460 Unspecified cirrhosis of liver: Secondary | ICD-10-CM | POA: Diagnosis not present

## 2021-12-28 DIAGNOSIS — Z6834 Body mass index (BMI) 34.0-34.9, adult: Secondary | ICD-10-CM | POA: Diagnosis not present

## 2021-12-28 DIAGNOSIS — G4733 Obstructive sleep apnea (adult) (pediatric): Secondary | ICD-10-CM | POA: Diagnosis not present

## 2021-12-28 DIAGNOSIS — E782 Mixed hyperlipidemia: Secondary | ICD-10-CM | POA: Diagnosis not present

## 2021-12-28 DIAGNOSIS — I10 Essential (primary) hypertension: Secondary | ICD-10-CM | POA: Diagnosis not present

## 2022-01-04 ENCOUNTER — Ambulatory Visit (INDEPENDENT_AMBULATORY_CARE_PROVIDER_SITE_OTHER): Payer: Medicare HMO

## 2022-01-04 DIAGNOSIS — I428 Other cardiomyopathies: Secondary | ICD-10-CM | POA: Diagnosis not present

## 2022-01-05 LAB — CUP PACEART REMOTE DEVICE CHECK
Battery Remaining Longevity: 4 mo
Battery Voltage: 2.82 V
Brady Statistic AP VP Percent: 0.03 %
Brady Statistic AP VS Percent: 30.61 %
Brady Statistic AS VP Percent: 0.02 %
Brady Statistic AS VS Percent: 69.34 %
Brady Statistic RA Percent Paced: 30.16 %
Brady Statistic RV Percent Paced: 0.05 %
Date Time Interrogation Session: 20230926001606
HighPow Impedance: 95 Ohm
Implantable Lead Implant Date: 20131212
Implantable Lead Implant Date: 20131212
Implantable Lead Location: 753859
Implantable Lead Location: 753860
Implantable Lead Model: 5076
Implantable Lead Model: 6935
Implantable Pulse Generator Implant Date: 20131212
Lead Channel Impedance Value: 513 Ohm
Lead Channel Impedance Value: 589 Ohm
Lead Channel Impedance Value: 760 Ohm
Lead Channel Pacing Threshold Amplitude: 0.25 V
Lead Channel Pacing Threshold Amplitude: 0.625 V
Lead Channel Pacing Threshold Pulse Width: 0.4 ms
Lead Channel Pacing Threshold Pulse Width: 0.4 ms
Lead Channel Sensing Intrinsic Amplitude: 3.75 mV
Lead Channel Sensing Intrinsic Amplitude: 3.75 mV
Lead Channel Sensing Intrinsic Amplitude: 7 mV
Lead Channel Sensing Intrinsic Amplitude: 7 mV
Lead Channel Setting Pacing Amplitude: 2 V
Lead Channel Setting Pacing Amplitude: 2.5 V
Lead Channel Setting Pacing Pulse Width: 0.4 ms
Lead Channel Setting Sensing Sensitivity: 0.3 mV

## 2022-01-13 NOTE — Progress Notes (Signed)
Remote ICD transmission.   

## 2022-02-04 ENCOUNTER — Ambulatory Visit (INDEPENDENT_AMBULATORY_CARE_PROVIDER_SITE_OTHER): Payer: Self-pay

## 2022-02-04 DIAGNOSIS — I428 Other cardiomyopathies: Secondary | ICD-10-CM

## 2022-02-04 LAB — CUP PACEART REMOTE DEVICE CHECK
Battery Remaining Longevity: 1 mo
Battery Voltage: 2.8 V
Brady Statistic AP VP Percent: 0.01 %
Brady Statistic AP VS Percent: 4.49 %
Brady Statistic AS VP Percent: 0.02 %
Brady Statistic AS VS Percent: 95.48 %
Brady Statistic RA Percent Paced: 4.46 %
Brady Statistic RV Percent Paced: 0.04 %
Date Time Interrogation Session: 20231027142344
HighPow Impedance: 93 Ohm
Implantable Lead Connection Status: 753985
Implantable Lead Connection Status: 753985
Implantable Lead Implant Date: 20131212
Implantable Lead Implant Date: 20131212
Implantable Lead Location: 753859
Implantable Lead Location: 753860
Implantable Lead Model: 5076
Implantable Lead Model: 6935
Implantable Pulse Generator Implant Date: 20131212
Lead Channel Impedance Value: 475 Ohm
Lead Channel Impedance Value: 646 Ohm
Lead Channel Impedance Value: 836 Ohm
Lead Channel Pacing Threshold Amplitude: 0.375 V
Lead Channel Pacing Threshold Amplitude: 0.75 V
Lead Channel Pacing Threshold Pulse Width: 0.4 ms
Lead Channel Pacing Threshold Pulse Width: 0.4 ms
Lead Channel Sensing Intrinsic Amplitude: 3.625 mV
Lead Channel Sensing Intrinsic Amplitude: 3.625 mV
Lead Channel Sensing Intrinsic Amplitude: 9.75 mV
Lead Channel Sensing Intrinsic Amplitude: 9.75 mV
Lead Channel Setting Pacing Amplitude: 2 V
Lead Channel Setting Pacing Amplitude: 2.5 V
Lead Channel Setting Pacing Pulse Width: 0.4 ms
Lead Channel Setting Sensing Sensitivity: 0.3 mV
Zone Setting Status: 755011
Zone Setting Status: 755011

## 2022-02-07 ENCOUNTER — Ambulatory Visit (INDEPENDENT_AMBULATORY_CARE_PROVIDER_SITE_OTHER): Payer: Medicare HMO

## 2022-02-07 DIAGNOSIS — Z95810 Presence of automatic (implantable) cardiac defibrillator: Secondary | ICD-10-CM

## 2022-02-07 DIAGNOSIS — I5022 Chronic systolic (congestive) heart failure: Secondary | ICD-10-CM

## 2022-02-08 NOTE — Progress Notes (Addendum)
EPIC Encounter for ICM Monitoring  Patient Name: Paula Booker is a 76 y.o. female Date: 02/08/2022 Primary Care Physican: [name], MD Primary Cardiologist: Irish Lack Electrophysiologist: Lovena Le 08/19/2021 Office Weight: 201 lbs 11/10/2021 Weight: 191 lbs 12/17/2021 Weight: 189 lbs 02/08/2022 Weight: 183 lbs   Time in AT/AF  0.0 hr/day (0.0%)   Battery Longevity:  1 months         Spoke with patient and heart failure questions reviewed.  Transmission results reviewed.  Pt reports she has a cold.     Optivol thoracic impedance suggesting normal fluid levels.   Prescribed:  Furosemide 40 mg take 1-2 tablets (40 mg - 80 mg total) by mouth as directed once daily for edema, SOB or weight gain. Spironolactone 25 mg take 1 tablet by mouth daily   Labs: 11/25/2021 Creatinine 1.02, BUN 42, Potassium 4.2, Sodium 138, GFR 57 06/16/2021 Creatinine 0.90, BUN 16, Potassium 4.2, Sodium 138, GFR >60 05/13/2021 Creatinine 0.95, BUN 9,   Potassium 4.3, Sodium 140, GFR 62 A complete set of results can be found in Results Review.   Recommendations:   Recommendation to limit salt intake to 2000 mg daily and fluid intake to 64 oz daily.  Encouraged to call if experiencing any fluid symptoms.    Follow-up plan: ICM clinic phone appointment on 03/14/2022.   91 day device clinic remote transmission 03/07/2022.     EP/Cardiology Office Visits:  03/25/2022 with Dr Lovena Le.  Recall 08/14/2022 with Dr Irish Lack.     Copy of ICM check sent to Dr. Lovena Le.    3 month ICM trend: 02/07/2022.    12-14 Month ICM trend:     [name], RN 02/08/2022 7:02 AM

## 2022-02-09 NOTE — Progress Notes (Signed)
Remote ICD transmission.   

## 2022-02-10 ENCOUNTER — Ambulatory Visit: Payer: Medicare HMO | Admitting: Internal Medicine

## 2022-02-12 DIAGNOSIS — H5203 Hypermetropia, bilateral: Secondary | ICD-10-CM | POA: Diagnosis not present

## 2022-02-12 DIAGNOSIS — H524 Presbyopia: Secondary | ICD-10-CM | POA: Diagnosis not present

## 2022-02-12 DIAGNOSIS — H5213 Myopia, bilateral: Secondary | ICD-10-CM | POA: Diagnosis not present

## 2022-02-12 DIAGNOSIS — H52209 Unspecified astigmatism, unspecified eye: Secondary | ICD-10-CM | POA: Diagnosis not present

## 2022-02-28 DIAGNOSIS — M1712 Unilateral primary osteoarthritis, left knee: Secondary | ICD-10-CM | POA: Diagnosis not present

## 2022-02-28 DIAGNOSIS — M170 Bilateral primary osteoarthritis of knee: Secondary | ICD-10-CM | POA: Diagnosis not present

## 2022-02-28 DIAGNOSIS — M25562 Pain in left knee: Secondary | ICD-10-CM | POA: Diagnosis not present

## 2022-02-28 DIAGNOSIS — M25561 Pain in right knee: Secondary | ICD-10-CM | POA: Diagnosis not present

## 2022-02-28 DIAGNOSIS — M1711 Unilateral primary osteoarthritis, right knee: Secondary | ICD-10-CM | POA: Diagnosis not present

## 2022-03-07 ENCOUNTER — Ambulatory Visit (INDEPENDENT_AMBULATORY_CARE_PROVIDER_SITE_OTHER): Payer: Medicare HMO

## 2022-03-07 DIAGNOSIS — I5022 Chronic systolic (congestive) heart failure: Secondary | ICD-10-CM

## 2022-03-07 LAB — CUP PACEART REMOTE DEVICE CHECK
Battery Remaining Longevity: 2 mo
Battery Voltage: 2.8 V
Brady Statistic AP VP Percent: 0.01 %
Brady Statistic AP VS Percent: 8.63 %
Brady Statistic AS VP Percent: 0.03 %
Brady Statistic AS VS Percent: 91.33 %
Brady Statistic RA Percent Paced: 8.56 %
Brady Statistic RV Percent Paced: 0.04 %
Date Time Interrogation Session: 20231127143844
HighPow Impedance: 89 Ohm
Implantable Lead Connection Status: 753985
Implantable Lead Connection Status: 753985
Implantable Lead Implant Date: 20131212
Implantable Lead Implant Date: 20131212
Implantable Lead Location: 753859
Implantable Lead Location: 753860
Implantable Lead Model: 5076
Implantable Lead Model: 6935
Implantable Pulse Generator Implant Date: 20131212
Lead Channel Impedance Value: 475 Ohm
Lead Channel Impedance Value: 551 Ohm
Lead Channel Impedance Value: 722 Ohm
Lead Channel Pacing Threshold Amplitude: 0.375 V
Lead Channel Pacing Threshold Amplitude: 0.625 V
Lead Channel Pacing Threshold Pulse Width: 0.4 ms
Lead Channel Pacing Threshold Pulse Width: 0.4 ms
Lead Channel Sensing Intrinsic Amplitude: 3.25 mV
Lead Channel Sensing Intrinsic Amplitude: 3.25 mV
Lead Channel Sensing Intrinsic Amplitude: 8.25 mV
Lead Channel Sensing Intrinsic Amplitude: 8.25 mV
Lead Channel Setting Pacing Amplitude: 2 V
Lead Channel Setting Pacing Amplitude: 2.5 V
Lead Channel Setting Pacing Pulse Width: 0.4 ms
Lead Channel Setting Sensing Sensitivity: 0.3 mV
Zone Setting Status: 755011
Zone Setting Status: 755011

## 2022-03-10 ENCOUNTER — Encounter: Payer: Self-pay | Admitting: Internal Medicine

## 2022-03-10 ENCOUNTER — Ambulatory Visit: Payer: Medicare HMO | Attending: Internal Medicine | Admitting: Internal Medicine

## 2022-03-10 VITALS — BP 124/78 | HR 79 | Ht 64.0 in | Wt 185.6 lb

## 2022-03-10 DIAGNOSIS — I480 Paroxysmal atrial fibrillation: Secondary | ICD-10-CM

## 2022-03-10 DIAGNOSIS — Z95810 Presence of automatic (implantable) cardiac defibrillator: Secondary | ICD-10-CM | POA: Diagnosis not present

## 2022-03-10 DIAGNOSIS — I5022 Chronic systolic (congestive) heart failure: Secondary | ICD-10-CM

## 2022-03-10 LAB — CUP PACEART INCLINIC DEVICE CHECK
Battery Remaining Longevity: 2 mo
Battery Voltage: 2.8 V
Brady Statistic AP VP Percent: 0.01 %
Brady Statistic AP VS Percent: 7.91 %
Brady Statistic AS VP Percent: 0.03 %
Brady Statistic AS VS Percent: 92.05 %
Brady Statistic RA Percent Paced: 7.87 %
Brady Statistic RV Percent Paced: 0.04 %
Date Time Interrogation Session: 20231130104209
HighPow Impedance: 87 Ohm
Implantable Lead Connection Status: 753985
Implantable Lead Connection Status: 753985
Implantable Lead Implant Date: 20131212
Implantable Lead Implant Date: 20131212
Implantable Lead Location: 753859
Implantable Lead Location: 753860
Implantable Lead Model: 5076
Implantable Lead Model: 6935
Implantable Pulse Generator Implant Date: 20131212
Lead Channel Impedance Value: 513 Ohm
Lead Channel Impedance Value: 551 Ohm
Lead Channel Impedance Value: 722 Ohm
Lead Channel Pacing Threshold Amplitude: 0.375 V
Lead Channel Pacing Threshold Amplitude: 0.75 V
Lead Channel Pacing Threshold Pulse Width: 0.4 ms
Lead Channel Pacing Threshold Pulse Width: 0.4 ms
Lead Channel Sensing Intrinsic Amplitude: 11 mV
Lead Channel Sensing Intrinsic Amplitude: 4 mV
Lead Channel Sensing Intrinsic Amplitude: 4.25 mV
Lead Channel Sensing Intrinsic Amplitude: 7.25 mV
Lead Channel Setting Pacing Amplitude: 2 V
Lead Channel Setting Pacing Amplitude: 2.5 V
Lead Channel Setting Pacing Pulse Width: 0.4 ms
Lead Channel Setting Sensing Sensitivity: 0.3 mV
Zone Setting Status: 755011
Zone Setting Status: 755011

## 2022-03-10 NOTE — Progress Notes (Signed)
HPI Paula Booker returns today for ongoing evaluation. She has a h/o ICM, chronic systolic heart failure, syncope, and a WPW pattern on her ECG. She underwent EP study which demonstrated no inducible arrhythmias. She underwent ICD insertion. She has a h/o generalized weakness.  She has had trouble with insomnia. Her energy level has worsened.  She admits to dietary indiscretion.  She is pending surgery.     Current Outpatient Medications  Medication Sig Dispense Refill   acetaminophen (TYLENOL) 500 MG tablet Take 500 mg by mouth in the morning and at bedtime.     albuterol (VENTOLIN HFA) 108 (90 Base) MCG/ACT inhaler Inhale 2 puffs into the lungs every 6 (six) hours as needed for wheezing. For wheezing. Use 2 puffs 3 times daily x5 days and then every 6 hours as needed. 18 g 5   alclomethasone (ACLOVATE) 0.05 % cream Apply topically 2 (two) times daily as needed.     apixaban (ELIQUIS) 5 MG TABS tablet Take 1 tablet by mouth twice daily 180 tablet 1   Calcium Carb-Cholecalciferol 600-20 MG-MCG TABS Take 1 tablet by mouth in the morning.     carvedilol (COREG) 6.25 MG tablet Take 1 tablet (6.25 mg total) by mouth 2 (two) times daily. 180 tablet 3   cetirizine (ZYRTEC) 10 MG tablet Take 10 mg by mouth in the morning.     Cholecalciferol (VITAMIN D3) 5000 units CAPS Take 5,000 Units by mouth in the morning.     Collagen-Vitamin C (COLLAGEN PLUS VITAMIN C PO) Take 1 tablet by mouth in the morning.     Cyanocobalamin (VITAMIN B-12) 5000 MCG LOZG Take 5,000 mcg by mouth in the morning.     empagliflozin (JARDIANCE) 10 MG TABS tablet Take 1 tablet (10 mg total) by mouth daily before breakfast. 90 tablet 3   escitalopram (LEXAPRO) 20 MG tablet Take 20 mg by mouth in the morning.     fluticasone furoate-vilanterol (BREO ELLIPTA) 200-25 MCG/INH AEPB Inhale 1 puff by mouth once daily 60 each 5   furosemide (LASIX) 40 MG tablet TAKE 1 TO 2 TABLETS BY MOUTH AS DIRECTED ONCE DAILY FOR  EDEMA,   SHORTNESS  OF  BREATH  OR  WEIGHT  GAIN 180 tablet 1   ipratropium-albuterol (DUONEB) 0.5-2.5 (3) MG/3ML SOLN Take 3 mLs by nebulization every 4 (four) hours as needed. 360 mL 1   meclizine (ANTIVERT) 25 MG tablet Take 1 tablet (25 mg total) by mouth 3 (three) times daily as needed for dizziness. 20 tablet 0   Multiple Vitamin (MULTIVITAMIN WITH MINERALS) TABS tablet Take 1 tablet by mouth in the morning.     pantoprazole (PROTONIX) 40 MG tablet Take 40 mg by mouth daily before breakfast.  0   rosuvastatin (CRESTOR) 5 MG tablet Take 5 mg by mouth at bedtime.     sacubitril-valsartan (ENTRESTO) 24-26 MG Take 1 tablet by mouth 2 (two) times daily. 180 tablet 3   spironolactone (ALDACTONE) 25 MG tablet Take 1 tablet (25 mg total) by mouth daily. 90 tablet 3   No current facility-administered medications for this visit.     Past Medical History:  Diagnosis Date   AICD (automatic cardioverter/defibrillator) present    Anemia    as a child   Anxiety    Arthritis    "hands and knees" (03/22/2012)   Asthma    Basal cell carcinoma 04/27/2011   mid forehead(MOHS)   Bursitis    CHF (congestive heart failure) ([place]) 09/2011  pt. denies   Depression    Dysrhythmia    WPW   GERD (gastroesophageal reflux disease)    Head injury, acute, with loss of consciousness ([place])    History of hiatal hernia    Hypertension    ICD (implantable cardiac defibrillator) in place 03/2012   Migraines    migraines    NICM (nonischemic cardiomyopathy) ([place])    Obesity (BMI 30-39.9)    negative sleep study 2014   OSA (obstructive sleep apnea) 05/27/2018   Mild with AHI 6/hr    no cpap   Pneumonia    hx   Shortness of breath    "@ any time I can get SOB" (03/22/2012)   Skin cancer 1999   Squamous cell carcinoma of skin 07/08/2020   in situ right malar cheek-[id number]   Sudden arrhythmia death syndrome    WPW (Wolff-Parkinson-White syndrome)     ROS:   All systems reviewed and negative except as noted  in the HPI.   Past Surgical History:  Procedure Laterality Date   BIOPSY  09/07/2021   Procedure: BIOPSY;  Surgeon: Otis Brace, MD;  Location: [facility];  Service: Gastroenterology;;   CARDIAC CATHETERIZATION  09/18/11   no significant CAD   CARDIAC DEFIBRILLATOR PLACEMENT  03/22/2012   DDD   CHOLECYSTECTOMY  ~ 2003   ESOPHAGOGASTRODUODENOSCOPY (EGD) WITH PROPOFOL N/A 09/07/2021   Procedure: ESOPHAGOGASTRODUODENOSCOPY (EGD) WITH PROPOFOL;  Surgeon: Otis Brace, MD;  Location: [facility];  Service: Gastroenterology;  Laterality: N/A;   IMPLANTABLE CARDIOVERTER DEFIBRILLATOR IMPLANT N/A 03/22/2012   Procedure: IMPLANTABLE CARDIOVERTER DEFIBRILLATOR IMPLANT;  Surgeon: Evans Lance, MD;  Location: [facility] LAB;  Service: Cardiovascular;  Laterality: N/A;   KNEE ARTHROSCOPY Left 11/29/2017   Procedure: ARTHROSCOPY LEFT KNEE;  Surgeon: Dorna Leitz, MD;  Location: [facility];  Service: Orthopedics;  Laterality: Left;   KNEE ARTHROSCOPY Right 08/09/2019   Procedure: ARTHROSCOPY RIGHT KNEE, CHONDROPLASTY,  PARTIAL MEDIAL  MENSICECTOMY;  Surgeon: Dorna Leitz, MD;  Location: [facility];  Service: Orthopedics;  Laterality: Right;   KYPHOPLASTY N/A 11/27/2013   Procedure: KYPHOPLASTY;  Surgeon: Sinclair Ship, MD;  Location: [place];  Service: Orthopedics;  Laterality: N/A;  T9, T11 kyphoplasty   KYPHOPLASTY N/A 06/05/2014   Procedure: KYPHOPLASTY;  Surgeon: Sinclair Ship, MD;  Location: [place];  Service: Orthopedics;  Laterality: N/A;  T7 kyphoplasty   MOHS SURGERY  06/2011   "forehead" (03/22/2012)   [place]   "top of my head and my right back shoulder"   SKIN CANCER EXCISION     back   SKIN CANCER EXCISION     face   SUPRAVENTRICULAR TACHYCARDIA ABLATION  03/22/2012   unable to induce VT/RN report 03/22/2012   SUPRAVENTRICULAR TACHYCARDIA ABLATION N/A 03/22/2012   Procedure: SUPRAVENTRICULAR TACHYCARDIA ABLATION;  Surgeon: Evans Lance, MD;  Location: [facility]  CATH LAB;  Service: Cardiovascular;  Laterality: N/A;   TONSILLECTOMY AND ADENOIDECTOMY  1950   TUBAL LIGATION  1978     Family History  Problem Relation Age of Onset   Congestive Heart Failure Mother        died 75   Kidney Stones Mother    Deafness Mother    Atrial fibrillation Mother    Congestive Heart Failure Father    Deafness Father    Healthy Sister    Healthy Sister    Healthy Sister    Congestive Heart Failure Brother    Atrial fibrillation Brother    Tuberculosis  Paternal Grandfather    Migraines Neg Hx    Neural tube defect Neg Hx      Social History   Socioeconomic History   Marital status: Divorced    Spouse name: Not on file   Number of children: 3   Years of education: Not on file   Highest education level: Not on file  Occupational History   Occupation: retired    Comment: Optometrist  Tobacco Use   Smoking status: Never   Smokeless tobacco: Never  Vaping Use   Vaping Use: Never used  Substance and Sexual Activity   Alcohol use: Not Currently    Comment: rare   Drug use: No   Sexual activity: Not Currently  Other Topics Concern   Not on file  Social History Narrative   Divorced, 3 children, 9 grandchildren. Did work for [employer]. Retired at 76 y/o .   Social Determinants of Health   Financial Resource Strain: Not on file  Food Insecurity: Not on file  Transportation Needs: Not on file  Physical Activity: Not on file  Stress: Not on file  Social Connections: Not on file  Intimate Partner Violence: Not on file     BP 124/78   Pulse 79   Ht '5\' 4"'$  (1.626 m)   Wt 185 lb 9.6 oz (84.2 kg)   SpO2 97%   BMI 31.86 kg/m   Physical Exam:  Well appearing NAD HEENT: Unremarkable Neck:  No JVD, no thyromegally Lymphatics:  No adenopathy Back:  No CVA tenderness Lungs:  Clear HEART:  Regular rate rhythm, no murmurs, no rubs, no clicks Abd:  soft, positive bowel sounds, no organomegally, no rebound, no guarding Ext:  2 plus  pulses, no edema, no cyanosis, no clubbing Skin:  No rashes no nodules Neuro:  CN II through XII intact, motor grossly intact   DEVICE  Normal device function.  See PaceArt for details.   Assess/Plan: 1. Chronic systolic heart failure -her symptoms are class 2A. She has felt poorly. She will continue Entresto and her dose of her coreg to 6.25 mg bid. 2. ICD - her DDD ICD is working normally and she has just over 2 years of battery longevity. 3. Obesity - I encouraged her to lose weight.  She is down about 15 lbs. 4. HTN - her bp is well controlled. She will maintain a low sodium diet. 5. PAF - she is maintaining NSR. She will continue her systemic anti-coag.   Carleene Overlie Lyndsie Wallman,MD

## 2022-03-10 NOTE — Patient Instructions (Addendum)
Medication Instructions:  Your physician recommends that you continue on your current medications as directed. Please refer to the Current Medication list given to you today.  *If you need a refill on your cardiac medications before your next appointment, please call your pharmacy*  Lab Work: None ordered.  If you have labs (blood work) drawn today and your tests are completely normal, you will receive your results only by: [place] (if you have MyChart) OR A paper copy in the mail If you have any lab test that is abnormal or we need to change your treatment, we will call you to review the results.  Testing/Procedures: None ordered.  Follow-Up:  You are scheduled for a Medtronic ICD Generator Change with Dr. Cristopher Peru on 05/16/2021 at 930 am.    We will contact you to schedule a lab draw appointment, give you surgical scrub, and provide a letter with procedure instructions.

## 2022-03-16 NOTE — Progress Notes (Signed)
No ICM remote transmission received for 03/14/2022 and next ICM transmission scheduled for 03/28/2022.

## 2022-03-18 ENCOUNTER — Other Ambulatory Visit: Payer: Self-pay | Admitting: Internal Medicine

## 2022-03-22 ENCOUNTER — Other Ambulatory Visit: Payer: Self-pay | Admitting: Internal Medicine

## 2022-03-22 DIAGNOSIS — I480 Paroxysmal atrial fibrillation: Secondary | ICD-10-CM

## 2022-03-22 NOTE — Telephone Encounter (Signed)
Eliquis '5mg'$  refill request received. Patient is 76 years old, weight-84.2kg, Crea-1.02 on 11/25/2021, Diagnosis-Afib, and last seen by Dr. Lovena Le on 03/10/2022. Dose is appropriate based on dosing criteria. Will send in refill to requested pharmacy.

## 2022-03-25 ENCOUNTER — Encounter: Payer: Medicare HMO | Admitting: Internal Medicine

## 2022-03-25 ENCOUNTER — Telehealth: Payer: Self-pay

## 2022-03-25 DIAGNOSIS — I428 Other cardiomyopathies: Secondary | ICD-10-CM

## 2022-03-25 NOTE — Telephone Encounter (Signed)
Work up complete for procedure. Pt is aware of date/time and instructions will be sent via MyChart per pt's request.   Pt will pick up scrub when she comes in for labwork on 04/25/22.Marland KitchenMarland Kitchen

## 2022-03-31 ENCOUNTER — Other Ambulatory Visit: Payer: Self-pay | Admitting: Internal Medicine

## 2022-03-31 NOTE — Progress Notes (Signed)
No ICM remote transmission received for 03/28/2022 and next ICM transmission scheduled for 05/02/2022.   

## 2022-04-06 IMAGING — CT CT HEAD W/O CM
1 series · 16 of 30 positions shown, 20 images · non-contrast
Comparison: April 08, 2019

CLINICAL DATA: Headaches

EXAM:
CT HEAD WITHOUT CONTRAST
TECHNIQUE: Contiguous axial images were obtained from the base of the skull
through the vertex without intravenous contrast.

[Series 2: head w/(date) · axial · 0.45mm/px · z∈[-180,-36]mm · 16 of 33 slices shown, 20 images]
[im 2/33  brain]
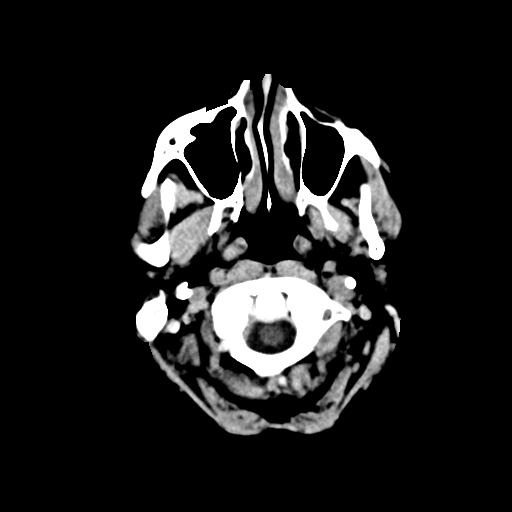
[im 2/33  bone]
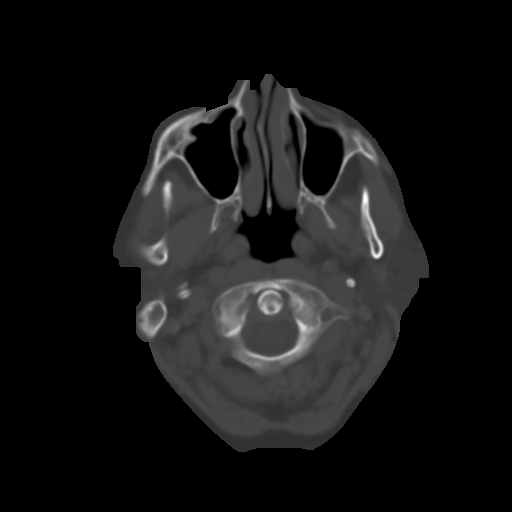
[im 4/33  brain]
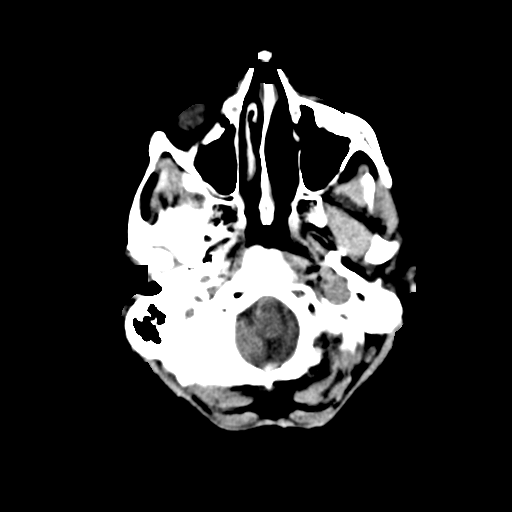
[im 6/33  brain]
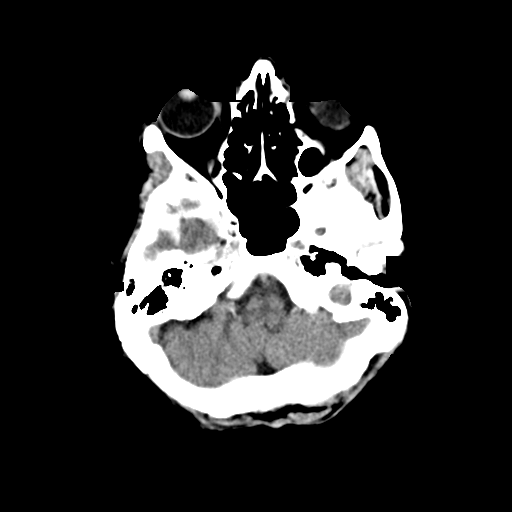
[im 8/33  brain]
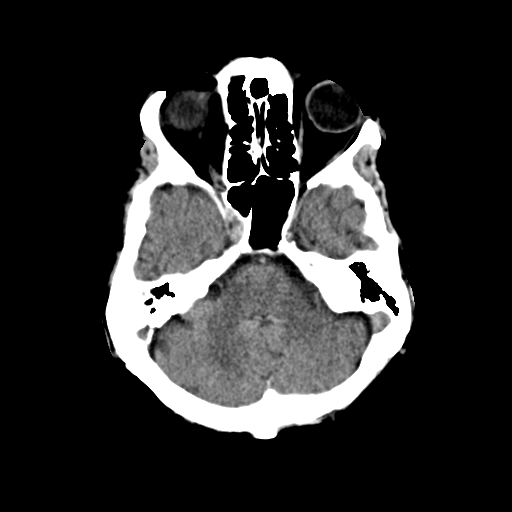
[im 9/33  brain]
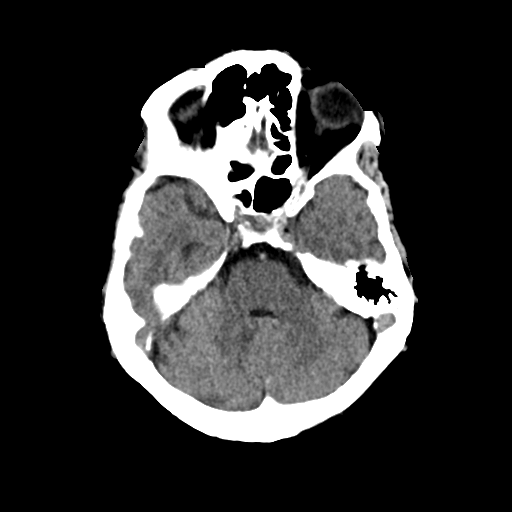
[im 9/33  bone]
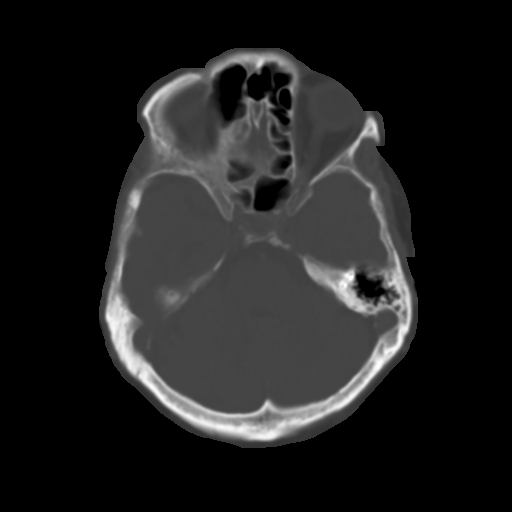
[im 12/33  brain]
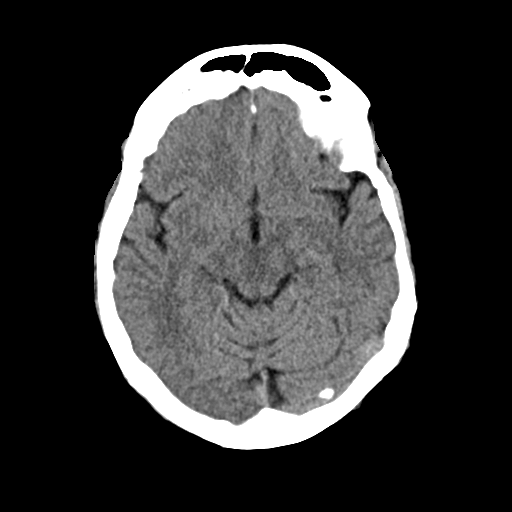
[im 14/33  brain]
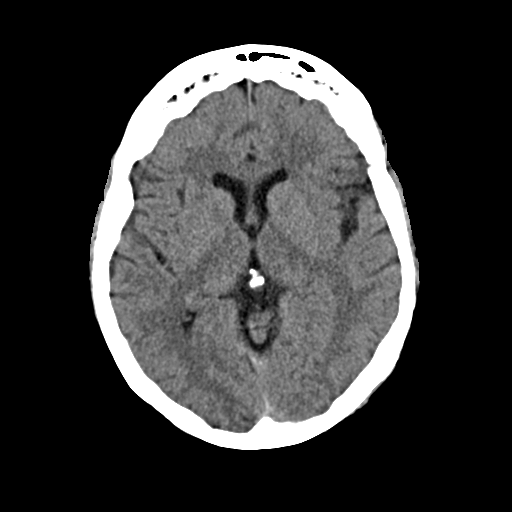
[im 16/33  brain]
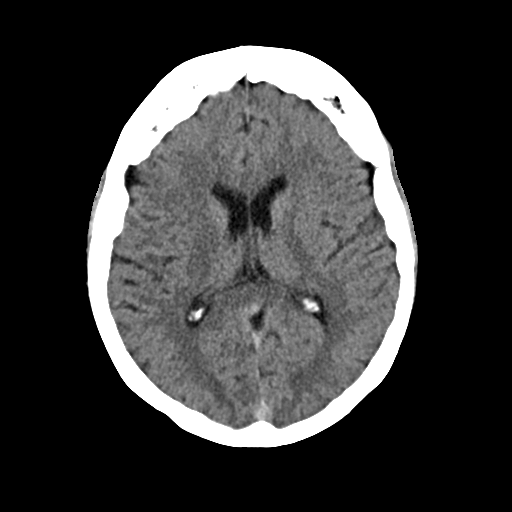
[im 17/33  brain]
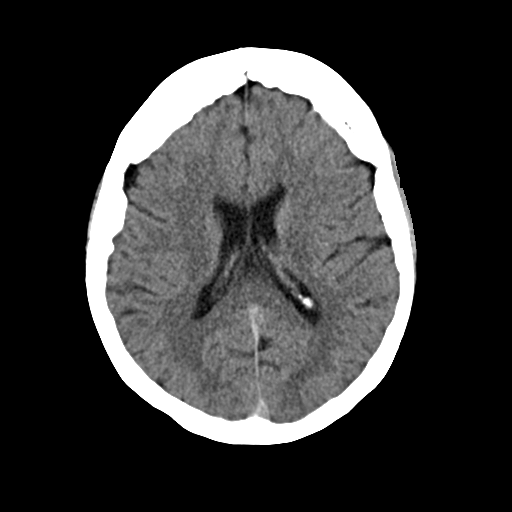
[im 17/33  bone]
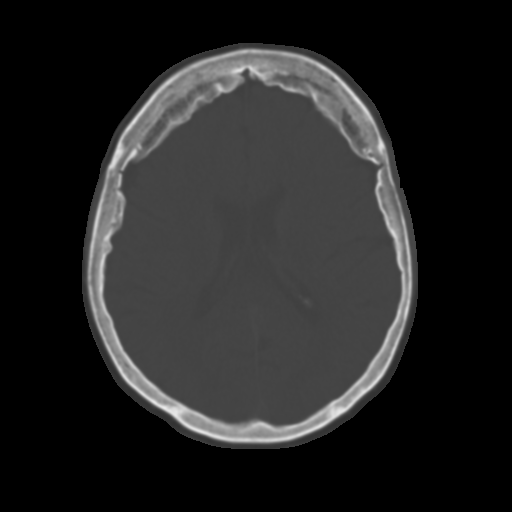
[im 19/33  brain]
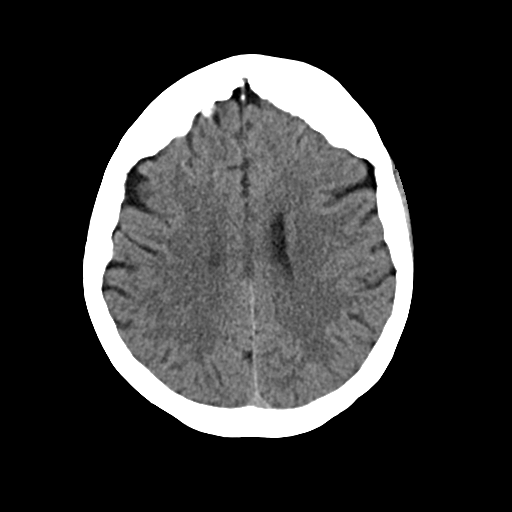
[im 21/33  brain]
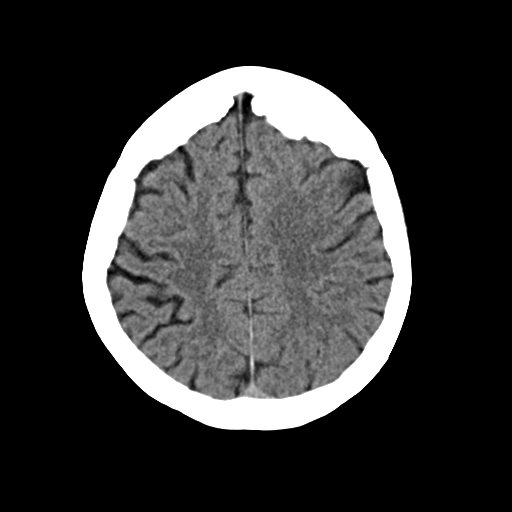
[im 24/33  brain]
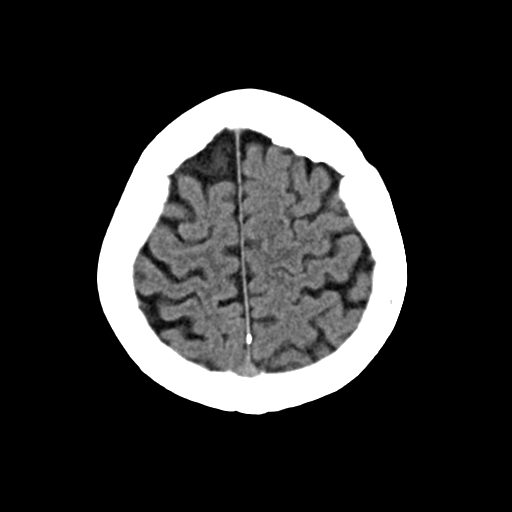
[im 25/33  brain]
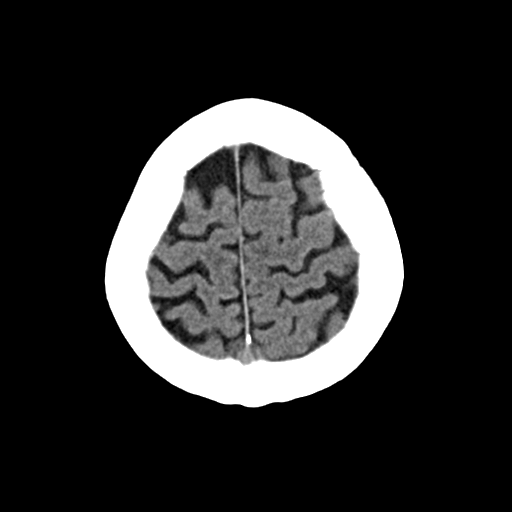
[im 25/33  bone]
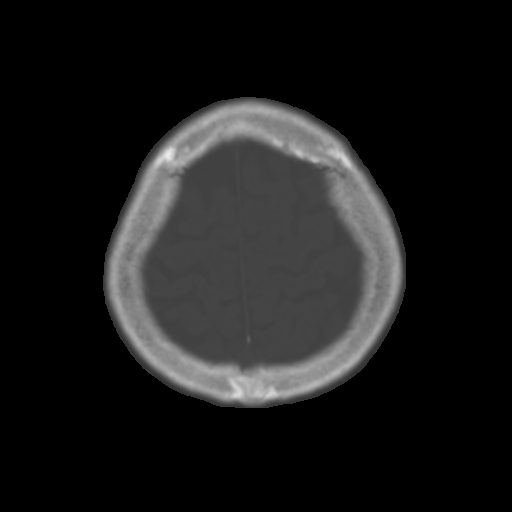
[im 27/33  brain]
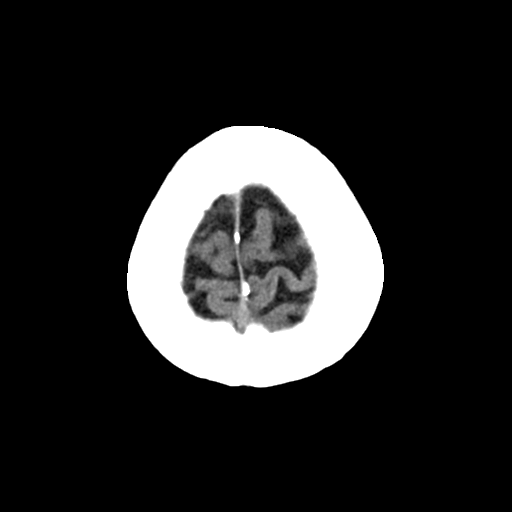
[im 29/33  brain]
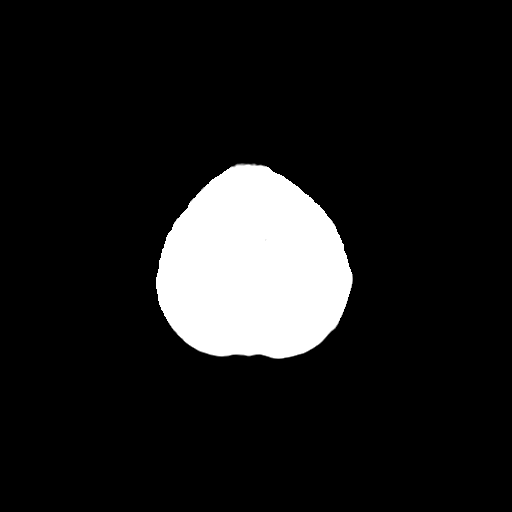
[im 31/33  brain]
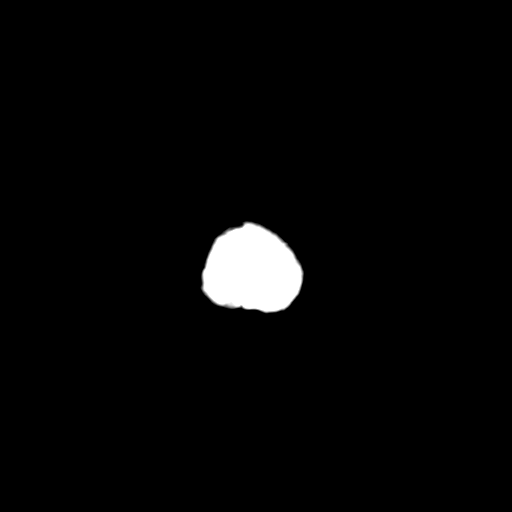

[16 of 30 positions shown; findings below may reference images not displayed]

FINDINGS: Brain: No evidence of acute infarction, hemorrhage, hydrocephalus,
extra-axial collection or mass lesion/mass effect. Remote LEFT
insular lacunar infarction versus dilated perivascular space. This
is unchanged in comparison to prior.

Vascular: No hyperdense vessel or unexpected calcification.

Skull: Normal. Negative for fracture or focal lesion.

Sinuses/Orbits: No acute finding.

Other: None.
IMPRESSION: No acute intracranial abnormality.

## 2022-04-14 ENCOUNTER — Ambulatory Visit (INDEPENDENT_AMBULATORY_CARE_PROVIDER_SITE_OTHER): Payer: Medicare HMO

## 2022-04-14 DIAGNOSIS — I428 Other cardiomyopathies: Secondary | ICD-10-CM | POA: Diagnosis not present

## 2022-04-14 LAB — CUP PACEART REMOTE DEVICE CHECK
Battery Remaining Longevity: 1 mo
Battery Voltage: 2.74 V
Brady Statistic AP VP Percent: 0 %
Brady Statistic AP VS Percent: 0.67 %
Brady Statistic AS VP Percent: 0 %
Brady Statistic AS VS Percent: 99.33 %
Brady Statistic RA Percent Paced: 0.67 %
Brady Statistic RV Percent Paced: 0 %
Date Time Interrogation Session: 20240104154447
HighPow Impedance: 93 Ohm
Implantable Lead Connection Status: 753985
Implantable Lead Connection Status: 753985
Implantable Lead Implant Date: 20131212
Implantable Lead Implant Date: 20131212
Implantable Lead Location: 753859
Implantable Lead Location: 753860
Implantable Lead Model: 5076
Implantable Lead Model: 6935
Implantable Pulse Generator Implant Date: 20131212
Lead Channel Impedance Value: 513 Ohm
Lead Channel Impedance Value: 551 Ohm
Lead Channel Impedance Value: 760 Ohm
Lead Channel Pacing Threshold Amplitude: 0.375 V
Lead Channel Pacing Threshold Amplitude: 0.75 V
Lead Channel Pacing Threshold Pulse Width: 0.4 ms
Lead Channel Pacing Threshold Pulse Width: 0.4 ms
Lead Channel Sensing Intrinsic Amplitude: 3.375 mV
Lead Channel Sensing Intrinsic Amplitude: 3.375 mV
Lead Channel Sensing Intrinsic Amplitude: 8 mV
Lead Channel Sensing Intrinsic Amplitude: 8 mV
Lead Channel Setting Pacing Amplitude: 2 V
Lead Channel Setting Pacing Amplitude: 2.5 V
Lead Channel Setting Pacing Pulse Width: 0.4 ms
Lead Channel Setting Sensing Sensitivity: 0.3 mV
Zone Setting Status: 755011
Zone Setting Status: 755011

## 2022-04-15 NOTE — Progress Notes (Signed)
Remote ICD transmission.   

## 2022-04-18 ENCOUNTER — Ambulatory Visit (INDEPENDENT_AMBULATORY_CARE_PROVIDER_SITE_OTHER): Payer: Medicare HMO

## 2022-04-18 ENCOUNTER — Other Ambulatory Visit: Payer: Self-pay | Admitting: Gastroenterology

## 2022-04-18 DIAGNOSIS — K7460 Unspecified cirrhosis of liver: Secondary | ICD-10-CM

## 2022-04-18 DIAGNOSIS — I5022 Chronic systolic (congestive) heart failure: Secondary | ICD-10-CM

## 2022-04-18 DIAGNOSIS — Z95810 Presence of automatic (implantable) cardiac defibrillator: Secondary | ICD-10-CM

## 2022-04-20 ENCOUNTER — Ambulatory Visit
Admission: RE | Admit: 2022-04-20 | Discharge: 2022-04-20 | Disposition: A | Discharge status: Home / Self Care | Payer: Medicare HMO | Source: Ambulatory Visit | Attending: Gastroenterology | Admitting: Gastroenterology

## 2022-04-20 DIAGNOSIS — K7460 Unspecified cirrhosis of liver: Secondary | ICD-10-CM

## 2022-04-20 DIAGNOSIS — Z9049 Acquired absence of other specified parts of digestive tract: Secondary | ICD-10-CM | POA: Diagnosis not present

## 2022-04-21 ENCOUNTER — Other Ambulatory Visit (HOSPITAL_COMMUNITY): Payer: Self-pay | Admitting: Gastroenterology

## 2022-04-21 DIAGNOSIS — R935 Abnormal findings on diagnostic imaging of other abdominal regions, including retroperitoneum: Secondary | ICD-10-CM

## 2022-04-21 DIAGNOSIS — R9389 Abnormal findings on diagnostic imaging of other specified body structures: Secondary | ICD-10-CM | POA: Diagnosis not present

## 2022-04-21 NOTE — Progress Notes (Signed)
EPIC Encounter for ICM Monitoring  Patient Name: Paula Booker is a 77 y.o. female Date: 04/21/2022 Primary Care Physican: [name], MD Primary Cardiologist: Irish Lack Electrophysiologist: Lovena Le 08/19/2021 Office Weight: 201 lbs 11/10/2021 Weight: 191 lbs 12/17/2021 Weight: 189 lbs 02/08/2022 Weight: 183 lbs 04/21/2022 Weight: 180 lbs   Time in AT/AF  0.0 hr/day (0.0%)         Spoke with patient and heart failure questions reviewed.  Transmission results reviewed.  Pt reports she has some swelling due to eating foods high in salt.  ICD generator change scheduled 05/16/2022.   Optivol thoracic impedance suggesting possible fluid accumulation starting 04/14/2022.   Prescribed:  Furosemide 40 mg take 1-2 tablets (40 mg - 80 mg total) by mouth as directed once daily for edema, SOB or weight gain. Spironolactone 25 mg take 1 tablet by mouth daily   Labs: 11/25/2021 Creatinine 1.02, BUN 42, Potassium 4.2, Sodium 138, GFR 57 06/16/2021 Creatinine 0.90, BUN 16, Potassium 4.2, Sodium 138, GFR >60 05/13/2021 Creatinine 0.95, BUN 9,   Potassium 4.3, Sodium 140, GFR 62 A complete set of results can be found in Results Review.   Recommendations:   She will take Furosemide 2 tablets instead 1 for the next a couple of days.     Follow-up plan: ICM clinic phone appointment on 06/27/2022.   91 day device clinic remote transmission 07/14/2022.     EP/Cardiology Office Visits:  Recall 08/14/2022 with Dr Irish Lack.     Copy of ICM check sent to Dr. Lovena Le.    3 month ICM trend: 04/18/2022.    12-14 Month ICM trend:     [name], RN 04/21/2022 2:48 PM

## 2022-04-25 ENCOUNTER — Ambulatory Visit: Payer: Medicare HMO | Attending: Internal Medicine

## 2022-04-25 DIAGNOSIS — I428 Other cardiomyopathies: Secondary | ICD-10-CM | POA: Diagnosis not present

## 2022-04-26 LAB — BASIC METABOLIC PANEL
BUN/Creatinine Ratio: 26 (ref 12–28)
BUN: 22 mg/dL (ref 8–27)
CO2: 27 mmol/L (ref 20–29)
Calcium: 9.9 mg/dL (ref 8.7–10.3)
Chloride: 103 mmol/L (ref 96–106)
Creatinine, Ser: 0.84 mg/dL (ref 0.57–1.00)
Glucose: 129 mg/dL — ABNORMAL HIGH (ref 70–99)
Potassium: 4.4 mmol/L (ref 3.5–5.2)
Sodium: 142 mmol/L (ref 134–144)
eGFR: 72 mL/min/{1.73_m2} (ref >59)

## 2022-04-26 LAB — CBC
Hematocrit: 43.2 % (ref 34.0–46.6)
Hemoglobin: 14.6 g/dL (ref 11.1–15.9)
MCH: 31.7 pg (ref 26.6–33.0)
MCHC: 33.8 g/dL (ref 31.5–35.7)
MCV: 94 fL (ref 79–97)
Platelets: 213 10*3/uL (ref 150–450)
RBC: 4.6 x10E6/uL (ref 3.77–5.28)
RDW: 12.3 % (ref 11.7–15.4)
WBC: 8.4 10*3/uL (ref 3.4–10.8)

## 2022-04-27 DIAGNOSIS — R932 Abnormal findings on diagnostic imaging of liver and biliary tract: Secondary | ICD-10-CM | POA: Diagnosis not present

## 2022-05-04 NOTE — Progress Notes (Signed)
Remote ICD transmission.   

## 2022-05-10 DIAGNOSIS — Z1231 Encounter for screening mammogram for malignant neoplasm of breast: Secondary | ICD-10-CM | POA: Diagnosis not present

## 2022-05-10 DIAGNOSIS — K219 Gastro-esophageal reflux disease without esophagitis: Secondary | ICD-10-CM | POA: Diagnosis not present

## 2022-05-10 DIAGNOSIS — Z23 Encounter for immunization: Secondary | ICD-10-CM | POA: Diagnosis not present

## 2022-05-10 DIAGNOSIS — R911 Solitary pulmonary nodule: Secondary | ICD-10-CM | POA: Diagnosis not present

## 2022-05-10 DIAGNOSIS — Z1211 Encounter for screening for malignant neoplasm of colon: Secondary | ICD-10-CM | POA: Diagnosis not present

## 2022-05-13 NOTE — Pre-Procedure Instructions (Signed)
Instructed patient on the following items: Arrival time 0730 Nothing to eat or drink after midnight No meds AM of procedure Responsible person to drive you home and stay with you for 24 hrs Wash with special soap night before and morning of procedure If on anti-coagulant drug instructions Eliquis- last dose 2/2

## 2022-05-16 ENCOUNTER — Encounter (HOSPITAL_COMMUNITY): Payer: Self-pay | Admitting: Internal Medicine

## 2022-05-16 ENCOUNTER — Other Ambulatory Visit (HOSPITAL_COMMUNITY): Payer: Medicare HMO

## 2022-05-16 ENCOUNTER — Encounter (HOSPITAL_COMMUNITY)
Admission: RE | Disposition: A | Discharge status: Home / Self Care | Payer: Self-pay | Source: Home / Self Care | Attending: Internal Medicine

## 2022-05-16 ENCOUNTER — Ambulatory Visit (HOSPITAL_COMMUNITY)
Admission: RE | Admit: 2022-05-16 | Discharge: 2022-05-16 | Disposition: A | Discharge status: Home / Self Care | Payer: Medicare HMO | Attending: Internal Medicine | Admitting: Internal Medicine

## 2022-05-16 ENCOUNTER — Ambulatory Visit: Payer: Medicare HMO

## 2022-05-16 ENCOUNTER — Other Ambulatory Visit: Payer: Self-pay

## 2022-05-16 ENCOUNTER — Ambulatory Visit (HOSPITAL_COMMUNITY): Payer: Medicare HMO

## 2022-05-16 DIAGNOSIS — Z79899 Other long term (current) drug therapy: Secondary | ICD-10-CM | POA: Diagnosis not present

## 2022-05-16 DIAGNOSIS — Z6831 Body mass index (BMI) 31.0-31.9, adult: Secondary | ICD-10-CM | POA: Insufficient documentation

## 2022-05-16 DIAGNOSIS — Z95810 Presence of automatic (implantable) cardiac defibrillator: Secondary | ICD-10-CM | POA: Diagnosis not present

## 2022-05-16 DIAGNOSIS — I110 Hypertensive heart disease with heart failure: Secondary | ICD-10-CM | POA: Diagnosis not present

## 2022-05-16 DIAGNOSIS — Z4502 Encounter for adjustment and management of automatic implantable cardiac defibrillator: Secondary | ICD-10-CM | POA: Diagnosis not present

## 2022-05-16 DIAGNOSIS — E669 Obesity, unspecified: Secondary | ICD-10-CM | POA: Diagnosis not present

## 2022-05-16 DIAGNOSIS — I255 Ischemic cardiomyopathy: Secondary | ICD-10-CM | POA: Insufficient documentation

## 2022-05-16 DIAGNOSIS — I5022 Chronic systolic (congestive) heart failure: Secondary | ICD-10-CM

## 2022-05-16 DIAGNOSIS — I480 Paroxysmal atrial fibrillation: Secondary | ICD-10-CM | POA: Diagnosis not present

## 2022-05-16 HISTORY — PX: BIV UPGRADE: EP1202

## 2022-05-16 LAB — CUP PACEART REMOTE DEVICE CHECK
Battery Remaining Longevity: 1 mo
Battery Voltage: 2.76 V
Brady Statistic AP VP Percent: 0 %
Brady Statistic AP VS Percent: 13.63 %
Brady Statistic AS VP Percent: 0 %
Brady Statistic AS VS Percent: 86.37 %
Brady Statistic RA Percent Paced: 13.26 %
Brady Statistic RV Percent Paced: 0 %
Date Time Interrogation Session: 20240205044223
HighPow Impedance: 87 Ohm
Implantable Lead Connection Status: 753985
Implantable Lead Connection Status: 753985
Implantable Lead Implant Date: 20131212
Implantable Lead Implant Date: 20131212
Implantable Lead Location: 753859
Implantable Lead Location: 753860
Implantable Lead Model: 5076
Implantable Lead Model: 6935
Implantable Pulse Generator Implant Date: 20131212
Lead Channel Impedance Value: 456 Ohm
Lead Channel Impedance Value: 703 Ohm
Lead Channel Impedance Value: 836 Ohm
Lead Channel Pacing Threshold Amplitude: 0.375 V
Lead Channel Pacing Threshold Amplitude: 0.75 V
Lead Channel Pacing Threshold Pulse Width: 0.4 ms
Lead Channel Pacing Threshold Pulse Width: 0.4 ms
Lead Channel Sensing Intrinsic Amplitude: 3.5 mV
Lead Channel Sensing Intrinsic Amplitude: 3.5 mV
Lead Channel Sensing Intrinsic Amplitude: 9.375 mV
Lead Channel Sensing Intrinsic Amplitude: 9.375 mV
Lead Channel Setting Pacing Amplitude: 2 V
Lead Channel Setting Pacing Amplitude: 2.5 V
Lead Channel Setting Pacing Pulse Width: 0.4 ms
Lead Channel Setting Sensing Sensitivity: 0.3 mV
Zone Setting Status: 755011
Zone Setting Status: 755011

## 2022-05-16 SURGERY — BIV UPGRADE

## 2022-05-16 MED ORDER — POVIDONE-IODINE 10 % EX SWAB
2.0000 | Freq: Once | CUTANEOUS | Status: AC
  Administered 2022-05-16: 2 via TOPICAL

## 2022-05-16 MED ORDER — LIDOCAINE HCL 1 % IJ SOLN
INTRAMUSCULAR | Status: AC
  Filled 2022-05-16: qty 60

## 2022-05-16 MED ORDER — VANCOMYCIN HCL IN DEXTROSE 1-5 GM/200ML-% IV SOLN
1000.0000 mg | INTRAVENOUS | Status: AC
  Administered 2022-05-16: 1000 mg via INTRAVENOUS

## 2022-05-16 MED ORDER — CHLORHEXIDINE GLUCONATE 4 % EX LIQD: 4.0000 | Freq: Once | CUTANEOUS | Status: DC

## 2022-05-16 MED ORDER — SODIUM CHLORIDE 0.9 % IV SOLN: INTRAVENOUS | Status: DC

## 2022-05-16 MED ORDER — FENTANYL CITRATE (PF) 100 MCG/2ML IJ SOLN
INTRAMUSCULAR | Status: DC | PRN
  Administered 2022-05-16 (×2): 12.5 ug via INTRAVENOUS

## 2022-05-16 MED ORDER — MIDAZOLAM HCL 5 MG/5ML IJ SOLN
INTRAMUSCULAR | Status: DC | PRN
  Administered 2022-05-16 (×2): 1 mg via INTRAVENOUS

## 2022-05-16 MED ORDER — METHYLPREDNISOLONE SODIUM SUCC 125 MG IJ SOLR
INTRAMUSCULAR | Status: DC | PRN
  Administered 2022-05-16: 62.5 mg via INTRAVENOUS

## 2022-05-16 MED ORDER — VANCOMYCIN HCL IN DEXTROSE 1-5 GM/200ML-% IV SOLN
INTRAVENOUS | Status: AC
  Filled 2022-05-16: qty 200

## 2022-05-16 MED ORDER — DIPHENHYDRAMINE HCL 50 MG/ML IJ SOLN
INTRAMUSCULAR | Status: AC
  Filled 2022-05-16: qty 1

## 2022-05-16 MED ORDER — SODIUM CHLORIDE 0.9 % IV SOLN
INTRAVENOUS | Status: AC
  Filled 2022-05-16: qty 2

## 2022-05-16 MED ORDER — METHYLPREDNISOLONE SODIUM SUCC 125 MG IJ SOLR
INTRAMUSCULAR | Status: AC
  Filled 2022-05-16: qty 2

## 2022-05-16 MED ORDER — IOHEXOL 350 MG/ML SOLN
INTRAVENOUS | Status: DC | PRN
  Administered 2022-05-16: 7 mL
  Administered 2022-05-16: 8 mL

## 2022-05-16 MED ORDER — MIDAZOLAM HCL 5 MG/5ML IJ SOLN
INTRAMUSCULAR | Status: AC
  Filled 2022-05-16: qty 5

## 2022-05-16 MED ORDER — LIDOCAINE HCL (PF) 1 % IJ SOLN
INTRAMUSCULAR | Status: DC | PRN
  Administered 2022-05-16: 50 mL

## 2022-05-16 MED ORDER — SODIUM CHLORIDE 0.9 % IV SOLN
80.0000 mg | INTRAVENOUS | Status: AC
  Administered 2022-05-16: 80 mg

## 2022-05-16 MED ORDER — HEPARIN (PORCINE) IN NACL 1000-0.9 UT/500ML-% IV SOLN
INTRAVENOUS | Status: DC | PRN
  Administered 2022-05-16: 500 mL

## 2022-05-16 MED ORDER — ONDANSETRON HCL 4 MG/2ML IJ SOLN: 4.0000 mg | Freq: Four times a day (QID) | INTRAMUSCULAR | Status: DC | PRN

## 2022-05-16 MED ORDER — DIPHENHYDRAMINE HCL 50 MG/ML IJ SOLN
INTRAMUSCULAR | Status: DC | PRN
  Administered 2022-05-16: 25 mg via INTRAVENOUS

## 2022-05-16 MED ORDER — FENTANYL CITRATE (PF) 100 MCG/2ML IJ SOLN
INTRAMUSCULAR | Status: AC
  Filled 2022-05-16: qty 2

## 2022-05-16 MED ORDER — ACETAMINOPHEN 325 MG PO TABS: 325.0000 mg | ORAL_TABLET | ORAL | Status: DC | PRN

## 2022-05-16 SURGICAL SUPPLY — 15 items
CABLE SURGICAL S-101-97-12 (CABLE) ×1 IMPLANT
CATH ATTAIN COM SURV 6250V-MB2 (CATHETERS) IMPLANT
CATH JOSEPH QUAD ALLRED 6F REP (CATHETERS) IMPLANT
GUIDEWIRE ANGLED .035X150CM (WIRE) IMPLANT
ICD COBALT XT QUAD CRT DTPA2Q1 (ICD Generator) IMPLANT
KIT MICROPUNCTURE NIT STIFF (SHEATH) IMPLANT
KIT WRENCH (KITS) IMPLANT
LEAD ATTAIN PERFORMA S 4598-88 (Lead) IMPLANT
PAD DEFIB RADIO PHYSIO CONN (PAD) ×1 IMPLANT
POUCH AIGIS-R ANTIBACT ICD (Mesh General) ×1 IMPLANT
POUCH AIGIS-R ANTIBACT ICD LRG (Mesh General) IMPLANT
SHEATH 9FR PRELUDE SNAP 13 (SHEATH) IMPLANT
SLITTER 6232ADJ (MISCELLANEOUS) IMPLANT
TRAY PACEMAKER INSERTION (PACKS) ×1 IMPLANT
WIRE ACUITY WHISPER EDS 4648 (WIRE) IMPLANT

## 2022-05-16 NOTE — Discharge Instructions (Addendum)
After Your ICD (Implantable Cardiac Defibrillator)   You have a Medtronic ICD  ACTIVITY Do not lift your arm above shoulder height for 1 week after your procedure. After 7 days, you may progress as below.  You should remove your sling 24 hours after your procedure, unless otherwise instructed by your provider.     Monday May 23, 2022  Tuesday May 24, 2022 Wednesday May 25, 2022 Thursday May 26, 2022   Do not lift, push, pull, or carry anything over 10 pounds with the affected arm until 6 weeks (Monday June 27, 2022 ) after your procedure.   You may drive AFTER your wound check, unless you have been told otherwise by your provider.   Ask your healthcare provider when you can go back to work   INCISION/Dressing If you are on a blood thinner such as Coumadin, Xarelto, Eliquis, Plavix, or Pradaxa please confirm with your provider when this should be resumed. 05-21-22  If large square, outer bandage is left in place, this can be removed after 24 hours from your procedure. Do not remove steri-strips or glue as below.   Monitor your defibrillator site for redness, swelling, and drainage. Call the device clinic at [phone] if you experience these symptoms or fever/chills.  If your incision is sealed with Steri-strips or staples, you may shower 7 days after your procedure or when told by your provider. Do not remove the steri-strips or let the shower hit directly on your site. You may wash around your site with soap and water.    If you were discharged in a sling, please do not wear this during the day more than 48 hours after your surgery unless otherwise instructed. This may increase the risk of stiffness and soreness in your shoulder.   Avoid lotions, ointments, or perfumes over your incision until it is well-healed.  You may use a hot tub or a pool AFTER your wound check appointment if the incision is completely closed.  Your ICD is designed to protect you from  life threatening heart rhythms. Because of this, you may receive a shock.   1 shock with no symptoms:  Call the office during business hours. 1 shock with symptoms (chest pain, chest pressure, dizziness, lightheadedness, shortness of breath, overall feeling unwell):  Call 911. If you experience 2 or more shocks in 24 hours:  Call 911. If you receive a shock, you should not drive for 6 months per the [place] DMV IF you receive appropriate therapy from your ICD.   ICD Alerts:  Some alerts are vibratory and others beep. These are NOT emergencies. Please call our office to let us know. If this occurs at night or on weekends, it can wait until the next business day. Send a remote transmission.  If your device is capable of reading fluid status (for heart failure), you will be offered monthly monitoring to review this with you.   DEVICE MANAGEMENT Remote monitoring is used to monitor your ICD from home. This monitoring is scheduled every 91 days by our office. It allows [place] to keep an eye on the functioning of your device to ensure it is working properly. You will routinely see your Electrophysiologist annually (more often if necessary).   You should receive your ID card for your new device in 4-8 weeks. Keep this card with you at all times once received. Consider wearing a medical alert bracelet or necklace.  Your ICD  may be MRI compatible. This will be discussed at your next  office visit/wound check.  You should avoid contact with strong electric or magnetic fields.   Do not use amateur (ham) radio equipment or electric (arc) welding torches. MP3 player headphones with magnets should not be used. Some devices are safe to use if held at least 12 inches (30 cm) from your defibrillator. These include power tools, lawn mowers, and speakers. If you are unsure if something is safe to use, ask your health care provider.  When using your cell phone, hold it to the ear that is on the opposite side from the  defibrillator. Do not leave your cell phone in a pocket over the defibrillator.  You may safely use electric blankets, heating pads, computers, and microwave ovens.  Call the office right away if: You have chest pain. You feel more than one shock. You feel more short of breath than you have felt before. You feel more light-headed than you have felt before. Your incision starts to open up.  This information is not intended to replace advice given to you by your health care provider. Make sure you discuss any questions you have with your health care provider.

## 2022-05-16 NOTE — H&P (Addendum)
HPI Paula Booker returns today for ongoing evaluation. She has a h/o ICM, chronic systolic heart failure, syncope, and a WPW pattern on her ECG. She underwent EP study which demonstrated no inducible arrhythmias. She underwent ICD insertion. She has a h/o generalized weakness.  She has had trouble with insomnia. Her energy level has worsened.  She admits to dietary indiscretion.  She is pending surgery.              Current Outpatient Medications  Medication Sig Dispense Refill   acetaminophen (TYLENOL) 500 MG tablet Take 500 mg by mouth in the morning and at bedtime.       albuterol (VENTOLIN HFA) 108 (90 Base) MCG/ACT inhaler Inhale 2 puffs into the lungs every 6 (six) hours as needed for wheezing. For wheezing. Use 2 puffs 3 times daily x5 days and then every 6 hours as needed. 18 g 5   alclomethasone (ACLOVATE) 0.05 % cream Apply topically 2 (two) times daily as needed.       apixaban (ELIQUIS) 5 MG TABS tablet Take 1 tablet by mouth twice daily 180 tablet 1   Calcium Carb-Cholecalciferol 600-20 MG-MCG TABS Take 1 tablet by mouth in the morning.       carvedilol (COREG) 6.25 MG tablet Take 1 tablet (6.25 mg total) by mouth 2 (two) times daily. 180 tablet 3   cetirizine (ZYRTEC) 10 MG tablet Take 10 mg by mouth in the morning.       Cholecalciferol (VITAMIN D3) 5000 units CAPS Take 5,000 Units by mouth in the morning.       Collagen-Vitamin C (COLLAGEN PLUS VITAMIN C PO) Take 1 tablet by mouth in the morning.       Cyanocobalamin (VITAMIN B-12) 5000 MCG LOZG Take 5,000 mcg by mouth in the morning.       empagliflozin (JARDIANCE) 10 MG TABS tablet Take 1 tablet (10 mg total) by mouth daily before breakfast. 90 tablet 3   escitalopram (LEXAPRO) 20 MG tablet Take 20 mg by mouth in the morning.       fluticasone furoate-vilanterol (BREO ELLIPTA) 200-25 MCG/INH AEPB Inhale 1 puff by mouth once daily 60 each 5   furosemide (LASIX) 40 MG tablet TAKE 1 TO 2 TABLETS BY MOUTH AS DIRECTED  ONCE DAILY FOR  EDEMA,  SHORTNESS  OF  BREATH  OR  WEIGHT  GAIN 180 tablet 1   ipratropium-albuterol (DUONEB) 0.5-2.5 (3) MG/3ML SOLN Take 3 mLs by nebulization every 4 (four) hours as needed. 360 mL 1   meclizine (ANTIVERT) 25 MG tablet Take 1 tablet (25 mg total) by mouth 3 (three) times daily as needed for dizziness. 20 tablet 0   Multiple Vitamin (MULTIVITAMIN WITH MINERALS) TABS tablet Take 1 tablet by mouth in the morning.       pantoprazole (PROTONIX) 40 MG tablet Take 40 mg by mouth daily before breakfast.   0   rosuvastatin (CRESTOR) 5 MG tablet Take 5 mg by mouth at bedtime.       sacubitril-valsartan (ENTRESTO) 24-26 MG Take 1 tablet by mouth 2 (two) times daily. 180 tablet 3   spironolactone (ALDACTONE) 25 MG tablet Take 1 tablet (25 mg total) by mouth daily. 90 tablet 3    No current facility-administered medications for this visit.            Past Medical History:  Diagnosis Date   AICD (automatic cardioverter/defibrillator) present     Anemia  as a child   Anxiety     Arthritis      "hands and knees" (03/22/2012)   Asthma     Basal cell carcinoma 04/27/2011    mid forehead(MOHS)   Bursitis     CHF (congestive heart failure) ([place]) 09/2011    pt. denies   Depression     Dysrhythmia      WPW   GERD (gastroesophageal reflux disease)     Head injury, acute, with loss of consciousness ([place])     History of hiatal hernia     Hypertension     ICD (implantable cardiac defibrillator) in place 03/2012   Migraines      migraines    NICM (nonischemic cardiomyopathy) ([place])     Obesity (BMI 30-39.9)      negative sleep study 2014   OSA (obstructive sleep apnea) 05/27/2018    Mild with AHI 6/hr    no cpap   Pneumonia      hx   Shortness of breath      "@ any time I can get SOB" (03/22/2012)   Skin cancer 1999   Squamous cell carcinoma of skin 07/08/2020    in situ right malar cheek-[id number]   Sudden arrhythmia death syndrome     WPW (Wolff-Parkinson-White syndrome)         ROS:    All systems reviewed and negative except as noted in the HPI.          Past Surgical History:  Procedure Laterality Date   BIOPSY   09/07/2021    Procedure: BIOPSY;  Surgeon: Otis Brace, MD;  Location: [facility];  Service: Gastroenterology;;   CARDIAC CATHETERIZATION   09/18/11    no significant CAD   CARDIAC DEFIBRILLATOR PLACEMENT   03/22/2012    DDD   CHOLECYSTECTOMY   ~ 2003   ESOPHAGOGASTRODUODENOSCOPY (EGD) WITH PROPOFOL N/A 09/07/2021    Procedure: ESOPHAGOGASTRODUODENOSCOPY (EGD) WITH PROPOFOL;  Surgeon: Otis Brace, MD;  Location: [facility];  Service: Gastroenterology;  Laterality: N/A;   IMPLANTABLE CARDIOVERTER DEFIBRILLATOR IMPLANT N/A 03/22/2012    Procedure: IMPLANTABLE CARDIOVERTER DEFIBRILLATOR IMPLANT;  Surgeon: Evans Lance, MD;  Location: [facility] CATH LAB;  Service: Cardiovascular;  Laterality: N/A;   KNEE ARTHROSCOPY Left 11/29/2017    Procedure: ARTHROSCOPY LEFT KNEE;  Surgeon: Dorna Leitz, MD;  Location: [facility];  Service: Orthopedics;  Laterality: Left;   KNEE ARTHROSCOPY Right 08/09/2019    Procedure: ARTHROSCOPY RIGHT KNEE, CHONDROPLASTY,  PARTIAL MEDIAL  MENSICECTOMY;  Surgeon: Dorna Leitz, MD;  Location: [facility];  Service: Orthopedics;  Laterality: Right;   KYPHOPLASTY N/A 11/27/2013    Procedure: KYPHOPLASTY;  Surgeon: Sinclair Ship, MD;  Location: [place];  Service: Orthopedics;  Laterality: N/A;  T9, T11 kyphoplasty   KYPHOPLASTY N/A 06/05/2014    Procedure: KYPHOPLASTY;  Surgeon: Sinclair Ship, MD;  Location: [place];  Service: Orthopedics;  Laterality: N/A;  T7 kyphoplasty   MOHS SURGERY   06/2011    "forehead" (03/22/2012)   [place]    "top of my head and my right back shoulder"   SKIN CANCER EXCISION        back   SKIN CANCER EXCISION        face   SUPRAVENTRICULAR TACHYCARDIA ABLATION   03/22/2012    unable to induce VT/RN report 03/22/2012   SUPRAVENTRICULAR TACHYCARDIA ABLATION N/A  03/22/2012    Procedure: SUPRAVENTRICULAR TACHYCARDIA ABLATION;  Surgeon: Evans Lance, MD;  Location: [facility]  CATH LAB;  Service: Cardiovascular;  Laterality: N/A;   TONSILLECTOMY AND ADENOIDECTOMY   1950   TUBAL LIGATION   1978             Family History  Problem Relation Age of Onset   Congestive Heart Failure Mother          died 79   Kidney Stones Mother     Deafness Mother     Atrial fibrillation Mother     Congestive Heart Failure Father     Deafness Father     Healthy Sister     Healthy Sister     Healthy Sister     Congestive Heart Failure Brother     Atrial fibrillation Brother     Tuberculosis Paternal Grandfather     Migraines Neg Hx     Neural tube defect Neg Hx          Social History         Socioeconomic History   Marital status: Divorced      Spouse name: Not on file   Number of children: 3   Years of education: Not on file   Highest education level: Not on file  Occupational History   Occupation: retired      Comment: Optometrist  Tobacco Use   Smoking status: Never   Smokeless tobacco: Never  Vaping Use   Vaping Use: Never used  Substance and Sexual Activity   Alcohol use: Not Currently      Comment: rare   Drug use: No   Sexual activity: Not Currently  Other Topics Concern   Not on file  Social History Narrative    Divorced, 3 children, 9 grandchildren. Did work for [employer]. Retired at 77 y/o .    Social Determinants of Health    Financial Resource Strain: Not on file  Food Insecurity: Not on file  Transportation Needs: Not on file  Physical Activity: Not on file  Stress: Not on file  Social Connections: Not on file  Intimate Partner Violence: Not on file        BP 124/78   Pulse 79   Ht '5\' 4"'$  (1.626 m)   Wt 185 lb 9.6 oz (84.2 kg)   SpO2 97%   BMI 31.86 kg/m    Physical Exam:   Well appearing NAD HEENT: Unremarkable Neck:  No JVD, no thyromegally Lymphatics:  No adenopathy Back:  No CVA tenderness Lungs:   Clear HEART:  Regular rate rhythm, no murmurs, no rubs, no clicks Abd:  soft, positive bowel sounds, no organomegally, no rebound, no guarding Ext:  2 plus pulses, no edema, no cyanosis, no clubbing Skin:  No rashes no nodules Neuro:  CN II through XII intact, motor grossly intact     DEVICE  Normal device function.  See PaceArt for details.    Assess/Plan: 1. Chronic systolic heart failure -her symptoms are class 2A. She has felt poorly. She will continue Entresto and her dose of her coreg to 6.25 mg bid. 2. ICD - her DDD ICD is working normally and she has just over 2 years of battery longevity. 3. Obesity - I encouraged her to lose weight.  She is down about 15 lbs. 4. HTN - her bp is well controlled. She will maintain a low sodium diet. 5. PAF - she is maintaining NSR. She will continue her systemic anti-coag.   Salome Spotted  EP Attending  Since her prior clinic visit, she has reached ERI and  will undergo ICD gen change out. I have reviewed her 12 lead ECG and she has developed LBBB and we will plan to place a LV lead. I have reviewed the rationale for this with the patient and she wishes to proceed. She has class 3 CHF.  Carleene Overlie Azul Coffie,MD

## 2022-05-17 ENCOUNTER — Encounter (HOSPITAL_COMMUNITY): Payer: Self-pay | Admitting: Internal Medicine

## 2022-05-18 ENCOUNTER — Encounter: Payer: Self-pay | Admitting: Internal Medicine

## 2022-05-18 NOTE — Telephone Encounter (Signed)
Spoke with patient, voiced understanding of restart of blood thinner date, patient did sound in considerable pain see MyChart message advised patient to go ahead and take 2 Tylenol and try to ice site if she can tolerate it. Patient voiced understanding of these instructions also advised patient to take tylenol every 6-8hrs to keep pain under control patient voiced understanding

## 2022-05-18 NOTE — Telephone Encounter (Signed)
-----   Message from [facility] [place] sent at 05/16/2022  2:30 PM EST ----- Same day d/c MDT Bive upgrade GT Eliquis to restart 2/10

## 2022-05-19 ENCOUNTER — Emergency Department (HOSPITAL_COMMUNITY): Payer: Medicare HMO

## 2022-05-19 ENCOUNTER — Other Ambulatory Visit: Payer: Self-pay

## 2022-05-19 ENCOUNTER — Encounter: Payer: Self-pay | Admitting: Internal Medicine

## 2022-05-19 ENCOUNTER — Observation Stay (HOSPITAL_COMMUNITY)
Admission: EM | Admit: 2022-05-19 | Discharge: 2022-05-20 | Disposition: A | Discharge status: Home / Self Care | Payer: Medicare HMO | Attending: Student in an Organized Health Care Education/Training Program | Admitting: Student in an Organized Health Care Education/Training Program

## 2022-05-19 ENCOUNTER — Encounter (HOSPITAL_COMMUNITY): Payer: Self-pay | Admitting: Student in an Organized Health Care Education/Training Program

## 2022-05-19 DIAGNOSIS — G4733 Obstructive sleep apnea (adult) (pediatric): Secondary | ICD-10-CM | POA: Diagnosis present

## 2022-05-19 DIAGNOSIS — I480 Paroxysmal atrial fibrillation: Secondary | ICD-10-CM | POA: Diagnosis not present

## 2022-05-19 DIAGNOSIS — I10 Essential (primary) hypertension: Secondary | ICD-10-CM | POA: Diagnosis present

## 2022-05-19 DIAGNOSIS — I639 Cerebral infarction, unspecified: Secondary | ICD-10-CM | POA: Diagnosis present

## 2022-05-19 DIAGNOSIS — H53462 Homonymous bilateral field defects, left side: Secondary | ICD-10-CM | POA: Diagnosis not present

## 2022-05-19 DIAGNOSIS — Z85828 Personal history of other malignant neoplasm of skin: Secondary | ICD-10-CM | POA: Diagnosis not present

## 2022-05-19 DIAGNOSIS — G20C Parkinsonism, unspecified: Secondary | ICD-10-CM | POA: Diagnosis not present

## 2022-05-19 DIAGNOSIS — I110 Hypertensive heart disease with heart failure: Secondary | ICD-10-CM | POA: Diagnosis not present

## 2022-05-19 DIAGNOSIS — Z79899 Other long term (current) drug therapy: Secondary | ICD-10-CM | POA: Insufficient documentation

## 2022-05-19 DIAGNOSIS — I428 Other cardiomyopathies: Secondary | ICD-10-CM

## 2022-05-19 DIAGNOSIS — I5040 Unspecified combined systolic (congestive) and diastolic (congestive) heart failure: Secondary | ICD-10-CM | POA: Insufficient documentation

## 2022-05-19 DIAGNOSIS — Z7901 Long term (current) use of anticoagulants: Secondary | ICD-10-CM | POA: Insufficient documentation

## 2022-05-19 DIAGNOSIS — T827XXA Infection and inflammatory reaction due to other cardiac and vascular devices, implants and grafts, initial encounter: Secondary | ICD-10-CM | POA: Diagnosis present

## 2022-05-19 DIAGNOSIS — R29818 Other symptoms and signs involving the nervous system: Secondary | ICD-10-CM | POA: Diagnosis not present

## 2022-05-19 DIAGNOSIS — H547 Unspecified visual loss: Secondary | ICD-10-CM | POA: Diagnosis not present

## 2022-05-19 DIAGNOSIS — R42 Dizziness and giddiness: Secondary | ICD-10-CM | POA: Diagnosis present

## 2022-05-19 DIAGNOSIS — J45909 Unspecified asthma, uncomplicated: Secondary | ICD-10-CM | POA: Diagnosis not present

## 2022-05-19 DIAGNOSIS — Z950 Presence of cardiac pacemaker: Secondary | ICD-10-CM | POA: Diagnosis present

## 2022-05-19 DIAGNOSIS — G459 Transient cerebral ischemic attack, unspecified: Secondary | ICD-10-CM

## 2022-05-19 LAB — COMPREHENSIVE METABOLIC PANEL
ALT: 13 U/L (ref 0–44)
AST: 23 U/L (ref 15–41)
Albumin: 3.7 g/dL (ref 3.5–5.0)
Alkaline Phosphatase: 70 U/L (ref 38–126)
Anion gap: 11 (ref 5–15)
BUN: 19 mg/dL (ref 8–23)
CO2: 28 mmol/L (ref 22–32)
Calcium: 9.5 mg/dL (ref 8.9–10.3)
Chloride: 100 mmol/L (ref 98–111)
Creatinine, Ser: 0.94 mg/dL (ref 0.44–1.00)
GFR, Estimated: >60 mL/min (ref >60)
Glucose, Bld: 101 mg/dL — ABNORMAL HIGH (ref 70–99)
Potassium: 3.7 mmol/L (ref 3.5–5.1)
Sodium: 139 mmol/L (ref 135–145)
Total Bilirubin: 0.5 mg/dL (ref 0.3–1.2)
Total Protein: 6.7 g/dL (ref 6.5–8.1)

## 2022-05-19 LAB — CBC WITH DIFFERENTIAL/PLATELET
Abs Immature Granulocytes: 0.02 10*3/uL (ref 0.00–0.07)
Basophils Absolute: 0 10*3/uL (ref 0.0–0.1)
Basophils Relative: 0 %
Eosinophils Absolute: 0.3 10*3/uL (ref 0.0–0.5)
Eosinophils Relative: 3 %
HCT: 44.7 % (ref 36.0–46.0)
Hemoglobin: 15.3 g/dL — ABNORMAL HIGH (ref 12.0–15.0)
Immature Granulocytes: 0 %
Lymphocytes Relative: 31 %
Lymphs Abs: 2.8 10*3/uL (ref 0.7–4.0)
MCH: 31.9 pg (ref 26.0–34.0)
MCHC: 34.2 g/dL (ref 30.0–36.0)
MCV: 93.1 fL (ref 80.0–100.0)
Monocytes Absolute: 0.9 10*3/uL (ref 0.1–1.0)
Monocytes Relative: 10 %
Neutro Abs: 5.1 10*3/uL (ref 1.7–7.7)
Neutrophils Relative %: 56 %
Platelets: 207 10*3/uL (ref 150–400)
RBC: 4.8 MIL/uL (ref 3.87–5.11)
RDW: 12.3 % (ref 11.5–15.5)
WBC: 9.3 10*3/uL (ref 4.0–10.5)
nRBC: 0 % (ref 0.0–0.2)

## 2022-05-19 LAB — RAPID URINE DRUG SCREEN, HOSP PERFORMED
Amphetamines: NOT DETECTED
Barbiturates: NOT DETECTED
Benzodiazepines: NOT DETECTED
Cocaine: NOT DETECTED
Opiates: NOT DETECTED
Tetrahydrocannabinol: NOT DETECTED

## 2022-05-19 LAB — APTT: aPTT: 26 seconds (ref 24–36)

## 2022-05-19 LAB — URINALYSIS, ROUTINE W REFLEX MICROSCOPIC
Bilirubin Urine: NEGATIVE
Glucose, UA: >=500 mg/dL — AB
Ketones, ur: NEGATIVE mg/dL
Nitrite: NEGATIVE
Protein, ur: NEGATIVE mg/dL
Specific Gravity, Urine: >1.046 — ABNORMAL HIGH (ref 1.005–1.030)
pH: 6 (ref 5.0–8.0)

## 2022-05-19 LAB — HEMOGLOBIN A1C
Hgb A1c MFr Bld: 5.7 % — ABNORMAL HIGH (ref 4.8–5.6)
Mean Plasma Glucose: 116.89 mg/dL

## 2022-05-19 LAB — ETHANOL: Alcohol, Ethyl (B): <10 mg/dL (ref <10)

## 2022-05-19 LAB — CBG MONITORING, ED: Glucose-Capillary: 92 mg/dL (ref 70–99)

## 2022-05-19 LAB — PROTIME-INR
INR: 1.1 (ref 0.8–1.2)
Prothrombin Time: 14.1 seconds (ref 11.4–15.2)

## 2022-05-19 MED ORDER — ASPIRIN 81 MG PO CHEW
81.0000 mg | CHEWABLE_TABLET | Freq: Every day | ORAL | Status: DC
  Administered 2022-05-19 – 2022-05-20 (×2): 81 mg via ORAL
  Filled 2022-05-19 (×2): qty 1

## 2022-05-19 MED ORDER — DIPHENHYDRAMINE HCL 50 MG/ML IJ SOLN
50.0000 mg | Freq: Once | INTRAMUSCULAR | Status: AC
  Administered 2022-05-19: 50 mg via INTRAVENOUS
  Filled 2022-05-19: qty 1

## 2022-05-19 MED ORDER — DIPHENHYDRAMINE HCL 25 MG PO CAPS: 50.0000 mg | ORAL_CAPSULE | Freq: Once | ORAL | Status: AC

## 2022-05-19 MED ORDER — IOHEXOL 350 MG/ML SOLN
100.0000 mL | Freq: Once | INTRAVENOUS | Status: AC | PRN
  Administered 2022-05-19: 100 mL via INTRAVENOUS

## 2022-05-19 MED ORDER — ROSUVASTATIN CALCIUM 20 MG PO TABS
20.0000 mg | ORAL_TABLET | Freq: Every day | ORAL | Status: DC
  Administered 2022-05-19: 20 mg via ORAL
  Filled 2022-05-19: qty 1

## 2022-05-19 MED ORDER — ENOXAPARIN SODIUM 40 MG/0.4ML IJ SOSY
40.0000 mg | PREFILLED_SYRINGE | INTRAMUSCULAR | Status: DC
  Administered 2022-05-19: 40 mg via SUBCUTANEOUS
  Filled 2022-05-19: qty 0.4

## 2022-05-19 MED ORDER — PANTOPRAZOLE SODIUM 40 MG PO TBEC
40.0000 mg | DELAYED_RELEASE_TABLET | Freq: Every day | ORAL | Status: DC
  Administered 2022-05-20: 40 mg via ORAL
  Filled 2022-05-19: qty 1

## 2022-05-19 MED ORDER — METHYLPREDNISOLONE SODIUM SUCC 40 MG IJ SOLR
40.0000 mg | Freq: Once | INTRAMUSCULAR | Status: AC
  Administered 2022-05-19: 40 mg via INTRAVENOUS
  Filled 2022-05-19: qty 1

## 2022-05-19 MED ORDER — ESCITALOPRAM OXALATE 10 MG PO TABS
20.0000 mg | ORAL_TABLET | Freq: Every day | ORAL | Status: DC
  Administered 2022-05-20: 20 mg via ORAL
  Filled 2022-05-19: qty 2

## 2022-05-19 MED ORDER — ACETAMINOPHEN 325 MG PO TABS
650.0000 mg | ORAL_TABLET | Freq: Four times a day (QID) | ORAL | Status: DC | PRN
  Administered 2022-05-19 – 2022-05-20 (×2): 650 mg via ORAL
  Filled 2022-05-19 (×2): qty 2

## 2022-05-19 MED ORDER — ACETAMINOPHEN 650 MG RE SUPP: 650.0000 mg | Freq: Four times a day (QID) | RECTAL | Status: DC | PRN

## 2022-05-19 MED ORDER — CLINDAMYCIN HCL 300 MG PO CAPS
300.0000 mg | ORAL_CAPSULE | Freq: Four times a day (QID) | ORAL | Status: DC
  Administered 2022-05-20 (×3): 300 mg via ORAL
  Filled 2022-05-19 (×8): qty 1

## 2022-05-19 MED ORDER — STROKE: EARLY STAGES OF RECOVERY BOOK
Freq: Once | Status: AC
  Administered 2022-05-20: 1
  Filled 2022-05-19: qty 1

## 2022-05-19 NOTE — Progress Notes (Signed)
Handoff report received over the phone from ED nurse, Lorane Gell. Awaiting for the patient to be transported to our unit.

## 2022-05-19 NOTE — ED Provider Notes (Signed)
  Physical Exam  BP (!) 112/53 (BP Location: Right Arm)   Pulse 73   Temp 98.1 F (36.7 C) (Oral)   Resp 16   Ht '5\' 3"'$  (1.6 m)   Wt 83.8 kg   SpO2 96%   BMI 32.73 kg/m     Procedures  Procedures  ED Course / MDM   Clinical Course as of 05/20/22 0020  Thu May 19, 2022  1435 Consult and spoke with Dr. Rory Percy who recommends calling code stroke for LVO. This has been done. He will come see patient at bedside.  [LA]  1457 Lkk 7am sudden left side peripheral vision lossand dizziness . Then went to bed. Woke up even more dizzy. Recent AICD replacement for WPW, cardiomyopathy, pAF and sudden arrythmia death syndrome, has not been taking eliquis since Friday for procedure. Code stroke called for LVO. [JS]    Clinical Course User Index [JS] Donzetta Matters, MD [LA] Mickie Hillier, PA-C   Medical Decision Making Paula Booker is a 77 year old female with past medical history documented above and clinical course.  Please see previous providers note for further information on presentation and HPI.  Patient obtained CT code stroke with CT angio head and neck.  Patient CT head shows no evidence of acute large vessel infarct or hemorrhage.  Patient's CTA head and neck shows no evidence of acute vascular occlusion, dissection etc.  I discussed this patient with neurology.  Please see their note for final recommendations of the patient.  Ultimately, she will be admitted to hospitalist for further management.   Risk Decision regarding hospitalization.          Donzetta Matters, MD 05/20/22 Quentin Mulling    Noemi Chapel, MD 05/22/22 1900

## 2022-05-19 NOTE — Progress Notes (Signed)
Patient just arrived to the floor as a new admission. Page was sent to admits through [facility] to notifiy of patient's arrival.

## 2022-05-19 NOTE — H&P (Signed)
Date: 05/19/2022         Patient Name:  Paula Booker MRN: [id number]  DOB: 03/02/1946 Age / Sex: 77 y.o., female   PCP: Paula Dials, MD         Medical Service: Internal Medicine Teaching Service         Attending Physician: Dr. Evette Booker, Paula Booker, *    First Contact: Dr. Carin Booker Pager: [phone]  Second Contact: Dr. Raymondo Booker Pager: [phone]       After Hours (After 5p/  First Contact Pager: [phone]  weekends / holidays): Second Contact Pager: [phone]   Chief Concern:  Dizziness and visual changes  History of Present Illness:  This person was in their usual state of health the morning of 05/19/2022 when, while watching television and having coffee, she had sudden onset of decreased left sided peripheral vision.  She is a Engineer, manufacturing systems, and describes suddenly being unable to finish a straight line that she was drawing to plan a quilt pattern, because she could not see where the line was finishing.  This was associated with the sensation of "dizziness."  She describes having to lean against the wall as she walked to her bedroom to lay down.  She did not feel faint or like she was going to pass out.  She laid down and fell asleep, but when she woke up her symptoms were still present.  She called her doctor who advised her to go to the emergency department by ambulance.  On her arrival code stroke was called.  Neurologic exam was notable for left upper quadrant field cut, mildly decreased left upper extremity grip strength, and decreased sensation on her left side.  CT head without contrast showed no acute changes to account for her clinical syndrome.  At the time of our interview, she denies dizziness, but states that she would not feel confident walking down the hall.  She does not have peripheral visual deficit.  Recent history is notable for replacement of generator for implantable defibrillator on Monday.  Her Eliquis was held for this, with instructions to restart on February 10.   Notably, the surgical site felt better after the procedure, but then began to feel worse again 2 days later.  Now it is very red, warm, and tender.  Review of Systems  Eyes:  Positive for photophobia.  Cardiovascular:  Negative for chest pain and palpitations.  Gastrointestinal:  Negative for nausea and vomiting.  Neurological:  Negative for sensory change, speech change, focal weakness and headaches.   Medications:  - rosuvastatin  - AICD last shocked her a few years ago - albuterol - eliquis last Friday (start back Saturday) - coreg 6.25 mg bid - entresto 50 mg bid - lexapro 20 mg - lasix 40 mg bid  - jardiance 10 mg - spiro 25 mg  Allergies: - Iodinated contrast media - tolerates with Benadryl - Penicillins (anaphylaxis)  Past Medical History: - Combined systolic and diastolic heart failure - A-fib - Wolff-Parkinson-White - Cirrhosis (NAFLD) - Hypertension - Hyperlipidemia  Surgical History: Notable for recent implantable defibrillator generator change to 21  Family History:  Lung cancer in sister Mother had diabetes, heart attack, and heart failure Negative for stroke  Social History:  4 children, 15 grandchildren Lives in [place] with ADLs and IADLs Drives PCP is Dr. Aura Booker No tobacco, alcohol, recreational drug use  Physical Exam: Blood pressure 116/63, pulse 78, temperature 98.4 F (36.9 C), temperature source Oral, resp. rate 19, height  $5' 3"X$  (1.6 m), weight 83 kg, SpO2 96 %.  Comfortable appearing Heart rate and rhythm are regular, left radial pulse 2+, capillary refill is somewhat sluggish Breathing is regular and unlabored, anterior lung fields are clear to auscultation Abdomen is soft and nontender Skin is warm and dry Alert and oriented, face is symmetric, tongue and uvula are midline, facial sensation intact, no visual field deficits, no gross focal weakness, dysmetria not appreciated on finger-to-nose testing Pleasant,  in good humor, mood and affect are concordant  EKG:  V paced rhythm at 82 bpm, regular without ischemic changes since last EKG 05/16/2022  Labs:  BMP and CBC are unremarkable  Images and other studies: CT head without contrast was unremarkable CT angio without evidence of large vessel occlusion or significant stenosis  Assessment & Plan  Paula Booker is a 77 y.o. with atrial fibrillation and combined systolic and diastolic heart failure admitted due to concern for stroke with negative imaging findings.  Principal Problem:   TIA (transient ischemic attack)  Left peripheral visual deficit Dizziness The tempo of the symptoms, her description of a very focal visual deficit, the initial neurologic exam, and her history are highly suggestive of cerebrovascular accident.  Moreover, her anticoagulation was held because of her recent electrophysiology procedure.  Although her initial head CT was negative, this is not the best test for ruling out cerebral infarction.  Unfortunately because of her pacemaker/defibrillator she is unable to undergo MRI at this time.  This patient does have risk factors for ASCVD including hypertension and hyperlipidemia although no history of coronary artery disease, and a coronary artery calcium score of 0 as of 2022.  Plan to reimage with CT head tomorrow to check for evolution of potential ischemic stroke.  Lipid panel pending.  Start antiplatelet and increase home statin dose.  Hold Eliquis for now, as this patient was instructed to restart this medication 05/21/22. - CT head tomorrow at 1500 - Follow-up echocardiogram - Aspirin 81 mg daily - Increase rosuvastatin to 20 mg daily - Monitor on telemetry - PT/OT  Concern for surgical site infection Patient's history of improved then worsened pain and erythema around the site of her defibrillator/pacemaker is concerning for soft tissue infection.  The site is exquisitely tender but there are no signs of abscess.  No  systemic signs of infection.  Unfortunately because of this patient's anaphylaxis with penicillin, antibiotic choices are limited. - Clindamycin 300 mg p.o. 4 times daily  History of combined systolic and diastolic heart failure Nonischemic cardiomyopathy.  Symptoms are class IIa-III.  She is status post generator change for her implantable pacemaker/defibrillator on 05/16/2022.  She is euvolemic at this time.  Restart home medications tomorrow to avoid hypotension.  Diet: Regular IVF: None VTE: Lovenox Code: Full code Surrogate: Daughter, Jeannine Kitten  Admit patient to Observation with expected length of stay less than 2 midnights.  Signed: Nani Gasser MD 05/19/2022, 6:29 PM  Pager: [phone] After 5pm on weekdays and 1pm on weekends: [phone]

## 2022-05-19 NOTE — Code Documentation (Signed)
Stroke Response Nurse Documentation Code Documentation  Paula Booker is a 77 y.o. female arriving to [facility]  via [facility] on 05/19/2022 with past medical hx of CHF, SOB, CHest Pain, HTN, Migraines. Code stroke was activated by ED.   Patient from home where she was LKW at 0700 and now complaining of left vision changes. Pt was at home at her baseline when she woke up at 0545. SHe has had a recent AICD placed for WPW, cardiomyapthy, pAF and sudden arrythmia death syndrome and has not taken eliquis since Friday. Pt reports that she suddenly had some left periphercal vision changes while watching TV. She tried to go back to sleep to see if it would get better and it continued. Called EMS.   Stroke team at the bedside on patient arrival. Labs drawn and patient cleared for CT by Lauren PA.  Patient to CT with team. NIHSS 2, see documentation for details and code stroke times. Patient with left leg weakness and left decreased sensation pt reports vision getting better and a very small peripheral area not present when eye exam completed, but quadrants intact. The following imaging was completed:  CT Head, CTA, and CTP. Patient is not a candidate for IV Thrombolytic due to outside window. Patient is not a candidate for IR due to no LVO on imaging per MD Rory Percy.   Care Plan: q2 NIHSS/VS x 12 hours, permissive HTN (< 220/120).   Bedside handoff with ED RN ISaac.    Kathrin Greathouse  Stroke Response RN

## 2022-05-19 NOTE — ED Notes (Signed)
ED TO INPATIENT HANDOFF REPORT  ED Nurse Name and Phone #:  [phone]  S Name/Age/Gender Paula Booker 77 y.o. female Room/Bed: 022C/022C  Code Status   Code Status: Full Code  Home/SNF/Other Home Patient oriented to: self, place, time, and situation Is this baseline? Yes   Triage Complete: Triage complete  Chief Complaint TIA (transient ischemic attack) [G45.9]  Triage Note Patient BIB EMS via home for loss of peripheral vision. Patient states it seems she is having more trouble with the left side. Patient states this started this morning at 0700. Patient states she did nothing for this to happen. Patient denies N/V/D. Patient is A&Ox4.   Allergies Allergies  Allergen Reactions   Ivp Dye [Iodinated Contrast Media] Hives and Other (See Comments)    pt tolerates with benadryl   Penicillins Anaphylaxis    Has patient had a PCN reaction causing immediate rash, facial/tongue/throat swelling, SOB or lightheadedness with hypotension: Yes Has patient had a PCN reaction causing severe rash involving mucus membranes or skin necrosis: No Has patient had a PCN reaction that required hospitalization No Has patient had a PCN reaction occurring within the last 10 years: No If all of the above answers are "NO", then may proceed with Cephalosporin use.   Sulfonamide Derivatives Other (See Comments)    Migraines   Tessalon [Benzonatate] Hives and Rash    Severe rash   Sulfa Antibiotics Other (See Comments)    Migraines   Rosuvastatin Calcium Itching   Onion Other (See Comments)    Raw onions cause migraines    Level of Care/Admitting Diagnosis ED Disposition     ED Disposition  Admit   Condition  --   Comment  Hospital Area: [facility] [[id number]]  Level of Care: Telemetry Medical [104]  May place patient in observation at [facility] or [place] if equivalent level of care is available:: No  Covid Evaluation: Asymptomatic - no recent exposure (last 10  days) testing not required  Diagnosis: TIA (transient ischemic attack) [259563]  Admitting Physician: Axel Filler [[id number]]  Attending Physician: Axel Filler [[id number]]          B Medical/Surgery History Past Medical History:  Diagnosis Date   AICD (automatic cardioverter/defibrillator) present    Anemia    as a child   Anxiety    Arthritis    "hands and knees" (03/22/2012)   Asthma    Basal cell carcinoma 04/27/2011   mid forehead(MOHS)   Bursitis    CHF (congestive heart failure) ([place]) 09/2011   pt. denies   Depression    Dysrhythmia    WPW   GERD (gastroesophageal reflux disease)    Head injury, acute, with loss of consciousness ([place])    History of hiatal hernia    Hypertension    ICD (implantable cardiac defibrillator) in place 03/2012   Migraines    migraines    Nausea vomiting and diarrhea 03/18/2015   NICM (nonischemic cardiomyopathy) ([place])    Obesity (BMI 30-39.9)    negative sleep study 2014   OSA (obstructive sleep apnea) 05/27/2018   Mild with AHI 6/hr    no cpap   Pneumonia    hx   Shortness of breath    "@ any time I can get SOB" (03/22/2012)   Skin cancer 1999   Squamous cell carcinoma of skin 07/08/2020   in situ right malar cheek-[id number]   Sudden arrhythmia death syndrome    WPW (Wolff-Parkinson-White syndrome)  Past Surgical History:  Procedure Laterality Date   BIOPSY  09/07/2021   Procedure: BIOPSY;  Surgeon: Otis Brace, MD;  Location: [facility];  Service: Gastroenterology;;   Zollie Beckers N/A 05/16/2022   Procedure: BIV UPGRADE;  Surgeon: Evans Lance, MD;  Location: [name];  Service: Cardiovascular;  Laterality: N/A;   CARDIAC CATHETERIZATION  09/18/11   no significant CAD   CARDIAC DEFIBRILLATOR PLACEMENT  03/22/2012   DDD   CHOLECYSTECTOMY  ~ 2003   ESOPHAGOGASTRODUODENOSCOPY (EGD) WITH PROPOFOL N/A 09/07/2021   Procedure: ESOPHAGOGASTRODUODENOSCOPY (EGD) WITH PROPOFOL;  Surgeon: Otis Brace, MD;  Location: [facility];  Service: Gastroenterology;  Laterality: N/A;   IMPLANTABLE CARDIOVERTER DEFIBRILLATOR IMPLANT N/A 03/22/2012   Procedure: IMPLANTABLE CARDIOVERTER DEFIBRILLATOR IMPLANT;  Surgeon: Evans Lance, MD;  Location: [facility] CATH LAB;  Service: Cardiovascular;  Laterality: N/A;   KNEE ARTHROSCOPY Left 11/29/2017   Procedure: ARTHROSCOPY LEFT KNEE;  Surgeon: Dorna Leitz, MD;  Location: [facility] ORS;  Service: Orthopedics;  Laterality: Left;   KNEE ARTHROSCOPY Right 08/09/2019   Procedure: ARTHROSCOPY RIGHT KNEE, CHONDROPLASTY,  PARTIAL MEDIAL  MENSICECTOMY;  Surgeon: Dorna Leitz, MD;  Location: [facility];  Service: Orthopedics;  Laterality: Right;   KYPHOPLASTY N/A 11/27/2013   Procedure: KYPHOPLASTY;  Surgeon: Sinclair Ship, MD;  Location: [facility];  Service: Orthopedics;  Laterality: N/A;  T9, T11 kyphoplasty   KYPHOPLASTY N/A 06/05/2014   Procedure: KYPHOPLASTY;  Surgeon: Sinclair Ship, MD;  Location: [facility];  Service: Orthopedics;  Laterality: N/A;  T7 kyphoplasty   MOHS SURGERY  06/2011   "forehead" (03/22/2012)   [place]   "top of my head and my right back shoulder"   SKIN CANCER EXCISION     back   SKIN CANCER EXCISION     face   SUPRAVENTRICULAR TACHYCARDIA ABLATION  03/22/2012   unable to induce VT/RN report 03/22/2012   SUPRAVENTRICULAR TACHYCARDIA ABLATION N/A 03/22/2012   Procedure: SUPRAVENTRICULAR TACHYCARDIA ABLATION;  Surgeon: Evans Lance, MD;  Location: [facility] CATH LAB;  Service: Cardiovascular;  Laterality: N/A;   [place]     A IV Location/Drains/Wounds Patient Lines/Drains/Airways Status     Active Line/Drains/Airways     Name Placement date Placement time Site Days   Peripheral IV 05/19/22 20 G Right Antecubital 05/19/22  1512  Antecubital  less than 1            Intake/Output Last 24 hours No intake or output data in the 24 hours ending 05/19/22  1814  Labs/Imaging Results for orders placed or performed during the hospital encounter of 05/19/22 (from the past 48 hour(s))  Comprehensive metabolic panel     Status: Abnormal   Collection Time: 05/19/22  2:40 PM  Result Value Ref Range   Sodium 139 135 - 145 mmol/L   Potassium 3.7 3.5 - 5.1 mmol/L   Chloride 100 98 - 111 mmol/L   CO2 28 22 - 32 mmol/L   Glucose, Bld 101 (H) 70 - 99 mg/dL    Comment: Glucose reference range applies only to samples taken after fasting for at least 8 hours.   BUN 19 8 - 23 mg/dL   Creatinine, Ser 0.94 0.44 - 1.00 mg/dL   Calcium 9.5 8.9 - 10.3 mg/dL   Total Protein 6.7 6.5 - 8.1 g/dL   Albumin 3.7 3.5 - 5.0 g/dL   AST 23 15 - 41 U/L   ALT 13  0 - 44 U/L   Alkaline Phosphatase 70 38 - 126 U/L   Total Bilirubin 0.5 0.3 - 1.2 mg/dL   GFR, Estimated >60 >60 mL/min    Comment: (NOTE) Calculated using the CKD-EPI Creatinine Equation (2021)    Anion gap 11 5 - 15    Comment: Performed at [facility] [address] [place], [place] [address]  CBC with Differential/Platelet     Status: Abnormal   Collection Time: 05/19/22  2:40 PM  Result Value Ref Range   WBC 9.3 4.0 - 10.5 K/uL   RBC 4.80 3.87 - 5.11 MIL/uL   Hemoglobin 15.3 (H) 12.0 - 15.0 g/dL   HCT 44.7 36.0 - 46.0 %   MCV 93.1 80.0 - 100.0 fL   MCH 31.9 26.0 - 34.0 pg   MCHC 34.2 30.0 - 36.0 g/dL   RDW 12.3 11.5 - 15.5 %   Platelets 207 150 - 400 K/uL   nRBC 0.0 0.0 - 0.2 %   Neutrophils Relative % 56 %   Neutro Abs 5.1 1.7 - 7.7 K/uL   Lymphocytes Relative 31 %   Lymphs Abs 2.8 0.7 - 4.0 K/uL   Monocytes Relative 10 %   Monocytes Absolute 0.9 0.1 - 1.0 K/uL   Eosinophils Relative 3 %   Eosinophils Absolute 0.3 0.0 - 0.5 K/uL   Basophils Relative 0 %   Basophils Absolute 0.0 0.0 - 0.1 K/uL   Immature Granulocytes 0 %   Abs Immature Granulocytes 0.02 0.00 - 0.07 K/uL    Comment: Performed at [facility] [address] [place], [place] [address]  CBG monitoring, ED      Status: None   Collection Time: 05/19/22  3:11 PM  Result Value Ref Range   Glucose-Capillary 92 70 - 99 mg/dL    Comment: Glucose reference range applies only to samples taken after fasting for at least 8 hours.  Hemoglobin A1c     Status: Abnormal   Collection Time: 05/19/22  4:00 PM  Result Value Ref Range   Hgb A1c MFr Bld 5.7 (H) 4.8 - 5.6 %    Comment: (NOTE) Pre diabetes:          5.7%-6.4%  Diabetes:              >6.4%  Glycemic control for   <7.0% adults with diabetes    Mean Plasma Glucose 116.89 mg/dL    Comment: Performed at [facility] [address], [place], [place] [address]  Urine rapid drug screen (hosp performed)     Status: None   Collection Time: 05/19/22  5:22 PM  Result Value Ref Range   Opiates NONE DETECTED NONE DETECTED   Cocaine NONE DETECTED NONE DETECTED   Benzodiazepines NONE DETECTED NONE DETECTED   Amphetamines NONE DETECTED NONE DETECTED   Tetrahydrocannabinol NONE DETECTED NONE DETECTED   Barbiturates NONE DETECTED NONE DETECTED    Comment: (NOTE) DRUG SCREEN FOR MEDICAL PURPOSES ONLY.  IF CONFIRMATION IS NEEDED FOR ANY PURPOSE, NOTIFY LAB WITHIN 5 DAYS.  LOWEST DETECTABLE LIMITS FOR URINE DRUG SCREEN Drug Class                     Cutoff (ng/mL) Amphetamine and metabolites    1000 Barbiturate and metabolites    200 Benzodiazepine                 200 Opiates and metabolites        300  Cocaine and metabolites        300 THC                            50 Performed at [facility], [place] [address], [place], [place] [address]   Urinalysis, Routine w reflex microscopic -Urine, Clean Catch     Status: Abnormal   Collection Time: 05/19/22  5:22 PM  Result Value Ref Range   Color, Urine YELLOW YELLOW   APPearance CLEAR CLEAR   Specific Gravity, Urine >1.046 (H) 1.005 - 1.030   pH 6.0 5.0 - 8.0   Glucose, UA >=500 (A) NEGATIVE mg/dL   Hgb urine dipstick SMALL (A) NEGATIVE   Bilirubin Urine NEGATIVE NEGATIVE   Ketones, ur  NEGATIVE NEGATIVE mg/dL   Protein, ur NEGATIVE NEGATIVE mg/dL   Nitrite NEGATIVE NEGATIVE   Leukocytes,Ua TRACE (A) NEGATIVE   RBC / HPF 0-5 0 - 5 RBC/hpf   WBC, UA 11-20 0 - 5 WBC/hpf   Bacteria, UA RARE (A) NONE SEEN   Squamous Epithelial / HPF 0-5 0 - 5 /HPF    Comment: Performed at [facility], [address] [place], [place] [address]   CT ANGIO HEAD NECK W WO CM W PERF (CODE STROKE)  Result Date: 05/19/2022 CLINICAL DATA:  Left visual field deficit.  Left quadrantanopia. EXAM: CT ANGIOGRAPHY HEAD AND NECK CT PERFUSION BRAIN TECHNIQUE: Multidetector CT imaging of the head and neck was performed using the standard protocol during bolus administration of intravenous contrast. Multiplanar CT image reconstructions and MIPs were obtained to evaluate the vascular anatomy. Carotid stenosis measurements (when applicable) are obtained utilizing NASCET criteria, using the distal internal carotid diameter as the denominator. Multiphase CT imaging of the brain was performed following IV bolus contrast injection. Subsequent parametric perfusion maps were calculated using RAPID software. RADIATION DOSE REDUCTION: This exam was performed according to the departmental dose-optimization program which includes automated exposure control, adjustment of the mA and/or kV according to patient size and/or use of iterative reconstruction technique. CONTRAST:  165m OMNIPAQUE IOHEXOL 350 MG/ML SOLN COMPARISON:  None Available. FINDINGS: CTA NECK FINDINGS Aortic arch: Standard 3 vessel aortic arch. Wide patency of the brachiocephalic and subclavian arteries. Right carotid system: Patent without evidence of stenosis, dissection, or significant atherosclerosis. Left carotid system: Patent without evidence of stenosis, dissection, or significant atherosclerosis. Partially retropharyngeal course of the ICA. Vertebral arteries: Patent without evidence of stenosis, dissection, or significant atherosclerosis. Mildly limited  assessment of the left V1 segment due to venous contrast. Slightly dominant left vertebral artery. Skeleton: Minor cervical spondylosis. Other neck: No evidence of cervical lymphadenopathy or mass. Upper chest: Clear lung apices. Review of the MIP images confirms the above findings CTA HEAD FINDINGS Anterior circulation: The internal carotid arteries are widely patent from skull base to carotid termini. ACAs and MCAs are patent without evidence of a proximal branch occlusion or significant proximal stenosis. No aneurysm is identified. Posterior circulation: The intracranial vertebral arteries are widely patent to the basilar. Patent AICA and SCA origins are seen bilaterally. The basilar artery is widely patent. There are small posterior communicating arteries bilaterally. Both PCAs are patent without evidence of a significant proximal stenosis. No aneurysm is identified. Venous sinuses: Patent. Anatomic variants: None. Review of the MIP images confirms the above findings CT Brain Perfusion Findings: ASPECTS: 10 CBF (<30%) Volume: 0 mL Perfusion (Tmax>6.0s) volume: 0 mL Mismatch Volume: 0 mL Infarction Location: No infarct  identified by CTP. The preliminary finding of no LVO was communicated to Dr. Rory Percy at 3:05 pm on 05/19/2022 by text page via the [facility] messaging system. IMPRESSION: 1. No large vessel occlusion or significant stenosis in the head or neck. 2. Negative CTP. Electronically Signed   By: Logan Bores M.D.   On: 05/19/2022 15:20   CT HEAD CODE STROKE WO CONTRAST  Result Date: 05/19/2022 CLINICAL DATA:  Code stroke.  Left visual field deficit. EXAM: CT HEAD WITHOUT CONTRAST TECHNIQUE: Contiguous axial images were obtained from the base of the skull through the vertex without intravenous contrast. RADIATION DOSE REDUCTION: This exam was performed according to the departmental dose-optimization program which includes automated exposure control, adjustment of the mA and/or kV according to patient size  and/or use of iterative reconstruction technique. COMPARISON:  Head CT 11/25/2021 FINDINGS: Brain: There is no evidence of an acute infarct, intracranial hemorrhage, mass, midline shift, or extra-axial fluid collection. The ventricles and sulci are normal. Vascular: No hyperdense vessel. Skull: No acute fracture or suspicious osseous lesion. Asymmetric right TMJ arthropathy. Sinuses/Orbits: Visualized paranasal sinuses and mastoid air cells are clear. Bilateral cataract extraction. Other: None. ASPECTS ([place] Stroke Program Early CT Score) - Ganglionic level infarction (caudate, lentiform nuclei, internal capsule, insula, M1-M3 cortex): 7 - Supraganglionic infarction (M4-M6 cortex): 3 Total score (0-10 with 10 being normal): 10 These results were communicated to Dr. Rory Percy at 3:05 pm on 05/19/2022 by text page via the [facility] messaging system. IMPRESSION: Unremarkable CT appearance of the brain.  ASPECTS of 10. Electronically Signed   By: Logan Bores M.D.   On: 05/19/2022 15:05    Pending Labs Unresulted Labs (From admission, onward)     Start     Ordered   05/20/22 0500  Lipid panel  (Labs)  Tomorrow morning,   R       Comments: Fasting    05/19/22 1554   05/20/22 3235  Basic metabolic panel  Tomorrow morning,   R        05/19/22 1804   05/20/22 0500  CBC  Tomorrow morning,   R        05/19/22 1804   05/19/22 1600  APTT  Once,   STAT        05/19/22 1600   05/19/22 1600  Protime-INR  Once,   R        05/19/22 1600   05/19/22 1435  Ethanol  Once,   URGENT        05/19/22 1436   05/19/22 1429  CBC with Differential  Once,   STAT        05/19/22 1431            Vitals/Pain Today's Vitals   05/19/22 1330 05/19/22 1336 05/19/22 1545  BP: 124/68  116/63  Pulse: 75  78  Resp: 15  19  Temp: 97.9 F (36.6 C)    TempSrc: Oral    SpO2: 96%  96%  Weight:  83 kg   Height:  '5\' 3"'$  (1.6 m)   PainSc:  9      Isolation Precautions No active isolations  Medications Medications   stroke:  early stages of recovery book (has no administration in time range)  aspirin chewable tablet 81 mg (81 mg Oral Given 05/19/22 1640)  acetaminophen (TYLENOL) tablet 650 mg (has no administration in time range)    Or  acetaminophen (TYLENOL) suppository 650 mg (has no administration in time range)  methylPREDNISolone sodium succinate (SOLU-MEDROL) 40 mg/mL  injection 40 mg (40 mg Intravenous Given 05/19/22 1557)  diphenhydrAMINE (BENADRYL) capsule 50 mg ( Oral See Alternative 05/19/22 1444)    Or  diphenhydrAMINE (BENADRYL) injection 50 mg (50 mg Intravenous Given 05/19/22 1444)  iohexol (OMNIPAQUE) 350 MG/ML injection 100 mL (100 mLs Intravenous Contrast Given 05/19/22 1459)    Mobility walks     Focused Assessments Neuro Assessment Handoff:  Swallow screen pass? Yes    NIH Stroke Scale  Dizziness Present: No Headache Present: No Interval: Other (Comment) (q2 hours) Level of Consciousness (1a.)   : Alert, keenly responsive LOC Questions (1b. )   : Answers both questions correctly LOC Commands (1c. )   : Performs both tasks correctly Best Gaze (2. )  : Normal Visual (3. )  : No visual loss Facial Palsy (4. )    : Normal symmetrical movements Motor Arm, Left (5a. )   : No drift Motor Arm, Right (5b. ) : No drift Motor Leg, Left (6a. )  : Drift Motor Leg, Right (6b. ) : No drift Limb Ataxia (7. ): Absent Sensory (8. )  : Normal, no sensory loss Best Language (9. )  : No aphasia Dysarthria (10. ): Normal Extinction/Inattention (11.)   : No Abnormality Complete NIHSS TOTAL: 1 Last date known well: 05/19/22 Last time known well: 0700 Neuro Assessment: Exceptions to WDL Neuro Checks:   Initial (05/19/22 1445)  Has TPA been given? No If patient is a Neuro Trauma and patient is going to OR before floor call report to [place] nurse: [phone] or [phone]   R Recommendations: See Admitting Provider Note  Report given to:   Additional Notes: Patient A&Ox4. Unable to do MRI due  to new defibrillator placed earlier this week. Patient c/o of pain on the left side due to that. Patient walks.

## 2022-05-19 NOTE — Telephone Encounter (Signed)
Spoke with the patient who states that she woke up this morning and was dizzy. She had breakfast and went back to bed. When she got back up she was still dizzy. She states that she has been dizzy in the past but it was due to dehydration and she feels different. She has drank at least two bottles of water today. She reports BP of 132/82 and HR 81. She tried to send in a remote transmission on her device but it did not go through.  She states that she is having blurry vision. She is unable to see part of her hand when looking at it and unable to see full screen and words on the TV. She is also having shortness of breath. Advised patient that due to her symptoms, recent procedure and being off of Eliquis currently that she needs to be evaluated in the ER. She is currently at home by herself. I have advised patient to sit down and stay put and I will call 911 for her.  911 has been dispatched, patient is aware that they are on the way.

## 2022-05-19 NOTE — ED Provider Notes (Signed)
[facility] Provider Note   CSN: [id number] Arrival date & time: 05/19/22  1323     History  Chief Complaint  Patient presents with   Loss of Vision    Peripheral vision.    Paula Booker is a 77 y.o. female. With past medical history of NICM with ICD, CHF, GERD, HTN, WPW who presents to the emergency department with vision loss.   Patient states this morning she got up to do her normal routine.  She states that she sat down to watch TV around 7 AM and suddenly had left-sided peripheral vision loss.  She states that afterward she began to feel dizzy and decided to lay down.  She states that when she woke up she felt worsening dizziness and so to call EMS.  Notably, she had an AICD replacement on Monday, 05/16/2022.  She states that she stopped taking her Eliquis last Friday in anticipation of this procedure and is not supposed to restart it until Saturday. She denies having headache or neck pain, numbness or tingling or weakness to her face or extremities, slurred speech, confusion.  She denies having any recent head or neck trauma.  HPI     Home Medications Prior to Admission medications   Medication Sig Start Date End Date Taking? Authorizing Provider  acetaminophen (TYLENOL) 500 MG tablet Take 500 mg by mouth in the morning and at bedtime.    [provider]  albuterol (VENTOLIN HFA) 108 (90 Base) MCG/ACT inhaler Inhale 2 puffs into the lungs every 6 (six) hours as needed for wheezing. For wheezing. Use 2 puffs 3 times daily x5 days and then every 6 hours as needed. 01/14/20   Mannam, Hart Robinsons, MD  alclomethasone (ACLOVATE) 0.05 % cream Apply 1 Application topically 2 (two) times daily as needed (rash). 09/13/21   [provider]  apixaban Arne Cleveland) 5 MG TABS tablet Take 1 tablet by mouth twice daily 03/22/22   Evans Lance, MD  Calcium Carb-Cholecalciferol 600-20 MG-MCG TABS Take 1 tablet by mouth in the morning.     [provider]  carvedilol (COREG) 6.25 MG tablet Take 1 tablet (6.25 mg total) by mouth 2 (two) times daily. 06/04/21   Evans Lance, MD  cetirizine (ZYRTEC) 10 MG tablet Take 10 mg by mouth in the morning.    [provider]  Cholecalciferol (VITAMIN D3) 5000 units CAPS Take 5,000 Units by mouth in the morning.    [provider]  Collagen-Vitamin C (COLLAGEN PLUS VITAMIN C PO) Take 1 tablet by mouth in the morning.    [provider]  Cyanocobalamin (VITAMIN B-12) 5000 MCG LOZG Take 5,000 mcg by mouth in the morning.    [provider]  ENTRESTO 24-26 MG Take 1 tablet by mouth twice daily 03/18/22   Evans Lance, MD  escitalopram (LEXAPRO) 20 MG tablet Take 20 mg by mouth in the morning. 10/23/20   [provider]  fluticasone furoate-vilanterol (BREO ELLIPTA) 200-25 MCG/INH AEPB Inhale 1 puff by mouth once daily Patient taking differently: Inhale 1 puff into the lungs daily as needed (shortness of breath). 06/11/20   Mannam, Hart Robinsons, MD  furosemide (LASIX) 40 MG tablet TAKE 1 TO 2 TABLETS BY MOUTH AS DIRECTED ONCE DAILY FOR  EDEMA,  SHORTNESS  OF  BREATH  OR  WEIGHT  GAIN Patient taking differently: Take 40 mg by mouth 2 (two) times daily. 11/25/21   Evans Lance, MD  ipratropium-albuterol (DUONEB)  0.5-2.5 (3) MG/3ML SOLN Take 3 mLs by nebulization every 4 (four) hours as needed. 10/09/17   Lauraine Rinne, NP  JARDIANCE 10 MG TABS tablet TAKE 1 TABLET BY MOUTH ONCE DAILY BEFORE BREAKFAST 03/31/22   Evans Lance, MD  Multiple Vitamin (MULTIVITAMIN WITH MINERALS) TABS tablet Take 1 tablet by mouth in the morning.    [provider]  pantoprazole (PROTONIX) 40 MG tablet Take 40 mg by mouth daily before breakfast. 08/29/17   [provider]  rosuvastatin (CRESTOR) 5 MG tablet Take 5 mg by mouth at bedtime. 11/18/20   [provider]  spironolactone (ALDACTONE) 25 MG tablet Take 1 tablet (25 mg total) by mouth daily.  08/19/21   Jettie Booze, MD      Allergies    Ivp dye [iodinated contrast media], Penicillins, Sulfonamide derivatives, Tessalon [benzonatate], Sulfa antibiotics, Rosuvastatin calcium, and Onion    Review of Systems   Review of Systems  Eyes:  Positive for visual disturbance.  All other systems reviewed and are negative.   Physical Exam Updated Vital Signs BP 124/68 (BP Location: Right Arm)   Pulse 75   Temp 97.9 F (36.6 C) (Oral)   Resp 15   Ht '5\' 3"'$  (1.6 m)   Wt 83 kg   SpO2 96%   BMI 32.42 kg/m  Physical Exam Vitals and nursing note reviewed.  Constitutional:      General: She is not in acute distress.    Appearance: Normal appearance. She is not ill-appearing or toxic-appearing.  HENT:     Head: Normocephalic and atraumatic.     Mouth/Throat:     Mouth: Mucous membranes are moist.     Pharynx: Oropharynx is clear.  Eyes:     General: Visual field deficit present. No scleral icterus.    Extraocular Movements: Extraocular movements intact.     Pupils: Pupils are equal, round, and reactive to light.  Cardiovascular:     Rate and Rhythm: Normal rate and regular rhythm.     Pulses: Normal pulses.     Heart sounds: No murmur heard. Pulmonary:     Effort: Pulmonary effort is normal. No respiratory distress.     Breath sounds: Normal breath sounds.  Abdominal:     General: Bowel sounds are normal.     Palpations: Abdomen is soft.  Musculoskeletal:     Cervical back: Normal range of motion.  Skin:    General: Skin is warm and dry.     Capillary Refill: Capillary refill takes less than 2 seconds.  Neurological:     Mental Status: She is alert and oriented to person, place, and time.     Cranial Nerves: No dysarthria or facial asymmetry.     Sensory: Sensation is intact.     Motor: Motor function is intact. No pronator drift.     Coordination: Finger-Nose-Finger Test and Heel to [employer] normal.     Comments: Lateral left visual field deficit     ED  Results / Procedures / Treatments   Labs (all labs ordered are listed, but only abnormal results are displayed) Labs Reviewed  COMPREHENSIVE METABOLIC PANEL  CBC WITH DIFFERENTIAL/PLATELET  ETHANOL  PROTIME-INR  APTT  RAPID URINE DRUG SCREEN, HOSP PERFORMED  URINALYSIS, ROUTINE W REFLEX MICROSCOPIC  CBG MONITORING, ED  I-STAT CHEM 8, ED    EKG None  Radiology No results found.  Procedures Procedures   Medications Ordered in ED Medications  methylPREDNISolone sodium succinate (SOLU-MEDROL) 40 mg/mL injection  40 mg (has no administration in time range)  diphenhydrAMINE (BENADRYL) capsule 50 mg (has no administration in time range)    Or  diphenhydrAMINE (BENADRYL) injection 50 mg (has no administration in time range)  iohexol (OMNIPAQUE) 350 MG/ML injection 100 mL (100 mLs Intravenous Contrast Given 05/19/22 1459)    ED Course/ Medical Decision Making/ A&P Clinical Course as of 05/19/22 1509  Thu May 19, 2022  1435 Consult and spoke with Dr. Rory Percy who recommends calling code stroke for LVO. This has been done. He will come see patient at bedside.  [LA]  1457 Lkk 7am sudden left side peripheral vision lossand dizziness . Then went to bed. Woke up even more dizzy. Recent AICD replacement for WPW, cardiomyopathy, pAF and sudden arrythmia death syndrome, has not been taking eliquis since Friday for procedure. Code stroke called for LVO. [JS]    Clinical Course User Index [JS] Donzetta Matters, MD [LA] Mickie Hillier, PA-C   Care of patient being handed off to Dr. Nicole Kindred, ED resident at this time. Patient currently in CT with Dr. Rory Percy for CTA Code stroke scan. Please see his note for completion of care.  Final Clinical Impression(s) / ED Diagnoses Final diagnoses:  None    Rx / DC Orders ED Discharge Orders     None         Mickie Hillier, PA-C 05/19/22 1510    Valarie Merino, MD 05/19/22 1606

## 2022-05-19 NOTE — Hospital Course (Addendum)
Principal Problem:   Cerebral infarction [facility]) Active Problems:   NICM (nonischemic cardiomyopathy) ([place])   Essential hypertension   Pacemaker   OSA (obstructive sleep apnea)   Paroxysmal atrial fibrillation (HCC)   Infection of pacemaker pocket [facility])  Resolved Problems:   * No resolved hospital problems. *  Consults: - Neuro  Procedures:***  Follow-up items: - MRI abdomen - MRI brain around 06/30/22

## 2022-05-19 NOTE — Consult Note (Addendum)
Neurology Consultation  Reason for Consult: Code stroke- Vision loss Referring Physician: Dr. Sabra Heck   CC: peripheral vision loss  History is obtained from:patient and medical record   HPI: Paula Booker is a 77 y.o. female with past medical history of PAF on Eliquis, HTN, CHF, s/p AICD, WPW, migraines, OSA not on CPAP, anxiety and depression who presents to [facility] for evaluation of peripheral vision loss and dizziness. LKW 0700.  Per patient she woke this am '@0545'$  in her normal state of health, around 0700 she had sudden onset of peripheral vision loss and dizziness. Symptoms have persisted throughout the day. While in CT she stated her vision was improving, however still having some vision loss in her periphery. She had AICD implanted on 2/5 and has been off her Eliquis since Friday. She called her cardiologist this morning and they recommended her to come to the ER for evaluation. Code stoke was activated in the ED. Code stroke CT head with no acute process and ASPECTS 10. CT angio head and neck with Perf negative for LVO.  NIHSS 3.    LKW: 0700 IV thrombolysis given?: no, outside of window EVT:  No LVO Premorbid modified Rankin scale (mRS):  0-Completely asymptomatic and back to baseline post-stroke  ROS: Full ROS was performed and is negative except as noted in the HPI.   Past Medical History:  Diagnosis Date   AICD (automatic cardioverter/defibrillator) present    Anemia    as a child   Anxiety    Arthritis    "hands and knees" (03/22/2012)   Asthma    Basal cell carcinoma 04/27/2011   mid forehead([facility])   Bursitis    CHF (congestive heart failure) ([place]) 09/2011   pt. denies   Depression    Dysrhythmia    WPW   GERD (gastroesophageal reflux disease)    Head injury, acute, with loss of consciousness ([place])    History of hiatal hernia    Hypertension    ICD (implantable cardiac defibrillator) in place 03/2012   Migraines    migraines    Nausea vomiting and diarrhea  03/18/2015   NICM (nonischemic cardiomyopathy) (HCC)    Obesity (BMI 30-39.9)    negative sleep study 2014   OSA (obstructive sleep apnea) 05/27/2018   Mild with AHI 6/hr    no cpap   Pneumonia    hx   Shortness of breath    "@ any time I can get SOB" (03/22/2012)   Skin cancer 1999   Squamous cell carcinoma of skin 07/08/2020   in situ right malar cheek-[id number]   Sudden arrhythmia death syndrome    WPW (Wolff-Parkinson-White syndrome)      Family History  Problem Relation Age of Onset   Congestive Heart Failure Mother        died 30   Kidney Stones Mother    Deafness Mother    Atrial fibrillation Mother    Congestive Heart Failure Father    Deafness Father    Healthy Sister    Healthy Sister    Healthy Sister    Congestive Heart Failure Brother    Atrial fibrillation Brother    Tuberculosis Paternal Grandfather    Migraines Neg Hx    Neural tube defect Neg Hx      Social History:   reports that she has never smoked. She has never used smokeless tobacco. She reports that she does not currently use alcohol. She reports that she does not use drugs.  Medications  Current Facility-Administered Medications:    diphenhydrAMINE (BENADRYL) capsule 50 mg, 50 mg, Oral, Once **OR** diphenhydrAMINE (BENADRYL) injection 50 mg, 50 mg, Intravenous, Once, Autry, Lauren E, PA-C   methylPREDNISolone sodium succinate (SOLU-MEDROL) 40 mg/mL injection 40 mg, 40 mg, Intravenous, Once, Mickie Hillier, PA-C  Current Outpatient Medications:    acetaminophen (TYLENOL) 500 MG tablet, Take 500 mg by mouth in the morning and at bedtime., Disp: , Rfl:    albuterol (VENTOLIN HFA) 108 (90 Base) MCG/ACT inhaler, Inhale 2 puffs into the lungs every 6 (six) hours as needed for wheezing. For wheezing. Use 2 puffs 3 times daily x5 days and then every 6 hours as needed., Disp: 18 g, Rfl: 5   alclomethasone (ACLOVATE) 0.05 % cream, Apply 1 Application topically 2 (two) times daily as needed (rash)., Disp:  , Rfl:    apixaban (ELIQUIS) 5 MG TABS tablet, Take 1 tablet by mouth twice daily, Disp: 180 tablet, Rfl: 1   Calcium Carb-Cholecalciferol 600-20 MG-MCG TABS, Take 1 tablet by mouth in the morning., Disp: , Rfl:    carvedilol (COREG) 6.25 MG tablet, Take 1 tablet (6.25 mg total) by mouth 2 (two) times daily., Disp: 180 tablet, Rfl: 3   cetirizine (ZYRTEC) 10 MG tablet, Take 10 mg by mouth in the morning., Disp: , Rfl:    Cholecalciferol (VITAMIN D3) 5000 units CAPS, Take 5,000 Units by mouth in the morning., Disp: , Rfl:    Collagen-Vitamin C (COLLAGEN PLUS VITAMIN C PO), Take 1 tablet by mouth in the morning., Disp: , Rfl:    Cyanocobalamin (VITAMIN B-12) 5000 MCG LOZG, Take 5,000 mcg by mouth in the morning., Disp: , Rfl:    ENTRESTO 24-26 MG, Take 1 tablet by mouth twice daily, Disp: 180 tablet, Rfl: 0   escitalopram (LEXAPRO) 20 MG tablet, Take 20 mg by mouth in the morning., Disp: , Rfl:    fluticasone furoate-vilanterol (BREO ELLIPTA) 200-25 MCG/INH AEPB, Inhale 1 puff by mouth once daily (Patient taking differently: Inhale 1 puff into the lungs daily as needed (shortness of breath).), Disp: 60 each, Rfl: 5   furosemide (LASIX) 40 MG tablet, TAKE 1 TO 2 TABLETS BY MOUTH AS DIRECTED ONCE DAILY FOR  EDEMA,  SHORTNESS  OF  BREATH  OR  WEIGHT  GAIN (Patient taking differently: Take 40 mg by mouth 2 (two) times daily.), Disp: 180 tablet, Rfl: 1   ipratropium-albuterol (DUONEB) 0.5-2.5 (3) MG/3ML SOLN, Take 3 mLs by nebulization every 4 (four) hours as needed., Disp: 360 mL, Rfl: 1   JARDIANCE 10 MG TABS tablet, TAKE 1 TABLET BY MOUTH ONCE DAILY BEFORE BREAKFAST, Disp: 90 tablet, Rfl: 0   Multiple Vitamin (MULTIVITAMIN WITH MINERALS) TABS tablet, Take 1 tablet by mouth in the morning., Disp: , Rfl:    pantoprazole (PROTONIX) 40 MG tablet, Take 40 mg by mouth daily before breakfast., Disp: , Rfl: 0   rosuvastatin (CRESTOR) 5 MG tablet, Take 5 mg by mouth at bedtime., Disp: , Rfl:    spironolactone  (ALDACTONE) 25 MG tablet, Take 1 tablet (25 mg total) by mouth daily., Disp: 90 tablet, Rfl: 3   Exam: Current vital signs: BP 124/68 (BP Location: Right Arm)   Pulse 75   Temp 97.9 F (36.6 C) (Oral)   Resp 15   Ht '5\' 3"'$  (1.6 m)   Wt 83 kg   SpO2 96%   BMI 32.42 kg/m  Vital signs in last 24 hours: Temp:  [97.9 F (36.6 C)] 97.9 F (36.6 C) (  02/08 1330) Pulse Rate:  [75] 75 (02/08 1330) Resp:  [15] 15 (02/08 1330) BP: (124)/(68) 124/68 (02/08 1330) SpO2:  [96 %] 96 % (02/08 1330) Weight:  [83 kg] 83 kg (02/08 1336)  GENERAL: Awake, alert in NAD HEENT: - Normocephalic and atraumatic, dry mm LUNGS - Clear to auscultation bilaterally with no wheezes CV - S1S2 RRR, no m/r/g, equal pulses bilaterally. ABDOMEN - Soft, nontender, nondistended with normoactive BS Ext: warm, well perfused, intact peripheral pulses, no edema  NEURO:  Mental Status: AA&Ox4 Language: speech is clear.  Naming, repetition, fluency, and comprehension intact. Cranial Nerves: PERRL EOMI, visual fields with left upper quadrant field cut, no facial asymmetry, facial sensation intact, hearing intact, tongue/uvula/soft palate midline, normal sternocleidomastoid and trapezius muscle strength. No evidence of tongue atrophy or fibrillations Motor: bilaterally uppers 5/5, left grip 4/5, right lower 5/5, left lower 5/5 with drift  Tone: is normal and bulk is normal Sensation- decreased sensation on left  Coordination: FTN intact bilaterally, no ataxia in BLE. Gait- deferred  NIHSS 1a Level of Conscious.: 0 1b LOC Questions: 0 1c LOC Commands: 0 2 Best Gaze: 0 3 Visual: 1 4 Facial Palsy: 0 5a Motor Arm - left: 0 5b Motor Arm - Right: 0 6a Motor Leg - Left: 1 6b Motor Leg - Right: 0 7 Limb Ataxia: 0 8 Sensory: 1 9 Best Language: 0 10 Dysarthria: 0 11 Extinct. and Inatten.: 0 TOTAL: 3   Labs I have reviewed labs in epic and the results pertinent to this consultation are:  CBC    Component Value  Date/Time   WBC 8.4 04/25/2022 1411   WBC 13.9 (H) 11/25/2021 1404   RBC 4.60 04/25/2022 1411   RBC 4.53 11/25/2021 1404   HGB 14.6 04/25/2022 1411   HCT 43.2 04/25/2022 1411   PLT 213 04/25/2022 1411   MCV 94 04/25/2022 1411   MCH 31.7 04/25/2022 1411   MCH 31.3 11/25/2021 1404   MCHC 33.8 04/25/2022 1411   MCHC 32.9 11/25/2021 1404   RDW 12.3 04/25/2022 1411   LYMPHSABS 1.7 11/25/2021 1404   MONOABS 1.0 11/25/2021 1404   EOSABS 0.0 11/25/2021 1404   BASOSABS 0.0 11/25/2021 1404    CMP     Component Value Date/Time   NA 142 04/25/2022 1411   K 4.4 04/25/2022 1411   CL 103 04/25/2022 1411   CO2 27 04/25/2022 1411   GLUCOSE 129 (H) 04/25/2022 1411   GLUCOSE 121 (H) 11/25/2021 1404   BUN 22 04/25/2022 1411   CREATININE 0.84 04/25/2022 1411   CALCIUM 9.9 04/25/2022 1411   PROT 6.7 11/25/2021 1404   PROT 6.8 04/26/2019 0845   ALBUMIN 4.0 11/25/2021 1404   ALBUMIN 4.2 04/26/2019 0845   AST 21 11/25/2021 1404   ALT 17 11/25/2021 1404   ALKPHOS 67 11/25/2021 1404   BILITOT 0.5 11/25/2021 1404   BILITOT 0.3 04/26/2019 0845   GFRNONAA 57 (L) 11/25/2021 1404   GFRAA >60 12/31/2019 1748    Lipid Panel     Component Value Date/Time   CHOL 186 04/26/2019 0845   TRIG 122 04/26/2019 0845   HDL 43 04/26/2019 0845   CHOLHDL 4.3 04/26/2019 0845   LDLCALC 121 (H) 04/26/2019 0845     Imaging I have reviewed the images obtained:  CT-head negative for acute process, ASPECTS 10   CTA head and neck with perf-  No large vessel occlusion or significant stenosis in the head or neck. Negative CTP.  Assessment:  77 y.o.  female with past medical history of PAF on Eliquis, HTN, CHF, s/p AICD, WPW, migraines, OSA not on CPAP, anxiety and depression who presents to [facility] for evaluation of peripheral vision loss and dizziness. LKW 0700.   Exam consistent with mild left-sided weakness and left superior quadrantanopsia  Suspect small acute right posterior cerebral artery stroke in the  setting of P AFib currently not on Eliquis due to recent AICD placement   Recommendations: - admit to medicine for stroke workup - HgbA1c, fasting lipid panel - MRI of the brain without contrast- (unsure if this is able to be done due to recent AICD placement. Will need to refer to cards) - If unable to have MRI brain will recheck CT head in 24 hours to evaluate for acute stroke  - bedside swallow screen. If passes can have diet  - Frequent neuro checks - Echocardiogram - Restart Eliquis when OK with cardiology  - Prophylactic therapy-Antiplatelet med: Aspirin - dose '81mg'$  until Eliquis can be restarted per cards - Risk factor modification - Telemetry monitoring - PT consult, OT consult, Speech consult - Stroke team to follow  Beulah Gandy DNP, [place]  [facility]  Attending Neurohospitalist Addendum Patient seen and examined with APP/Resident. Agree with the history and physical as documented above. Agree with the plan as documented, which I helped formulate. I have independently reviewed the chart, obtained history, review of systems and examined the patient.I have personally reviewed pertinent head/neck/spine imaging (CT/MRI). Please feel free to call with any questions.  -- Amie Portland, MD Neurologist [facility] Pager: [phone]

## 2022-05-19 NOTE — ED Triage Notes (Signed)
Patient BIB EMS via home for loss of peripheral vision. Patient states it seems she is having more trouble with the left side. Patient states this started this morning at 0700. Patient states she did nothing for this to happen. Patient denies N/V/D. Patient is A&Ox4.

## 2022-05-20 ENCOUNTER — Other Ambulatory Visit (HOSPITAL_COMMUNITY): Payer: Self-pay

## 2022-05-20 ENCOUNTER — Observation Stay (HOSPITAL_BASED_OUTPATIENT_CLINIC_OR_DEPARTMENT_OTHER): Payer: Medicare HMO

## 2022-05-20 ENCOUNTER — Observation Stay (HOSPITAL_COMMUNITY): Payer: Medicare HMO

## 2022-05-20 DIAGNOSIS — I639 Cerebral infarction, unspecified: Secondary | ICD-10-CM | POA: Diagnosis not present

## 2022-05-20 DIAGNOSIS — R29818 Other symptoms and signs involving the nervous system: Secondary | ICD-10-CM | POA: Diagnosis not present

## 2022-05-20 DIAGNOSIS — I6389 Other cerebral infarction: Secondary | ICD-10-CM | POA: Diagnosis not present

## 2022-05-20 DIAGNOSIS — T827XXA Infection and inflammatory reaction due to other cardiac and vascular devices, implants and grafts, initial encounter: Secondary | ICD-10-CM | POA: Diagnosis present

## 2022-05-20 LAB — LIPID PANEL
Cholesterol: 163 mg/dL (ref 0–200)
HDL: 54 mg/dL (ref >40)
LDL Cholesterol: 91 mg/dL (ref 0–99)
Total CHOL/HDL Ratio: 3 RATIO
Triglycerides: 91 mg/dL (ref <150)
VLDL: 18 mg/dL (ref 0–40)

## 2022-05-20 LAB — BASIC METABOLIC PANEL
Anion gap: 12 (ref 5–15)
BUN: 19 mg/dL (ref 8–23)
CO2: 24 mmol/L (ref 22–32)
Calcium: 9.4 mg/dL (ref 8.9–10.3)
Chloride: 99 mmol/L (ref 98–111)
Creatinine, Ser: 0.9 mg/dL (ref 0.44–1.00)
GFR, Estimated: >60 mL/min (ref >60)
Glucose, Bld: 158 mg/dL — ABNORMAL HIGH (ref 70–99)
Potassium: 4.1 mmol/L (ref 3.5–5.1)
Sodium: 135 mmol/L (ref 135–145)

## 2022-05-20 LAB — CBC
HCT: 41.8 % (ref 36.0–46.0)
Hemoglobin: 14.4 g/dL (ref 12.0–15.0)
MCH: 31.8 pg (ref 26.0–34.0)
MCHC: 34.4 g/dL (ref 30.0–36.0)
MCV: 92.3 fL (ref 80.0–100.0)
Platelets: 197 10*3/uL (ref 150–400)
RBC: 4.53 MIL/uL (ref 3.87–5.11)
RDW: 12.2 % (ref 11.5–15.5)
WBC: 7.3 10*3/uL (ref 4.0–10.5)
nRBC: 0 % (ref 0.0–0.2)

## 2022-05-20 LAB — ECHOCARDIOGRAM COMPLETE
AV Mean grad: 3.5 mmHg
AV Peak grad: 6.3 mmHg
Ao pk vel: 1.26 m/s
Area-P 1/2: 4.06 cm2
Calc EF: 37.2 %
Height: 63 in
Single Plane A2C EF: 32.6 %
Single Plane A4C EF: 40.6 %
Weight: 2955.93 oz

## 2022-05-20 MED ORDER — CLINDAMYCIN HCL 300 MG PO CAPS
300.0000 mg | ORAL_CAPSULE | Freq: Four times a day (QID) | ORAL | 0 refills | Status: DC
  Filled 2022-05-20: qty 24, 6d supply, fill #0

## 2022-05-20 MED ORDER — ROSUVASTATIN CALCIUM 5 MG PO TABS: 5.0000 mg | ORAL_TABLET | Freq: Every day | ORAL | Status: DC

## 2022-05-20 MED ORDER — APIXABAN 5 MG PO TABS
5.0000 mg | ORAL_TABLET | Freq: Two times a day (BID) | ORAL | Status: DC
  Administered 2022-05-20: 5 mg via ORAL
  Filled 2022-05-20: qty 1

## 2022-05-20 NOTE — Evaluation (Signed)
Occupational Therapy Evaluation Patient Details Name: Paula Booker MRN: [id number] DOB: Jul 13, 1945 Today's Date: 05/20/2022   History of Present Illness Pt is 77 yo female presenting via EMS due to loss of peripheral vision and dizziness. L side effected more than R side. PMH: AICD, Anemia, anxiety, arthritis, asthma, casal cell cardinoma, bursitis, CHF, depression dysrhythmia, GERD, head injury, hiatl hernia, HTN, ICD, migraines, NICM, OSA, squamous cell cardinoma of skin, sudden arrhythmia death syndrome, WPW   Clinical Impression   Prior to this admission, patient living alone, and fully independent in ADLs and IADLs. 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 Patient has not been driving recently due to pacemaker placement, however her grandson lives across the street and has additional family close by. Upon OT evaluation, patient is back at her baseline, with the exception of patient reporting increased "migraine like floaters" across her vision, however no longer any issues reported or noticed with peripheral vision or visual field. Patient educated on BEFAST acronym for strokes, as well as not to drive till cleared by neurology. Patient requiring no OT follow up at this time, with OT signing off. Patient encouraged to work with mobility while in the hospital to maintain current level of function.      Recommendations for follow up therapy are one component of a multi-disciplinary discharge planning process, led by the attending physician.  Recommendations may be updated based on patient status, additional functional criteria and insurance authorization.   Follow Up Recommendations  No OT follow up     Assistance Recommended at Discharge PRN (Assitance with transportation)  Patient can return home with the following Assist for transportation    Functional Status Assessment  Patient has not had a recent decline in their functional status  Equipment Recommendations   None recommended by OT    Recommendations for Other Services       Precautions / Restrictions Precautions Precautions: Fall Restrictions Weight Bearing Restrictions: No Other Position/Activity Restrictions: Patient with recent pacemaker, very knowledgeable about precautions      Mobility Bed Mobility Overal bed mobility: Independent                  Transfers Overall transfer level: Independent                 General transfer comment: able to walk in hallway with no LOB, able to complete divided attention tasks, and no reported dizziness      Balance Overall balance assessment: Independent                                         ADL either performed or assessed with clinical judgement   ADL Overall ADL's : At baseline                                             Vision Baseline Vision/History: 1 Wears glasses (Readers and cataracts surgery) Ability to See in Adequate Light: 1 Impaired Patient Visual Report: Other (comment) (Patient reporting increased "migraine like" floaters across her vision) Vision Assessment?: Yes Eye Alignment: Within Functional Limits Ocular Range of Motion: Within Functional Limits Alignment/Gaze Preference: Within Defined Limits Tracking/Visual Pursuits: Able to track stimulus in all quads without difficulty Convergence: Within functional limits Visual Fields: No apparent  deficits Additional Comments: Patient reporting increased "migraine like floaters" across her vision, however no longer any issues reported or noticed with peripheral vision or visual field     Perception     Praxis      Pertinent Vitals/Pain Pain Assessment Pain Assessment: No/denies pain     Hand Dominance Right   Extremity/Trunk Assessment Upper Extremity Assessment Upper Extremity Assessment: Overall WFL for tasks assessed   Lower Extremity Assessment Lower Extremity Assessment: Overall WFL for tasks  assessed   Cervical / Trunk Assessment Cervical / Trunk Assessment: Normal   Communication Communication Communication: No difficulties   Cognition Arousal/Alertness: Awake/alert Behavior During Therapy: WFL for tasks assessed/performed Overall Cognitive Status: Within Functional Limits for tasks assessed                                 General Comments: Very alert and motivated to return home, excellent recall and able to complete divided attention tasks in the hallway     General Comments  Educated on BEFAST, advised not to drive until cleared by nuerology    Exercises     Shoulder Instructions      Home Living   Living Arrangements: Alone Available Help at Discharge: Family;Available 24 hours/day Type of Home: House Home Access: Stairs to enter [employer] of Steps: 4 Entrance Stairs-Rails: Right Home Layout: Multi-level;Full bath on main level;Able to live on main level with bedroom/bathroom     Bathroom Shower/Tub: Teacher, early years/pre: Handicapped height     Home Equipment: None          Prior Functioning/Environment Prior Level of Function : Independent/Modified Independent;Driving             Mobility Comments: independent ADLs Comments: independent, avid quilter, able to complete all IADLs        OT Problem List: Decreased activity tolerance      OT Treatment/Interventions:      OT Goals(Current goals can be found in the care plan section) Acute Rehab OT Goals Patient Stated Goal: to go home OT Goal Formulation: With patient Time For Goal Achievement: 06/03/22 Potential to Achieve Goals: Good  OT Frequency:      Co-evaluation              AM-PAC OT "6 Clicks" Daily Activity     Outcome Measure Help from another person eating meals?: None Help from another person taking care of personal grooming?: None Help from another person toileting, which includes using toliet, bedpan, or urinal?:  None Help from another person bathing (including washing, rinsing, drying)?: None Help from another person to put on and taking off regular upper body clothing?: None Help from another person to put on and taking off regular lower body clothing?: None 6 Click Score: 24   End of Session Nurse Communication: Mobility status  Activity Tolerance: Patient tolerated treatment well Patient left: in chair;with call bell/phone within reach  OT Visit Diagnosis: Unsteadiness on feet (R26.81)                Time: [id number] OT Time Calculation (min): 35 min Charges:  OT General Charges $OT Visit: 1 Visit OT Evaluation $OT Eval Moderate Complexity: 1 Mod OT Treatments $Self Care/Home Management : 8-22 mins  Corinne Ports E. Marika Mahaffy, OTR/L Acute Rehabilitation Services [phone]   Ascencion Dike 05/20/2022, 10:23 AM

## 2022-05-20 NOTE — Progress Notes (Signed)
PT Cancellation Note  Patient Details Name: Paula Booker MRN: [id number] DOB: 08/19/1945   Cancelled Treatment:    Reason Eval/Treat Not Completed: OT screened, no needs identified, will sign off. If further needs arise please re-consult.   Tomma Rakers, DPT, [place]  Acute Rehabilitation Services Office: [phone] (Secure chat preferred)   Ander Purpura 05/20/2022, 10:56 AM

## 2022-05-20 NOTE — Evaluation (Signed)
Speech Language Pathology Evaluation Patient Details Name: TAJAHNAE REISDORF MRN: [id number] DOB: 1945-06-25 Today's Date: 05/20/2022 Time: [id number] SLP Time Calculation (min) (ACUTE ONLY): 12 min  Problem List:  Patient Active Problem List   Diagnosis Date Noted   Infection of pacemaker pocket ([place]) 05/20/2022   Cerebral infarction ([place]) 05/19/2022   Acute gout involving toe of right foot 11/25/2021   Age-related osteoporosis without current pathological fracture 11/25/2021   Basal cell carcinoma 11/25/2021   Chronic pain 11/25/2021   Collapsed vertebra, not elsewhere classified, site unspecified, subsequent encounter for fracture with routine healing 11/25/2021   Congestive heart failure ([place]) 11/25/2021   Exacerbation of asthma 11/25/2021   Hardening of the aorta (main artery of the heart) ([place]) 11/25/2021   Hypercoagulable state ([place]) 11/25/2021   Gout 11/25/2021   Intestinal malabsorption 11/25/2021   Mild intermittent asthma 11/25/2021   Mixed hyperlipidemia 11/25/2021   Moderate recurrent major depression ([place]) 11/25/2021   Primary insomnia 11/25/2021   Seasonal allergic rhinitis 11/25/2021   Severe recurrent major depression without psychotic features ([place]) 11/25/2021   Unspecified cirrhosis of liver ([place]) 11/25/2021   Osteoarthritis of knee 11/25/2021   Paroxysmal atrial fibrillation ([place]) 08/20/2019   Acute medial meniscus tear of right knee 08/09/2019   Chondromalacia, right knee 08/09/2019   SVT (supraventricular tachycardia) 11/02/2018   ICD (implantable cardioverter-defibrillator) in place 11/02/2018   OSA (obstructive sleep apnea) 05/27/2018   Acute medial meniscus tear, left, initial encounter 11/29/2017   Chondromalacia of left knee 11/29/2017   Migraines 09/23/2017   Anxiety 09/23/2017   Depression 09/23/2017   Pacemaker [id number]   Chronic systolic heart failure ([place]) 04/28/2015   Diarrhea 03/18/2015   CAP (community acquired pneumonia) 03/18/2015    Nausea vomiting and diarrhea 03/18/2015   Compression fracture 06/05/2014   Essential hypertension 03/27/2014   Chest pain 01/27/2014   Normal coronary arteries 01/27/2014   Obesity (BMI 30-39.9)    ICD- MDT implanted Dec 2013 03/26/2013   NICM (nonischemic cardiomyopathy) ([place]) 02/21/2013   GERD (gastroesophageal reflux disease) 04/23/2012   Asthma in adult, mild persistent, uncomplicated [id number]   Shortness of breath 04/23/2012   Syncope 03/01/2012   Acute on chronic systolic CHF (congestive heart failure) ([place]) 03/01/2012   Type B WPW syndrome- RFA Dec 2013 03/01/2012   Past Medical History:  Past Medical History:  Diagnosis Date   AICD (automatic cardioverter/defibrillator) present    Anemia    as a child   Anxiety    Arthritis    "hands and knees" (03/22/2012)   Asthma    Basal cell carcinoma 04/27/2011   mid forehead(MOHS)   Bursitis    CHF (congestive heart failure) ([place]) 09/2011   pt. denies   Depression    Dysrhythmia    WPW   GERD (gastroesophageal reflux disease)    Head injury, acute, with loss of consciousness ([place])    History of hiatal hernia    Hypertension    ICD (implantable cardiac defibrillator) in place 03/2012   Migraines    migraines    Nausea vomiting and diarrhea 03/18/2015   NICM (nonischemic cardiomyopathy) ([place])    Obesity (BMI 30-39.9)    negative sleep study 2014   OSA (obstructive sleep apnea) 05/27/2018   Mild with AHI 6/hr    no cpap   Pneumonia    hx   Shortness of breath    "@ any time I can get SOB" (03/22/2012)   Skin cancer 1999   Squamous cell carcinoma of skin 07/08/2020  in situ right malar cheek-[id number]   Sudden arrhythmia death syndrome    WPW (Wolff-Parkinson-White syndrome)    Past Surgical History:  Past Surgical History:  Procedure Laterality Date   BIOPSY  09/07/2021   Procedure: BIOPSY;  Surgeon: Otis Brace, MD;  Location: [facility];  Service: Gastroenterology;;   Zollie Beckers N/A 05/16/2022    Procedure: BIV [place];  Surgeon: Evans Lance, MD;  Location: [facility];  Service: Cardiovascular;  Laterality: N/A;   CARDIAC CATHETERIZATION  09/18/11   no significant CAD   CARDIAC DEFIBRILLATOR PLACEMENT  03/22/2012   DDD   CHOLECYSTECTOMY  ~ 2003   ESOPHAGOGASTRODUODENOSCOPY (EGD) WITH PROPOFOL N/A 09/07/2021   Procedure: ESOPHAGOGASTRODUODENOSCOPY (EGD) WITH PROPOFOL;  Surgeon: Otis Brace, MD;  Location: [facility];  Service: Gastroenterology;  Laterality: N/A;   IMPLANTABLE CARDIOVERTER DEFIBRILLATOR IMPLANT N/A 03/22/2012   Procedure: IMPLANTABLE CARDIOVERTER DEFIBRILLATOR IMPLANT;  Surgeon: Evans Lance, MD;  Location: [facility] C[facility] LAB;  Service: Cardiovascular;  Laterality: N/A;   KNEE ARTHROSCOPY Left 11/29/2017   Procedure: ARTHROSCOPY LEFT KNEE;  Surgeon: Dorna Leitz, MD;  Location: [facility];  Service: Orthopedics;  Laterality: Left;   KNEE ARTHROSCOPY Right 08/09/2019   Procedure: ARTHROSCOPY RIGHT KNEE, CHONDROPLASTY,  PARTIAL MEDIAL  MENSICECTOMY;  Surgeon: Dorna Leitz, MD;  Location: [facility];  Service: Orthopedics;  Laterality: Right;   KYPHOPLASTY N/A 11/27/2013   Procedure: KYPHOPLASTY;  Surgeon: Sinclair Ship, MD;  Location: [facility];  Service: Orthopedics;  Laterality: N/A;  T9, T11 kyphoplasty   KYPHOPLASTY N/A 06/05/2014   Procedure: KYPHOPLASTY;  Surgeon: Sinclair Ship, MD;  Location: [facility];  Service: Orthopedics;  Laterality: N/A;  T7 kyphoplasty   MOHS SURGERY  06/2011   "forehead" (03/22/2012)   [place]   "top of my head and my right back shoulder"   SKIN CANCER EXCISION     back   SKIN CANCER EXCISION     face   SUPRAVENTRICULAR TACHYCARDIA ABLATION  03/22/2012   unable to induce VT/RN report 03/22/2012   SUPRAVENTRICULAR TACHYCARDIA ABLATION N/A 03/22/2012   Procedure: SUPRAVENTRICULAR TACHYCARDIA ABLATION;  Surgeon: Evans Lance, MD;  Location[facility] C[facility] LAB;  Service: Cardiovascular;  Laterality: N/A;    TONSILLECTOMY AND ADENOIDECTOMY  1950   TUBAL LIGATION  1978   HPI:  Pt is 77 yo female presenting via EMS due to loss of peripheral vision and dizziness. L side effected more than R side. PMH: AICD, Anemia, anxiety, arthritis, asthma, casal cell cardinoma, bursitis, CHF, depression dysrhythmia, GERD, head injury, hiatl hernia, HTN, ICD, migraines, NICM, OSA, squamous cell cardinoma of skin, sudden arrhythmia death syndrome, WPW   Assessment / Plan / Recommendation Clinical Impression  Pt seen for speech-language-cognitive assessment. Pt said she has "senior moments" at times but denies impairments. All areas are within normal limits. She is able to express her thoughts fluently with 100% intelligibility. Cognitively she scored a 28/30 on SLUMS which is in normal range. Pt is in agreement with no further ST needs at this time.    SLP Assessment  SLP Recommendation/Assessment: Patient does not need any further Speech [place] [facility] Services SLP Visit Diagnosis: Cognitive communication deficit (R41.841)    Recommendations for follow up therapy are one component of a multi-disciplinary discharge planning process, led by the attending physician.  Recommendations may be updated based on patient status, additional functional criteria and insurance authorization.    Follow Up Recommendations  No SLP follow up    Assistance Recommended  at Discharge  None  Functional Status Assessment Patient has not had a recent decline in their functional status  Frequency and Duration           SLP Evaluation Cognition  Overall Cognitive Status: Within Functional Limits for tasks assessed Arousal/Alertness: Awake/alert Orientation Level: Oriented X4 Year: 2024 Day of Week: Correct Attention: Sustained Sustained Attention: Appears intact Memory: Impaired Memory Impairment: Retrieval deficit (4/5 word recall independently) Awareness: Appears intact Problem Solving: Appears intact Safety/Judgment:  Appears intact       Comprehension  Auditory Comprehension Overall Auditory Comprehension: Appears within functional limits for tasks assessed Visual Recognition/Discrimination Discrimination: Not tested Reading Comprehension Reading Status: Not tested    Expression Expression Primary Mode of Expression: Verbal Verbal Expression Overall Verbal Expression: Appears within functional limits for tasks assessed Initiation: No impairment Repetition:  (NT) Naming: No impairment Pragmatics: No impairment Written Expression Dominant Hand: Right Written Expression: Not tested   Oral / Motor  Oral Motor/Sensory Function Overall Oral Motor/Sensory Function: Within functional limits Motor Speech Overall Motor Speech: Appears within functional limits for tasks assessed Respiration: Within functional limits Phonation: Normal Resonance: Within functional limits Articulation: Within functional limitis Intelligibility: Intelligible Motor Planning: Witnin functional limits            [place] Siren 05/20/2022, 11:37 AM

## 2022-05-20 NOTE — Discharge Instructions (Addendum)
Paula Booker  You were evaluated and treated in the hospital for sudden vision changes and dizziness.  The results of your evaluation are suggestive of a stroke.  However, there are no signs of brain injury on CAT scan.  This episode may have been caused by a small blood clot that traveled to your brain.  Your underlying atrial fibrillation puts you at risk for this type of stroke. We are discharging you now that you are doing better. To help assist you on your road to recovery, I have written the following recommendations:  Continue taking your medicine as prescribed.  It is especially important to continue your apixaban (Eliquis) because this medicine reduces your risk for stroke from atrial fibrillation.  You were also treated for a surgical site infection at the site of your defibrillator.  Continue taking antibiotics for this until 05/27/2022. - Take clindamycin 300 mg every 6 hours for 6 days after discharge from the hospital  Follow-up with your primary care doctor soon as possible after you leave the hospital.  I recommend returning to the hospital for recurrent signs or symptoms of stroke including, but not limited to, vision changes, balance issues, problems with speech, sudden weakness, facial droop, numbness, or tingling.  It was a privilege to be a part of your hospital care team, and I hope you feel better as a result of your stay.  All the best, Nani Gasser, MD

## 2022-05-20 NOTE — Progress Notes (Signed)
Echocardiogram 2D Echocardiogram has been performed.  Ronny Flurry 05/20/2022, 9:18 AM

## 2022-05-20 NOTE — TOC Transition Note (Signed)
Transition of Care [facility]) - CM/SW Discharge Note   Patient Details  Name: Paula Booker MRN: [id number] Date of Birth: 1945/07/23  [facility] CM/SW Contact:  Pollie Friar, RN Phone Number: 05/20/2022, 3:53 PM   Clinical Narrative:    Pt is discharging home with self care. No f/u per PT/OT.  Pt has transportation home.    Final next level of care: Home/Self Care Barriers to Discharge: No Barriers Identified   Patient Goals and CMS Choice      Discharge Placement                         Discharge Plan and Services Additional resources added to the After Visit Summary for                                       Social Determinants of Health (SDOH) Interventions SDOH Screenings   Food Insecurity: No Food Insecurity (05/19/2022)  Housing: Low Risk  (05/19/2022)  Transportation Needs: No Transportation Needs (05/19/2022)  Utilities: Not At Risk (05/19/2022)  Tobacco Use: Low Risk  (05/19/2022)     Readmission Risk Interventions     No data to display

## 2022-05-20 NOTE — Plan of Care (Signed)
  Problem: Education: Goal: Knowledge of cardiac device and self-care will improve Outcome: Progressing Goal: Ability to safely manage health related needs after discharge will improve Outcome: Progressing Goal: Individualized Educational Video(s) Outcome: Progressing   Problem: Cardiac: Goal: Ability to achieve and maintain adequate cardiopulmonary perfusion will improve Outcome: Progressing   Problem: Education: Goal: Knowledge of disease or condition will improve Outcome: Progressing Goal: Knowledge of secondary prevention will improve (MUST DOCUMENT ALL) Outcome: Progressing Goal: Knowledge of patient specific risk factors will improve Elta [place] N/A or DELETE if not current risk factor) Outcome: Progressing   Problem: Ischemic Stroke/TIA Tissue Perfusion: Goal: Complications of ischemic stroke/TIA will be minimized Outcome: Progressing   Problem: Coping: Goal: Will verbalize positive feelings about self Outcome: Progressing Goal: Will identify appropriate support needs Outcome: Progressing   Problem: Health Behavior/Discharge Planning: Goal: Ability to manage health-related needs will improve Outcome: Progressing Goal: Goals will be collaboratively established with patient/family Outcome: Progressing   Problem: Self-Care: Goal: Ability to participate in self-care as condition permits will improve Outcome: Progressing Goal: Verbalization of feelings and concerns over difficulty with self-care will improve Outcome: Progressing Goal: Ability to communicate needs accurately will improve Outcome: Progressing   Problem: Nutrition: Goal: Risk of aspiration will decrease Outcome: Progressing Goal: Dietary intake will improve Outcome: Progressing   Problem: Education: Goal: Knowledge of General Education information will improve Description: Including pain rating scale, medication(s)/side effects and non-pharmacologic comfort measures Outcome: Progressing   Problem:  Health Behavior/Discharge Planning: Goal: Ability to manage health-related needs will improve Outcome: Progressing   Problem: Clinical Measurements: Goal: Ability to maintain clinical measurements within normal limits will improve Outcome: Progressing Goal: Will remain free from infection Outcome: Progressing Goal: Diagnostic test results will improve Outcome: Progressing Goal: Respiratory complications will improve Outcome: Progressing Goal: Cardiovascular complication will be avoided Outcome: Progressing   Problem: Activity: Goal: Risk for activity intolerance will decrease Outcome: Progressing   Problem: Nutrition: Goal: Adequate nutrition will be maintained Outcome: Progressing   Problem: Coping: Goal: Level of anxiety will decrease Outcome: Progressing   Problem: Elimination: Goal: Will not experience complications related to bowel motility Outcome: Progressing Goal: Will not experience complications related to urinary retention Outcome: Progressing   Problem: Pain Managment: Goal: General experience of comfort will improve Outcome: Progressing   Problem: Safety: Goal: Ability to remain free from injury will improve Outcome: Progressing   Problem: Skin Integrity: Goal: Risk for impaired skin integrity will decrease Outcome: Progressing

## 2022-05-20 NOTE — Progress Notes (Addendum)
STROKE TEAM PROGRESS NOTE   INTERVAL HISTORY No family at the bedside. Unable to have an MRI for 6 weeks following pacemaker generator exchange. OT is at the bedside, gait is steady, will not need follow up. She does still report some floaters in her bilateral vision but no black spots.  Very pleasant formal [employer].  She is very chatty and upbeat and it was a pleasure getting to know her.  Vitals:   05/19/22 1824 05/19/22 2000 05/20/22 0600 05/20/22 0744  BP:  (!) 112/53 (!) 112/52 126/67  Pulse:  73  76  Resp:  16  16  Temp: 98.4 F (36.9 C) 98.1 F (36.7 C)  97.7 F (36.5 C)  TempSrc: Oral Oral  Oral  SpO2:  96%  96%  Weight:  83.8 kg    Height:  5' 3"$  (1.6 m)     CBC:  Recent Labs  Lab 05/19/22 1440 05/20/22 0431  WBC 9.3 7.3  NEUTROABS 5.1  --   HGB 15.3* 14.4  HCT 44.7 41.8  MCV 93.1 92.3  PLT 207 [id number]   Basic Metabolic Panel:  Recent Labs  Lab 05/19/22 1440 05/20/22 0431  NA 139 135  K 3.7 4.1  CL 100 99  CO2 28 24  GLUCOSE 101* 158*  BUN 19 19  CREATININE 0.94 0.90  CALCIUM 9.5 9.4   Lipid Panel:  Recent Labs  Lab 05/20/22 0431  CHOL 163  TRIG 91  HDL 54  CHOLHDL 3.0  VLDL 18  LDLCALC 91   HgbA1c:  Recent Labs  Lab 05/19/22 1600  HGBA1C 5.7*   Urine Drug Screen:  Recent Labs  Lab 05/19/22 1722  LABOPIA NONE DETECTED  COCAINSCRNUR NONE DETECTED  LABBENZ NONE DETECTED  AMPHETMU NONE DETECTED  THCU NONE DETECTED  LABBARB NONE DETECTED    Alcohol Level  Recent Labs  Lab 05/19/22 2004  ETH <10    IMAGING past 24 hours CT ANGIO HEAD NECK W WO CM W PERF (CODE STROKE)  Result Date: 05/19/2022 CLINICAL DATA:  Left visual field deficit.  Left quadrantanopia. EXAM: CT ANGIOGRAPHY HEAD AND NECK CT PERFUSION BRAIN TECHNIQUE: Multidetector CT imaging of the head and neck was performed using the standard protocol during bolus administration of intravenous contrast. Multiplanar CT image reconstructions and MIPs were obtained to  evaluate the vascular anatomy. Carotid stenosis measurements (when applicable) are obtained utilizing NASCET criteria, using the distal internal carotid diameter as the denominator. Multiphase CT imaging of the brain was performed following IV bolus contrast injection. Subsequent parametric perfusion maps were calculated using RAPID software. RADIATION DOSE REDUCTION: This exam was performed according to the departmental dose-optimization program which includes automated exposure control, adjustment of the mA and/or kV according to patient size and/or use of iterative reconstruction technique. CONTRAST:  114m OMNIPAQUE IOHEXOL 350 MG/ML SOLN COMPARISON:  None Available. FINDINGS: CTA NECK FINDINGS Aortic arch: Standard 3 vessel aortic arch. Wide patency of the brachiocephalic and subclavian arteries. Right carotid system: Patent without evidence of stenosis, dissection, or significant atherosclerosis. Left carotid system: Patent without evidence of stenosis, dissection, or significant atherosclerosis. Partially retropharyngeal course of the ICA. Vertebral arteries: Patent without evidence of stenosis, dissection, or significant atherosclerosis. Mildly limited assessment of the left V1 segment due to venous contrast. Slightly dominant left vertebral artery. Skeleton: Minor cervical spondylosis. Other neck: No evidence of cervical lymphadenopathy or mass. Upper chest: Clear lung apices. Review of the MIP images confirms the above findings CTA HEAD FINDINGS Anterior circulation: The internal  carotid arteries are widely patent from skull base to carotid termini. ACAs and MCAs are patent without evidence of a proximal branch occlusion or significant proximal stenosis. No aneurysm is identified. Posterior circulation: The intracranial vertebral arteries are widely patent to the basilar. Patent AICA and SCA origins are seen bilaterally. The basilar artery is widely patent. There are small posterior communicating arteries  bilaterally. Both PCAs are patent without evidence of a significant proximal stenosis. No aneurysm is identified. Venous sinuses: Patent. Anatomic variants: None. Review of the MIP images confirms the above findings CT Brain Perfusion Findings: ASPECTS: 10 CBF (<30%) Volume: 0 mL Perfusion (Tmax>6.0s) volume: 0 mL Mismatch Volume: 0 mL Infarction Location: No infarct identified by CTP. The preliminary finding of no LVO was communicated to Dr. Rory Percy at 3:05 pm on 05/19/2022 by text page via the [facility] messaging system. IMPRESSION: 1. No large vessel occlusion or significant stenosis in the head or neck. 2. Negative CTP. Electronically Signed   By: Logan Bores M.D.   On: 05/19/2022 15:20   CT HEAD CODE STROKE WO CONTRAST  Result Date: 05/19/2022 CLINICAL DATA:  Code stroke.  Left visual field deficit. EXAM: CT HEAD WITHOUT CONTRAST TECHNIQUE: Contiguous axial images were obtained from the base of the skull through the vertex without intravenous contrast. RADIATION DOSE REDUCTION: This exam was performed according to the departmental dose-optimization program which includes automated exposure control, adjustment of the mA and/or kV according to patient size and/or use of iterative reconstruction technique. COMPARISON:  Head CT 11/25/2021 FINDINGS: Brain: There is no evidence of an acute infarct, intracranial hemorrhage, mass, midline shift, or extra-axial fluid collection. The ventricles and sulci are normal. Vascular: No hyperdense vessel. Skull: No acute fracture or suspicious osseous lesion. Asymmetric right TMJ arthropathy. Sinuses/Orbits: Visualized paranasal sinuses and mastoid air cells are clear. Bilateral cataract extraction. Other: None. ASPECTS ([place]orth Stroke Program Early CT Score) - Ganglionic level infarction (caudate, lentiform nuclei, internal capsule, insula, M1-M3 cortex): 7 - Supraganglionic infarction (M4-M6 cortex): 3 Total score (0-10 with 10 being normal): 10 These results were communicated to  Dr. Rory Percy at 3:05 pm on 05/19/2022 by text page via the [facility] messaging system. IMPRESSION: Unremarkable CT appearance of the brain.  ASPECTS of 10. Electronically Signed   By: Logan Bores M.D.   On: 05/19/2022 15:05    PHYSICAL EXAM GENERAL: Awake, alert in NAD CV - S1S2 RRR, no m/r/g, equal pulses bilaterally.   NEURO:  Mental Status: AA&Ox4 Language: speech is clear.  Naming, repetition, fluency, and comprehension intact. Cranial Nerves: PERRL EOMI, visual fields with left upper quadrant field cut, no facial asymmetry, facial sensation intact, hearing intact, tongue/uvula/soft palate midline, normal sternocleidomastoid and trapezius muscle strength. No evidence of tongue atrophy or fibrillations Motor: bilaterally uppers 5/5, left grip 4/5, right lower 5/5, left lower 5/5 with drift  Tone: is normal and bulk is normal Sensation- decreased sensation on left  Coordination: FTN intact bilaterally, no ataxia in BLE. Gait- Steady, unassisted  ASSESSMENT/PLAN Ms. AHJAH RAUDENBUSH is a 77 y.o. female with history of  PAF on Eliquis, HTN, CHF, s/p AICD, WPW, migraines, OSA not on CPAP, anxiety and depression who presents to [facility] for evaluation of peripheral vision loss and dizziness. Plan for outpatient MRI in 6 weeks. Repeat CT Head pending.   TIA vs small stroke Code Stroke CT head No acute abnormality. ASPECTS 10.    CTA head & neck No LVO or significant stenosis. Negative CTP Repeat CT Head- pending  2D Echo  EF 30-35%  LDL 91 HgbA1c 5.7 VTE prophylaxis - Lovenox    Diet   Diet Heart Room service appropriate? Yes; Fluid consistency: Thin   Eliquis (apixaban) daily prior to admission, now on Eliquis (apixaban) daily.  Therapy recommendations:  No follow up Disposition:  Home   Atrial fibrillation  Eliquis held for pacemaker generator replacement, per cardiology she is to resume it on 2/10  Hx of Wolff parkinson white s/p pacemaker generator replacement 2/5 (initially placed in  2013) Concern for surgical site infection- clindamycin  Unable to have MRI for 6 weeks post MRI placement   Hypertension Home meds:  Lasix, , Coreg Stable Permissive hypertension (OK if < 220/120) but gradually normalize in 5-7 days Long-term BP goal normotensive  Hyperlipidemia Home meds:  Crestor 34m, resumed in hospital LDL 91, goal < 70 Increased to 265mContinue statin at discharge  Other Stroke Risk Factors Advanced Age >/= 6544Obesity, Body mass index is 32.73 kg/m., BMI >/= 30 associated with increased stroke risk, recommend weight loss, diet and exercise as appropriate  Obstructive sleep apnea, on CPAP at home Congestive heart failure Entresto  [facility]  Patient seen and examined by NP/APP with MD. MD to update note as needed.   DeJanine OresDNP, FNP-BC [facility] Pager: [phone] ATTESTATION:  [age]ear old with history of atrial fibrillation, nonischemic cardiomyopathy status post pacemaker placement recently and off anticoagulation due to this procedure.   Unable to get MRI due to recent upgrade her pacemaker.  Unable to get MRI for this reason we will get repeat CT head if negative can be discharged home.  Discussed with primary team to confirm the touch base with cardiology and restarting Eliquis is okay from their standpoint for stroke prevention.  Aspirin was stopped.  She is on a statin.  Follow-up in stroke clinic outpatient  Repeat head CT neg. Neurology will sign off.   Dr. PaReeves Forthvaluated pt independently, reviewed imaging, chart, labs. Discussed and formulated plan with the Resident/APP. Changes were made to the note where appropriate. Please see APP/resident note above for details.   Plan discussed with primary team Dr. McCarin Primrose Neomia Herbel,MD    To contact Stroke Continuity provider, please refer to [email or web address], contact General Neurology

## 2022-05-20 NOTE — Progress Notes (Signed)
Pt and daughter verbalized d/c instructions in AVS. Patient received meds fron pharmacy. All questions answered and IV removed. Pt d/c with belongings.

## 2022-05-20 NOTE — Progress Notes (Signed)
Notified by IM team of some concerns of possible infection at her recent device upgrade site. She is s/p ICD upgrade to CRT-D on 05/16/22 w/Dr. Lovena Le  Patient is here 2/2 L visual changes neuro w/u  We were asked to comment on her re-start of [place], initially planned for tomorrow Given neuro concerns felt reasonable to resume [place] today if that is what was recommended from a neurological perspective Device check by industry did not note AFib  Pt reports that the site feels much better after a dose of antibiotic already Steri strips are in place There is mild erythema medial side of the site, though also note numerous erythematous area on her skin where she clearly had electrodes on, with adhesive skin sensitivity She no longer tender (was yesterday, and reports felt warm yesterday as well) I do not appreciate any fluctuation, edema, drainage, or heat. No hematoma or bleeding.   IM service has provided her Rx for antibiotic regime  She has an appointment in the office next week for a wound check.  Tommye Standard, PA-C

## 2022-05-20 NOTE — Progress Notes (Incomplete)
                 Interval history Feels well, currently undergoing echo at bedside.  Does report some black spots in the left-sided periphery of her vision.  The site of her implantable pacemaker/defibrillator is still painful.  Physical exam Blood pressure 126/67, pulse 76, temperature 97.7 F (36.5 C), temperature source Oral, resp. rate 16, height '5\' 3"'$  (1.6 m), weight 83.8 kg, SpO2 96 %.  Comfortable appearing Breathing is regular and unlabored Alert and oriented, speech is normal, face is symmetric Pleasant, mood and affect are concordant  Labs, images, and other studies Brain MRI without contrast pending  Assessment and plan Hospital day 0  Paula Booker is a 77 y.o. with history of atrial fibrillation and nonischemic cardiomyopathy admitted for acute visual deficits and evaluation for cerebral infarction.  Principal Problem:   Cerebral infarction [facility]) Active Problems:   NICM (nonischemic cardiomyopathy) ([place])   Essential hypertension   Pacemaker   OSA (obstructive sleep apnea)   Paroxysmal atrial fibrillation (HCC)   Infection of pacemaker pocket (HCC)  Left visual field deficit Cerebral infarction The most likely etiology is for a thromboembolic event in the setting of recent interruption to anticoagulation.  A recent coronary artery calcium score was 0, and her only risk factor for ASCVD events is her age.  Echo shows no large LV thrombus.  Will repeat CT head without contrast to evaluate for evolution of cerebral infarction.  No MRI because of recent upgrade of her implantable pacemaker/defibrillator.  Will also have the device manufacturer perform interrogation for arrhythmia, to be uploaded to EHR after complete.  Will restart apixaban.  Discontinuing aspirin.  Can continue rosuvastatin at home dose. - Follow-up MRI - Stop aspirin - Start apixaban 5 mg twice daily - Rosuvastatin 5 mg daily - PT/OT  Surgical site infection No systemic infection or sepsis.  The  site is still warm, red, and tender.  Continue antibiotics until 05/27/2022. - Clindamycin 300 mg 4 times daily  Nonischemic cardiomyopathy Euvolemic.  Echo stable from last.  Overall very well compensated and on good therapy.  Will restart home medications after imaging to rule out large infarct.  Diet: Regular IVF: None VTE: Therapeutic apixaban Code: Full PT/OT recommendations: Pending  Discharge plan: Anticipate discharge home after evaluation for cerebral infarction   Nani Gasser MD 05/20/2022, 11:14 AM  Pager: [phone] After 5pm on weekdays and 1pm on weekends: [phone]

## 2022-05-20 NOTE — Discharge Summary (Cosign Needed)
Name: [name] MRN: [id number] DOB: 25-Jul-1945 77 y.o. PCP: [name], MD  Date of Admission: 05/19/2022  1:23 PM Date of Discharge: 05/20/2022 3:52 PM Attending Physician: Axel Filler, *  Discharge Diagnosis: Principal Problem:   Cerebral infarction [facility]) Active Problems:   NICM (nonischemic cardiomyopathy) ([place])   Essential hypertension   Pacemaker   OSA (obstructive sleep apnea)   Paroxysmal atrial fibrillation ([place])   Infection of pacemaker pocket [facility])   Discharge Medications: Allergies as of 05/20/2022       Reactions   Ivp Dye [iodinated Contrast Media] Hives, Other (See Comments)   pt tolerates with benadryl   Penicillins Anaphylaxis   Has patient had a PCN reaction causing immediate rash, facial/tongue/throat swelling, SOB or lightheadedness with hypotension: Yes Has patient had a PCN reaction causing severe rash involving mucus membranes or skin necrosis: No Has patient had a PCN reaction that required hospitalization No Has patient had a PCN reaction occurring within the last 10 years: No If all of the above answers are "NO", then may proceed with Cephalosporin use.   Sulfonamide Derivatives Other (See Comments)   Migraines   Tessalon [benzonatate] Hives, Rash   Severe rash   Sulfa Antibiotics Other (See Comments)   Migraines   Rosuvastatin Calcium Itching   Onion Other (See Comments)   Raw onions cause migraines, patient can eat cooked onions         Medication List     TAKE these medications    acetaminophen 500 MG tablet Commonly known as: TYLENOL Take 500 mg by mouth in the morning and at bedtime.   albuterol 108 (90 Base) MCG/ACT inhaler Commonly known as: VENTOLIN HFA Inhale 2 puffs into the lungs every 6 (six) hours as needed for wheezing. For wheezing. Use 2 puffs 3 times daily x5 days and then every 6 hours as needed. What changed: additional instructions   Breo Ellipta 200-25 MCG/ACT Aepb Generic drug: fluticasone  furoate-vilanterol Inhale 1 puff by mouth once daily   Calcium Carb-Cholecalciferol 600-20 MG-MCG Tabs Take 1 tablet by mouth in the morning.   carvedilol 6.25 MG tablet Commonly known as: COREG Take 1 tablet (6.25 mg total) by mouth 2 (two) times daily.   cetirizine 10 MG tablet Commonly known as: ZYRTEC Take 10 mg by mouth in the morning.   clindamycin 300 MG capsule Commonly known as: CLEOCIN Take 1 capsule (300 mg total) by mouth every 6 (six) hours.   COLLAGEN PLUS VITAMIN C PO Take 1 tablet by mouth in the morning.   Eliquis 5 MG Tabs tablet Generic drug: apixaban Take 1 tablet by mouth twice daily   Entresto 24-26 MG Generic drug: sacubitril-valsartan Take 1 tablet by mouth twice daily   escitalopram 20 MG tablet Commonly known as: LEXAPRO Take 20 mg by mouth in the morning.   furosemide 40 MG tablet Commonly known as: LASIX TAKE 1 TO 2 TABLETS BY MOUTH AS DIRECTED ONCE DAILY FOR  EDEMA,  SHORTNESS  OF  BREATH  OR  WEIGHT  GAIN What changed: See the new instructions.   ipratropium-albuterol 0.5-2.5 (3) MG/3ML Soln Commonly known as: DUONEB Take 3 mLs by nebulization every 4 (four) hours as needed. What changed: reasons to take this   Jardiance 10 MG Tabs tablet Generic drug: empagliflozin TAKE 1 TABLET BY MOUTH ONCE DAILY BEFORE BREAKFAST   multivitamin with minerals Tabs tablet Take 1 tablet by mouth in the morning.   pantoprazole 40 MG tablet Commonly known as:  PROTONIX Take 40 mg by mouth daily before breakfast.   rosuvastatin 5 MG tablet Commonly known as: CRESTOR Take 5 mg by mouth at bedtime.   spironolactone 25 MG tablet Commonly known as: ALDACTONE Take 1 tablet (25 mg total) by mouth daily.   Vitamin B-12 5000 MCG Lozg Take 5,000 mcg by mouth in the morning.   Vitamin D3 125 MCG (5000 UT) Caps Take 5,000 Units by mouth in the morning.        Follow-up Appointments:  Follow-up Information     [name], MD. Call.    Specialty: Family Medicine Why: Call for follow-up appointment as soon as possible after leaving the hospital. Contact information: [place] [address] [phone]                 Disposition and follow-up: Paula Booker is a 77 y.o. year old hospitalized for workup of suspected cerebral infarction.  Cerebral infarction versus transient ischemic attack Presented with sudden onset left-sided visual field deficits.  Imaging negative.  Resolved while hospitalized.  Suspect small cardioembolic event while anticoagulation was held for surgery. - Continue apixaban 5 mg twice daily - Follow-up MRI brain around 07/01/2022  Infection of pacemaker pocket Concerned about redness and tenderness over pocket site.  Because of penicillin and sulfa drug allergy, started a 7-day course of clindamycin. - Clindamycin 300 mg 4 times daily through 05/26/2022  Hospital Course by problem list: Cerebral infarction versus transient ischemic attack Presenting concerns were acute left visual field deficit and sensation described as dizziness.  Her initial neurologic exam was notable for left upper quadrant visual field cut.  Initial head CT was negative.  By the following morning her deficits had resolved and she was no longer dizzy.  She could not get an MRI because of recent upgrade to her implantable pacemaker/defibrillator.  Repeat CT head after 24 hours was unremarkable.  This event occurred in the setting of a pause in anticoagulation for placement of upgraded pacemaker/defibrillator.  She has no risk factors for ASCVD except for age.  Recent coronary calcium score was 0.  Low index of suspicion for atheroembolic or small vessel disease.  She was discharged with instructions to continue her apixaban.  Pacemaker pocket site infection She had the procedure on 05/16/2022.  The site felt fine afterwards, until a couple of days later when it became red, warm, and tender.  No drainage or  purulence noted from the site.  No fevers or signs of systemic infection.  Because of her allergies to penicillins and sulfa drugs, she was started on a 7-day course of clindamycin.  By the day of discharge the pain and redness had improved.  She was instructed to follow-up with her cardiac electrophysiologist.  Discharge Exam: In good spirits.  No dizziness.  No visual field deficits.  Some black floaters on the left side of her vision that resolved by the afternoon.  Eager to go home.   Blood pressure 135/61, pulse 79, temperature 98 F (36.7 C), temperature source Oral, resp. rate 16, height 5' 3"$  (1.6 m), weight 83.8 kg, SpO2 94 %.  Comfortable appearing Breathing is regular and unlabored Skin is warm and dry Alert and oriented, speech is normal, face is symmetric, equal grip strength, no dysdiadochokinesia bilateral upper extremities, bilateral peripheral vision intact Pleasant, mood and affect are concordant  Pertinent studies and procedures:  CT head without contrast IMPRESSION (05/19/22 14:57): Unremarkable CT appearance of the brain.  ASPECTS  of 10.  CT angio head neck IMPRESSION: 1. No large vessel occlusion or significant stenosis in the head or neck. 2. Negative CTP.  TTE IMPRESSION:  1. Left ventricular ejection fraction, by estimation, is 30 to 35%. The  left ventricle has moderately decreased function. The left ventricle  demonstrates global hypokinesis. Left ventricular diastolic parameters are  consistent with Grade I diastolic  dysfunction (impaired relaxation).   2. Right ventricular systolic function is normal. The right ventricular  size is normal. Tricuspid regurgitation signal is inadequate for assessing  PA pressure.   3. The mitral valve is normal in structure. Trivial mitral valve  regurgitation. No evidence of mitral stenosis.   4. The aortic valve was not well visualized. Aortic valve regurgitation  is not visualized. No aortic stenosis is present.   5.  The inferior vena cava is normal in size with greater than 50%  respiratory variability, suggesting right atrial pressure of 3 mmHg.   CT head without contrast IMPRESSION (05/20/22 15:24): No acute intracranial abnormality.   Latest Reference Range & Units 05/20/22 04:31  Total CHOL/HDL Ratio RATIO 3.0  Cholesterol 0 - 200 mg/dL 163  HDL Cholesterol >40 mg/dL 54  LDL (calc) 0 - 99 mg/dL 91  Triglycerides <150 mg/dL 91  VLDL 0 - 40 mg/dL 18     Latest Reference Range & Units 05/20/22 04:31  Sodium 135 - 145 mmol/L 135  Potassium 3.5 - 5.1 mmol/L 4.1  Chloride 98 - 111 mmol/L 99  CO2 22 - 32 mmol/L 24  Glucose 70 - 99 mg/dL 158 (H)  BUN 8 - 23 mg/dL 19  Creatinine 0.44 - 1.00 mg/dL 0.90  Calcium 8.9 - 10.3 mg/dL 9.4  Anion gap 5 - 15  12  GFR, Estimated >60 mL/min >60  (H): Data is abnormally high   Latest Reference Range & Units 05/20/22 04:31  WBC 4.0 - 10.5 K/uL 7.3  RBC 3.87 - 5.11 MIL/uL 4.53  Hemoglobin 12.0 - 15.0 g/dL 14.4  HCT 36.0 - 46.0 % 41.8  MCV 80.0 - 100.0 fL 92.3  MCH 26.0 - 34.0 pg 31.8  MCHC 30.0 - 36.0 g/dL 34.4  RDW 11.5 - 15.5 % 12.2  Platelets 150 - 400 K/uL 197  nRBC 0.0 - 0.2 % 0.0    Discharge Instructions:   Discharge Instructions      Ms. JOSELYNE KOLIS  You were evaluated and treated in the hospital for sudden vision changes and dizziness.  The results of your evaluation are suggestive of a stroke.  However, there are no signs of brain injury on CAT scan.  This episode may have been caused by a small blood clot that traveled to your brain.  Your underlying atrial fibrillation puts you at risk for this type of stroke. We are discharging you now that you are doing better. To help assist you on your road to recovery, I have written the following recommendations:  Continue taking your medicine as prescribed.  It is especially important to continue your apixaban (Eliquis) because this medicine reduces your risk for stroke from atrial  fibrillation.  You were also treated for a surgical site infection at the site of your defibrillator.  Continue taking antibiotics for this until 05/27/2022. - Take clindamycin 300 mg every 6 hours for 6 days after discharge from the hospital  Follow-up with your primary care doctor soon as possible after you leave the hospital.  I recommend returning to the hospital for recurrent signs or symptoms of  stroke including, but not limited to, vision changes, balance issues, problems with speech, sudden weakness, facial droop, numbness, or tingling.  It was a privilege to be a part of your hospital care team, and I hope you feel better as a result of your stay.  All the best, Nani Gasser, MD     Nani Gasser MD 05/20/2022, 3:52 PM

## 2022-05-25 ENCOUNTER — Ambulatory Visit: Payer: Medicare HMO | Attending: Internal Medicine

## 2022-05-25 DIAGNOSIS — I428 Other cardiomyopathies: Secondary | ICD-10-CM | POA: Diagnosis not present

## 2022-05-25 NOTE — Patient Instructions (Addendum)

## 2022-05-26 ENCOUNTER — Ambulatory Visit: Payer: Medicare HMO

## 2022-05-26 LAB — CUP PACEART INCLINIC DEVICE CHECK
Battery Remaining Longevity: 120 mo
Brady Statistic RA Percent Paced: 1.2 %
Date Time Interrogation Session: 20240214212534
HighPow Impedance: 68 Ohm
Implantable Lead Connection Status: 753985
Implantable Lead Connection Status: 753985
Implantable Lead Connection Status: 753985
Implantable Lead Implant Date: 20131212
Implantable Lead Implant Date: 20131212
Implantable Lead Implant Date: 20240205
Implantable Lead Location: 753858
Implantable Lead Location: 753859
Implantable Lead Location: 753860
Implantable Lead Model: 4598
Implantable Lead Model: 5076
Implantable Lead Model: 6935
Implantable Pulse Generator Implant Date: 20240205
Lead Channel Impedance Value: 475 Ohm
Lead Channel Impedance Value: 684 Ohm
Lead Channel Impedance Value: 770 Ohm
Lead Channel Pacing Threshold Amplitude: 0.5 V
Lead Channel Pacing Threshold Amplitude: 0.75 V
Lead Channel Pacing Threshold Amplitude: 1.5 V
Lead Channel Pacing Threshold Pulse Width: 0.4 ms
Lead Channel Pacing Threshold Pulse Width: 0.4 ms
Lead Channel Pacing Threshold Pulse Width: 0.4 ms
Lead Channel Sensing Intrinsic Amplitude: 10.9 mV
Lead Channel Sensing Intrinsic Amplitude: 4.1 mV

## 2022-05-26 NOTE — Progress Notes (Signed)
Wound check appointment. Steri-strips removed. Wound without redness or edema. Incision edges approximated, wound well healed. Some tenderness at wound site, but healing well.  Patient finishes abx therapy today for pocket site infection given by ER last week.  Incision/pocket area shows no signs of infection today and wound healing WNL at this point.    Normal device function.  BIV pacing effectively 95.4% Thresholds, sensing, and impedances consistent with implant measurements. Device programmed at 3.5V for extra safety margin until 3 month visit. Histogram distribution appropriate for patient and level of activity. No mode switches or ventricular arrhythmias noted. Patient educated about wound care, arm mobility, lifting restrictions, shock plan. ROV in 3 months with implanting physician.

## 2022-05-31 DIAGNOSIS — D6869 Other thrombophilia: Secondary | ICD-10-CM | POA: Diagnosis not present

## 2022-05-31 DIAGNOSIS — I480 Paroxysmal atrial fibrillation: Secondary | ICD-10-CM | POA: Diagnosis not present

## 2022-05-31 DIAGNOSIS — I700 Atherosclerosis of aorta: Secondary | ICD-10-CM | POA: Diagnosis not present

## 2022-05-31 DIAGNOSIS — I428 Other cardiomyopathies: Secondary | ICD-10-CM | POA: Diagnosis not present

## 2022-05-31 DIAGNOSIS — G4733 Obstructive sleep apnea (adult) (pediatric): Secondary | ICD-10-CM | POA: Diagnosis not present

## 2022-05-31 DIAGNOSIS — E782 Mixed hyperlipidemia: Secondary | ICD-10-CM | POA: Diagnosis not present

## 2022-05-31 DIAGNOSIS — R5383 Other fatigue: Secondary | ICD-10-CM | POA: Diagnosis not present

## 2022-05-31 DIAGNOSIS — K7460 Unspecified cirrhosis of liver: Secondary | ICD-10-CM | POA: Diagnosis not present

## 2022-05-31 DIAGNOSIS — I509 Heart failure, unspecified: Secondary | ICD-10-CM | POA: Diagnosis not present

## 2022-06-03 ENCOUNTER — Encounter: Payer: Self-pay | Admitting: Pulmonary Disease

## 2022-06-03 ENCOUNTER — Ambulatory Visit (INDEPENDENT_AMBULATORY_CARE_PROVIDER_SITE_OTHER): Payer: Medicare HMO | Admitting: Pulmonary Disease

## 2022-06-03 VITALS — BP 126/62 | HR 74 | Temp 98.1°F | Ht 64.0 in | Wt 190.0 lb

## 2022-06-03 DIAGNOSIS — R911 Solitary pulmonary nodule: Secondary | ICD-10-CM

## 2022-06-03 DIAGNOSIS — J4530 Mild persistent asthma, uncomplicated: Secondary | ICD-10-CM | POA: Diagnosis not present

## 2022-06-03 MED ORDER — IPRATROPIUM-ALBUTEROL 0.5-2.5 (3) MG/3ML IN SOLN: 3.0000 mL | RESPIRATORY_TRACT | 11 refills | Status: AC | PRN

## 2022-06-03 MED ORDER — ALBUTEROL SULFATE HFA 108 (90 BASE) MCG/ACT IN AERS: 2.0000 | INHALATION_SPRAY | Freq: Four times a day (QID) | RESPIRATORY_TRACT | 11 refills | Status: DC | PRN

## 2022-06-03 MED ORDER — FLUTICASONE FUROATE-VILANTEROL 200-25 MCG/ACT IN AEPB: 1.0000 | INHALATION_SPRAY | Freq: Every day | RESPIRATORY_TRACT | 5 refills | Status: DC

## 2022-06-03 NOTE — Progress Notes (Signed)
Paula Booker    QL:[id number]    04-07-1946  Primary Care Physician:[name], MD  Referring Physician: [name], [place],  [place] [address]  Chief complaint:  Follow up for  Mild persistent asthma Lung nodule  HPI: 77 y.o. with history of nonischemic cardiomyopathy, syncope, WPW syndrome status post ICD implant, asthma.  She is complains of dyspnea since 2001 but reports worsening symptoms over the past 3 months. She has been evaluated in the pulmonary office in 2014 with normal PFTs. She was given [facility] inhaler for her asthma but does not appear to have improved her symptoms.  Her chief complaint is dyspnea with activity and at rest, cough with no sputum production, occasional wheezing, denies any fevers, chills, hemoptysis. As noted by Dr. Sheryn Bison, PCP she has been active at home remodeling her kitchen cabinets without any issue. She recently got a Fitbit fitness tracker and is currently doing 2500 steps per day. She has plans to increase it to 10,000 steps per day. She has also lost 6 pounds recently and plans on losing 6 more pounds. Current meds include albuterol inhaler which she uses rarely.  Hospitalized in June 2019 for dyspnea, asthma exacerbation.   Pets: 3 cats. No dogs, birds, exotic animals Occupation: Retired. She used to work as a Engineer, manufacturing systems, Pharmacist, hospital, Optometrist Exposures: No mold issues, no exposure to asbestos. No hot tubs, Jacuzzi at home Smoking history: Never smoker  Interval history: Lost to follow-up since 2019 as she stayed at home during the COVID-19 pandemic. Seen in clinic after gap of 200 fevers She reported as low back because she had a recent CT abdomen done for workup of cirrhosis.  It notes 2 mm right lower lobe lung nodule.  Breathing is stable without any issue  Outpatient Encounter Medications as of 06/03/2022  Medication Sig   acetaminophen (TYLENOL) 500 MG tablet Take 500 mg by mouth in the  morning and at bedtime.   apixaban (ELIQUIS) 5 MG TABS tablet Take 1 tablet by mouth twice daily   Calcium Carb-Cholecalciferol 600-20 MG-MCG TABS Take 1 tablet by mouth in the morning.   carvedilol (COREG) 6.25 MG tablet Take 1 tablet (6.25 mg total) by mouth 2 (two) times daily.   cetirizine (ZYRTEC) 10 MG tablet Take 10 mg by mouth in the morning.   Cholecalciferol (VITAMIN D3) 5000 units CAPS Take 5,000 Units by mouth in the morning.   Collagen-Vitamin C (COLLAGEN PLUS VITAMIN C PO) Take 1 tablet by mouth in the morning.   Cyanocobalamin (VITAMIN B-12) 5000 MCG LOZG Take 5,000 mcg by mouth in the morning.   ENTRESTO 24-26 MG Take 1 tablet by mouth twice daily   escitalopram (LEXAPRO) 20 MG tablet Take 20 mg by mouth in the morning.   furosemide (LASIX) 40 MG tablet TAKE 1 TO 2 TABLETS BY MOUTH AS DIRECTED ONCE DAILY FOR  EDEMA,  SHORTNESS  OF  BREATH  OR  WEIGHT  GAIN (Patient taking differently: Take 40 mg by mouth 2 (two) times daily.)   ipratropium-albuterol (DUONEB) 0.5-2.5 (3) MG/3ML SOLN Take 3 mLs by nebulization every 4 (four) hours as needed. (Patient taking differently: Take 3 mLs by nebulization every 4 (four) hours as needed (wheezing/SOB).)   JARDIANCE 10 MG TABS tablet TAKE 1 TABLET BY MOUTH ONCE DAILY BEFORE BREAKFAST   Multiple Vitamin (MULTIVITAMIN WITH MINERALS) TABS tablet Take 1 tablet by mouth in the morning.   pantoprazole (  PROTONIX) 40 MG tablet Take 40 mg by mouth daily before breakfast.   rosuvastatin (CRESTOR) 5 MG tablet Take 5 mg by mouth at bedtime.   spironolactone (ALDACTONE) 25 MG tablet Take 1 tablet (25 mg total) by mouth daily.   albuterol (VENTOLIN HFA) 108 (90 Base) MCG/ACT inhaler Inhale 2 puffs into the lungs every 6 (six) hours as needed for wheezing. For wheezing. Use 2 puffs 3 times daily x5 days and then every 6 hours as needed. (Patient not taking: Reported on 06/03/2022)   fluticasone furoate-vilanterol (BREO ELLIPTA) 200-25 MCG/INH AEPB Inhale 1  puff by mouth once daily (Patient not taking: Reported on 05/20/2022)   [DISCONTINUED] clindamycin (CLEOCIN) 300 MG capsule Take 1 capsule (300 mg total) by mouth every 6 (six) hours.   No facility-administered encounter medications on file as of 06/03/2022.   Physical Exam: Blood pressure 126/62, pulse 74, temperature 98.1 F (36.7 C), temperature source Oral, height '5\' 4"'$  (1.626 m), weight 190 lb (86.2 kg), SpO2 96 %. Gen:      No acute distress HEENT:  EOMI, sclera anicteric Neck:     No masses; no thyromegaly Lungs:    Clear to auscultation bilaterally; normal respiratory effort CV:         Regular rate and rhythm; no murmurs Abd:      + bowel sounds; soft, non-tender; no palpable masses, no distension Ext:    No edema; adequate peripheral perfusion Skin:      Warm and dry; no rash Neuro: alert and oriented x 3 Psych: normal mood and affect   Data Reviewed: Imaging CT chest 05/22/14-mild bilateral groundglass opacities consistent with edema CT abdomen, pelvis 03/18/15-left basilar consolidation Chest x-ray 07/27/2017- no acute cardiopulmonary abnormality. Chest x-ray 09/23/2017- no acute cardiopulmonary abnormality.  CT abdomen [facility] 04/27/2022 2 mm right lower lobe nodule (series 4, image 2).  I reviewed the images personally in PACS  PFTs FENO 09/20/16- 19 FENO 11/13/2017-15  Spirometry 05/10/12 FVC 2.15 [75%), FEV1 1.64 [72%), F/F 76  PFTs 10/17/12 FVC 2.24 [80%), FEV1 1.75 [70%), F/F 78, TLC 88%, DLCO 93% Normal study  PFTs 01/26/17 FVC 2.21 [77%], FEV1 1.65 [77%], F/F 75, TLC 86%, DLCO 86% Small airways disease with significant bronchodilator response  Peak inspiratory flow 09/11/2017-60  Labs Blood allergy profile 01/26/2017-IgE 13, RAST panel shows sensitivity to dust mite CBC 01/26/2017-WBC 8.5, eos 4.7%, absolute eosinophil count 400  Assessment:  Lung nodule 2 mm right lower lobe lung nodule appears benign and she has no risk factors with no smoking history She is  anxious about this as her sister recently got diagnosed with small cell lung cancer. Will order CT chest without contrast for better evaluation of the lung  Mild persistent asthma Even though FENO is low she has elevated peripheral eos and the PFTs show significant reduction in mid flow rates with bronchodilator response.  Not on any controller medication at present with stable symptoms  Suspected dyspnea is contributed by obesity and deconditioning.  Encouraged weight loss and exercise. Declined to do pulmonary rehab due to costs involved.   GERD On Prilosec.  Follows with [place] maintenance 03/19/15-Pneumovax Up-to-date with flu and COVID-19 vaccine  Plan/Recommendations:  - CT chest without contrast - Continue weight loss and exercise.  Marshell Garfinkel MD [facility] 06/03/2022, 1:08 PM  CC: [name], MD

## 2022-06-03 NOTE — Patient Instructions (Signed)
I had reviewed his CT from [facility] which shows a tiny lung nodule which looks benign.  Will get a dedicated CT chest without contrast for lung nodule for better evaluation of the lung Return to clinic in 1 year.

## 2022-06-15 ENCOUNTER — Other Ambulatory Visit: Payer: Self-pay | Admitting: Internal Medicine

## 2022-06-16 ENCOUNTER — Ambulatory Visit: Payer: Medicare HMO

## 2022-06-21 ENCOUNTER — Other Ambulatory Visit: Payer: Self-pay | Admitting: Internal Medicine

## 2022-06-23 ENCOUNTER — Other Ambulatory Visit (HOSPITAL_COMMUNITY): Payer: Self-pay

## 2022-06-24 DIAGNOSIS — R918 Other nonspecific abnormal finding of lung field: Secondary | ICD-10-CM | POA: Diagnosis not present

## 2022-06-26 ENCOUNTER — Other Ambulatory Visit: Payer: Self-pay | Admitting: Internal Medicine

## 2022-06-27 ENCOUNTER — Ambulatory Visit: Payer: Medicare HMO | Attending: Internal Medicine

## 2022-06-27 DIAGNOSIS — I5022 Chronic systolic (congestive) heart failure: Secondary | ICD-10-CM | POA: Diagnosis not present

## 2022-06-27 DIAGNOSIS — Z95810 Presence of automatic (implantable) cardiac defibrillator: Secondary | ICD-10-CM

## 2022-06-28 ENCOUNTER — Encounter: Payer: Self-pay | Admitting: Internal Medicine

## 2022-06-28 DIAGNOSIS — I5022 Chronic systolic (congestive) heart failure: Secondary | ICD-10-CM | POA: Diagnosis not present

## 2022-06-28 DIAGNOSIS — F331 Major depressive disorder, recurrent, moderate: Secondary | ICD-10-CM | POA: Diagnosis not present

## 2022-06-28 DIAGNOSIS — I110 Hypertensive heart disease with heart failure: Secondary | ICD-10-CM | POA: Diagnosis not present

## 2022-06-28 DIAGNOSIS — I480 Paroxysmal atrial fibrillation: Secondary | ICD-10-CM | POA: Diagnosis not present

## 2022-06-28 DIAGNOSIS — R5383 Other fatigue: Secondary | ICD-10-CM | POA: Diagnosis not present

## 2022-06-28 DIAGNOSIS — I428 Other cardiomyopathies: Secondary | ICD-10-CM | POA: Diagnosis not present

## 2022-06-28 DIAGNOSIS — I10 Essential (primary) hypertension: Secondary | ICD-10-CM | POA: Diagnosis not present

## 2022-06-28 NOTE — Progress Notes (Signed)
Remote ICD transmission.   

## 2022-06-29 NOTE — Progress Notes (Signed)
EPIC Encounter for ICM Monitoring  Patient Name: Paula Booker is a 77 y.o. female Date: 06/29/2022 Primary Care Physican: [name], MD Primary Cardiologist: Irish Lack Electrophysiologist: Lovena Le BiV Pacing: 96.4% 02/08/2022 Weight: 183 lbs 04/21/2022 Weight: 180 lbs 06/29/2022 Weight: 181 lbs   Time in AT/AF  0.0 hr/day (0.0%)         Spoke with patient and heart failure questions reviewed.  Transmission results reviewed.  Pt reports she has been extremely tired and taking long naps for 2-3 hours but even after napping she is very tired.     Optivol thoracic impedance suggesting normal fluid levels.   Prescribed:  Furosemide 40 mg take 1-2 tablets (40 mg - 80 mg total) by mouth as directed once daily for edema, SOB or weight gain.   Pt taking differently per 06/29/22: She is taking 1 tablet daily with alternating 2 tablets every other day. Spironolactone 25 mg take 1 tablet by mouth daily   Labs: 11/25/2021 Creatinine 1.02, BUN 42, Potassium 4.2, Sodium 138, GFR 57 06/16/2021 Creatinine 0.90, BUN 16, Potassium 4.2, Sodium 138, GFR >60 05/13/2021 Creatinine 0.95, BUN 9,   Potassium 4.3, Sodium 140, GFR 62 A complete set of results can be found in Results Review.   Recommendations:   No changes and encouraged to call if experiencing any fluid symptoms.   Follow-up plan: ICM clinic phone appointment on 08/01/2022.   91 day device clinic remote transmission 08/17/2022.     EP/Cardiology Office Visits:   08/18/2022 with Dr Lovena Le.    Recall 08/14/2022 with Dr Irish Lack.     Copy of ICM check sent to Dr. Lovena Le.    3 month ICM trend: 06/26/2022.    12-14 Month ICM trend:     [name], RN 06/29/2022 1:18 PM

## 2022-07-05 ENCOUNTER — Encounter: Payer: Self-pay | Admitting: Internal Medicine

## 2022-07-05 ENCOUNTER — Inpatient Hospital Stay: Admission: RE | Admit: 2022-07-05 | Payer: Medicare HMO | Source: Ambulatory Visit

## 2022-07-08 ENCOUNTER — Ambulatory Visit
Admission: RE | Admit: 2022-07-08 | Discharge: 2022-07-08 | Disposition: A | Discharge status: Home / Self Care | Payer: Medicare HMO | Source: Ambulatory Visit | Attending: Pulmonary Disease | Admitting: Pulmonary Disease

## 2022-07-08 DIAGNOSIS — R911 Solitary pulmonary nodule: Secondary | ICD-10-CM

## 2022-07-08 DIAGNOSIS — R918 Other nonspecific abnormal finding of lung field: Secondary | ICD-10-CM | POA: Diagnosis not present

## 2022-07-12 ENCOUNTER — Encounter: Payer: Self-pay | Admitting: Internal Medicine

## 2022-07-19 DIAGNOSIS — I480 Paroxysmal atrial fibrillation: Secondary | ICD-10-CM | POA: Diagnosis not present

## 2022-07-19 DIAGNOSIS — I5022 Chronic systolic (congestive) heart failure: Secondary | ICD-10-CM | POA: Diagnosis not present

## 2022-07-19 DIAGNOSIS — G4733 Obstructive sleep apnea (adult) (pediatric): Secondary | ICD-10-CM | POA: Diagnosis not present

## 2022-07-19 DIAGNOSIS — F5101 Primary insomnia: Secondary | ICD-10-CM | POA: Diagnosis not present

## 2022-07-22 NOTE — Progress Notes (Signed)
Cardiology Office Note:    Date:  08/01/2022   ID:  HAUNANI DICKARD, DOB Feb 28, 1946, MRN [id number]  PCP:  Henrine Screws, MD  [facility] Providers Cardiologist:  Lance Muss, MD Electrophysiologist:  Lewayne Bunting, MD     Referring MD: Henrine Screws, MD   Chief Complaint:  Follow-up     History of Present Illness:   Paula Booker is a 77 y.o. female with h/o ICM, chronic systolic heart failure, syncope, and a WPW pattern on her ECG. She underwent EP study which demonstrated no inducible arrhythmias. She underwent ICD insertion. She has a h/o generalized weakness.  She has had trouble with insomnia. Coronary CT calcium score 0 and no CAD.     Patient had CVA/TIA 05/2022 when off anticoag for surgery. Also concern for infected pacer pocket and treated with antibiotic. Echo LVEF 30-35%,grade 1DD  Jardiance recently stopped due to dizziness. Her sugar kept dropping. Has chronic DOE that's unchanged. Fatigued and tired all the time. No residual problems from TIA/CVA although she had 5 days of crying and feeling of emptiness. She ate 1/2 watermelon and had to take an extra lasix but overall fluid stable.  Just joined the [employer] and hoping to start swimming.      Past Medical History:  Diagnosis Date   AICD (automatic cardioverter/defibrillator) present    Anemia    as a child   Anxiety    Arthritis    "hands and knees" (03/22/2012)   Asthma    Basal cell carcinoma 04/27/2011   mid forehead([facility])   Bursitis    CHF (congestive heart failure) 09/2011   pt. denies   Depression    Dysrhythmia    WPW   GERD (gastroesophageal reflux disease)    Head injury, acute, with loss of consciousness    History of hiatal hernia    Hypertension    ICD (implantable cardiac defibrillator) in place 03/2012   Migraines    migraines    Nausea vomiting and diarrhea 03/18/2015   NICM (nonischemic cardiomyopathy)    Obesity (BMI 30-39.9)    negative sleep study 2014   OSA  (obstructive sleep apnea) 05/27/2018   Mild with AHI 6/hr    no cpap   Pneumonia    hx   Shortness of breath    "@ any time I can get SOB" (03/22/2012)   Skin cancer 1999   Squamous cell carcinoma of skin 07/08/2020   in situ right malar cheek-[id number]   Sudden arrhythmia death syndrome    WPW (Wolff-Parkinson-White syndrome)    Current Medications: Current Meds  Medication Sig   acetaminophen (TYLENOL) 500 MG tablet Take 500 mg by mouth in the morning and at bedtime.   albuterol (VENTOLIN HFA) 108 (90 Base) MCG/ACT inhaler Inhale 2 puffs into the lungs every 6 (six) hours as needed for wheezing.   apixaban (ELIQUIS) 5 MG TABS tablet Take 1 tablet by mouth twice daily   Calcium Carb-Cholecalciferol 600-20 MG-MCG TABS Take 1 tablet by mouth in the morning.   carvedilol (COREG) 6.25 MG tablet Take 1 tablet by mouth twice daily   cetirizine (ZYRTEC) 10 MG tablet Take 10 mg by mouth in the morning.   Cholecalciferol (VITAMIN D3) 5000 units CAPS Take 5,000 Units by mouth in the morning.   Collagen-Vitamin C (COLLAGEN PLUS VITAMIN C PO) Take 1 tablet by mouth in the morning.   Cyanocobalamin (VITAMIN B-12) 5000 MCG LOZG Take 5,000 mcg by mouth in the morning.  escitalopram (LEXAPRO) 20 MG tablet Take 20 mg by mouth in the morning.   fluticasone furoate-vilanterol (BREO ELLIPTA) 200-25 MCG/ACT AEPB Inhale 1 puff into the lungs daily.   furosemide (LASIX) 40 MG tablet TAKE 1 TO 2 TABLETS BY MOUTH AS DIRECTED ONCE DAILY FOR  EDEMA,  SHORTNESS  OF  BREATH  OR  WEIGHT  GAIN (Patient taking differently: Take 40 mg by mouth 2 (two) times daily.)   ipratropium-albuterol (DUONEB) 0.5-2.5 (3) MG/3ML SOLN Take 3 mLs by nebulization every 4 (four) hours as needed.   Multiple Vitamin (MULTIVITAMIN WITH MINERALS) TABS tablet Take 1 tablet by mouth in the morning.   pantoprazole (PROTONIX) 40 MG tablet Take 40 mg by mouth daily before breakfast.   rosuvastatin (CRESTOR) 5 MG tablet Take 5 mg by mouth at  bedtime.   sacubitril-valsartan (ENTRESTO) 24-26 MG Take 1 tablet by mouth twice daily   spironolactone (ALDACTONE) 25 MG tablet Take 1 tablet (25 mg total) by mouth daily.    Allergies:   Ivp dye [iodinated contrast media], Penicillins, Sulfonamide derivatives, Tessalon [benzonatate], Sulfa antibiotics, Rosuvastatin calcium, and Onion   Social History   Tobacco Use   Smoking status: Never   Smokeless tobacco: Never  Vaping Use   Vaping Use: Never used  Substance Use Topics   Alcohol use: Not Currently    Comment: rare   Drug use: No    Family Hx: The patient's family history includes Atrial fibrillation in her brother and mother; Congestive Heart Failure in her brother, father, and mother; Deafness in her father and mother; Healthy in her sister, sister, and sister; Kidney Stones in her mother; Tuberculosis in her paternal grandfather. There is no history of Migraines or Neural tube defect.  ROS     Physical Exam:    VS:  BP 122/78 (BP Location: Left Arm, Patient Position: Sitting, Cuff Size: Normal)   Pulse 77   Ht 5\' 4"  (1.626 m)   Wt 191 lb (86.6 kg)   BMI 32.79 kg/m     Wt Readings from Last 3 Encounters:  08/01/22 191 lb (86.6 kg)  06/03/22 190 lb (86.2 kg)  05/19/22 184 lb 11.9 oz (83.8 kg)    Physical Exam  GEN: Obese, in no acute distress  Neck: no JVD, carotid bruits, or masses Cardiac:RRR; no murmurs, rubs, or gallops  Respiratory:  clear to auscultation bilaterally, normal work of breathing GI: soft, nontender, nondistended, + BS Ext: without cyanosis, clubbing, or edema,   Neuro:  Alert and Oriented x 3, Psych: euthymic mood, full affect        EKGs/Labs/Other Test Reviewed:    EKG:  EKG is not ordered today.     Recent Labs: 11/25/2021: B Natriuretic Peptide 147.0 05/19/2022: ALT 13 05/20/2022: BUN 19; Creatinine, Ser 0.90; Hemoglobin 14.4; Platelets 197; Potassium 4.1; Sodium 135   Recent Lipid Panel Recent Labs    05/20/22 0431  CHOL 163   TRIG 91  HDL 54  VLDL 18  LDLCALC 91     Prior CV Studies: ECHO COMPLETE WO IMAGING ENHANCING AGENT 05/20/2022  Narrative ECHOCARDIOGRAM REPORT    Patient Name:   MAGUADALUPE LATA Date of Exam: 05/20/2022 Medical Rec #:  [id number]       Height:       63.0 in Accession #:    [id number]      Weight:       184.7 lb Date of Birth:  28-Jun-1945      BSA:  1.870 m Patient Age:    76 years        BP:           112/52 mmHg Patient Gender: F               HR:           75 bpm. Exam Location:  Inpatient  Procedure: 2D Echo, Cardiac Doppler and Color Doppler  Indications:    Stroke I63.9  History:        Patient has prior history of Echocardiogram examinations, most recent 07/01/2019. Cardiomyopathy and CHF, Pacemaker, TIA, Arrythmias:Tachycardia and Atrial Fibrillation, Signs/Symptoms:Syncope, Shortness of Breath and Chest Pain; Risk Factors:Hypertension, Sleep Apnea and Dyslipidemia.  Sonographer:    Lucendia Herrlich Referring Phys: [phone] DENISE A WOLFE   Sonographer Comments: No parasternal window. IMPRESSIONS   1. Left ventricular ejection fraction, by estimation, is 30 to 35%. The left ventricle has moderately decreased function. The left ventricle demonstrates global hypokinesis. Left ventricular diastolic parameters are consistent with Grade I diastolic dysfunction (impaired relaxation). 2. Right ventricular systolic function is normal. The right ventricular size is normal. Tricuspid regurgitation signal is inadequate for assessing PA pressure. 3. The mitral valve is normal in structure. Trivial mitral valve regurgitation. No evidence of mitral stenosis. 4. The aortic valve was not well visualized. Aortic valve regurgitation is not visualized. No aortic stenosis is present. 5. The inferior vena cava is normal in size with greater than 50% respiratory variability, suggesting right atrial pressure of 3 mmHg.  FINDINGS Left Ventricle: Left ventricular ejection fraction,  by estimation, is 30 to 35%. The left ventricle has moderately decreased function. The left ventricle demonstrates global hypokinesis. The left ventricular internal cavity size was normal in size. There is no left ventricular hypertrophy. Left ventricular diastolic parameters are consistent with Grade I diastolic dysfunction (impaired relaxation).  Right Ventricle: The right ventricular size is normal. No increase in right ventricular wall thickness. Right ventricular systolic function is normal. Tricuspid regurgitation signal is inadequate for assessing PA pressure.  Left Atrium: Left atrial size was normal in size.  Right Atrium: Right atrial size was normal in size.  Pericardium: Trivial pericardial effusion is present.  Mitral Valve: The mitral valve is normal in structure. Trivial mitral valve regurgitation. No evidence of mitral valve stenosis.  Tricuspid Valve: The tricuspid valve is normal in structure. Tricuspid valve regurgitation is trivial.  Aortic Valve: The aortic valve was not well visualized. Aortic valve regurgitation is not visualized. No aortic stenosis is present. Aortic valve mean gradient measures 3.5 mmHg. Aortic valve peak gradient measures 6.3 mmHg.  Pulmonic Valve: The pulmonic valve was not well visualized. Pulmonic valve regurgitation is not visualized.  Aorta: The aortic root was not well visualized.  Venous: The inferior vena cava is normal in size with greater than 50% respiratory variability, suggesting right atrial pressure of 3 mmHg.  IAS/Shunts: The interatrial septum was not well visualized.    LV Volumes (MOD) LV vol d, MOD A2C: 99.0 ml  Diastology LV vol d, MOD A4C: 132.0 ml LV e' medial:    4.03 cm/s LV vol s, MOD A2C: 66.7 ml  LV E/e' medial:  14.7 LV vol s, MOD A4C: 78.4 ml  LV e' lateral:   5.44 cm/s LV SV MOD A2C:     32.3 ml  LV E/e' lateral: 10.9 LV SV MOD A4C:     132.0 ml LV SV MOD BP:      43.9 ml  RIGHT VENTRICLE  IVC RV  S prime:     5.00 cm/s  IVC diam: 1.00 cm TAPSE (M-mode): 2.0 cm  LEFT ATRIUM             Index        RIGHT ATRIUM           Index LA Vol (A2C):   43.1 ml 23.05 ml/m  RA Area:     18.50 cm LA Vol (A4C):   41.6 ml 22.25 ml/m  RA Volume:   49.60 ml  26.53 ml/m LA Biplane Vol: 44.2 ml 23.64 ml/m AORTIC VALVE AV Vmax:           125.50 cm/s AV Vmean:          86.000 cm/s AV VTI:            0.260 m AV Peak Grad:      6.3 mmHg AV Mean Grad:      3.5 mmHg LVOT Vmax:         124.67 cm/s LVOT Vmean:        79.200 cm/s LVOT VTI:          0.235 m LVOT/AV VTI ratio: 0.90  MITRAL VALVE MV Area (PHT): 4.06 cm     SHUNTS MV Decel Time: 187 msec     Systemic VTI: 0.23 m MV E velocity: 59.20 cm/s MV A velocity: 124.00 cm/s MV E/A ratio:  0.48  Epifanio Lesches MD Electronically signed by Epifanio Lesches MD Signature Date/Time: 05/20/2022/10:23:49 AM    Final     Coronary CTA 2022 MPRESSION: 1. Coronary calcium score of 0. This was 1st percentile for age, sex, and race matched control.   2. Normal coronary origin.  Left dominance.   3. CAD-RADS 0. No evidence of CAD (0%). Consider non-atherosclerotic causes of chest pain.   4. Dual chamber cardiac device and leads visualized.       Risk Assessment/Calculations/Metrics:    CHA2DS2-VASc Score = 8   This indicates a 10.8% annual risk of stroke. The patient's score is based upon: CHF History: 1 HTN History: 1 Diabetes History: 1 Stroke History: 2 Vascular Disease History: 0 Age Score: 2 Gender Score: 1             ASSESSMENT & PLAN:   No problem-specific Assessment & Plan notes found for this encounter.   Cerebral infarction versus transient ischemic attack Presented with sudden onset left-sided visual field deficits.  Imaging negative.  Resolved while hospitalized.  Suspect small cardioembolic event while anticoagulation was held for surgery. - Continue apixaban 5 mg twice daily     Chronic systolic  heart failure -chronic DOE but euvolemic. She has felt poorly. She will continue Entresto and her dose of her coreg to 6.25 mg bid. Didn't tolerate jardiance with drop in blood sugars  ICD - followed by Dr. Ladona Ridgel. Site stable   Obesity - trying to lose weight and will begin swimming   HTN - her bp is well controlled. She will maintain a low sodium diet.   PAF - she is maintaining NSR. She will continue her systemic anti-coag.            Dispo:  No follow-ups on file.   Medication Adjustments/Labs and Tests Ordered: Current medicines are reviewed at length with the patient today.  Concerns regarding medicines are outlined above.  Tests Ordered: No orders of the defined types were placed in this encounter.  Medication Changes: No orders of the defined types were placed  in this encounter.  Elson Clan, PA-C  08/01/2022 11:19 AM    [facility] [address] [place], [place], [place]  [address] Phone: [phone]; Fax: [phone]

## 2022-07-25 ENCOUNTER — Other Ambulatory Visit: Payer: Self-pay | Admitting: Gastroenterology

## 2022-07-25 DIAGNOSIS — N289 Disorder of kidney and ureter, unspecified: Secondary | ICD-10-CM

## 2022-07-26 DIAGNOSIS — M1712 Unilateral primary osteoarthritis, left knee: Secondary | ICD-10-CM | POA: Diagnosis not present

## 2022-07-26 DIAGNOSIS — M1711 Unilateral primary osteoarthritis, right knee: Secondary | ICD-10-CM | POA: Diagnosis not present

## 2022-07-26 DIAGNOSIS — M170 Bilateral primary osteoarthritis of knee: Secondary | ICD-10-CM | POA: Diagnosis not present

## 2022-07-27 ENCOUNTER — Other Ambulatory Visit: Payer: Self-pay | Admitting: *Deleted

## 2022-07-27 DIAGNOSIS — R911 Solitary pulmonary nodule: Secondary | ICD-10-CM

## 2022-08-01 ENCOUNTER — Encounter: Payer: Self-pay | Admitting: Physician Assistant

## 2022-08-01 ENCOUNTER — Ambulatory Visit: Payer: Medicare HMO | Attending: Internal Medicine

## 2022-08-01 ENCOUNTER — Ambulatory Visit (INDEPENDENT_AMBULATORY_CARE_PROVIDER_SITE_OTHER): Payer: Medicare HMO | Admitting: Physician Assistant

## 2022-08-01 VITALS — BP 122/78 | HR 77 | Ht 64.0 in | Wt 191.0 lb

## 2022-08-01 DIAGNOSIS — I480 Paroxysmal atrial fibrillation: Secondary | ICD-10-CM | POA: Diagnosis not present

## 2022-08-01 DIAGNOSIS — I10 Essential (primary) hypertension: Secondary | ICD-10-CM | POA: Diagnosis not present

## 2022-08-01 DIAGNOSIS — Z8673 Personal history of transient ischemic attack (TIA), and cerebral infarction without residual deficits: Secondary | ICD-10-CM | POA: Diagnosis not present

## 2022-08-01 DIAGNOSIS — I5022 Chronic systolic (congestive) heart failure: Secondary | ICD-10-CM

## 2022-08-01 DIAGNOSIS — Z95810 Presence of automatic (implantable) cardiac defibrillator: Secondary | ICD-10-CM | POA: Diagnosis not present

## 2022-08-01 DIAGNOSIS — E669 Obesity, unspecified: Secondary | ICD-10-CM | POA: Diagnosis not present

## 2022-08-01 NOTE — Patient Instructions (Signed)
Medication Instructions:  Your physician recommends that you continue on your current medications as directed. Please refer to the Current Medication list given to you today.  *If you need a refill on your cardiac medications before your next appointment, please call your pharmacy*   Lab Work: Non ordered   If you have labs (blood work) drawn today and your tests are completely normal, you will receive your results only by: MyChart Message (if you have MyChart) OR A paper copy in the mail If you have any lab test that is abnormal or we need to change your treatment, we will call you to review the results.   Testing/Procedures: None ordered    Follow-Up: At [facility], you and your health needs are our priority.  As part of our continuing mission to provide you with exceptional heart care, we have created designated Provider Care Teams.  These Care Teams include your primary Cardiologist (physician) and Advanced Practice Providers (APPs -  Physician Assistants and Nurse Practitioners) who all work together to provide you with the care you need, when you need it.  We recommend signing up for the patient portal called "MyChart".  Sign up information is provided on this After Visit Summary.  MyChart is used to connect with patients for Virtual Visits (Telemedicine).  Patients are able to view lab/test results, encounter notes, upcoming appointments, etc.  Non-urgent messages can be sent to your provider as well.   To learn more about what you can do with MyChart, go to [email or web address].    Your next appointment:   12 month(s)  Provider:   Lance Muss, MD     Other Instructions

## 2022-08-03 NOTE — Progress Notes (Signed)
EPIC Encounter for ICM Monitoring  Patient Name: Paula Booker is a 77 y.o. female Date: 08/03/2022 Primary Care Physican: Henrine Screws, MD Primary Cardiologist: Eldridge Dace Electrophysiologist: Ladona Ridgel BiV Pacing: 96.4% 02/08/2022 Weight: 183 lbs 04/21/2022 Weight: 180 lbs 06/29/2022 Weight: 181 lbs 08/01/2022 Office Weight: 191 lbs   Time in AT/AF  0.0 hr/day (0.0%)         Spoke with patient and heart failure questions reviewed.  Transmission results reviewed.  Pt reports she is doing well and sleeping better.     Optivol thoracic impedance suggesting normal fluid levels.   Prescribed:  Furosemide 40 mg take 1-2 tablets (40 mg - 80 mg total) by mouth as directed once daily for edema, SOB or weight gain.   Pt taking differently per 06/29/22: She is taking 1 tablet daily with alternating 2 tablets every other day. Spironolactone 25 mg take 1 tablet by mouth daily   Labs: 05/20/2022 Creatinine 0.90, BUN 19, Potassium 4.1, Sodium 135, GFR >60  05/19/2022 Creatinine 0.94, BUN 19, Potassium 3.7, Sodium 139, GFR >60  A complete set of results can be found in Results Review.   Recommendations:   No changes and encouraged to call if experiencing any fluid symptoms.   Follow-up plan: ICM clinic phone appointment on 09/06/2022.   91 day device clinic remote transmission 08/17/2022.     EP/Cardiology Office Visits:   08/18/2022 with Dr Ladona Ridgel.    Recall 08/14/2022 with Dr Eldridge Dace.     Copy of ICM check sent to Dr. Ladona Ridgel.     3 month ICM trend: 08/01/2022.    12-14 Month ICM trend:     Karie Soda, RN 08/03/2022 1:54 PM

## 2022-08-06 ENCOUNTER — Other Ambulatory Visit: Payer: Self-pay | Admitting: Interventional Cardiology

## 2022-08-16 ENCOUNTER — Ambulatory Visit: Payer: Medicare HMO | Admitting: Physician Assistant

## 2022-08-17 ENCOUNTER — Telehealth: Payer: Self-pay

## 2022-08-17 ENCOUNTER — Ambulatory Visit (INDEPENDENT_AMBULATORY_CARE_PROVIDER_SITE_OTHER): Payer: Medicare HMO

## 2022-08-17 DIAGNOSIS — I428 Other cardiomyopathies: Secondary | ICD-10-CM

## 2022-08-17 LAB — CUP PACEART REMOTE DEVICE CHECK
Battery Remaining Longevity: 130 mo
Battery Voltage: 3.12 V
Brady Statistic RV Percent Paced: 4.07 %
Date Time Interrogation Session: 20240508021252
HighPow Impedance: 71 Ohm
Implantable Lead Connection Status: 753985
Implantable Lead Connection Status: 753985
Implantable Lead Connection Status: 753985
Implantable Lead Implant Date: 20131212
Implantable Lead Implant Date: 20131212
Implantable Lead Implant Date: 20240205
Implantable Lead Location: 753858
Implantable Lead Location: 753859
Implantable Lead Location: 753860
Implantable Lead Model: 4598
Implantable Lead Model: 5076
Implantable Lead Model: 6935
Implantable Pulse Generator Implant Date: 20240205
Lead Channel Impedance Value: 1007 Ohm
Lead Channel Impedance Value: 342 Ohm
Lead Channel Impedance Value: 399 Ohm
Lead Channel Impedance Value: 399 Ohm
Lead Channel Impedance Value: 532 Ohm
Lead Channel Impedance Value: 608 Ohm
Lead Channel Impedance Value: 646 Ohm
Lead Channel Impedance Value: 665 Ohm
Lead Channel Impedance Value: 703 Ohm
Lead Channel Impedance Value: 855 Ohm
Lead Channel Impedance Value: 855 Ohm
Lead Channel Impedance Value: 912 Ohm
Lead Channel Impedance Value: 912 Ohm
Lead Channel Pacing Threshold Amplitude: 0.375 V
Lead Channel Pacing Threshold Amplitude: 0.625 V
Lead Channel Pacing Threshold Amplitude: 1.375 V
Lead Channel Pacing Threshold Pulse Width: 0.4 ms
Lead Channel Pacing Threshold Pulse Width: 0.4 ms
Lead Channel Pacing Threshold Pulse Width: 0.4 ms
Lead Channel Sensing Intrinsic Amplitude: 3.4 mV
Lead Channel Sensing Intrinsic Amplitude: 7.5 mV
Lead Channel Setting Pacing Amplitude: 2 V
Lead Channel Setting Pacing Amplitude: 2 V
Lead Channel Setting Pacing Amplitude: 2.5 V
Lead Channel Setting Pacing Pulse Width: 0.4 ms
Lead Channel Setting Pacing Pulse Width: 0.4 ms
Lead Channel Setting Sensing Sensitivity: 0.3 mV
Zone Setting Status: 755011
Zone Setting Status: 755011

## 2022-08-17 NOTE — Telephone Encounter (Signed)
Called pt in regards to her upcoming CT scan with [place] on 08/23/22 and her allergy to the contrast. The patient reports she normally takes 50 mg of benadryl 1 hour prior to her CT scans, no steroids, with contrast and has no reaction. The patient reports she has done this multiple times in the past and has never had a break through reaction. I spoke to dr. Charise Killian regarding this and he agreed that this patient could forgo the 13 hr prep and only take 50 mg of benadryl 1 hour prior to her appt time. I also advised the patient to have a driver the day of taking this medication as it may cause dizziness. Pt verbalized understanding.

## 2022-08-18 ENCOUNTER — Encounter: Payer: Self-pay | Admitting: Internal Medicine

## 2022-08-18 ENCOUNTER — Other Ambulatory Visit: Payer: Self-pay

## 2022-08-18 ENCOUNTER — Ambulatory Visit: Payer: Medicare HMO | Attending: Internal Medicine | Admitting: Internal Medicine

## 2022-08-18 VITALS — BP 120/68 | HR 75 | Ht 64.0 in | Wt 195.0 lb

## 2022-08-18 DIAGNOSIS — I5022 Chronic systolic (congestive) heart failure: Secondary | ICD-10-CM | POA: Diagnosis not present

## 2022-08-18 DIAGNOSIS — Z95810 Presence of automatic (implantable) cardiac defibrillator: Secondary | ICD-10-CM

## 2022-08-18 DIAGNOSIS — I480 Paroxysmal atrial fibrillation: Secondary | ICD-10-CM

## 2022-08-18 DIAGNOSIS — I10 Essential (primary) hypertension: Secondary | ICD-10-CM

## 2022-08-18 LAB — CUP PACEART INCLINIC DEVICE CHECK
Date Time Interrogation Session: 20240509195625
Implantable Lead Connection Status: 753985
Implantable Lead Connection Status: 753985
Implantable Lead Connection Status: 753985
Implantable Lead Implant Date: 20131212
Implantable Lead Implant Date: 20131212
Implantable Lead Implant Date: 20240205
Implantable Lead Location: 753858
Implantable Lead Location: 753859
Implantable Lead Location: 753860
Implantable Lead Model: 4598
Implantable Lead Model: 5076
Implantable Lead Model: 6935
Implantable Pulse Generator Implant Date: 20240205

## 2022-08-18 MED ORDER — SPIRONOLACTONE 25 MG PO TABS: 25.0000 mg | ORAL_TABLET | Freq: Every day | ORAL | 3 refills | Status: DC

## 2022-08-18 NOTE — Patient Outreach (Signed)
Aging Gracefully Program  08/18/2022  Paula Booker 06/29/1945 [id number]   [facility] Evaluation Interviewer attempted to call patient on today regarding Aging Gracefully referral. No answer from patient after multiple rings. CMA left confidential voicemail for patient to return call.  Will attempt to call back within 1 week.   Vanice Sarah Care Management Assistant [phone]

## 2022-08-18 NOTE — Progress Notes (Signed)
HPI  Mrs. Habte returns today for ongoing evaluation. She has a h/o ICM, chronic systolic heart failure, syncope, and a WPW pattern on her ECG. She underwent EP study which demonstrated no inducible arrhythmias. She underwent ICD insertion. She has a h/o generalized weakness.  She has had trouble with insomnia. Her energy level has worsened.  She admits to dietary indiscretion.     Current Outpatient Medications  Medication Sig Dispense Refill   acetaminophen (TYLENOL) 500 MG tablet Take 500 mg by mouth in the morning and at bedtime.     albuterol (VENTOLIN HFA) 108 (90 Base) MCG/ACT inhaler Inhale 2 puffs into the lungs every 6 (six) hours as needed for wheezing. 18 g 11   apixaban (ELIQUIS) 5 MG TABS tablet Take 1 tablet by mouth twice daily 180 tablet 1   Calcium Carb-Cholecalciferol 600-20 MG-MCG TABS Take 1 tablet by mouth in the morning.     carvedilol (COREG) 6.25 MG tablet Take 1 tablet by mouth twice daily 180 tablet 3   cetirizine (ZYRTEC) 10 MG tablet Take 10 mg by mouth in the morning.     Cholecalciferol (VITAMIN D3) 5000 units CAPS Take 5,000 Units by mouth in the morning.     Collagen-Vitamin C (COLLAGEN PLUS VITAMIN C PO) Take 1 tablet by mouth in the morning.     Cyanocobalamin (VITAMIN B-12) 5000 MCG LOZG Take 5,000 mcg by mouth in the morning.     escitalopram (LEXAPRO) 20 MG tablet Take 20 mg by mouth in the morning.     fluticasone furoate-vilanterol (BREO ELLIPTA) 200-25 MCG/ACT AEPB Inhale 1 puff into the lungs daily. 60 each 5   furosemide (LASIX) 40 MG tablet TAKE 1 TO 2 TABLETS BY MOUTH AS DIRECTED ONCE DAILY FOR  EDEMA,  SHORTNESS  OF  BREATH  OR  WEIGHT  GAIN (Patient taking differently: Take 40 mg by mouth 2 (two) times daily.) 180 tablet 1   ipratropium-albuterol (DUONEB) 0.5-2.5 (3) MG/3ML SOLN Take 3 mLs by nebulization every 4 (four) hours as needed. 360 mL 11   Multiple Vitamin (MULTIVITAMIN WITH MINERALS) TABS tablet Take 1 tablet by mouth in the  morning.     pantoprazole (PROTONIX) 40 MG tablet Take 40 mg by mouth daily before breakfast.  0   rosuvastatin (CRESTOR) 5 MG tablet Take 5 mg by mouth at bedtime.     sacubitril-valsartan (ENTRESTO) 24-26 MG Take 1 tablet by mouth twice daily 180 tablet 2   spironolactone (ALDACTONE) 25 MG tablet Take 1 tablet (25 mg total) by mouth daily. 90 tablet 3   No current facility-administered medications for this visit.     Past Medical History:  Diagnosis Date   AICD (automatic cardioverter/defibrillator) present    Anemia    as a child   Anxiety    Arthritis    "hands and knees" (03/22/2012)   Asthma    Basal cell carcinoma 04/27/2011   mid forehead(MOHS)   Bursitis    CHF (congestive heart failure) (HCC) 09/2011   pt. denies   Depression    Dysrhythmia    WPW   GERD (gastroesophageal reflux disease)    Head injury, acute, with loss of consciousness (HCC)    History of hiatal hernia    Hypertension    ICD (implantable cardiac defibrillator) in place 03/2012   Migraines    migraines    Nausea vomiting and diarrhea 03/18/2015   NICM (nonischemic cardiomyopathy) (HCC)    Obesity (BMI 30-39.9)  negative sleep study 2014   OSA (obstructive sleep apnea) 05/27/2018   Mild with AHI 6/hr    no cpap   Pneumonia    hx   Shortness of breath    "@ any time I can get SOB" (03/22/2012)   Skin cancer 1999   Squamous cell carcinoma of skin 07/08/2020   in situ right malar cheek-[id number]   Sudden arrhythmia death syndrome    WPW (Wolff-Parkinson-White syndrome)     ROS:   All systems reviewed and negative except as noted in the HPI.   Past Surgical History:  Procedure Laterality Date   BIOPSY  09/07/2021   Procedure: BIOPSY;  Surgeon: Kathi Der, MD;  Location: [facility];  Service: Gastroenterology;;   Earnstine Regal N/A 05/16/2022   Procedure: BIV UPGRADE;  Surgeon: Marinus Maw, MD;  Location: [facility] INVASIVE CV LAB;  Service: Cardiovascular;  Laterality: N/A;   CARDIAC  CATHETERIZATION  09/18/11   no significant CAD   CARDIAC DEFIBRILLATOR PLACEMENT  03/22/2012   DDD   CHOLECYSTECTOMY  ~ 2003   ESOPHAGOGASTRODUODENOSCOPY (EGD) WITH PROPOFOL N/A 09/07/2021   Procedure: ESOPHAGOGASTRODUODENOSCOPY (EGD) WITH PROPOFOL;  Surgeon: Kathi Der, MD;  Location: [facility] ENDOSCOPY;  Service: Gastroenterology;  Laterality: N/A;   IMPLANTABLE CARDIOVERTER DEFIBRILLATOR IMPLANT N/A 03/22/2012   Procedure: IMPLANTABLE CARDIOVERTER DEFIBRILLATOR IMPLANT;  Surgeon: Marinus Maw, MD;  Location: [facility] CATH LAB;  Service: Cardiovascular;  Laterality: N/A;   KNEE ARTHROSCOPY Left 11/29/2017   Procedure: ARTHROSCOPY LEFT KNEE;  Surgeon: Jodi Geralds, MD;  Location: [facility] ORS;  Service: Orthopedics;  Laterality: Left;   KNEE ARTHROSCOPY Right 08/09/2019   Procedure: ARTHROSCOPY RIGHT KNEE, CHONDROPLASTY,  PARTIAL MEDIAL  MENSICECTOMY;  Surgeon: Jodi Geralds, MD;  Location: [facility] ORS;  Service: Orthopedics;  Laterality: Right;   KYPHOPLASTY N/A 11/27/2013   Procedure: KYPHOPLASTY;  Surgeon: Emilee Hero, MD;  Location: [facility] OR;  Service: Orthopedics;  Laterality: N/A;  T9, T11 kyphoplasty   KYPHOPLASTY N/A 06/05/2014   Procedure: KYPHOPLASTY;  Surgeon: Emilee Hero, MD;  Location: [facility] OR;  Service: Orthopedics;  Laterality: N/A;  T7 kyphoplasty   MOHS SURGERY  06/2011   "forehead" (03/22/2012)   SKIN CANCER EXCISION  1999   "top of my head and my right back shoulder"   SKIN CANCER EXCISION     back   SKIN CANCER EXCISION     face   SUPRAVENTRICULAR TACHYCARDIA ABLATION  03/22/2012   unable to induce VT/RN report 03/22/2012   SUPRAVENTRICULAR TACHYCARDIA ABLATION N/A 03/22/2012   Procedure: SUPRAVENTRICULAR TACHYCARDIA ABLATION;  Surgeon: Marinus Maw, MD;  Location: [facility] CATH LAB;  Service: Cardiovascular;  Laterality: N/A;   TONSILLECTOMY AND ADENOIDECTOMY  1950   TUBAL LIGATION  1978     Family History  Problem Relation Age of Onset   Congestive Heart Failure Mother         died 4   Kidney Stones Mother    Deafness Mother    Atrial fibrillation Mother    Congestive Heart Failure Father    Deafness Father    Healthy Sister    Healthy Sister    Healthy Sister    Congestive Heart Failure Brother    Atrial fibrillation Brother    Tuberculosis Paternal Grandfather    Migraines Neg Hx    Neural tube defect Neg Hx      Social History   Socioeconomic History   Marital status: Divorced    Spouse name: Not on file   Number of children:  3   Years of education: Not on file   Highest education level: Not on file  Occupational History   Occupation: retired    Comment: Airline pilot  Tobacco Use   Smoking status: Never   Smokeless tobacco: Never  Vaping Use   Vaping Use: Never used  Substance and Sexual Activity   Alcohol use: Not Currently    Comment: rare   Drug use: No   Sexual activity: Not Currently  Other Topics Concern   Not on file  Social History Narrative   Divorced, 3 children, 9 grandchildren. Did work for [employer]. Retired at 77 y/o .   Social Determinants of Health   Financial Resource Strain: Not on file  Food Insecurity: No Food Insecurity (05/19/2022)   Hunger Vital Sign    Worried About Running Out of Food in the Last Year: Never true    Ran Out of Food in the Last Year: Never true  Transportation Needs: No Transportation Needs (05/19/2022)   PRAPARE - Administrator, Civil Service (Medical): No    Lack of Transportation (Non-Medical): No  Physical Activity: Not on file  Stress: Not on file  Social Connections: Not on file  Intimate Partner Violence: Not At Risk (05/19/2022)   Humiliation, Afraid, Rape, and Kick questionnaire    Fear of Current or Ex-Partner: No    Emotionally Abused: No    Physically Abused: No    Sexually Abused: No     BP 120/68   Pulse 75   Ht 5\' 4"  (1.626 m)   Wt 195 lb (88.5 kg)   SpO2 96%   BMI 33.47 kg/m   Physical Exam:  Well appearing NAD HEENT:  Unremarkable Neck:  No JVD, no thyromegally Lymphatics:  No adenopathy Back:  No CVA tenderness Lungs:  Clear HEART:  Regular rate rhythm, no murmurs, no rubs, no clicks Abd:  soft, positive bowel sounds, no organomegally, no rebound, no guarding Ext:  2 plus pulses, no edema, no cyanosis, no clubbing Skin:  No rashes no nodules Neuro:  CN II through XII intact, motor grossly intact  EKG - P synchronous biv pacing  DEVICE  Normal device function.  See PaceArt for details.   Assess/Plan: 1. Chronic systolic heart failure -her symptoms are class 2A. She has felt poorly. She will continue Entresto and her dose of her coreg to 6.25 mg bid. 2. ICD - her DDD ICD is working normally and she has just over 2 years of battery longevity. 3. Obesity - I encouraged her to lose weight.  She is up about 10 lbs. 4. HTN - her bp is well controlled. She will maintain a low sodium diet. 5. PAF - she is maintaining NSR. She will continue her systemic anti-coag.   Sharlot Gowda Kaylah Chiasson,MD

## 2022-08-18 NOTE — Patient Instructions (Addendum)
Medication Instructions:  Your physician recommends that you continue on your current medications as directed. Please refer to the Current Medication list given to you today.  Your Aldactone 25 mg was refilled today and sent to your pharmacy.   Lab Work: None ordered.  If you have labs (blood work) drawn today and your tests are completely normal, you will receive your results only by: MyChart Message (if you have MyChart) OR A paper copy in the mail If you have any lab test that is abnormal or we need to change your treatment, we will call you to review the results.  Testing/Procedures: None ordered.  Follow-Up: At [facility], you and your health needs are our priority.  As part of our continuing mission to provide you with exceptional heart care, we have created designated Provider Care Teams.  These Care Teams include your primary Cardiologist (physician) and Advanced Practice Providers (APPs -  Physician Assistants and Nurse Practitioners) who all work together to provide you with the care you need, when you need it.  We recommend signing up for the patient portal called "MyChart".  Sign up information is provided on this After Visit Summary.  MyChart is used to connect with patients for Virtual Visits (Telemedicine).  Patients are able to view lab/test results, encounter notes, upcoming appointments, etc.  Non-urgent messages can be sent to your provider as well.   To learn more about what you can do with MyChart, go to [email or web address].    Your next appointment:   1 year(s)  The format for your next appointment:   In Person  Provider:   Lewayne Bunting, MD{or one of the following Advanced Practice Providers on your designated Care Team:   Francis Dowse, [place] Casimiro Needle "Mardelle Matte" Lanna Poche, [place]  Remote monitoring is used to monitor your ICD from home. This monitoring reduces the number of office visits required to check your device to one time per year. It allows [place] to keep an  eye on the functioning of your device to ensure it is working properly. You are scheduled for a device check from home on 5/28. You may send your transmission at any time that day. If you have a wireless device, the transmission will be sent automatically. After your physician reviews your transmission, you will receive a postcard with your next transmission date.

## 2022-08-23 ENCOUNTER — Ambulatory Visit
Admission: RE | Admit: 2022-08-23 | Discharge: 2022-08-23 | Disposition: A | Discharge status: Home / Self Care | Payer: Medicare HMO | Source: Ambulatory Visit | Attending: Gastroenterology | Admitting: Gastroenterology

## 2022-08-23 DIAGNOSIS — I700 Atherosclerosis of aorta: Secondary | ICD-10-CM | POA: Diagnosis not present

## 2022-08-23 DIAGNOSIS — N281 Cyst of kidney, acquired: Secondary | ICD-10-CM | POA: Diagnosis not present

## 2022-08-23 DIAGNOSIS — N289 Disorder of kidney and ureter, unspecified: Secondary | ICD-10-CM

## 2022-08-23 MED ORDER — IOPAMIDOL (ISOVUE-300) INJECTION 61%
100.0000 mL | Freq: Once | INTRAVENOUS | Status: AC | PRN
  Administered 2022-08-23: 100 mL via INTRAVENOUS

## 2022-08-29 NOTE — Addendum Note (Signed)
Addended by: Kaspar Albornoz, Casanova Schurman S on: 08/29/2022 01:18 PM   Modules accepted: Orders  

## 2022-09-02 ENCOUNTER — Other Ambulatory Visit: Payer: Self-pay

## 2022-09-02 NOTE — Patient Outreach (Signed)
Aging Gracefully Program  09/02/2022  Paula Booker Aug 23, 1945 [id number]   [facility] Evaluation Interviewer made contact with patient. Aging Gracefully survey completed.   Interviewer will send referral to RN and OT for follow up.   Paula Booker Care Management Assistant [phone]

## 2022-09-06 ENCOUNTER — Ambulatory Visit: Payer: Medicare HMO | Attending: Internal Medicine

## 2022-09-06 DIAGNOSIS — I5022 Chronic systolic (congestive) heart failure: Secondary | ICD-10-CM

## 2022-09-06 DIAGNOSIS — Z95810 Presence of automatic (implantable) cardiac defibrillator: Secondary | ICD-10-CM

## 2022-09-07 NOTE — Progress Notes (Signed)
EPIC Encounter for ICM Monitoring  Patient Name: Paula Booker is a 77 y.o. female Date: 09/07/2022 Primary Care Physican: Henrine Screws, MD Primary Cardiologist: Eldridge Dace Electrophysiologist: Ladona Ridgel BiV Pacing: 96.1% 02/08/2022 Weight: 183 lbs 04/21/2022 Weight: 180 lbs 06/29/2022 Weight: 181 lbs 08/01/2022 Office Weight: 191 lbs 09/07/2022 Weight: 191 lbs   Time in AT/AF  0.0 hr/day (0.0%)         Spoke with patient and heart failure questions reviewed.  Transmission results reviewed.  Pt asymptomatic for fluid accumulation.  Reports feeling well at this time and voices no complaints.       Optivol thoracic impedance suggesting possible fluid accumulation starting 5/15 and also from 5/4-5/10.   Prescribed:  Furosemide 40 mg take 1-2 tablets (40 mg - 80 mg total) by mouth as directed once daily for edema, SOB or weight gain.   Pt taking differently per 06/29/22: She is taking 1 tablet daily with alternating 2 tablets every other day. Spironolactone 25 mg take 1 tablet by mouth daily   Labs: 05/20/2022 Creatinine 0.90, BUN 19, Potassium 4.1, Sodium 135, GFR >60  05/19/2022 Creatinine 0.94, BUN 19, Potassium 3.7, Sodium 139, GFR >60  A complete set of results can be found in Results Review.   Recommendations:  She will take extra fluid pill to help with fluid accumulation.   Recommendation to limit salt intake to 2000 mg daily and fluid intake to 64 oz daily.  Encouraged to call if experiencing any fluid symptoms.    Follow-up plan: ICM clinic phone appointment on 09/12/2022 to recheck fluid levels.   91 day device clinic remote transmission 11/16/2022.     EP/Cardiology Office Visits:   Recall 08/2023 with Dr Ladona Ridgel.    Recall 07/27/2023 with Dr Eldridge Dace.     Copy of ICM check sent to Dr. Ladona Ridgel.     3 month ICM trend: 09/06/2022.    12-14 Month ICM trend:     Karie Soda, RN 09/07/2022 9:28 AM

## 2022-09-07 NOTE — Progress Notes (Signed)
Remote ICD transmission.   

## 2022-09-12 ENCOUNTER — Ambulatory Visit: Payer: Medicare HMO | Attending: Internal Medicine

## 2022-09-12 DIAGNOSIS — I5022 Chronic systolic (congestive) heart failure: Secondary | ICD-10-CM

## 2022-09-12 DIAGNOSIS — Z95810 Presence of automatic (implantable) cardiac defibrillator: Secondary | ICD-10-CM

## 2022-09-12 DIAGNOSIS — M109 Gout, unspecified: Secondary | ICD-10-CM | POA: Diagnosis not present

## 2022-09-13 ENCOUNTER — Other Ambulatory Visit: Payer: Self-pay | Admitting: Internal Medicine

## 2022-09-13 NOTE — Progress Notes (Signed)
EPIC Encounter for ICM Monitoring  Patient Name: Paula Booker is a 77 y.o. female Date: 09/13/2022 Primary Care Physican: Henrine Screws, MD Primary Cardiologist: Eldridge Dace Electrophysiologist: Ladona Ridgel BiV Pacing: 94% 02/08/2022 Weight: 183 lbs 04/21/2022 Weight: 180 lbs 06/29/2022 Weight: 181 lbs 08/01/2022 Office Weight: 191 lbs 09/07/2022 Weight: 191 lbs   Time in AT/AF  0.0 hr/day (0.0%)         Spoke with patient and heart failure questions reviewed.  Transmission results reviewed.  Pt asymptomatic for fluid accumulation.  Reports feeling well at this time and voices no complaints.       Optivol thoracic impedance suggesting fluid levels returned to normal after taking extra fluid pill.   Prescribed:  Furosemide 40 mg take 1-2 tablets (40 mg - 80 mg total) by mouth as directed once daily for edema, SOB or weight gain.   Pt taking differently per 06/29/22: She is taking 1 tablet daily with alternating 2 tablets every other day. Spironolactone 25 mg take 1 tablet by mouth daily   Labs: 05/20/2022 Creatinine 0.90, BUN 19, Potassium 4.1, Sodium 135, GFR >60  05/19/2022 Creatinine 0.94, BUN 19, Potassium 3.7, Sodium 139, GFR >60  A complete set of results can be found in Results Review.   Recommendations: Encouraged to call if experiencing any fluid symptoms.    Follow-up plan: ICM clinic phone appointment on 10/10/2022.   91 day device clinic remote transmission 11/16/2022.     EP/Cardiology Office Visits:   Recall 08/18/2023 with Dr Ladona Ridgel.    Recall 07/27/2023 with Dr Eldridge Dace.     Copy of ICM check sent to Dr. Ladona Ridgel.     3 month ICM trend: 09/12/2022.    12-14 Month ICM trend:     Karie Soda, RN 09/13/2022 3:18 PM

## 2022-09-14 ENCOUNTER — Other Ambulatory Visit: Payer: Self-pay | Admitting: Internal Medicine

## 2022-09-14 DIAGNOSIS — I480 Paroxysmal atrial fibrillation: Secondary | ICD-10-CM

## 2022-09-14 NOTE — Telephone Encounter (Signed)
Prescription refill request for Eliquis received. Indication: AF Last office visit: 08/18/22  Rosette Reveal MD Scr: 0.90 on 05/20/22  Epic Age: 77 Weight: 88.5kg  Based on above findings Eliquis 5mg  twice daily is the appropriate dose.  Refill approved.

## 2022-09-16 ENCOUNTER — Other Ambulatory Visit: Payer: Self-pay | Admitting: Specialist

## 2022-09-19 NOTE — Patient Outreach (Signed)
Aging Gracefully Program  OT Initial Visit  09/16/2022  Paula Booker [birth year] [id number]  Visit:  1- Initial Visit  Start Time:  1500 End Time:  1645 Total Minutes:  105  CCAP: Typical Daily Routine: Typical Daily Routine:: Paula Booker lives alone and has great family support, as well as friend support.  She enjoys crafting, painting, woodworking, sewing, quilting, and reading.  She does not have assistance with ADLs and is still driving.  She has had less energy over the passed several years and is having a sleep study completed this coming week. What Types Of Care Problems Are You Having Throughout The Day?: tires easily, doesnt have energy to complete heavy cleaning, or complete some of the projects/crafts that she wishes to complete. What Kind Of Help Do You Receive?: grandson helps with yardwork Do You Think You Need Other Types Of Help?: no What Do You Think Would Make Everyday Life Easier For You?: improved sleep and more energy What Is A Good Day Like?: no back pain and plenty of energy What Is A Bad Day Like?: increased pain and decreased energy Do You Have Time For Yourself?: yes Patient Reported Equipment: Patient Reported Equipment Currently Used: Grab Bars, Reacher Functional Mobility-Walking Indoors/Getting Around the [employer]:   Functional Mobility-Walk A Block: Walk A Block: A Little Difficulty Importance Of Learning New Strategies:: Not At All Functional Mobility-Maintain Balance While Showering: Maintaining Balance While Showering: A Little Difficulty Do You:: Use A Device Other Comments:: has a shower chair Functional Mobility-Stooping, Crouching, Kneeling To Retreive Item: Stooping, Crouching, or Kneeling To Retrieve Item: A Little Difficulty Do You:: Use A Device Importance Of Learning New Strategies:: Not At All Other Comments:: has a Nurse, adult Mobility-Bending From Standing Position To Pick Up Clothing Off The Floor: Bending Over From Standing  Position To Pick Up Clothing Off The Floor: A Little Difficulty Do You:: Use A Device Importance Of Learning New Strategies:: Not At All Other Comments:: has a reacher Functional Mobility-Reaching For Items Above Shoulder Level: Reaching For Items Above Shoulder Level: No Difficulty Functional Mobility-Climb 1 Flight Of Stairs: Climb 1 Flight Of Stairs: A Little Difficulty Importance Of Learning New Strategies:: Not At All Functional Mobility-Move In And Out Of Chair: Move In and Out Of A Chair: No Difficulty Functional Mobility-Move In And Out Of Bed: Move In and Out Of Bed: No Difficulty Functional Mobility-Move In And Out Of Bath/Shower: Move In And Out Of A Bath/Shower: A Lot Of Difficulty Do You:: Use A Device Importance Of Learning New Strategies:: Very Much Other Comments:: has one grab bar, height of tub wall is very high Observation: Move In And Out Of Bath/Shower: Independent With Pain, Difficulty, Or Use Of Device Safety: A Little Risk Efficiency: Not At All Intervention: Yes Functional Mobility-Get On And Off Toilet: Getting Up From The Floor: A Little Difficulty Do You:: No Device/No Assistance Importance Of Learning New Strategies:: Not At All Functional Mobility-Into And Out Of Car, Not Including Driving: Into  And Out Of Car, Not Including Driving: No Difficulty Functional Mobility-Other Mobility Difficulty: Other Mobility Identified:: Getting in and out of home Mobility Other Difficulty: A Little Difficulty Do You:: No Device/No Assistance Importance Of Learning New Strategies:: Very Much Observation: Other Mobility Identified: Independent With Pain, Difficulty, Or Use Of Device Safety: A Little Risk Efficiency: Somewhat Intervention: Yes Other Comments:: wants smaller height steps vs a ramp, does not want ramp at front door, wants at side    Activities of Daily Living-Bathing/Showering: ADL-Bathing/Showering:  No Difficulty Activities of Daily  Living-Personal Hygiene and Grooming: Personal Hygiene and Grooming: No Difficulty Activities of Daily Living-Toilet Hygiene: Toilet Hygiene: No Difficulty Activities of Daily Living-Put On And Take Off Undergarments (Incl. Fasteners): Put On And Take Off Undergarments (Incl. Fasteners): No Difficulty Activities of Daily Living-Put On And Take Off Shirt/Dress/Coat (Incl. Fasteners): Put On And Take Off Shirt/Dress/Coat (Incl. Fasteners): No Difficulty Activities of Daily Living-Put On And Take Off Socks And Shoes: Put On And Take Off Socks And  Shoes: No Difficulty Activities of Daily Living-Feed Self: Feed Self: No Difficulty Activities of Daily Living-Rest And Sleep: Rest and Sleep: A Lot of Difficulty Do You:: No Device/No Assistance Importance Of Learning New Strategies: Very Much ADL Observation: Rest And Sleep: N/O Intervention: Yes Activities of Daily Living-Sexual Activity: Sexual  Activity: N/A Activities of Daily Living-Other Activity Identified:    Instrumental Activities of Daily Living-Light Homemaking (Laundry, Straightening Up, Vacuuming):  Do Light Homemaking (Laundry, Straightening Up, Vacuuming): A Little Difficulty Do You:: No Device/No Assistance Importance Of Learning New Strategies: A Little Other Comments:: energy conservation vs adaption Instrumental Activities of Daily Living-Making A Bed: Making a Bed: No Difficulty Instrumental Activities of Daily Living-Washing Dishes By Hand While Standing At The Sink: Washing Dishes By Hand While Standing At The Sink: No Difficulty Instrumental Activities of Daily Living-Grocery Shopping: Do Grocery Shopping: No Difficulty Instrumental Activities of Daily Living-Use Telephone: Use Telephone: No Difficulty Instrumental Activities of Daily Living-Financial Management: Financial Management: No Difficulty Instrumental Activities of Daily Living-Medications: Take Medications: No Difficulty Instrumental Activities of  Daily Living-Health Management And Maintenance: Health Management & Maintenance: No Difficulty Instrumental Activities of Daily Living-Meal Preparation and Clean-Up: Meal Preparation and Clean-Up: No Difficulty Instrumental Activities of Daily Living-Provide Care For Others/Pets: Care For Others/Pets: N/A Instrumental Activities of Daily Living-Take Part In Organized Social Activities: Take Part In Organized Social Activities: No Difficulty Instrumental Activities of Daily Living-Leisure Participation: Leisure Participation: No Difficulty Instrumental Activities of Daily Living-Employment/Volunteer Activities: Employment/Volunteer Activities: N/A Instrumental Activities of Daily Living-Other Identifies:    Readiness To Change Score:  Readiness to Change Score: 10  Home Environment Assessment: Outside Home Entry:: steps to side entrance are uneven Entryway/Foyer:: somewhat cluttered Dining Room:: [facility] Living Room:: [facility] Kitchen:: [facility] Stairs:: has stairs to second floor and does not use Bathroom:: tub wall is very high and difficult to step over Master Bedroom:: bed is high Laundry:: WFL Basement:: n/a Hallways:: WFL Smoke/CO2 Detector:: unknown Scientist, forensic:: unknown Mailbox:: at side of house Other Home Environment Concerns:: consider redoing steps vs ramp  Durable Medical Equipment:    Patient Education: Education Provided: Yes Education Details: Check For Safety to avoid falls Person(s) Educated: Patient Comprehension: Verbalized Understanding  Goals:  Goals Addressed             This Visit's Progress    Patient Stated       Improve quality of sleep     Patient Stated       Improve energy level by applying energy conservation techniques to daily tasks.     patient stated       Improve safety entering and exiting home.     Patient Stated       Improve safety entering and exiting tub.        Post Clinical Reasoning: Clinician View Of Client  Situation:: Ms. Baka has strategies in place to keep herself as safe as possible.  She has felt tired for sometime, however has had a recent improvement in  energy, although would benefit from tips on energy conservation to avoid overdoing.  She has many leisure tasks and a great support system. Client View Of His/Her Situation:: Has many plans and does not always have the energy to carry them out, would like to improve energy.  Is cognizant that while she functions well in her home currently, modifications will be beneficial for years to come. Next Visit Plan:: Problem solve goal 1, improving sleep and energy conservation.  Review how to safely get up from a fall.  Shirlean Mylar, MHA, OT/L [phone]

## 2022-09-21 ENCOUNTER — Telehealth: Payer: Self-pay

## 2022-09-21 NOTE — Patient Outreach (Signed)
Aging Gracefully Program  09/21/2022  BRANDILYNN TAORMINA 1945-12-14 [id number]   Placed call to patient to offer first RN home visit.  No answer. Left a message for patient to return my call.  Plan: will await a call back from patient.   Rowe Pavy RN, BSN, Careers adviser for [employer] Mobile: [phone]

## 2022-09-21 NOTE — Patient Outreach (Signed)
Aging Gracefully Program  09/21/2022  SILVER ACHEY 14-Jun-1945 [id number]   Message received from patient. Returned call. Explained reason for call.  Home visit scheduled for 09/30/2022  Rowe Pavy RN, BSN, CEN RN Case Manager for [employer] Mobile: [phone]

## 2022-09-27 DIAGNOSIS — I110 Hypertensive heart disease with heart failure: Secondary | ICD-10-CM | POA: Diagnosis not present

## 2022-09-27 DIAGNOSIS — R739 Hyperglycemia, unspecified: Secondary | ICD-10-CM | POA: Diagnosis not present

## 2022-09-27 DIAGNOSIS — E782 Mixed hyperlipidemia: Secondary | ICD-10-CM | POA: Diagnosis not present

## 2022-09-27 DIAGNOSIS — K7460 Unspecified cirrhosis of liver: Secondary | ICD-10-CM | POA: Diagnosis not present

## 2022-09-27 DIAGNOSIS — I10 Essential (primary) hypertension: Secondary | ICD-10-CM | POA: Diagnosis not present

## 2022-09-27 DIAGNOSIS — F331 Major depressive disorder, recurrent, moderate: Secondary | ICD-10-CM | POA: Diagnosis not present

## 2022-09-27 DIAGNOSIS — I480 Paroxysmal atrial fibrillation: Secondary | ICD-10-CM | POA: Diagnosis not present

## 2022-09-27 DIAGNOSIS — I428 Other cardiomyopathies: Secondary | ICD-10-CM | POA: Diagnosis not present

## 2022-09-27 DIAGNOSIS — I700 Atherosclerosis of aorta: Secondary | ICD-10-CM | POA: Diagnosis not present

## 2022-09-27 DIAGNOSIS — I5022 Chronic systolic (congestive) heart failure: Secondary | ICD-10-CM | POA: Diagnosis not present

## 2022-09-30 ENCOUNTER — Other Ambulatory Visit: Payer: Self-pay

## 2022-09-30 NOTE — Patient Outreach (Signed)
Aging Gracefully Program  RN Visit  09/30/2022  Paula Booker August 02, 1945 [id number]  Visit:  RN Visit Number: 1- Initial Visit  RN TIME CALCULATION: Start TIme:  RN Start Time Calculation: 1100 End Time:  RN Stop Time Calculation: 1230 Total Minutes:  RN Time Calculation: 90  Readiness To Change Score:     Universal RN Interventions: Calendar Distribution: Yes Exercise Review: No Medications: Yes Medication Changes: No Mood: Yes Pain: Yes PCP Advocacy/Support: No Fall Prevention: Yes Incontinence: Yes Clinician View Of Client Situation: Outside of home overgrown. Steps to back of the house difficult to climb.  Patient is very pleasant. lives alone. sews and does crafts. Patient self manages her health well and is knowledgable about her health conditons and medications. Client View Of His/Her Situation: Patient reports she does well. Family all live out of town. Patient reports that she has difficulty getting task completed at times.  States she gets overhwhelmed with helping others and has a difficulty in saying no.  Has frequent dizzy spells.  1 recent fall. Drives herself to appointments. Weighs daily and knows when to call MD>  Healthcare Provider Communication: Did Surveyor, mining With [employer] Provider?: No According to Client, Did PCP Report Communication With An Aging Gracefully RN?: No  Clinician View of Client Situation: Clinician View Of Client Situation: Outside of home overgrown. Steps to back of the house difficult to climb.  Patient is very pleasant. lives alone. sews and does crafts. Patient self manages her health well and is knowledgable about her health conditons and medications. Client's View of His/Her Situation: Client View Of His/Her Situation: Patient reports she does well. Family all live out of town. Patient reports that she has difficulty getting task completed at times.  States she gets overhwhelmed with helping others and has a difficulty in  saying no.  Has frequent dizzy spells.  1 recent fall. Drives herself to appointments. Weighs daily and knows when to call MD>  Medication Assessment: Do You Have Any Problems Paying For Medications?: No Where Does Client Store Medications?: Other: (bedroom in closet) Can Client Read Pill Bottles?: No Does Client Use A Pillbox?: Yes Does Anyone Assist Client In Filling Pillbox?: No Does Anyone Assist Client In Taking Medications?: No Do You Take Vitamin D?: Yes Does Client Have Any Questions Or Concerns About Medictions?: No Is Client Complaining Of Any Symptoms That Could Be Side Effects To Medications?: No Any Possible Changes In Medication Regimen?: No  Outpatient Encounter Medications as of 09/30/2022  Medication Sig Note   albuterol (VENTOLIN HFA) 108 (90 Base) MCG/ACT inhaler Inhale 2 puffs into the lungs every 6 (six) hours as needed for wheezing.    apixaban (ELIQUIS) 5 MG TABS tablet Take 1 tablet by mouth twice daily    Calcium Carb-Cholecalciferol 600-20 MG-MCG TABS Take 1 tablet by mouth in the morning.    carvedilol (COREG) 6.25 MG tablet Take 1 tablet by mouth twice daily    cetirizine (ZYRTEC) 10 MG tablet Take 10 mg by mouth in the morning.    Cholecalciferol (VITAMIN D3) 5000 units CAPS Take 5,000 Units by mouth in the morning.    Collagen-Vitamin C (COLLAGEN PLUS VITAMIN C PO) Take 1 tablet by mouth in the morning.    Cyanocobalamin (VITAMIN B-12) 5000 MCG LOZG Take 5,000 mcg by mouth in the morning.    escitalopram (LEXAPRO) 20 MG tablet Take 20 mg by mouth in the morning.    fluticasone furoate-vilanterol (BREO ELLIPTA) 200-25 MCG/ACT AEPB Inhale 1  puff into the lungs daily.    furosemide (LASIX) 40 MG tablet TAKE 1 TO 2 TABLETS BY MOUTH AS DIRECTED ONCE DAILY FOR  EDEMA,  SHORTNESS  OF  BREATH  OR  WEIGHT  GAIN 10-04-2022: Takes 80 mg very day.    ipratropium-albuterol (DUONEB) 0.5-2.5 (3) MG/3ML SOLN Take 3 mLs by nebulization every 4 (four) hours as needed.     Multiple Vitamin (MULTIVITAMIN WITH MINERALS) TABS tablet Take 1 tablet by mouth in the morning.    pantoprazole (PROTONIX) 40 MG tablet Take 40 mg by mouth 2 (two) times daily.    rosuvastatin (CRESTOR) 5 MG tablet Take 5 mg by mouth at bedtime.    sacubitril-valsartan (ENTRESTO) 24-26 MG Take 1 tablet by mouth twice daily    spironolactone (ALDACTONE) 25 MG tablet Take 1 tablet (25 mg total) by mouth daily.    acetaminophen (TYLENOL) 500 MG tablet Take 500 mg by mouth in the morning and at bedtime. (Patient not taking: Reported on 10-04-2022)    No facility-administered encounter medications on file as of Oct 04, 2022.     OT Update: pending home modifications from [employer].   Session Summary: Very pleasant patient who is active and enjoys making things. Needs help with outside maintenance of her home.    Goals Addressed               This Visit's Progress     AG RN (pt-stated)        Goals:  Patient reports she would like to have improved energy in the next 160 days.   10-04-22 Assessment:  Reports decease energy for a while. States she does things for others and has a hard time saying no.  Reports she has belongings that belong to others.   Interventions; reviewed need to set deadlines to get things moved out by her family.  Provided brainstorming to problem solve. Talked about making a list and trying to complete task.  Reviewed healthy diet.  Reviewed hydration. Reviewed doing task earlier in the am when outside. Provided [facility] Calendar.   Plan: 11/01/2022 next home visit.  Rowe Pavy RN, BSN, Careers adviser for [employer] Mobile: [phone]       Rowe Pavy RN, BSN, Careers adviser for [employer] Mobile: [phone]

## 2022-09-30 NOTE — Patient Instructions (Signed)
Visit Information  Thank you for taking time to visit with me today. Please don't hesitate to contact me if I can be of assistance to you before our next scheduled home appointment.  Following are the goals we discussed today:   Goals Addressed               This Visit's Progress     AG RN (pt-stated)        Goals:  Patient reports she would like to have improved energy in the next 160 days.   22-Oct-2022 Assessment:  Reports decease energy for a while. States she does things for others and has a hard time saying no.  Reports she has belongings that belong to others.   Interventions; reviewed need to set deadlines to get things moved out by her family.  Provided brainstorming to problem solve. Talked about making a list and trying to complete task.  Reviewed healthy diet.  Reviewed hydration. Reviewed doing task earlier in the am when outside. Provided [facility] Calendar.   Plan: 11/01/2022 next home visit.  Rowe Pavy RN, BSN, CEN RN Case Production designer, theatre/television/film for Aging Gracefully [facility] Mobile: [phone]          Our next appointment is on 11/01/2022 at 1130am  If you are experiencing a Mental Health or Behavioral Health Crisis or need someone to talk to, please call the Suicide and Crisis Lifeline: 988 call the [place] National Suicide Prevention Lifeline: 613-714-0253 or TTY: (910)858-4649 TTY (410) 175-5883) to talk to a trained counselor call 1-800-273-TALK (toll free, 24 hour hotline) go to [facility] [address] [place] [phone]) call 911    Rowe Pavy RN, BSN, CEN RN Case Production designer, theatre/television/film for Aging Chemical [employer] Mobile: [phone]

## 2022-10-02 DIAGNOSIS — G4761 Periodic limb movement disorder: Secondary | ICD-10-CM | POA: Diagnosis not present

## 2022-10-02 DIAGNOSIS — G4733 Obstructive sleep apnea (adult) (pediatric): Secondary | ICD-10-CM | POA: Diagnosis not present

## 2022-10-04 DIAGNOSIS — D49511 Neoplasm of unspecified behavior of right kidney: Secondary | ICD-10-CM | POA: Diagnosis not present

## 2022-10-04 DIAGNOSIS — E119 Type 2 diabetes mellitus without complications: Secondary | ICD-10-CM | POA: Diagnosis not present

## 2022-10-10 ENCOUNTER — Ambulatory Visit: Payer: Medicare HMO | Attending: Internal Medicine

## 2022-10-10 DIAGNOSIS — Z95810 Presence of automatic (implantable) cardiac defibrillator: Secondary | ICD-10-CM

## 2022-10-10 DIAGNOSIS — I5022 Chronic systolic (congestive) heart failure: Secondary | ICD-10-CM

## 2022-10-12 NOTE — Progress Notes (Signed)
EPIC Encounter for ICM Monitoring  Patient Name: Paula Booker is a 77 y.o. female Date: 10/12/2022 Primary Care Physican: Henrine Screws, MD Primary Cardiologist: Eldridge Dace Electrophysiologist: Ladona Ridgel BiV Pacing: 94.5% 02/08/2022 Weight: 183 lbs 04/21/2022 Weight: 180 lbs 06/29/2022 Weight: 181 lbs 08/01/2022 Office Weight: 191 lbs 09/07/2022 Weight: 191 lbs   Time in AT/AF  0.0 hr/day (0.0%)         Spoke with patient and heart failure questions reviewed.  Transmission results reviewed.  Pt asymptomatic for fluid accumulation. She reports having asthma flare this morning and just finished nebulizer treatment.  Advised to use ER if breathing becomes worse.   Optivol thoracic impedance suggesting normal fluid levels with the exception of possible fluid accumulation from 6/4-6/15.   Prescribed:  Furosemide 40 mg take 1-2 tablets (40 mg - 80 mg total) by mouth as directed once daily for edema, SOB or weight gain.   Pt taking differently per 06/29/22: She is taking 1 tablet daily with alternating 2 tablets every other day. Spironolactone 25 mg take 1 tablet by mouth daily   Labs: 05/20/2022 Creatinine 0.90, BUN 19, Potassium 4.1, Sodium 135, GFR >60  05/19/2022 Creatinine 0.94, BUN 19, Potassium 3.7, Sodium 139, GFR >60  A complete set of results can be found in Results Review.   Recommendations:   No changes and encouraged to call if experiencing any fluid symptoms.   Follow-up plan: ICM clinic phone appointment on 11/14/2022.   91 day device clinic remote transmission 11/16/2022.     EP/Cardiology Office Visits:   Recall 08/18/2023 with Dr Ladona Ridgel.    Recall 07/27/2023 with Dr Eldridge Dace.     Copy of ICM check sent to Dr. Ladona Ridgel.      3 month ICM trend: 10/10/2022.    12-14 Month ICM trend:     Karie Soda, RN 10/12/2022 8:09 AM

## 2022-10-17 ENCOUNTER — Telehealth: Payer: Self-pay

## 2022-10-17 NOTE — Patient Outreach (Signed)
Aging Gracefully Program  10/17/2022  JEE SOPPE 10-19-1945 [id number]   Placed call to patient to reschedule appointment.  Appointment changed to 11/04/2022.  Rowe Pavy RN, BSN, Careers adviser for [employer] Mobile: [phone]

## 2022-10-21 ENCOUNTER — Encounter: Payer: Self-pay | Admitting: Specialist

## 2022-10-31 ENCOUNTER — Other Ambulatory Visit: Payer: Self-pay | Admitting: Specialist

## 2022-10-31 NOTE — Patient Outreach (Signed)
Aging Gracefully Program  OT Follow-Up Visit  10/31/2022  Paula Booker [birth year] [id number]  Visit:  2- Second Visit  Start Time:  1500 End Time:  1635 Total Minutes:  95     Patient Education: Education Provided: Yes Education Details: Reviewed tips for getting up safely from a fall Person(s) Educated: Patient Comprehension: Verbalized Understanding, Returned Demonstration  Goals:   Goals Addressed             This Visit's Progress    Patient Stated       Improve energy level by applying energy conservation techniques to daily tasks.  Name _____________________________              Date__________________  OT Action Plan: Generic  Target Problem Area:   Decreased energy level  Why Problem May Occur:     Breathing issues   Poor quality and amount of sleep          Target Goal(s):   Independently apply energy conservation principles to daily tasks.    STRATEGIES   Saving your Energy DO:  Follow 4 P's:  prioritize, pace, positioning, and plan - see handout for further details   Take breaks from activities before you tire   Set alarm to remember to take break             Change your home to make it safe for you    DO:                  Simplifying the way you set up daily activities and routines DO: DON'T:                        PRACTICE  Based on what we have talked about, you are willing to try:  ________see above________________________________________________________  ________________________________________________________________  If an idea does not work the first time, try it again.  We may make some changes over the next few sessions, based on how they work.     ____Beth Sherrine Maples, OT/L______________________________________________    _______________________ Occupational Therapist          Date          Post Clinical Reasoning: Client Action (Goal) One Interventions: Patient will  improve energy level.  Reviewed 4 P's of energy conservation with client, provided handout for her review. Did Client Try?: Yes Targeted Problem Area Status: The Same Clinician View Of Client Situation:: Paula Booker contributes her declining energy level to her poor sleep.  Reviewed 4 P's of energy conservation with her, she is practicing some of these strategies, I am not certain that she will change her habits for more ideal energy conservation. Client View Of His/Her Situation:: Excited to get cpap machine this week, as she feels this will greatly improve her quality of sleep and improve her energy level. Next Visit Plan:: follow up on energy conservation, problem solve goal 2 improve sleep hygiene.  Shirlean Mylar, MHA, OT/L [phone]

## 2022-11-04 ENCOUNTER — Other Ambulatory Visit: Payer: Self-pay

## 2022-11-04 NOTE — Patient Outreach (Signed)
Aging Gracefully Program  RN Visit  11/04/2022  Paula Booker December 10, 1945 [id number]  Visit:  RN Visit Number: 2- Second Visit  RN TIME CALCULATION: Start TIme:  RN Start Time Calculation: 1147 End Time:  RN Stop Time Calculation: 1245 Total Minutes:  RN Time Calculation: 58  Readiness To Change Score:     Universal RN Interventions: Calendar Distribution: Yes Exercise Review: Yes Medications: Yes Medication Changes: No Mood: Yes Pain: Yes PCP Advocacy/Support: No Fall Prevention: Yes Incontinence: Yes Clinician View Of Client Situation: Ambulatory to the door without difficulty. Appears happy. Client View Of His/Her Situation: Patient reports that she is getting more organized. Reports worsening reflux. has cut out beef due to gout.  reports dfrinking alot of milk. No recent falls, no changes in medications.  Healthcare Provider Communication: Did Surveyor, mining With [employer] Provider?: No According to Client, Did PCP Report Communication With An Aging Gracefully RN?: Yes  Clinician View of Client Situation: Clinician View Of Client Situation: Ambulatory to the door without difficulty. Appears happy. Client's View of His/Her Situation: Client View Of His/Her Situation: Patient reports that she is getting more organized. Reports worsening reflux. has cut out beef due to gout.  reports dfrinking alot of milk. No recent falls, no changes in medications.  Medication Assessment:Denies Medication changes    OT Update: Doing well. Pending [employer] contracts  Session Summary: Appears to be doing well with no new concerns.    Goals Addressed               This Visit's Progress     AG RN (pt-stated)        Goals:  Patient reports she would like to have improved energy in the next 160 days.   24-Oct-2022 Assessment:  Reports decease energy for a while. States she does things for others and has a hard time saying no.  Reports she has belongings  that belong to others.   Interventions; reviewed need to set deadlines to get things moved out by her family.  Provided brainstorming to problem solve. Talked about making a list and trying to complete task.  Reviewed healthy diet.  Reviewed hydration. Reviewed doing task earlier in the am when outside. Provided [facility] Calendar.   Plan: 11/01/2022 next home visit.  Rowe Pavy RN, BSN, CEN RN Case Production designer, theatre/television/film for Aging Gracefully [facility] Mobile: [phone]   11/04/2022 Assessment:  reports she is having more energy reports sewing more and this is a stress relieving.   Interventions; reviewed fall preventions.   Provided [facility] exercise plan.  Reviewed each exercise and encouraged daily exercise.   Plan: Next home visit planned for 12/06/2022  Rowe Pavy RN, BSN, CEN RN Case Manager for [employer] [facility] Mobile: [phone]        Rowe Pavy RN, BSN, Careers adviser for [employer] Mobile: [phone]

## 2022-11-04 NOTE — Patient Instructions (Signed)
Visit Information  Thank you for taking time to visit with me today. Please don't hesitate to contact me if I can be of assistance to you before our next scheduled home appointment.  Following are the goals we discussed today:   Goals Addressed               This Visit's Progress     AG RN (pt-stated)        Goals:  Patient reports she would like to have improved energy in the next 160 days.   10/01/2022 Assessment:  Reports decease energy for a while. States she does things for others and has a hard time saying no.  Reports she has belongings that belong to others.   Interventions; reviewed need to set deadlines to get things moved out by her family.  Provided brainstorming to problem solve. Talked about making a list and trying to complete task.  Reviewed healthy diet.  Reviewed hydration. Reviewed doing task earlier in the am when outside. Provided [facility] Calendar.   Plan: 11/01/2022 next home visit.  Rowe Pavy RN, BSN, CEN RN Case Production designer, theatre/television/film for Aging Gracefully [facility] Mobile: [phone]   11/04/2022 Assessment:  reports she is having more energy reports sewing more and this is a stress relieving.   Interventions; reviewed fall preventions.   Provided [facility]N exercise plan.  Reviewed each exercise and encouraged daily exercise.   Plan: Next home visit planned for 12/06/2022  Rowe Pavy RN, BSN, CEN RN Case Manager for Aging Gracefully [facility] Mobile: [phone]         Our next appointment is on 12/06/2022   If you are experiencing a Mental Health or Behavioral Health Crisis or need someone to talk to, please call the Suicide and Crisis Lifeline: 988 call the [place] National Suicide Prevention Lifeline: 719-094-7510 or TTY: 279-419-2390 TTY 808-167-8944) to talk to a trained counselor call 1-800-273-TALK (toll free, 24 hour hotline) go to [facility] [address] [place]  [phone]) call 911   The patient verbalized understanding of instructions, educational materials, and care plan provided today and agreed to receive a mailed copy of patient instructions, educational materials, and care plan.   Rowe Pavy RN, BSN, Careers adviser for [employer] Mobile: [phone]

## 2022-11-14 ENCOUNTER — Ambulatory Visit: Payer: Medicare HMO | Attending: Internal Medicine

## 2022-11-14 DIAGNOSIS — I5022 Chronic systolic (congestive) heart failure: Secondary | ICD-10-CM | POA: Diagnosis not present

## 2022-11-14 DIAGNOSIS — Z95810 Presence of automatic (implantable) cardiac defibrillator: Secondary | ICD-10-CM | POA: Diagnosis not present

## 2022-11-16 ENCOUNTER — Telehealth: Payer: Self-pay

## 2022-11-16 ENCOUNTER — Ambulatory Visit (INDEPENDENT_AMBULATORY_CARE_PROVIDER_SITE_OTHER): Payer: Medicare HMO

## 2022-11-16 DIAGNOSIS — I428 Other cardiomyopathies: Secondary | ICD-10-CM | POA: Diagnosis not present

## 2022-11-16 NOTE — Progress Notes (Signed)
EPIC Encounter for ICM Monitoring  Patient Name: Paula Booker is a 77 y.o. female Date: 11/16/2022 Primary Care Physican: Henrine Screws, MD Primary Cardiologist: Eldridge Dace Electrophysiologist: Ladona Ridgel BiV Pacing: 90.4% 02/08/2022 Weight: 183 lbs 04/21/2022 Weight: 180 lbs 06/29/2022 Weight: 181 lbs 08/01/2022 Office Weight: 191 lbs 09/07/2022 Weight: 191 lbs   Time in AT/AF  0.0 hr/day (0.0%)        Attempted call to patient and unable to reach.  Left detailed message per DPR regarding transmission. Transmission reviewed.    Optivol thoracic impedance suggesting normal fluid levels within the last month.   Prescribed:  Furosemide 40 mg take 1-2 tablets (40 mg - 80 mg total) by mouth as directed once daily for edema, SOB or weight gain.   Pt taking differently per 06/29/22: She is taking 1 tablet daily with alternating 2 tablets every other day. Spironolactone 25 mg take 1 tablet by mouth daily   Labs: 05/20/2022 Creatinine 0.90, BUN 19, Potassium 4.1, Sodium 135, GFR >60  05/19/2022 Creatinine 0.94, BUN 19, Potassium 3.7, Sodium 139, GFR >60  A complete set of results can be found in Results Review.   Recommendations:  Left voice mail with ICM number and encouraged to call if experiencing any fluid symptoms.   Follow-up plan: ICM clinic phone appointment on 12/19/2022.   91 day device clinic remote transmission 02/15/2023.     EP/Cardiology Office Visits:   Recall 08/18/2023 with Dr Ladona Ridgel.    Recall 07/27/2023 with Dr Eldridge Dace.     Copy of ICM check sent to Dr. Ladona Ridgel.     3 month ICM trend: 11/14/2022.    12-14 Month ICM trend:     Karie Soda, RN 11/16/2022 3:30 PM

## 2022-11-16 NOTE — Telephone Encounter (Signed)
Remote ICM transmission received.  Attempted call to patient regarding ICM remote transmission and left detailed message per DPR.  Left ICM phone number and advised to return call for any fluid symptoms or questions. Next ICM remote transmission scheduled 12/19/2022.

## 2022-11-28 ENCOUNTER — Encounter: Payer: Self-pay | Admitting: Specialist

## 2022-11-29 ENCOUNTER — Other Ambulatory Visit: Payer: Self-pay | Admitting: Specialist

## 2022-11-29 DIAGNOSIS — Z9989 Dependence on other enabling machines and devices: Secondary | ICD-10-CM | POA: Diagnosis not present

## 2022-11-29 DIAGNOSIS — G4733 Obstructive sleep apnea (adult) (pediatric): Secondary | ICD-10-CM | POA: Diagnosis not present

## 2022-11-29 DIAGNOSIS — I10 Essential (primary) hypertension: Secondary | ICD-10-CM | POA: Diagnosis not present

## 2022-11-29 DIAGNOSIS — I428 Other cardiomyopathies: Secondary | ICD-10-CM | POA: Diagnosis not present

## 2022-11-29 DIAGNOSIS — R635 Abnormal weight gain: Secondary | ICD-10-CM | POA: Diagnosis not present

## 2022-12-01 NOTE — Progress Notes (Signed)
Remote ICD transmission.   

## 2022-12-01 NOTE — Patient Outreach (Signed)
Aging Gracefully Program  OT Follow-Up Visit  11/29/2022  HAZELEE WELLMAN [birth year] [id number]  Visit:  3- Third Visit  Start Time:  1530 End Time:  1700 Total Minutes:  90  Readiness to Change Score :  Readiness to Change Score: 10   Patient Education: Education Provided: Yes Education Details: education on sleep Nurse, learning disability) Educated: Patient Comprehension: Verbalized Understanding  Goals:   Goals Addressed             This Visit's Progress    Patient Stated   On track    Improve quality of sleep  Name _____________________________              Date__________________  OT Action Plan: Generic  Target Problem Area:  Decreased quality of sleep   Why Problem May Occur:     Waking up frequently  Can't fall asleep          Target Goal(s):  Improve quality and quantity of sleep    STRATEGIES   tactics DO:  Patient was dx with sleep apnea and is now using a cpap machine to sleep with 7-9 hours of sleep per night.  Get plenty of exercise - patient increased from 5k to 8k steps per day in the last month.   Patient is participating in hobbies extensively which is keeping her mind active.   Consider environmental factors that can affect sleep   Consider dietary factors that can affect sleep.         Change your home to make it safe for you    DO:                  Simplifying the way you set up daily activities and routines DO: DON'T:                        PRACTICE  Based on what we have talked about, you are willing to try:  ___________keep doing what you are - you have made great improvements!  ________________________________________________________________  If an idea does not work the first time, try it again.  We may make some changes over the next few sessions, based on how they work.    Leia Alf, [place],  OT/L          11/29/22 __________________________________________________    _______________________ Occupational Therapist          Date          Post Clinical Reasoning: Client Action (Goal) Two Interventions: Patient will improve quality and length of sleep - patient has begun use of a CPAP machine and is now sleeping 7-9 hours per night.  She is walking 8000 steps a day.  We discussed other environmental and personal factors that can affect sleep for future reference. Did Client Try?: Yes Targeted Problem Area Status: A Lot Better Clinician View Of Client Situation:: Ms. Lehl has made significant improvements with her sleep, exercise, and diet.  I am thoroughly impressed by her dedication! Client View Of His/Her Situation:: Thrilled with her sleep levels, exercise levels.  She is concerned that despite weight watchers she has gained weight and has an appt with her MD this week to discuss. Next Visit Plan:: follow up on sleep, problem slove goal 3 and 4.  review tips for aging at home booklet. Shirlean Mylar, MHA, OT/L [phone]

## 2022-12-01 NOTE — Patient Outreach (Signed)
Aging Gracefully Program  OT Follow-Up Visit  12/01/2022  Paula Booker Mar 11, 1946 [id number]  Visit:  3- Third Visit  Start Time:  1530 End Time:  1700 Total Minutes:  90  Readiness to Change Score :  Readiness to Change Score: 10  Home Environment Assessment:    Durable Medical Equipment:    Patient Education: Education Provided: Yes Education Details: education on sleep hygiene Person(s) Educated: Patient Comprehension: Verbalized Understanding  Goals:   Goals Addressed             This Visit's Progress    Patient Stated   On track    Improve quality of sleep  Name _____________________________              Date__________________  OT Action Plan: Generic  Target Problem Area:  Decreased quality of sleep   Why Problem May Occur:     Waking up frequently  Can't fall asleep          Target Goal(s):  Improve quality and quantity of sleep    STRATEGIES   tactics DO:  Patient was dx with sleep apnea and is now using a cpap machine to sleep with 7-9 hours of sleep per night.  Get plenty of exercise - patient increased from 5k to 8k steps per day in the last month.   Patient is participating in hobbies extensively which is keeping her mind active.   Consider environmental factors that can affect sleep   Consider dietary factors that can affect sleep.         Change your home to make it safe for you    DO:                  Simplifying the way you set up daily activities and routines DO: DON'T:                        PRACTICE  Based on what we have talked about, you are willing to try:  ___________keep doing what you are - you have made great improvements!  ________________________________________________________________  If an idea does not work the first time, try it again.  We may make some changes over the next few sessions, based on how they work.    Paula Booker, [place],  OT/L          11/29/22 __________________________________________________    _______________________ Occupational Therapist          Date          Post Clinical Reasoning: Client Action (Goal) Two Interventions: Patient will improve quality and length of sleep - patient has begun use of a CPAP machine and is now sleeping 7-9 hours per night.  She is walking 8000 steps a day.  We discussed other environmental and personal factors that can affect sleep for future reference. Did Client Try?: Yes Targeted Problem Area Status: A Lot Better

## 2022-12-03 ENCOUNTER — Other Ambulatory Visit: Payer: Self-pay

## 2022-12-03 ENCOUNTER — Emergency Department (HOSPITAL_COMMUNITY): Payer: Medicare HMO

## 2022-12-03 ENCOUNTER — Emergency Department (HOSPITAL_COMMUNITY)
Admission: EM | Admit: 2022-12-03 | Discharge: 2022-12-03 | Disposition: A | Discharge status: Home / Self Care | Payer: Medicare HMO | Source: Home / Self Care | Attending: Emergency Medicine | Admitting: Emergency Medicine

## 2022-12-03 DIAGNOSIS — R29810 Facial weakness: Secondary | ICD-10-CM | POA: Diagnosis not present

## 2022-12-03 DIAGNOSIS — R531 Weakness: Secondary | ICD-10-CM | POA: Diagnosis not present

## 2022-12-03 DIAGNOSIS — Z950 Presence of cardiac pacemaker: Secondary | ICD-10-CM | POA: Diagnosis not present

## 2022-12-03 DIAGNOSIS — J45909 Unspecified asthma, uncomplicated: Secondary | ICD-10-CM | POA: Diagnosis not present

## 2022-12-03 DIAGNOSIS — Z79899 Other long term (current) drug therapy: Secondary | ICD-10-CM | POA: Insufficient documentation

## 2022-12-03 DIAGNOSIS — I10 Essential (primary) hypertension: Secondary | ICD-10-CM | POA: Diagnosis not present

## 2022-12-03 DIAGNOSIS — Z7901 Long term (current) use of anticoagulants: Secondary | ICD-10-CM | POA: Diagnosis not present

## 2022-12-03 DIAGNOSIS — I428 Other cardiomyopathies: Secondary | ICD-10-CM | POA: Insufficient documentation

## 2022-12-03 DIAGNOSIS — R519 Headache, unspecified: Secondary | ICD-10-CM | POA: Insufficient documentation

## 2022-12-03 DIAGNOSIS — G43109 Migraine with aura, not intractable, without status migrainosus: Secondary | ICD-10-CM | POA: Diagnosis not present

## 2022-12-03 DIAGNOSIS — I213 ST elevation (STEMI) myocardial infarction of unspecified site: Secondary | ICD-10-CM | POA: Diagnosis not present

## 2022-12-03 DIAGNOSIS — H538 Other visual disturbances: Secondary | ICD-10-CM | POA: Diagnosis not present

## 2022-12-03 DIAGNOSIS — R29818 Other symptoms and signs involving the nervous system: Secondary | ICD-10-CM | POA: Diagnosis not present

## 2022-12-03 DIAGNOSIS — G4489 Other headache syndrome: Secondary | ICD-10-CM | POA: Diagnosis not present

## 2022-12-03 DIAGNOSIS — R42 Dizziness and giddiness: Secondary | ICD-10-CM | POA: Diagnosis not present

## 2022-12-03 LAB — CBC
HCT: 40.7 % (ref 36.0–46.0)
Hemoglobin: 13.8 g/dL (ref 12.0–15.0)
MCH: 31.3 pg (ref 26.0–34.0)
MCHC: 33.9 g/dL (ref 30.0–36.0)
MCV: 92.3 fL (ref 80.0–100.0)
Platelets: 230 10*3/uL (ref 150–400)
RBC: 4.41 MIL/uL (ref 3.87–5.11)
RDW: 12.3 % (ref 11.5–15.5)
WBC: 8.9 10*3/uL (ref 4.0–10.5)
nRBC: 0 % (ref 0.0–0.2)

## 2022-12-03 LAB — COMPREHENSIVE METABOLIC PANEL
ALT: 15 U/L (ref 0–44)
AST: 30 U/L (ref 15–41)
Albumin: 3.7 g/dL (ref 3.5–5.0)
Alkaline Phosphatase: 69 U/L (ref 38–126)
Anion gap: 9 (ref 5–15)
BUN: 23 mg/dL (ref 8–23)
CO2: 24 mmol/L (ref 22–32)
Calcium: 9.7 mg/dL (ref 8.9–10.3)
Chloride: 103 mmol/L (ref 98–111)
Creatinine, Ser: 0.79 mg/dL (ref 0.44–1.00)
GFR, Estimated: >60 mL/min (ref >60)
Glucose, Bld: 104 mg/dL — ABNORMAL HIGH (ref 70–99)
Potassium: 4.2 mmol/L (ref 3.5–5.1)
Sodium: 136 mmol/L (ref 135–145)
Total Bilirubin: 1.1 mg/dL (ref 0.3–1.2)
Total Protein: 6.7 g/dL (ref 6.5–8.1)

## 2022-12-03 LAB — DIFFERENTIAL
Abs Immature Granulocytes: 0.03 10*3/uL (ref 0.00–0.07)
Basophils Absolute: 0.1 10*3/uL (ref 0.0–0.1)
Basophils Relative: 1 %
Eosinophils Absolute: 0.2 10*3/uL (ref 0.0–0.5)
Eosinophils Relative: 2 %
Immature Granulocytes: 0 %
Lymphocytes Relative: 27 %
Lymphs Abs: 2.4 10*3/uL (ref 0.7–4.0)
Monocytes Absolute: 0.7 10*3/uL (ref 0.1–1.0)
Monocytes Relative: 8 %
Neutro Abs: 5.5 10*3/uL (ref 1.7–7.7)
Neutrophils Relative %: 62 %

## 2022-12-03 LAB — I-STAT CHEM 8, ED
BUN: 30 mg/dL — ABNORMAL HIGH (ref 8–23)
Calcium, Ion: 1.15 mmol/L (ref 1.15–1.40)
Chloride: 103 mmol/L (ref 98–111)
Creatinine, Ser: 0.8 mg/dL (ref 0.44–1.00)
Glucose, Bld: 104 mg/dL — ABNORMAL HIGH (ref 70–99)
HCT: 40 % (ref 36.0–46.0)
Hemoglobin: 13.6 g/dL (ref 12.0–15.0)
Potassium: 4.3 mmol/L (ref 3.5–5.1)
Sodium: 137 mmol/L (ref 135–145)
TCO2: 26 mmol/L (ref 22–32)

## 2022-12-03 LAB — ETHANOL: Alcohol, Ethyl (B): <10 mg/dL (ref <10)

## 2022-12-03 LAB — APTT: aPTT: 29 seconds (ref 24–36)

## 2022-12-03 LAB — PROTIME-INR
INR: 1.3 — ABNORMAL HIGH (ref 0.8–1.2)
Prothrombin Time: 16 seconds — ABNORMAL HIGH (ref 11.4–15.2)

## 2022-12-03 LAB — CBG MONITORING, ED: Glucose-Capillary: 100 mg/dL — ABNORMAL HIGH (ref 70–99)

## 2022-12-03 MED ORDER — SODIUM CHLORIDE 0.9% FLUSH
3.0000 mL | Freq: Once | INTRAVENOUS | Status: AC
  Administered 2022-12-03: 3 mL via INTRAVENOUS

## 2022-12-03 MED ORDER — ACETAMINOPHEN 500 MG PO TABS
1000.0000 mg | ORAL_TABLET | Freq: Once | ORAL | Status: AC
  Administered 2022-12-03: 1000 mg via ORAL
  Filled 2022-12-03: qty 2

## 2022-12-03 NOTE — ED Provider Notes (Signed)
Paula Booker EMERGENCY DEPARTMENT AT [facility] Provider Note   CSN: [id number] Arrival date & time: 12/03/22  1234  An emergency department physician performed an initial assessment on this suspected stroke patient at 1237.  History  Chief Complaint  Patient presents with   Code Stroke    Paula Booker is a 77 y.o. female.  HPI Patient presents as a code stroke.  Patient has history of multiple medical issues including nonischemic cardiomyopathy, has a pacemaker in place, obesity, asthma, hypertension.  About 1 hour prior to ED arrival the patient felt onset of left-sided weakness, head pain.  She has had multiple TIA, states that the symptoms were similar.  However, where is her TIA episodes typically are about 10 minutes, after symptoms persisted for 30 she called EMS. No chest pain, dyspnea, syncope.    Home Medications Prior to Admission medications   Medication Sig Start Date End Date Taking? Authorizing Provider  acetaminophen (TYLENOL) 500 MG tablet Take 500 mg by mouth in the morning and at bedtime. Patient not taking: Reported on 09/30/2022    [provider]  albuterol (VENTOLIN HFA) 108 (90 Base) MCG/ACT inhaler Inhale 2 puffs into the lungs every 6 (six) hours as needed for wheezing. 06/03/22   Chilton Greathouse, MD  apixaban (ELIQUIS) 5 MG TABS tablet Take 1 tablet by mouth twice daily 09/14/22   Corky Crafts, MD  Calcium Carb-Cholecalciferol 600-20 MG-MCG TABS Take 1 tablet by mouth in the morning.    [provider]  carvedilol (COREG) 6.25 MG tablet Take 1 tablet by mouth twice daily 06/21/22   Marinus Maw, MD  cetirizine (ZYRTEC) 10 MG tablet Take 10 mg by mouth in the morning.    [provider]  Cholecalciferol (VITAMIN D3) 5000 units CAPS Take 5,000 Units by mouth in the morning.    [provider]  Collagen-Vitamin C (COLLAGEN PLUS VITAMIN C PO) Take 1 tablet by mouth in the morning.    [provider]   Cyanocobalamin (VITAMIN B-12) 5000 MCG LOZG Take 5,000 mcg by mouth in the morning.    [provider]  escitalopram (LEXAPRO) 20 MG tablet Take 20 mg by mouth in the morning. 10/23/20   [provider]  fluticasone furoate-vilanterol (BREO ELLIPTA) 200-25 MCG/ACT AEPB Inhale 1 puff into the lungs daily. 06/03/22   Mannam, Colbert Coyer, MD  furosemide (LASIX) 40 MG tablet TAKE 1 TO 2 TABLETS BY MOUTH AS DIRECTED ONCE DAILY FOR  EDEMA,  SHORTNESS  OF  BREATH  OR  WEIGHT  GAIN 09/14/22   Marinus Maw, MD  ipratropium-albuterol (DUONEB) 0.5-2.5 (3) MG/3ML SOLN Take 3 mLs by nebulization every 4 (four) hours as needed. 06/03/22   Chilton Greathouse, MD  Multiple Vitamin (MULTIVITAMIN WITH MINERALS) TABS tablet Take 1 tablet by mouth in the morning.    [provider]  pantoprazole (PROTONIX) 40 MG tablet Take 40 mg by mouth 2 (two) times daily. 08/29/17   [provider]  rosuvastatin (CRESTOR) 5 MG tablet Take 5 mg by mouth at bedtime. 11/18/20   [provider]  sacubitril-valsartan Sherryll Burger) 24-26 MG Take 1 tablet by mouth twice daily 06/15/22   Marinus Maw, MD  spironolactone (ALDACTONE) 25 MG tablet Take 1 tablet (25 mg total) by mouth daily. 08/18/22   Marinus Maw, MD      Allergies    Ivp dye [iodinated contrast media], Penicillins, Sulfonamide derivatives, Tessalon [benzonatate], Sulfa antibiotics, Rosuvastatin calcium, and Onion  Review of Systems   Review of Systems  Unable to perform ROS: Acuity of condition    Physical Exam Updated Vital Signs BP 123/70   Pulse 69   Temp 98.2 F (36.8 C)   Resp 16   SpO2 97%  Physical Exam Vitals and nursing note reviewed.  Constitutional:      General: She is not in acute distress.    Appearance: She is well-developed.  HENT:     Head: Normocephalic and atraumatic.  Eyes:     Conjunctiva/sclera: Conjunctivae normal.  Cardiovascular:     Rate and Rhythm: Normal rate and regular rhythm.   Pulmonary:     Effort: Pulmonary effort is normal. No respiratory distress.     Breath sounds: Normal breath sounds. No stridor.  Abdominal:     General: There is no distension.  Skin:    General: Skin is warm and dry.  Neurological:     Mental Status: She is alert and oriented to person, place, and time.     Cranial Nerves: No cranial nerve deficit.     Comments: Mild tremor left arm, no drift, strength grossly equal.  Face is symmetric, speech is clear  Psychiatric:        Mood and Affect: Mood normal.     ED Results / Procedures / Treatments   Labs (all labs ordered are listed, but only abnormal results are displayed) Labs Reviewed  PROTIME-INR - Abnormal; Notable for the following components:      Result Value   Prothrombin Time 16.0 (*)    INR 1.3 (*)    All other components within normal limits  COMPREHENSIVE METABOLIC PANEL - Abnormal; Notable for the following components:   Glucose, Bld 104 (*)    All other components within normal limits  I-STAT CHEM 8, ED - Abnormal; Notable for the following components:   BUN 30 (*)    Glucose, Bld 104 (*)    All other components within normal limits  CBG MONITORING, ED - Abnormal; Notable for the following components:   Glucose-Capillary 100 (*)    All other components within normal limits  APTT  CBC  DIFFERENTIAL  ETHANOL    EKG EKG Interpretation Date/Time:  Saturday December 03 2022 12:54:29 EDT Ventricular Rate:  62 PR Interval:  135 QRS Duration:  135 QT Interval:  465 QTC Calculation: 473 R Axis:   9  Text Interpretation: paced rhythym Confirmed by Gerhard Munch [id number]) on 12/03/2022 1:39:46 PM  Radiology CT HEAD CODE STROKE WO CONTRAST  Result Date: 12/03/2022 CLINICAL DATA:  Code stroke. Neuro deficit, acute, stroke suspected. EXAM: CT HEAD WITHOUT CONTRAST TECHNIQUE: Contiguous axial images were obtained from the base of the skull through the vertex without intravenous contrast. RADIATION DOSE REDUCTION:  This exam was performed according to the departmental dose-optimization program which includes automated exposure control, adjustment of the mA and/or kV according to patient size and/or use of iterative reconstruction technique. COMPARISON:  CT head without contrast 05/20/2022. FINDINGS: Brain: No acute infarct, hemorrhage, or mass lesion is present. No significant white matter lesions are present. Deep brain nuclei are within normal limits. The ventricles are of normal size. No significant extraaxial fluid collection is present. Vascular: No hyperdense vessel or unexpected calcification. Skull: Calvarium is intact. No focal lytic or blastic lesions are present. No significant extracranial soft tissue lesion is present. Sinuses/Orbits: The paranasal sinuses and mastoid air cells are clear. Bilateral lens replacements are noted. Globes and orbits are otherwise unremarkable. ASPECTS [facility]  Stroke Program Early CT Score) - Ganglionic level infarction (caudate, lentiform nuclei, internal capsule, insula, M1-M3 cortex): 7/7 - Supraganglionic infarction (M4-M6 cortex): 3/3 Total score (0-10 with 10 being normal): 10/10 IMPRESSION: 1. Negative CT of the head. 2. Aspects is 10/10. The above was relayed via text pager to Dr. Dr. Teresa Coombs On 12/03/2022 at 12:46 . Electronically Signed   By: Marin Roberts M.D.   On: 12/03/2022 12:47    Procedures Procedures    Medications Ordered in ED Medications  sodium chloride flush (NS) 0.9 % injection 3 mL (3 mLs Intravenous Given 12/03/22 1318)  acetaminophen (TYLENOL) tablet 1,000 mg (1,000 mg Oral Given 12/03/22 1347)    ED Course/ Medical Decision Making/ A&P                                 Medical Decision Making Adult female with elevated risk profile for stroke including hypertension, hyperlipidemia, heart failure, prior CVA now presents with neuro changes which have improved prior to arrival but are still minimally present.  With her history, stroke eval  conducted expeditiously with our neurology colleagues, case discussed with EMS on arrival.  Stat head CT without bleed, obvious abnormality, and per neurology with resolution of symptoms, appropriate ongoing medical management, the patient is otherwise appropriate for additional neuro eval on a nonemergent basis.  On repeat exam patient's symptoms have resolved, she is awake, alert complaining only of mild headache.  Pacemaker interrogated, functioning appropriately.  With otherwise reassuring labs, patient appropriate for outpatient follow-up.  Amount and/or Complexity of Data Reviewed Independent Historian: EMS External Data Reviewed: notes. Labs: ordered. Decision-making details documented in ED Course. Radiology: ordered and independent interpretation performed. Decision-making details documented in ED Course. ECG/medicine tests: ordered and independent interpretation performed. Decision-making details documented in ED Course. Discussion of management or test interpretation with external provider(s): Neurology, pacemaker company representative  Risk OTC drugs. Decision regarding hospitalization. Diagnosis or treatment significantly limited by social determinants of health.   2:50 PM Patient awake, alert, in no distress.  Aware of return precautions, follow-up instructions, as above.        Final Clinical Impression(s) / ED Diagnoses Final diagnoses:  Weakness    Rx / DC Orders ED Discharge Orders     None         Gerhard Munch, MD 12/03/22 1450

## 2022-12-03 NOTE — ED Notes (Signed)
Paula Booker with [employer]  called to report pacemaker interrogation:  No arrhythmias or high rate episodes detected since 11/15/22. Available battery within expected range.  Possible intrathoracic fluid accumulation based off optivol  Overall, device appears to be functioning fully as programmed.  Same states they will fax over full results.  Call back number: [phone]

## 2022-12-03 NOTE — Code Documentation (Signed)
Stroke Response Nurse Documentation Code Documentation  CHARLETTA MASLAK is a 77 y.o. female arriving to [facility] via [facility] after stroke activation. Patient takes Eliquis daily, last dose this AM. Patient had a left temporal headache last night 6/10 pain, went to sleep. Wears cpap each night though woke up this AM and had taken CPAP off in her sleep. Around 1130 she became dizzy and developed left sided weakness. Headache 8/10 on way to hospital.   Stroke team at the bedside on patient arrival. Labs drawn and patient cleared for CT by Dr. Jeraldine Loots. Patient to CT with team. NIH 1, see flowsheet for details. CT head complete. Patient is not a candidate for IV Thrombolytic due to Eliquis taken this AM. Patient is not a candidate for IR due to stroke not suspected. Care Plan: q2h NIH/vitals. Bedside handoff with ED RN Elexes.    Scarlette Slice K  Rapid Response RN

## 2022-12-03 NOTE — Discharge Instructions (Signed)
Please be sure to schedule follow-up with your physicians this week.  In the interim, please sure to continue taking all medication as prescribed.  Return here for concerning changes in your condition.

## 2022-12-03 NOTE — ED Notes (Signed)
[employer] Pacemaker interrogated.   

## 2022-12-03 NOTE — ED Triage Notes (Signed)
Pt [facility] from home c/o headache that started last night. Last seen normal 12/02/2022. Presents with left sided weakness, blurry vision, "stabbing headache right side." Provider told her if it did not get better in 30 minutes to come to ED for evaluation.   Hx TIA & migraines  Pacemaker placed in February   On blood thinners

## 2022-12-03 NOTE — Consult Note (Addendum)
Neurology Consultation  Reason for Consult: Code Stroke Referring Physician: Jeraldine Loots  CC: Dizziness, headache, left sided weakness  History is obtained from: patient and past medical records  HPI: Paula Booker is a 77 y.o. female with a past medical history of AICD, A-fib on Eliquis, anxiety, arthritis, Asthma, basal cell carcinoma, CHF, WPW presenting with headache and left side weakness. She reports a headache starting last night. She was able to sleep however when she woke up her CPAP mask was off.  Her headache then became worse and she began feeling dizzy.  She waited about 30 minutes and then called EMS upon their arrival they noted a left facial droop and left-sided weakness and she was brought to [facility] ED as a code stroke.  On examination she does not have any drift on the left side however she does have increased effort when elevating the left side.  No facial droop noted.  BP is 120s, glucose is 100.  She states she is very tired.  When she is hooked up to the monitor she does have an episode of bradycardia in the 30s.  ED RN to interrogate her pacemaker.  She reports taking her Eliquis this morning, she has not missed any doses of her Eliquis.  She was previously seen in February after her pacemaker generator was exchanged for strokelike symptoms.  She was unable to have an MRI at that time.  Per cardiology she needed to wait at least 6 weeks before having an MRI.  Pacemaker information is below, recommend an MRI when a rep is available or outpatient  Update: On final exam in her ED room, she does not have any acute neurodeficit.  She reports that she just feels tired and still has a mild headache.  Pacemaker information  Medtronic [id number] S  Leads  03/22/2012: Medtronic 5076 [id number] 02/52024: Medtronic 4598 [id number] V 03/22/2012: Medtronic 8295 [id number] V  LKW: 8/23 - reports headache worsening at 1130 on 8/24 TNK given?: No, Eliquis Mechanical  Thrombectomy? No, no LVO Premorbid modified Rankin scale (mRS): 0 0-Completely asymptomatic and back to baseline post-stroke   ROS: Full ROS was performed and is negative except as noted in the HPI.   Past Medical History:  Diagnosis Date   AICD (automatic cardioverter/defibrillator) present    Anemia    as a child   Anxiety    Arthritis    "hands and knees" (03/22/2012)   Asthma    Basal cell carcinoma 04/27/2011   mid forehead([facility])   Bursitis    CHF (congestive heart failure) (HCC) 09/2011   pt. denies   Depression    Dysrhythmia    WPW   GERD (gastroesophageal reflux disease)    Head injury, acute, with loss of consciousness (HCC)    History of hiatal hernia    Hypertension    ICD (implantable cardiac defibrillator) in place 03/2012   Migraines    migraines    Nausea vomiting and diarrhea 03/18/2015   NICM (nonischemic cardiomyopathy) (HCC)    Obesity (BMI 30-39.9)    negative sleep study 2014   OSA (obstructive sleep apnea) 05/27/2018   Mild with AHI 6/hr    no cpap   Pneumonia    hx   Shortness of breath    "@ any time I can get SOB" (03/22/2012)   Skin cancer 1999   Squamous cell carcinoma of skin 07/08/2020   in situ right malar cheek-[id number]   Sudden arrhythmia death syndrome    WPW (Wolff-Parkinson-White syndrome)  Family History  Problem Relation Age of Onset   Congestive Heart Failure Mother        died 43   Kidney Stones Mother    Deafness Mother    Atrial fibrillation Mother    Congestive Heart Failure Father    Deafness Father    Healthy Sister    Healthy Sister    Healthy Sister    Congestive Heart Failure Brother    Atrial fibrillation Brother    Tuberculosis Paternal Grandfather    Migraines Neg Hx    Neural tube defect Neg Hx     Social History:   reports that she has never smoked. She has never used smokeless tobacco. She reports that she does not currently use alcohol. She reports that she does not use  drugs.  Medications  Current Facility-Administered Medications:    sodium chloride flush (NS) 0.9 % injection 3 mL, 3 mL, Intravenous, Once, Gerhard Munch, MD  Current Outpatient Medications:    acetaminophen (TYLENOL) 500 MG tablet, Take 500 mg by mouth in the morning and at bedtime. (Patient not taking: Reported on 09/30/2022), Disp: , Rfl:    albuterol (VENTOLIN HFA) 108 (90 Base) MCG/ACT inhaler, Inhale 2 puffs into the lungs every 6 (six) hours as needed for wheezing., Disp: 18 g, Rfl: 11   apixaban (ELIQUIS) 5 MG TABS tablet, Take 1 tablet by mouth twice daily, Disp: 180 tablet, Rfl: 1   Calcium Carb-Cholecalciferol 600-20 MG-MCG TABS, Take 1 tablet by mouth in the morning., Disp: , Rfl:    carvedilol (COREG) 6.25 MG tablet, Take 1 tablet by mouth twice daily, Disp: 180 tablet, Rfl: 3   cetirizine (ZYRTEC) 10 MG tablet, Take 10 mg by mouth in the morning., Disp: , Rfl:    Cholecalciferol (VITAMIN D3) 5000 units CAPS, Take 5,000 Units by mouth in the morning., Disp: , Rfl:    Collagen-Vitamin C (COLLAGEN PLUS VITAMIN C PO), Take 1 tablet by mouth in the morning., Disp: , Rfl:    Cyanocobalamin (VITAMIN B-12) 5000 MCG LOZG, Take 5,000 mcg by mouth in the morning., Disp: , Rfl:    escitalopram (LEXAPRO) 20 MG tablet, Take 20 mg by mouth in the morning., Disp: , Rfl:    fluticasone furoate-vilanterol (BREO ELLIPTA) 200-25 MCG/ACT AEPB, Inhale 1 puff into the lungs daily., Disp: 60 each, Rfl: 5   furosemide (LASIX) 40 MG tablet, TAKE 1 TO 2 TABLETS BY MOUTH AS DIRECTED ONCE DAILY FOR  EDEMA,  SHORTNESS  OF  BREATH  OR  WEIGHT  GAIN, Disp: 180 tablet, Rfl: 0   ipratropium-albuterol (DUONEB) 0.5-2.5 (3) MG/3ML SOLN, Take 3 mLs by nebulization every 4 (four) hours as needed., Disp: 360 mL, Rfl: 11   Multiple Vitamin (MULTIVITAMIN WITH MINERALS) TABS tablet, Take 1 tablet by mouth in the morning., Disp: , Rfl:    pantoprazole (PROTONIX) 40 MG tablet, Take 40 mg by mouth 2 (two) times daily.,  Disp: , Rfl: 0   rosuvastatin (CRESTOR) 5 MG tablet, Take 5 mg by mouth at bedtime., Disp: , Rfl:    sacubitril-valsartan (ENTRESTO) 24-26 MG, Take 1 tablet by mouth twice daily, Disp: 180 tablet, Rfl: 2   spironolactone (ALDACTONE) 25 MG tablet, Take 1 tablet (25 mg total) by mouth daily., Disp: 90 tablet, Rfl: 3   Exam: Current vital signs: There were no vitals taken for this visit. Vital signs in last 24 hours:    GENERAL: Awake, alert in NAD HEENT: - Normocephalic and atraumatic, dry mm, no LN++,  no Thyromegally LUNGS - Clear to auscultation bilaterally with no wheezes CV - S1S2 RRR, no m/r/g, equal pulses bilaterally. ABDOMEN - Soft, nontender, nondistended with normoactive BS Ext: warm, well perfused, intact peripheral pulses, no edema  NEURO:  Mental Status: AA&Ox3  Language: speech is clear.  Naming, repetition, fluency, and comprehension intact. Cranial Nerves: PERRL 29mm/brisk. EOMI, visual fields full, no facial asymmetry at rest or with speaking, left facial droop is distractable, facial sensation diminished on the left, splits midline on the forehead, hearing intact, tongue/uvula/soft palate midline, normal sternocleidomastoid and trapezius muscle strength. No evidence of tongue atrophy or fasciculations Motor: 5/5 strength in all extremities. Increased effort to elevate left side but no drift, hand grasp equal Tone: is normal and bulk is normal Sensation- Subjective diminished sensation on the left Coordination: FTN intact bilaterally, no ataxia in BLE. Gait- deferred  NIHSS 1a Level of Conscious.: 0 1b LOC Questions: 0 1c LOC Commands: 0 2 Best Gaze: 0 3 Visual: 0 4 Facial Palsy: 0 5a Motor Arm - left: 0 5b Motor Arm - Right: 0 6a Motor Leg - Left: 0 6b Motor Leg - Right: 0 7 Limb Ataxia:  8 Sensory: 1 9 Best Language: 0 10 Dysarthria: 0 11 Extinct. and Inatten.: 0 TOTAL: 1   Labs I have reviewed labs in epic and the results pertinent to this  consultation are:   CBC    Component Value Date/Time   WBC 8.9 12/03/2022 1235   RBC 4.41 12/03/2022 1235   HGB 13.6 12/03/2022 1243   HGB 14.6 04/25/2022 1411   HCT 40.0 12/03/2022 1243   HCT 43.2 04/25/2022 1411   PLT 230 12/03/2022 1235   PLT 213 04/25/2022 1411   MCV 92.3 12/03/2022 1235   MCV 94 04/25/2022 1411   MCH 31.3 12/03/2022 1235   MCHC 33.9 12/03/2022 1235   RDW 12.3 12/03/2022 1235   RDW 12.3 04/25/2022 1411   LYMPHSABS 2.4 12/03/2022 1235   MONOABS 0.7 12/03/2022 1235   EOSABS 0.2 12/03/2022 1235   BASOSABS 0.1 12/03/2022 1235    CMP     Component Value Date/Time   NA 137 12/03/2022 1243   NA 142 04/25/2022 1411   K 4.3 12/03/2022 1243   CL 103 12/03/2022 1243   CO2 24 05/20/2022 0431   GLUCOSE 104 (H) 12/03/2022 1243   BUN 30 (H) 12/03/2022 1243   BUN 22 04/25/2022 1411   CREATININE 0.80 12/03/2022 1243   CALCIUM 9.4 05/20/2022 0431   PROT 6.7 05/19/2022 1440   PROT 6.8 04/26/2019 0845   ALBUMIN 3.7 05/19/2022 1440   ALBUMIN 4.2 04/26/2019 0845   AST 23 05/19/2022 1440   ALT 13 05/19/2022 1440   ALKPHOS 70 05/19/2022 1440   BILITOT 0.5 05/19/2022 1440   BILITOT 0.3 04/26/2019 0845   GFRNONAA >60 05/20/2022 0431   GFRAA >60 12/31/2019 1748    Lipid Panel     Component Value Date/Time   CHOL 163 05/20/2022 0431   CHOL 186 04/26/2019 0845   TRIG 91 05/20/2022 0431   HDL 54 05/20/2022 0431   HDL 43 04/26/2019 0845   CHOLHDL 3.0 05/20/2022 0431   VLDL 18 05/20/2022 0431   LDLCALC 91 05/20/2022 0431   LDLCALC 121 (H) 04/26/2019 0845     Imaging I have reviewed the images obtained:  CT-head-no acute abnormality  Assessment:   77 y.o. female with a past medical history of AICD, A-fib on Eliquis, anxiety, arthritis, Asthma, basal cell carcinoma, CHF, WPW presenting  with headache and left side weakness. She reports a headache starting last night. She was able to sleep however when she woke up her CPAP mask was off.  Her headache then  became worse and she began feeling dizzy.  She waited about 30 minutes and then called EMS upon their arrival they noted a left facial droop and left-sided weakness and she was brought to [facility] ED as a code stroke.  On examination she does not have any drift on the left side however she does have increased effort when elevating the left side, facial droop is distractible.  BP is 120s, glucose is 100.  She states she is very tired.  Throughout exam her symptoms did improve and she currently has no acute neurodeficit, but does report fatigue and a mild headache.  Impression: Complex migraine versus cardiac event  Recommendations: - MRI w/o contrast when a rep is available. May be done outpatient - Follow-up with PCP - Pacemaker interrogation per ED - Treat headache with migraine cocktail    Patient seen and examined by NP/APP with MD. MD to update note as needed.   Elmer Picker, DNP, FNP-BC [facility] Pager: [phone]   I, the attending  neurologist, have personally obtained a history, examined the patient, evaluated laboratory data, individually viewed imaging studies and agree with radiology interpretations. I obtained additional history from pt's at bedside. I also discussed with ED physician regarding her care plan. Together with the NP/PA, we formulated the assessment and plan of care which reflects our mutual decision.  I have made any additions or clarifications directly to the above note and agree with the findings and plan as currently documented. I spent a total of 55 minutes in the care of this patient.    1 yof with vascular risk factors including A-fib on Eliquis, CHF, WPW with AICD in place, sleep apnea on CPAP who is presenting with headaches and left side weakness. Her initial FS was 100, BP 123/70, initial NIHSS 1 for sensory deficit. Her initial NCHCT negative and no cortical signs.  Her main complaints is her left side headaches, tiredness and  lightheadedness. Initial EKG bradycardic on the 30. At present my suspicion for stroke is low as her deficits resolved after the CT head. Her previous CT angiogram in February did not show any high grade stenosis. Plan for now is treatment of her headaches and management of her bradycardia per primary team. Can consider outpatient MRI brain as a routine base.    Windell Norfolk, MD  Stroke Neurology 12/03/2022 2:07 PM

## 2022-12-04 DIAGNOSIS — R531 Weakness: Secondary | ICD-10-CM | POA: Diagnosis not present

## 2022-12-05 ENCOUNTER — Other Ambulatory Visit: Payer: Self-pay | Admitting: Pulmonary Disease

## 2022-12-06 ENCOUNTER — Other Ambulatory Visit: Payer: Self-pay

## 2022-12-06 NOTE — Patient Outreach (Signed)
Aging Gracefully Program  RN Visit  12/06/2022  Paula Booker 20-Sep-1945 [id number]  Visit:  RN Visit Number: 3- Third Visit  RN TIME CALCULATION: Start TIme:  RN Start Time Calculation: 1105 End Time:  RN Stop Time Calculation: 1200 Total Minutes:     Readiness To Change Score:     Universal RN Interventions: Calendar Distribution: Yes Exercise Review: Yes Medications: Yes Medication Changes: No Mood: Yes Pain: Yes PCP Advocacy/Support: No Fall Prevention: Yes Incontinence: Yes Clinician View Of Client Situation: Arrived to find patient sitting in her receliner.  reports blurred vision.home is neat and clean. Client View Of His/Her Situation: Paitent reports that she had an episode 4 days ago with headache, numbness on the left side of her body.  EMS took to the hospital as a code stroke.  Reports CT was negative. No MRI.  Now has blurred vision.  Reports BP is 117/60.   Reports her balance is still off.  Reports she joined weight watchers and gained 5 pounds. reports she is following a weight watches diet. Patient reports that she thinks that she is trying to avoid stress.  Healthcare Provider Communication: Did Surveyor, mining With [employer] Provider?: No According to Client, Did PCP Report Communication With An Aging Gracefully RN?: No  Clinician View of Client Situation: Clinician View Of Client Situation: Arrived to find patient sitting in her receliner.  reports blurred vision.home is neat and clean. Client's View of His/Her Situation: Client View Of His/Her Situation: Paitent reports that she had an episode 4 days ago with headache, numbness on the left side of her body.  EMS took to the hospital as a code stroke.  Reports CT was negative. No MRI.  Now has blurred vision.  Reports BP is 117/60.   Reports her balance is still off.  Reports she joined weight watchers and gained 5 pounds. reports she is following a weight watches diet. Patient reports that she thinks  that she is trying to avoid stress.  Medication Assessment:denies any changes to medications.     OT Update: pending home modifications  Session Summary: Patient with new event that occurred with her health. Encouraged patient report to MD   Goals Addressed               This Visit's Progress     AG RN (pt-stated)        Goals:  Patient reports she would like to have improved energy in the next 160 days.   2022/10/02 Assessment:  Reports decease energy for a while. States she does things for others and has a hard time saying no.  Reports she has belongings that belong to others.   Interventions; reviewed need to set deadlines to get things moved out by her family.  Provided brainstorming to problem solve. Talked about making a list and trying to complete task.  Reviewed healthy diet.  Reviewed hydration. Reviewed doing task earlier in the am when outside. Provided [facility] Calendar.   Plan: 11/01/2022 next home visit.  Rowe Pavy RN, BSN, CEN RN Case Production designer, theatre/television/film for Aging Gracefully [facility] Mobile: [phone]   11/04/2022 Assessment:  reports she is having more energy reports sewing more and this is a stress relieving.   Interventions; reviewed fall preventions.   Provided THN exercise plan.  Reviewed each exercise and encouraged daily exercise.   Plan: Next home visit planned for 12/06/2022  Rowe Pavy RN, BSN, CEN RN Case Manager for Aging [employer] Mobile: [phone]  12/06/2022 Assessment:   Patient currently reporting poor energy levels after " event" on  8/24?2024.  Reports she thought she was having a stroke. It was ruled out in the ED.  Reports that she continues to have blurred vision and lack of energy.  Reports being weak.  Reports prior to this event she was feeling well.  Patient is awake and alert. Strong and equal grips. Good pedal pushes. Smile is symmetrical. Speech is normal and no facial drooping.    Interventions: reviewed stoke symptoms and when to call 911.  Encouraged patient to call PCP and eye doctor today for follow up since she continues to have symptoms. She agreed.  Encouraged patient to continue to take her medications as prescribed. Reviewed importance of good nutrition and rest.  Encouraged patient to continue to do her home exercise as suggested.  Plan: next home visit planned for 01/03/2023 Email sent to [employer] to inquire about where patient is for getting her home modifications.  Patient is concerns about a salamander getting in the home.   Rowe Pavy RN, BSN, Careers adviser for [employer] Mobile: [phone]          Rowe Pavy RN, BSN, Careers adviser for [employer] Mobile: [phone]

## 2022-12-06 NOTE — Patient Instructions (Signed)
Visit Information  Thank you for taking time to visit with me today. Please don't hesitate to contact me if I can be of assistance to you before our next scheduled home appointment.  Following are the goals we discussed today:   Goals Addressed               This Visit's Progress     AG RN (pt-stated)        Goals:  Patient reports she would like to have improved energy in the next 160 days.   2022-10-24 Assessment:  Reports decease energy for a while. States she does things for others and has a hard time saying no.  Reports she has belongings that belong to others.   Interventions; reviewed need to set deadlines to get things moved out by her family.  Provided brainstorming to problem solve. Talked about making a list and trying to complete task.  Reviewed healthy diet.  Reviewed hydration. Reviewed doing task earlier in the am when outside. Provided [facility] Calendar.   Plan: 11/01/2022 next home visit.  Rowe Pavy RN, BSN, CEN RN Case Production designer, theatre/television/film for Aging Gracefully [facility] Mobile: [phone]   11/04/2022 Assessment:  reports she is having more energy reports sewing more and this is a stress relieving.   Interventions; reviewed fall preventions.   Provided THN exercise plan.  Reviewed each exercise and encouraged daily exercise.   Plan: Next home visit planned for 12/06/2022  Rowe Pavy RN, BSN, CEN RN Case Manager for Aging Gracefully [facility] Mobile: [phone]  12/06/2022 Assessment:   Patient currently reporting poor energy levels after " event" on  8/24?2024.  Reports she thought she was having a stroke. It was ruled out in the ED.  Reports that she continues to have blurred vision and lack of energy.  Reports being weak.  Reports prior to this event she was feeling well.  Patient is awake and alert. Strong and equal grips. Good pedal pushes. Smile is symmetrical. Speech is normal and no facial drooping.   Interventions: reviewed  stoke symptoms and when to call 911.  Encouraged patient to call PCP and eye doctor today for follow up since she continues to have symptoms. She agreed.  Encouraged patient to continue to take her medications as prescribed. Reviewed importance of good nutrition and rest.  Encouraged patient to continue to do her home exercise as suggested.  Plan: next home visit planned for 01/03/2023 Email sent to [employer] to inquire about where patient is for getting her home modifications.  Patient is concerns about a salamander getting in the home.   Rowe Pavy RN, BSN, CEN RN Case Production designer, theatre/television/film for Aging Gracefully [facility] Mobile: [phone]            Our next appointment is on 01/03/2023  If you are experiencing a Mental Health or Behavioral Health Crisis or need someone to talk to, please call the Suicide and Crisis Lifeline: 988 call the [place] National Suicide Prevention Lifeline: 367 468 0300 or TTY: 415-343-2990 TTY 504-858-0675) to talk to a trained counselor call 1-800-273-TALK (toll free, 24 hour hotline) go to [facility] [address], [place] [phone]) call 911   The patient verbalized understanding of instructions, educational materials, and care plan provided today and agreed to receive a mailed copy of patient instructions, educational materials, and care plan.   Rowe Pavy RN, BSN, Careers adviser for [employer] Mobile: [phone]

## 2022-12-08 DIAGNOSIS — H04123 Dry eye syndrome of bilateral lacrimal glands: Secondary | ICD-10-CM | POA: Diagnosis not present

## 2022-12-12 ENCOUNTER — Other Ambulatory Visit: Payer: Self-pay | Admitting: Internal Medicine

## 2022-12-19 ENCOUNTER — Ambulatory Visit: Payer: Medicare HMO | Attending: Internal Medicine

## 2022-12-19 DIAGNOSIS — I5022 Chronic systolic (congestive) heart failure: Secondary | ICD-10-CM | POA: Diagnosis not present

## 2022-12-19 DIAGNOSIS — Z95810 Presence of automatic (implantable) cardiac defibrillator: Secondary | ICD-10-CM

## 2022-12-20 DIAGNOSIS — K7460 Unspecified cirrhosis of liver: Secondary | ICD-10-CM | POA: Diagnosis not present

## 2022-12-20 DIAGNOSIS — Z23 Encounter for immunization: Secondary | ICD-10-CM | POA: Diagnosis not present

## 2022-12-20 DIAGNOSIS — I428 Other cardiomyopathies: Secondary | ICD-10-CM | POA: Diagnosis not present

## 2022-12-20 DIAGNOSIS — E782 Mixed hyperlipidemia: Secondary | ICD-10-CM | POA: Diagnosis not present

## 2022-12-20 DIAGNOSIS — E7439 Other disorders of intestinal carbohydrate absorption: Secondary | ICD-10-CM | POA: Diagnosis not present

## 2022-12-20 DIAGNOSIS — K219 Gastro-esophageal reflux disease without esophagitis: Secondary | ICD-10-CM | POA: Diagnosis not present

## 2022-12-20 DIAGNOSIS — Z79899 Other long term (current) drug therapy: Secondary | ICD-10-CM | POA: Diagnosis not present

## 2022-12-20 DIAGNOSIS — I10 Essential (primary) hypertension: Secondary | ICD-10-CM | POA: Diagnosis not present

## 2022-12-20 DIAGNOSIS — I509 Heart failure, unspecified: Secondary | ICD-10-CM | POA: Diagnosis not present

## 2022-12-21 ENCOUNTER — Telehealth: Payer: Self-pay

## 2022-12-21 NOTE — Progress Notes (Signed)
EPIC Encounter for ICM Monitoring  Patient Name: Paula Booker is a 77 y.o. female Date: 12/21/2022 Primary Care Physican: Henrine Screws, MD Primary Cardiologist: Eldridge Dace Electrophysiologist: Ladona Ridgel BiV Pacing: 92.7% 02/08/2022 Weight: 183 lbs 04/21/2022 Weight: 180 lbs 06/29/2022 Weight: 181 lbs 08/01/2022 Office Weight: 191 lbs 09/07/2022 Weight: 191 lbs   Time in AT/AF  0.0 hr/day (0.0%)          Spoke with patient and heart failure questions reviewed.  Transmission results reviewed.  Pt asymptomatic for fluid accumulation.  Reports feeling well at this time and voices no complaints.  She ws drinking a lot of fluids during decreased impedance.    Optivol thoracic impedance suggesting possible fluid accumulation from 8/14-9/6.     Prescribed:  Furosemide 40 mg take 1-2 tablets (40 mg - 80 mg total) by mouth as directed once daily for edema, SOB or weight gain.   Pt reports on 9/11 taking 1 tablet daily and takes 2nd one if needed for fluid symptoms. Spironolactone 25 mg take 1 tablet by mouth daily   Labs: 12/03/2022 Creatinine 0.80, BUN 30, Potassium 4.3, Sodium 137 05/20/2022 Creatinine 0.90, BUN 19, Potassium 4.1, Sodium 135, GFR >60  05/19/2022 Creatinine 0.94, BUN 19, Potassium 3.7, Sodium 139, GFR >60  A complete set of results can be found in Results Review.   Recommendations:  No changes and encouraged to call if experiencing any fluid symptoms.    Follow-up plan: ICM clinic phone appointment on 01/23/2023.   91 day device clinic remote transmission 02/15/2023.     EP/Cardiology Office Visits:   Recall 08/18/2023 with Dr Ladona Ridgel.    Recall 07/27/2023 with Dr Eldridge Dace.     Copy of ICM check sent to Dr. Ladona Ridgel.    3 month ICM trend: 12/19/2022.    12-14 Month ICM trend:     Karie Soda, RN 12/21/2022 2:32 PM

## 2022-12-21 NOTE — Telephone Encounter (Signed)
Remote ICM transmission received.  Attempted call to patient regarding ICM remote transmission and left detailed message per DPR.  Left ICM phone number and advised to return call for any fluid symptoms or questions. Next ICM remote transmission scheduled 01/23/2023.

## 2023-01-03 ENCOUNTER — Other Ambulatory Visit: Payer: Self-pay

## 2023-01-03 NOTE — Patient Instructions (Signed)
Visit Information  Thank you for taking time to visit with me today. Please don't hesitate to contact me if I can be of assistance to you before our next scheduled home appointment.  Following are the goals we discussed today:   Goals Addressed               This Visit's Progress     COMPLETED: AG RN (pt-stated)        Goals:  Patient reports she would like to have improved energy in the next 160 days.   10-22-22 Assessment:  Reports decease energy for a while. States she does things for others and has a hard time saying no.  Reports she has belongings that belong to others.   Interventions; reviewed need to set deadlines to get things moved out by her family.  Provided brainstorming to problem solve. Talked about making a list and trying to complete task.  Reviewed healthy diet.  Reviewed hydration. Reviewed doing task earlier in the am when outside. Provided [facility] Calendar.   Plan: 11/01/2022 next home visit.  Rowe Pavy RN, BSN, CEN RN Case Production designer, theatre/television/film for Aging [facility] Mobile: [phone]   11/04/2022 Assessment:  reports she is having more energy reports sewing more and this is a stress relieving.   Interventions; reviewed fall preventions.   Provided THN exercise plan.  Reviewed each exercise and encouraged daily exercise.   Plan: Next home visit planned for 12/06/2022  Rowe Pavy RN, BSN, CEN RN Case Manager for Aging [facility] Mobile: [phone]  12/06/2022 Assessment:   Patient currently reporting poor energy levels after " event" on  8/24?2024.  Reports she thought she was having a stroke. It was ruled out in the ED.  Reports that she continues to have blurred vision and lack of energy.  Reports being weak.  Reports prior to this event she was feeling well.  Patient is awake and alert. Strong and equal grips. Good pedal pushes. Smile is symmetrical. Speech is normal and no facial drooping.   Interventions:  reviewed stoke symptoms and when to call 911.  Encouraged patient to call PCP and eye doctor today for follow up since she continues to have symptoms. She agreed.  Encouraged patient to continue to take her medications as prescribed. Reviewed importance of good nutrition and rest.  Encouraged patient to continue to do her home exercise as suggested.  Plan: next home visit planned for 01/03/2023 Email sent to [employer] to inquire about where patient is for getting her home modifications.  Patient is concerns about a salamander getting in the home.   Rowe Pavy RN, BSN, CEN RN Case Production designer, theatre/television/film for [employer] [facility] Mobile: [phone]   01/03/2023 Assessment: Reports increase in energy. Reports that she has been cleaning out kitchen.  Reports she has been preparing for [employer] to come and start repairs.  Interventions:  Encouraged patient to continue to do home exercises and be active.  Reviewed when to call MD.   Plan: Goals met.  Lonia Chimera RN, BSN, CEN RN Case Production designer, theatre/television/film for Aging Chemical engineer Phone:  [phone]          If you are experiencing a Mental Health or Behavioral Health Crisis or need someone to talk to, please call the Suicide and Crisis Lifeline: 988 call the [place] National Suicide Prevention Lifeline: 516-398-8256 or TTY: 916-046-5690 TTY 2073287753) to talk to a trained counselor call 1-800-273-TALK (toll free, 24 hour  hotline) go to [facility] [address], [place] [phone]) call 911   The patient verbalized understanding of instructions, educational materials, and care plan provided today and agreed to receive a mailed copy of patient instructions, educational materials, and care plan.   Lonia Chimera RN, BSN, Careers adviser for [employer] Phone:  [phone]

## 2023-01-03 NOTE — Patient Outreach (Signed)
Aging Gracefully Program  RN Visit  01/03/2023  Paula Booker 05-Apr-1946 [id number]  Visit:  RN Visit Number: 4- Fourth Visit  RN TIME CALCULATION: Start TIme:  RN Start Time Calculation: 1142 End Time:  RN Stop Time Calculation: 1245 Total Minutes:  RN Time Calculation: 63  Readiness To Change Score:  Readiness to Change Score: 10  Universal RN Interventions: Calendar Distribution: Yes Exercise Review: Yes Medications: Yes Medication Changes: No Mood: Yes Pain: Yes PCP Advocacy/Support: No Fall Prevention: Yes Incontinence: Yes Clinician View Of Client Situation: Arrived and patient sitting in recliner.  Awake and alert. Ambulating well. Patient reports that she continues to have memory issues  after the TIA. Client View Of His/Her Situation: Patient reports that she continues to have memory issues after the TIA. Patient reports that she has been doing weight watchers.  Reports she is eating healthy.  Reports that [facility] is coming out tomorrow am to do a reassessment.  Patient is excited to get her renovations completed.  Healthcare Provider Communication: Did Surveyor, mining With [employer] Provider?: No  Clinician View of Client Situation: Clinician View Of Client Situation: Arrived and patient sitting in recliner.  Awake and alert. Ambulating well. Patient reports that she continues to have memory issues  after the TIA. Client's View of His/Her Situation: Client View Of His/Her Situation: Patient reports that she continues to have memory issues after the TIA. Patient reports that she has been doing weight watchers.  Reports she is eating healthy.  Reports that [facility] [employer] is coming out tomorrow am to do a reassessment.  Patient is excited to get her renovations completed.  Medication Assessment denies any changes to medications    OT Update: [facility] will come to see patient tomorrow about start date for home  modification  Session Summary: Patient doing really well. Showing me her quilting patterns for her upcoming quilts.  Reports good energy. Just have occasional memory concerns.    Goals Addressed               This Visit's Progress     COMPLETED: AG RN (pt-stated)        Goals:  Patient reports she would like to have improved energy in the next 160 days.   2022-10-21 Assessment:  Reports decease energy for a while. States she does things for others and has a hard time saying no.  Reports she has belongings that belong to others.   Interventions; reviewed need to set deadlines to get things moved out by her family.  Provided brainstorming to problem solve. Talked about making a list and trying to complete task.  Reviewed healthy diet.  Reviewed hydration. Reviewed doing task earlier in the am when outside. Provided [facility] Calendar.   Plan: 11/01/2022 next home visit.  Rowe Pavy RN, BSN, CEN RN Case Production designer, theatre/television/film for Aging [facility] Mobile: [phone]   11/04/2022 Assessment:  reports she is having more energy reports sewing more and this is a stress relieving.   Interventions; reviewed fall preventions.   Provided THN exercise plan.  Reviewed each exercise and encouraged daily exercise.   Plan: Next home visit planned for 12/06/2022  Rowe Pavy RN, BSN, CEN RN Case Manager for Aging [facility] Mobile: [phone]  12/06/2022 Assessment:   Patient currently reporting poor energy levels after " event" on  8/24?2024.  Reports she thought she was having a stroke. It was ruled out in the ED.  Reports that  she continues to have blurred vision and lack of energy.  Reports being weak.  Reports prior to this event she was feeling well.  Patient is awake and alert. Strong and equal grips. Good pedal pushes. Smile is symmetrical. Speech is normal and no facial drooping.   Interventions: reviewed stoke symptoms and when to call 911.   Encouraged patient to call PCP and eye doctor today for follow up since she continues to have symptoms. She agreed.  Encouraged patient to continue to take her medications as prescribed. Reviewed importance of good nutrition and rest.  Encouraged patient to continue to do her home exercise as suggested.  Plan: next home visit planned for 01/03/2023 Email sent to [employer] to inquire about where patient is for getting her home modifications.  Patient is concerns about a salamander getting in the home.   Rowe Pavy RN, BSN, CEN RN Case Production designer, theatre/television/film for [employer] [facility] Mobile: [phone]   01/03/2023 Assessment: Reports increase in energy. Reports that she has been cleaning out kitchen.  Reports she has been preparing for [employer] to come and start repairs.  Interventions:  Encouraged patient to continue to do home exercises and be active.  Reviewed when to call MD.   Plan: Goals met.  Lonia Chimera RN, BSN, Careers adviser for [employer] Phone:  [phone]        Lonia Chimera RN, BSN, Careers adviser for [employer] Phone:  [phone]

## 2023-01-09 DIAGNOSIS — H04123 Dry eye syndrome of bilateral lacrimal glands: Secondary | ICD-10-CM | POA: Diagnosis not present

## 2023-01-23 ENCOUNTER — Ambulatory Visit: Payer: Medicare HMO | Attending: Internal Medicine

## 2023-01-23 DIAGNOSIS — I5022 Chronic systolic (congestive) heart failure: Secondary | ICD-10-CM | POA: Diagnosis not present

## 2023-01-23 DIAGNOSIS — Z95810 Presence of automatic (implantable) cardiac defibrillator: Secondary | ICD-10-CM | POA: Diagnosis not present

## 2023-01-27 NOTE — Progress Notes (Signed)
EPIC Encounter for ICM Monitoring  Patient Name: Paula Booker is a 77 y.o. female Date: 01/27/2023 Primary Care Physican: Henrine Screws, MD Primary Cardiologist: Eldridge Dace Electrophysiologist: Ladona Ridgel BiV Pacing: 92.5% 02/08/2022 Weight: 183 lbs 04/21/2022 Weight: 180 lbs 06/29/2022 Weight: 181 lbs 08/01/2022 Office Weight: 191 lbs 09/07/2022 Weight: 191 lbs 01/27/2023 Weight: unknown   Time in AT/AF  0.0 hr/day (0.0%)          Spoke with patient and heart failure questions reviewed.  Transmission results reviewed.  Pt asymptomatic for fluid accumulation.  Reports feeling well at this time and voices no complaints.    Optivol thoracic impedance suggesting intermittent days with possible fluid accumulation within the last month.     Prescribed:  Furosemide 40 mg take 1-2 tablets (40 mg - 80 mg total) by mouth as directed once daily for edema, SOB or weight gain.   Pt reports on 9/11 taking 1 tablet daily and takes 2nd one if needed for fluid symptoms. Spironolactone 25 mg take 1 tablet by mouth daily   Labs: 12/03/2022 Creatinine 0.80, BUN 30, Potassium 4.3, Sodium 137 05/20/2022 Creatinine 0.90, BUN 19, Potassium 4.1, Sodium 135, GFR >60  05/19/2022 Creatinine 0.94, BUN 19, Potassium 3.7, Sodium 139, GFR >60  A complete set of results can be found in Results Review.   Recommendations:  No changes and encouraged to call if experiencing any fluid symptoms.   Follow-up plan: ICM clinic phone appointment on 02/27/2023.   91 day device clinic remote transmission 02/15/2023.     EP/Cardiology Office Visits:   Recall 08/18/2023 with Dr Ladona Ridgel.    Recall 07/27/2023 with Dr Eldridge Dace.     Copy of ICM check sent to Dr. Ladona Ridgel.   3 month ICM trend: 01/22/2023.    12-14 Month ICM trend:     Karie Soda, RN 01/27/2023 9:01 AM

## 2023-02-09 DIAGNOSIS — K219 Gastro-esophageal reflux disease without esophagitis: Secondary | ICD-10-CM | POA: Diagnosis not present

## 2023-02-09 DIAGNOSIS — G4733 Obstructive sleep apnea (adult) (pediatric): Secondary | ICD-10-CM | POA: Diagnosis not present

## 2023-02-09 DIAGNOSIS — Z8719 Personal history of other diseases of the digestive system: Secondary | ICD-10-CM | POA: Diagnosis not present

## 2023-02-09 DIAGNOSIS — K9089 Other intestinal malabsorption: Secondary | ICD-10-CM | POA: Diagnosis not present

## 2023-02-09 DIAGNOSIS — R933 Abnormal findings on diagnostic imaging of other parts of digestive tract: Secondary | ICD-10-CM | POA: Diagnosis not present

## 2023-02-09 DIAGNOSIS — Z8679 Personal history of other diseases of the circulatory system: Secondary | ICD-10-CM | POA: Diagnosis not present

## 2023-02-15 ENCOUNTER — Ambulatory Visit (INDEPENDENT_AMBULATORY_CARE_PROVIDER_SITE_OTHER): Payer: Medicare HMO

## 2023-02-15 DIAGNOSIS — I428 Other cardiomyopathies: Secondary | ICD-10-CM | POA: Diagnosis not present

## 2023-02-15 LAB — CUP PACEART REMOTE DEVICE CHECK
Battery Remaining Longevity: 126 mo
Battery Voltage: 3.01 V
Brady Statistic RV Percent Paced: 2.42 %
Date Time Interrogation Session: 20241105193529
HighPow Impedance: 78 Ohm
Implantable Lead Connection Status: 753985
Implantable Lead Connection Status: 753985
Implantable Lead Connection Status: 753985
Implantable Lead Implant Date: 20131212
Implantable Lead Implant Date: 20131212
Implantable Lead Implant Date: 20240205
Implantable Lead Location: 753858
Implantable Lead Location: 753859
Implantable Lead Location: 753860
Implantable Lead Model: 4598
Implantable Lead Model: 5076
Implantable Lead Model: 6935
Implantable Pulse Generator Implant Date: 20240205
Lead Channel Impedance Value: 1007 Ohm
Lead Channel Impedance Value: 1026 Ohm
Lead Channel Impedance Value: 361 Ohm
Lead Channel Impedance Value: 399 Ohm
Lead Channel Impedance Value: 456 Ohm
Lead Channel Impedance Value: 513 Ohm
Lead Channel Impedance Value: 646 Ohm
Lead Channel Impedance Value: 665 Ohm
Lead Channel Impedance Value: 684 Ohm
Lead Channel Impedance Value: 741 Ohm
Lead Channel Impedance Value: 912 Ohm
Lead Channel Impedance Value: 931 Ohm
Lead Channel Impedance Value: 969 Ohm
Lead Channel Pacing Threshold Amplitude: 0.5 V
Lead Channel Pacing Threshold Amplitude: 0.75 V
Lead Channel Pacing Threshold Amplitude: 1.25 V
Lead Channel Pacing Threshold Pulse Width: 0.4 ms
Lead Channel Pacing Threshold Pulse Width: 0.4 ms
Lead Channel Pacing Threshold Pulse Width: 0.4 ms
Lead Channel Sensing Intrinsic Amplitude: 3 mV
Lead Channel Sensing Intrinsic Amplitude: 6.3 mV
Lead Channel Setting Pacing Amplitude: 2 V
Lead Channel Setting Pacing Amplitude: 2 V
Lead Channel Setting Pacing Amplitude: 2.5 V
Lead Channel Setting Pacing Pulse Width: 0.4 ms
Lead Channel Setting Pacing Pulse Width: 0.4 ms
Lead Channel Setting Sensing Sensitivity: 0.3 mV
Zone Setting Status: 755011
Zone Setting Status: 755011

## 2023-02-17 DIAGNOSIS — R5383 Other fatigue: Secondary | ICD-10-CM | POA: Diagnosis not present

## 2023-02-17 DIAGNOSIS — R0989 Other specified symptoms and signs involving the circulatory and respiratory systems: Secondary | ICD-10-CM | POA: Diagnosis not present

## 2023-02-17 DIAGNOSIS — R051 Acute cough: Secondary | ICD-10-CM | POA: Diagnosis not present

## 2023-02-20 ENCOUNTER — Encounter: Payer: Self-pay | Admitting: Specialist

## 2023-02-20 ENCOUNTER — Telehealth: Payer: Self-pay | Admitting: Specialist

## 2023-02-20 NOTE — Telephone Encounter (Signed)
Mrs. Perri called to cancel her 02/20/23 appointment as she is not feeling well.  She will reach out to this clinician once she is feeling better and reschedule. Shirlean Mylar, MHA, OT/L [phone]

## 2023-02-22 DIAGNOSIS — J069 Acute upper respiratory infection, unspecified: Secondary | ICD-10-CM | POA: Diagnosis not present

## 2023-02-27 ENCOUNTER — Ambulatory Visit: Payer: Medicare HMO | Attending: Internal Medicine

## 2023-02-27 DIAGNOSIS — I5022 Chronic systolic (congestive) heart failure: Secondary | ICD-10-CM

## 2023-02-27 DIAGNOSIS — Z95810 Presence of automatic (implantable) cardiac defibrillator: Secondary | ICD-10-CM | POA: Diagnosis not present

## 2023-02-27 NOTE — Progress Notes (Signed)
EPIC Encounter for ICM Monitoring  Patient Name: Paula Booker is a 77 y.o. female Date: 02/27/2023 Primary Care Physican: Henrine Screws, MD Primary Cardiologist: Eldridge Dace Electrophysiologist: Ladona Ridgel BiV Pacing: 93.5% 02/08/2022 Weight: 183 lbs 04/21/2022 Weight: 180 lbs 06/29/2022 Weight: 181 lbs 08/01/2022 Office Weight: 191 lbs 09/07/2022 Weight: 191 lbs 01/27/2023 Weight: unknown   Time in AT/AF  <0.1 hours/day (<0.1 %)          Spoke with patient and heart failure questions reviewed.  Transmission results reviewed.  Pt has slight swelling of feet.  Pt has bad virus infection and voice is very hoarse.  She is taking cough syrup.    Optivol thoracic impedance suggesting possible fluid accumulation starting 11/3 which may be related to her infection and also drinking a lot of fluid as recommended by physician that dx her infection.     Prescribed:  Furosemide 40 mg take 1-2 tablets (40 mg - 80 mg total) by mouth as directed once daily for edema, SOB or weight gain.   Pt reports on 9/11 taking 1 tablet daily and takes 2nd one if needed for fluid symptoms. Spironolactone 25 mg take 1 tablet by mouth daily   Labs: 12/03/2022 Creatinine 0.80, BUN 30, Potassium 4.3, Sodium 137 05/20/2022 Creatinine 0.90, BUN 19, Potassium 4.1, Sodium 135, GFR >60  05/19/2022 Creatinine 0.94, BUN 19, Potassium 3.7, Sodium 139, GFR >60  A complete set of results can be found in Results Review.   Recommendations:  Advised to slightly decreased fluid intake to help resolved fluid accumulation.    Follow-up plan: ICM clinic phone appointment on 03/06/2023 to recheck fluid levels.   91 day device clinic remote transmission 05/17/2023.     EP/Cardiology Office Visits:   Recall 08/18/2023 with Dr Ladona Ridgel.    Recall 07/27/2023 with Dr Eldridge Dace.     Copy of ICM check sent to Dr. Ladona Ridgel.   3 month ICM trend: 02/27/2023.    12-14 Month ICM trend:     Karie Soda, RN 02/27/2023 9:47 AM

## 2023-03-02 ENCOUNTER — Other Ambulatory Visit: Payer: Self-pay | Admitting: Specialist

## 2023-03-02 NOTE — Patient Outreach (Signed)
Aging Gracefully Program  OT FINAL Visit  03/02/2023  Paula Booker Jan 28, 1946 [id number]  Visit:  4- Fourth Visit  Start Time:  1630 End Time:  1730 Total Minutes:  60  Readiness to Change:  Readiness to Change Score: 10   Patient Education: Education Details: Tips for Aging at Home - reviewed booklet and issued a copy to patient Person(s) Educated: Patient Comprehension: Verbalized Understanding  Goals:  Goals Addressed             This Visit's Progress    COMPLETED: Patient Stated   On track    Improve quality of sleep  Name _____________________________              Date__________________  OT Action Plan: Generic  Target Problem Area:  Decreased quality of sleep   Why Problem May Occur:     Waking up frequently  Can't fall asleep          Target Goal(s):  Improve quality and quantity of sleep    STRATEGIES   tactics DO:  Patient was dx with sleep apnea and is now using a cpap machine to sleep with 7-9 hours of sleep per night.  Get plenty of exercise - patient increased from 5k to 8k steps per day in the last month.   Patient is participating in hobbies extensively which is keeping her mind active.   Consider environmental factors that can affect sleep   Consider dietary factors that can affect sleep.         Change your home to make it safe for you    DO:                  Simplifying the way you set up daily activities and routines DO: DON'T:                        PRACTICE  Based on what we have talked about, you are willing to try:  ___________keep doing what you are - you have made great improvements!  ________________________________________________________________  If an idea does not work the first time, try it again.  We may make some changes over the next few sessions, based on how they work.    Paula Booker, Paula Booker,  Paula Booker          11/29/22 __________________________________________________    _______________________ Occupational Therapist          Date       COMPLETED: Patient Stated   On track    Improve energy level by applying energy conservation techniques to daily tasks.  Name _____________________________              Date__________________  OT Action Plan: Generic  Target Problem Area:   Decreased energy level  Why Problem May Occur:     Breathing issues   Poor quality and amount of sleep          Target Goal(s):   Independently apply energy conservation principles to daily tasks.    STRATEGIES   Saving your Energy DO:  Follow 4 P's:  prioritize, pace, positioning, and plan - see handout for further details   Take breaks from activities before you tire   Set alarm to remember to take break             Change your home to make it safe for you    DO:  Simplifying the way you set up daily activities and routines DO: DON'T:                        PRACTICE  Based on what we have talked about, you are willing to try:  ________see above________________________________________________________  ________________________________________________________________  If an idea does not work the first time, try it again.  We may make some changes over the next few sessions, based on how they work.     ____[name], Paula Booker, OT/L______________________________________________    _______________________ Occupational Therapist          Date       COMPLETED: patient stated   On track    Improve safety entering and exiting home.  Name _________________________________             Date__________________  OT ACTION PLAN: Functional Mobility  Target Problem Area:  Unsafe entering and exiting home   Why Problem May Occur:     Stairs uneven Stairs very tall and steep No hand rails          Target Goal(s):   Safely enter  and exit home     STRATEGIES   Saving Your Energy DO:  Take breaks  Raise the height of surfaces   X Remove tripping hazards  (Other):   (Other):   (Other):     Modifying your home environment and making it safe    DO:  X Install grab bars in the bathroom  Remove or strongly secure throw rugs  X stairs replaced with 6 inch steps.  Handrails installed for bilateral access.   (Other):   (Other):     Simplifying the way you set up tasks or daily routines DO:  Move slowly   (Other):   (Other):   (Other):   (Other):      PRACTICE  Based on what we have talked about, you are willing to try:  ________________________________________________________________  ________________________________________________________________  If a strategy does not work the first time, try it again (and again).  We may make some changes over the next few sessions, based on how they work.   Paula Booker, Paula Booker, Paula Booker [phone]  ________________________________________________   __________ Occupational Therapist          Date         COMPLETED: Patient Stated   On track    Improve safety entering and exiting tub Tub removed and replaced with walk in shower that has grab bars at back and entrance of shower.  Has hand held shower and patient voices significant improvement in safety with transfers and bathing tasks.         Post Clinical Reasoning: Client Action (Goal) Three Interventions: Functional mobility:  Patient will improve safety entering and exiting home and entering and exiting shower. Did Client Try?: Yes Targeted Problem Area Status: A Lot Better Clinician View Of Client Situation:: Paula Booker is doing well.  Medically she is just getting over a bad bacterial infection.  She is able to enter and exit her home with improved safety and shower transfers are much safer.  I have no doubt  she will continue to advocate for herself. Client View Of His/Her  Situation:: Ecstatic over her new walk in shower and her new stairs and handrails that [facility] installed. Next Visit Plan:: DC from OT Aging Gracefully services this date. Paula Booker, Paula Booker, Paula Booker [phone]

## 2023-03-06 ENCOUNTER — Ambulatory Visit: Payer: Medicare HMO | Attending: Internal Medicine

## 2023-03-06 DIAGNOSIS — I5022 Chronic systolic (congestive) heart failure: Secondary | ICD-10-CM

## 2023-03-06 DIAGNOSIS — Z95810 Presence of automatic (implantable) cardiac defibrillator: Secondary | ICD-10-CM

## 2023-03-07 NOTE — Progress Notes (Signed)
EPIC Encounter for ICM Monitoring  Patient Name: Paula Booker is a 77 y.o. female Date: 03/07/2023 Primary Care Physican: Henrine Screws, MD Primary Cardiologist: Eldridge Dace Electrophysiologist: Ladona Ridgel BiV Pacing: 94.5% 02/08/2022 Weight: 183 lbs 04/21/2022 Weight: 180 lbs 06/29/2022 Weight: 181 lbs 08/01/2022 Office Weight: 191 lbs 09/07/2022 Weight: 191 lbs 01/27/2023 Weight: unknown   Time in AT/AF  <0.1 hours/day (<0.1 %)          Spoke with patient and heart failure questions reviewed.  Transmission results reviewed.  Pt reports she is feeling better and recovering from her cold.   Her feet swell while standing and wearing compressions stockings.  She realized she was drinking a lot of soup while she was sick which contributed to fluid accumulation.    Optivol thoracic impedance suggesting fluid levels returned to normal.     Prescribed:  Furosemide 40 mg take 1-2 tablets (40 mg - 80 mg total) by mouth as directed once daily for edema, SOB or weight gain.   Pt reports on 9/11 taking 1 tablet daily and takes 2nd one if needed for fluid symptoms. Spironolactone 25 mg take 1 tablet by mouth daily   Labs: 12/03/2022 Creatinine 0.80, BUN 30, Potassium 4.3, Sodium 137 05/20/2022 Creatinine 0.90, BUN 19, Potassium 4.1, Sodium 135, GFR >60  05/19/2022 Creatinine 0.94, BUN 19, Potassium 3.7, Sodium 139, GFR >60  A complete set of results can be found in Results Review.   Recommendations:  No changes and encouraged to call if experiencing any fluid symptoms.   Follow-up plan: ICM clinic phone appointment on 04/03/2023.   91 day device clinic remote transmission 05/17/2023.     EP/Cardiology Office Visits:   Recall 08/18/2023 with Dr Ladona Ridgel.    Recall 07/27/2023 with Dr Eldridge Dace.     Copy of ICM check sent to Dr. Ladona Ridgel.   3 month ICM trend: 03/05/2023.    12-14 Month ICM trend:     Karie Soda, RN 03/07/2023 9:18 AM

## 2023-03-07 NOTE — Progress Notes (Signed)
Remote ICD transmission.   

## 2023-03-10 ENCOUNTER — Other Ambulatory Visit: Payer: Self-pay | Admitting: Interventional Cardiology

## 2023-03-10 ENCOUNTER — Other Ambulatory Visit: Payer: Self-pay | Admitting: Internal Medicine

## 2023-03-10 DIAGNOSIS — I480 Paroxysmal atrial fibrillation: Secondary | ICD-10-CM

## 2023-03-13 NOTE — Telephone Encounter (Signed)
Prescription refill request for Eliquis received. Indication: Afib  Last office visit: 08/18/22 Ladona Ridgel)  Scr: 0.79 (12/03/22)  Age: 77 Weight: 83.5kg  Appropriate dose. Refill sent.

## 2023-03-20 ENCOUNTER — Other Ambulatory Visit: Payer: Self-pay | Admitting: Pulmonary Disease

## 2023-03-23 DIAGNOSIS — D49511 Neoplasm of unspecified behavior of right kidney: Secondary | ICD-10-CM | POA: Diagnosis not present

## 2023-03-30 ENCOUNTER — Other Ambulatory Visit: Payer: Self-pay | Admitting: Gastroenterology

## 2023-03-30 ENCOUNTER — Telehealth: Payer: Self-pay | Admitting: *Deleted

## 2023-03-30 NOTE — Telephone Encounter (Signed)
   Pre-operative Risk Assessment    Patient Name: Paula Booker  DOB: March 02, 1946 MRN: [id number]      Request for Surgical Clearance    Procedure:   COLONOSCOPY / ENDOSCOPY  Date of Surgery:  Clearance 05/23/23                                 Surgeon:  DR. Levora Angel Surgeon's Group or Practice Name:  [facility] Phone number:  [phone] Fax number:  [phone]   Type of Clearance Requested:   - Medical  - Pharmacy:  Hold Apixaban (Eliquis) X'S 1-2   Type of Anesthesia:   PROPOFOL   Additional requests/questions:    Wilhemina Cash   03/30/2023, 9:58 AM

## 2023-03-30 NOTE — Telephone Encounter (Signed)
Pharmacy please advise on holding Eliquis prior to colonoscopy and endoscopy scheduled for 05/23/2023. Thank you.

## 2023-03-31 NOTE — Telephone Encounter (Signed)
Patient with diagnosis of atrial fibrillation on Eliquis for anticoagulation.    Procedure: colonoscopy/endoscopy Date of procedure: 05/23/23   CHA2DS2-VASc Score = 8   This indicates a 10.8% annual risk of stroke. The patient's score is based upon: CHF History: 1 HTN History: 1 Diabetes History: 1 Stroke History: 2 Vascular Disease History: 0 Age Score: 2 Gender Score: 1   Note that patient was in ED for TIA 3 days after her ICD insertion 05/2022  CrCl 82 Platelet count 230.  Would recommend to hold Eliquis only 1 day prior to upcoming procedure, but due to history of TIA when off anticoagulation as well as high CHADS2-VASc score, will confirm with cardiologist  **This guidance is not considered finalized until pre-operative APP has relayed final recommendations.**

## 2023-04-06 NOTE — Progress Notes (Signed)
No ICM remote transmission received for 04/03/2023 and next ICM transmission scheduled for 04/24/2023.

## 2023-04-11 DIAGNOSIS — D49511 Neoplasm of unspecified behavior of right kidney: Secondary | ICD-10-CM | POA: Diagnosis not present

## 2023-04-11 DIAGNOSIS — N289 Disorder of kidney and ureter, unspecified: Secondary | ICD-10-CM | POA: Diagnosis not present

## 2023-04-11 DIAGNOSIS — K449 Diaphragmatic hernia without obstruction or gangrene: Secondary | ICD-10-CM | POA: Diagnosis not present

## 2023-04-14 DIAGNOSIS — N281 Cyst of kidney, acquired: Secondary | ICD-10-CM | POA: Diagnosis not present

## 2023-04-18 DIAGNOSIS — K7460 Unspecified cirrhosis of liver: Secondary | ICD-10-CM | POA: Diagnosis not present

## 2023-04-18 DIAGNOSIS — E669 Obesity, unspecified: Secondary | ICD-10-CM | POA: Diagnosis not present

## 2023-04-18 DIAGNOSIS — I480 Paroxysmal atrial fibrillation: Secondary | ICD-10-CM | POA: Diagnosis not present

## 2023-04-18 DIAGNOSIS — F331 Major depressive disorder, recurrent, moderate: Secondary | ICD-10-CM | POA: Diagnosis not present

## 2023-04-18 DIAGNOSIS — I110 Hypertensive heart disease with heart failure: Secondary | ICD-10-CM | POA: Diagnosis not present

## 2023-04-18 DIAGNOSIS — Z6834 Body mass index (BMI) 34.0-34.9, adult: Secondary | ICD-10-CM | POA: Diagnosis not present

## 2023-04-26 NOTE — Progress Notes (Signed)
 No ICM remote transmission received for 04/24/2023 and next ICM transmission scheduled for 05/02/2023.

## 2023-04-27 ENCOUNTER — Other Ambulatory Visit: Payer: Self-pay | Admitting: Pulmonary Disease

## 2023-05-02 ENCOUNTER — Other Ambulatory Visit: Payer: Self-pay

## 2023-05-02 ENCOUNTER — Ambulatory Visit: Payer: Medicare HMO | Attending: Internal Medicine

## 2023-05-02 DIAGNOSIS — Z95810 Presence of automatic (implantable) cardiac defibrillator: Secondary | ICD-10-CM

## 2023-05-02 DIAGNOSIS — I5022 Chronic systolic (congestive) heart failure: Secondary | ICD-10-CM | POA: Diagnosis not present

## 2023-05-02 NOTE — Patient Outreach (Signed)
Aging Gracefully Program  05/02/2023  Paula Booker 21-Sep-1945 [id number]   [facility] Evaluation Interviewer attempted to call patient on today regarding Aging Gracefully referral. No answer from patient after multiple rings. CMA left confidential voicemail for patient to return call.  Will attempt to call back within 1 week.    [facility]  Population Health Care Management Assistant  Direct Dial: [phone]  Fax: [phone] Website: [email or web address]

## 2023-05-03 NOTE — Telephone Encounter (Signed)
   Name: Paula Booker  DOB: 18-Oct-1945  MRN: [id number]  Primary Cardiologist: Lance Muss, MD   Preoperative team, please contact this patient and set up a phone call appointment for further preoperative risk assessment. Please obtain consent and complete medication review. Thank you for your help.  I confirm that guidance regarding antiplatelet and oral anticoagulation therapy has been completed and, if necessary, noted below.  Ok to hold her blood thinner up to 3 days before the procedure. Restart when the bleeding risk is acceptable. GT   I also confirmed the patient resides in the state of [place]. As per [facility] Medical Board telemedicine laws, the patient must reside in the state in which the provider is licensed.   Napoleon Form, Leodis Rains, NP 05/03/2023, 6:05 PM [facility]

## 2023-05-03 NOTE — Telephone Encounter (Signed)
Ok to hold her blood thinner up to 3 days before the procedure. Restart when the bleeding risk is acceptable. GT

## 2023-05-04 ENCOUNTER — Telehealth: Payer: Self-pay

## 2023-05-04 NOTE — Telephone Encounter (Signed)
I s/w the pt and she tells me that she is not going to cancel her procedure. Pt said if she reschedules she will let us know.   I will update all parties involved.

## 2023-05-04 NOTE — Telephone Encounter (Signed)
ICM Call to patient and assisted with sending manual remote transmission via phone app.

## 2023-05-04 NOTE — Progress Notes (Addendum)
EPIC Encounter for ICM Monitoring  Patient Name: Paula Booker is a 78 y.o. female Date: 05/04/2023 Primary Care Physican: Henrine Screws, MD Primary Cardiologist: Eldridge Dace Electrophysiologist: Ladona Ridgel BiV Pacing: 91.4% 02/08/2022 Weight: 183 lbs 04/21/2022 Weight: 180 lbs 06/29/2022 Weight: 181 lbs 08/01/2022 Office Weight: 191 lbs 09/07/2022 Weight: 191 lbs 01/27/2023 Weight: unknown 05/04/2023 Weight: 189 lbs   Monitored VT (133-194 bpm) 1 on 03/18/23 VT-NS (>4 beats, >194 bpm) 1 Time in AT/AF  0.0 hours/day (0.0 %)          Spoke with patient and heart failure questions reviewed.  Transmission results reviewed.  Pt reports she is feeling well.   Optivol thoracic impedance suggesting normal fluid levels with the exception of possible fluid accumulation from 12/30-1/4.  Message sent to device clinic 1/23 regarding monitored VT.  Pt's monitor was disconnected from 03/17/23 until 05/04/23.   Prescribed:  Furosemide 40 mg take 1-2 tablets (40 mg - 80 mg total) by mouth as directed once daily for edema, SOB or weight gain.   Pt reports on 9/11 taking 1 tablet daily and takes 2nd one if needed for fluid symptoms. Spironolactone 25 mg take 1 tablet by mouth daily   Labs: 12/03/2022 Creatinine 0.80, BUN 30, Potassium 4.3, Sodium 137 05/20/2022 Creatinine 0.90, BUN 19, Potassium 4.1, Sodium 135, GFR >60  05/19/2022 Creatinine 0.94, BUN 19, Potassium 3.7, Sodium 139, GFR >60  A complete set of results can be found in Results Review.   Recommendations:  No changes and encouraged to call if experiencing any fluid symptoms.   Follow-up plan: ICM clinic phone appointment on 06/05/2023.   91 day device clinic remote transmission 05/17/2023.     EP/Cardiology Office Visits:   Recall 08/18/2023 with Dr Ladona Ridgel.    Recall 07/27/2023 with Dr Eldridge Dace.     Copy of ICM check sent to Dr. Ladona Ridgel.   3 month ICM trend: 05/04/2023.    12-14 Month ICM trend:     Karie Soda, RN 05/04/2023 10:31  AM

## 2023-05-15 DIAGNOSIS — I428 Other cardiomyopathies: Secondary | ICD-10-CM | POA: Diagnosis not present

## 2023-05-15 DIAGNOSIS — Z6834 Body mass index (BMI) 34.0-34.9, adult: Secondary | ICD-10-CM | POA: Diagnosis not present

## 2023-05-15 DIAGNOSIS — I10 Essential (primary) hypertension: Secondary | ICD-10-CM | POA: Diagnosis not present

## 2023-05-15 DIAGNOSIS — Z0000 Encounter for general adult medical examination without abnormal findings: Secondary | ICD-10-CM | POA: Diagnosis not present

## 2023-05-15 DIAGNOSIS — F331 Major depressive disorder, recurrent, moderate: Secondary | ICD-10-CM | POA: Diagnosis not present

## 2023-05-15 DIAGNOSIS — R739 Hyperglycemia, unspecified: Secondary | ICD-10-CM | POA: Diagnosis not present

## 2023-05-15 DIAGNOSIS — E66811 Obesity, class 1: Secondary | ICD-10-CM | POA: Diagnosis not present

## 2023-05-15 DIAGNOSIS — I700 Atherosclerosis of aorta: Secondary | ICD-10-CM | POA: Diagnosis not present

## 2023-05-15 DIAGNOSIS — I480 Paroxysmal atrial fibrillation: Secondary | ICD-10-CM | POA: Diagnosis not present

## 2023-05-15 DIAGNOSIS — E782 Mixed hyperlipidemia: Secondary | ICD-10-CM | POA: Diagnosis not present

## 2023-05-17 ENCOUNTER — Ambulatory Visit: Payer: Medicare HMO

## 2023-05-17 ENCOUNTER — Encounter: Payer: Self-pay | Admitting: Pulmonary Disease

## 2023-05-17 DIAGNOSIS — I428 Other cardiomyopathies: Secondary | ICD-10-CM

## 2023-05-19 ENCOUNTER — Encounter: Payer: Self-pay | Admitting: Internal Medicine

## 2023-05-19 LAB — CUP PACEART REMOTE DEVICE CHECK
Battery Remaining Longevity: 124 mo
Battery Voltage: 3.01 V
Brady Statistic RV Percent Paced: 3.27 %
Date Time Interrogation Session: 20250206093141
HighPow Impedance: 81 Ohm
Implantable Lead Connection Status: 753985
Implantable Lead Connection Status: 753985
Implantable Lead Connection Status: 753985
Implantable Lead Implant Date: 20131212
Implantable Lead Implant Date: 20131212
Implantable Lead Implant Date: 20240205
Implantable Lead Location: 753858
Implantable Lead Location: 753859
Implantable Lead Location: 753860
Implantable Lead Model: 4598
Implantable Lead Model: 5076
Implantable Lead Model: 6935
Implantable Pulse Generator Implant Date: 20240205
Lead Channel Impedance Value: 1007 Ohm
Lead Channel Impedance Value: 1026 Ohm
Lead Channel Impedance Value: 1045 Ohm
Lead Channel Impedance Value: 1140 Ohm
Lead Channel Impedance Value: 361 Ohm
Lead Channel Impedance Value: 418 Ohm
Lead Channel Impedance Value: 437 Ohm
Lead Channel Impedance Value: 646 Ohm
Lead Channel Impedance Value: 703 Ohm
Lead Channel Impedance Value: 741 Ohm
Lead Channel Impedance Value: 779 Ohm
Lead Channel Impedance Value: 874 Ohm
Lead Channel Impedance Value: 950 Ohm
Lead Channel Pacing Threshold Amplitude: 0.375 V
Lead Channel Pacing Threshold Amplitude: 0.75 V
Lead Channel Pacing Threshold Amplitude: 1.25 V
Lead Channel Pacing Threshold Pulse Width: 0.4 ms
Lead Channel Pacing Threshold Pulse Width: 0.4 ms
Lead Channel Pacing Threshold Pulse Width: 0.4 ms
Lead Channel Sensing Intrinsic Amplitude: 3.4 mV
Lead Channel Sensing Intrinsic Amplitude: 6.5 mV
Lead Channel Setting Pacing Amplitude: 2 V
Lead Channel Setting Pacing Amplitude: 2 V
Lead Channel Setting Pacing Amplitude: 2.5 V
Lead Channel Setting Pacing Pulse Width: 0.4 ms
Lead Channel Setting Pacing Pulse Width: 0.4 ms
Lead Channel Setting Sensing Sensitivity: 0.3 mV
Zone Setting Status: 755011
Zone Setting Status: 755011

## 2023-05-22 ENCOUNTER — Other Ambulatory Visit: Payer: Medicare HMO

## 2023-05-23 ENCOUNTER — Encounter (HOSPITAL_COMMUNITY): Payer: Self-pay

## 2023-05-23 ENCOUNTER — Ambulatory Visit (HOSPITAL_COMMUNITY): Admit: 2023-05-23 | Payer: Medicare HMO | Admitting: Gastroenterology

## 2023-05-23 SURGERY — COLONOSCOPY WITH PROPOFOL
Anesthesia: Monitor Anesthesia Care

## 2023-05-29 ENCOUNTER — Ambulatory Visit
Admission: RE | Admit: 2023-05-29 | Discharge: 2023-05-29 | Disposition: A | Discharge status: Home / Self Care | Payer: Medicare HMO | Source: Ambulatory Visit | Attending: Pulmonary Disease | Admitting: Pulmonary Disease

## 2023-05-29 DIAGNOSIS — R911 Solitary pulmonary nodule: Secondary | ICD-10-CM | POA: Diagnosis not present

## 2023-05-31 ENCOUNTER — Other Ambulatory Visit: Payer: Self-pay

## 2023-05-31 NOTE — Patient Outreach (Signed)
Aging Gracefully Program  05/31/2023  Paula Booker March 05, 1946 [id number]   [facility] Evaluation Interviewer made contact with patient. Aging Gracefully 5 month survey completed.    [facility]  Population Health Care Management Assistant  Direct Dial: [phone]  Fax: [phone] Website: [email or web address]

## 2023-05-31 NOTE — Patient Outreach (Signed)
Aging Gracefully Program  05/31/2023  Paula Booker 05-14-1945 [id number]   [facility] Evaluation Interviewer attempted to call patient on today regarding Aging Gracefully referral. No answer from patient after multiple rings. CMA left confidential voicemail for patient to return call.  Will attempt to call back within 1 week.    [facility]  Population Health Care Management Assistant  Direct Dial: [phone]  Fax: [phone] Website: [email or web address]

## 2023-06-05 ENCOUNTER — Ambulatory Visit: Payer: Medicare HMO | Attending: Internal Medicine

## 2023-06-05 DIAGNOSIS — Z95810 Presence of automatic (implantable) cardiac defibrillator: Secondary | ICD-10-CM

## 2023-06-05 DIAGNOSIS — I5022 Chronic systolic (congestive) heart failure: Secondary | ICD-10-CM | POA: Diagnosis not present

## 2023-06-07 NOTE — Progress Notes (Signed)
 EPIC Encounter for ICM Monitoring  Patient Name: Paula Booker is a 78 y.o. female Date: 06/07/2023 Primary Care Physican: Henrine Screws, MD Primary Cardiologist: Eldridge Dace Electrophysiologist: Ladona Ridgel BiV Pacing: 93.1% 02/08/2022 Weight: 183 lbs 04/21/2022 Weight: 180 lbs 06/29/2022 Weight: 181 lbs 08/01/2022 Office Weight: 191 lbs 09/07/2022 Weight: 191 lbs 01/27/2023 Weight: unknown 05/04/2023 Weight: 189 lbs 06/07/2023 Weight: 186 lbs  Time in AT/AF  0.0 hours/day (0.0 %)          Spoke with patient and heart failure questions reviewed.  Transmission results reviewed.  Pt reports she is feeling well but did have some swelling in her feet this past weekend and took extra fluid pill.  She takes 2 fluid pills if she is standing on her feet all day.    Optivol thoracic impedance suggesting normal fluid levels with the exception of possible fluid accumulation from 2/2-2/12.     Prescribed:  Furosemide 40 mg take 1-2 tablets (40 mg - 80 mg total) by mouth as directed once daily for edema, SOB or weight gain.   Pt reports on 9/11 taking 1 tablet daily and takes 2nd one if needed for fluid symptoms. Spironolactone 25 mg take 1 tablet by mouth daily   Labs: 12/03/2022 Creatinine 0.80, BUN 30, Potassium 4.3, Sodium 137 05/20/2022 Creatinine 0.90, BUN 19, Potassium 4.1, Sodium 135, GFR >60  05/19/2022 Creatinine 0.94, BUN 19, Potassium 3.7, Sodium 139, GFR >60  A complete set of results can be found in Results Review.   Recommendations:  No changes and encouraged to call if experiencing any fluid symptoms.   Follow-up plan: ICM clinic phone appointment on 07/10/2023.   91 day device clinic remote transmission 08/16/2023.     EP/Cardiology Office Visits:   08/22/2023 with Dr Ladona Ridgel.    Recall 07/27/2023 with Dr Eldridge Dace.     Copy of ICM check sent to Dr. Ladona Ridgel.   3 month ICM trend: 06/05/2023.    12-14 Month ICM trend:     Karie Soda, RN 06/07/2023 3:08 PM

## 2023-06-13 ENCOUNTER — Other Ambulatory Visit: Payer: Self-pay | Admitting: Internal Medicine

## 2023-06-14 ENCOUNTER — Other Ambulatory Visit: Payer: Self-pay | Admitting: Pulmonary Disease

## 2023-06-18 ENCOUNTER — Emergency Department (HOSPITAL_COMMUNITY)

## 2023-06-18 ENCOUNTER — Emergency Department (HOSPITAL_COMMUNITY)
Admission: EM | Admit: 2023-06-18 | Discharge: 2023-06-19 | Disposition: A | Discharge status: Home / Self Care | Attending: Emergency Medicine | Admitting: Emergency Medicine

## 2023-06-18 ENCOUNTER — Encounter (HOSPITAL_COMMUNITY): Payer: Self-pay

## 2023-06-18 DIAGNOSIS — M549 Dorsalgia, unspecified: Secondary | ICD-10-CM | POA: Insufficient documentation

## 2023-06-18 DIAGNOSIS — Z85828 Personal history of other malignant neoplasm of skin: Secondary | ICD-10-CM | POA: Diagnosis not present

## 2023-06-18 DIAGNOSIS — I110 Hypertensive heart disease with heart failure: Secondary | ICD-10-CM | POA: Insufficient documentation

## 2023-06-18 DIAGNOSIS — J45909 Unspecified asthma, uncomplicated: Secondary | ICD-10-CM | POA: Diagnosis not present

## 2023-06-18 DIAGNOSIS — Z79899 Other long term (current) drug therapy: Secondary | ICD-10-CM | POA: Insufficient documentation

## 2023-06-18 DIAGNOSIS — I509 Heart failure, unspecified: Secondary | ICD-10-CM | POA: Insufficient documentation

## 2023-06-18 DIAGNOSIS — X501XXA Overexertion from prolonged static or awkward postures, initial encounter: Secondary | ICD-10-CM | POA: Diagnosis not present

## 2023-06-18 DIAGNOSIS — I10 Essential (primary) hypertension: Secondary | ICD-10-CM | POA: Diagnosis not present

## 2023-06-18 DIAGNOSIS — Z7901 Long term (current) use of anticoagulants: Secondary | ICD-10-CM | POA: Diagnosis not present

## 2023-06-18 DIAGNOSIS — M546 Pain in thoracic spine: Secondary | ICD-10-CM | POA: Diagnosis not present

## 2023-06-18 DIAGNOSIS — R0602 Shortness of breath: Secondary | ICD-10-CM | POA: Diagnosis not present

## 2023-06-18 LAB — BRAIN NATRIURETIC PEPTIDE: B Natriuretic Peptide: 42.8 pg/mL (ref 0.0–100.0)

## 2023-06-18 LAB — CBC
HCT: 42.2 % (ref 36.0–46.0)
Hemoglobin: 14.3 g/dL (ref 12.0–15.0)
MCH: 31.2 pg (ref 26.0–34.0)
MCHC: 33.9 g/dL (ref 30.0–36.0)
MCV: 91.9 fL (ref 80.0–100.0)
Platelets: 276 10*3/uL (ref 150–400)
RBC: 4.59 MIL/uL (ref 3.87–5.11)
RDW: 12.5 % (ref 11.5–15.5)
WBC: 8.9 10*3/uL (ref 4.0–10.5)
nRBC: 0 % (ref 0.0–0.2)

## 2023-06-18 LAB — RESP PANEL BY RT-PCR (RSV, FLU A&B, COVID)  RVPGX2
Influenza A by PCR: NEGATIVE
Influenza B by PCR: NEGATIVE
Resp Syncytial Virus by PCR: NEGATIVE
SARS Coronavirus 2 by RT PCR: NEGATIVE

## 2023-06-18 LAB — URINALYSIS, ROUTINE W REFLEX MICROSCOPIC
Bacteria, UA: NONE SEEN
Bilirubin Urine: NEGATIVE
Glucose, UA: NEGATIVE mg/dL
Hgb urine dipstick: NEGATIVE
Ketones, ur: NEGATIVE mg/dL
Nitrite: NEGATIVE
Protein, ur: NEGATIVE mg/dL
Specific Gravity, Urine: 1.008 (ref 1.005–1.030)
pH: 5 (ref 5.0–8.0)

## 2023-06-18 LAB — BASIC METABOLIC PANEL
Anion gap: 12 (ref 5–15)
BUN: 28 mg/dL — ABNORMAL HIGH (ref 8–23)
CO2: 24 mmol/L (ref 22–32)
Calcium: 9.9 mg/dL (ref 8.9–10.3)
Chloride: 102 mmol/L (ref 98–111)
Creatinine, Ser: 0.91 mg/dL (ref 0.44–1.00)
GFR, Estimated: >60 mL/min (ref >60)
Glucose, Bld: 117 mg/dL — ABNORMAL HIGH (ref 70–99)
Potassium: 3.7 mmol/L (ref 3.5–5.1)
Sodium: 138 mmol/L (ref 135–145)

## 2023-06-18 LAB — TROPONIN I (HIGH SENSITIVITY)
Troponin I (High Sensitivity): 8 ng/L (ref <18)
Troponin I (High Sensitivity): 8 ng/L (ref <18)

## 2023-06-18 MED ORDER — ONDANSETRON 4 MG PO TBDP: 4.0000 mg | ORAL_TABLET | Freq: Once | ORAL | Status: DC

## 2023-06-18 NOTE — ED Provider Triage Note (Signed)
 Emergency Medicine Provider Triage Evaluation Note  Paula Booker , a 78 y.o. female  was evaluated in triage.  Pt complains of generalized weakness and SOB since yesterday.  Review of Systems  Positive: Generalized weakness, SOB Negative: CP, fever  Physical Exam  BP 108/61 (BP Location: Right Arm)   Pulse 92   Temp 98.1 F (36.7 C)   Resp (!) 22   Ht 5\' 4"  (1.626 m)   Wt 83.5 kg   SpO2 96%   BMI 31.58 kg/m  Gen:   Awake, no distress   Resp:  Normal effort  MSK:   Moves extremities without difficulty  Other:    Medical Decision Making  Medically screening exam initiated at 3:21 PM.  Appropriate orders placed.  LEATHER ESTIS was informed that the remainder of the evaluation will be completed by another provider, this initial triage assessment does not replace that evaluation, and the importance of remaining in the ED until their evaluation is complete.   Maxwell Marion, PA-C 06/18/23 [phone]

## 2023-06-18 NOTE — ED Triage Notes (Signed)
 Pt c/o intermittent mid back pain, nausea, and SOB x 2 days.  Pt reports increased fatigue today.  Pain score 4/10.    Pt has a significant cardiac history.

## 2023-06-19 MED ORDER — METHOCARBAMOL 500 MG PO TABS: 500.0000 mg | ORAL_TABLET | Freq: Two times a day (BID) | ORAL | 0 refills | Status: DC

## 2023-06-19 MED ORDER — ACETAMINOPHEN 500 MG PO TABS
1000.0000 mg | ORAL_TABLET | Freq: Once | ORAL | Status: AC
  Administered 2023-06-19: 1000 mg via ORAL

## 2023-06-19 NOTE — ED Provider Notes (Signed)
 [place] [facility]GENC[facility] DEPARTMENT AT [facility] Provider Note  CSN: [id number] Arrival date & time: 06/18/23 1457  Chief Complaint(s) Back Pain, Weakness, and Nausea  HPI Paula Booker is a 78 y.o. female with a past medical history listed below including numerous compression fractures status post kyphoplasty here for upper back pain for 2 to 3 days.  She reports being at a hockey game when the pain started.  She reports leaning forward in her chair when the pain began.  Initially mild and progressively worsened throughout the past several days.  Worse with movement and palpation.  She denies any fall or trauma.  She has been taking Tylenol for pain which does provide some relief.  Denies any coughing.  Reports 1 to 2 days of mild shortness of breath and generalized fatigue.  Denies any known fevers.  No URI symptoms.  No lower extremity edema.  No chest pain. No N/V/D.  No other physical complaints  The history is provided by the patient.    Past Medical History Past Medical History:  Diagnosis Date   AICD (automatic cardioverter/defibrillator) present    Anemia    as a child   Anxiety    Arthritis    "hands and knees" (03/22/2012)   Asthma    Basal cell carcinoma 04/27/2011   mid forehead(MOHS)   Bursitis    CHF (congestive heart failure) (HCC) 09/2011   pt. denies   Depression    Dysrhythmia    WPW   GERD (gastroesophageal reflux disease)    Head injury, acute, with loss of consciousness (HCC)    History of hiatal hernia    Hypertension    ICD (implantable cardiac defibrillator) in place 03/2012   Migraines    migraines    Nausea vomiting and diarrhea 03/18/2015   NICM (nonischemic cardiomyopathy) (HCC)    Obesity (BMI 30-39.9)    negative sleep study 2014   OSA (obstructive sleep apnea) 05/27/2018   Mild with AHI 6/hr    no cpap   Pneumonia    hx   Shortness of breath    "@ any time I can get SOB" (03/22/2012)   Skin cancer 1999   Squamous cell  carcinoma of skin 07/08/2020   in situ right malar cheek-[id number]   Sudden arrhythmia death syndrome    WPW (Wolff-Parkinson-White syndrome)    Patient Active Problem List   Diagnosis Date Noted   Lung nodule 06/03/2022   Infection of pacemaker pocket (HCC) 05/20/2022   Cerebral infarction (HCC) 05/19/2022   Acute gout involving toe of right foot 11/25/2021   Age-related osteoporosis without current pathological fracture 11/25/2021   Basal cell carcinoma 11/25/2021   Chronic pain 11/25/2021   Collapsed vertebra, not elsewhere classified, site unspecified, subsequent encounter for fracture with routine healing 11/25/2021   Congestive heart failure (HCC) 11/25/2021   Exacerbation of asthma 11/25/2021   Hardening of the aorta (main artery of the heart) (HCC) 11/25/2021   Hypercoagulable state (HCC) 11/25/2021   Gout 11/25/2021   Intestinal malabsorption 11/25/2021   Mild intermittent asthma 11/25/2021   Mixed hyperlipidemia 11/25/2021   Moderate recurrent major depression (HCC) 11/25/2021   Primary insomnia 11/25/2021   Seasonal allergic rhinitis 11/25/2021   Severe recurrent major depression without psychotic features (HCC) 11/25/2021   Unspecified cirrhosis of liver (HCC) 11/25/2021   Osteoarthritis of knee 11/25/2021   Paroxysmal atrial fibrillation (HCC) 08/20/2019   Acute medial meniscus tear of right knee 08/09/2019   Chondromalacia, right knee 08/09/2019  SVT (supraventricular tachycardia) (HCC) 11/02/2018   ICD (implantable cardioverter-defibrillator) in place 11/02/2018   OSA (obstructive sleep apnea) 05/27/2018   Acute medial meniscus tear, left, initial encounter 11/29/2017   Chondromalacia of left knee 11/29/2017   Migraines 09/23/2017   Anxiety 09/23/2017   Depression 09/23/2017   Pacemaker 09/23/2017   Chronic systolic heart failure (HCC) 04/28/2015   Diarrhea 03/18/2015   CAP (community acquired pneumonia) 03/18/2015   Nausea vomiting and diarrhea 03/18/2015    Compression fracture 06/05/2014   Essential hypertension 03/27/2014   Chest pain 01/27/2014   Normal coronary arteries 01/27/2014   Obesity (BMI 30-39.9)    ICD- MDT implanted Dec 2013 03/26/2013   NICM (nonischemic cardiomyopathy) (HCC) 02/21/2013   GERD (gastroesophageal reflux disease) 04/23/2012   Asthma in adult, mild persistent, uncomplicated 04/23/2012   Shortness of breath 04/23/2012   Syncope 03/01/2012   Acute on chronic systolic CHF (congestive heart failure) (HCC) 03/01/2012   Type B WPW syndrome- RFA Dec 2013 03/01/2012   Home Medication(s) Prior to Admission medications   Medication Sig Start Date End Date Taking? Authorizing Provider  methocarbamol (ROBAXIN) 500 MG tablet Take 1 tablet (500 mg total) by mouth 2 (two) times daily. 06/19/23  Yes Cherina Dhillon, Amadeo Garnet, MD  acetaminophen (TYLENOL) 500 MG tablet Take 500 mg by mouth in the morning and at bedtime. Patient not taking: Reported on 09/30/2022    [provider]  albuterol (VENTOLIN HFA) 108 (90 Base) MCG/ACT inhaler Inhale 2 puffs into the lungs every 6 (six) hours as needed for wheezing or shortness of breath. 05/05/23   Mannam, Colbert Coyer, MD  apixaban (ELIQUIS) 5 MG TABS tablet Take 1 tablet by mouth twice daily 03/13/23   Marinus Maw, MD  Calcium Carb-Cholecalciferol 600-20 MG-MCG TABS Take 1 tablet by mouth in the morning.    [provider]  carvedilol (COREG) 6.25 MG tablet Take 1 tablet by mouth twice daily 06/15/23   Marinus Maw, MD  cetirizine (ZYRTEC) 10 MG tablet Take 10 mg by mouth in the morning.    [provider]  Cholecalciferol (VITAMIN D3) 5000 units CAPS Take 5,000 Units by mouth in the morning.    [provider]  Collagen-Vitamin C (COLLAGEN PLUS VITAMIN C PO) Take 1 tablet by mouth in the morning.    [provider]  Cyanocobalamin (VITAMIN B-12) 5000 MCG LOZG Take 5,000 mcg by mouth in the morning.    [provider]  escitalopram  (LEXAPRO) 20 MG tablet Take 20 mg by mouth in the morning. 10/23/20   [provider]  fluticasone furoate-vilanterol (BREO ELLIPTA) 200-25 MCG/ACT AEPB Inhale 1 puff by mouth once daily 06/14/23   Mannam, Praveen, MD  furosemide (LASIX) 40 MG tablet TAKE 1 TO 2 TABLETS BY MOUTH AS DIRECTED ONCE DAILY FOR  EDEMA,  SHORTNESS  OF  BREATH  OR  WEIGHT  GAIN. 12/14/22   Marinus Maw, MD  ipratropium-albuterol (DUONEB) 0.5-2.5 (3) MG/3ML SOLN Take 3 mLs by nebulization every 4 (four) hours as needed. 06/03/22   Chilton Greathouse, MD  Multiple Vitamin (MULTIVITAMIN WITH MINERALS) TABS tablet Take 1 tablet by mouth in the morning.    [provider]  pantoprazole (PROTONIX) 40 MG tablet Take 40 mg by mouth 2 (two) times daily. 08/29/17   [provider]  rosuvastatin (CRESTOR) 5 MG tablet Take 5 mg by mouth at bedtime. 11/18/20   [provider]  sacubitril-valsartan (ENTRESTO) 24-26 MG Take 1 tablet by mouth twice daily  03/14/23   Marinus Maw, MD  spironolactone (ALDACTONE) 25 MG tablet Take 1 tablet (25 mg total) by mouth daily. 08/18/22   Marinus Maw, MD                                                                                                                                    Allergies Ivp dye [iodinated contrast media], Penicillins, Sulfonamide derivatives, Tessalon [benzonatate], Sulfa antibiotics, Rosuvastatin calcium, and Onion  Review of Systems Review of Systems As noted in HPI  Physical Exam Vital Signs  I have reviewed the triage vital signs BP 132/89   Pulse 81   Temp (!) 97.3 F (36.3 C) (Oral)   Resp 13   Ht 5\' 4"  (1.626 m)   Wt 83.5 kg   SpO2 96%   BMI 31.58 kg/m   Physical Exam Vitals reviewed.  Constitutional:      General: She is not in acute distress.    Appearance: She is well-developed. She is not diaphoretic.  HENT:     Head: Normocephalic and atraumatic.     Nose: Nose normal.  Eyes:     General: No scleral icterus.        Right eye: No discharge.        Left eye: No discharge.     Conjunctiva/sclera: Conjunctivae normal.     Pupils: Pupils are equal, round, and reactive to light.  Cardiovascular:     Rate and Rhythm: Normal rate and regular rhythm.     Heart sounds: No murmur heard.    No friction rub. No gallop.  Pulmonary:     Effort: Pulmonary effort is normal. No respiratory distress.     Breath sounds: Normal breath sounds. No stridor. No rales.  Abdominal:     General: There is no distension.     Palpations: Abdomen is soft.     Tenderness: There is no abdominal tenderness.  Musculoskeletal:     Cervical back: Normal range of motion and neck supple.     Thoracic back: Spasms and tenderness present.       Back:  Skin:    General: Skin is warm and dry.     Findings: No erythema or rash.  Neurological:     Mental Status: She is alert and oriented to person, place, and time.     ED Results and Treatments Labs (all labs ordered are listed, but only abnormal results are displayed) Labs Reviewed  BASIC METABOLIC PANEL - Abnormal; Notable for the following components:      Result Value   Glucose, Bld 117 (*)    BUN 28 (*)    All other components within normal limits  URINALYSIS, ROUTINE W REFLEX MICROSCOPIC - Abnormal; Notable for the following components:   Leukocytes,Ua TRACE (*)    All other components within normal limits  RESP PANEL BY RT-PCR (RSV, FLU A&B, COVID)  [id number]  CBC  BRAIN NATRIURETIC PEPTIDE  TROPONIN I (HIGH SENSITIVITY)  TROPONIN I (HIGH SENSITIVITY)                                                                                                                         EKG  EKG Interpretation Date/Time:  Sunday June 18 2023 15:43:41 EDT Ventricular Rate:  87 PR Interval:  110 QRS Duration:  116 QT Interval:  414 QTC Calculation: 498 R Axis:   -4  Text Interpretation: v-paced Low voltage QRS Inferior infarct , age undetermined Cannot rule out Anterior infarct ,  age undetermined Abnormal ECG When compared with ECG of 03-Dec-2022 12:54, PREVIOUS ECG IS PRESENT Confirmed by Drema Pry [phone]) on 06/19/2023 2:00:58 AM       Radiology DG Chest 2 View Result Date: 06/18/2023 CLINICAL DATA:  Shortness of breath. EXAM: CHEST - 2 VIEW COMPARISON:  February 17, 2023. FINDINGS: The heart size and mediastinal contours are within normal limits. Both lungs are clear. Left-sided defibrillator is unchanged. Status post kyphoplasty at multiple levels of thoracic spine. IMPRESSION: No active cardiopulmonary disease. Electronically Signed   By: Lupita Raider M.D.   On: 06/18/2023 16:22    Medications Ordered in ED Medications  ondansetron (ZOFRAN-ODT) disintegrating tablet 4 mg (4 mg Oral Not Given 06/19/23 0115)  acetaminophen (TYLENOL) tablet 1,000 mg (1,000 mg Oral Given 06/19/23 0210)   Procedures Procedures  (including critical care time) Medical Decision Making / ED Course   Medical Decision Making Amount and/or Complexity of Data Reviewed Labs: ordered. Decision-making details documented in ED Course. Radiology: ordered and independent interpretation performed. Decision-making details documented in ED Course. ECG/medicine tests: ordered and independent interpretation performed. Decision-making details documented in ED Course.  Risk OTC drugs.    Upper back pain.  Differential diagnosis considered.  Workup below.  Favoring muscular etiology.    X-ray negative for obvious compression fracture of the upper/mid thoracic spine.  Chest x-ray negative for pneumonia, pneumothorax, pulmonary edema pleural effusions.  Presentation is not classic for dissection or esophageal perforation.  Low suspicion for PE.  CBC without leukocytosis or anemia.  Metabolic panel without significant electrolyte derangements or renal sufficiency.  EKG without acute ischemic changes.  Serial cardiac enzymes negative unlikely ACS.  Workup was not concerning for CHF  exacerbation.  Provided with oral Tylenol. Patient able to ambulate and maintain sats.    Final Clinical Impression(s) / ED Diagnoses Final diagnoses:  Acute upper back pain   The patient appears reasonably screened and/or stabilized for discharge and I doubt any other medical condition or other [facility] requiring further screening, evaluation, or treatment in the ED at this time. I have discussed the findings, Dx and Tx plan with the patient/family who expressed understanding and agree(s) with the plan. Discharge instructions discussed at length. The patient/family was given strict return precautions who verbalized understanding of the instructions. No further questions at time of discharge.  Disposition: Discharge  Condition: Good  ED Discharge Orders  Ordered    methocarbamol (ROBAXIN) 500 MG tablet  2 times daily        06/19/23 0229            Follow Up: Henrine Screws, MD [address] [place] [address] [phone]  Call  to schedule an appointment for close follow up     This chart was dictated using voice recognition software.  Despite best efforts to proofread,  errors can occur which can change the documentation meaning.    Nira Conn, MD 06/19/23 [phone]

## 2023-06-19 NOTE — Discharge Instructions (Signed)
 For pain control you may take 1000 mg of Tylenol every 8 hours as needed. Additionally, you may also use topical muscle creams such as SalonPas, [employer], Bengay, etc. Please stretch, apply ice or heat (whichever helps), and have massage therapy for additional assistance.

## 2023-06-19 NOTE — ED Notes (Signed)
 Pt ambulated in the hallway, O2 sat remained 97-98% on RA.

## 2023-06-20 DIAGNOSIS — M7061 Trochanteric bursitis, right hip: Secondary | ICD-10-CM | POA: Diagnosis not present

## 2023-06-20 DIAGNOSIS — M1711 Unilateral primary osteoarthritis, right knee: Secondary | ICD-10-CM | POA: Diagnosis not present

## 2023-06-23 DIAGNOSIS — M546 Pain in thoracic spine: Secondary | ICD-10-CM | POA: Diagnosis not present

## 2023-06-23 DIAGNOSIS — K219 Gastro-esophageal reflux disease without esophagitis: Secondary | ICD-10-CM | POA: Diagnosis not present

## 2023-06-23 NOTE — Progress Notes (Signed)
 Remote ICD transmission.

## 2023-06-23 NOTE — Addendum Note (Signed)
 Addended by: Elease Etienne A on: 06/23/2023 11:36 AM   Modules accepted: Orders

## 2023-07-08 ENCOUNTER — Other Ambulatory Visit: Payer: Self-pay | Admitting: Pulmonary Disease

## 2023-07-10 ENCOUNTER — Ambulatory Visit: Payer: Medicare HMO | Attending: Internal Medicine

## 2023-07-10 DIAGNOSIS — Z95810 Presence of automatic (implantable) cardiac defibrillator: Secondary | ICD-10-CM

## 2023-07-10 DIAGNOSIS — I5022 Chronic systolic (congestive) heart failure: Secondary | ICD-10-CM

## 2023-07-11 NOTE — Telephone Encounter (Signed)
 Courtesy refill. Pt needs appointment for further refills

## 2023-07-11 NOTE — Progress Notes (Signed)
 EPIC Encounter for ICM Monitoring  Patient Name: Paula Booker is a 78 y.o. female Date: 07/11/2023 Primary Care Physican: Henrine Screws, MD Primary Cardiologist: Ladona Ridgel Electrophysiologist: Ladona Ridgel BiV Pacing: 92.5% 08/01/2022 Office Weight: 191 lbs 09/07/2022 Weight: 191 lbs 01/27/2023 Weight: unknown 05/04/2023 Weight: 189 lbs 06/07/2023 Weight: 186 lbs 07/11/2023 Weight: 180 lbs   Time in AT/AF  0.0 hours/day (0.0 %)          Spoke with patient and heart failure questions reviewed.  Transmission results reviewed.  Pt reports she is feeling okay but does not a lot of energy.   Optivol thoracic impedance suggesting possible fluid accumulation starting 3/28.     Prescribed:  Furosemide 40 mg take 1-2 tablets (40 mg - 80 mg total) by mouth as directed once daily for edema, SOB or weight gain.   Pt reports on 07/11/23 she normally takes 1 tablet daily and only takes 2nd one if needed for fluid symptoms. Spironolactone 25 mg take 1 tablet by mouth daily   Labs: 12/03/2022 Creatinine 0.80, BUN 30, Potassium 4.3, Sodium 137 05/20/2022 Creatinine 0.90, BUN 19, Potassium 4.1, Sodium 135, GFR >60  05/19/2022 Creatinine 0.94, BUN 19, Potassium 3.7, Sodium 139, GFR >60  A complete set of results can be found in Results Review.   Recommendations:  She will take Lasix 2 tablets x 1 day.  Encouraged her to call back for any changes.    Follow-up plan: ICM clinic phone appointment on 08/17/2023.   91 day device clinic remote transmission 08/16/2023.     EP/Cardiology Office Visits:  08/22/2023 with Dr Ladona Ridgel.    Recall 07/27/2023 with Dr Eldridge Dace.     Copy of ICM check sent to Dr. Ladona Ridgel.   3 month ICM trend: 07/10/2023.    12-14 Month ICM trend:     Karie Soda, RN 07/11/2023 4:29 PM

## 2023-07-19 DIAGNOSIS — R6883 Chills (without fever): Secondary | ICD-10-CM | POA: Diagnosis not present

## 2023-07-19 DIAGNOSIS — Z8709 Personal history of other diseases of the respiratory system: Secondary | ICD-10-CM | POA: Diagnosis not present

## 2023-07-19 DIAGNOSIS — R051 Acute cough: Secondary | ICD-10-CM | POA: Diagnosis not present

## 2023-07-19 DIAGNOSIS — J209 Acute bronchitis, unspecified: Secondary | ICD-10-CM | POA: Diagnosis not present

## 2023-07-25 DIAGNOSIS — M1712 Unilateral primary osteoarthritis, left knee: Secondary | ICD-10-CM | POA: Diagnosis not present

## 2023-08-01 DIAGNOSIS — J45909 Unspecified asthma, uncomplicated: Secondary | ICD-10-CM | POA: Diagnosis not present

## 2023-08-01 DIAGNOSIS — I480 Paroxysmal atrial fibrillation: Secondary | ICD-10-CM | POA: Diagnosis not present

## 2023-08-01 DIAGNOSIS — G4733 Obstructive sleep apnea (adult) (pediatric): Secondary | ICD-10-CM | POA: Diagnosis not present

## 2023-08-01 DIAGNOSIS — I5022 Chronic systolic (congestive) heart failure: Secondary | ICD-10-CM | POA: Diagnosis not present

## 2023-08-01 DIAGNOSIS — E669 Obesity, unspecified: Secondary | ICD-10-CM | POA: Diagnosis not present

## 2023-08-13 DIAGNOSIS — J22 Unspecified acute lower respiratory infection: Secondary | ICD-10-CM | POA: Diagnosis not present

## 2023-08-16 ENCOUNTER — Ambulatory Visit (INDEPENDENT_AMBULATORY_CARE_PROVIDER_SITE_OTHER): Payer: Medicare HMO

## 2023-08-16 ENCOUNTER — Encounter: Payer: Self-pay | Admitting: Internal Medicine

## 2023-08-16 DIAGNOSIS — I428 Other cardiomyopathies: Secondary | ICD-10-CM

## 2023-08-16 LAB — CUP PACEART REMOTE DEVICE CHECK
Battery Remaining Longevity: 121 mo
Battery Voltage: 3 V
Brady Statistic RV Percent Paced: 6.73 %
Date Time Interrogation Session: 20250506221821
HighPow Impedance: 78 Ohm
Implantable Lead Connection Status: 753985
Implantable Lead Connection Status: 753985
Implantable Lead Connection Status: 753985
Implantable Lead Implant Date: 20131212
Implantable Lead Implant Date: 20131212
Implantable Lead Implant Date: 20240205
Implantable Lead Location: 753858
Implantable Lead Location: 753859
Implantable Lead Location: 753860
Implantable Lead Model: 4598
Implantable Lead Model: 5076
Implantable Lead Model: 6935
Implantable Pulse Generator Implant Date: 20240205
Lead Channel Impedance Value: 1007 Ohm
Lead Channel Impedance Value: 1026 Ohm
Lead Channel Impedance Value: 1064 Ohm
Lead Channel Impedance Value: 1178 Ohm
Lead Channel Impedance Value: 361 Ohm
Lead Channel Impedance Value: 399 Ohm
Lead Channel Impedance Value: 475 Ohm
Lead Channel Impedance Value: 589 Ohm
Lead Channel Impedance Value: 665 Ohm
Lead Channel Impedance Value: 703 Ohm
Lead Channel Impedance Value: 741 Ohm
Lead Channel Impedance Value: 817 Ohm
Lead Channel Impedance Value: 988 Ohm
Lead Channel Pacing Threshold Amplitude: 0.375 V
Lead Channel Pacing Threshold Amplitude: 0.75 V
Lead Channel Pacing Threshold Amplitude: 1.25 V
Lead Channel Pacing Threshold Pulse Width: 0.4 ms
Lead Channel Pacing Threshold Pulse Width: 0.4 ms
Lead Channel Pacing Threshold Pulse Width: 0.4 ms
Lead Channel Sensing Intrinsic Amplitude: 16.4 mV
Lead Channel Sensing Intrinsic Amplitude: 2.9 mV
Lead Channel Setting Pacing Amplitude: 2 V
Lead Channel Setting Pacing Amplitude: 2 V
Lead Channel Setting Pacing Amplitude: 2.5 V
Lead Channel Setting Pacing Pulse Width: 0.4 ms
Lead Channel Setting Pacing Pulse Width: 0.4 ms
Lead Channel Setting Sensing Sensitivity: 0.3 mV
Zone Setting Status: 755011
Zone Setting Status: 755011

## 2023-08-17 ENCOUNTER — Ambulatory Visit: Attending: Internal Medicine

## 2023-08-17 DIAGNOSIS — I5022 Chronic systolic (congestive) heart failure: Secondary | ICD-10-CM

## 2023-08-17 DIAGNOSIS — Z95810 Presence of automatic (implantable) cardiac defibrillator: Secondary | ICD-10-CM

## 2023-08-17 NOTE — Progress Notes (Signed)
 EPIC Encounter for ICM Monitoring  Patient Name: Paula Booker is a 78 y.o. female Date: 08/17/2023 Primary Care Physican: Ruven Coy, MD Primary Cardiologist: Carolynne Citron Electrophysiologist: Carolynne Citron BiV Pacing: 92.6% 08/01/2022 Office Weight: 191 lbs 09/07/2022 Weight: 191 lbs 01/27/2023 Weight: unknown 05/04/2023 Weight: 189 lbs 06/07/2023 Weight: 186 lbs 07/11/2023 Weight: 180 lbs 08/16/2023 Weight: 172 lbs (loss of 40 pounds over the past 2 years)   Time in AT/AF  <0.1 hours/day (<0.1 %)          Spoke with patient and heart failure questions reviewed.  Transmission results reviewed.  Pt asymptomatic for fluid accumulation.  She's had bronchitis in the past month and taking prednisone .   Grandson getting married 6/8.     Optivol thoracic impedance suggesting possible fluid accumulation from 3/28-4/7, 4/12-4/20 and 4/25-5/4.   She is drinking more than 64 oz fluid daily.   Prescribed:  Furosemide  40 mg take 1-2 tablets (40 mg - 80 mg total) by mouth as directed once daily for edema, SOB or weight gain.   Pt reports on 07/11/23 she normally takes 1 tablet daily and only takes 2nd one if needed for fluid symptoms. Spironolactone  25 mg take 1 tablet by mouth daily   Labs: 06/18/2023 Creatinine 0.91, BUN 28, Potassium 3.7, Sodium 138, GFR >60 12/03/2022 Creatinine 0.80, BUN 30, Potassium 4.3, Sodium 137 05/20/2022 Creatinine 0.90, BUN 19, Potassium 4.1, Sodium 135, GFR >60  05/19/2022 Creatinine 0.94, BUN 19, Potassium 3.7, Sodium 139, GFR >60  A complete set of results can be found in Results Review.   Recommendations:  Recommendation to limit fluid intake to 64 oz daily.  Encouraged to call if experiencing any fluid symptoms.    Follow-up plan: ICM clinic phone appointment on 09/18/2023.   91 day device clinic remote transmission 11/15/2023.     EP/Cardiology Office Visits:  08/22/2023 with Dr Carolynne Citron.    Recall 07/27/2023 with Dr Jacquelynn Matter.     Copy of ICM check sent to Dr. Carolynne Citron.   3 month  ICM trend: 08/15/2023.    12-14 Month ICM trend:     Almyra Jain, RN 08/17/2023 3:32 PM

## 2023-08-22 ENCOUNTER — Encounter: Payer: Self-pay | Admitting: Internal Medicine

## 2023-08-22 ENCOUNTER — Ambulatory Visit: Payer: Medicare HMO | Attending: Internal Medicine | Admitting: Internal Medicine

## 2023-08-22 VITALS — BP 108/60 | HR 69 | Ht 63.0 in | Wt 182.0 lb

## 2023-08-22 DIAGNOSIS — I5022 Chronic systolic (congestive) heart failure: Secondary | ICD-10-CM | POA: Diagnosis not present

## 2023-08-22 LAB — CUP PACEART INCLINIC DEVICE CHECK
Date Time Interrogation Session: 20250513141857
Implantable Lead Connection Status: 753985
Implantable Lead Connection Status: 753985
Implantable Lead Connection Status: 753985
Implantable Lead Implant Date: 20131212
Implantable Lead Implant Date: 20131212
Implantable Lead Implant Date: 20240205
Implantable Lead Location: 753858
Implantable Lead Location: 753859
Implantable Lead Location: 753860
Implantable Lead Model: 4598
Implantable Lead Model: 5076
Implantable Lead Model: 6935
Implantable Pulse Generator Implant Date: 20240205

## 2023-08-22 NOTE — Patient Instructions (Signed)

## 2023-08-22 NOTE — Progress Notes (Signed)
 HPI Mrs. Forbus returns today for ongoing evaluation. She has a h/o ICM, chronic systolic heart failure, syncope, and a WPW pattern on her ECG. She underwent EP study which demonstrated no inducible arrhythmias. She underwent ICD insertion. She has done well in the interim. She denies ICD shock and has lost about 20 lbs and she hopes to lose another 40.  Allergies  Allergen Reactions   Ivp Dye [Iodinated Contrast Media] Hives and Other (See Comments)    pt tolerates with benadryl    Penicillins Anaphylaxis    Has patient had a PCN reaction causing immediate rash, facial/tongue/throat swelling, SOB or lightheadedness with hypotension: Yes Has patient had a PCN reaction causing severe rash involving mucus membranes or skin necrosis: No Has patient had a PCN reaction that required hospitalization No Has patient had a PCN reaction occurring within the last 10 years: No If all of the above answers are "NO", then may proceed with Cephalosporin use.   Sulfonamide Derivatives Other (See Comments)    Migraines   Tessalon [Benzonatate] Hives and Rash    Severe rash   Sulfa Antibiotics Other (See Comments)    Migraines   Rosuvastatin  Calcium  Itching   Onion Other (See Comments)    Raw onions cause migraines, patient can eat cooked onions      Current Outpatient Medications  Medication Sig Dispense Refill   acetaminophen  (TYLENOL ) 500 MG tablet Take 500 mg by mouth in the morning and at bedtime.     albuterol  (VENTOLIN  HFA) 108 (90 Base) MCG/ACT inhaler INHALE 2 PUFFS BY MOUTH EVERY 6 HOURS AS NEEDED FOR WHEEZING OR SHORTNESS OF BREATH 9 g 0   apixaban  (ELIQUIS ) 5 MG TABS tablet Take 1 tablet by mouth twice daily 180 tablet 1   Calcium  Carb-Cholecalciferol  600-20 MG-MCG TABS Take 1 tablet by mouth in the morning.     carvedilol  (COREG ) 6.25 MG tablet Take 1 tablet by mouth twice daily 180 tablet 2   cetirizine (ZYRTEC) 10 MG tablet Take 10 mg by mouth in the morning.     Cholecalciferol   (VITAMIN D3) 5000 units CAPS Take 5,000 Units by mouth in the morning.     Collagen-Vitamin C (COLLAGEN PLUS VITAMIN C PO) Take 1 tablet by mouth in the morning.     Cyanocobalamin  (VITAMIN B-12) 5000 MCG LOZG Take 5,000 mcg by mouth in the morning.     escitalopram  (LEXAPRO ) 20 MG tablet Take 20 mg by mouth in the morning.     fluticasone  furoate-vilanterol (BREO ELLIPTA ) 200-25 MCG/ACT AEPB Inhale 1 puff by mouth once daily 60 each 11   furosemide  (LASIX ) 40 MG tablet TAKE 1 TO 2 TABLETS BY MOUTH AS DIRECTED ONCE DAILY FOR  EDEMA,  SHORTNESS  OF  BREATH  OR  WEIGHT  GAIN. 180 tablet 2   ipratropium-albuterol  (DUONEB) 0.5-2.5 (3) MG/3ML SOLN Take 3 mLs by nebulization every 4 (four) hours as needed. 360 mL 11   methocarbamol  (ROBAXIN ) 500 MG tablet Take 1 tablet (500 mg total) by mouth 2 (two) times daily. 20 tablet 0   MOUNJARO 2.5 MG/0.5ML Pen Inject 2.5 mg into the skin once a week.     Multiple Vitamin (MULTIVITAMIN WITH MINERALS) TABS tablet Take 1 tablet by mouth in the morning.     pantoprazole  (PROTONIX ) 40 MG tablet Take 40 mg by mouth 2 (two) times daily.  0   rosuvastatin  (CRESTOR ) 5 MG tablet Take 5 mg by mouth at bedtime.     sacubitril-valsartan (  ENTRESTO ) 24-26 MG Take 1 tablet by mouth twice daily 180 tablet 1   spironolactone  (ALDACTONE ) 25 MG tablet Take 1 tablet (25 mg total) by mouth daily. 90 tablet 3   No current facility-administered medications for this visit.     Past Medical History:  Diagnosis Date   AICD (automatic cardioverter/defibrillator) present    Anemia    as a child   Anxiety    Arthritis    "hands and knees" (03/22/2012)   Asthma    Basal cell carcinoma 04/27/2011   mid forehead(MOHS)   Bursitis    CHF (congestive heart failure) (HCC) 09/2011   pt. denies   Depression    Dysrhythmia    WPW   GERD (gastroesophageal reflux disease)    Head injury, acute, with loss of consciousness (HCC)    History of hiatal hernia    Hypertension    ICD  (implantable cardiac defibrillator) in place 03/2012   Migraines    migraines    Nausea vomiting and diarrhea 03/18/2015   NICM (nonischemic cardiomyopathy) (HCC)    Obesity (BMI 30-39.9)    negative sleep study 2014   OSA (obstructive sleep apnea) 05/27/2018   Mild with AHI 6/hr    no cpap   Pneumonia    hx   Shortness of breath    "@ any time I can get SOB" (03/22/2012)   Skin cancer 1999   Squamous cell carcinoma of skin 07/08/2020   in situ right malar cheek-[id number]   Sudden arrhythmia death syndrome    WPW (Wolff-Parkinson-White syndrome)     ROS:   All systems reviewed and negative except as noted in the HPI.   Past Surgical History:  Procedure Laterality Date   BIOPSY  09/07/2021   Procedure: BIOPSY;  Surgeon: Felecia Hopper, MD;  Location: [facility];  Service: Gastroenterology;;   Milon Aloe N/A 05/16/2022   Procedure: BIV UPGRADE;  Surgeon: Tammie Fall, MD;  Location: [facility];  Service: Cardiovascular;  Laterality: N/A;   CARDIAC CATHETERIZATION  09/18/11   no significant CAD   CARDIAC DEFIBRILLATOR PLACEMENT  03/22/2012   DDD   CHOLECYSTECTOMY  ~ 2003   ESOPHAGOGASTRODUODENOSCOPY (EGD) WITH PROPOFOL  N/A 09/07/2021   Procedure: ESOPHAGOGASTRODUODENOSCOPY (EGD) WITH PROPOFOL ;  Surgeon: Felecia Hopper, MD;  Location: [facility];  Service: Gastroenterology;  Laterality: N/A;   IMPLANTABLE CARDIOVERTER DEFIBRILLATOR IMPLANT N/A 03/22/2012   Procedure: IMPLANTABLE CARDIOVERTER DEFIBRILLATOR IMPLANT;  Surgeon: Tammie Fall, MD;  Location: [facility] CATH LAB;  Service: Cardiovascular;  Laterality: N/A;   KNEE ARTHROSCOPY Left 11/29/2017   Procedure: ARTHROSCOPY LEFT KNEE;  Surgeon: Neil Balls, MD;  Location: [facility] ORS;  Service: Orthopedics;  Laterality: Left;   KNEE ARTHROSCOPY Right 08/09/2019   Procedure: ARTHROSCOPY RIGHT KNEE, CHONDROPLASTY,  PARTIAL MEDIAL  MENSICECTOMY;  Surgeon: Neil Balls, MD;  Location: [facility] ORS;  Service: Orthopedics;  Laterality:  Right;   KYPHOPLASTY N/A 11/27/2013   Procedure: KYPHOPLASTY;  Surgeon: Estevan Helper, MD;  Location: [facility] OR;  Service: Orthopedics;  Laterality: N/A;  T9, T11 kyphoplasty   KYPHOPLASTY N/A 06/05/2014   Procedure: KYPHOPLASTY;  Surgeon: Estevan Helper, MD;  Location: [facility] OR;  Service: Orthopedics;  Laterality: N/A;  T7 kyphoplasty   MOHS SURGERY  06/2011   "forehead" (03/22/2012)   SKIN CANCER EXCISION  1999   "top of my head and my right back shoulder"   SKIN CANCER EXCISION     back   SKIN CANCER EXCISION     face  SUPRAVENTRICULAR TACHYCARDIA ABLATION  03/22/2012   unable to induce VT/RN report 03/22/2012   SUPRAVENTRICULAR TACHYCARDIA ABLATION N/A 03/22/2012   Procedure: SUPRAVENTRICULAR TACHYCARDIA ABLATION;  Surgeon: Tammie Fall, MD;  Location: [facility] CATH LAB;  Service: Cardiovascular;  Laterality: N/A;   TONSILLECTOMY AND ADENOIDECTOMY  1950   TUBAL LIGATION  1978     Family History  Problem Relation Age of Onset   Congestive Heart Failure Mother        died 64   Kidney Stones Mother    Deafness Mother    Atrial fibrillation Mother    Congestive Heart Failure Father    Deafness Father    Healthy Sister    Healthy Sister    Healthy Sister    Congestive Heart Failure Brother    Atrial fibrillation Brother    Tuberculosis Paternal Grandfather    Migraines Neg Hx    Neural tube defect Neg Hx      Social History   Socioeconomic History   Marital status: Divorced    Spouse name: Not on file   Number of children: 3   Years of education: college   Highest education level: Not on file  Occupational History   Occupation: retired    Comment: Airline pilot  Tobacco Use   Smoking status: Never   Smokeless tobacco: Never  Vaping Use   Vaping status: Never Used  Substance and Sexual Activity   Alcohol use: Not Currently    Comment: rare   Drug use: No   Sexual activity: Not Currently  Other Topics Concern   Not on file  Social History Narrative    Divorced, 3 children, 9 grandchildren. Did work for [employer]. Retired at 78 y/o .   Social Drivers of Corporate investment banker Strain: Not on file  Food Insecurity: No Food Insecurity (05/19/2022)   Hunger Vital Sign    Worried About Running Out of Food in the Last Year: Never true    Ran Out of Food in the Last Year: Never true  Transportation Needs: No Transportation Needs (05/19/2022)   PRAPARE - Administrator, Civil Service (Medical): No    Lack of Transportation (Non-Medical): No  Physical Activity: Not on file  Stress: Not on file  Social Connections: Unknown (04/21/2022)   Received from [facility], [facility]   Social Network    Social Network: Not on file  Intimate Partner Violence: Not At Risk (05/19/2022)   Humiliation, Afraid, Rape, and Kick questionnaire    Fear of Current or Ex-Partner: No    Emotionally Abused: No    Physically Abused: No    Sexually Abused: No     BP 108/60   Pulse 69   Ht 5\' 3"  (1.6 m)   Wt 182 lb (82.6 kg)   SpO2 98%   BMI 32.24 kg/m   Physical Exam:  Well appearing NAD HEENT: Unremarkable Neck:  No JVD, no thyromegally Lymphatics:  No adenopathy Back:  No CVA tenderness Lungs:  Clear with no wheezes HEART:  Regular rate rhythm, no murmurs, no rubs, no clicks Abd:  soft, positive bowel sounds, no organomegally, no rebound, no guarding Ext:  2 plus pulses, no edema, no cyanosis, no clubbing Skin:  No rashes no nodules Neuro:  CN II through XII intact, motor grossly intact  DEVICE  Normal device function.  See PaceArt for details.   Assess/Plan: Chronic systolic heart failure - her symptoms remain class 2. Previously she was limited by arthritis  but this is improved. ICD - her Medtronic Biv ICD is working normally. We will recheck in several months. PAF - she is maintaining NSR. Continue systemic anti-coag.  Pete Brand Amesha Bailey,MD

## 2023-09-07 ENCOUNTER — Other Ambulatory Visit: Payer: Self-pay | Admitting: Internal Medicine

## 2023-09-07 DIAGNOSIS — I480 Paroxysmal atrial fibrillation: Secondary | ICD-10-CM

## 2023-09-07 NOTE — Telephone Encounter (Signed)
 Prescription refill request for Eliquis  received. Indication: PAF Last office visit: 08/22/23  Alesia Husky MD Scr: 0.91 on 06/18/23  Epic Age: 78 Weight: 82.6kg  Based on above findings Eliquis  5mg  twice daily is the appropriate dose.  Refill approved.

## 2023-09-18 ENCOUNTER — Ambulatory Visit: Attending: Internal Medicine

## 2023-09-18 DIAGNOSIS — Z95810 Presence of automatic (implantable) cardiac defibrillator: Secondary | ICD-10-CM

## 2023-09-18 DIAGNOSIS — I5022 Chronic systolic (congestive) heart failure: Secondary | ICD-10-CM

## 2023-09-20 NOTE — Progress Notes (Signed)
 EPIC Encounter for ICM Monitoring  Patient Name: Paula Booker is a 78 y.o. female Date: 09/20/2023 Primary Care Physican: Ruven Coy, MD Primary Cardiologist: Carolynne Citron Electrophysiologist: Carolynne Citron BiV Pacing: 93.9% 08/01/2022 Office Weight: 191 lbs 09/07/2022 Weight: 191 lbs 01/27/2023 Weight: unknown 05/04/2023 Weight: 189 lbs 06/07/2023 Weight: 186 lbs 07/11/2023 Weight: 180 lbs 08/16/2023 Weight: 172 lbs (loss of 40 pounds over the past 2 years) 09/20/2023 Weight: 177 - 180 lbs   Time in AT/AF  0.0 hours/day (0.0 %)          Spoke with patient and heart failure questions reviewed.  Transmission results reviewed.  Pt had swelling in her feet this weekend due her grandsons wedding.      Optivol thoracic impedance suggesting possible fluid accumulation from 5/23 and returned to baseline 6/9.   Also suggesting possible fluid accumulation from 5/10-5/18.   Prescribed:  Furosemide  40 mg take 1-2 tablets (40 mg - 80 mg total) by mouth as directed once daily for edema, SOB or weight gain.   Pt reports on 07/11/23 she normally takes 1 tablet daily and only takes 2nd one if needed for fluid symptoms. Spironolactone  25 mg take 1 tablet by mouth daily   Labs: 06/18/2023 Creatinine 0.91, BUN 28, Potassium 3.7, Sodium 138, GFR >60 12/03/2022 Creatinine 0.80, BUN 30, Potassium 4.3, Sodium 137 05/20/2022 Creatinine 0.90, BUN 19, Potassium 4.1, Sodium 135, GFR >60  05/19/2022 Creatinine 0.94, BUN 19, Potassium 3.7, Sodium 139, GFR >60  A complete set of results can be found in Results Review.   Recommendations:  Recommendation to limit fluid intake to 64 oz daily.  Encouraged to call if experiencing any fluid symptoms.    Follow-up plan: ICM clinic phone appointment on 11/06/2023.   91 day device clinic remote transmission 11/15/2023.     EP/Cardiology Office Visits:  Recall [id number]/82026 with Dr Carolynne Citron.    Recall 07/27/2023 with Dr Jacquelynn Matter.     Copy of ICM check sent to Dr. Carolynne Citron.   3 month ICM trend:  09/18/2023.    12-14 Month ICM trend:     Almyra Jain, RN 09/20/2023 2:33 PM

## 2023-09-26 NOTE — Progress Notes (Signed)
 Remote ICD transmission.

## 2023-10-07 ENCOUNTER — Encounter (HOSPITAL_BASED_OUTPATIENT_CLINIC_OR_DEPARTMENT_OTHER): Payer: Self-pay

## 2023-10-07 ENCOUNTER — Other Ambulatory Visit: Payer: Self-pay

## 2023-10-07 ENCOUNTER — Observation Stay (HOSPITAL_BASED_OUTPATIENT_CLINIC_OR_DEPARTMENT_OTHER)
Admission: EM | Admit: 2023-10-07 | Discharge: 2023-10-10 | Disposition: A | Discharge status: Home / Self Care | Attending: Internal Medicine | Admitting: Internal Medicine

## 2023-10-07 DIAGNOSIS — K219 Gastro-esophageal reflux disease without esophagitis: Secondary | ICD-10-CM | POA: Diagnosis not present

## 2023-10-07 DIAGNOSIS — E861 Hypovolemia: Secondary | ICD-10-CM | POA: Diagnosis not present

## 2023-10-07 DIAGNOSIS — Z743 Need for continuous supervision: Secondary | ICD-10-CM | POA: Diagnosis not present

## 2023-10-07 DIAGNOSIS — I480 Paroxysmal atrial fibrillation: Secondary | ICD-10-CM | POA: Insufficient documentation

## 2023-10-07 DIAGNOSIS — R197 Diarrhea, unspecified: Secondary | ICD-10-CM

## 2023-10-07 DIAGNOSIS — I959 Hypotension, unspecified: Secondary | ICD-10-CM | POA: Insufficient documentation

## 2023-10-07 DIAGNOSIS — E876 Hypokalemia: Secondary | ICD-10-CM | POA: Diagnosis not present

## 2023-10-07 DIAGNOSIS — J45909 Unspecified asthma, uncomplicated: Secondary | ICD-10-CM | POA: Insufficient documentation

## 2023-10-07 DIAGNOSIS — F419 Anxiety disorder, unspecified: Secondary | ICD-10-CM | POA: Diagnosis not present

## 2023-10-07 DIAGNOSIS — I110 Hypertensive heart disease with heart failure: Secondary | ICD-10-CM | POA: Diagnosis not present

## 2023-10-07 DIAGNOSIS — I428 Other cardiomyopathies: Secondary | ICD-10-CM | POA: Insufficient documentation

## 2023-10-07 DIAGNOSIS — F32A Depression, unspecified: Secondary | ICD-10-CM | POA: Insufficient documentation

## 2023-10-07 DIAGNOSIS — E860 Dehydration: Secondary | ICD-10-CM | POA: Insufficient documentation

## 2023-10-07 DIAGNOSIS — I951 Orthostatic hypotension: Secondary | ICD-10-CM | POA: Diagnosis not present

## 2023-10-07 DIAGNOSIS — R42 Dizziness and giddiness: Principal | ICD-10-CM | POA: Insufficient documentation

## 2023-10-07 DIAGNOSIS — I5022 Chronic systolic (congestive) heart failure: Secondary | ICD-10-CM | POA: Diagnosis not present

## 2023-10-07 DIAGNOSIS — Z8674 Personal history of sudden cardiac arrest: Secondary | ICD-10-CM | POA: Insufficient documentation

## 2023-10-07 DIAGNOSIS — E785 Hyperlipidemia, unspecified: Secondary | ICD-10-CM | POA: Insufficient documentation

## 2023-10-07 DIAGNOSIS — R531 Weakness: Secondary | ICD-10-CM | POA: Diagnosis not present

## 2023-10-07 LAB — COMPREHENSIVE METABOLIC PANEL WITH GFR
ALT: 8 U/L (ref 0–44)
AST: 19 U/L (ref 15–41)
Albumin: 4.1 g/dL (ref 3.5–5.0)
Alkaline Phosphatase: 88 U/L (ref 38–126)
Anion gap: 11 (ref 5–15)
BUN: 22 mg/dL (ref 8–23)
CO2: 23 mmol/L (ref 22–32)
Calcium: 9.6 mg/dL (ref 8.9–10.3)
Chloride: 103 mmol/L (ref 98–111)
Creatinine, Ser: 0.78 mg/dL (ref 0.44–1.00)
GFR, Estimated: >60 mL/min (ref >60)
Glucose, Bld: 109 mg/dL — ABNORMAL HIGH (ref 70–99)
Potassium: 3.5 mmol/L (ref 3.5–5.1)
Sodium: 137 mmol/L (ref 135–145)
Total Bilirubin: 0.5 mg/dL (ref 0.0–1.2)
Total Protein: 7 g/dL (ref 6.5–8.1)

## 2023-10-07 LAB — URINALYSIS, ROUTINE W REFLEX MICROSCOPIC
Bilirubin Urine: NEGATIVE
Glucose, UA: NEGATIVE mg/dL
Ketones, ur: NEGATIVE mg/dL
Leukocytes,Ua: NEGATIVE
Nitrite: NEGATIVE
Protein, ur: NEGATIVE mg/dL
Specific Gravity, Urine: <1.005 — ABNORMAL LOW (ref 1.005–1.030)
pH: 5.5 (ref 5.0–8.0)

## 2023-10-07 LAB — CBC
HCT: 43.5 % (ref 36.0–46.0)
Hemoglobin: 15 g/dL (ref 12.0–15.0)
MCH: 31.5 pg (ref 26.0–34.0)
MCHC: 34.5 g/dL (ref 30.0–36.0)
MCV: 91.4 fL (ref 80.0–100.0)
Platelets: 261 10*3/uL (ref 150–400)
RBC: 4.76 MIL/uL (ref 3.87–5.11)
RDW: 12.7 % (ref 11.5–15.5)
WBC: 10.4 10*3/uL (ref 4.0–10.5)
nRBC: 0 % (ref 0.0–0.2)

## 2023-10-07 LAB — MAGNESIUM: Magnesium: 2 mg/dL (ref 1.7–2.4)

## 2023-10-07 LAB — LIPASE, BLOOD: Lipase: 38 U/L (ref 11–51)

## 2023-10-07 MED ORDER — CALCIUM CARB-CHOLECALCIFEROL 600-20 MG-MCG PO TABS: 1.0000 | ORAL_TABLET | Freq: Every morning | ORAL | Status: DC

## 2023-10-07 MED ORDER — ADULT MULTIVITAMIN W/MINERALS CH
1.0000 | ORAL_TABLET | Freq: Every day | ORAL | Status: DC
  Administered 2023-10-08 – 2023-10-10 (×3): 1 via ORAL
  Filled 2023-10-07 (×3): qty 1

## 2023-10-07 MED ORDER — PANTOPRAZOLE SODIUM 40 MG PO TBEC
40.0000 mg | DELAYED_RELEASE_TABLET | Freq: Two times a day (BID) | ORAL | Status: DC
  Administered 2023-10-07 – 2023-10-10 (×6): 40 mg via ORAL
  Filled 2023-10-07 (×6): qty 1

## 2023-10-07 MED ORDER — LORATADINE 10 MG PO TABS
10.0000 mg | ORAL_TABLET | Freq: Every day | ORAL | Status: DC
  Administered 2023-10-08 – 2023-10-10 (×3): 10 mg via ORAL
  Filled 2023-10-07 (×3): qty 1

## 2023-10-07 MED ORDER — IPRATROPIUM-ALBUTEROL 0.5-2.5 (3) MG/3ML IN SOLN: 3.0000 mL | RESPIRATORY_TRACT | Status: DC | PRN

## 2023-10-07 MED ORDER — COLLAGEN-VITAMIN C 740-125 MG PO CAPS: ORAL_CAPSULE | Freq: Every morning | ORAL | Status: DC

## 2023-10-07 MED ORDER — ACETAMINOPHEN 500 MG PO TABS
500.0000 mg | ORAL_TABLET | Freq: Two times a day (BID) | ORAL | Status: DC
  Administered 2023-10-07 – 2023-10-10 (×6): 500 mg via ORAL
  Filled 2023-10-07 (×6): qty 1

## 2023-10-07 MED ORDER — ONDANSETRON HCL 4 MG PO TABS: 4.0000 mg | ORAL_TABLET | Freq: Four times a day (QID) | ORAL | Status: DC | PRN

## 2023-10-07 MED ORDER — OYSTER SHELL CALCIUM/D3 500-5 MG-MCG PO TABS
1.0000 | ORAL_TABLET | Freq: Every day | ORAL | Status: DC
  Administered 2023-10-08 – 2023-10-10 (×3): 1 via ORAL
  Filled 2023-10-07 (×3): qty 1

## 2023-10-07 MED ORDER — ACETAMINOPHEN 325 MG PO TABS
650.0000 mg | ORAL_TABLET | Freq: Four times a day (QID) | ORAL | Status: DC | PRN
Start: 2023-10-07 — End: 2023-10-10

## 2023-10-07 MED ORDER — VITAMIN B-12 1000 MCG PO TABS
5000.0000 ug | ORAL_TABLET | Freq: Every day | ORAL | Status: DC
  Administered 2023-10-08 – 2023-10-09 (×2): 5000 ug via ORAL
  Filled 2023-10-07 (×3): qty 5

## 2023-10-07 MED ORDER — VITAMIN D 25 MCG (1000 UNIT) PO TABS
5000.0000 [IU] | ORAL_TABLET | Freq: Every day | ORAL | Status: DC
  Administered 2023-10-08 – 2023-10-10 (×3): 5000 [IU] via ORAL
  Filled 2023-10-07 (×3): qty 5

## 2023-10-07 MED ORDER — FLUTICASONE FUROATE-VILANTEROL 200-25 MCG/ACT IN AEPB
1.0000 | INHALATION_SPRAY | Freq: Every day | RESPIRATORY_TRACT | Status: DC
  Administered 2023-10-08 – 2023-10-10 (×3): 1 via RESPIRATORY_TRACT
  Filled 2023-10-07: qty 28

## 2023-10-07 MED ORDER — ACETAMINOPHEN 650 MG RE SUPP
650.0000 mg | Freq: Four times a day (QID) | RECTAL | Status: DC | PRN
Start: 2023-10-07 — End: 2023-10-10

## 2023-10-07 MED ORDER — LOPERAMIDE HCL 2 MG PO CAPS: 4.0000 mg | ORAL_CAPSULE | Freq: Two times a day (BID) | ORAL | Status: DC | PRN

## 2023-10-07 MED ORDER — ESCITALOPRAM OXALATE 20 MG PO TABS
20.0000 mg | ORAL_TABLET | Freq: Every day | ORAL | Status: DC
  Administered 2023-10-08 – 2023-10-10 (×3): 20 mg via ORAL
  Filled 2023-10-07 (×3): qty 1

## 2023-10-07 MED ORDER — LACTATED RINGERS IV SOLN: INTRAVENOUS | Status: AC

## 2023-10-07 MED ORDER — LACTATED RINGERS IV BOLUS
1000.0000 mL | Freq: Once | INTRAVENOUS | Status: AC
  Administered 2023-10-07: 1000 mL via INTRAVENOUS

## 2023-10-07 MED ORDER — ONDANSETRON HCL 4 MG/2ML IJ SOLN: 4.0000 mg | Freq: Four times a day (QID) | INTRAMUSCULAR | Status: DC | PRN

## 2023-10-07 MED ORDER — ROSUVASTATIN CALCIUM 5 MG PO TABS
5.0000 mg | ORAL_TABLET | Freq: Every day | ORAL | Status: DC
  Administered 2023-10-07 – 2023-10-09 (×3): 5 mg via ORAL
  Filled 2023-10-07 (×3): qty 1

## 2023-10-07 MED ORDER — APIXABAN 5 MG PO TABS
5.0000 mg | ORAL_TABLET | Freq: Two times a day (BID) | ORAL | Status: DC
  Administered 2023-10-07 – 2023-10-10 (×6): 5 mg via ORAL
  Filled 2023-10-07 (×6): qty 1

## 2023-10-07 NOTE — ED Provider Notes (Addendum)
 [place] EMERGENCY DEPARTMENT AT [facility] Provider Note   CSN: [id number] Arrival date & time: 10/07/23  9049     Patient presents with: Diarrhea   Paula Booker is a 78 y.o. female.   HPI     78 year old female comes in with chief complaint of diarrhea, dizziness, weakness. Patient has past medical history of nonischemic cardiomyopathy, paroxysmal A-fib, hypertension.  She states that she has been having diarrhea now for 7 days now.  She is having around 10-12 bowel movements a day.  Stools are watery, loose, nonbloody.  She has been taking loperamide  4 times a day, but having diarrhea despite that.  She does not have any nighttime symptoms.  Review of system is negative for fevers.  Patient typically has some cramping pain with the diarrhea.  Patient has nausea, without emesis.  In the last week, she has noted worsening weakness, dizziness.  She decided to come to the ER as her symptoms are not improving and she is feeling dehydrated, she is concerned that she might further weaken and get worse.  Patient lives by herself.  Patient denies any recent antibiotic use.  No recent travel history or sick contacts.  Prior to Admission medications   Medication Sig Start Date End Date Taking? Authorizing Provider  acetaminophen  (TYLENOL ) 500 MG tablet Take 500 mg by mouth in the morning and at bedtime.    [provider]  albuterol  (VENTOLIN  HFA) 108 (90 Base) MCG/ACT inhaler INHALE 2 PUFFS BY MOUTH EVERY 6 HOURS AS NEEDED FOR WHEEZING OR SHORTNESS OF BREATH 07/11/23   Mannam, Praveen, MD  apixaban  (ELIQUIS ) 5 MG TABS tablet Take 1 tablet by mouth twice daily 09/07/23   Dann Candyce RAMAN, MD  Calcium  Carb-Cholecalciferol  600-20 MG-MCG TABS Take 1 tablet by mouth in the morning.    [provider]  carvedilol  (COREG ) 6.25 MG tablet Take 1 tablet by mouth twice daily 06/15/23   Waddell Danelle ORN, MD  cetirizine (ZYRTEC) 10 MG tablet Take 10 mg by mouth in the  morning.    [provider]  Cholecalciferol  (VITAMIN D3) 5000 units CAPS Take 5,000 Units by mouth in the morning.    [provider]  Collagen-Vitamin C (COLLAGEN PLUS VITAMIN C PO) Take 1 tablet by mouth in the morning.    [provider]  Cyanocobalamin  (VITAMIN B-12) 5000 MCG LOZG Take 5,000 mcg by mouth in the morning.    [provider]  escitalopram  (LEXAPRO ) 20 MG tablet Take 20 mg by mouth in the morning. 10/23/20   [provider]  fluticasone  furoate-vilanterol (BREO ELLIPTA ) 200-25 MCG/ACT AEPB Inhale 1 puff by mouth once daily 06/14/23   Mannam, Praveen, MD  furosemide  (LASIX ) 40 MG tablet TAKE 1 TO 2 TABLETS BY MOUTH AS DIRECTED ONCE DAILY FOR  EDEMA,  SHORTNESS  OF  BREATH  OR  WEIGHT  GAIN. 12/14/22   Waddell Danelle ORN, MD  ipratropium-albuterol  (DUONEB) 0.5-2.5 (3) MG/3ML SOLN Take 3 mLs by nebulization every 4 (four) hours as needed. 06/03/22   Mannam, Praveen, MD  methocarbamol  (ROBAXIN ) 500 MG tablet Take 1 tablet (500 mg total) by mouth 2 (two) times daily. 06/19/23   Cardama, Raynell Moder, MD  MOUNJARO 2.5 MG/0.5ML Pen Inject 2.5 mg into the skin once a week. 08/02/23   [provider]  Multiple Vitamin (MULTIVITAMIN WITH MINERALS) TABS tablet Take 1 tablet by mouth in the morning.    [provider]  pantoprazole  (PROTONIX ) 40 MG tablet Take 40  mg by mouth 2 (two) times daily. 08/29/17   [provider]  rosuvastatin  (CRESTOR ) 5 MG tablet Take 5 mg by mouth at bedtime. 11/18/20   [provider]  sacubitril-valsartan (ENTRESTO ) 24-26 MG Take 1 tablet by mouth twice daily 09/07/23   Waddell Danelle ORN, MD  spironolactone  (ALDACTONE ) 25 MG tablet Take 1 tablet (25 mg total) by mouth daily. 08/18/22   Waddell Danelle ORN, MD    Allergies: Ivp dye [iodinated contrast media], Penicillins, Sulfonamide derivatives, Tessalon [benzonatate], Sulfa antibiotics, Rosuvastatin  calcium , and Onion    Review of Systems  All other  systems reviewed and are negative.   Updated Vital Signs BP (!) 88/49 (BP Location: Right Arm)   Pulse 88   Temp 97.6 F (36.4 C)   Resp 16   SpO2 97%   Physical Exam Vitals and nursing note reviewed.  Constitutional:      Appearance: She is well-developed.  HENT:     Head: Atraumatic.     Mouth/Throat:     Mouth: Mucous membranes are dry.   Eyes:     Extraocular Movements: Extraocular movements intact.     Pupils: Pupils are equal, round, and reactive to light.    Cardiovascular:     Rate and Rhythm: Normal rate.  Pulmonary:     Effort: Pulmonary effort is normal.   Musculoskeletal:     Cervical back: Normal range of motion and neck supple.   Skin:    General: Skin is warm and dry.   Neurological:     Mental Status: She is alert and oriented to person, place, and time.     (all labs ordered are listed, but only abnormal results are displayed) Labs Reviewed  URINALYSIS, ROUTINE W REFLEX MICROSCOPIC - Abnormal; Notable for the following components:      Result Value   Specific Gravity, Urine <1.005 (*)    Hgb urine dipstick TRACE (*)    Bacteria, UA RARE (*)    All other components within normal limits  COMPREHENSIVE METABOLIC PANEL WITH GFR - Abnormal; Notable for the following components:   Glucose, Bld 109 (*)    All other components within normal limits  GASTROINTESTINAL PANEL BY PCR, STOOL (REPLACES STOOL CULTURE)  CBC  LIPASE, BLOOD  MAGNESIUM     EKG: None  Radiology: No results found.   Procedures   Medications Ordered in the ED  lactated ringers  infusion (has no administration in time range)  lactated ringers  bolus 1,000 mL (1,000 mLs Intravenous New Bag/Given 10/07/23 1441)    Clinical Course as of 10/07/23 1514  Sat Oct 07, 2023  1514 BP(!): 88/49 When checking orthostasis, patient noted to be hypotensive.  Heart rate jumped from 69-88. Patient felt dizzy upon getting up.  With her hypotension, clinical orthostasis, and her heart  rate jumping almost 20, indicating objective orthostasis, we requested admission. [AN]    Clinical Course User Index [AN] Charlyn Sora, MD                                 Medical Decision Making Amount and/or Complexity of Data Reviewed Labs: ordered.  Risk Prescription drug management. Decision regarding hospitalization.   78 year old female with history of CHF, A-fib comes in with chief complaint of weakness, dizziness, diarrhea.  Differential diagnosis for this patient includes functional diarrhea, malabsorption diarrhea, infectious diarrhea, paraneoplastic process, severe dehydration and orthostatic hypotension.  Clinically, it does not appear that patient has  fevers, bloody stools and a severe abdominal pain -therefore less likely this to be C. difficile or bacterial infection.  Vitals to get basic labs and then reassess the patient.  I reviewed patient's PCP notes.  We got basic labs were evident.  Interpreted patient's labs, they are not showing any renal failure.  Clinically patient appears dry.  She lives by herself.  She is on blood thinners.  She is getting weaker, unable to keep up with ADLs and also having difficulty with the food.  Patient will be admitted for observation status for dehydration.  Final diagnoses:  Dehydration  Diarrhea, unspecified type  Orthostatic dizziness  Hypovolemia dehydration    ED Discharge Orders     None          Charlyn Sora, MD 10/07/23 1407    Charlyn Sora, MD 10/07/23 1514

## 2023-10-07 NOTE — ED Triage Notes (Signed)
 She reports having ~ 10 diarrhea stools/day x 1 week. She denies fever and is in no distress. She c/o diffuse abd. Discomfort, centered at peri umbilicus.

## 2023-10-07 NOTE — H&P (Signed)
 History and Physical  Paula Booker FMW:[id number] DOB: April 20, 1945 DOA: 10/07/2023  PCP: Frederik Charleston, MD   Chief Complaint: Diarrhea  HPI: Paula Booker is a 78 y.o. female with medical history significant for asthma, A-fib on Eliquis , anxiety and depression, HFrEF s/p CRT-D, anemia, HTN, HLD and GERD who presented to [facility] for evaluation of diarrhea.  Patient reports she [place] and got some new to metals last Friday and the next night, she started having some diarrhea.  Since then, she has had persistent nonbloody diarrhea up to 10 times per day.  She has been able to tolerate foods such as eggs, white bread, pot pie and a sandwiches without any nausea or vomiting but everything she eats run through her.  She endorsed lightheadedness and mild abdominal cramping but no fevers, chills, headache, chest pain, dysuria or bloody stools. Over the last few days, she has tried Imodium  4 times a day without any improvement.  She endorses feeling weak and dizzy due to recent decreased p.o. intake in the setting of her diarrhea.   ED Course: Initial vitals show temp 97.6, RR 18, HR 76, BP 118/74, SpO2 98% on room air, while in the ED, patient became hypotensive with SBP dropping to the 100s. Initial labs significant for normal kidney function, electrolytes, LFTs, CBC, lipase, magnesium  and UA with no signs of infection. Pt received IV LR 1 L bolus and started on IV LR 125 cc/h infusion.  Patient was admitted to [facility] service for hypotension and transferred to [facility].  Review of Systems: Please see HPI for pertinent positives and negatives. A complete 10 system review of systems are otherwise negative.  Past Medical History:  Diagnosis Date   AICD (automatic cardioverter/defibrillator) present    Anemia    as a child   Anxiety    Arthritis    hands and knees (03/22/2012)   Asthma    Basal cell carcinoma 04/27/2011   mid forehead(MOHS)   Bursitis    CHF  (congestive heart failure) (HCC) 09/2011   pt. denies   Depression    Dysrhythmia    WPW   GERD (gastroesophageal reflux disease)    Head injury, acute, with loss of consciousness (HCC)    History of hiatal hernia    Hypertension    ICD (implantable cardiac defibrillator) in place 03/2012   Migraines    migraines    Nausea vomiting and diarrhea 03/18/2015   NICM (nonischemic cardiomyopathy) (HCC)    Obesity (BMI 30-39.9)    negative sleep study 2014   OSA (obstructive sleep apnea) 05/27/2018   Mild with AHI 6/hr    no cpap   Pneumonia    hx   Shortness of breath    @ any time I can get SOB (03/22/2012)   Skin cancer 1999   Squamous cell carcinoma of skin 07/08/2020   in situ right malar cheek-[id number]   Sudden arrhythmia death syndrome    WPW (Wolff-Parkinson-White syndrome)    Past Surgical History:  Procedure Laterality Date   BIOPSY  09/07/2021   Procedure: BIOPSY;  Surgeon: Elicia Claw, MD;  Location: [facility];  Service: Gastroenterology;;   JUVENTINO LLANO N/A 05/16/2022   Procedure: BIV LLANO;  Surgeon: Waddell Danelle ORN, MD;  Location: [facility];  Service: Cardiovascular;  Laterality: N/A;   CARDIAC CATHETERIZATION  09/18/11   no significant CAD   CARDIAC DEFIBRILLATOR PLACEMENT  03/22/2012   DDD   CHOLECYSTECTOMY  ~ 2003  ESOPHAGOGASTRODUODENOSCOPY (EGD) WITH PROPOFOL  N/A 09/07/2021   Procedure: ESOPHAGOGASTRODUODENOSCOPY (EGD) WITH PROPOFOL ;  Surgeon: Elicia Claw, MD;  Location: [facility];  Service: Gastroenterology;  Laterality: N/A;   IMPLANTABLE CARDIOVERTER DEFIBRILLATOR IMPLANT N/A 03/22/2012   Procedure: IMPLANTABLE CARDIOVERTER DEFIBRILLATOR IMPLANT;  Surgeon: Danelle LELON Birmingham, MD;  Location: [facility] C[facility] LAB;  Service: Cardiovascular;  Laterality: N/A;   KNEE ARTHROSCOPY Left 11/29/2017   Procedure: ARTHROSCOPY LEFT KNEE;  Surgeon: Yvone Rush, MD;  Location: [facility];  Service: Orthopedics;  Laterality: Left;   KNEE ARTHROSCOPY Right 08/09/2019    Procedure: ARTHROSCOPY RIGHT KNEE, CHONDROPLASTY,  PARTIAL MEDIAL  MENSICECTOMY;  Surgeon: Yvone Rush, MD;  Location: [facility];  Service: Orthopedics;  Laterality: Right;   KYPHOPLASTY N/A 11/27/2013   Procedure: KYPHOPLASTY;  Surgeon: Oneil Rodgers Priestly, MD;  Location: [facility] OR;  Service: Orthopedics;  Laterality: N/A;  T9, T11 kyphoplasty   KYPHOPLASTY N/A 06/05/2014   Procedure: KYPHOPLASTY;  Surgeon: Oneil Rodgers Priestly, MD;  Location: [facility] OR;  Service: Orthopedics;  Laterality: N/A;  T7 kyphoplasty   MOHS SURGERY  06/2011   forehead (03/22/2012)   SKIN CANCER EXCISION  1999   top of my head and my right back shoulder   SKIN CANCER EXCISION     back   SKIN CANCER EXCISION     face   SUPRAVENTRICULAR TACHYCARDIA ABLATION  03/22/2012   unable to induce VT/RN report 03/22/2012   SUPRAVENTRICULAR TACHYCARDIA ABLATION N/A 03/22/2012   Procedure: SUPRAVENTRICULAR TACHYCARDIA ABLATION;  Surgeon: Danelle LELON Birmingham, MD;  Location: [facility] CATH LAB;  Service: Cardiovascular;  Laterality: N/A;   TONSILLECTOMY AND ADENOIDECTOMY  1950   TUBAL LIGATION  1978   Social History:  reports that she has never smoked. She has never used smokeless tobacco. She reports that she does not currently use alcohol. She reports that she does not use drugs.  Allergies  Allergen Reactions   Ivp Dye [Iodinated Contrast Media] Hives and Other (See Comments)    pt tolerates with benadryl    Penicillins Anaphylaxis    Has patient had a PCN reaction causing immediate rash, facial/tongue/throat swelling, SOB or lightheadedness with hypotension: Yes Has patient had a PCN reaction causing severe rash involving mucus membranes or skin necrosis: No Has patient had a PCN reaction that required hospitalization No Has patient had a PCN reaction occurring within the last 10 years: No If all of the above answers are NO, then may proceed with Cephalosporin use.   Sulfonamide Derivatives Other (See Comments)    Migraines    Tessalon [Benzonatate] Hives and Rash    Severe rash   Sulfa Antibiotics Other (See Comments)    Migraines   Rosuvastatin  Calcium  Itching   Onion Other (See Comments)    Raw onions cause migraines, patient can eat cooked onions     Family History  Problem Relation Age of Onset   Congestive Heart Failure Mother        died 22   Kidney Stones Mother    Deafness Mother    Atrial fibrillation Mother    Congestive Heart Failure Father    Deafness Father    Healthy Sister    Healthy Sister    Healthy Sister    Congestive Heart Failure Brother    Atrial fibrillation Brother    Tuberculosis Paternal Grandfather    Migraines Neg Hx    Neural tube defect Neg Hx      Prior to Admission medications   Medication Sig Start Date End Date Taking? Authorizing Provider  acetaminophen  (TYLENOL ) 500 MG tablet Take 500 mg by mouth in the morning and at bedtime.    [provider]  albuterol  (VENTOLIN  HFA) 108 (90 Base) MCG/ACT inhaler INHALE 2 PUFFS BY MOUTH EVERY 6 HOURS AS NEEDED FOR WHEEZING OR SHORTNESS OF BREATH 07/11/23   Mannam, Praveen, MD  apixaban  (ELIQUIS ) 5 MG TABS tablet Take 1 tablet by mouth twice daily 09/07/23   Dann Candyce RAMAN, MD  Calcium  Carb-Cholecalciferol  600-20 MG-MCG TABS Take 1 tablet by mouth in the morning.    [provider]  carvedilol  (COREG ) 6.25 MG tablet Take 1 tablet by mouth twice daily 06/15/23   Waddell Danelle ORN, MD  cetirizine (ZYRTEC) 10 MG tablet Take 10 mg by mouth in the morning.    [provider]  Cholecalciferol  (VITAMIN D3) 5000 units CAPS Take 5,000 Units by mouth in the morning.    [provider]  Collagen-Vitamin C (COLLAGEN PLUS VITAMIN C PO) Take 1 tablet by mouth in the morning.    [provider]  Cyanocobalamin  (VITAMIN B-12) 5000 MCG LOZG Take 5,000 mcg by mouth in the morning.    [provider]  escitalopram  (LEXAPRO ) 20 MG tablet Take 20 mg by mouth in the morning. 10/23/20   [provider]  fluticasone  furoate-vilanterol (BREO ELLIPTA ) 200-25 MCG/ACT AEPB Inhale 1 puff by mouth once daily 06/14/23   Mannam, Praveen, MD  furosemide  (LASIX ) 40 MG tablet TAKE 1 TO 2 TABLETS BY MOUTH AS DIRECTED ONCE DAILY FOR  EDEMA,  SHORTNESS  OF  BREATH  OR  WEIGHT  GAIN. 12/14/22   Waddell Danelle ORN, MD  ipratropium-albuterol  (DUONEB) 0.5-2.5 (3) MG/3ML SOLN Take 3 mLs by nebulization every 4 (four) hours as needed. 06/03/22   Mannam, Praveen, MD  methocarbamol  (ROBAXIN ) 500 MG tablet Take 1 tablet (500 mg total) by mouth 2 (two) times daily. 06/19/23   Cardama, Raynell Moder, MD  MOUNJARO 2.5 MG/0.5ML Pen Inject 2.5 mg into the skin once a week. 08/02/23   [provider]  Multiple Vitamin (MULTIVITAMIN WITH MINERALS) TABS tablet Take 1 tablet by mouth in the morning.    [provider]  pantoprazole  (PROTONIX ) 40 MG tablet Take 40 mg by mouth 2 (two) times daily. 08/29/17   [provider]  rosuvastatin  (CRESTOR ) 5 MG tablet Take 5 mg by mouth at bedtime. 11/18/20   [provider]  sacubitril-valsartan (ENTRESTO ) 24-26 MG Take 1 tablet by mouth twice daily 09/07/23   Waddell Danelle ORN, MD  spironolactone  (ALDACTONE ) 25 MG tablet Take 1 tablet (25 mg total) by mouth daily. 08/18/22   Waddell Danelle ORN, MD    Physical Exam: BP 116/72 (BP Location: Right Arm)   Pulse 71   Temp (!) 97.5 F (36.4 C)   Resp 16   SpO2 96%  General: Pleasant, well-appearing elderly woman laying in bed. No acute distress. HEENT: [place]/AT. Anicteric sclera. Dry mucous membrane CV: Regular rate. Paced rhythm. No murmurs, rubs, or gallops. No LE edema Pulmonary: Lungs CTAB. Normal effort. No wheezing or rales. Abdominal: Soft, nontender, nondistended. Hyperactive bowel sounds. Extremities: Palpable radial and DP pulses. Normal ROM. Skin: Warm and dry. No obvious rash or lesions. Neuro: A&Ox3. Moves all extremities. Normal sensation to light touch. No focal deficit. Psych: Normal mood  and affect          Labs on Admission:  Basic Metabolic Panel: Recent Labs  Lab 10/07/23 1236  NA 137  K 3.5  CL 103  CO2 23  GLUCOSE 109*  BUN 22  CREATININE 0.78  CALCIUM  9.6  MG 2.0   Liver Function Tests: Recent Labs  Lab 10/07/23 1236  AST 19  ALT 8  ALKPHOS 88  BILITOT 0.5  PROT 7.0  ALBUMIN  4.1   Recent Labs  Lab 10/07/23 1236  LIPASE 38   No results for input(s): AMMONIA in the last 168 hours. CBC: Recent Labs  Lab 10/07/23 1024  WBC 10.4  HGB 15.0  HCT 43.5  MCV 91.4  PLT 261   Cardiac Enzymes: No results for input(s): CKTOTAL, CKMB, CKMBINDEX, TROPONINI in the last 168 hours. BNP (last 3 results) Recent Labs    06/18/23 1530  BNP 42.8    ProBNP (last 3 results) No results for input(s): PROBNP in the last 8760 hours.  CBG: No results for input(s): GLUCAP in the last 168 hours.  Radiological Exams on Admission: No results found. Assessment/Plan PAM VANALSTINE is a 78 y.o. female with medical history significant for  asthma, A-fib on Eliquis , anxiety and depression, HFrEF s/p CRT-D, anemia, HTN, HLD and GERD who presented to [facility] for evaluation of diarrhea and admitted for hypotension.  # Hypotension - Presented with persistent watery diarrhea for 1 week - Found to be hypotensive with SBP in the 100s while in the ED - Secondary to hypovolemia in the setting of decreased p.o. intake and persistent diarrhea - Continue IV LR 125 cc/h for additional 8 hours - IV fluid boluses to maintain MAP > 65  # Diarrhea - Presented persistent watery diarrhea for 1 week - No systemic signs of infection such as fevers, chills, nausea or vomiting - Reports mild abdominal cramping prior to the diarrhea but no abdominal pain - Continue supportive care with IV hydration - Follow-up GI panel - Enteric precautions - Consider abdominal imaging if patient develops abdominal pain  # A-fib - Patient with paced rhythm on heart  auscultation - Continue Eliquis  - Hold Coreg  in the setting of hypotension  # HFrEF s/p CRT-D - Patient dry on exam - GDMT includes Coreg , Entresto , spironolactone  and Lasix  - Hold GDMT in the setting of hypotension  # Asthma - Stable and chronic - Continue Breo Ellipta  - As needed DuoNebs  # HLD - Continue rosuvastatin   # Anxiety and depression - Continue Lexapro   # GERD - Continue Protonix   DVT prophylaxis: Eliquis     Code Status: Full Code  Consults called: None  Family Communication: No family at bedside  Severity of Illness: The appropriate patient status for this patient is OBSERVATION. Observation status is judged to be reasonable and necessary in order to provide the required intensity of service to ensure the patient's safety. The patient's presenting symptoms, physical exam findings, and initial radiographic and laboratory data in the context of their medical condition is felt to place them at decreased risk for further clinical deterioration. Furthermore, it is anticipated that the patient will be medically stable for discharge from the hospital within 2 midnights of admission.   Level of care: Med-Surg   This record has been created using Conservation officer, historic buildings. Errors have been sought and corrected, but may not always be located. Such creation errors do not reflect on the standard of care.   Lou Claretta HERO, MD 10/07/2023, 6:53 PM [facility] Pager: [phone] Isaiah 41:10   If 7PM-7AM, please contact night-coverage [email or web address] Password [email or web address]

## 2023-10-07 NOTE — ED Notes (Signed)
 I have just given report to Elonda, Charity fundraiser at [address] [employer].

## 2023-10-08 DIAGNOSIS — E860 Dehydration: Secondary | ICD-10-CM

## 2023-10-08 DIAGNOSIS — R197 Diarrhea, unspecified: Secondary | ICD-10-CM | POA: Diagnosis not present

## 2023-10-08 DIAGNOSIS — R42 Dizziness and giddiness: Secondary | ICD-10-CM | POA: Diagnosis not present

## 2023-10-08 DIAGNOSIS — E861 Hypovolemia: Secondary | ICD-10-CM | POA: Diagnosis not present

## 2023-10-08 LAB — COMPREHENSIVE METABOLIC PANEL WITH GFR
ALT: 9 U/L (ref 0–44)
AST: 14 U/L — ABNORMAL LOW (ref 15–41)
Albumin: 3.4 g/dL — ABNORMAL LOW (ref 3.5–5.0)
Alkaline Phosphatase: 62 U/L (ref 38–126)
Anion gap: 8 (ref 5–15)
BUN: 17 mg/dL (ref 8–23)
CO2: 24 mmol/L (ref 22–32)
Calcium: 8.8 mg/dL — ABNORMAL LOW (ref 8.9–10.3)
Chloride: 105 mmol/L (ref 98–111)
Creatinine, Ser: 0.75 mg/dL (ref 0.44–1.00)
GFR, Estimated: >60 mL/min (ref >60)
Glucose, Bld: 95 mg/dL (ref 70–99)
Potassium: 3.1 mmol/L — ABNORMAL LOW (ref 3.5–5.1)
Sodium: 137 mmol/L (ref 135–145)
Total Bilirubin: 0.8 mg/dL (ref 0.0–1.2)
Total Protein: 6 g/dL — ABNORMAL LOW (ref 6.5–8.1)

## 2023-10-08 LAB — CBC
HCT: 38.9 % (ref 36.0–46.0)
Hemoglobin: 12.9 g/dL (ref 12.0–15.0)
MCH: 31.3 pg (ref 26.0–34.0)
MCHC: 33.2 g/dL (ref 30.0–36.0)
MCV: 94.4 fL (ref 80.0–100.0)
Platelets: 213 10*3/uL (ref 150–400)
RBC: 4.12 MIL/uL (ref 3.87–5.11)
RDW: 12.7 % (ref 11.5–15.5)
WBC: 8.5 10*3/uL (ref 4.0–10.5)
nRBC: 0 % (ref 0.0–0.2)

## 2023-10-08 LAB — TROPONIN I (HIGH SENSITIVITY)
Troponin I (High Sensitivity): 6 ng/L (ref <18)
Troponin I (High Sensitivity): 7 ng/L (ref <18)

## 2023-10-08 MED ORDER — SODIUM CHLORIDE 0.9 % IV SOLN: INTRAVENOUS | Status: DC

## 2023-10-08 MED ORDER — LACTATED RINGERS IV SOLN: INTRAVENOUS | Status: DC

## 2023-10-08 MED ORDER — POTASSIUM CHLORIDE CRYS ER 20 MEQ PO TBCR
60.0000 meq | EXTENDED_RELEASE_TABLET | Freq: Once | ORAL | Status: AC
  Administered 2023-10-08: 60 meq via ORAL
  Filled 2023-10-08: qty 3

## 2023-10-08 MED ORDER — LACTATED RINGERS IV SOLN: INTRAVENOUS | Status: AC

## 2023-10-08 NOTE — Plan of Care (Signed)
   Problem: Health Behavior/Discharge Planning: Goal: Ability to manage health-related needs will improve Outcome: Progressing   Problem: Education: Goal: Knowledge of General Education information will improve Description: Including pain rating scale, medication(s)/side effects and non-pharmacologic comfort measures Outcome: Progressing

## 2023-10-08 NOTE — Plan of Care (Signed)

## 2023-10-08 NOTE — Consult Note (Signed)
 [facility] Gastroenterology Consult  Referring Provider: No ref. provider found Primary Care Physician:  Frederik Charleston, MD Primary Gastroenterologist: [facility] Elicia  Reason for Consultation: Acute diarrhea  SUBJECTIVE:   HPI: Paula Booker is a 78 y.o. female with past medical history significant for heart failure with reduced ejection fraction, atrial fibrillation on Eliquis .  Presented to hospital with chief complaint of intractable, acute diarrhea.  Diarrheal symptoms began 09/30/2023.  Had been visiting with a friend who had traveled to [place].  No sick contacts.  Had gone to a farmers market, no other aggravating foods.  No recent antibiotic exposure.  No recent changes in medications.  Diarrhea worse after eating.  No prior colonoscopy.  She is status postcholecystectomy.  Seen in office in October 2024 by Dr. Elicia, was experiencing diarrheal symptoms at that time and was given prescription for colestipol, patient noted that she took this and it resolved her diarrhea.  A colonoscopy was recommended to her though she has not completed this.  She noted that she completed Cologuard testing.  EGD 09/07/2021 to evaluate for varices given concern for cirrhosis based on imaging (Dr. Elicia) no varices, scattered gastric erythema, few gastric polyps in the fundus and body, duodenum within normal limits.  Past Medical History:  Diagnosis Date   AICD (automatic cardioverter/defibrillator) present    Anemia    as a child   Anxiety    Arthritis    hands and knees (03/22/2012)   Asthma    Basal cell carcinoma 04/27/2011   mid forehead(MOHS)   Bursitis    CHF (congestive heart failure) (HCC) 09/2011   pt. denies   Depression    Dysrhythmia    WPW   GERD (gastroesophageal reflux disease)    Head injury, acute, with loss of consciousness (HCC)    History of hiatal hernia    Hypertension    ICD (implantable cardiac defibrillator) in place 03/2012   Migraines    migraines     Nausea vomiting and diarrhea 03/18/2015   NICM (nonischemic cardiomyopathy) (HCC)    Obesity (BMI 30-39.9)    negative sleep study 2014   OSA (obstructive sleep apnea) 05/27/2018   Mild with AHI 6/hr    no cpap   Pneumonia    hx   Shortness of breath    @ any time I can get SOB (03/22/2012)   Skin cancer 1999   Squamous cell carcinoma of skin 07/08/2020   in situ right malar cheek-[id number]   Sudden arrhythmia death syndrome    WPW (Wolff-Parkinson-White syndrome)    Past Surgical History:  Procedure Laterality Date   BIOPSY  09/07/2021   Procedure: BIOPSY;  Surgeon: Elicia Claw, MD;  Location: [facility];  Service: Gastroenterology;;   JUVENTINO LLANO N/A 05/16/2022   Procedure: BIV LLANO;  Surgeon: Waddell Danelle ORN, MD;  Location: [facility];  Service: Cardiovascular;  Laterality: N/A;   CARDIAC CATHETERIZATION  09/18/11   no significant CAD   CARDIAC DEFIBRILLATOR PLACEMENT  03/22/2012   DDD   CHOLECYSTECTOMY  ~ 2003   ESOPHAGOGASTRODUODENOSCOPY (EGD) WITH PROPOFOL  N/A 09/07/2021   Procedure: ESOPHAGOGASTRODUODENOSCOPY (EGD) WITH PROPOFOL ;  Surgeon: Elicia Claw, MD;  Location: [facility];  Service: Gastroenterology;  Laterality: N/A;   IMPLANTABLE CARDIOVERTER DEFIBRILLATOR IMPLANT N/A 03/22/2012   Procedure: IMPLANTABLE CARDIOVERTER DEFIBRILLATOR IMPLANT;  Surgeon: Danelle ORN Waddell, MD;  Location[facility] CATH LAB;  Service: Cardiovascular;  Laterality: N/A;   KNEE ARTHROSCOPY Left 11/29/2017   Procedure: ARTHROSCOPY LEFT KNEE;  Surgeon:  Yvone Rush, MD;  Location: [facility];  Service: Orthopedics;  Laterality: Left;   KNEE ARTHROSCOPY Right 08/09/2019   Procedure: ARTHROSCOPY RIGHT KNEE, CHONDROPLASTY,  PARTIAL MEDIAL  MENSICECTOMY;  Surgeon: Yvone Rush, MD;  Location: [facility];  Service: Orthopedics;  Laterality: Right;   KYPHOPLASTY N/A 11/27/2013   Procedure: KYPHOPLASTY;  Surgeon: Oneil Rodgers Priestly, MD;  Location: [facility] OR;  Service: Orthopedics;  Laterality: N/A;   T9, T11 kyphoplasty   KYPHOPLASTY N/A 06/05/2014   Procedure: KYPHOPLASTY;  Surgeon: Oneil Rodgers Priestly, MD;  Location: [facility] OR;  Service: Orthopedics;  Laterality: N/A;  T7 kyphoplasty   MOHS SURGERY  06/2011   forehead (03/22/2012)   SKIN CANCER EXCISION  1999   top of my head and my right back shoulder   SKIN CANCER EXCISION     back   SKIN CANCER EXCISION     face   SUPRAVENTRICULAR TACHYCARDIA ABLATION  03/22/2012   unable to induce VT/RN report 03/22/2012   SUPRAVENTRICULAR TACHYCARDIA ABLATION N/A 03/22/2012   Procedure: SUPRAVENTRICULAR TACHYCARDIA ABLATION;  Surgeon: Danelle LELON Birmingham, MD;  Location: [facility] CATH LAB;  Service: Cardiovascular;  Laterality: N/A;   TONSILLECTOMY AND ADENOIDECTOMY  1950   TUBAL LIGATION  1978   Prior to Admission medications   Medication Sig Start Date End Date Taking? Authorizing Provider  acetaminophen  (TYLENOL ) 500 MG tablet Take 500 mg by mouth in the morning and at bedtime.   Yes [provider]  albuterol  (VENTOLIN  HFA) 108 (90 Base) MCG/ACT inhaler INHALE 2 PUFFS BY MOUTH EVERY 6 HOURS AS NEEDED FOR WHEEZING OR SHORTNESS OF BREATH 07/11/23  Yes Mannam, Praveen, MD  apixaban  (ELIQUIS ) 5 MG TABS tablet Take 1 tablet by mouth twice daily 09/07/23  Yes Dann Candyce RAMAN, MD  Calcium  Carb-Cholecalciferol  600-20 MG-MCG TABS Take 1 tablet by mouth in the morning.   Yes [provider]  carvedilol  (COREG ) 6.25 MG tablet Take 1 tablet by mouth twice daily 06/15/23  Yes Birmingham Danelle LELON, MD  cetirizine (ZYRTEC) 10 MG tablet Take 10 mg by mouth in the morning.   Yes [provider]  Cholecalciferol  (VITAMIN D3) 5000 units CAPS Take 5,000 Units by mouth in the morning.   Yes [provider]  Collagen-Vitamin C (COLLAGEN PLUS VITAMIN C PO) Take 1 tablet by mouth in the morning.   Yes [provider]  Cyanocobalamin  (VITAMIN B-12) 5000 MCG LOZG Take 5,000 mcg by mouth in the morning.   Yes [provider]   escitalopram  (LEXAPRO ) 20 MG tablet Take 20 mg by mouth in the morning. 10/23/20  Yes [provider]  fluticasone  furoate-vilanterol (BREO ELLIPTA ) 200-25 MCG/ACT AEPB Inhale 1 puff by mouth once daily 06/14/23  Yes Mannam, Praveen, MD  furosemide  (LASIX ) 40 MG tablet TAKE 1 TO 2 TABLETS BY MOUTH AS DIRECTED ONCE DAILY FOR  EDEMA,  SHORTNESS  OF  BREATH  OR  WEIGHT  GAIN. 12/14/22  Yes Birmingham Danelle LELON, MD  ipratropium-albuterol  (DUONEB) 0.5-2.5 (3) MG/3ML SOLN Take 3 mLs by nebulization every 4 (four) hours as needed. 06/03/22  Yes Mannam, Praveen, MD  MOUNJARO 2.5 MG/0.5ML Pen Inject 2.5 mg into the skin once a week. 08/02/23  Yes [provider]  Multiple Vitamin (MULTIVITAMIN WITH MINERALS) TABS tablet Take 1 tablet by mouth in the morning.   Yes [provider]  pantoprazole  (PROTONIX ) 40 MG tablet Take 40 mg by mouth 2 (two) times daily. 08/29/17  Yes [provider]  rosuvastatin  (CRESTOR ) 5 MG tablet  Take 5 mg by mouth at bedtime. 11/18/20  Yes [provider]  sacubitril-valsartan (ENTRESTO ) 24-26 MG Take 1 tablet by mouth twice daily 09/07/23  Yes Waddell Danelle ORN, MD  spironolactone  (ALDACTONE ) 25 MG tablet Take 1 tablet (25 mg total) by mouth daily. 08/18/22  Yes Waddell Danelle ORN, MD  methocarbamol  (ROBAXIN ) 500 MG tablet Take 1 tablet (500 mg total) by mouth 2 (two) times daily. Patient not taking: Reported on 10/07/2023 06/19/23   Trine Raynell Moder, MD  Zinc  100 MG TABS Take 1 tablet by mouth daily at 6 (six) AM.    [provider]   Current Facility-Administered Medications  Medication Dose Route Frequency Provider Last Rate Last Admin   0.9 %  sodium chloride  infusion   Intravenous Continuous Jillian Buttery, MD 50 mL/hr at 10/08/23 1242 New Bag at 10/08/23 1242   acetaminophen  (TYLENOL ) tablet 650 mg  650 mg Oral Q6H PRN Amponsah, Prosper M, MD       Or   acetaminophen  (TYLENOL ) suppository 650 mg  650 mg Rectal Q6H PRN Lou Claretta HERO, MD       acetaminophen  (TYLENOL ) tablet 500 mg  500 mg Oral BID Amponsah, Prosper M, MD   500 mg at 10/08/23 9093   apixaban  (ELIQUIS ) tablet 5 mg  5 mg Oral BID Amponsah, Prosper M, MD   5 mg at 10/08/23 9092   calcium -vitamin D  (OSCAL WITH D) 500-5 MG-MCG per tablet 1 tablet  1 tablet Oral Q breakfast Lou Claretta HERO, MD   1 tablet at 10/08/23 9092   cholecalciferol  (VITAMIN D3) 25 MCG (1000 UNIT) tablet 5,000 Units  5,000 Units Oral Daily Amponsah, Prosper M, MD   5,000 Units at 10/08/23 9093   cyanocobalamin  (VITAMIN B12) tablet 5,000 mcg  5,000 mcg Oral Daily Lou Claretta HERO, MD   5,000 mcg at 10/08/23 9093   escitalopram  (LEXAPRO ) tablet 20 mg  20 mg Oral Daily Amponsah, Prosper M, MD   20 mg at 10/08/23 9093   fluticasone  furoate-vilanterol (BREO ELLIPTA ) 200-25 MCG/ACT 1 puff  1 puff Inhalation Daily Lou Claretta HERO, MD   1 puff at 10/08/23 0802   ipratropium-albuterol  (DUONEB) 0.5-2.5 (3) MG/3ML nebulizer solution 3 mL  3 mL Nebulization Q4H PRN Lou Claretta HERO, MD       loperamide  (IMODIUM ) capsule 4 mg  4 mg Oral BID PRN Amponsah, Prosper M, MD       loratadine  (CLARITIN ) tablet 10 mg  10 mg Oral Daily Amponsah, Prosper M, MD   10 mg at 10/08/23 9093   multivitamin with minerals tablet 1 tablet  1 tablet Oral Daily Lou Claretta HERO, MD   1 tablet at 10/08/23 9093   ondansetron  (ZOFRAN ) tablet 4 mg  4 mg Oral Q6H PRN Lou Claretta HERO, MD       Or   ondansetron  (ZOFRAN ) injection 4 mg  4 mg Intravenous Q6H PRN Amponsah, Prosper M, MD       pantoprazole  (PROTONIX ) EC tablet 40 mg  40 mg Oral BID Amponsah, Prosper M, MD   40 mg at 10/08/23 9092   rosuvastatin  (CRESTOR ) tablet 5 mg  5 mg Oral QHS Amponsah, Prosper M, MD   5 mg at 10/07/23 2351   Allergies as of 10/07/2023 - Review Complete 10/07/2023  Allergen Reaction Noted   Ivp dye [iodinated contrast media] Hives and Other (See Comments) 03/07/2012   Penicillins Anaphylaxis 09/12/2011   Sulfonamide  derivatives Other (See Comments) 09/12/2011   Tessalon [benzonatate] Hives  and Rash 04/03/2015   Sulfa antibiotics Other (See Comments) 10/16/2014   Rosuvastatin  calcium  Itching 03/17/2011   Onion Other (See Comments) 04/23/2012   Family History  Problem Relation Age of Onset   Congestive Heart Failure Mother        died 37   Kidney Stones Mother    Deafness Mother    Atrial fibrillation Mother    Congestive Heart Failure Father    Deafness Father    Healthy Sister    Healthy Sister    Healthy Sister    Congestive Heart Failure Brother    Atrial fibrillation Brother    Tuberculosis Paternal Grandfather    Migraines Neg Hx    Neural tube defect Neg Hx    Social History   Socioeconomic History   Marital status: Divorced    Spouse name: Not on file   Number of children: 3   Years of education: college   Highest education level: Not on file  Occupational History   Occupation: retired    Comment: Airline pilot  Tobacco Use   Smoking status: Never   Smokeless tobacco: Never  Vaping Use   Vaping status: Never Used  Substance and Sexual Activity   Alcohol use: Not Currently    Comment: rare   Drug use: No   Sexual activity: Not Currently  Other Topics Concern   Not on file  Social History Narrative   Divorced, 3 children, 9 grandchildren. Did work for [employer]. Retired at 78 y/o .   Social Drivers of Corporate investment banker Strain: Not on file  Food Insecurity: No Food Insecurity (10/07/2023)   Hunger Vital Sign    Worried About Running Out of Food in the Last Year: Never true    Ran Out of Food in the Last Year: Never true  Transportation Needs: No Transportation Needs (10/07/2023)   PRAPARE - Administrator, Civil Service (Medical): No    Lack of Transportation (Non-Medical): No  Physical Activity: Not on file  Stress: Not on file  Social Connections: Unknown (10/07/2023)   Social Connection and Isolation Panel    Frequency of Communication  with Friends and Family: More than three times a week    Frequency of Social Gatherings with Friends and Family: More than three times a week    Attends Religious Services: Patient declined    Database administrator or Organizations: Patient declined    Attends Banker Meetings: Patient declined    Marital Status: Patient declined  Intimate Partner Violence: Not At Risk (10/07/2023)   Humiliation, Afraid, Rape, and Kick questionnaire    Fear of Current or Ex-Partner: No    Emotionally Abused: No    Physically Abused: No    Sexually Abused: No   Review of Systems:  Review of Systems  Constitutional:  Positive for weight loss. Negative for chills and fever.  Respiratory:  Negative for shortness of breath.   Cardiovascular:  Negative for chest pain.  Gastrointestinal:  Positive for abdominal pain and diarrhea. Negative for blood in stool, nausea and vomiting.    OBJECTIVE:   Temp:  [97.5 F (36.4 C)-97.9 F (36.6 C)] 97.5 F (36.4 C) (06/29 1155) Pulse Rate:  [67-88] 73 (06/29 1155) Resp:  [16-20] 18 (06/29 1155) BP: (88-132)/(47-79) 106/59 (06/29 1155) SpO2:  [95 %-99 %] 97 % (06/29 1155) Weight:  [77.6 kg] 77.6 kg (06/28 2217) Last BM Date : 10/07/23 Physical Exam Constitutional:      General:  She is not in acute distress.    Appearance: She is not ill-appearing, toxic-appearing or diaphoretic.   Cardiovascular:     Rate and Rhythm: Normal rate and regular rhythm.  Pulmonary:     Effort: No respiratory distress.     Breath sounds: Normal breath sounds.  Abdominal:     General: There is no distension.     Palpations: Abdomen is soft.     Tenderness: There is no abdominal tenderness. There is no guarding.   Skin:    General: Skin is warm and dry.   Neurological:     Mental Status: She is alert.     Labs: Recent Labs    10/07/23 1024 10/08/23 0455  WBC 10.4 8.5  HGB 15.0 12.9  HCT 43.5 38.9  PLT 261 213   BMET Recent Labs    10/07/23 1236  10/08/23 0455  NA 137 137  K 3.5 3.1*  CL 103 105  CO2 23 24  GLUCOSE 109* 95  BUN 22 17  CREATININE 0.78 0.75  CALCIUM  9.6 8.8*   LFT Recent Labs    10/08/23 0455  PROT 6.0*  ALBUMIN  3.4*  AST 14*  ALT 9  ALKPHOS 62  BILITOT 0.8   PT/INR No results for input(s): LABPROT, INR in the last 72 hours.  Diagnostic imaging: No results found.  IMPRESSION: Acute diarrhea Status post cholecystectomy Heart failure reduced ejection fraction Atrial fibrillation on Eliquis   PLAN: - Follow-up results of GI pathogen panel, her symptoms sound most similar to a viral gastroenteritis at this time - If GI pathogen panel is negative, will trial bile acid binding resin, colestipol 2 g p.o. daily - If bile acid binding resin does not improve symptoms, will consider colonoscopy with biopsies for microscopic colitis - In meantime, ideally would avoid dairy products and artificial sweeteners - Patient had me speak with her granddaughter who is a gastroenterologist in [place]  over the telephone to provide update, all questions answered - [facility] will follow   LOS: 0 days   Estefana Keas, [facility] Gastroenterology

## 2023-10-08 NOTE — Significant Event (Signed)
 Called this afternoon for chest pain.  Seen and examined by me. Elderly Caucasian female.  Not in distress at the time of my evaluation.  She describes that she had a little bit of chest discomfort earlier and some shortness of breath.  No symptoms at the time of my evaluation. On exam, S1-S2 heard, no murmur, clear to auscultation bilaterally.   She had 5-7 bowel movements during the day today. Currently on 50 mL/h of normal saline Most recent echo from February 2024 with EF 30 to 35% has a biventricular pacemaker in place. During the day today, blood pressure has been running in low normal range and her blood pressure medicines including Coreg , Entresto , diuretics are appropriately on hold. EKG with paced rhythm, no significant ST-T wave changes Troponin drawn  Given low blood pressure, there is a chance she could have decreased coronary perfusion. Denies any history of ischemic heart disease.  Coronary CT from 2022 with calcium  score of 0. I will continue IV fluid for now.  Continue to hold cardiac meds.  Troponin pending Transfer to telemetry unit To be followed by night team.   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  I think she might have been a little anxious as well earlier.  Currently continued on Lexapro . Also to continue Protonix  Communicated with RN. Communicated with primary attending Dr. Jillian.

## 2023-10-08 NOTE — Progress Notes (Signed)
 Mobility Specialist - Progress Note   10/08/23 0902  Mobility  Activity Ambulated independently in hallway  Level of Assistance Independent  Assistive Device None  Distance Ambulated (ft) 350 ft  Activity Response Tolerated well  Mobility Referral Yes  Mobility visit 1 Mobility  Mobility Specialist Start Time (ACUTE ONLY) 0851  Mobility Specialist Stop Time (ACUTE ONLY) 0901  Mobility Specialist Time Calculation (min) (ACUTE ONLY) 10 min   Pt received in bed and agreeable to mobility. No complaints during session. Pt to bed after session with all needs met.    [facility]

## 2023-10-08 NOTE — Progress Notes (Addendum)
 PROGRESS NOTE  Paula Booker  FMW:[id number] DOB: 04-02-1946 DOA: 10/07/2023 PCP: Frederik Charleston, MD   Brief Narrative: Patient is a 78 year old female with history of asthma, A-fib on Eliquis , anxiety, depression, HFrEF status post CRT-D, hypertension, hyperlipidemia, GERD who presented for the evaluation of persistent diarrhea that started about a week ago.  Diarrhea was nonbloody up to 10 times a day.  Also reported lightheadedness, mild abdominal cramping.  Tried Imodium  4 times a day without improvement.  On presentation, she was mildly hypertensive otherwise hemodynamically stable.  Labs stable.  Started on gentle IV fluids.  GI pathogen panel sent.  GI also consulted.  Assessment & Plan:  Principal Problem:   Hypotension Active Problems:   Dehydration   Orthostatic dizziness  Gastroenteritis/diarrhea: Presented with persistent diarrhea for a week without fever or chills.  Also reported abdominal cramping.  Was mildly hypotensive on presentation.  Follow-up with GI pathogen panel. She never had a colonoscopy before.  We also consulted GI.  Hypotension: Given gentle IV.  Currently blood pressure stable  History of ischemic cardiomyopathy/HFrEF: Status post ICD.  On Coreg , Entresto , spironolactone , Lasix  at home.  These are on hold.  Patient was dehydrated on presentation.  Paroxysmal A-fib: On ICD.  Currently in paced rhythm.  On Eliquis  for anticoagulation.  Coreg  on hold  Asthma: Breo Ellipta .  Continue bronchodilators as needed  Hyperlipidemia: On statin  Anxiety/depression: On Lexapro   Hypokalemia: Supplemented with potassium  GERD: Continue Protonix         DVT prophylaxis: apixaban  (ELIQUIS ) tablet 5 mg     Code Status: Full Code  Family Communication: Called granddaughter Neha for update for 6/29,call not received  Patient status:Obs  Patient is from :Home  Anticipated discharge un:Ynfz  Estimated DC date:1-2 days, after full workup   Consultants:  GI  Procedures: None  Antimicrobials:  Anti-infectives (From admission, onward)    None       Subjective: Patient seen and examined at bedside today.  She looks overall comfortable.  Lying on bed.  Denies any significant abdominal pain.  She is very concerned with the persistent diarrhea.  She has some mild cramping which is generalized.  No nausea or vomiting.  Objective: Vitals:   10/07/23 2217 10/08/23 0028 10/08/23 0504 10/08/23 0803  BP: (!) 102/54 (!) 106/54 (!) 118/47   Pulse: 74 76 70   Resp: 18 18 20    Temp: 97.9 F (36.6 C) (!) 97.5 F (36.4 C) 97.6 F (36.4 C)   TempSrc: Oral     SpO2:  95% 97% 95%  Weight: 77.6 kg     Height: 5' 4 (1.626 m)       Intake/Output Summary (Last 24 hours) at 10/08/2023 0808 Last data filed at 10/08/2023 0300 Gross per 24 hour  Intake 144.6 ml  Output --  Net 144.6 ml   Filed Weights   10/07/23 2217  Weight: 77.6 kg    Examination:  General exam: Overall comfortable, not in distress, pleasant female HEENT: PERRL Respiratory system:  no wheezes or crackles  Cardiovascular system: S1 & S2 heard, RRR.  ICD Gastrointestinal system: Abdomen is nondistended, soft and mostly nontender. Central nervous system: Alert and oriented Extremities: No edema, no clubbing ,no cyanosis Skin: No rashes, no ulcers,no icterus     Data Reviewed: I have personally reviewed following labs and imaging studies  CBC: Recent Labs  Lab 10/07/23 1024 10/08/23 0455  WBC 10.4 8.5  HGB 15.0 12.9  HCT 43.5 38.9  MCV 91.4 94.4  PLT 261 213   Basic Metabolic Panel: Recent Labs  Lab 10/07/23 1236 10/08/23 0455  NA 137 137  K 3.5 3.1*  CL 103 105  CO2 23 24  GLUCOSE 109* 95  BUN 22 17  CREATININE 0.78 0.75  CALCIUM  9.6 8.8*  MG 2.0  --      No results found for this or any previous visit (from the past 240 hours).   Radiology Studies: No results found.  Scheduled Meds:  acetaminophen   500 mg Oral BID   apixaban   5 mg Oral  BID   calcium -vitamin D   1 tablet Oral Q breakfast   cholecalciferol   5,000 Units Oral Daily   cyanocobalamin   5,000 mcg Oral Daily   escitalopram   20 mg Oral Daily   fluticasone  furoate-vilanterol  1 puff Inhalation Daily   loratadine   10 mg Oral Daily   multivitamin with minerals  1 tablet Oral Daily   pantoprazole   40 mg Oral BID   potassium chloride   60 mEq Oral Once   rosuvastatin   5 mg Oral QHS   Continuous Infusions:  lactated ringers  125 mL/hr at 10/08/23 0150     LOS: 0 days   Ivonne Mustache, MD [facility] P6/29/2025, 8:08 AM

## 2023-10-09 ENCOUNTER — Observation Stay (HOSPITAL_COMMUNITY)

## 2023-10-09 DIAGNOSIS — R197 Diarrhea, unspecified: Secondary | ICD-10-CM | POA: Diagnosis not present

## 2023-10-09 DIAGNOSIS — E861 Hypovolemia: Secondary | ICD-10-CM | POA: Diagnosis not present

## 2023-10-09 DIAGNOSIS — R42 Dizziness and giddiness: Secondary | ICD-10-CM | POA: Diagnosis not present

## 2023-10-09 DIAGNOSIS — R9431 Abnormal electrocardiogram [ECG] [EKG]: Secondary | ICD-10-CM

## 2023-10-09 LAB — BASIC METABOLIC PANEL WITH GFR
Anion gap: 6 (ref 5–15)
BUN: 11 mg/dL (ref 8–23)
CO2: 24 mmol/L (ref 22–32)
Calcium: 9.1 mg/dL (ref 8.9–10.3)
Chloride: 110 mmol/L (ref 98–111)
Creatinine, Ser: 0.35 mg/dL — ABNORMAL LOW (ref 0.44–1.00)
GFR, Estimated: >60 mL/min (ref >60)
Glucose, Bld: 110 mg/dL — ABNORMAL HIGH (ref 70–99)
Potassium: 3.7 mmol/L (ref 3.5–5.1)
Sodium: 140 mmol/L (ref 135–145)

## 2023-10-09 LAB — GASTROINTESTINAL PANEL BY PCR, STOOL (REPLACES STOOL CULTURE)

## 2023-10-09 LAB — ECHOCARDIOGRAM COMPLETE
AR max vel: 2.7 cm2
AV Area VTI: 2.5 cm2
AV Area mean vel: 2.32 cm2
AV Mean grad: 4 mmHg
AV Peak grad: 7.2 mmHg
Ao pk vel: 1.34 m/s
Area-P 1/2: 2.43 cm2
Calc EF: 50.7 %
Height: 64 in
S' Lateral: 4 cm
Single Plane A2C EF: 52.5 %
Single Plane A4C EF: 53.4 %
Weight: 2736 [oz_av]

## 2023-10-09 LAB — C DIFFICILE QUICK SCREEN W PCR REFLEX
C Diff antigen: NEGATIVE
C Diff interpretation: NOT DETECTED
C Diff toxin: NEGATIVE

## 2023-10-09 MED ORDER — COLESTIPOL HCL 1 G PO TABS
2.0000 g | ORAL_TABLET | Freq: Every day | ORAL | Status: DC
  Administered 2023-10-09 – 2023-10-10 (×2): 2 g via ORAL
  Filled 2023-10-09 (×2): qty 2

## 2023-10-09 MED ORDER — PERFLUTREN LIPID MICROSPHERE
1.0000 mL | INTRAVENOUS | Status: AC | PRN
  Administered 2023-10-09: 2 mL via INTRAVENOUS

## 2023-10-09 MED ORDER — ALUM & MAG HYDROXIDE-SIMETH 200-200-20 MG/5ML PO SUSP
30.0000 mL | Freq: Four times a day (QID) | ORAL | Status: DC | PRN
  Administered 2023-10-09: 30 mL via ORAL
  Filled 2023-10-09: qty 30

## 2023-10-09 NOTE — Progress Notes (Signed)
 [facility] Progress Note  SUBJECTIVE:   Interval history: Paula Booker was seen and evaluated today at bedside. Resting in bed. Had muffin, subsequently had liquid diarrhea. No abdominal pain. No chest pain or shortness of breath. GI pathogen panel negative, c.diff not included in this testing, will add.   Past Medical History:  Diagnosis Date   AICD (automatic cardioverter/defibrillator) present    Anemia    as a child   Anxiety    Arthritis    hands and knees (03/22/2012)   Asthma    Basal cell carcinoma 04/27/2011   mid forehead(MOHS)   Bursitis    CHF (congestive heart failure) (HCC) 09/2011   pt. denies   Depression    Dysrhythmia    WPW   GERD (gastroesophageal reflux disease)    Head injury, acute, with loss of consciousness (HCC)    History of hiatal hernia    Hypertension    ICD (implantable cardiac defibrillator) in place 03/2012   Migraines    migraines    Nausea vomiting and diarrhea 03/18/2015   NICM (nonischemic cardiomyopathy) (HCC)    Obesity (BMI 30-39.9)    negative sleep study 2014   OSA (obstructive sleep apnea) 05/27/2018   Mild with AHI 6/hr    no cpap   Pneumonia    hx   Shortness of breath    @ any time I can get SOB (03/22/2012)   Skin cancer 1999   Squamous cell carcinoma of skin 07/08/2020   in situ right malar cheek-[id number]   Sudden arrhythmia death syndrome    WPW (Wolff-Parkinson-White syndrome)    Past Surgical History:  Procedure Laterality Date   BIOPSY  09/07/2021   Procedure: BIOPSY;  Surgeon: Elicia Claw, MD;  Location: [facility];  Service: Gastroenterology;;   JUVENTINO LLANO N/A 05/16/2022   Procedure: BIV LLANO;  Surgeon: Waddell Danelle ORN, MD;  Location: [facility];  Service: Cardiovascular;  Laterality: N/A;   CARDIAC CATHETERIZATION  09/18/11   no significant CAD   CARDIAC DEFIBRILLATOR PLACEMENT  03/22/2012   DDD   CHOLECYSTECTOMY  ~ 2003   ESOPHAGOGASTRODUODENOSCOPY (EGD) WITH PROPOFOL  N/A  09/07/2021   Procedure: ESOPHAGOGASTRODUODENOSCOPY (EGD) WITH PROPOFOL ;  Surgeon: Elicia Claw, MD;  Location: [facility];  Service: Gastroenterology;  Laterality: N/A;   IMPLANTABLE CARDIOVERTER DEFIBRILLATOR IMPLANT N/A 03/22/2012   Procedure: IMPLANTABLE CARDIOVERTER DEFIBRILLATOR IMPLANT;  Surgeon: Danelle ORN Waddell, MD;  Location: [facility] C[facility];  Service: Cardiovascular;  Laterality: N/A;   KNEE ARTHROSCOPY Left 11/29/2017   Procedure: ARTHROSCOPY LEFT KNEE;  Surgeon: Yvone Rush, MD;  Location: [facility];  Service: Orthopedics;  Laterality: Left;   KNEE ARTHROSCOPY Right 08/09/2019   Procedure: ARTHROSCOPY RIGHT KNEE, CHONDROPLASTY,  PARTIAL MEDIAL  MENSICECTOMY;  Surgeon: Yvone Rush, MD;  Location: [facility];  Service: Orthopedics;  Laterality: Right;   KYPHOPLASTY N/A 11/27/2013   Procedure: KYPHOPLASTY;  Surgeon: Oneil Rodgers Priestly, MD;  Location: [facility] OR;  Service: Orthopedics;  Laterality: N/A;  T9, T11 kyphoplasty   KYPHOPLASTY N/A 06/05/2014   Procedure: KYPHOPLASTY;  Surgeon: Oneil Rodgers Priestly, MD;  Location: [facility] OR;  Service: Orthopedics;  Laterality: N/A;  T7 kyphoplasty   MOHS SURGERY  06/2011   forehead (03/22/2012)   SKIN CANCER EXCISION  1999   top of my head and my right back shoulder   SKIN CANCER EXCISION     back   SKIN CANCER EXCISION     face   SUPRAVENTRICULAR TACHYCARDIA ABLATION  03/22/2012   unable  to induce VT/RN report 03/22/2012   SUPRAVENTRICULAR TACHYCARDIA ABLATION N/A 03/22/2012   Procedure: SUPRAVENTRICULAR TACHYCARDIA ABLATION;  Surgeon: Danelle LELON Birmingham, MD;  Location: [facility] CATH LAB;  Service: Cardiovascular;  Laterality: N/A;   TONSILLECTOMY AND ADENOIDECTOMY  1950   TUBAL LIGATION  1978   Current Facility-Administered Medications  Medication Dose Route Frequency Provider Last Rate Last Admin   0.9 %  sodium chloride  infusion   Intravenous Continuous Adhikari, Amrit, MD 50 mL/hr at 10/09/23 0600 Infusion Verify at 10/09/23 0600   acetaminophen   (TYLENOL ) tablet 650 mg  650 mg Oral Q6H PRN Amponsah, Prosper M, MD       Or   acetaminophen  (TYLENOL ) suppository 650 mg  650 mg Rectal Q6H PRN Lou Claretta HERO, MD       acetaminophen  (TYLENOL ) tablet 500 mg  500 mg Oral BID Amponsah, Prosper M, MD   500 mg at 10/09/23 9062   alum & mag hydroxide-simeth (MAALOX/MYLANTA) 200-200-20 MG/5ML suspension 30 mL  30 mL Oral Q6H PRN Jillian Buttery, MD   30 mL at 10/09/23 0239   apixaban  (ELIQUIS ) tablet 5 mg  5 mg Oral BID Amponsah, Prosper M, MD   5 mg at 10/09/23 9061   calcium -vitamin D  (OSCAL WITH D) 500-5 MG-MCG per tablet 1 tablet  1 tablet Oral Q breakfast Lou Claretta HERO, MD   1 tablet at 10/09/23 9061   cholecalciferol  (VITAMIN D3) 25 MCG (1000 UNIT) tablet 5,000 Units  5,000 Units Oral Daily Amponsah, Prosper M, MD   5,000 Units at 10/09/23 9061   colestipol (COLESTID) tablet 2 g  2 g Oral Daily Kriss Stagger H, DO       cyanocobalamin  (VITAMIN B12) tablet 5,000 mcg  5,000 mcg Oral Daily Lou Claretta HERO, MD   5,000 mcg at 10/09/23 0937   escitalopram  (LEXAPRO ) tablet 20 mg  20 mg Oral Daily Amponsah, Prosper M, MD   20 mg at 10/09/23 9062   fluticasone  furoate-vilanterol (BREO ELLIPTA ) 200-25 MCG/ACT 1 puff  1 puff Inhalation Daily Lou Claretta HERO, MD   1 puff at 10/09/23 0800   ipratropium-albuterol  (DUONEB) 0.5-2.5 (3) MG/3ML nebulizer solution 3 mL  3 mL Nebulization Q4H PRN Lou Claretta HERO, MD       loperamide  (IMODIUM ) capsule 4 mg  4 mg Oral BID PRN Lou Claretta HERO, MD       loratadine  (CLARITIN ) tablet 10 mg  10 mg Oral Daily Amponsah, Prosper M, MD   10 mg at 10/09/23 0937   multivitamin with minerals tablet 1 tablet  1 tablet Oral Daily Lou Claretta HERO, MD   1 tablet at 10/09/23 9062   ondansetron  (ZOFRAN ) tablet 4 mg  4 mg Oral Q6H PRN Amponsah, Prosper M, MD       Or   ondansetron  (ZOFRAN ) injection 4 mg  4 mg Intravenous Q6H PRN Amponsah, Prosper M, MD       pantoprazole  (PROTONIX ) EC tablet 40 mg  40  mg Oral BID Amponsah, Prosper M, MD   40 mg at 10/09/23 9062   rosuvastatin  (CRESTOR ) tablet 5 mg  5 mg Oral QHS Amponsah, Prosper M, MD   5 mg at 10/08/23 2139   Allergies as of 10/07/2023 - Review Complete 10/07/2023  Allergen Reaction Noted   Ivp dye [iodinated contrast media] Hives and Other (See Comments) 03/07/2012   Penicillins Anaphylaxis 09/12/2011   Sulfonamide derivatives Other (See Comments) 09/12/2011   Tessalon [benzonatate] Hives and Rash 04/03/2015   Sulfa antibiotics Other (See Comments)  10/16/2014   Rosuvastatin  calcium  Itching 03/17/2011   Onion Other (See Comments) 04/23/2012   Review of Systems:  Review of Systems  Respiratory:  Negative for shortness of breath.   Cardiovascular:  Negative for chest pain.  Gastrointestinal:  Positive for diarrhea. Negative for abdominal pain, blood in stool, nausea and vomiting.    OBJECTIVE:   Temp:  [97.5 F (36.4 C)-98 F (36.7 C)] 97.5 F (36.4 C) (06/30 0527) Pulse Rate:  [73-79] 77 (06/30 0527) Resp:  [15-20] 15 (06/30 0527) BP: (99-126)/(46-66) 112/66 (06/30 0527) SpO2:  [97 %-99 %] 98 % (06/30 0527) Last BM Date : 10/09/23 Physical Exam Constitutional:      General: She is not in acute distress.    Appearance: She is not ill-appearing, toxic-appearing or diaphoretic.   Cardiovascular:     Rate and Rhythm: Normal rate and regular rhythm.  Pulmonary:     Effort: No respiratory distress.     Breath sounds: Normal breath sounds.  Abdominal:     General: There is no distension.     Palpations: Abdomen is soft.     Tenderness: There is no abdominal tenderness. There is no guarding.   Musculoskeletal:     Right lower leg: No edema.     Left lower leg: No edema.   Skin:    General: Skin is warm and dry.   Neurological:     Mental Status: She is alert.     Labs: Recent Labs    10/07/23 1024 10/08/23 0455  WBC 10.4 8.5  HGB 15.0 12.9  HCT 43.5 38.9  PLT 261 213   BMET Recent Labs    10/07/23 1236  10/08/23 0455 10/09/23 0918  NA 137 137 140  K 3.5 3.1* 3.7  CL 103 105 110  CO2 23 24 24   GLUCOSE 109* 95 110*  BUN 22 17 11   CREATININE 0.78 0.75 0.35*  CALCIUM  9.6 8.8* 9.1   LFT Recent Labs    10/08/23 0455  PROT 6.0*  ALBUMIN  3.4*  AST 14*  ALT 9  ALKPHOS 62  BILITOT 0.8   PT/INR No results for input(s): LABPROT, INR in the last 72 hours. Diagnostic imaging: No results found.  IMPRESSION: Acute diarrhea, GI pathogen panel negative Status post cholecystectomy Heart failure reduced ejection fraction Atrial fibrillation on Eliquis    PLAN: - Check c.diff for completeness - Start Colestipol 2 gm PO daily  - Diet as tolerated - Monitor bowel movements - [facility] will follow   LOS: 0 days   Paula Booker, [facility]

## 2023-10-09 NOTE — Progress Notes (Signed)
 PROGRESS NOTE  Paula Booker  FMW:[id number] DOB: 01/08/1946 DOA: 10/07/2023 PCP: Frederik Charleston, MD   Brief Narrative: Patient is a 78 year old female with history of asthma, A-fib on Eliquis , anxiety, depression, HFrEF status post CRT-D, hypertension, hyperlipidemia, GERD who presented for the evaluation of persistent diarrhea that started about a week ago.  Diarrhea was nonbloody up to 10 times a day.  Also reported lightheadedness, mild abdominal cramping.  Tried Imodium  4 times a day without improvement.  On presentation, she was mildly hypertensive otherwise hemodynamically stable.  Labs stable.  Started on gentle IV fluids.  GI pathogen panel sent.  Result pending.  GI also consulted.  Assessment & Plan:  Principal Problem:   Hypotension Active Problems:   Dehydration   Orthostatic dizziness  Gastroenteritis/diarrhea: Presented with persistent diarrhea for a week without fever or chills.  Also reported abdominal cramping.  Was mildly hypotensive on presentation.  Follow-up with GI pathogen panel. She never had a colonoscopy before.  We also consulted GI and they are following.  Currently on colestipol.  Slight improvement in the consistency of the stool.    Hypotension: Given gentle IV.  Currently blood pressure stable  History of ischemic cardiomyopathy/HFrEF: Status post ICD.  On Coreg , Entresto , spironolactone , Lasix  at home.  These are on hold.  Patient was dehydrated on presentation. Had some chest pain on 6/29.  Troponins negative.  EKG did not show any ischemic changes.  Echo pending.  Paroxysmal A-fib: On ICD.  Currently in paced rhythm.  On Eliquis  for anticoagulation.  Coreg  on hold  Asthma: Breo Ellipta .  Continue bronchodilators as needed  Hyperlipidemia: On statin  Anxiety/depression: On Lexapro   Hypokalemia: Supplemented with potassium  GERD: Continue Protonix         DVT prophylaxis: apixaban  (ELIQUIS ) tablet 5 mg     Code Status: Full Code  Family  Communication: Called granddaughter Neha for update for 6/29,call not received.  GI updating the family  Patient status:Obs  Patient is from :Home  Anticipated discharge un:Ynfz  Estimated DC date:1-2 days, after full workup   Consultants: GI  Procedures: None  Antimicrobials:  Anti-infectives (From admission, onward)    None       Subjective: Patient seen and examined at bedside today.  She looks more comfortable than yesterday.  Denies any abdomen pain or chest pain today.  She already had 4 loose stools today but consistency has slightly improved.  Abdomen benign on examination.  Objective: Vitals:   10/08/23 1851 10/08/23 2035 10/09/23 0527 10/09/23 1227  BP: (!) 120/53 (!) 126/57 112/66 123/75  Pulse: 79 74 77 81  Resp: 20 18 15 17   Temp: 98 F (36.7 C)  (!) 97.5 F (36.4 C) 98 F (36.7 C)  TempSrc:      SpO2: 97% 97% 98% 97%  Weight:      Height:        Intake/Output Summary (Last 24 hours) at 10/09/2023 1255 Last data filed at 10/09/2023 0600 Gross per 24 hour  Intake 1461.85 ml  Output --  Net 1461.85 ml   Filed Weights   10/07/23 2217  Weight: 77.6 kg    Examination:  General exam: Overall comfortable, not in distress HEENT: PERRL Respiratory system:  no wheezes or crackles  Cardiovascular system: S1 & S2 heard, RRR. ICD Gastrointestinal system: Abdomen is nondistended, soft and nontender.  Hyperactive bowel sounds Central nervous system: Alert and oriented Extremities: No edema, no clubbing ,no cyanosis Skin: No rashes, no ulcers,no icterus  Data Reviewed: I have personally reviewed following labs and imaging studies  CBC: Recent Labs  Lab 10/07/23 1024 10/08/23 0455  WBC 10.4 8.5  HGB 15.0 12.9  HCT 43.5 38.9  MCV 91.4 94.4  PLT 261 213   Basic Metabolic Panel: Recent Labs  Lab 10/07/23 1236 10/08/23 0455 10/09/23 0918  NA 137 137 140  K 3.5 3.1* 3.7  CL 103 105 110  CO2 23 24 24   GLUCOSE 109* 95 110*  BUN 22 17  11   CREATININE 0.78 0.75 0.35*  CALCIUM  9.6 8.8* 9.1  MG 2.0  --   --      Recent Results (from the past 240 hours)  Gastrointestinal Panel by PCR , Stool     Status: None   Collection Time: 10/07/23  1:55 PM   Specimen: Stool  Result Value Ref Range Status   Campylobacter species NOT DETECTED NOT DETECTED Final   Plesimonas shigelloides NOT DETECTED NOT DETECTED Final   Salmonella species NOT DETECTED NOT DETECTED Final   Yersinia enterocolitica NOT DETECTED NOT DETECTED Final   Vibrio species NOT DETECTED NOT DETECTED Final   Vibrio cholerae NOT DETECTED NOT DETECTED Final   Enteroaggregative E coli (EAEC) NOT DETECTED NOT DETECTED Final   Enteropathogenic E coli (EPEC) NOT DETECTED NOT DETECTED Final   Enterotoxigenic E coli (ETEC) NOT DETECTED NOT DETECTED Final   Shiga like toxin producing E coli (STEC) NOT DETECTED NOT DETECTED Final   Shigella/Enteroinvasive E coli (EIEC) NOT DETECTED NOT DETECTED Final   Cryptosporidium NOT DETECTED NOT DETECTED Final   Cyclospora cayetanensis NOT DETECTED NOT DETECTED Final   Entamoeba histolytica NOT DETECTED NOT DETECTED Final   Giardia lamblia NOT DETECTED NOT DETECTED Final   Adenovirus F40/41 NOT DETECTED NOT DETECTED Final   Astrovirus NOT DETECTED NOT DETECTED Final   Norovirus GI/GII NOT DETECTED NOT DETECTED Final   Rotavirus A NOT DETECTED NOT DETECTED Final   Sapovirus (I, II, IV, and V) NOT DETECTED NOT DETECTED Final    Comment: Performed at [facility], [address], [place], [place] [address]     Radiology Studies: No results found.  Scheduled Meds:  acetaminophen   500 mg Oral BID   apixaban   5 mg Oral BID   calcium -vitamin D   1 tablet Oral Q breakfast   cholecalciferol   5,000 Units Oral Daily   colestipol  2 g Oral Daily   cyanocobalamin   5,000 mcg Oral Daily   escitalopram   20 mg Oral Daily   fluticasone  furoate-vilanterol  1 puff Inhalation Daily   loratadine   10 mg Oral Daily   multivitamin with  minerals  1 tablet Oral Daily   pantoprazole   40 mg Oral BID   rosuvastatin   5 mg Oral QHS   Continuous Infusions:  sodium chloride  50 mL/hr at 10/09/23 0600     LOS: 0 days   Ivonne Mustache, MD [facility] P6/30/2025, 12:55 PM

## 2023-10-09 NOTE — Progress Notes (Signed)
   10/09/23 0944  TOC Brief Assessment  Insurance and Status Reviewed  Patient has primary care physician Yes  Home environment has been reviewed home alone  Prior level of function: independent  Prior/Current Home Services No current home services  Social Drivers of Health Review SDOH reviewed no interventions necessary  Readmission risk has been reviewed Yes  Transition of care needs no transition of care needs at this time

## 2023-10-09 NOTE — Progress Notes (Signed)
 Mobility Specialist - Progress Note   10/09/23 1200  Mobility  Activity Ambulated with assistance in hallway  Level of Assistance Independent after set-up  Assistive Device Other (Comment) (IV pole)  Distance Ambulated (ft) 500 ft  Range of Motion/Exercises Active  Activity Response Tolerated well  Mobility Referral Yes  Mobility visit 1 Mobility  Mobility Specialist Start Time (ACUTE ONLY) 1107  Mobility Specialist Stop Time (ACUTE ONLY) 1120  Mobility Specialist Time Calculation (min) (ACUTE ONLY) 13 min   Received in bed and agreed to mobility. No issues throughout session. Returned to bed with all needs met.  Cyndee Ada Mobility Specialist

## 2023-10-09 NOTE — Plan of Care (Signed)

## 2023-10-09 NOTE — Progress Notes (Signed)
*  PRELIMINARY RESULTS* Echocardiogram 2D Echocardiogram has been performed.  Eva JONETTA Lash 10/09/2023, 2:48 PM

## 2023-10-10 ENCOUNTER — Other Ambulatory Visit (HOSPITAL_COMMUNITY): Payer: Self-pay

## 2023-10-10 DIAGNOSIS — E861 Hypovolemia: Secondary | ICD-10-CM | POA: Diagnosis not present

## 2023-10-10 DIAGNOSIS — R42 Dizziness and giddiness: Secondary | ICD-10-CM | POA: Diagnosis not present

## 2023-10-10 DIAGNOSIS — R197 Diarrhea, unspecified: Secondary | ICD-10-CM | POA: Diagnosis not present

## 2023-10-10 MED ORDER — COLESTIPOL HCL 1 G PO TABS
2.0000 g | ORAL_TABLET | Freq: Every day | ORAL | 0 refills | Status: AC
  Filled 2023-10-10: qty 60, 30d supply, fill #0

## 2023-10-10 NOTE — Plan of Care (Signed)
   Problem: Clinical Measurements: Goal: Diagnostic test results will improve Outcome: Progressing Goal: Respiratory complications will improve Outcome: Progressing Goal: Cardiovascular complication will be avoided Outcome: Progressing   Problem: Elimination: Goal: Will not experience complications related to bowel motility Outcome: Progressing

## 2023-10-10 NOTE — Discharge Summary (Signed)
 Physician Discharge Summary  Paula Booker FMW:[id number] DOB: 1946/02/11 DOA: 10/07/2023  PCP: Frederik Charleston, MD  Admit date: 10/07/2023 Discharge date: 10/10/2023  Admitted From: Home Disposition:  Home  Discharge Condition:Stable CODE STATUS:FULL Diet recommendation: Heart Healthy  Brief/Interim Summary: Patient is a 78 year old female with history of asthma, A-fib on Eliquis , anxiety, depression, HFrEF status post CRT-D, hypertension, hyperlipidemia, GERD who presented for the evaluation of persistent diarrhea that started about a week ago.  Diarrhea was nonbloody up to 10 times a day.  Also reported lightheadedness, mild abdominal cramping.  Tried Imodium  4 times a day without improvement.  On presentation, she was mildly hypertensive otherwise hemodynamically stable.  Labs stable.  Started on gentle IV fluids.  GI pathogen panel/C. difficile negative.  GI was following.  Stool consistency has slightly improved as well as frequency.  GI cleared for discharge.  She will follow-up with her gastroenterologist as an outpatient  Following problems were addressed during the hospitalization:  Diarrhea: Presented with persistent diarrhea for a week without fever or chills.  Also reported abdominal cramping.  Was mildly hypotensive on presentation. Currently on colestipol.  Improvement in the consistency of the stool.  C. difficile/GI pathogen panel negative   Hypotension: Given gentle IV.  Currently blood pressure stable but on the lower side   History of ischemic cardiomyopathy/HFrEF: Status post ICD.  On Coreg , Entresto , spironolactone , Lasix  at home.  These are on hold.   Had some chest pain on 6/29.  Troponins negative.  EKG did not show any ischemic changes.  Echo showed EF of 40 to 45%, not a new finding.  Chest pain had resolved.  Will hold Coreg , Entresto , spironolactone  for next few days.  I have instructed the patient to monitor her blood pressure at home and follow-up with her PCP  next week   Paroxysmal A-fib: On ICD.  Currently in paced rhythm.  On Eliquis  for anticoagulation.  Coreg  on hold   Asthma: Breo Ellipta  at home   Hyperlipidemia: On statin   Anxiety/depression: On Lexapro    Hypokalemia: Supplemented with potassium   GERD: Continue Protonix   Discharge Diagnoses:  Principal Problem:   Hypotension Active Problems:   Dehydration   Orthostatic dizziness    Discharge Instructions  Discharge Instructions     Diet - low sodium heart healthy   Complete by: As directed    Discharge instructions   Complete by: As directed    1)Please take prescribed medications as instructed 2)Follow up with your PCP next week.  We have held Coreg , spironolactone , Entresto  because your blood pressure is on the lower side  Monitor blood pressure at home.  If your blood pressure starts trending up, you can resume this medications.  But discuss with your PCP first 3)Follow up with your gastroenterologist as an outpatient   Increase activity slowly   Complete by: As directed       Allergies as of 10/10/2023       Reactions   Ivp Dye [iodinated Contrast Media] Hives, Other (See Comments)   pt tolerates with benadryl    Penicillins Anaphylaxis   Has patient had a PCN reaction causing immediate rash, facial/tongue/throat swelling, SOB or lightheadedness with hypotension: Yes Has patient had a PCN reaction causing severe rash involving mucus membranes or skin necrosis: No Has patient had a PCN reaction that required hospitalization No Has patient had a PCN reaction occurring within the last 10 years: No If all of the above answers are NO, then may proceed with Cephalosporin use.  Sulfonamide Derivatives Other (See Comments)   Migraines   Tessalon [benzonatate] Hives, Rash   Severe rash   Sulfa Antibiotics Other (See Comments)   Migraines   Rosuvastatin  Calcium  Itching        Medication List     PAUSE taking these medications    carvedilol  6.25 MG  tablet Wait to take this until: October 16, 2023 Commonly known as: COREG  Take 1 tablet by mouth twice daily   Entresto  24-26 MG Wait to take this until: October 16, 2023 Generic drug: sacubitril-valsartan Take 1 tablet by mouth twice daily   spironolactone  25 MG tablet Wait to take this until: October 16, 2023 Commonly known as: ALDACTONE  Take 1 tablet (25 mg total) by mouth daily.       STOP taking these medications    methocarbamol  500 MG tablet Commonly known as: ROBAXIN        TAKE these medications    acetaminophen  500 MG tablet Commonly known as: TYLENOL  Take 500 mg by mouth in the morning and at bedtime.   albuterol  108 (90 Base) MCG/ACT inhaler Commonly known as: VENTOLIN  HFA INHALE 2 PUFFS BY MOUTH EVERY 6 HOURS AS NEEDED FOR WHEEZING OR SHORTNESS OF BREATH   Breo Ellipta  200-25 MCG/ACT Aepb Generic drug: fluticasone  furoate-vilanterol Inhale 1 puff by mouth once daily   Calcium  Carb-Cholecalciferol  600-20 MG-MCG Tabs Take 1 tablet by mouth in the morning.   cetirizine 10 MG tablet Commonly known as: ZYRTEC Take 10 mg by mouth in the morning.   colestipol 1 g tablet Commonly known as: COLESTID Take 2 tablets (2 g total) by mouth daily.   COLLAGEN PLUS VITAMIN C PO Take 1 tablet by mouth in the morning.   Eliquis  5 MG Tabs tablet Generic drug: apixaban  Take 1 tablet by mouth twice daily   escitalopram  20 MG tablet Commonly known as: LEXAPRO  Take 20 mg by mouth in the morning.   furosemide  40 MG tablet Commonly known as: LASIX  TAKE 1 TO 2 TABLETS BY MOUTH AS DIRECTED ONCE DAILY FOR  EDEMA,  SHORTNESS  OF  BREATH  OR  WEIGHT  GAIN.   ipratropium-albuterol  0.5-2.5 (3) MG/3ML Soln Commonly known as: DUONEB Take 3 mLs by nebulization every 4 (four) hours as needed.   Mounjaro 2.5 MG/0.5ML Pen Generic drug: tirzepatide Inject 2.5 mg into the skin once a week.   multivitamin with minerals Tabs tablet Take 1 tablet by mouth in the morning.    pantoprazole  40 MG tablet Commonly known as: PROTONIX  Take 40 mg by mouth 2 (two) times daily.   rosuvastatin  5 MG tablet Commonly known as: CRESTOR  Take 5 mg by mouth at bedtime.   Vitamin B-12 5000 MCG Lozg Take 5,000 mcg by mouth in the morning.   Vitamin D3 125 MCG (5000 UT) Caps Take 5,000 Units by mouth in the morning.   Zinc  100 MG Tabs Take 1 tablet by mouth daily at 6 (six) AM.        Follow-up Information     Frederik Charleston, MD. Schedule an appointment as soon as possible for a visit in 1 week(s).   Specialty: Family Medicine Contact information: [address] [place] [address] [phone]         Kristie Lamprey, MD. Schedule an appointment as soon as possible for a visit in 2 week(s).   Specialty: Gastroenterology Contact information: [address], [place] [address] [phone]  Allergies  Allergen Reactions   Ivp Dye [Iodinated Contrast Media] Hives and Other (See Comments)    pt tolerates with benadryl    Penicillins Anaphylaxis    Has patient had a PCN reaction causing immediate rash, facial/tongue/throat swelling, SOB or lightheadedness with hypotension: Yes Has patient had a PCN reaction causing severe rash involving mucus membranes or skin necrosis: No Has patient had a PCN reaction that required hospitalization No Has patient had a PCN reaction occurring within the last 10 years: No If all of the above answers are NO, then may proceed with Cephalosporin use.   Sulfonamide Derivatives Other (See Comments)    Migraines   Tessalon [Benzonatate] Hives and Rash    Severe rash   Sulfa Antibiotics Other (See Comments)    Migraines   Rosuvastatin  Calcium  Itching    Consultations: GI   Procedures/Studies: ECHOCARDIOGRAM COMPLETE Result Date: 10/09/2023    ECHOCARDIOGRAM REPORT   Patient Name:   LUNNA VOGELGESANG Date of Exam: 10/09/2023 Medical Rec #:  [id number]       Height:       64.0  in Accession #:    [id number]      Weight:       171.0 lb Date of Birth:  08-27-1945      BSA:          1.830 m Patient Age:    77 years        BP:           117/67 mmHg Patient Gender: F               HR:           73 bpm. Exam Location:  Inpatient Procedure: 2D Echo, Cardiac Doppler and Color Doppler (Both Spectral and Color            Flow Doppler were utilized during procedure). Indications:    R94.31 Abnormal EKG  History:        Patient has prior history of Echocardiogram examinations, most                 recent 05/20/2022.  Sonographer:    Eva Lash Referring Phys: [id number] Valerie Fredin IMPRESSIONS  1. Left ventricular ejection fraction, by estimation, is 40 to 45%. The left ventricle has mildly decreased function. The left ventricle has no regional wall motion abnormalities. Left ventricular diastolic parameters are consistent with Grade I diastolic dysfunction (impaired relaxation).  2. Right ventricular systolic function is normal. The right ventricular size is normal.  3. Left atrial size was mildly dilated.  4. The mitral valve is normal in structure. Mild mitral valve regurgitation. No evidence of mitral stenosis.  5. The aortic valve is normal in structure. Aortic valve regurgitation is not visualized. No aortic stenosis is present.  6. The inferior vena cava is normal in size with greater than 50% respiratory variability, suggesting right atrial pressure of 3 mmHg. FINDINGS  Left Ventricle: Left ventricular ejection fraction, by estimation, is 40 to 45%. The left ventricle has mildly decreased function. The left ventricle has no regional wall motion abnormalities. Definity contrast agent was given IV to delineate the left ventricular endocardial borders. The left ventricular internal cavity size was normal in size. There is no left ventricular hypertrophy. Left ventricular diastolic parameters are consistent with Grade I diastolic dysfunction (impaired relaxation). Right Ventricle: The right  ventricular size is normal. No increase in right ventricular wall thickness. Right ventricular systolic function is normal. Left Atrium: Left atrial  size was mildly dilated. Right Atrium: Right atrial size was normal in size. Pericardium: There is no evidence of pericardial effusion. Mitral Valve: The mitral valve is normal in structure. Mild mitral valve regurgitation. No evidence of mitral valve stenosis. Tricuspid Valve: The tricuspid valve is normal in structure. Tricuspid valve regurgitation is not demonstrated. No evidence of tricuspid stenosis. Aortic Valve: The aortic valve is normal in structure. Aortic valve regurgitation is not visualized. No aortic stenosis is present. Aortic valve mean gradient measures 4.0 mmHg. Aortic valve peak gradient measures 7.2 mmHg. Aortic valve area, by VTI measures 2.50 cm. Pulmonic Valve: The pulmonic valve was normal in structure. Pulmonic valve regurgitation is not visualized. No evidence of pulmonic stenosis. Aorta: The aortic root is normal in size and structure. Venous: The inferior vena cava is normal in size with greater than 50% respiratory variability, suggesting right atrial pressure of 3 mmHg. IAS/Shunts: No atrial level shunt detected by color flow Doppler.  LEFT VENTRICLE PLAX 2D LVIDd:         4.70 cm      Diastology LVIDs:         4.00 cm      LV e' medial:    7.29 cm/s LV PW:         1.30 cm      LV E/e' medial:  10.5 LV IVS:        1.10 cm      LV e' lateral:   10.10 cm/s LVOT diam:     2.00 cm      LV E/e' lateral: 7.6 LV SV:         77 LV SV Index:   42 LVOT Area:     3.14 cm  LV Volumes (MOD) LV vol d, MOD A2C: 130.0 ml LV vol d, MOD A4C: 132.0 ml LV vol s, MOD A2C: 61.7 ml LV vol s, MOD A4C: 61.5 ml LV SV MOD A2C:     68.3 ml LV SV MOD A4C:     132.0 ml LV SV MOD BP:      66.6 ml RIGHT VENTRICLE RV S prime:     15.90 cm/s TAPSE (M-mode): 2.1 cm LEFT ATRIUM             Index LA diam:        4.50 cm 2.46 cm/m LA Vol (A2C):   64.0 ml 34.97 ml/m LA Vol  (A4C):   69.1 ml 37.75 ml/m LA Biplane Vol: 67.1 ml 36.66 ml/m  AORTIC VALVE AV Area (Vmax):    2.70 cm AV Area (Vmean):   2.32 cm AV Area (VTI):     2.50 cm AV Vmax:           134.00 cm/s AV Vmean:          93.300 cm/s AV VTI:            0.309 m AV Peak Grad:      7.2 mmHg AV Mean Grad:      4.0 mmHg LVOT Vmax:         115.00 cm/s LVOT Vmean:        69.000 cm/s LVOT VTI:          0.246 m LVOT/AV VTI ratio: 0.80 MITRAL VALVE               TRICUSPID VALVE MV Area (PHT): 2.43 cm    TR Peak grad:   24.0 mmHg MV Decel Time: 312 msec    TR  Vmax:        245.00 cm/s MV E velocity: 76.30 cm/s MV A velocity: 99.80 cm/s  SHUNTS MV E/A ratio:  0.76        Systemic VTI:  0.25 m                            Systemic Diam: 2.00 cm Aditya Sabharwal Electronically signed by Ria Commander Signature Date/Time: 10/09/2023/3:14:37 PM    Final       Subjective: Patient seen and examined at bedside today.  Hemodynamically stable.  Overall comfortable.  Denies any significant abdominal pain.  No nausea or vomiting.  Stool consistency has improved and is slightly becoming semisolid.  Frequency has decreased.  She feels ready to go home today  Discharge Exam: Vitals:   10/10/23 0549 10/10/23 0807  BP: (!) 107/54   Pulse: 64   Resp: 16   Temp: 97.7 F (36.5 C)   SpO2: 97% 97%   Vitals:   10/09/23 1227 10/09/23 2106 10/10/23 0549 10/10/23 0807  BP: 123/75 (!) 106/55 (!) 107/54   Pulse: 81 72 64   Resp: 17 18 16    Temp: 98 F (36.7 C) 97.8 F (36.6 C) 97.7 F (36.5 C)   TempSrc:      SpO2: 97% 98% 97% 97%  Weight:      Height:        General: Pt is alert, awake, not in acute distress Cardiovascular: RRR, S1/S2 +, no rubs, no gallops Respiratory: CTA bilaterally, no wheezing, no rhonchi Abdominal: Soft, NT, ND, bowel sounds + Extremities: no edema, no cyanosis    The results of significant diagnostics from this hospitalization (including imaging, microbiology, ancillary and laboratory) are listed  below for reference.     Microbiology: Recent Results (from the past 240 hours)  Gastrointestinal Panel by PCR , Stool     Status: None   Collection Time: 10/07/23  1:55 PM   Specimen: Stool  Result Value Ref Range Status   Campylobacter species NOT DETECTED NOT DETECTED Final   Plesimonas shigelloides NOT DETECTED NOT DETECTED Final   Salmonella species NOT DETECTED NOT DETECTED Final   Yersinia enterocolitica NOT DETECTED NOT DETECTED Final   Vibrio species NOT DETECTED NOT DETECTED Final   Vibrio cholerae NOT DETECTED NOT DETECTED Final   Enteroaggregative E coli (EAEC) NOT DETECTED NOT DETECTED Final   Enteropathogenic E coli (EPEC) NOT DETECTED NOT DETECTED Final   Enterotoxigenic E coli (ETEC) NOT DETECTED NOT DETECTED Final   Shiga like toxin producing E coli (STEC) NOT DETECTED NOT DETECTED Final   Shigella/Enteroinvasive E coli (EIEC) NOT DETECTED NOT DETECTED Final   Cryptosporidium NOT DETECTED NOT DETECTED Final   Cyclospora cayetanensis NOT DETECTED NOT DETECTED Final   Entamoeba histolytica NOT DETECTED NOT DETECTED Final   Giardia lamblia NOT DETECTED NOT DETECTED Final   Adenovirus F40/41 NOT DETECTED NOT DETECTED Final   Astrovirus NOT DETECTED NOT DETECTED Final   Norovirus GI/GII NOT DETECTED NOT DETECTED Final   Rotavirus A NOT DETECTED NOT DETECTED Final   Sapovirus (I, II, IV, and V) NOT DETECTED NOT DETECTED Final    Comment: Performed at [facility], [address] [place], [place] [address]  C Difficile Quick Screen w PCR reflex     Status: None   Collection Time: 10/09/23 12:56 PM   Specimen: STOOL  Result Value Ref Range Status   C Diff antigen NEGATIVE NEGATIVE Final  C Diff toxin NEGATIVE NEGATIVE Final   C Diff interpretation No C. difficile detected.  Final    Comment: Performed at [facility], [address], [place], [place] [address]     Labs: BNP (last 3 results) Recent Labs    06/18/23 1530  BNP 42.8    Basic Metabolic Panel: Recent Labs  Lab 10/07/23 1236 10/08/23 0455 10/09/23 0918  NA 137 137 140  K 3.5 3.1* 3.7  CL 103 105 110  CO2 23 24 24   GLUCOSE 109* 95 110*  BUN 22 17 11   CREATININE 0.78 0.75 0.35*  CALCIUM  9.6 8.8* 9.1  MG 2.0  --   --    Liver Function Tests: Recent Labs  Lab 10/07/23 1236 10/08/23 0455  AST 19 14*  ALT 8 9  ALKPHOS 88 62  BILITOT 0.5 0.8  PROT 7.0 6.0*  ALBUMIN  4.1 3.4*   Recent Labs  Lab 10/07/23 1236  LIPASE 38   No results for input(s): AMMONIA in the last 168 hours. CBC: Recent Labs  Lab 10/07/23 1024 10/08/23 0455  WBC 10.4 8.5  HGB 15.0 12.9  HCT 43.5 38.9  MCV 91.4 94.4  PLT 261 213   Cardiac Enzymes: No results for input(s): CKTOTAL, CKMB, CKMBINDEX, TROPONINI in the last 168 hours. BNP: Invalid input(s): POCBNP CBG: No results for input(s): GLUCAP in the last 168 hours. D-Dimer No results for input(s): DDIMER in the last 72 hours. Hgb A1c No results for input(s): HGBA1C in the last 72 hours. Lipid Profile No results for input(s): CHOL, HDL, LDLCALC, TRIG, CHOLHDL, LDLDIRECT in the last 72 hours. Thyroid  function studies No results for input(s): TSH, T4TOTAL, T3FREE, THYROIDAB in the last 72 hours.  Invalid input(s): FREET3 Anemia work up No results for input(s): VITAMINB12, FOLATE, FERRITIN, TIBC, IRON, RETICCTPCT in the last 72 hours. Urinalysis    Component Value Date/Time   COLORURINE YELLOW 10/07/2023 1125   APPEARANCEUR CLEAR 10/07/2023 1125   LABSPEC <1.005 (L) 10/07/2023 1125   PHURINE 5.5 10/07/2023 1125   GLUCOSEU NEGATIVE 10/07/2023 1125   HGBUR TRACE (A) 10/07/2023 1125   BILIRUBINUR NEGATIVE 10/07/2023 1125   KETONESUR NEGATIVE 10/07/2023 1125   PROTEINUR NEGATIVE 10/07/2023 1125   UROBILINOGEN 0.2 06/03/2014 1608   NITRITE NEGATIVE 10/07/2023 1125   LEUKOCYTESUR NEGATIVE 10/07/2023 1125   Sepsis Labs Recent Labs  Lab  10/07/23 1024 10/08/23 0455  WBC 10.4 8.5   Microbiology Recent Results (from the past 240 hours)  Gastrointestinal Panel by PCR , Stool     Status: None   Collection Time: 10/07/23  1:55 PM   Specimen: Stool  Result Value Ref Range Status   Campylobacter species NOT DETECTED NOT DETECTED Final   Plesimonas shigelloides NOT DETECTED NOT DETECTED Final   Salmonella species NOT DETECTED NOT DETECTED Final   Yersinia enterocolitica NOT DETECTED NOT DETECTED Final   Vibrio species NOT DETECTED NOT DETECTED Final   Vibrio cholerae NOT DETECTED NOT DETECTED Final   Enteroaggregative E coli (EAEC) NOT DETECTED NOT DETECTED Final   Enteropathogenic E coli (EPEC) NOT DETECTED NOT DETECTED Final   Enterotoxigenic E coli (ETEC) NOT DETECTED NOT DETECTED Final   Shiga like toxin producing E coli (STEC) NOT DETECTED NOT DETECTED Final   Shigella/Enteroinvasive E coli (EIEC) NOT DETECTED NOT DETECTED Final   Cryptosporidium NOT DETECTED NOT DETECTED Final   Cyclospora cayetanensis NOT DETECTED NOT DETECTED Final   Entamoeba histolytica NOT DETECTED NOT DETECTED Final   Giardia lamblia NOT DETECTED  NOT DETECTED Final   Adenovirus F40/41 NOT DETECTED NOT DETECTED Final   Astrovirus NOT DETECTED NOT DETECTED Final   Norovirus GI/GII NOT DETECTED NOT DETECTED Final   Rotavirus A NOT DETECTED NOT DETECTED Final   Sapovirus (I, II, IV, and V) NOT DETECTED NOT DETECTED Final    Comment: Performed at [facility], [address] [place], [place] [address]  C Difficile Quick Screen w PCR reflex     Status: None   Collection Time: 10/09/23 12:56 PM   Specimen: STOOL  Result Value Ref Range Status   C Diff antigen NEGATIVE NEGATIVE Final   C Diff toxin NEGATIVE NEGATIVE Final   C Diff interpretation No C. difficile detected.  Final    Comment: Performed at [facility], [address], [place], [place] [address]    Please note: You were cared for by a hospitalist  during your hospital stay. Once you are discharged, your primary care physician will handle any further medical issues. Please note that NO REFILLS for any discharge medications will be authorized once you are discharged, as it is imperative that you return to your primary care physician (or establish a relationship with a primary care physician if you do not have one) for your post hospital discharge needs so that they can reassess your need for medications and monitor your lab values.    Time coordinating discharge: 40 minutes  SIGNED:   Ivonne Mustache, MD  [facility] 10/10/2023, 11:05 AM Pager [phone]  If 7PM-7AM, please contact night-coverage [email or web address] Password [email or web address]

## 2023-10-10 NOTE — Progress Notes (Signed)
 AVS reviewed w/ pt who verbalized an understanding. No other questions. TOC med in a secure bag delivered to patient by this RN. Pt dressed for d/c to home. PIV removed as noted. Pt to lobby via w/c - home w/ daughter.

## 2023-10-10 NOTE — Progress Notes (Signed)
 [facility] Progress Note  SUBJECTIVE:   Interval history: Paula Booker was seen and evaluated today at bedside.  Resting comfortably.  Noted that she has had some improvement in consistency of her bowel movements, more formed, though still soft.  No blood.  No abdominal pain.  She feels well enough to go home today.  Past Medical History:  Diagnosis Date   AICD (automatic cardioverter/defibrillator) present    Anemia    as a child   Anxiety    Arthritis    hands and knees (03/22/2012)   Asthma    Basal cell carcinoma 04/27/2011   mid forehead(MOHS)   Bursitis    CHF (congestive heart failure) (HCC) 09/2011   pt. denies   Depression    Dysrhythmia    WPW   GERD (gastroesophageal reflux disease)    Head injury, acute, with loss of consciousness (HCC)    History of hiatal hernia    Hypertension    ICD (implantable cardiac defibrillator) in place 03/2012   Migraines    migraines    Nausea vomiting and diarrhea 03/18/2015   NICM (nonischemic cardiomyopathy) (HCC)    Obesity (BMI 30-39.9)    negative sleep study 2014   OSA (obstructive sleep apnea) 05/27/2018   Mild with AHI 6/hr    no cpap   Pneumonia    hx   Shortness of breath    @ any time I can get SOB (03/22/2012)   Skin cancer 1999   Squamous cell carcinoma of skin 07/08/2020   in situ right malar cheek-[id number]   Sudden arrhythmia death syndrome    WPW (Wolff-Parkinson-White syndrome)    Past Surgical History:  Procedure Laterality Date   BIOPSY  09/07/2021   Procedure: BIOPSY;  Surgeon: Elicia Claw, MD;  Location: [facility];  Service: Gastroenterology;;   JUVENTINO LLANO N/A 05/16/2022   Procedure: BIV LLANO;  Surgeon: Waddell Danelle ORN, MD;  Location: [facility];  Service: Cardiovascular;  Laterality: N/A;   CARDIAC CATHETERIZATION  09/18/11   no significant CAD   CARDIAC DEFIBRILLATOR PLACEMENT  03/22/2012   DDD   CHOLECYSTECTOMY  ~ 2003   ESOPHAGOGASTRODUODENOSCOPY (EGD) WITH  PROPOFOL  N/A 09/07/2021   Procedure: ESOPHAGOGASTRODUODENOSCOPY (EGD) WITH PROPOFOL ;  Surgeon: Elicia Claw, MD;  Location: [facility];  Service: Gastroenterology;  Laterality: N/A;   IMPLANTABLE CARDIOVERTER DEFIBRILLATOR IMPLANT N/A 03/22/2012   Procedure: IMPLANTABLE CARDIOVERTER DEFIBRILLATOR IMPLANT;  Surgeon: Danelle ORN Waddell, MD;  Location: [facility] CATH LAB;  Service: Cardiovascular;  Laterality: N/A;   KNEE ARTHROSCOPY Left 11/29/2017   Procedure: ARTHROSCOPY LEFT KNEE;  Surgeon: Yvone Rush, MD;  Location: [facility];  Service: Orthopedics;  Laterality: Left;   KNEE ARTHROSCOPY Right 08/09/2019   Procedure: ARTHROSCOPY RIGHT KNEE, CHONDROPLASTY,  PARTIAL MEDIAL  MENSICECTOMY;  Surgeon: Yvone Rush, MD;  Location: [facility];  Service: Orthopedics;  Laterality: Right;   KYPHOPLASTY N/A 11/27/2013   Procedure: KYPHOPLASTY;  Surgeon: Oneil Rodgers Priestly, MD;  Location: [facility] OR;  Service: Orthopedics;  Laterality: N/A;  T9, T11 kyphoplasty   KYPHOPLASTY N/A 06/05/2014   Procedure: KYPHOPLASTY;  Surgeon: Oneil Rodgers Priestly, MD;  Location: [facility] OR;  Service: Orthopedics;  Laterality: N/A;  T7 kyphoplasty   MOHS SURGERY  06/2011   forehead (03/22/2012)   SKIN CANCER EXCISION  1999   top of my head and my right back shoulder   SKIN CANCER EXCISION     back   SKIN CANCER EXCISION     face   SUPRAVENTRICULAR TACHYCARDIA  ABLATION  03/22/2012   unable to induce VT/RN report 03/22/2012   SUPRAVENTRICULAR TACHYCARDIA ABLATION N/A 03/22/2012   Procedure: SUPRAVENTRICULAR TACHYCARDIA ABLATION;  Surgeon: Danelle LELON Birmingham, MD;  Location: [facility] CATH LAB;  Service: Cardiovascular;  Laterality: N/A;   TONSILLECTOMY AND ADENOIDECTOMY  1950   TUBAL LIGATION  1978   Current Facility-Administered Medications  Medication Dose Route Frequency Provider Last Rate Last Admin   0.9 %  sodium chloride  infusion   Intravenous Continuous Adhikari, Amrit, MD 50 mL/hr at 10/09/23 0600 Infusion Verify at 10/09/23 0600    acetaminophen  (TYLENOL ) tablet 650 mg  650 mg Oral Q6H PRN Amponsah, Prosper M, MD       Or   acetaminophen  (TYLENOL ) suppository 650 mg  650 mg Rectal Q6H PRN Lou Claretta HERO, MD       acetaminophen  (TYLENOL ) tablet 500 mg  500 mg Oral BID Amponsah, Prosper M, MD   500 mg at 10/10/23 0902   alum & mag hydroxide-simeth (MAALOX/MYLANTA) 200-200-20 MG/5ML suspension 30 mL  30 mL Oral Q6H PRN Jillian Buttery, MD   30 mL at 10/09/23 0239   apixaban  (ELIQUIS ) tablet 5 mg  5 mg Oral BID Amponsah, Prosper M, MD   5 mg at 10/10/23 9097   calcium -vitamin D  (OSCAL WITH D) 500-5 MG-MCG per tablet 1 tablet  1 tablet Oral Q breakfast Lou Claretta HERO, MD   1 tablet at 10/10/23 0902   cholecalciferol  (VITAMIN D3) 25 MCG (1000 UNIT) tablet 5,000 Units  5,000 Units Oral Daily Amponsah, Prosper M, MD   5,000 Units at 10/10/23 0901   colestipol (COLESTID) tablet 2 g  2 g Oral Daily Kriss Stagger H, DO   2 g at 10/09/23 1258   cyanocobalamin  (VITAMIN B12) tablet 5,000 mcg  5,000 mcg Oral Daily Lou Claretta HERO, MD   5,000 mcg at 10/09/23 0937   escitalopram  (LEXAPRO ) tablet 20 mg  20 mg Oral Daily Amponsah, Prosper M, MD   20 mg at 10/10/23 9097   fluticasone  furoate-vilanterol (BREO ELLIPTA ) 200-25 MCG/ACT 1 puff  1 puff Inhalation Daily Lou Claretta HERO, MD   1 puff at 10/10/23 9192   ipratropium-albuterol  (DUONEB) 0.5-2.5 (3) MG/3ML nebulizer solution 3 mL  3 mL Nebulization Q4H PRN Lou Claretta HERO, MD       loperamide  (IMODIUM ) capsule 4 mg  4 mg Oral BID PRN Lou Claretta HERO, MD       loratadine  (CLARITIN ) tablet 10 mg  10 mg Oral Daily Amponsah, Prosper M, MD   10 mg at 10/10/23 0901   multivitamin with minerals tablet 1 tablet  1 tablet Oral Daily Lou Claretta HERO, MD   1 tablet at 10/10/23 0902   ondansetron  (ZOFRAN ) tablet 4 mg  4 mg Oral Q6H PRN Amponsah, Prosper M, MD       Or   ondansetron  (ZOFRAN ) injection 4 mg  4 mg Intravenous Q6H PRN Amponsah, Prosper M, MD        pantoprazole  (PROTONIX ) EC tablet 40 mg  40 mg Oral BID Amponsah, Prosper M, MD   40 mg at 10/10/23 9097   rosuvastatin  (CRESTOR ) tablet 5 mg  5 mg Oral QHS Amponsah, Prosper M, MD   5 mg at 10/09/23 2221   Allergies as of 10/07/2023 - Review Complete 10/07/2023  Allergen Reaction Noted   Ivp dye [iodinated contrast media] Hives and Other (See Comments) 03/07/2012   Penicillins Anaphylaxis 09/12/2011   Sulfonamide derivatives Other (See Comments) 09/12/2011   Tessalon [benzonatate] Hives and  Rash 04/03/2015   Sulfa antibiotics Other (See Comments) 10/16/2014   Rosuvastatin  calcium  Itching 03/17/2011   Onion Other (See Comments) 04/23/2012   Review of Systems:  Review of Systems  Gastrointestinal:  Negative for abdominal pain, blood in stool, nausea and vomiting.    OBJECTIVE:   Temp:  [97.7 F (36.5 C)-98 F (36.7 C)] 97.7 F (36.5 C) (07/01 0549) Pulse Rate:  [64-81] 64 (07/01 0549) Resp:  [16-18] 16 (07/01 0549) BP: (106-123)/(54-75) 107/54 (07/01 0549) SpO2:  [97 %-98 %] 97 % (07/01 0807) Last BM Date : 10/09/23 Physical Exam Constitutional:      General: She is not in acute distress.    Appearance: She is not ill-appearing, toxic-appearing or diaphoretic.   Cardiovascular:     Rate and Rhythm: Normal rate and regular rhythm.  Pulmonary:     Effort: No respiratory distress.     Breath sounds: Normal breath sounds.  Abdominal:     General: There is no distension.     Palpations: Abdomen is soft.     Tenderness: There is no abdominal tenderness. There is no guarding.   Musculoskeletal:     Right lower leg: No edema.     Left lower leg: No edema.   Skin:    General: Skin is warm and dry.   Neurological:     Mental Status: She is alert.     Labs: Recent Labs    10/08/23 0455  WBC 8.5  HGB 12.9  HCT 38.9  PLT 213   BMET Recent Labs    10/07/23 1236 10/08/23 0455 10/09/23 0918  NA 137 137 140  K 3.5 3.1* 3.7  CL 103 105 110  CO2 23 24 24   GLUCOSE  109* 95 110*  BUN 22 17 11   CREATININE 0.78 0.75 0.35*  CALCIUM  9.6 8.8* 9.1   LFT Recent Labs    10/08/23 0455  PROT 6.0*  ALBUMIN  3.4*  AST 14*  ALT 9  ALKPHOS 62  BILITOT 0.8   PT/INR No results for input(s): LABPROT, INR in the last 72 hours. Diagnostic imaging: ECHOCARDIOGRAM COMPLETE Result Date: 10/09/2023    ECHOCARDIOGRAM REPORT   Patient Name:   Paula Booker Date of Exam: 10/09/2023 Medical Rec #:  [id number]       Height:       64.0 in Accession #:    [id number]      Weight:       171.0 lb Date of Birth:  [birth year]      BSA:          1.830 m Patient Age:    78 years        BP:           117/67 mmHg Patient Gender: F               HR:           73 bpm. Exam Location:  Inpatient Procedure: 2D Echo, Cardiac Doppler and Color Doppler (Both Spectral and Color            Flow Doppler were utilized during procedure). Indications:    R94.31 Abnormal EKG  History:        Patient has prior history of Echocardiogram examinations, most                 recent 05/20/2022.  Sonographer:    Eva Lash Referring Phys: [id number] AMRIT ADHIKARI IMPRESSIONS  1. Left ventricular ejection fraction, by estimation, is 40 to  45%. The left ventricle has mildly decreased function. The left ventricle has no regional wall motion abnormalities. Left ventricular diastolic parameters are consistent with Grade I diastolic dysfunction (impaired relaxation).  2. Right ventricular systolic function is normal. The right ventricular size is normal.  3. Left atrial size was mildly dilated.  4. The mitral valve is normal in structure. Mild mitral valve regurgitation. No evidence of mitral stenosis.  5. The aortic valve is normal in structure. Aortic valve regurgitation is not visualized. No aortic stenosis is present.  6. The inferior vena cava is normal in size with greater than 50% respiratory variability, suggesting right atrial pressure of 3 mmHg. FINDINGS  Left Ventricle: Left ventricular ejection fraction, by  estimation, is 40 to 45%. The left ventricle has mildly decreased function. The left ventricle has no regional wall motion abnormalities. Definity contrast agent was given IV to delineate the left ventricular endocardial borders. The left ventricular internal cavity size was normal in size. There is no left ventricular hypertrophy. Left ventricular diastolic parameters are consistent with Grade I diastolic dysfunction (impaired relaxation). Right Ventricle: The right ventricular size is normal. No increase in right ventricular wall thickness. Right ventricular systolic function is normal. Left Atrium: Left atrial size was mildly dilated. Right Atrium: Right atrial size was normal in size. Pericardium: There is no evidence of pericardial effusion. Mitral Valve: The mitral valve is normal in structure. Mild mitral valve regurgitation. No evidence of mitral valve stenosis. Tricuspid Valve: The tricuspid valve is normal in structure. Tricuspid valve regurgitation is not demonstrated. No evidence of tricuspid stenosis. Aortic Valve: The aortic valve is normal in structure. Aortic valve regurgitation is not visualized. No aortic stenosis is present. Aortic valve mean gradient measures 4.0 mmHg. Aortic valve peak gradient measures 7.2 mmHg. Aortic valve area, by VTI measures 2.50 cm. Pulmonic Valve: The pulmonic valve was normal in structure. Pulmonic valve regurgitation is not visualized. No evidence of pulmonic stenosis. Aorta: The aortic root is normal in size and structure. Venous: The inferior vena cava is normal in size with greater than 50% respiratory variability, suggesting right atrial pressure of 3 mmHg. IAS/Shunts: No atrial level shunt detected by color flow Doppler.  LEFT VENTRICLE PLAX 2D LVIDd:         4.70 cm      Diastology LVIDs:         4.00 cm      LV e' medial:    7.29 cm/s LV PW:         1.30 cm      LV E/e' medial:  10.5 LV IVS:        1.10 cm      LV e' lateral:   10.10 cm/s LVOT diam:     2.00 cm       LV E/e' lateral: 7.6 LV SV:         77 LV SV Index:   42 LVOT Area:     3.14 cm  LV Volumes (MOD) LV vol d, MOD A2C: 130.0 ml LV vol d, MOD A4C: 132.0 ml LV vol s, MOD A2C: 61.7 ml LV vol s, MOD A4C: 61.5 ml LV SV MOD A2C:     68.3 ml LV SV MOD A4C:     132.0 ml LV SV MOD BP:      66.6 ml RIGHT VENTRICLE RV S prime:     15.90 cm/s TAPSE (M-mode): 2.1 cm LEFT ATRIUM  Index LA diam:        4.50 cm 2.46 cm/m LA Vol (A2C):   64.0 ml 34.97 ml/m LA Vol (A4C):   69.1 ml 37.75 ml/m LA Biplane Vol: 67.1 ml 36.66 ml/m  AORTIC VALVE AV Area (Vmax):    2.70 cm AV Area (Vmean):   2.32 cm AV Area (VTI):     2.50 cm AV Vmax:           134.00 cm/s AV Vmean:          93.300 cm/s AV VTI:            0.309 m AV Peak Grad:      7.2 mmHg AV Mean Grad:      4.0 mmHg LVOT Vmax:         115.00 cm/s LVOT Vmean:        69.000 cm/s LVOT VTI:          0.246 m LVOT/AV VTI ratio: 0.80 MITRAL VALVE               TRICUSPID VALVE MV Area (PHT): 2.43 cm    TR Peak grad:   24.0 mmHg MV Decel Time: 312 msec    TR Vmax:        245.00 cm/s MV E velocity: 76.30 cm/s MV A velocity: 99.80 cm/s  SHUNTS MV E/A ratio:  0.76        Systemic VTI:  0.25 m                            Systemic Diam: 2.00 cm Aditya Sabharwal Electronically signed by Ria Commander Signature Date/Time: 10/09/2023/3:14:37 PM    Final    IMPRESSION: Acute diarrhea, GI pathogen panel negative, c.diff negative Status post cholecystectomy Heart failure reduced ejection fraction Atrial fibrillation on Eliquis   PLAN: -Some symptom improvement with use of colestipol 2 g p.o. daily -Would continue this on discharge, instructed patient to avoid taking other medications 1 hour before and 2-3 hours after colestipol -If symptoms stagnate, would consider colonoscopy to biopsy for microscopic colitis -Patient has been dismissed from [facility] in the outpatient setting, she will plan to follow-up with different GI group on discharge -[facility] will sign off and be  available should any questions arise   LOS: 0 days   Estefana Keas, DO [facility]

## 2023-10-19 ENCOUNTER — Other Ambulatory Visit: Payer: Self-pay

## 2023-10-19 NOTE — Patient Outreach (Signed)
 Aging Gracefully Program  10/19/2023  Paula Booker [birth year] [id number]   [facility] Evaluation Interviewer made contact with patient. Aging Gracefully 9 month survey completed.    [facility]  Population Health Care Management Assistant  Direct Dial: [phone]  Fax: [phone] Website: [email or web address]

## 2023-10-23 DIAGNOSIS — Z6832 Body mass index (BMI) 32.0-32.9, adult: Secondary | ICD-10-CM | POA: Diagnosis not present

## 2023-10-23 DIAGNOSIS — R197 Diarrhea, unspecified: Secondary | ICD-10-CM | POA: Diagnosis not present

## 2023-10-26 ENCOUNTER — Other Ambulatory Visit: Payer: Self-pay | Admitting: Internal Medicine

## 2023-11-01 DIAGNOSIS — R197 Diarrhea, unspecified: Secondary | ICD-10-CM | POA: Diagnosis not present

## 2023-11-01 DIAGNOSIS — Z9049 Acquired absence of other specified parts of digestive tract: Secondary | ICD-10-CM | POA: Diagnosis not present

## 2023-11-06 ENCOUNTER — Ambulatory Visit: Attending: Internal Medicine

## 2023-11-06 DIAGNOSIS — Z95810 Presence of automatic (implantable) cardiac defibrillator: Secondary | ICD-10-CM | POA: Diagnosis not present

## 2023-11-06 DIAGNOSIS — I5022 Chronic systolic (congestive) heart failure: Secondary | ICD-10-CM

## 2023-11-07 DIAGNOSIS — M10072 Idiopathic gout, left ankle and foot: Secondary | ICD-10-CM | POA: Diagnosis not present

## 2023-11-08 NOTE — Progress Notes (Signed)
 EPIC Encounter for ICM Monitoring  Patient Name: Paula Booker is a 78 y.o. female Date: 11/08/2023 Primary Care Physican: Paula Charleston, MD Primary Cardiologist: Paula Booker Electrophysiologist: Paula Booker BiV Pacing: 94.9% 08/01/2022 Office Weight: 191 lbs 09/07/2022 Weight: 191 lbs 01/27/2023 Weight: unknown 05/04/2023 Weight: 189 lbs 06/07/2023 Weight: 186 lbs 07/11/2023 Weight: 180 lbs 08/16/2023 Weight: 172 lbs (loss of 40 pounds over the past 2 years) 09/20/2023 Weight: 177 - 180 lbs 11/08/2023 Weight: 174 lbs (stable)   Time in AT/AF  <0.1 hours/day (<0.1 %)          Spoke with patient and heart failure questions reviewed.  Transmission results reviewed.  Pt asymptomatic for fluid accumulation.  Reports feeling well at this time and voices no complaints.  She reports hospitalization for dizziness, low BP and some dehydration in June.  Carvedilol , Entresto  and Spironolactone  were held for 7 days at hospital discharge.  She only restarted Entresto .  Advised to review Hospital discharge instructions since it says she should have resumed all 3 on 7/7.  Advised if any questions about resuming the meds to contact Dr Paula Booker.    Optivol thoracic impedance suggesting possible fluid accumulation starting 6/27 (given IV fluids during June hospitalization).      Prescribed:  Furosemide  40 mg take 1-2 tablets (40 mg - 80 mg total) by mouth as directed once daily for edema, SOB or weight gain.   Pt reports on 07/11/23 she normally takes 1 tablet daily and only takes 2nd one if needed for fluid symptoms.   Labs: 10/09/2023 Creatinine 0.35, BUN 11, Potassium 3.7, Sodium 140, GFR >60  10/08/2023 Creatinine 0.75, BUN 17, Potassium 3.1, Sodium 137, GFR >60  10/07/2023 Creatinine 0.78, BUN 22, Potassium 3.5, Sodium 137, GFR >60  A complete set of results can be found in Results Review.   Recommendations:  Advised to take Furosemide  1 tablet 40 mg twice a day x 2 days. Advised if 2 fluid pills causes her dizziness  then don't take the 2nd day and call back.      Follow-up plan: ICM clinic phone appointment on 11/15/2023 to recheck fluid levels.   91 day device clinic remote transmission 11/15/2023.     EP/Cardiology Office Visits:  Recall 08/16/2024 with Dr Paula Booker.    11/27/2023 with Dr Paula Booker (1st visit)   Copy of ICM check sent to Dr. Waddell.   3 month ICM trend: 11/06/2023.    12-14 Month ICM trend:     Paula GORMAN Garner, RN 11/08/2023 3:15 PM

## 2023-11-13 ENCOUNTER — Telehealth: Payer: Self-pay

## 2023-11-13 NOTE — Telephone Encounter (Signed)
 Returned call to patient as requested by voice mail message.  She asked if she can drink more fluids today due to she is cleaning out the basement and sweating a lot.  3 lb Weight loss since taking extra 40 mg Lasix  for 3 days last week and weight this AM 172 lbs.  Sent manual transmission today for updated fluid level check and still suggesting possible fluid accumulation.    Advised to drink extra fluids as needed today.  Also take extra Lasix  40 mg tomorrow and will recheck fluid levels on 8/6.

## 2023-11-15 ENCOUNTER — Ambulatory Visit: Attending: Internal Medicine

## 2023-11-15 ENCOUNTER — Ambulatory Visit (INDEPENDENT_AMBULATORY_CARE_PROVIDER_SITE_OTHER): Payer: Medicare HMO

## 2023-11-15 DIAGNOSIS — Z95810 Presence of automatic (implantable) cardiac defibrillator: Secondary | ICD-10-CM

## 2023-11-15 DIAGNOSIS — I428 Other cardiomyopathies: Secondary | ICD-10-CM

## 2023-11-15 DIAGNOSIS — I5022 Chronic systolic (congestive) heart failure: Secondary | ICD-10-CM

## 2023-11-15 NOTE — Progress Notes (Signed)
 EPIC Encounter for ICM Monitoring  Patient Name: Paula Booker is a 78 y.o. female Date: 11/15/2023 Primary Care Physican: Frederik Charleston, MD Primary Cardiologist: Waddell Electrophysiologist: Waddell BiV Pacing: 95.2% 08/01/2022 Office Weight: 191 lbs 09/07/2022 Weight: 191 lbs 01/27/2023 Weight: unknown 05/04/2023 Weight: 189 lbs 06/07/2023 Weight: 186 lbs 07/11/2023 Weight: 180 lbs 08/16/2023 Weight: 172 lbs (loss of 40 pounds over the past 2 years) 09/20/2023 Weight: 177 - 180 lbs 11/08/2023 Weight: 174 lbs (stable) 11/15/2023 Weight: 172 lbs   Time in AT/AF  <0.1 hours/day (<0.1 %)          Spoke with patient and heart failure questions reviewed.  Transmission results reviewed.  Pt asymptomatic for fluid accumulation.  Reports feeling well at this time and voices no complaints.      Optivol thoracic impedance suggesting fluid levels returned to normal after taking Lasix  40 mg bid x 2 days.      Prescribed:  Furosemide  40 mg take 1-2 tablets (40 mg - 80 mg total) by mouth as directed once daily for edema, SOB or weight gain.   Pt reports on 07/11/23 she normally takes 1 tablet daily and only takes 2nd one if needed for fluid symptoms.   Labs: 10/09/2023 Creatinine 0.35, BUN 11, Potassium 3.7, Sodium 140, GFR >60  10/08/2023 Creatinine 0.75, BUN 17, Potassium 3.1, Sodium 137, GFR >60  10/07/2023 Creatinine 0.78, BUN 22, Potassium 3.5, Sodium 137, GFR >60  A complete set of results can be found in Results Review.   Recommendations:  No changes and encouraged to call if experiencing any fluid symptoms.     Follow-up plan: ICM clinic phone appointment on 12/12/2023.   91 day device clinic remote transmission 02/14/2024.     EP/Cardiology Office Visits:  Recall 08/16/2024 with Dr Waddell.    11/27/2023 with Dr Michele (1st visit)   Copy of ICM check sent to Dr. Waddell.   3 month ICM trend: 11/15/2023.    12-14 Month ICM trend:     Mitzie GORMAN Garner, RN 11/15/2023 7:33 AM

## 2023-11-16 ENCOUNTER — Ambulatory Visit: Payer: Self-pay | Admitting: Internal Medicine

## 2023-11-16 LAB — CUP PACEART REMOTE DEVICE CHECK
Battery Remaining Longevity: 118 mo
Battery Voltage: 3 V
Brady Statistic RV Percent Paced: 5.54 %
Date Time Interrogation Session: 20250804120903
HighPow Impedance: 78 Ohm
Implantable Lead Connection Status: 753985
Implantable Lead Connection Status: 753985
Implantable Lead Connection Status: 753985
Implantable Lead Implant Date: 20131212
Implantable Lead Implant Date: 20131212
Implantable Lead Implant Date: 20240205
Implantable Lead Location: 753858
Implantable Lead Location: 753859
Implantable Lead Location: 753860
Implantable Lead Model: 4598
Implantable Lead Model: 5076
Implantable Lead Model: 6935
Implantable Pulse Generator Implant Date: 20240205
Lead Channel Impedance Value: 1045 Ohm
Lead Channel Impedance Value: 1064 Ohm
Lead Channel Impedance Value: 1083 Ohm
Lead Channel Impedance Value: 1197 Ohm
Lead Channel Impedance Value: 380 Ohm
Lead Channel Impedance Value: 437 Ohm
Lead Channel Impedance Value: 494 Ohm
Lead Channel Impedance Value: 646 Ohm
Lead Channel Impedance Value: 722 Ohm
Lead Channel Impedance Value: 817 Ohm
Lead Channel Impedance Value: 817 Ohm
Lead Channel Impedance Value: 836 Ohm
Lead Channel Impedance Value: 969 Ohm
Lead Channel Pacing Threshold Amplitude: 0.375 V
Lead Channel Pacing Threshold Amplitude: 0.75 V
Lead Channel Pacing Threshold Amplitude: 1.375 V
Lead Channel Pacing Threshold Pulse Width: 0.4 ms
Lead Channel Pacing Threshold Pulse Width: 0.4 ms
Lead Channel Pacing Threshold Pulse Width: 0.4 ms
Lead Channel Sensing Intrinsic Amplitude: 18.8 mV
Lead Channel Sensing Intrinsic Amplitude: 4 mV
Lead Channel Setting Pacing Amplitude: 2 V
Lead Channel Setting Pacing Amplitude: 2 V
Lead Channel Setting Pacing Amplitude: 2.5 V
Lead Channel Setting Pacing Pulse Width: 0.4 ms
Lead Channel Setting Pacing Pulse Width: 0.4 ms
Lead Channel Setting Sensing Sensitivity: 0.3 mV
Zone Setting Status: 755011
Zone Setting Status: 755011

## 2023-11-27 ENCOUNTER — Ambulatory Visit: Admitting: Cardiology

## 2023-12-01 ENCOUNTER — Other Ambulatory Visit: Payer: Self-pay

## 2023-12-01 ENCOUNTER — Encounter (HOSPITAL_COMMUNITY): Payer: Self-pay | Admitting: *Deleted

## 2023-12-01 ENCOUNTER — Observation Stay (HOSPITAL_COMMUNITY)
Admission: EM | Admit: 2023-12-01 | Discharge: 2023-12-02 | Disposition: A | Discharge status: Home / Self Care | Attending: Internal Medicine | Admitting: Internal Medicine

## 2023-12-01 ENCOUNTER — Emergency Department (HOSPITAL_COMMUNITY)

## 2023-12-01 DIAGNOSIS — Z95810 Presence of automatic (implantable) cardiac defibrillator: Secondary | ICD-10-CM | POA: Diagnosis not present

## 2023-12-01 DIAGNOSIS — I480 Paroxysmal atrial fibrillation: Secondary | ICD-10-CM | POA: Insufficient documentation

## 2023-12-01 DIAGNOSIS — Z6830 Body mass index (BMI) 30.0-30.9, adult: Secondary | ICD-10-CM | POA: Diagnosis not present

## 2023-12-01 DIAGNOSIS — G43809 Other migraine, not intractable, without status migrainosus: Secondary | ICD-10-CM | POA: Insufficient documentation

## 2023-12-01 DIAGNOSIS — G43909 Migraine, unspecified, not intractable, without status migrainosus: Secondary | ICD-10-CM

## 2023-12-01 DIAGNOSIS — J45909 Unspecified asthma, uncomplicated: Secondary | ICD-10-CM | POA: Diagnosis not present

## 2023-12-01 DIAGNOSIS — I110 Hypertensive heart disease with heart failure: Secondary | ICD-10-CM | POA: Insufficient documentation

## 2023-12-01 DIAGNOSIS — F419 Anxiety disorder, unspecified: Secondary | ICD-10-CM | POA: Diagnosis not present

## 2023-12-01 DIAGNOSIS — E785 Hyperlipidemia, unspecified: Secondary | ICD-10-CM | POA: Insufficient documentation

## 2023-12-01 DIAGNOSIS — R29818 Other symptoms and signs involving the nervous system: Secondary | ICD-10-CM | POA: Diagnosis not present

## 2023-12-01 DIAGNOSIS — Z7901 Long term (current) use of anticoagulants: Secondary | ICD-10-CM | POA: Insufficient documentation

## 2023-12-01 DIAGNOSIS — Z79899 Other long term (current) drug therapy: Secondary | ICD-10-CM | POA: Insufficient documentation

## 2023-12-01 DIAGNOSIS — H5340 Unspecified visual field defects: Secondary | ICD-10-CM | POA: Diagnosis present

## 2023-12-01 DIAGNOSIS — E66811 Obesity, class 1: Secondary | ICD-10-CM | POA: Insufficient documentation

## 2023-12-01 DIAGNOSIS — F32A Depression, unspecified: Secondary | ICD-10-CM | POA: Insufficient documentation

## 2023-12-01 DIAGNOSIS — I669 Occlusion and stenosis of unspecified cerebral artery: Secondary | ICD-10-CM | POA: Diagnosis not present

## 2023-12-01 DIAGNOSIS — I5022 Chronic systolic (congestive) heart failure: Secondary | ICD-10-CM | POA: Insufficient documentation

## 2023-12-01 DIAGNOSIS — G459 Transient cerebral ischemic attack, unspecified: Secondary | ICD-10-CM | POA: Diagnosis not present

## 2023-12-01 DIAGNOSIS — G43109 Migraine with aura, not intractable, without status migrainosus: Secondary | ICD-10-CM | POA: Diagnosis present

## 2023-12-01 LAB — COMPREHENSIVE METABOLIC PANEL WITH GFR
ALT: 14 U/L (ref 0–44)
AST: 20 U/L (ref 15–41)
Albumin: 3.8 g/dL (ref 3.5–5.0)
Alkaline Phosphatase: 67 U/L (ref 38–126)
Anion gap: 12 (ref 5–15)
BUN: 24 mg/dL — ABNORMAL HIGH (ref 8–23)
CO2: 22 mmol/L (ref 22–32)
Calcium: 9.8 mg/dL (ref 8.9–10.3)
Chloride: 105 mmol/L (ref 98–111)
Creatinine, Ser: 0.9 mg/dL (ref 0.44–1.00)
GFR, Estimated: >60 mL/min (ref >60)
Glucose, Bld: 120 mg/dL — ABNORMAL HIGH (ref 70–99)
Potassium: 3.8 mmol/L (ref 3.5–5.1)
Sodium: 139 mmol/L (ref 135–145)
Total Bilirubin: 0.5 mg/dL (ref 0.0–1.2)
Total Protein: 6.9 g/dL (ref 6.5–8.1)

## 2023-12-01 LAB — CBC
HCT: 43 % (ref 36.0–46.0)
Hemoglobin: 14.3 g/dL (ref 12.0–15.0)
MCH: 31 pg (ref 26.0–34.0)
MCHC: 33.3 g/dL (ref 30.0–36.0)
MCV: 93.3 fL (ref 80.0–100.0)
Platelets: 231 K/uL (ref 150–400)
RBC: 4.61 MIL/uL (ref 3.87–5.11)
RDW: 12.1 % (ref 11.5–15.5)
WBC: 7.1 K/uL (ref 4.0–10.5)
nRBC: 0 % (ref 0.0–0.2)

## 2023-12-01 LAB — DIFFERENTIAL
Abs Immature Granulocytes: 0.02 K/uL (ref 0.00–0.07)
Basophils Absolute: 0 K/uL (ref 0.0–0.1)
Basophils Relative: 1 %
Eosinophils Absolute: 0.2 K/uL (ref 0.0–0.5)
Eosinophils Relative: 3 %
Immature Granulocytes: 0 %
Lymphocytes Relative: 37 %
Lymphs Abs: 2.6 K/uL (ref 0.7–4.0)
Monocytes Absolute: 0.5 K/uL (ref 0.1–1.0)
Monocytes Relative: 7 %
Neutro Abs: 3.7 K/uL (ref 1.7–7.7)
Neutrophils Relative %: 52 %

## 2023-12-01 LAB — I-STAT CHEM 8, ED
BUN: 27 mg/dL — ABNORMAL HIGH (ref 8–23)
Calcium, Ion: 1.12 mmol/L — ABNORMAL LOW (ref 1.15–1.40)
Chloride: 104 mmol/L (ref 98–111)
Creatinine, Ser: 0.9 mg/dL (ref 0.44–1.00)
Glucose, Bld: 122 mg/dL — ABNORMAL HIGH (ref 70–99)
HCT: 42 % (ref 36.0–46.0)
Hemoglobin: 14.3 g/dL (ref 12.0–15.0)
Potassium: 3.9 mmol/L (ref 3.5–5.1)
Sodium: 140 mmol/L (ref 135–145)
TCO2: 24 mmol/L (ref 22–32)

## 2023-12-01 LAB — CBG MONITORING, ED: Glucose-Capillary: 123 mg/dL — ABNORMAL HIGH (ref 70–99)

## 2023-12-01 LAB — HEMOGLOBIN A1C
Hgb A1c MFr Bld: 5.3 % (ref 4.8–5.6)
Mean Plasma Glucose: 105.41 mg/dL

## 2023-12-01 LAB — APTT: aPTT: 34 s (ref 24–36)

## 2023-12-01 LAB — ETHANOL: Alcohol, Ethyl (B): <15 mg/dL (ref <15)

## 2023-12-01 LAB — PROTIME-INR
INR: 1.4 — ABNORMAL HIGH (ref 0.8–1.2)
Prothrombin Time: 17.9 s — ABNORMAL HIGH (ref 11.4–15.2)

## 2023-12-01 MED ORDER — CARVEDILOL 6.25 MG PO TABS: 6.2500 mg | ORAL_TABLET | Freq: Two times a day (BID) | ORAL | Status: DC

## 2023-12-01 MED ORDER — ROSUVASTATIN CALCIUM 5 MG PO TABS: 5.0000 mg | ORAL_TABLET | Freq: Every day | ORAL | Status: DC

## 2023-12-01 MED ORDER — SENNOSIDES-DOCUSATE SODIUM 8.6-50 MG PO TABS: 1.0000 | ORAL_TABLET | Freq: Every evening | ORAL | Status: DC | PRN

## 2023-12-01 MED ORDER — METHYLPREDNISOLONE SODIUM SUCC 40 MG IJ SOLR
40.0000 mg | Freq: Once | INTRAMUSCULAR | Status: AC
  Administered 2023-12-01: 40 mg via INTRAVENOUS
  Filled 2023-12-01: qty 1

## 2023-12-01 MED ORDER — FLUTICASONE FUROATE-VILANTEROL 200-25 MCG/ACT IN AEPB
1.0000 | INHALATION_SPRAY | Freq: Every day | RESPIRATORY_TRACT | Status: DC
  Administered 2023-12-02: 1 via RESPIRATORY_TRACT
  Filled 2023-12-01: qty 28

## 2023-12-01 MED ORDER — PANTOPRAZOLE SODIUM 40 MG PO TBEC
40.0000 mg | DELAYED_RELEASE_TABLET | Freq: Two times a day (BID) | ORAL | Status: DC
  Administered 2023-12-02: 40 mg via ORAL
  Filled 2023-12-01 (×2): qty 1

## 2023-12-01 MED ORDER — ASPIRIN 81 MG PO TBEC
81.0000 mg | DELAYED_RELEASE_TABLET | Freq: Every day | ORAL | Status: DC
  Administered 2023-12-02: 81 mg via ORAL
  Filled 2023-12-01: qty 1

## 2023-12-01 MED ORDER — DIPHENHYDRAMINE HCL 50 MG/ML IJ SOLN: 50.0000 mg | Freq: Once | INTRAMUSCULAR | Status: DC

## 2023-12-01 MED ORDER — ESCITALOPRAM OXALATE 20 MG PO TABS
20.0000 mg | ORAL_TABLET | Freq: Every morning | ORAL | Status: DC
  Administered 2023-12-02: 20 mg via ORAL
  Filled 2023-12-01: qty 1

## 2023-12-01 MED ORDER — STROKE: EARLY STAGES OF RECOVERY BOOK
Freq: Once | Status: AC
  Filled 2023-12-01: qty 1

## 2023-12-01 MED ORDER — SODIUM CHLORIDE 0.9 % IV SOLN: INTRAVENOUS | Status: DC

## 2023-12-01 MED ORDER — COLESTIPOL HCL 1 G PO TABS
2.0000 g | ORAL_TABLET | Freq: Every day | ORAL | Status: DC
  Administered 2023-12-02: 2 g via ORAL
  Filled 2023-12-01: qty 2

## 2023-12-01 MED ORDER — ALBUTEROL SULFATE (2.5 MG/3ML) 0.083% IN NEBU: 3.0000 mL | INHALATION_SOLUTION | Freq: Four times a day (QID) | RESPIRATORY_TRACT | Status: DC | PRN

## 2023-12-01 MED ORDER — ACETAMINOPHEN 325 MG PO TABS: 650.0000 mg | ORAL_TABLET | ORAL | Status: DC | PRN

## 2023-12-01 MED ORDER — DIPHENHYDRAMINE HCL 25 MG PO CAPS: 50.0000 mg | ORAL_CAPSULE | Freq: Once | ORAL | Status: DC

## 2023-12-01 MED ORDER — DIPHENHYDRAMINE HCL 50 MG/ML IJ SOLN: 50.0000 mg | Freq: Once | INTRAMUSCULAR | Status: AC

## 2023-12-01 MED ORDER — DIPHENHYDRAMINE HCL 25 MG PO CAPS
50.0000 mg | ORAL_CAPSULE | Freq: Once | ORAL | Status: AC
  Administered 2023-12-02: 50 mg via ORAL
  Filled 2023-12-01: qty 2

## 2023-12-01 MED ORDER — APIXABAN 5 MG PO TABS
5.0000 mg | ORAL_TABLET | Freq: Two times a day (BID) | ORAL | Status: DC
  Administered 2023-12-02: 5 mg via ORAL
  Filled 2023-12-01: qty 1

## 2023-12-01 MED ORDER — SACUBITRIL-VALSARTAN 24-26 MG PO TABS
1.0000 | ORAL_TABLET | Freq: Two times a day (BID) | ORAL | Status: DC
  Administered 2023-12-02: 1 via ORAL
  Filled 2023-12-01 (×3): qty 1

## 2023-12-01 MED ORDER — ACETAMINOPHEN 160 MG/5ML PO SOLN: 650.0000 mg | ORAL | Status: DC | PRN

## 2023-12-01 MED ORDER — ROSUVASTATIN CALCIUM 20 MG PO TABS: 40.0000 mg | ORAL_TABLET | Freq: Every day | ORAL | Status: DC

## 2023-12-01 MED ORDER — FUROSEMIDE 20 MG PO TABS
20.0000 mg | ORAL_TABLET | Freq: Every day | ORAL | Status: DC
  Administered 2023-12-02: 20 mg via ORAL
  Filled 2023-12-01: qty 1

## 2023-12-01 MED ORDER — SPIRONOLACTONE 25 MG PO TABS: 25.0000 mg | ORAL_TABLET | Freq: Every day | ORAL | Status: DC

## 2023-12-01 MED ORDER — PREDNISONE 5 MG PO TABS: 50.0000 mg | ORAL_TABLET | Freq: Four times a day (QID) | ORAL | Status: DC

## 2023-12-01 MED ORDER — ACETAMINOPHEN 650 MG RE SUPP: 650.0000 mg | RECTAL | Status: DC | PRN

## 2023-12-01 MED ORDER — ACETAMINOPHEN 500 MG PO TABS: 500.0000 mg | ORAL_TABLET | Freq: Four times a day (QID) | ORAL | Status: DC | PRN

## 2023-12-01 MED ORDER — VITAMIN B-12 1000 MCG PO TABS
5000.0000 ug | ORAL_TABLET | Freq: Every morning | ORAL | Status: DC
  Administered 2023-12-02: 5000 ug via ORAL
  Filled 2023-12-01: qty 5

## 2023-12-01 NOTE — ED Provider Triage Note (Signed)
 Emergency Medicine Provider Triage Evaluation Note  Paula Booker , a 78 y.o. female  was evaluated in triage.  Pt complains of reports some visual difficulty around 130 to 2 PM, went to sleep, woke up around 5 PM.  Called her doctor, advised to come to the ED due to concern for possible stroke.  She endorses some possible facial weakness, denies any arm, leg weakness, numbness.  She denies any double vision..  Review of Systems  Positive: Visual field change, possible facial droop Negative: Weakness, numbness  Physical Exam  BP 111/68 (BP Location: Right Arm)   Pulse 79   Temp 97.6 F (36.4 C)   Resp (!) 21   Ht 5' 3.25 (1.607 m)   Wt 77.6 kg   SpO2 99%   BMI 30.05 kg/m  Gen:   Awake, no distress   Resp:  Normal effort  MSK:   Moves extremities without difficulty  Other:  No objective facial droop, or arm, leg weakness noted on exam.  CN II through XII grossly intact.  Nonspecific visual field change.  No diplopia.  Medical Decision Making  Medically screening exam initiated at 5:57 PM.  Appropriate orders placed.  Paula Booker was informed that the remainder of the evaluation will be completed by another provider, this initial triage assessment does not replace that evaluation, and the importance of remaining in the ED until their evaluation is complete.  TIA/stroke workup started, code stroke not activated from triage.   Paula Booker, [place] 12/01/23 1757

## 2023-12-01 NOTE — Consult Note (Signed)
 NEUROLOGY CONSULT NOTE   Date of service: December 01, 2023 Patient Name: Paula Booker MRN:  [id number] DOB:  1945-11-12 Chief Complaint: Transient headache, aphasia Requesting Provider: Marca Maude POUR, MD  History of Present Illness  Paula Booker is a 78 y.o. female with hx of trigeminal neuralgia, migraines, afib (on Eliquis ), WPW, CHF (s/p AICD), HTN, OSA, anxiety, depression who presents to the ED with transient episode of blurry vision, expressive aphasia, and headache.  Patient was her normal self until around 1:45pm today when she was talking to her friend. She felt a streak travel across her forehead. She tried to say something but renato was coming out. She also had difficulty reading what was on the TV. Denied blurry vision at that time, but just simply could not read the text. She then had blurry vision and a headache. She tried to nap it off. When she woke up around 5pm, all symptoms had resolved. She is concerned because she reports not having migraines since October 2023, after microvascular decompression was performed for her trigeminal neuralgia.   At bedside, she confirms that a photo of a scintillating scotoma is what she sees during these episodes.   She has had similar episodes in February and August 2024. She confirms that across all 3 times, the episodes start with the sensation of a streak traveling across her forehead and had blurry vision. In February 2024, she also reported dizziness & on exam during code stroke, was found to have left superior quadrantanopia, distal LUE weakness, left leg drift. No acute treatment was given. At that time, she had been off of Eliquis  for a few days due to AICD implantation. At time of discharge, she was still reporting seeing floaters in both eyes. It was thought that she had a TIA or a small stroke. As it was too soon after AICD implantation, MRI was not able to be obtained.  In August 2024, she presented as a code stroke  with headache, blurry vision, left-sided weakness. No acute treatment was given. Her symptoms resolved after a nap and she discharged with a mild headache. While in the ED, her HR went down to the 30's. It was thought that perhaps she had either a complex migraine or a hypoperfusion event. She was to receive MRI brain outpatient but was not able to.    LKW: 145pm 12/01/23 Modified rankin score: 0-Completely asymptomatic and back to baseline post- stroke IV Thrombolysis: No (outside of window, resolved) EVT: No (resolved) ICH Score:N/A    NIHSS components Score: Comment  1a Level of Conscious 0[x]  1[]  2[]  3[]      1b LOC Questions 0[x]  1[]  2[]       1c LOC Commands 0[x]  1[]  2[]       2 Best Gaze 0[x]  1[]  2[]       3 Visual 0[x]  1[]  2[]  3[]      4 Facial Palsy 0[x]  1[]  2[]  3[]      5a Motor Arm - left 0[x]  1[]  2[]  3[]  4[]  UN[]    5b Motor Arm - Right 0[x]  1[]  2[]  3[]  4[]  UN[]    6a Motor Leg - Left 0[x]  1[]  2[]  3[]  4[]  UN[]    6b Motor Leg - Right 0[x]  1[]  2[]  3[]  4[]  UN[]    7 Limb Ataxia 0[x]  1[]  2[]  UN[]      8 Sensory 0[x]  1[]  2[]  UN[]      9 Best Language 0[x]  1[]  2[]  3[]      10 Dysarthria 0[x]  1[]  2[]  UN[]      11 Extinct. and  Inattention 0[x]  1[]  2[]       TOTAL: 0      ROS  Comprehensive ROS performed and pertinent positives documented in HPI    Past History   Past Medical History:  Diagnosis Date   AICD (automatic cardioverter/defibrillator) present    Anemia    as a child   Anxiety    Arthritis    hands and knees (03/22/2012)   Asthma    Basal cell carcinoma 04/27/2011   mid forehead(MOHS)   Bursitis    CHF (congestive heart failure) (HCC) 09/2011   pt. denies   Depression    Dysrhythmia    WPW   GERD (gastroesophageal reflux disease)    Head injury, acute, with loss of consciousness (HCC)    History of hiatal hernia    Hypertension    ICD (implantable cardiac defibrillator) in place 03/2012   Migraines    migraines    Nausea vomiting and diarrhea 03/18/2015    NICM (nonischemic cardiomyopathy) (HCC)    Obesity (BMI 30-39.9)    negative sleep study 2014   OSA (obstructive sleep apnea) 05/27/2018   Mild with AHI 6/hr    no cpap   Pneumonia    hx   Shortness of breath    @ any time I can get SOB (03/22/2012)   Skin cancer 1999   Squamous cell carcinoma of skin 07/08/2020   in situ right malar cheek-[id number]   Sudden arrhythmia death syndrome    WPW (Wolff-Parkinson-White syndrome)     Past Surgical History:  Procedure Laterality Date   BIOPSY  09/07/2021   Procedure: BIOPSY;  Surgeon: Elicia Claw, MD;  Location: [facility];  Service: Gastroenterology;;   JUVENTINO LLANO N/A 05/16/2022   Procedure: BIV LLANO;  Surgeon: Waddell Danelle ORN, MD;  Location: [facility];  Service: Cardiovascular;  Laterality: N/A;   CARDIAC CATHETERIZATION  09/18/11   no significant CAD   CARDIAC DEFIBRILLATOR PLACEMENT  03/22/2012   DDD   CHOLECYSTECTOMY  ~ 2003   ESOPHAGOGASTRODUODENOSCOPY (EGD) WITH PROPOFOL  N/A 09/07/2021   Procedure: ESOPHAGOGASTRODUODENOSCOPY (EGD) WITH PROPOFOL ;  Surgeon: Elicia Claw, MD;  Location: [facility];  Service: Gastroenterology;  Laterality: N/A;   IMPLANTABLE CARDIOVERTER DEFIBRILLATOR IMPLANT N/A 03/22/2012   Procedure: IMPLANTABLE CARDIOVERTER DEFIBRILLATOR IMPLANT;  Surgeon: Danelle ORN Waddell, MD;  Location: [facility];  Service: Cardiovascular;  Laterality: N/A;   KNEE ARTHROSCOPY Left 11/29/2017   Procedure: ARTHROSCOPY LEFT KNEE;  Surgeon: Yvone Rush, MD;  Location: [facility];  Service: Orthopedics;  Laterality: Left;   KNEE ARTHROSCOPY Right 08/09/2019   Procedure: ARTHROSCOPY RIGHT KNEE, CHONDROPLASTY,  PARTIAL MEDIAL  MENSICECTOMY;  Surgeon: Yvone Rush, MD;  Location: [facility];  Service: Orthopedics;  Laterality: Right;   KYPHOPLASTY N/A 11/27/2013   Procedure: KYPHOPLASTY;  Surgeon: Oneil Rodgers Priestly, MD;  Location: [facility] OR;  Service: Orthopedics;  Laterality: N/A;  T9, T11 kyphoplasty   KYPHOPLASTY N/A  06/05/2014   Procedure: KYPHOPLASTY;  Surgeon: Oneil Rodgers Priestly, MD;  Location: [facility] OR;  Service: Orthopedics;  Laterality: N/A;  T7 kyphoplasty   MOHS SURGERY  06/2011   forehead (03/22/2012)   SKIN CANCER EXCISION  1999   top of my head and my right back shoulder   SKIN CANCER EXCISION     back   SKIN CANCER EXCISION     face   SUPRAVENTRICULAR TACHYCARDIA ABLATION  03/22/2012   unable to induce VT/RN report 03/22/2012   SUPRAVENTRICULAR TACHYCARDIA ABLATION N/A 03/22/2012   Procedure: SUPRAVENTRICULAR TACHYCARDIA  ABLATION;  Surgeon: Danelle LELON Birmingham, MD;  Location: [facility] CATH LAB;  Service: Cardiovascular;  Laterality: N/A;   TONSILLECTOMY AND ADENOIDECTOMY  1950   TUBAL LIGATION  1978    Family History: Family History  Problem Relation Age of Onset   Congestive Heart Failure Mother        died 31   Kidney Stones Mother    Deafness Mother    Atrial fibrillation Mother    Congestive Heart Failure Father    Deafness Father    Healthy Sister    Healthy Sister    Healthy Sister    Congestive Heart Failure Brother    Atrial fibrillation Brother    Tuberculosis Paternal Grandfather    Migraines Neg Hx    Neural tube defect Neg Hx     Social History  reports that she has never smoked. She has never used smokeless tobacco. She reports that she does not currently use alcohol. She reports that she does not use drugs.  Allergies  Allergen Reactions   Ivp Dye [Iodinated Contrast Media] Hives and Other (See Comments)    pt tolerates with benadryl    Penicillins Anaphylaxis    Has patient had a PCN reaction causing immediate rash, facial/tongue/throat swelling, SOB or lightheadedness with hypotension: Yes   Tessalon [Benzonatate] Hives and Rash    Severe rash   Sulfa Antibiotics Other (See Comments)    Migraines   Sulfonamide Derivatives Other (See Comments)    Migraines   Rosuvastatin  Calcium  Itching    Medications   Current Facility-Administered Medications:     [START ON 12/02/2023]  stroke: early stages of recovery book, , Does not apply, Once, Acheampong, Maude POUR, MD   0.9 %  sodium chloride  infusion, , Intravenous, Continuous, Acheampong, Maude POUR, MD, Last Rate: 40 mL/hr at 12/01/23 2243, New Bag at 12/01/23 2243   acetaminophen  (TYLENOL ) tablet 650 mg, 650 mg, Oral, Q4H PRN **OR** acetaminophen  (TYLENOL ) 160 MG/5ML solution 650 mg, 650 mg, Per Tube, Q4H PRN **OR** acetaminophen  (TYLENOL ) suppository 650 mg, 650 mg, Rectal, Q4H PRN, Acheampong, Maude POUR, MD   albuterol  (PROVENTIL ) (2.5 MG/3ML) 0.083% nebulizer solution 3 mL, 3 mL, Inhalation, Q6H PRN, Acheampong, Maude POUR, MD   [START ON 12/02/2023] apixaban  (ELIQUIS ) tablet 5 mg, 5 mg, Oral, BID, Acheampong, Peter K, MD   [START ON 12/02/2023] aspirin  EC tablet 81 mg, 81 mg, Oral, Daily, Acheampong, Peter K, MD   NOREEN ON 12/02/2023] colestipol  (COLESTID ) tablet 2 g, 2 g, Oral, Daily, Acheampong, Peter K, MD   NOREEN ON 12/02/2023] cyanocobalamin  (VITAMIN B12) tablet 5,000 mcg, 5,000 mcg, Oral, q AM, Acheampong, Peter K, MD   [START ON 12/02/2023] diphenhydrAMINE  (BENADRYL ) capsule 50 mg, 50 mg, Oral, Once **OR** [START ON 12/02/2023] diphenhydrAMINE  (BENADRYL ) injection 50 mg, 50 mg, Intravenous, Once, Opyd, Timothy S, MD   [START ON 12/02/2023] escitalopram  (LEXAPRO ) tablet 20 mg, 20 mg, Oral, q AM, Acheampong, Peter K, MD   NOREEN ON 12/02/2023] fluticasone  furoate-vilanterol (BREO ELLIPTA ) 200-25 MCG/ACT 1 puff, 1 puff, Inhalation, Daily, Acheampong, Maude POUR, MD   NOREEN ON 12/02/2023] furosemide  (LASIX ) tablet 20 mg, 20 mg, Oral, Daily, Acheampong, Peter K, MD   pantoprazole  (PROTONIX ) EC tablet 40 mg, 40 mg, Oral, BID, Acheampong, Peter K, MD   [START ON 12/02/2023] predniSONE  (DELTASONE ) tablet 50 mg, 50 mg, Oral, Q6H, Opyd, Timothy S, MD   [START ON 12/02/2023] rosuvastatin  (CRESTOR ) tablet 40 mg, 40 mg, Oral, QHS, Acheampong, Peter K, MD   sacubitril -valsartan  (ENTRESTO ) 24-26 mg per  tablet, 1 tablet, Oral,  BID, Acheampong, Peter K, MD   senna-docusate (Senokot-S) tablet 1 tablet, 1 tablet, Oral, QHS PRN, Marca Maude POUR, MD  Vitals   Vitals:   12/01/23 1945 12/01/23 2210 12/01/23 2228 12/01/23 2229  BP: (!) 116/59 (!) 110/58 (!) 117/58   Pulse: 72 67  (!) 54  Resp: 15 13 14 15   Temp:  97.7 F (36.5 C)  97.6 F (36.4 C)  TempSrc:  Oral  Oral  SpO2: 98% 97%  97%  Weight:      Height:        Body mass index is 30.05 kg/m.   Physical Exam   Constitutional: Appears well-developed and well-nourished.  Psych: Affect appropriate to situation.  Eyes: No scleral injection.  HENT: No OP obstruction.  Head: Normocephalic.  Cardiovascular: Normal rate and regular rhythm.  Respiratory: Effort normal, non-labored breathing.  GI: Soft.  No distension. There is no tenderness.  Skin: WDI.   Neurologic Examination   Mental status: alert, oriented to person, place and time. Able to provide history. Speech: no dysarthria, word-finding difficulty, paraphasic errors. Cranial nerves: PERRL EOMI VF full Face sensation intact bilaterally. Face symmetric at rest and with activation. Hearing grossly intact. Palate elevation symmetric Tongue protrudes midline and has full range of motion. SCM's full strength bilaterally. Motor: Normal bulk and tone. No abnormal movements RUE: shoulder abduction 5/5, biceps 5/5, triceps 5/5, wrist flexion 5/5, wrist extension 5/5, hand grip 5/5 LUE: shoulder abduction 5/5, biceps 5/5, triceps 5/5, wrist flexion 5/5, wrist extension 5/5, hand grip 5/5 RLE: hip flexion 5/5, knee flexion 5/5, knee extension 5/5, ankle dorsiflexion 5/5, plantar flexion 5/5 LLE: hip flexion 5/5, knee flexion 5/5, knee extension 5/5, ankle dorsiflexion 5/5, plantar flexion 5/5 Sensory: Grossly intact to light touch throughout. Reflexes: DTR's deferred. Downgoing toes bilaterally. Coordination: FTN intact. HTS intact. Gait: deferred  Labs/Imaging/Neurodiagnostic studies    CBC:  Recent Labs  Lab 12/01/23 1747 12/01/23 1753  WBC  --  7.1  NEUTROABS  --  3.7  HGB 14.3 14.3  HCT 42.0 43.0  MCV  --  93.3  PLT  --  231   Basic Metabolic Panel:  Lab Results  Component Value Date   NA 139 12/01/2023   K 3.8 12/01/2023   CO2 22 12/01/2023   GLUCOSE 120 (H) 12/01/2023   BUN 24 (H) 12/01/2023   CREATININE 0.90 12/01/2023   CALCIUM  9.8 12/01/2023   GFRNONAA >60 12/01/2023   GFRAA >60 12/31/2019   Lipid Panel:  Lab Results  Component Value Date   LDLCALC 91 05/20/2022   HgbA1c:  Lab Results  Component Value Date   HGBA1C 5.3 12/01/2023   Urine Drug Screen:     Component Value Date/Time   LABOPIA NONE DETECTED 05/19/2022 1722   COCAINSCRNUR NONE DETECTED 05/19/2022 1722   LABBENZ NONE DETECTED 05/19/2022 1722   AMPHETMU NONE DETECTED 05/19/2022 1722   THCU NONE DETECTED 05/19/2022 1722   LABBARB NONE DETECTED 05/19/2022 1722    Alcohol Level     Component Value Date/Time   ETH <15 12/01/2023 1745   INR  Lab Results  Component Value Date   INR 1.4 (H) 12/01/2023   APTT  Lab Results  Component Value Date   APTT 34 12/01/2023   AED levels: No results found for: PHENYTOIN, ZONISAMIDE, LAMOTRIGINE, LEVETIRACETA  CT Head without contrast(Personally reviewed): No acute abnormality.  ASSESSMENT   Paula Booker is a 78 y.o. female with hx of trigeminal neuralgia, migraines,  afib (on Eliquis ), WPW, CHF (s/p AICD), HTN, OSA, anxiety, depression who presents to the ED with transient episode of blurry vision, expressive aphasia, and headache. History of similar episodes as well as confirmation of what appears to be a scintillating scotoma on exam is consistent with a migraine. Intracranial stenosis can also present with similar events, especially during a hypoperfusing event.   RECOMMENDATIONS  - CTA head and neck w/wo to evaluate for intracranial stenosis - MRI brain wo contrast to evaluate for history of ischemic events -  No changes to her medications recommended. Please continue her Eliquis .  ______________________________________________________________________    Signed, Beacher Every M Kelven Flater, MD [facility]

## 2023-12-01 NOTE — ED Notes (Signed)
 LG, Lav, Blue & DG drawn and sent to main lab

## 2023-12-01 NOTE — ED Notes (Signed)
 Unable to do MRI due to pacemaker. MRI states they can do it Monday.

## 2023-12-01 NOTE — H&P (Addendum)
 History and Physical    Patient: Paula Booker FMW:[id number] DOB: February 01, 1946 DOA: 12/01/2023 DOS: the patient was seen and examined on 12/01/2023 PCP: Frederik Charleston, MD  Patient coming from: I had difficulty with word finding earlier today.  Chief Complaint:  Chief Complaint  Patient presents with   Visual Field Change   HPI: Paula Booker is a 78 y.o. is a right handed female with medical history significant of nonischemic cardiomyopathy/HFrEF, status post AICD, atrial fibrillation on anticoagulation with Eliquis , hypertension, migraine headaches, history of WPW syndrome, anxiety, depression and history of TIA/CVA in 05/2022.  She has been in her usual state of health and was talking with her friend earlier today when she suddenly felt brief episode of frontal headache associated with visual field blurring and difficulty with word finding and reading words.  Symptoms occurred whilst talking on the phone with her friend.  She had attempted to get up but was advised by friend to stay in the chair.  She decided to take a nap which lasted about 3 hours.  She woke up 3 hours later with resolution of her symptoms.  She denied any preceding aura but admitted having been working in the yard earlier in the day.  She decided to call her primary care physician who advised her to come to the ED to be further evaluated.  By the time, patient arrived in the ED, she hadno residual symptoms.  She did not require TN K as she did not have any symptoms and was out of the window.  She admitted to similar symptoms in February 2024.  She did not get an MRI at that time as she had recently had a pacemaker placement.  She is currently not on aspirin  but admits to being compliant with her Eliquis .  Review of Systems: As mentioned in the history of present illness. All other systems reviewed and are negative. Past Medical History:  Diagnosis Date   AICD (automatic cardioverter/defibrillator) present    Anemia     as a child   Anxiety    Arthritis    hands and knees (03/22/2012)   Asthma    Basal cell carcinoma 04/27/2011   mid forehead(MOHS)   Bursitis    CHF (congestive heart failure) (HCC) 09/2011   pt. denies   Depression    Dysrhythmia    WPW   GERD (gastroesophageal reflux disease)    Head injury, acute, with loss of consciousness (HCC)    History of hiatal hernia    Hypertension    ICD (implantable cardiac defibrillator) in place 03/2012   Migraines    migraines    Nausea vomiting and diarrhea 03/18/2015   NICM (nonischemic cardiomyopathy) (HCC)    Obesity (BMI 30-39.9)    negative sleep study 2014   OSA (obstructive sleep apnea) 05/27/2018   Mild with AHI 6/hr    no cpap   Pneumonia    hx   Shortness of breath    @ any time I can get SOB (03/22/2012)   Skin cancer 1999   Squamous cell carcinoma of skin 07/08/2020   in situ right malar cheek-[id number]   Sudden arrhythmia death syndrome    WPW (Wolff-Parkinson-White syndrome)    Past Surgical History:  Procedure Laterality Date   BIOPSY  09/07/2021   Procedure: BIOPSY;  Surgeon: Elicia Claw, MD;  Location: [facility];  Service: Gastroenterology;;   JUVENTINO LLANO N/A 05/16/2022   Procedure: BIV LLANO;  Surgeon: Waddell Danelle ORN, MD;  Location: [facility]  INVASIVE CV LAB;  Service: Cardiovascular;  Laterality: N/A;   CARDIAC CATHETERIZATION  09/18/11   no significant CAD   CARDIAC DEFIBRILLATOR PLACEMENT  03/22/2012   DDD   CHOLECYSTECTOMY  ~ 2003   ESOPHAGOGASTRODUODENOSCOPY (EGD) WITH PROPOFOL  N/A 09/07/2021   Procedure: ESOPHAGOGASTRODUODENOSCOPY (EGD) WITH PROPOFOL ;  Surgeon: Elicia Claw, MD;  Location: [facility];  Service: Gastroenterology;  Laterality: N/A;   IMPLANTABLE CARDIOVERTER DEFIBRILLATOR IMPLANT N/A 03/22/2012   Procedure: IMPLANTABLE CARDIOVERTER DEFIBRILLATOR IMPLANT;  Surgeon: Danelle LELON Birmingham, MD;  Location: [facility] CATH LAB;  Service: Cardiovascular;  Laterality: N/A;   KNEE ARTHROSCOPY Left 11/29/2017    Procedure: ARTHROSCOPY LEFT KNEE;  Surgeon: Yvone Rush, MD;  Location: [facility];  Service: Orthopedics;  Laterality: Left;   KNEE ARTHROSCOPY Right 08/09/2019   Procedure: ARTHROSCOPY RIGHT KNEE, CHONDROPLASTY,  PARTIAL MEDIAL  MENSICECTOMY;  Surgeon: Yvone Rush, MD;  Location: [facility];  Service: Orthopedics;  Laterality: Right;   KYPHOPLASTY N/A 11/27/2013   Procedure: KYPHOPLASTY;  Surgeon: Oneil Rodgers Priestly, MD;  Location: [facility];  Service: Orthopedics;  Laterality: N/A;  T9, T11 kyphoplasty   KYPHOPLASTY N/A 06/05/2014   Procedure: KYPHOPLASTY;  Surgeon: Oneil Rodgers Priestly, MD;  Location: [facility] OR;  Service: Orthopedics;  Laterality: N/A;  T7 kyphoplasty   MOHS SURGERY  06/2011   forehead (03/22/2012)   SKIN CANCER EXCISION  1999   top of my head and my right back shoulder   SKIN CANCER EXCISION     back   SKIN CANCER EXCISION     face   SUPRAVENTRICULAR TACHYCARDIA ABLATION  03/22/2012   unable to induce VT/RN report 03/22/2012   SUPRAVENTRICULAR TACHYCARDIA ABLATION N/A 03/22/2012   Procedure: SUPRAVENTRICULAR TACHYCARDIA ABLATION;  Surgeon: Danelle LELON Birmingham, MD;  Location: [facility] CATH LAB;  Service: Cardiovascular;  Laterality: N/A;   TONSILLECTOMY AND ADENOIDECTOMY  1950   TUBAL LIGATION  1978   Social History:  reports that she has never smoked. She has never used smokeless tobacco. She reports that she does not currently use alcohol. She reports that she does not use drugs.  Allergies  Allergen Reactions   Ivp Dye [Iodinated Contrast Media] Hives and Other (See Comments)    pt tolerates with benadryl    Penicillins Anaphylaxis    Has patient had a PCN reaction causing immediate rash, facial/tongue/throat swelling, SOB or lightheadedness with hypotension: Yes   Tessalon [Benzonatate] Hives and Rash    Severe rash   Sulfa Antibiotics Other (See Comments)    Migraines   Sulfonamide Derivatives Other (See Comments)    Migraines   Rosuvastatin  Calcium  Itching    Family  History  Problem Relation Age of Onset   Congestive Heart Failure Mother        died 81   Kidney Stones Mother    Deafness Mother    Atrial fibrillation Mother    Congestive Heart Failure Father    Deafness Father    Healthy Sister    Healthy Sister    Healthy Sister    Congestive Heart Failure Brother    Atrial fibrillation Brother    Tuberculosis Paternal Grandfather    Migraines Neg Hx    Neural tube defect Neg Hx     Prior to Admission medications   Medication Sig Start Date End Date Taking? Authorizing Provider  acetaminophen  (TYLENOL ) 500 MG tablet Take 500 mg by mouth in the morning and at bedtime.   Yes [provider]  albuterol  (VENTOLIN  HFA) 108 (90 Base) MCG/ACT inhaler INHALE 2 PUFFS  BY MOUTH EVERY 6 HOURS AS NEEDED FOR WHEEZING OR SHORTNESS OF BREATH 07/11/23  Yes Mannam, Praveen, MD  apixaban  (ELIQUIS ) 5 MG TABS tablet Take 1 tablet by mouth twice daily 09/07/23  Yes Dann Candyce RAMAN, MD  Calcium  Carb-Cholecalciferol  600-20 MG-MCG TABS Take 1 tablet by mouth in the morning.   Yes [provider]  carvedilol  (COREG ) 6.25 MG tablet Take 1 tablet by mouth twice daily 06/15/23  Yes Waddell Danelle ORN, MD  cetirizine (ZYRTEC) 10 MG tablet Take 10 mg by mouth in the morning.   Yes [provider]  Cholecalciferol  (VITAMIN D3) 5000 units CAPS Take 5,000 Units by mouth in the morning.   Yes [provider]  colestipol  (COLESTID ) 1 g tablet Take 2 tablets (2 g total) by mouth daily. 10/10/23 11/30/24 Yes Jillian Buttery, MD  Collagen-Vitamin C  (COLLAGEN PLUS VITAMIN C  PO) Take 1 tablet by mouth in the morning.   Yes [provider]  Cyanocobalamin  (VITAMIN B-12) 5000 MCG LOZG Take 5,000 mcg by mouth in the morning.   Yes [provider]  escitalopram  (LEXAPRO ) 20 MG tablet Take 20 mg by mouth in the morning. 10/23/20  Yes [provider]  fluticasone  furoate-vilanterol (BREO ELLIPTA ) 200-25 MCG/ACT AEPB Inhale 1 puff by  mouth once daily 06/14/23  Yes Mannam, Praveen, MD  furosemide  (LASIX ) 40 MG tablet TAKE 1 TO 2 TABLETS BY MOUTH AS DIRECTED ONCE DAILY FOR  EDEMA,  SHORTNESS  OF  BREATH  OR  WEIGHT  GAIN. 12/14/22  Yes Waddell Danelle ORN, MD  ipratropium-albuterol  (DUONEB) 0.5-2.5 (3) MG/3ML SOLN Take 3 mLs by nebulization every 4 (four) hours as needed. 06/03/22  Yes Mannam, Praveen, MD  MOUNJARO 2.5 MG/0.5ML Pen Inject 2.5 mg into the skin once a week. 08/02/23  Yes [provider]  Multiple Vitamin (MULTIVITAMIN WITH MINERALS) TABS tablet Take 1 tablet by mouth in the morning.   Yes [provider]  pantoprazole  (PROTONIX ) 40 MG tablet Take 40 mg by mouth 2 (two) times daily. 08/29/17  Yes [provider]  rosuvastatin  (CRESTOR ) 5 MG tablet Take 5 mg by mouth at bedtime. 11/18/20  Yes [provider]  sacubitril -valsartan  (ENTRESTO ) 24-26 MG Take 1 tablet by mouth twice daily 09/07/23  Yes Waddell Danelle ORN, MD  spironolactone  (ALDACTONE ) 25 MG tablet Take 1 tablet by mouth once daily 10/26/23  Yes Waddell Danelle ORN, MD  Zinc  100 MG TABS Take 1 tablet by mouth daily at 6 (six) AM.   Yes [provider]    Physical Exam: Vitals:   12/01/23 1742 12/01/23 1748 12/01/23 1945 12/01/23 2210  BP:  111/68 (!) 116/59 (!) 110/58  Pulse:  79 72 67  Resp:  (!) 21 15 13   Temp:  97.6 F (36.4 C)  97.7 F (36.5 C)  TempSrc:    Oral  SpO2:  99% 98% 97%  Weight: 77.6 kg     Height: 5' 3.25 (1.607 m)      General: Patient is alert oriented x 3.  She looks well for her age. HEENT: She wears glasses.  Pupils equal round reactive to light accommodation. Neck: Supple with no no JVD Chest: Clinically diminished without any added sounds Cardiovascular: Regular S1-S2 with no murmur. Abdomen: Soft nontender with no organomegaly CNS: No obvious focal deficits.  NIHSS was 0 Skin is negative for any new rash. Data Reviewed: Labs reviewed thus far unremarkable. CT head ruled out any acute  intracranial abnormality. MRI was ordered per protocol but  due to history of AICD placement, will defer to cardiology for clearance. Will order for CTA head and neck Recent echocardiogram from June 2025 shows EF of 40 to 45%.  Assessment and Plan: 78 year old pleasant female with history of hypertension, atrial fibrillation, WPW syndrome, status post AICD who presents with transient visual field impairment and difficulty with word finding.  Symptoms were concerning for TIA.  Symptoms are since resolved.  Patient was admitted for workup for stroke.  #1. TIA: transient peripheral visual loss and difficulty with word finding: Signs and symptoms consistent with TIA versus stroke.  Other differentials include migraine headache with sequelae.  This.  This admission patient will be initiated on aspirin .  Continue with high-dose statins.  Allow permissive hypertension for now.  Will pursue CTA head and neck and MRI if pacemaker compatible.  #2 Atrial fibrillation: Status post pacemaker.  Rate and rhythm within normal limits. -Continue anticoagulation with Eliquis .  #3.  HFrEF status post CRT-D - Patient looks compensated. Remains stable on goal-directed medical therapy-with Coreg , spironolactone , Entresto  and Lasix .  Will hold off on Coreg  and spironolactone  due to permissive hypertension at this time.  #4. History of asthma.  Stable on  #5.Hyperlipidemia: Patient has been on rosuvastatin .  Will increase dose  #6. Anxiety and Depression:Patient is on Lexapro .   Advance Care Planning:   Code Status: Full Code   Consults:   Family Communication:   Severity of Illness: The appropriate patient status for this patient is OBSERVATION. Observation status is judged to be reasonable and necessary in order to provide the required intensity of service to ensure the patient's safety. The patient's presenting symptoms, physical exam findings, and initial radiographic and laboratory data in the context of  their medical condition is felt to place them at decreased risk for further clinical deterioration. Furthermore, it is anticipated that the patient will be medically stable for discharge from the hospital within 2 midnights of admission.   Author: Maude MARLA Dart, MD 12/01/2023 10:13 PM  For on call review [email or web address].

## 2023-12-01 NOTE — ED Triage Notes (Signed)
 The pt had visual difficulty around 1330 she went to sleep and woke up around 1700  she called her doctor and he told her to come to the ed

## 2023-12-01 NOTE — ED Provider Notes (Signed)
  EMERGENCY DEPARTMENT AT [facility] Provider Note   CSN: [id number] Arrival date & time: 12/01/23  1737     Patient presents with: Visual Field Change   Paula Booker is a 78 y.o. female.   HPI   Patient has history of asthma acid reflux hypertension ICD nonischemic cardiomyopathy, CHF, stroke.  Patient states this afternoon sometime between 130 and 2 she had sudden onset of difficulty speaking and visual difficulty.  Patient states she also developed a headache.  Patient was trying to read and she was unable do that.  She was also having difficulty forming the words.  Patient ended up taking a nap.  She woke up around 5 PM.  Her symptoms had mostly resolved.  She called her doctor instructed her to come to the ED.  Patient states her symptoms have resolved at this point.  Prior to Admission medications   Medication Sig Start Date End Date Taking? Authorizing Provider  acetaminophen  (TYLENOL ) 500 MG tablet Take 500 mg by mouth in the morning and at bedtime.    [provider]  albuterol  (VENTOLIN  HFA) 108 (90 Base) MCG/ACT inhaler INHALE 2 PUFFS BY MOUTH EVERY 6 HOURS AS NEEDED FOR WHEEZING OR SHORTNESS OF BREATH 07/11/23   Mannam, Praveen, MD  apixaban  (ELIQUIS ) 5 MG TABS tablet Take 1 tablet by mouth twice daily 09/07/23   Dann Candyce RAMAN, MD  Calcium  Carb-Cholecalciferol  600-20 MG-MCG TABS Take 1 tablet by mouth in the morning.    [provider]  carvedilol  (COREG ) 6.25 MG tablet Take 1 tablet by mouth twice daily 06/15/23   Waddell Danelle ORN, MD  cetirizine (ZYRTEC) 10 MG tablet Take 10 mg by mouth in the morning.    [provider]  Cholecalciferol  (VITAMIN D3) 5000 units CAPS Take 5,000 Units by mouth in the morning.    [provider]  colestipol  (COLESTID ) 1 g tablet Take 2 tablets (2 g total) by mouth daily. 10/10/23 11/09/23  Jillian Buttery, MD  Collagen-Vitamin C  (COLLAGEN PLUS VITAMIN C  PO) Take 1 tablet by mouth in the  morning.    [provider]  Cyanocobalamin  (VITAMIN B-12) 5000 MCG LOZG Take 5,000 mcg by mouth in the morning.    [provider]  escitalopram  (LEXAPRO ) 20 MG tablet Take 20 mg by mouth in the morning. 10/23/20   [provider]  fluticasone  furoate-vilanterol (BREO ELLIPTA ) 200-25 MCG/ACT AEPB Inhale 1 puff by mouth once daily 06/14/23   Mannam, Praveen, MD  furosemide  (LASIX ) 40 MG tablet TAKE 1 TO 2 TABLETS BY MOUTH AS DIRECTED ONCE DAILY FOR  EDEMA,  SHORTNESS  OF  BREATH  OR  WEIGHT  GAIN. 12/14/22   Waddell Danelle ORN, MD  ipratropium-albuterol  (DUONEB) 0.5-2.5 (3) MG/3ML SOLN Take 3 mLs by nebulization every 4 (four) hours as needed. 06/03/22   Mannam, Praveen, MD  MOUNJARO 2.5 MG/0.5ML Pen Inject 2.5 mg into the skin once a week. 08/02/23   [provider]  Multiple Vitamin (MULTIVITAMIN WITH MINERALS) TABS tablet Take 1 tablet by mouth in the morning.    [provider]  pantoprazole  (PROTONIX ) 40 MG tablet Take 40 mg by mouth 2 (two) times daily. 08/29/17   [provider]  rosuvastatin  (CRESTOR ) 5 MG tablet Take 5 mg by mouth at bedtime. 11/18/20   [provider]  sacubitril -valsartan  (ENTRESTO ) 24-26 MG Take 1 tablet by mouth twice daily 09/07/23   Waddell Danelle ORN, MD  spironolactone  (ALDACTONE ) 25 MG tablet Take 1  tablet by mouth once daily 10/26/23   Waddell Danelle ORN, MD  Zinc  100 MG TABS Take 1 tablet by mouth daily at 6 (six) AM.    [provider]    Allergies: Ivp dye [iodinated contrast media], Penicillins, Sulfonamide derivatives, Tessalon [benzonatate], Sulfa antibiotics, and Rosuvastatin  calcium     Review of Systems  Updated Vital Signs BP 111/68 (BP Location: Right Arm)   Pulse 79   Temp 97.6 F (36.4 C)   Resp (!) 21   Ht 1.607 m (5' 3.25)   Wt 77.6 kg   SpO2 99%   BMI 30.05 kg/m   Physical Exam Vitals and nursing note reviewed.  Constitutional:      General: She is not in acute distress.     Appearance: She is well-developed.  HENT:     Head: Normocephalic and atraumatic.     Right Ear: External ear normal.     Left Ear: External ear normal.  Eyes:     General: No visual field deficit or scleral icterus.       Right eye: No discharge.        Left eye: No discharge.     Conjunctiva/sclera: Conjunctivae normal.  Neck:     Trachea: No tracheal deviation.  Cardiovascular:     Rate and Rhythm: Normal rate and regular rhythm.  Pulmonary:     Effort: Pulmonary effort is normal. No respiratory distress.     Breath sounds: Normal breath sounds. No stridor. No wheezing or rales.  Abdominal:     General: Bowel sounds are normal. There is no distension.     Palpations: Abdomen is soft.     Tenderness: There is no abdominal tenderness. There is no guarding or rebound.  Musculoskeletal:        General: No tenderness.     Cervical back: Neck supple.  Skin:    General: Skin is warm and dry.     Findings: No rash.  Neurological:     Mental Status: She is alert and oriented to person, place, and time.     Cranial Nerves: No cranial nerve deficit, dysarthria or facial asymmetry.     Sensory: No sensory deficit.     Motor: No abnormal muscle tone, seizure activity or pronator drift.     Coordination: Coordination normal.     Comments:  able to hold both legs off bed for 5 seconds, sensation intact in all extremities,  no left or right sided neglect, normal finger-nose exam bilaterally, no nystagmus noted   Psychiatric:        Mood and Affect: Mood normal.     (all labs ordered are listed, but only abnormal results are displayed) Labs Reviewed  PROTIME-INR - Abnormal; Notable for the following components:      Result Value   Prothrombin Time 17.9 (*)    INR 1.4 (*)    All other components within normal limits  COMPREHENSIVE METABOLIC PANEL WITH GFR - Abnormal; Notable for the following components:   Glucose, Bld 120 (*)    BUN 24 (*)    All other components within normal  limits  CBG MONITORING, ED - Abnormal; Notable for the following components:   Glucose-Capillary 123 (*)    All other components within normal limits  I-STAT CHEM 8, ED - Abnormal; Notable for the following components:   BUN 27 (*)    Glucose, Bld 122 (*)    Calcium , Ion 1.12 (*)    All other components within normal  limits  ETHANOL  APTT  CBC  DIFFERENTIAL  RAPID URINE DRUG SCREEN, HOSP PERFORMED    EKG: EKG Interpretation Date/Time:  Friday December 01 2023 17:58:57 EDT Ventricular Rate:  78 PR Interval:  136 QRS Duration:  118 QT Interval:  438 QTC Calculation: 499 R Axis:   -43  Text Interpretation: Atrial-sensed ventricular-paced rhythm Abnormal ECG When compared with ECG of 08-Oct-2023 17:14, No significant change since last tracing Confirmed by Randol Simmonds [id number]) on 12/01/2023 7:09:10 PM  Radiology: CT HEAD WO CONTRAST Result Date: 12/01/2023 CLINICAL DATA:  Neurological deficit EXAM: CT HEAD WITHOUT CONTRAST TECHNIQUE: Contiguous axial images were obtained from the base of the skull through the vertex without intravenous contrast. RADIATION DOSE REDUCTION: This exam was performed according to the departmental dose-optimization program which includes automated exposure control, adjustment of the mA and/or kV according to patient size and/or use of iterative reconstruction technique. COMPARISON:  12/03/2022 FINDINGS: Brain: No evidence of acute infarction, hemorrhage, hydrocephalus, extra-axial collection or mass lesion/mass effect. Vascular: No hyperdense vessel or unexpected calcification. Skull: Normal. Negative for fracture or focal lesion. Sinuses/Orbits: No acute finding. Other: None. IMPRESSION: No acute intracranial abnormality noted. Electronically Signed   By: Oneil Devonshire M.D.   On: 12/01/2023 19:09     Procedures   Medications Ordered in the ED - No data to display  Clinical Course as of 12/02/23 0928  Fri Dec 01, 2023  1921 Head CT without acute abnormality  [JK]  1927 Case discussed with neuro hospitalist Dr Sallyann.  Recc admission, further workup [JK]  2016 Case discussed with Dr. DAVIA    Clinical Course User Index [JK] Randol Simmonds, MD                                 Medical Decision Making Differential diagnosis includes but not limited to complex migraine, TIA, stroke  Problems Addressed: TIA (transient ischemic attack): acute illness or injury that poses a threat to life or bodily functions  Amount and/or Complexity of Data Reviewed Labs: ordered. Decision-making details documented in ED Course. Radiology: ordered and independent interpretation performed.  Risk Decision regarding hospitalization.   Patient presented to ED with complaints of acute change in her vision as well as her speech.  Symptoms have resolved at this point.  ED workup reassuring.  No signs of cerebral hemorrhage.  No acute stroke noted on CT.  No signs of anemia or electrolyte abnormalities.  Concerned about the possibility of TIA with her visual complaints and speech disturbance.  I discussed the case with Dr. Sallyann neurology.  Does recommend admission for further TIA workup.  Patient is not able to get an MRI at this time as she does have an AICD.  Will need to confirm that her AICD is MRI compatible and if so will need cardiology available to recheck the AICD after the MRI.     Final diagnoses:  TIA (transient ischemic attack)    ED Discharge Orders     None          Randol Simmonds, MD 12/02/23 [phone]

## 2023-12-02 ENCOUNTER — Encounter (HOSPITAL_COMMUNITY): Payer: Self-pay

## 2023-12-02 ENCOUNTER — Observation Stay (HOSPITAL_COMMUNITY)

## 2023-12-02 DIAGNOSIS — I639 Cerebral infarction, unspecified: Secondary | ICD-10-CM

## 2023-12-02 DIAGNOSIS — Z95810 Presence of automatic (implantable) cardiac defibrillator: Secondary | ICD-10-CM | POA: Diagnosis not present

## 2023-12-02 DIAGNOSIS — G43109 Migraine with aura, not intractable, without status migrainosus: Secondary | ICD-10-CM

## 2023-12-02 DIAGNOSIS — R29818 Other symptoms and signs involving the nervous system: Secondary | ICD-10-CM | POA: Diagnosis not present

## 2023-12-02 DIAGNOSIS — I700 Atherosclerosis of aorta: Secondary | ICD-10-CM | POA: Diagnosis not present

## 2023-12-02 DIAGNOSIS — G459 Transient cerebral ischemic attack, unspecified: Secondary | ICD-10-CM | POA: Diagnosis not present

## 2023-12-02 DIAGNOSIS — I672 Cerebral atherosclerosis: Secondary | ICD-10-CM | POA: Diagnosis not present

## 2023-12-02 DIAGNOSIS — Z950 Presence of cardiac pacemaker: Secondary | ICD-10-CM | POA: Diagnosis not present

## 2023-12-02 LAB — LIPID PANEL
Cholesterol: 135 mg/dL (ref 0–200)
HDL: 42 mg/dL (ref >40)
LDL Cholesterol: 77 mg/dL (ref 0–99)
Total CHOL/HDL Ratio: 3.2 ratio
Triglycerides: 78 mg/dL (ref <150)
VLDL: 16 mg/dL (ref 0–40)

## 2023-12-02 MED ORDER — IOHEXOL 350 MG/ML SOLN
75.0000 mL | Freq: Once | INTRAVENOUS | Status: AC | PRN
  Administered 2023-12-02: 75 mL via INTRAVENOUS

## 2023-12-02 NOTE — Progress Notes (Signed)
 SLP Cancellation Note  Patient Details Name: NELLE SAYED MRN: [id number] DOB: 21-Aug-1945   Cancelled treatment:       Reason Eval/Treat Not Completed: SLP screened, no needs identified, will sign off Patient feels she is back to normal. SLP did not observe any speech, language or cognitive impairments.   Norleen IVAR Blase, MA, CCC-SLP Speech Therapy

## 2023-12-02 NOTE — Progress Notes (Signed)
 OT Cancellation Note  Patient Details Name: Paula Booker MRN: [id number] DOB: [birth year]   Cancelled Treatment:    Reason Eval/Treat Not Completed: OT screened, no needs identified, will sign off PT evaluated pt this morning and reports pt is back to baseline. Per PT report, pt is functioning at independent level and reports vision and cognition are at baseline. OT screened, no acute OT needs identified at this time. OT signing off.    Paula Booker 12/02/2023, 8:53 AM

## 2023-12-02 NOTE — Discharge Summary (Signed)
 PATIENT DETAILS Name: Paula Booker Age: 78 y.o. Sex: female Date of Birth: 10-31-1945 MRN: [id number]. Admitting Physician: Maude MARLA Dart, MD ERE:Uyjrxzm, Lamar, MD  Admit Date: 12/01/2023 Discharge date: 12/02/2023  Recommendations for Outpatient Follow-up:  Follow up with PCP in 1-2 weeks Please obtain CMP/CBC in one week  Admitted From:  Home  Disposition: Home   Discharge Condition: good  CODE STATUS:   Code Status: Full Code   Diet recommendation:  Diet Order             Diet - low sodium heart healthy           Diet regular Fluid consistency: Thin  Diet effective now                    Brief Summary: 78 year old with history of migraine headaches, A-fib on Eliquis , HFrEF-s/p ICD, WPW syndrome who presented with a transient episode of blurry vision/expressive aphasia and headache.  She was thought to have either a complicated migraine versus TIA.  She was subsequently admitted to the hospitalist service.  Significant studies 6/30>> echo: EF 40-45% 8/22>> CT head: No acute intracranial abnormality. 8/23>> CT angio head/neck: No LVO-no significant stenosis. 8/22>> A1c: 5.7 8/23>> repeat CT head: No acute intracranial abnormality. 8/23>> LDL: 77  Brief Hospital Course: Transient episode of blurry vision/expressive aphasia and headache Symptoms have completely resolved Suspect this is probably complicated migraine rather than TIA/CVA Unable to get MRI due to ICD Workup as above No further recommendations recommended by neurology.  Chronic HFrEF Euvolemic Has ICD in place Continue GDMT medications as previous  PAF Continue Eliquis   HLD Statin  Asthma Not in exacerbation Stable Continue usual bronchodilator regimen  Anxiety/depression Stable Continue Lexapro   Class 1 Obesity: Estimated body mass index is 30.05 kg/m as calculated from the following:   Height as of this encounter: 5' 3.25 (1.607 m).   Weight as of this  encounter: 77.6 kg.    Discharge Diagnoses:  Principal Problem:   TIA (transient ischemic attack) Active Problems:   Complicated migraine   Discharge Instructions:  Activity:  As tolerated   Discharge Instructions     Ambulatory referral to Neurology   Complete by: As directed    An appointment is requested in approximately: 4 weeks   Call MD for:  persistant nausea and vomiting   Complete by: As directed    Call MD for:  severe uncontrolled pain   Complete by: As directed    Diet - low sodium heart healthy   Complete by: As directed    Discharge instructions   Complete by: As directed    Follow with Primary MD  Frederik Lamar, MD in 1-2 weeks  Please get a complete blood count and chemistry panel checked by your Primary MD at your next visit, and again as instructed by your Primary MD.  Get Medicines reviewed and adjusted: Please take all your medications with you for your next visit with your Primary MD  Laboratory/radiological data: Please request your Primary MD to go over all hospital tests and procedure/radiological results at the follow up, please ask your Primary MD to get all Hospital records sent to his/her office.  In some cases, they will be blood work, cultures and biopsy results pending at the time of your discharge. Please request that your primary care M.D. follows up on these results.  Also Note the following: If you experience worsening of your admission symptoms, develop shortness of breath, life threatening emergency,  suicidal or homicidal thoughts you must seek medical attention immediately by calling 911 or calling your MD immediately  if symptoms less severe.  You must read complete instructions/literature along with all the possible adverse reactions/side effects for all the Medicines you take and that have been prescribed to you. Take any new Medicines after you have completely understood and accpet all the possible adverse reactions/side effects.    Do not drive when taking Pain medications or sleeping medications (Benzodaizepines)  Do not take more than prescribed Pain, Sleep and Anxiety Medications. It is not advisable to combine anxiety,sleep and pain medications without talking with your primary care practitioner  Special Instructions: If you have smoked or chewed Tobacco  in the last 2 yrs please stop smoking, stop any regular Alcohol  and or any Recreational drug use.  Wear Seat belts while driving.  Please note: You were cared for by a hospitalist during your hospital stay. Once you are discharged, your primary care physician will handle any further medical issues. Please note that NO REFILLS for any discharge medications will be authorized once you are discharged, as it is imperative that you return to your primary care physician (or establish a relationship with a primary care physician if you do not have one) for your post hospital discharge needs so that they can reassess your need for medications and monitor your lab values.   Increase activity slowly   Complete by: As directed       Allergies as of 12/02/2023       Reactions   Ivp Dye [iodinated Contrast Media] Hives, Other (See Comments)   pt tolerates with benadryl    Penicillins Anaphylaxis   Has patient had a PCN reaction causing immediate rash, facial/tongue/throat swelling, SOB or lightheadedness with hypotension: Yes   Tessalon [benzonatate] Hives, Rash   Severe rash   Sulfa Antibiotics Other (See Comments)   Migraines   Sulfonamide Derivatives Other (See Comments)   Migraines   Rosuvastatin  Calcium  Itching        Medication List     TAKE these medications    acetaminophen  500 MG tablet Commonly known as: TYLENOL  Take 500 mg by mouth in the morning and at bedtime.   albuterol  108 (90 Base) MCG/ACT inhaler Commonly known as: VENTOLIN  HFA INHALE 2 PUFFS BY MOUTH EVERY 6 HOURS AS NEEDED FOR WHEEZING OR SHORTNESS OF BREATH   Breo Ellipta  200-25  MCG/ACT Aepb Generic drug: fluticasone  furoate-vilanterol Inhale 1 puff by mouth once daily   Calcium  Carb-Cholecalciferol  600-20 MG-MCG Tabs Take 1 tablet by mouth in the morning.   carvedilol  6.25 MG tablet Commonly known as: COREG  Take 1 tablet by mouth twice daily   cetirizine 10 MG tablet Commonly known as: ZYRTEC Take 10 mg by mouth in the morning.   colestipol  1 g tablet Commonly known as: COLESTID  Take 2 tablets (2 g total) by mouth daily.   COLLAGEN PLUS VITAMIN C  PO Take 1 tablet by mouth in the morning.   Eliquis  5 MG Tabs tablet Generic drug: apixaban  Take 1 tablet by mouth twice daily   Entresto  24-26 MG Generic drug: sacubitril -valsartan  Take 1 tablet by mouth twice daily   escitalopram  20 MG tablet Commonly known as: LEXAPRO  Take 20 mg by mouth in the morning.   furosemide  40 MG tablet Commonly known as: LASIX  TAKE 1 TO 2 TABLETS BY MOUTH AS DIRECTED ONCE DAILY FOR  EDEMA,  SHORTNESS  OF  BREATH  OR  WEIGHT  GAIN.   ipratropium-albuterol  0.5-2.5 (  3) MG/3ML Soln Commonly known as: DUONEB Take 3 mLs by nebulization every 4 (four) hours as needed.   Mounjaro 2.5 MG/0.5ML Pen Generic drug: tirzepatide Inject 2.5 mg into the skin once a week.   multivitamin with minerals Tabs tablet Take 1 tablet by mouth in the morning.   pantoprazole  40 MG tablet Commonly known as: PROTONIX  Take 40 mg by mouth 2 (two) times daily.   rosuvastatin  5 MG tablet Commonly known as: CRESTOR  Take 5 mg by mouth at bedtime.   spironolactone  25 MG tablet Commonly known as: ALDACTONE  Take 1 tablet by mouth once daily   Vitamin B-12 5000 MCG Lozg Take 5,000 mcg by mouth in the morning.   Vitamin D3 125 MCG (5000 UT) Caps Take 5,000 Units by mouth in the morning.   Zinc  100 MG Tabs Take 1 tablet by mouth daily at 6 (six) AM.        Follow-up Information     Frederik Charleston, MD. Schedule an appointment as soon as possible for a visit in 1 week(s).   Specialty:  Family Medicine Contact information: [address] [place] [address] [phone]         [facility] Follow up.   Specialty: Neurology Why: Office will call with date/time, If you dont hear from them,please give them a call Contact information: [address] [place] [place]  [address] [phone]               Allergies  Allergen Reactions   Ivp Dye [Iodinated Contrast Media] Hives and Other (See Comments)    pt tolerates with benadryl    Penicillins Anaphylaxis    Has patient had a PCN reaction causing immediate rash, facial/tongue/throat swelling, SOB or lightheadedness with hypotension: Yes   Tessalon [Benzonatate] Hives and Rash    Severe rash   Sulfa Antibiotics Other (See Comments)    Migraines   Sulfonamide Derivatives Other (See Comments)    Migraines   Rosuvastatin  Calcium  Itching     Other Procedures/Studies: CT HEAD WO CONTRAST ( ) Result Date: 12/02/2023 CLINICAL DATA:  78 year old female neurologic deficit. EXAM: CT HEAD WITHOUT CONTRAST TECHNIQUE: Contiguous axial images were obtained from the base of the skull through the vertex without intravenous contrast. RADIATION DOSE REDUCTION: This exam was performed according to the departmental dose-optimization program which includes automated exposure control, adjustment of the mA and/or kV according to patient size and/or use of iterative reconstruction technique. COMPARISON:  CTA head and neck this morning, head CT yesterday and earlier. FINDINGS: Brain: Cerebral volume remains normal for age. Left inferior lentiform perivascular space, normal variant. No midline shift, ventriculomegaly, mass effect, evidence of mass lesion, intracranial hemorrhage or evidence of cortically based acute infarction. Gray-white matter differentiation is within normal limits throughout the brain. Vascular: No suspicious intracranial vascular hyperdensity. Skull: Stable  and intact.  No acute osseous abnormality identified. Sinuses/Orbits: Visualized paranasal sinuses and mastoids are stable and well aerated. Other: Stable, negative orbits and scalp soft tissues. IMPRESSION: Stable and normal for age noncontrast CT appearance of the brain. Electronically Signed   By: VEAR Hurst M.D.   On: 12/02/2023 12:40   CT ANGIO HEAD NECK W WO CM (CODE STROKE) Result Date: 12/02/2023 CLINICAL DATA:  78 year old female with neurologic deficit. EXAM: CT ANGIOGRAPHY HEAD AND NECK TECHNIQUE: Multidetector CT imaging of the head and neck was performed using the standard protocol during bolus administration of intravenous contrast. Multiplanar CT image reconstructions and MIPs were obtained  to evaluate the vascular anatomy. Carotid stenosis measurements (when applicable) are obtained utilizing NASCET criteria, using the distal internal carotid diameter as the denominator. RADIATION DOSE REDUCTION: This exam was performed according to the departmental dose-optimization program which includes automated exposure control, adjustment of the mA and/or kV according to patient size and/or use of iterative reconstruction technique. CONTRAST:  75mL OMNIPAQUE  IOHEXOL  350 MG/ML SOLN COMPARISON:  Head CT yesterday.  Prior CTA head and neck 05/19/2022. FINDINGS: CTA NECK Skeleton: Absent maxillary dentition. Hyperostosis of the calvarium. Mild for age cervical spine degeneration. No acute osseous abnormality identified. Upper chest: Left chest cardiac pacemaker redemonstrated. Mild streak artifact. Negative upper lungs. Other neck: Nonvascular neck soft tissue spaces are within normal limits. Aortic arch: 3 vessel arch.  Stable minimal arch atherosclerosis. Right carotid system: Tortuous brachiocephalic artery and proximal right CCA. Patent right carotid bifurcation. Tortuous right ICA distal to the bulb. No plaque or stenosis. Left carotid system: Mild tortuosity. Stable from the CTA last year with no plaque or  stenosis. Vertebral arteries: Calcified plaque of the proximal right subclavian artery without stenosis. Normal right vertebral artery origin. Mildly tortuous right vertebral artery is patent to the skull base, stable with no plaque or stenosis identified. Proximal left subclavian artery and left vertebral artery origin are normal. Tortuous left V1 segment. Surrounding proximal paravertebral venous contrast reflux similar to the CTA last year. Left vertebral artery appears to be codominant in the neck, patent to the skull base with no plaque or stenosis. However, there is a mildly beaded appearance of the left V2/V3 segment junction on series 5, image 174. This is more apparent than last year. CTA HEAD Posterior circulation: Stable and patent distal vertebral arteries and vertebrobasilar junction with no discrete plaque or stenosis. Mildly tapered appearance of the distal vertebral arteries is unchanged. Both AICA appear to be dominant and patent. Patent basilar artery which is somewhat diminutive, stable, without stenosis. Patent SCA and PCA origins. Both posterior communicating arteries are present, stable. Bilateral PCA branches are stable and within normal limits. Anterior circulation: Both ICA siphons are patent. No left siphon plaque or stenosis. Right ICA siphon appears mildly dominant and tortuous. Mild right siphon calcified plaque without stenosis. Both posterior communicating artery origins are patent. Bilateral carotid termini, MCA and ACA origins appears stable and normal. Normal anterior communicating artery. Right ACA A1 segment is mildly dominant. Bilateral ACA branches are stable and within normal limits. Left MCA M1 segment and trifurcation are patent without stenosis. Right MCA M1 segment and bifurcation are patent without stenosis. Bilateral MCA branches are stable and within normal limits. Venous sinuses: Early contrast timing, not evaluated. Anatomic variants: Mildly diminutive vertebrobasilar  system, mildly dominant left vertebral V4 segment, mildly dominant right ACA A1. Review of the MIP images confirms the above findings IMPRESSION: 1. Negative for large vessel occlusion. Stable CTA since last year with very mild for age atherosclerosis in the head and neck with no arterial stenosis. 2. However, possible Fibromuscular Dysplasia (FMD) of the vertebral arteries at the skull base, more so the left. 3. Minimal aortic arch atherosclerosis.  Left chest pacemaker. Electronically Signed   By: VEAR Hurst M.D.   On: 12/02/2023 05:31   CT HEAD WO CONTRAST Result Date: 12/01/2023 CLINICAL DATA:  Neurological deficit EXAM: CT HEAD WITHOUT CONTRAST TECHNIQUE: Contiguous axial images were obtained from the base of the skull through the vertex without intravenous contrast. RADIATION DOSE REDUCTION: This exam was performed according to the departmental dose-optimization program which includes  automated exposure control, adjustment of the mA and/or kV according to patient size and/or use of iterative reconstruction technique. COMPARISON:  12/03/2022 FINDINGS: Brain: No evidence of acute infarction, hemorrhage, hydrocephalus, extra-axial collection or mass lesion/mass effect. Vascular: No hyperdense vessel or unexpected calcification. Skull: Normal. Negative for fracture or focal lesion. Sinuses/Orbits: No acute finding. Other: None. IMPRESSION: No acute intracranial abnormality noted. Electronically Signed   By: Oneil Devonshire M.D.   On: 12/01/2023 19:09   CUP PACEART REMOTE DEVICE CHECK Result Date: 11/16/2023 ICD Scheduled remote reviewed. Normal device function.  Presenting rhythm:  AS/BiV pace with PVC's HF diagnostics currenlty abnormal, follwed by ICM Next remote 91 days. LA, CVRS    TODAY-DAY OF DISCHARGE:  Subjective:   Ozell Ferrera today has no headache,no chest abdominal pain,no new weakness tingling or numbness, feels much better wants to go home today.   Objective:   Blood pressure 105/63,  pulse 88, temperature 97.7 F (36.5 C), temperature source Oral, resp. rate (!) 23, height 5' 3.25 (1.607 m), weight 77.6 kg, SpO2 96%. No intake or output data in the 24 hours ending 12/02/23 1312 Filed Weights   12/01/23 1742  Weight: 77.6 kg    Exam: Awake Alert, Oriented *3, No new F.N deficits, Normal affect [place].AT,PERRAL Supple Neck,No JVD, No cervical lymphadenopathy appriciated.  Symmetrical Chest wall movement, Good air movement bilaterally, CTAB RRR,No Gallops,Rubs or new Murmurs, No Parasternal Heave +ve B.Sounds, Abd Soft, Non tender, No organomegaly appriciated, No rebound -guarding or rigidity. No Cyanosis, Clubbing or edema, No new Rash or bruise   PERTINENT RADIOLOGIC STUDIES: CT HEAD WO CONTRAST ( ) Result Date: 12/02/2023 CLINICAL DATA:  78 year old female neurologic deficit. EXAM: CT HEAD WITHOUT CONTRAST TECHNIQUE: Contiguous axial images were obtained from the base of the skull through the vertex without intravenous contrast. RADIATION DOSE REDUCTION: This exam was performed according to the departmental dose-optimization program which includes automated exposure control, adjustment of the mA and/or kV according to patient size and/or use of iterative reconstruction technique. COMPARISON:  CTA head and neck this morning, head CT yesterday and earlier. FINDINGS: Brain: Cerebral volume remains normal for age. Left inferior lentiform perivascular space, normal variant. No midline shift, ventriculomegaly, mass effect, evidence of mass lesion, intracranial hemorrhage or evidence of cortically based acute infarction. Gray-white matter differentiation is within normal limits throughout the brain. Vascular: No suspicious intracranial vascular hyperdensity. Skull: Stable and intact.  No acute osseous abnormality identified. Sinuses/Orbits: Visualized paranasal sinuses and mastoids are stable and well aerated. Other: Stable, negative orbits and scalp soft tissues. IMPRESSION: Stable  and normal for age noncontrast CT appearance of the brain. Electronically Signed   By: VEAR Hurst M.D.   On: 12/02/2023 12:40   CT ANGIO HEAD NECK W WO CM (CODE STROKE) Result Date: 12/02/2023 CLINICAL DATA:  78 year old female with neurologic deficit. EXAM: CT ANGIOGRAPHY HEAD AND NECK TECHNIQUE: Multidetector CT imaging of the head and neck was performed using the standard protocol during bolus administration of intravenous contrast. Multiplanar CT image reconstructions and MIPs were obtained to evaluate the vascular anatomy. Carotid stenosis measurements (when applicable) are obtained utilizing NASCET criteria, using the distal internal carotid diameter as the denominator. RADIATION DOSE REDUCTION: This exam was performed according to the departmental dose-optimization program which includes automated exposure control, adjustment of the mA and/or kV according to patient size and/or use of iterative reconstruction technique. CONTRAST:  75mL OMNIPAQUE  IOHEXOL  350 MG/ML SOLN COMPARISON:  Head CT yesterday.  Prior CTA head and neck 05/19/2022. FINDINGS: CTA  NECK Skeleton: Absent maxillary dentition. Hyperostosis of the calvarium. Mild for age cervical spine degeneration. No acute osseous abnormality identified. Upper chest: Left chest cardiac pacemaker redemonstrated. Mild streak artifact. Negative upper lungs. Other neck: Nonvascular neck soft tissue spaces are within normal limits. Aortic arch: 3 vessel arch.  Stable minimal arch atherosclerosis. Right carotid system: Tortuous brachiocephalic artery and proximal right CCA. Patent right carotid bifurcation. Tortuous right ICA distal to the bulb. No plaque or stenosis. Left carotid system: Mild tortuosity. Stable from the CTA last year with no plaque or stenosis. Vertebral arteries: Calcified plaque of the proximal right subclavian artery without stenosis. Normal right vertebral artery origin. Mildly tortuous right vertebral artery is patent to the skull base, stable  with no plaque or stenosis identified. Proximal left subclavian artery and left vertebral artery origin are normal. Tortuous left V1 segment. Surrounding proximal paravertebral venous contrast reflux similar to the CTA last year. Left vertebral artery appears to be codominant in the neck, patent to the skull base with no plaque or stenosis. However, there is a mildly beaded appearance of the left V2/V3 segment junction on series 5, image 174. This is more apparent than last year. CTA HEAD Posterior circulation: Stable and patent distal vertebral arteries and vertebrobasilar junction with no discrete plaque or stenosis. Mildly tapered appearance of the distal vertebral arteries is unchanged. Both AICA appear to be dominant and patent. Patent basilar artery which is somewhat diminutive, stable, without stenosis. Patent SCA and PCA origins. Both posterior communicating arteries are present, stable. Bilateral PCA branches are stable and within normal limits. Anterior circulation: Both ICA siphons are patent. No left siphon plaque or stenosis. Right ICA siphon appears mildly dominant and tortuous. Mild right siphon calcified plaque without stenosis. Both posterior communicating artery origins are patent. Bilateral carotid termini, MCA and ACA origins appears stable and normal. Normal anterior communicating artery. Right ACA A1 segment is mildly dominant. Bilateral ACA branches are stable and within normal limits. Left MCA M1 segment and trifurcation are patent without stenosis. Right MCA M1 segment and bifurcation are patent without stenosis. Bilateral MCA branches are stable and within normal limits. Venous sinuses: Early contrast timing, not evaluated. Anatomic variants: Mildly diminutive vertebrobasilar system, mildly dominant left vertebral V4 segment, mildly dominant right ACA A1. Review of the MIP images confirms the above findings IMPRESSION: 1. Negative for large vessel occlusion. Stable CTA since last year with  very mild for age atherosclerosis in the head and neck with no arterial stenosis. 2. However, possible Fibromuscular Dysplasia (FMD) of the vertebral arteries at the skull base, more so the left. 3. Minimal aortic arch atherosclerosis.  Left chest pacemaker. Electronically Signed   By: VEAR Hurst M.D.   On: 12/02/2023 05:31   CT HEAD WO CONTRAST Result Date: 12/01/2023 CLINICAL DATA:  Neurological deficit EXAM: CT HEAD WITHOUT CONTRAST TECHNIQUE: Contiguous axial images were obtained from the base of the skull through the vertex without intravenous contrast. RADIATION DOSE REDUCTION: This exam was performed according to the departmental dose-optimization program which includes automated exposure control, adjustment of the mA and/or kV according to patient size and/or use of iterative reconstruction technique. COMPARISON:  12/03/2022 FINDINGS: Brain: No evidence of acute infarction, hemorrhage, hydrocephalus, extra-axial collection or mass lesion/mass effect. Vascular: No hyperdense vessel or unexpected calcification. Skull: Normal. Negative for fracture or focal lesion. Sinuses/Orbits: No acute finding. Other: None. IMPRESSION: No acute intracranial abnormality noted. Electronically Signed   By: Oneil Devonshire M.D.   On: 12/01/2023 19:09  PERTINENT LAB RESULTS: CBC: Recent Labs    12/01/23 1747 12/01/23 1753  WBC  --  7.1  HGB 14.3 14.3  HCT 42.0 43.0  PLT  --  231   CMET CMP     Component Value Date/Time   NA 139 12/01/2023 1753   NA 142 04/25/2022 1411   K 3.8 12/01/2023 1753   CL 105 12/01/2023 1753   CO2 22 12/01/2023 1753   GLUCOSE 120 (H) 12/01/2023 1753   BUN 24 (H) 12/01/2023 1753   BUN 22 04/25/2022 1411   CREATININE 0.90 12/01/2023 1753   CALCIUM  9.8 12/01/2023 1753   PROT 6.9 12/01/2023 1753   PROT 6.8 04/26/2019 0845   ALBUMIN  3.8 12/01/2023 1753   ALBUMIN  4.2 04/26/2019 0845   AST 20 12/01/2023 1753   ALT 14 12/01/2023 1753   ALKPHOS 67 12/01/2023 1753   BILITOT 0.5  12/01/2023 1753   BILITOT 0.3 04/26/2019 0845   GFR 64.40 10/08/2014 1613   EGFR 72 04/25/2022 1411   GFRNONAA >60 12/01/2023 1753    GFR Estimated Creatinine Clearance: 51.9 mL/min (by C-G formula based on SCr of 0.9 mg/dL). No results for input(s): LIPASE, AMYLASE in the last 72 hours. No results for input(s): CKTOTAL, CKMB, CKMBINDEX, TROPONINI in the last 72 hours. Invalid input(s): POCBNP No results for input(s): DDIMER in the last 72 hours. Recent Labs    12/01/23 2248  HGBA1C 5.3   Recent Labs    12/02/23 0348  CHOL 135  HDL 42  LDLCALC 77  TRIG 78  CHOLHDL 3.2   No results for input(s): TSH, T4TOTAL, T3FREE, THYROIDAB in the last 72 hours.  Invalid input(s): FREET3 No results for input(s): VITAMINB12, FOLATE, FERRITIN, TIBC, IRON, RETICCTPCT in the last 72 hours. Coags: Recent Labs    12/01/23 1753  INR 1.4*   Microbiology: No results found for this or any previous visit (from the past 240 hours).  FURTHER DISCHARGE INSTRUCTIONS:  Get Medicines reviewed and adjusted: Please take all your medications with you for your next visit with your Primary MD  Laboratory/radiological data: Please request your Primary MD to go over all hospital tests and procedure/radiological results at the follow up, please ask your Primary MD to get all Hospital records sent to his/her office.  In some cases, they will be blood work, cultures and biopsy results pending at the time of your discharge. Please request that your primary care M.D. goes through all the records of your hospital data and follows up on these results.  Also Note the following: If you experience worsening of your admission symptoms, develop shortness of breath, life threatening emergency, suicidal or homicidal thoughts you must seek medical attention immediately by calling 911 or calling your MD immediately  if symptoms less severe.  You must read complete  instructions/literature along with all the possible adverse reactions/side effects for all the Medicines you take and that have been prescribed to you. Take any new Medicines after you have completely understood and accpet all the possible adverse reactions/side effects.   Do not drive when taking Pain medications or sleeping medications (Benzodaizepines)  Do not take more than prescribed Pain, Sleep and Anxiety Medications. It is not advisable to combine anxiety,sleep and pain medications without talking with your primary care practitioner  Special Instructions: If you have smoked or chewed Tobacco  in the last 2 yrs please stop smoking, stop any regular Alcohol  and or any Recreational drug use.  Wear Seat belts while driving.  Please  note: You were cared for by a hospitalist during your hospital stay. Once you are discharged, your primary care physician will handle any further medical issues. Please note that NO REFILLS for any discharge medications will be authorized once you are discharged, as it is imperative that you return to your primary care physician (or establish a relationship with a primary care physician if you do not have one) for your post hospital discharge needs so that they can reassess your need for medications and monitor your lab values.  Total Time spent coordinating discharge including counseling, education and face to face time equals greater than 30 minutes.  Signed: Marland Reine 12/02/2023 1:12 PM

## 2023-12-02 NOTE — Care Management Obs Status (Signed)
 MEDICARE OBSERVATION STATUS NOTIFICATION   Patient Details  Name: Paula Booker MRN: [id number] Date of Birth: March 15, 1946   Medicare Observation Status Notification Given:  Yes    Jon Cruel 12/02/2023, 11:54 AM

## 2023-12-02 NOTE — Evaluation (Signed)
 Physical Therapy Evaluation Patient Details Name: Paula Booker MRN: [id number] DOB: 1945-12-29 Today's Date: 12/02/2023  History of Present Illness  78 y.o. female [facility] with transient visual field impairment and difficulty with word finding. Symptoms consistent with TIA vs. CVA. PMH: AICD, Anemia, anxiety, arthritis, asthma, casal cell cardinoma, bursitis, CHF, depression, dysrhythmia, HTN, ICD, migraines, NICM, OSA, squamous cell cardinoma of skin, sudden arrhythmia death syndrome, WPW   Clinical Impression  Pt in bed upon arrival and agreeable to PT eval. PTA, pt was independent for mobility with no AD and walking 4 miles a day. Pt reports being at functional mobility baseline with ability to ambulate 472ft independently with no AD. Reports no further changes in vision or with cognition. Pt has intermittent help available upon returning home. Pt feels comfortable discharging home whenever medically stable with no further questions/concerns. No further acute or post-acute PT needs with acute PT signing off.         If plan is discharge home, recommend the following: Assist for transportation   Can travel by private vehicle    Yes    Equipment Recommendations None recommended by PT     Functional Status Assessment Patient has not had a recent decline in their functional status     Precautions / Restrictions Precautions Precautions: None Restrictions Weight Bearing Restrictions Per Provider Order: No      Mobility  Bed Mobility Overal bed mobility: Independent     Transfers Overall transfer level: Independent Equipment used: None   Ambulation/Gait Ambulation/Gait assistance: Independent Gait Distance (Feet): 400 Feet Assistive device: None Gait Pattern/deviations: WFL(Within Functional Limits)     Modified Rankin (Stroke Patients Only) Modified Rankin (Stroke Patients Only) Pre-Morbid Rankin Score: No symptoms Modified Rankin: No symptoms      Balance Overall balance assessment: No apparent balance deficits (not formally assessed)       Pertinent Vitals/Pain Pain Assessment Pain Assessment: No/denies pain    Home Living Family/patient expects to be discharged to:: Private residence Living Arrangements: Alone Available Help at Discharge: Family;Friend(s);Available PRN/intermittently Type of Home: House Home Access: Stairs to enter Entrance Stairs-Rails: Right;Left;Can reach both Entrance Stairs-Number of Steps: 5   Home Layout: Two level;Full bath on main level;Able to live on main level with bedroom/bathroom Home Equipment: Shower seat;Grab bars - tub/shower;Grab bars - toilet;Hand held shower head      Prior Function Prior Level of Function : Independent/Modified Independent;Driving      Extremity/Trunk Assessment   Upper Extremity Assessment Upper Extremity Assessment: Overall WFL for tasks assessed    Lower Extremity Assessment Lower Extremity Assessment: Overall WFL for tasks assessed    Cervical / Trunk Assessment Cervical / Trunk Assessment: Normal  Communication   Communication Communication: No apparent difficulties    Cognition Arousal: Alert Behavior During Therapy: WFL for tasks assessed/performed   PT - Cognitive impairments: No apparent impairments    Following commands: Intact       Cueing Cueing Techniques: Verbal cues     General Comments General comments (skin integrity, edema, etc.): VSS on RA     PT Assessment Patient does not need any further PT services         PT Goals (Current goals can be found in the Care Plan section)  Acute Rehab PT Goals PT Goal Formulation: All assessment and education complete, DC therapy     AM-PAC PT 6 Clicks Mobility  Outcome Measure Help needed turning from your back to your side while in a flat  bed without using bedrails?: None Help needed moving from lying on your back to sitting on the side of a flat bed without using bedrails?:  None Help needed moving to and from a bed to a chair (including a wheelchair)?: None Help needed standing up from a chair using your arms (e.g., wheelchair or bedside chair)?: None Help needed to walk in hospital room?: None Help needed climbing 3-5 steps with a railing? : None 6 Click Score: 24    End of Session   Activity Tolerance: Patient tolerated treatment well Patient left: in chair;with call bell/phone within reach Nurse Communication: Mobility status PT Visit Diagnosis: Other abnormalities of gait and mobility (R26.89)    Time: 9190-9169 PT Time Calculation (min) (ACUTE ONLY): 21 min   Charges:   PT Evaluation $PT Eval Low Complexity: 1 Low   PT General Charges $$ ACUTE PT VISIT: 1 Visit    Kate ORN, PT, DPT Secure Chat Preferred  Rehab Office [phone]   Kate BRAVO Paula Booker 12/02/2023, 8:37 AM

## 2023-12-02 NOTE — Progress Notes (Signed)
 NEUROLOGY CONSULT FOLLOW UP NOTE   Date of service: December 02, 2023 Patient Name: Paula Booker MRN:  [id number] DOB:  1945-07-24  Interval Hx/subjective   No family at the bedside.  Patient sitting in the chair in no apparent distress.  She just got finished working with therapy.  She states that she is back to her normal self.  She states that yesterday she developed a very sharp pain across her forehead and developed blurry vision and trouble getting her words out, at which point she went and laid down and took a nap and when she awoke from her nap her symptoms were gone.   She does endorse history of migraines but has not had one in a very long time   Vitals   Vitals:   12/01/23 2229 12/02/23 0000 12/02/23 0744 12/02/23 0745  BP:  122/65  111/62  Pulse: (!) 54 66 76 76  Resp: 15 19    Temp: 97.6 F (36.4 C) 97.7 F (36.5 C)  97.7 F (36.5 C)  TempSrc: Oral Oral Oral Oral  SpO2: 97% 96%    Weight:      Height:         Body mass index is 30.05 kg/m.  Physical Exam   Constitutional: Appears well-developed and well-nourished.   Psych: Affect appropriate to situation.   Eyes: No scleral injection.   HENT: No OP obstrucion.   Head: Normocephalic.   Cardiovascular: Normal rate and regular rhythm.   Respiratory: Effort normal, non-labored breathing.   GI: Soft.  No distension. There is no tenderness.   Skin: WDI.    Neurologic Examination   Mental Status -  Level of arousal and orientation to time, place, and person were intact. Language including expression, naming, repetition, comprehension was assessed and found intact. Attention span and concentration were normal. Recent and remote memory were intact. Fund of Knowledge was assessed and was intact.  Cranial Nerves II - XII - II - Visual field intact OU. III, IV, VI - Extraocular movements intact. V - Facial sensation intact bilaterally. VII - Facial movement intact bilaterally. VIII - Hearing & vestibular  intact bilaterally. X - Palate elevates symmetrically. XI - Chin turning & shoulder shrug intact bilaterally. XII - Tongue protrusion intact.  Motor Strength - The patient's strength was normal in all extremities and pronator drift was absent.  Bulk was normal and fasciculations were absent.   Motor Tone - Muscle tone was assessed at the neck and appendages and was normal. Sensory - Light touch, temperature/pinprick were assessed and were symmetrical.   Coordination - The patient had normal movements in the hands and feet with no ataxia or dysmetria.  Tremor was absent. Gait and Station - deferred.  Medications  Current Facility-Administered Medications:    0.9 %  sodium chloride  infusion, , Intravenous, Continuous, Acheampong, Maude POUR, MD, Last Rate: 40 mL/hr at 12/01/23 2243, New Bag at 12/01/23 2243   acetaminophen  (TYLENOL ) tablet 650 mg, 650 mg, Oral, Q4H PRN **OR** acetaminophen  (TYLENOL ) 160 MG/5ML solution 650 mg, 650 mg, Per Tube, Q4H PRN **OR** acetaminophen  (TYLENOL ) suppository 650 mg, 650 mg, Rectal, Q4H PRN, Acheampong, Maude POUR, MD   albuterol  (PROVENTIL ) (2.5 MG/3ML) 0.083% nebulizer solution 3 mL, 3 mL, Inhalation, Q6H PRN, Acheampong, Maude POUR, MD   apixaban  (ELIQUIS ) tablet 5 mg, 5 mg, Oral, BID, Acheampong, Peter K, MD, 5 mg at 12/02/23 0932   aspirin  EC tablet 81 mg, 81 mg, Oral, Daily, Acheampong, Peter K, MD, 81 mg  at 12/02/23 0932   colestipol  (COLESTID ) tablet 2 g, 2 g, Oral, Daily, Acheampong, Maude POUR, MD, 2 g at 12/02/23 9066   cyanocobalamin  (VITAMIN B12) tablet 5,000 mcg, 5,000 mcg, Oral, q AM, Acheampong, Peter K, MD, 5,000 mcg at 12/02/23 9377   escitalopram  (LEXAPRO ) tablet 20 mg, 20 mg, Oral, q AM, Acheampong, Peter K, MD, 20 mg at 12/02/23 9377   fluticasone  furoate-vilanterol (BREO ELLIPTA ) 200-25 MCG/ACT 1 puff, 1 puff, Inhalation, Daily, Acheampong, Maude POUR, MD, 1 puff at 12/02/23 0820   furosemide  (LASIX ) tablet 20 mg, 20 mg, Oral, Daily, Acheampong, Peter  K, MD, 20 mg at 12/02/23 0932   pantoprazole  (PROTONIX ) EC tablet 40 mg, 40 mg, Oral, BID, Acheampong, Peter K, MD, 40 mg at 12/02/23 0932   rosuvastatin  (CRESTOR ) tablet 40 mg, 40 mg, Oral, QHS, Acheampong, Peter K, MD   sacubitril -valsartan  (ENTRESTO ) 24-26 mg per tablet, 1 tablet, Oral, BID, Acheampong, Maude POUR, MD, 1 tablet at 12/02/23 0932   senna-docusate (Senokot-S) tablet 1 tablet, 1 tablet, Oral, QHS PRN, Marca Maude POUR, MD  Labs and Diagnostic Imaging   CBC:  Recent Labs  Lab 12/01/23 1747 12/01/23 1753  WBC  --  7.1  NEUTROABS  --  3.7  HGB 14.3 14.3  HCT 42.0 43.0  MCV  --  93.3  PLT  --  231    Basic Metabolic Panel:  Lab Results  Component Value Date   NA 139 12/01/2023   K 3.8 12/01/2023   CO2 22 12/01/2023   GLUCOSE 120 (H) 12/01/2023   BUN 24 (H) 12/01/2023   CREATININE 0.90 12/01/2023   CALCIUM  9.8 12/01/2023   GFRNONAA >60 12/01/2023   GFRAA >60 12/31/2019   Lipid Panel:  Lab Results  Component Value Date   LDLCALC 77 12/02/2023   HgbA1c:  Lab Results  Component Value Date   HGBA1C 5.3 12/01/2023   Urine Drug Screen:     Component Value Date/Time   LABOPIA NONE DETECTED 05/19/2022 1722   COCAINSCRNUR NONE DETECTED 05/19/2022 1722   LABBENZ NONE DETECTED 05/19/2022 1722   AMPHETMU NONE DETECTED 05/19/2022 1722   THCU NONE DETECTED 05/19/2022 1722   LABBARB NONE DETECTED 05/19/2022 1722    Alcohol Level     Component Value Date/Time   ETH <15 12/01/2023 1745   INR  Lab Results  Component Value Date   INR 1.4 (H) 12/01/2023   APTT  Lab Results  Component Value Date   APTT 34 12/01/2023   AED levels: No results found for: PHENYTOIN, ZONISAMIDE, LAMOTRIGINE, LEVETIRACETA  CT Head without contrast(Personally reviewed): No acute process  Repeat CT head  With no acute process   CT angio Head and Neck with contrast(Personally reviewed): No LVO  Labs LDL 77 A1c 5.3  Echo 10/09/2023 EF 40-45%.  LA mildly  dilated  Assessment   Paula Booker is a 78 y.o. female hx of trigeminal neuralgia, migraines, afib (on Eliquis ), WPW, CHF (s/p AICD), HTN, OSA, anxiety, depression who presents to the ED with transient episode of blurry vision, expressive aphasia, and headache. History of similar episodes as well as confirmation of what appears to be a scintillating scotoma on exam is consistent with a migraine.    This is likely a complicated migraine and less likely stroke or TIA  Recommendations  - Unable to obtain MRI due to AICD, so will repeat CT head this morning to evaluate for acute process -Continue home Eliquis  and statin - If repeat CT head is negative  for acute process patient can be discharged home from neurology standpoint - Neurology will sign off.  Please call with questions or concerns  ____________________________________________________________________   Signed, Karna DELENA Geralds, NP [facility]

## 2023-12-02 NOTE — Discharge Instructions (Signed)

## 2023-12-02 NOTE — Progress Notes (Signed)
 Primary RN completed and printed medlist.  AVS reviewed with patient.  Pt expressed verbal understanding of discharge plan of care.  Denies pain or discomfort.   PIV removed dressing intact.   NT transferred to Emergency room Exit B.

## 2023-12-04 ENCOUNTER — Ambulatory Visit: Attending: Cardiology | Admitting: Cardiology

## 2023-12-04 ENCOUNTER — Encounter: Payer: Self-pay | Admitting: Cardiology

## 2023-12-04 VITALS — BP 111/70 | HR 77 | Resp 16 | Ht 63.0 in | Wt 178.1 lb

## 2023-12-04 DIAGNOSIS — I480 Paroxysmal atrial fibrillation: Secondary | ICD-10-CM | POA: Diagnosis not present

## 2023-12-04 DIAGNOSIS — Z95810 Presence of automatic (implantable) cardiac defibrillator: Secondary | ICD-10-CM | POA: Diagnosis not present

## 2023-12-04 DIAGNOSIS — I5032 Chronic diastolic (congestive) heart failure: Secondary | ICD-10-CM

## 2023-12-04 DIAGNOSIS — I428 Other cardiomyopathies: Secondary | ICD-10-CM | POA: Diagnosis not present

## 2023-12-04 DIAGNOSIS — I10 Essential (primary) hypertension: Secondary | ICD-10-CM

## 2023-12-04 DIAGNOSIS — G4730 Sleep apnea, unspecified: Secondary | ICD-10-CM | POA: Diagnosis not present

## 2023-12-04 DIAGNOSIS — Z8673 Personal history of transient ischemic attack (TIA), and cerebral infarction without residual deficits: Secondary | ICD-10-CM

## 2023-12-04 NOTE — Patient Instructions (Signed)
 Medication Instructions:  No medication changes were made at this visit. Continue current regimen.   *If you need a refill on your cardiac medications before your next appointment, please call your pharmacy*  Lab Work: None ordered today. If you have labs (blood work) drawn today and your tests are completely normal, you will receive your results only by: MyChart Message (if you have MyChart) OR A paper copy in the mail If you have any lab test that is abnormal or we need to change your treatment, we will call you to review the results.  Testing/Procedures: None ordered today.  Follow-Up: At [facility], you and your health needs are our priority.  As part of our continuing mission to provide you with exceptional heart care, our providers are all part of one team.  This team includes your primary Cardiologist (physician) and Advanced Practice Providers or APPs (Physician Assistants and Nurse Practitioners) who all work together to provide you with the care you need, when you need it.  Your next appointment:   1 year(s)  Provider:   Madonna Large, DO

## 2023-12-04 NOTE — Progress Notes (Signed)
 Cardiology Office Note:  .   Date:  12/04/2023  ID:  Paula Booker, DOB 05-25-1945, MRN [id number] PCP:  Frederik Charleston, MD  Former Cardiology Providers: Dr. Candyce Reek [facility] Providers Cardiologist:  Madonna Large, DO, [facility] (established care 12/04/23)  Electrophysiologist:  Danelle Birmingham, MD  Electrophysiologist:  Danelle Birmingham, MD  Click to update primary MD,subspecialty MD or APP then REFRESH:1}    Chief Complaint  Patient presents with   Hospitalization Follow-up   Follow-up    Heart failure, cardiac apathy, atrial fibrillation    History of Present Illness: .   Paula Booker is a 78 y.o. Caucasian female whose past medical history and cardiovascular risk factors includes: History of non ischemic cardiomyopathy, chronic heart failure with improved EF/HFmrEF, history of syncope, WPW pattern, paroxysmal atrial fibrillation, hypertension, Hx of stroke (05/2022), ? TIA 11/2023.  Formally under the care of Dr. Candyce Reek who last saw Paula Booker back in Aug 19, 2021. I am seeing her for the first time to re-establishing care.   In 2021 patient was noted to have an LVEF of 25-30% and was started on GDMT.  Most recent echocardiogram from June 2025 illustrates an LVEF of 40-45%, grade 1 diastolic dysfunction, right ventricular size and function normal, mild LAE, mild MR, estimated RAP 3 mmHg.  Patient has history of WPW pattern and underwent EP study with reported to have no inducible arrhythmia.  She did have a dual-chamber ICD implant but underwent upgrade to BiV ICD in February 2024.  She has known history of paroxysmal atrial fibrillation and is currently on multiple agents as well as anticoagulation for thromboembolic prophylaxis.  Patient was hospitalized earlier this month August 2025 which was thought to be either secondary to complicated migraine versus TIA.  Patient is accompanied by her close friend Marykay and she provides verbal consent to having  her present during today's encounter.  On 12/01/2023 patient was talking to her friend over the phone and patient was noted to have sudden onset of slurred speech and expressive aphasia.  She went to the ED for further evaluation and management.  Based on discharge summary from 12/02/2023 patient may have had a complicated migraine versus TIA.  Patient states that she has a follow-up appointment with neurology in the coming week and the plan was to undergo brain MRI.  However patient needs clearance for the MRI given the fact that she has a BiV ICD.  Otherwise she denies anginal chest pain, heart failure symptoms. Overall functional capacity remains relatively stable.   Review of Systems: .   Review of Systems  Constitutional: Positive for malaise/fatigue.  Cardiovascular:  Negative for chest pain, claudication, irregular heartbeat, leg swelling, near-syncope, orthopnea, palpitations, paroxysmal nocturnal dyspnea and syncope.  Respiratory:  Negative for shortness of breath.   Hematologic/Lymphatic: Negative for bleeding problem.    Studies Reviewed:   EKG: EKG Interpretation Date/Time:  Monday December 04 2023 16:27:49 EDT Ventricular Rate:  77 PR Interval:  126 QRS Duration:  118 QT Interval:  430 QTC Calculation: 486 R Axis:   -52  Text Interpretation: Atrial-sensed ventricular-paced rhythm Left axis deviation Low voltage QRS Inferior infarct , age undetermined Cannot rule out Anterior infarct , age undetermined When compared with ECG of 01-Dec-2023 17:58, No significant change since last tracing Confirmed by Large Madonna [phone]) on 12/04/2023 4:43:21 PM  Echocardiogram: October 09, 2023  1. Left ventricular ejection fraction, by estimation, is 40 to 45%. The  left ventricle has mildly decreased  function. The left ventricle has no  regional wall motion abnormalities. Left ventricular diastolic parameters  are consistent with Grade I  diastolic dysfunction (impaired relaxation).   2. Right  ventricular systolic function is normal. The right ventricular  size is normal.   3. Left atrial size was mildly dilated.   4. The mitral valve is normal in structure. Mild mitral valve  regurgitation. No evidence of mitral stenosis.   5. The aortic valve is normal in structure. Aortic valve regurgitation is  not visualized. No aortic stenosis is present.   6. The inferior vena cava is normal in size with greater than 50%  respiratory variability, suggesting right atrial pressure of 3 mmHg.   Coronary CTA March 2022 1. Coronary calcium  score of 0. This was 1st percentile for age, sex, and race matched control.   2. Normal coronary origin.  Left dominance.   3. CAD-RADS 0. No evidence of CAD (0%). Consider non-atherosclerotic causes of chest pain.   4. Dual chamber cardiac device and leads visualized.  RADIOLOGY: N/A  Risk Assessment/Calculations:   Click Here to Calculate/Change CHADS2VASc Score The patient's CHADS2-VASc score is 8, indicating a 10.8% annual risk of stroke. CHF History: Yes HTN History: Yes Diabetes History: No Stroke History: Yes Vascular Disease History: Yes    Labs:       Latest Ref Rng & Units 12/01/2023    5:53 PM 12/01/2023    5:47 PM 10/08/2023    4:55 AM  CBC  WBC 4.0 - 10.5 K/uL 7.1   8.5   Hemoglobin 12.0 - 15.0 g/dL 85.6  85.6  87.0   Hematocrit 36.0 - 46.0 % 43.0  42.0  38.9   Platelets 150 - 400 K/uL 231   213        Latest Ref Rng & Units 12/01/2023    5:53 PM 12/01/2023    5:47 PM 10/09/2023    9:18 AM  BMP  Glucose 70 - 99 mg/dL 879  877  889   BUN 8 - 23 mg/dL 24  27  11    Creatinine 0.44 - 1.00 mg/dL 9.09  9.09  9.64   Sodium 135 - 145 mmol/L 139  140  140   Potassium 3.5 - 5.1 mmol/L 3.8  3.9  3.7   Chloride 98 - 111 mmol/L 105  104  110   CO2 22 - 32 mmol/L 22   24   Calcium  8.9 - 10.3 mg/dL 9.8   9.1       Latest Ref Rng & Units 12/01/2023    5:53 PM 12/01/2023    5:47 PM 10/09/2023    9:18 AM  CMP  Glucose 70 - 99  mg/dL 879  877  889   BUN 8 - 23 mg/dL 24  27  11    Creatinine 0.44 - 1.00 mg/dL 9.09  9.09  9.64   Sodium 135 - 145 mmol/L 139  140  140   Potassium 3.5 - 5.1 mmol/L 3.8  3.9  3.7   Chloride 98 - 111 mmol/L 105  104  110   CO2 22 - 32 mmol/L 22   24   Calcium  8.9 - 10.3 mg/dL 9.8   9.1   Total Protein 6.5 - 8.1 g/dL 6.9     Total Bilirubin 0.0 - 1.2 mg/dL 0.5     Alkaline Phos 38 - 126 U/L 67     AST 15 - 41 U/L 20     ALT 0 - 44 U/L  14       Lab Results  Component Value Date   CHOL 135 12/02/2023   HDL 42 12/02/2023   LDLCALC 77 12/02/2023   TRIG 78 12/02/2023   CHOLHDL 3.2 12/02/2023   No results for input(s): LIPOA in the last 8760 hours. No components found for: NTPROBNP No results for input(s): PROBNP in the last 8760 hours. No results for input(s): TSH in the last 8760 hours.  Physical Exam:    Today's Vitals   12/04/23 1624  BP: 111/70  Pulse: 77  Resp: 16  SpO2: 94%  Weight: 178 lb 1.6 oz (80.8 kg)  Height: 5' 3 (1.6 m)   Body mass index is 31.55 kg/m. Wt Readings from Last 3 Encounters:  12/04/23 178 lb 1.6 oz (80.8 kg)  12/01/23 171 lb (77.6 kg)  10/07/23 171 lb (77.6 kg)    Physical Exam  Constitutional: No distress.  hemodynamically stable  Neck: No JVD present.  Cardiovascular: Normal rate, regular rhythm, S1 normal and S2 normal. Exam reveals no gallop, no S3 and no S4.  No murmur heard. Pulmonary/Chest: Effort normal and breath sounds normal. No stridor. She has no wheezes. She has no rales.  Musculoskeletal:        General: No edema.     Cervical back: Neck supple.  Skin: Skin is warm.     Impression & Recommendation(s):  Impression:   ICD-10-CM   1. Heart failure with improved ejection fraction (HFimpEF) (HCC)  I50.32 EKG 12-Lead    2. NICM (nonischemic cardiomyopathy) (HCC)  I42.8     3. ICD (implantable cardioverter-defibrillator), dual, in situ  Z95.810     4. Paroxysmal atrial fibrillation (HCC)  I48.0     5.  Essential hypertension  I10     6. History of TIA (transient ischemic attack) and stroke  Z86.73     7. Sleep apnea in adult  G47.30        Recommendation(s):  Heart failure with improved ejection fraction (HFimpEF) (HCC) NICM (nonischemic cardiomyopathy) (HCC) Biventricular ICD (implantable cardioverter-defibrillator), dual, in situ NYHA class II 2021: LVEF 25-30% 09/2023: LVEF 40-45% Continue Entresto  24/26 mg p.o. twice daily. Continue spironolactone  25 mg p.o. daily. Continue Lasix  40 mg on as needed basis. Continue carvedilol  6.25 mg p.o. twice daily Status post BiV ICD upgrade Echocardiogram from June 2025 reviewed  Paroxysmal atrial fibrillation (HCC) Rate controlled on carvedilol . Rhythm control: N/A. Thromboembolic prophylaxis: Eliquis  Patient denies any prior history of direct-current cardioversion or antiarrhythmic medications. Risks, benefits, and alternatives to anticoagulation discussed. Patient is advised to have her hemoglobin and renal function checked every 6 months, patient plans to have it done with PCP  Essential hypertension Office blood pressures are very well-controlled. Medications as discussed above Reemphasized importance of low-salt diet.  History of TIA (transient ischemic attack) and stroke Patient has had a history of stroke and?  TIA. Patient plans to undergo brain MRI but needs clearance prior to proceeding forward. Patient initially had a dual-chamber pacemaker ICD which was upgraded to BiV ICD.  Will need to contact device clinic to make sure that the initial leads are MRI compatible.  The most recent lead placement is MRI conditional.  Staff message will be sent to device clinic for further clarification  Sleep apnea in adult Endorses compliance with CPAP  Total time spent: 48 minutes Reviewing patient's medical records including prior cardiology note, discharge summary 12/02/2023. Reviewed echocardiogram June 2025. Reviewed coronary CTA  results from March 2022. Independently reviewed EKG 12/04/2023  Reviewed prior device implant procedure notes, when available Prescription drug management Risks, benefits, alternatives for anticoagulation discussed Independently examination of the patient and counseling her regarding her cardiovascular comorbidities. Reaching out to EP to see if the device is MRI compatible  Orders Placed:  Orders Placed This Encounter  Procedures   EKG 12-Lead     Final Medication List:   No orders of the defined types were placed in this encounter.   There are no discontinued medications.   Current Outpatient Medications:    acetaminophen  (TYLENOL ) 500 MG tablet, Take 500 mg by mouth in the morning and at bedtime., Disp: , Rfl:    albuterol  (VENTOLIN  HFA) 108 (90 Base) MCG/ACT inhaler, INHALE 2 PUFFS BY MOUTH EVERY 6 HOURS AS NEEDED FOR WHEEZING OR SHORTNESS OF BREATH, Disp: 9 g, Rfl: 0   apixaban  (ELIQUIS ) 5 MG TABS tablet, Take 1 tablet by mouth twice daily, Disp: 180 tablet, Rfl: 1   Calcium  Carb-Cholecalciferol  600-20 MG-MCG TABS, Take 1 tablet by mouth in the morning., Disp: , Rfl:    carvedilol  (COREG ) 6.25 MG tablet, Take 1 tablet by mouth twice daily, Disp: 180 tablet, Rfl: 2   cetirizine (ZYRTEC) 10 MG tablet, Take 10 mg by mouth in the morning., Disp: , Rfl:    Cholecalciferol  (VITAMIN D3) 5000 units CAPS, Take 5,000 Units by mouth in the morning., Disp: , Rfl:    colestipol  (COLESTID ) 1 g tablet, Take 2 tablets (2 g total) by mouth daily., Disp: 60 tablet, Rfl: 0   Collagen-Vitamin C  (COLLAGEN PLUS VITAMIN C  PO), Take 1 tablet by mouth in the morning., Disp: , Rfl:    Cyanocobalamin  (VITAMIN B-12) 5000 MCG LOZG, Take 5,000 mcg by mouth in the morning., Disp: , Rfl:    escitalopram  (LEXAPRO ) 20 MG tablet, Take 20 mg by mouth in the morning., Disp: , Rfl:    fluticasone  furoate-vilanterol (BREO ELLIPTA ) 200-25 MCG/ACT AEPB, Inhale 1 puff by mouth once daily, Disp: 60 each, Rfl: 11    furosemide  (LASIX ) 40 MG tablet, TAKE 1 TO 2 TABLETS BY MOUTH AS DIRECTED ONCE DAILY FOR  EDEMA,  SHORTNESS  OF  BREATH  OR  WEIGHT  GAIN., Disp: 180 tablet, Rfl: 2   ipratropium-albuterol  (DUONEB) 0.5-2.5 (3) MG/3ML SOLN, Take 3 mLs by nebulization every 4 (four) hours as needed., Disp: 360 mL, Rfl: 11   MOUNJARO 2.5 MG/0.5ML Pen, Inject 2.5 mg into the skin once a week., Disp: , Rfl:    Multiple Vitamin (MULTIVITAMIN WITH MINERALS) TABS tablet, Take 1 tablet by mouth in the morning., Disp: , Rfl:    pantoprazole  (PROTONIX ) 40 MG tablet, Take 40 mg by mouth 2 (two) times daily., Disp: , Rfl: 0   rosuvastatin  (CRESTOR ) 5 MG tablet, Take 5 mg by mouth at bedtime., Disp: , Rfl:    sacubitril -valsartan  (ENTRESTO ) 24-26 MG, Take 1 tablet by mouth twice daily, Disp: 180 tablet, Rfl: 3   spironolactone  (ALDACTONE ) 25 MG tablet, Take 1 tablet by mouth once daily, Disp: 90 tablet, Rfl: 3   Zinc  100 MG TABS, Take 1 tablet by mouth daily at 6 (six) AM., Disp: , Rfl:   Consent:   NA  Disposition:   1 year follow-up, sooner if needed  Her questions and concerns were addressed to her satisfaction. She voices understanding of the recommendations provided during this encounter.    Signed, Madonna Michele HAS, [facility]  A Division of [facility] [address] [place], [place] [address]  12/04/2023 5:46 PM

## 2023-12-05 ENCOUNTER — Telehealth: Payer: Self-pay

## 2023-12-05 DIAGNOSIS — G459 Transient cerebral ischemic attack, unspecified: Secondary | ICD-10-CM | POA: Diagnosis not present

## 2023-12-05 NOTE — Telephone Encounter (Signed)
 Spoke with pt and relayed the message that her PPM system is MRI compatible. Pt denied any further questions at this time and thanked us  for the call back.

## 2023-12-05 NOTE — Telephone Encounter (Signed)
-----   Message from [facility] sent at 12/05/2023 10:43 AM EDT ----- Regarding: RE: PPM and MRI Please call the patient and update her.   Thanks. ----- Message ----- From: Gordon Ronal SQUIBB, RN Sent: 12/05/2023   8:28 AM EDT To: Madonna Large, DO Subject: FW: PPM and MRI                                FYI ----- Message ----- From: Prentiss Almarie DASEN, RN Sent: 12/04/2023   5:32 PM EDT To: Ronal SQUIBB Gordon, RN Subject: RE: PPM and MRI                                Yes, the system is MRI compatible.   Thanks, Anette, RN ----- Message ----- From: Gordon Ronal SQUIBB, RN Sent: 12/04/2023   5:09 PM EDT To: Lurena Hershal [facility] Subject: PPM and MRI                                    Hey! Dr. Large saw this pt today in clinic and he said tha the pt has a PPM with 3 different leads, placed at 2 separate times. He was trying to see in the chart and make sure they were MRI compatible but asked for me to reach out to see if you guys are able to review this. Thanks!  Keven, RN

## 2023-12-12 ENCOUNTER — Ambulatory Visit (INDEPENDENT_AMBULATORY_CARE_PROVIDER_SITE_OTHER): Admitting: Neurology

## 2023-12-12 ENCOUNTER — Ambulatory Visit: Attending: Internal Medicine

## 2023-12-12 ENCOUNTER — Encounter: Payer: Self-pay | Admitting: Neurology

## 2023-12-12 VITALS — BP 123/71 | HR 77 | Ht 63.5 in | Wt 175.0 lb

## 2023-12-12 DIAGNOSIS — R519 Headache, unspecified: Secondary | ICD-10-CM | POA: Diagnosis not present

## 2023-12-12 DIAGNOSIS — R29818 Other symptoms and signs involving the nervous system: Secondary | ICD-10-CM | POA: Diagnosis not present

## 2023-12-12 DIAGNOSIS — I5032 Chronic diastolic (congestive) heart failure: Secondary | ICD-10-CM

## 2023-12-12 DIAGNOSIS — Z95810 Presence of automatic (implantable) cardiac defibrillator: Secondary | ICD-10-CM | POA: Diagnosis not present

## 2023-12-12 DIAGNOSIS — R4701 Aphasia: Secondary | ICD-10-CM | POA: Diagnosis not present

## 2023-12-12 NOTE — Patient Instructions (Addendum)
 MRI brain and MRA head  Carotid dopplers  Will call with results and figure out next steps or schedule a follow up  Transient Ischemic Attack A transient ischemic attack (TIA) happens when blood supply to the brain is blocked temporarily. A TIA causes stroke-like symptoms that go away quickly without causing any permanent damage. Having a TIA can be considered a warning sign for a stroke and should not be ignored. A person who has a TIA is at higher risk for a stroke. What are the causes? This condition is caused by a temporary blockage in an artery in the head or neck. This means the brain does not get the blood supply it needs. A blockage can be caused by: Fatty buildup in an artery in the head or neck (atherosclerosis). A blood clot traveling from the heart. An artery tear (dissection). Inflammation of an artery (vasculitis). Sometimes the cause is not known. What increases the risk? Certain factors make you more likely to develop this condition. Some of these are things you can change, including: Using products that contain nicotine or tobacco. Being inactive. Heavy alcohol use. Drug use, especially cocaine and methamphetamine. Medical conditions that may increase your risk include: High blood pressure (hypertension). High cholesterol. Diabetes. Heart disease (coronary artery disease). An irregular heartbeat, also called atrial fibrillation (AFib). Sickle cell disease. Blood clotting disorders (hypercoagulable state). Other risk factors include: Being over the age of 35. Being female. Obesity. Sleep problems such as sleep apnea. Family history of stroke. Previous history of blood clots, stroke, TIA, or heart attack. What are the signs or symptoms? Symptoms of a TIA are the same as those of a stroke. The symptoms develop suddenly, and then go away quickly. They may include: Dizziness, loss of balance and coordination, or trouble walking. Vision changes, such as double vision,  blurred vision, or loss of vision. Weakness or numbness in your face, arm, or leg, especially on one side of your body. Trouble speaking, understanding speech, or both (aphasia). Nausea and vomiting. Severe headache. Confusion. If possible, note what time your symptoms started. Tell your health care provider. How is this diagnosed? This condition may be diagnosed based on: Your symptoms and medical history. A physical exam. Imaging tests, usually a CT scan or MRI of the brain. Blood tests. You may also have other tests, including: Electrocardiogram (ECG). Echocardiogram. Continuous heart monitoring. Carotid ultrasound. A scan of blood circulation in the brain (CT angiogram or MR angiogram). How is this treated? The goal of treatment is to reduce the risk for a stroke. Stroke prevention therapies may include: Changes to diet and lifestyle, such as being physically active and stopping smoking. Treating other health conditions, such as diabetes or AFib. Medicines to thin the blood (antiplatelets or anticoagulants). Blood pressure medicines. Medicines to reduce cholesterol. If testing shows a narrowing in the arteries to your brain, your health care provider may recommend a procedure, such as: Carotid endarterectomy. This is done to remove the blockage from your artery. Carotid angioplasty and stenting. This uses a small mesh tube (stent) to open or widen an artery in the neck. The stent helps keep the artery open by supporting the artery walls. Follow these instructions at home: Medicines Take over-the-counter and prescription medicines only as told by your health care provider. If you were told to take a medicine to thin your blood, such as aspirin  or an anticoagulant, use it exactly as told by your health care provider. Taking too much blood-thinning medicine can cause bleeding. Taking  too little will not protect you against a stroke and other problems. Eating and drinking  Eat 5  or more servings of fruits and vegetables each day. Follow guidelines from your health care provider about your diet. You may need to follow a certain diet to help manage risk factors for stroke. This may include: Eating a low-fat, low-salt diet. Choosing high-fiber foods. Limiting carbohydrates and sugar. If you drink alcohol: Limit how much you have to: 0-1 drink a day for women who are not pregnant. 0-2 drinks a day for men. Know how much alcohol is in your drink. In the [place] one drink equals one 12 oz bottle of beer (355 mL), one 5 oz glass of wine (148 mL), or one 1 oz glass of hard liquor (44 mL). General instructions Maintain a healthy weight. Try to get at least 30 minutes of exercise on most days. Get treatment if you have sleep apnea. Do not use any products that contain nicotine or tobacco. These products include cigarettes, chewing tobacco, and vaping devices, such as e-cigarettes. If you need help quitting, ask your health care provider. Do not use illegal drugs. Keep all follow-up visits. Your health care provider will want to know if you have any more symptoms and to check blood labs if any medicines were prescribed. Where to find more information American Stroke Association: stroke.org Get help right away if: You have chest pain. You have fast or irregular heartbeats (palpitations). You have any symptoms of a stroke. BE FAST is an easy way to remember the main warning signs of a stroke. B - Balance. Signs are dizziness, sudden trouble walking, or loss of balance. E - Eyes. Signs are trouble seeing or a sudden change in vision. F - Face. Signs are sudden weakness or numbness of the face, or the face or eyelid drooping on one side. A - Arms. Signs are weakness or numbness in an arm. This happens suddenly and usually on one side of the body. S - Speech.Signs are sudden trouble speaking, slurred speech, or trouble understanding what people say. T - Time. Time to call  emergency services. Write down what time symptoms started. You have other signs of a stroke, such as: A sudden, severe headache with no known cause. Nausea or vomiting. Seizure. These symptoms may be an emergency. Get help right away. Call 911. Do not wait to see if the symptoms will go away. Do not drive yourself to the hospital. This information is not intended to replace advice given to you by your health care provider. Make sure you discuss any questions you have with your health care provider. Document Revised: 09/10/2021 Document Reviewed: 09/10/2021 Elsevier Patient Education  2024 [employer].

## 2023-12-12 NOTE — Progress Notes (Unsigned)
 [facility]    Provider:  Dr Ines Requesting Provider: Raenelle Donalda HERO, MD Primary Care Provider:  Frederik Charleston, MD  CC:  GET CELSO     HPI:  Paula Booker is a 78 y.o. female here as requested by Raenelle Donalda HERO, MD for migraine vs TIA. has Syncope; Acute on chronic systolic CHF (congestive heart failure) (HCC); Type B WPW syndrome- RFA Dec 2013; GERD (gastroesophageal reflux disease); Asthma in adult, mild persistent, uncomplicated; Shortness of breath; NICM (nonischemic cardiomyopathy) (HCC); ICD- MDT implanted Dec 2013; Chest pain; Obesity (BMI 30-39.9); Normal coronary arteries; Essential hypertension; Compression fracture; Diarrhea; CAP (community acquired pneumonia); Nausea vomiting and diarrhea; Chronic systolic heart failure (HCC); Complicated migraine; Anxiety; Depression; Pacemaker; Acute medial meniscus tear, left, initial encounter; Chondromalacia of left knee; OSA (obstructive sleep apnea); SVT (supraventricular tachycardia) (HCC); ICD (implantable cardioverter-defibrillator) in place; Acute medial meniscus tear of right knee; Chondromalacia, right knee; Paroxysmal atrial fibrillation (HCC); Acute gout involving toe of right foot; Age-related osteoporosis without current pathological fracture; Basal cell carcinoma; Chronic pain; Collapsed vertebra, not elsewhere classified, site unspecified, subsequent encounter for fracture with routine healing; Congestive heart failure (HCC); Exacerbation of asthma; Hardening of the aorta (main artery of the heart) (HCC); Hypercoagulable state (HCC); Gout; Intestinal malabsorption; Mild intermittent asthma; Mixed hyperlipidemia; Moderate recurrent major depression (HCC); Primary insomnia; Seasonal allergic rhinitis; Severe recurrent major depression without psychotic features (HCC); Unspecified cirrhosis of liver (HCC); Osteoarthritis of knee; Cerebral infarction (HCC); Infection of pacemaker pocket (HCC); Lung nodule;  Hypotension; Dehydration; Orthostatic dizziness; and TIA (transient ischemic attack) on their problem list.  I reviewed Dr. Pixie notes she presented August 22 of this year to the emergency room with sudden onset of difficulty speaking and visual difficulty.  She also developed a headache.  She was trying to read and she was unable to do that.  She also had difficulty forming words.  She ended up taking a nap and when she woke up her symptoms had mostly resolved.  Her doctor told her to go to the ED.  She is on Eliquis .  She was seen by neurology Dr.Bui same day, she reports she was talking to her friend and she felt a streak travel across her forehead, she tried to say something but nereida was coming out, she also had difficulty reading was on the TV, no blurry vision but just simply could not read the text, she then had blurry vision and a headache.  She has a history of migraines since October 2023 and had microvascular decompression for her trigeminal neuralgia.  She does have an aura and that she has scintillating scotoma during her episodes.  She had similar episodes in February and August 2024 all 3 times the episode started with the sensation of a streak traveling across her forehead and had blurry vision, in February 2024 she also reported dizziness and on exam during code stroke she was found to have left superior quadrantanopia, upper extremity weakness, left leg drift, and at that time she had been off of Eliquis  for a few days due to AICD implantation.  It was thought she had a TIA or small stroke.  MRI was not able to be obtained at that time.  In August 2024 she also presented as a code stroke with headache, blurry vision, left-sided weakness.  Her symptoms resolved after a nap and she was discharged with a mild headache.  Was thought she perhaps either had a complex migraine or hypoperfusion event since her heart rate  went down to the 30s.  She was discharged with a diagnosis of TIA possibly  complicated migraine.  It appears she has had several episodes where she has been seen in the emergency room with similar symptoms such as blurry vision, headache, left limb weakness, aphasia, as well as a confirmation of what appears to be a scintillating scotoma on exam which is consistent with a migraine. She was discharged prior to getting an MRI of the brain. 12/12/2023: She has not had a headche since 01/2022 since addressing the dental disease. She has dentures. No heaaches, she used tolay in bed for days, since removing all hr teeth after we found. She can have an mri. She has in the emergency room last month she was tring to say crock pot meals and it would not come out. She then felt sleepy. Then she went to sleep, she had a severe headache in the frontal area, she woke up hours later and went to the ER, she last had an episode prior to this after havng her AICD placed in the past, this recent event she drover herself, she denies any migraines and that this was migraines, the headache lasted 2-3 seconds like a severe acute pain with the aphasia. She had blurry vision but she could still read the TV.She sees zigzags across the eyes from one side to the other. She states this was not a migraine however. This time was brief, she saw the zig zag lines. She has NEVER jad the speech problems with thes incidents in the past.   Reviewed notes, labs and imaging from outside physicians, which showed ***  Review of Systems: Patient complains of symptoms per HPI as well as the following symptoms ***. Pertinent negatives and positives per HPI. All others negative.   Social History   Socioeconomic History   Marital status: Divorced    Spouse name: Not on file   Number of children: 3   Years of education: college   Highest education level: Not on file  Occupational History   Occupation: retired    Comment: Airline pilot  Tobacco Use   Smoking status: Never   Smokeless tobacco: Never  Vaping Use    Vaping status: Never Used  Substance and Sexual Activity   Alcohol use: Not Currently    Comment: rare   Drug use: No   Sexual activity: Not Currently  Other Topics Concern   Not on file  Social History Narrative   Divorced, 3 children, 9 grandchildren. Did work for [employer]. Retired at 78 y/o .   Social Drivers of Corporate investment banker Strain: Not on file  Food Insecurity: No Food Insecurity (12/01/2023)   Hunger Vital Sign    Worried About Running Out of Food in the Last Year: Never true    Ran Out of Food in the Last Year: Never true  Transportation Needs: No Transportation Needs (12/01/2023)   PRAPARE - Administrator, Civil Service (Medical): No    Lack of Transportation (Non-Medical): No  Physical Activity: Not on file  Stress: Not on file  Social Connections: Unknown (12/01/2023)   Social Connection and Isolation Panel    Frequency of Communication with Friends and Family: Not on file    Frequency of Social Gatherings with Friends and Family: Not on file    Attends Religious Services: Patient declined    Active Member of Clubs or Organizations: Not on file    Attends Banker Meetings: Not on file  Marital Status: Not on file  Intimate Partner Violence: Not At Risk (12/01/2023)   Humiliation, Afraid, Rape, and Kick questionnaire    Fear of Current or Ex-Partner: No    Emotionally Abused: No    Physically Abused: No    Sexually Abused: No    Family History  Problem Relation Age of Onset   Congestive Heart Failure Mother        died 90   Kidney Stones Mother    Deafness Mother    Atrial fibrillation Mother    Congestive Heart Failure Father    Deafness Father    Healthy Sister    Healthy Sister    Healthy Sister    Congestive Heart Failure Brother    Atrial fibrillation Brother    Tuberculosis Paternal Grandfather    Migraines Neg Hx    Neural tube defect Neg Hx     Past Medical History:  Diagnosis Date   AICD  (automatic cardioverter/defibrillator) present    Anemia    as a child   Anxiety    Arthritis    hands and knees (03/22/2012)   Asthma    Basal cell carcinoma 04/27/2011   mid forehead(MOHS)   Bursitis    CHF (congestive heart failure) (HCC) 09/2011   pt. denies   Depression    Dysrhythmia    WPW   GERD (gastroesophageal reflux disease)    Head injury, acute, with loss of consciousness (HCC)    History of hiatal hernia    Hypertension    ICD (implantable cardiac defibrillator) in place 03/2012   Migraines    migraines    Nausea vomiting and diarrhea 03/18/2015   NICM (nonischemic cardiomyopathy) (HCC)    Obesity (BMI 30-39.9)    negative sleep study 2014   OSA (obstructive sleep apnea) 05/27/2018   Mild with AHI 6/hr    no cpap   Pneumonia    hx   Shortness of breath    @ any time I can get SOB (03/22/2012)   Skin cancer 1999   Squamous cell carcinoma of skin 07/08/2020   in situ right malar cheek-[id number]   Sudden arrhythmia death syndrome    WPW (Wolff-Parkinson-White syndrome)     Patient Active Problem List   Diagnosis Date Noted   TIA (transient ischemic attack) 12/01/2023   Dehydration 10/08/2023   Orthostatic dizziness 10/08/2023   Hypotension 10/07/2023   Lung nodule 06/03/2022   Infection of pacemaker pocket (HCC) 05/20/2022   Cerebral infarction (HCC) 05/19/2022   Acute gout involving toe of right foot 11/25/2021   Age-related osteoporosis without current pathological fracture 11/25/2021   Basal cell carcinoma 11/25/2021   Chronic pain 11/25/2021   Collapsed vertebra, not elsewhere classified, site unspecified, subsequent encounter for fracture with routine healing 11/25/2021   Congestive heart failure (HCC) 11/25/2021   Exacerbation of asthma 11/25/2021   Hardening of the aorta (main artery of the heart) (HCC) 11/25/2021   Hypercoagulable state (HCC) 11/25/2021   Gout 11/25/2021   Intestinal malabsorption 11/25/2021   Mild intermittent asthma  11/25/2021   Mixed hyperlipidemia 11/25/2021   Moderate recurrent major depression (HCC) 11/25/2021   Primary insomnia 11/25/2021   Seasonal allergic rhinitis 11/25/2021   Severe recurrent major depression without psychotic features (HCC) 11/25/2021   Unspecified cirrhosis of liver (HCC) 11/25/2021   Osteoarthritis of knee 11/25/2021   Paroxysmal atrial fibrillation (HCC) 08/20/2019   Acute medial meniscus tear of right knee 08/09/2019   Chondromalacia, right knee 08/09/2019   SVT (supraventricular  tachycardia) (HCC) 11/02/2018   ICD (implantable cardioverter-defibrillator) in place 11/02/2018   OSA (obstructive sleep apnea) 05/27/2018   Acute medial meniscus tear, left, initial encounter 11/29/2017   Chondromalacia of left knee 11/29/2017   Complicated migraine 09/23/2017   Anxiety 09/23/2017   Depression 09/23/2017   Pacemaker 09/23/2017   Chronic systolic heart failure (HCC) 04/28/2015   Diarrhea 03/18/2015   CAP (community acquired pneumonia) 03/18/2015   Nausea vomiting and diarrhea 03/18/2015   Compression fracture 06/05/2014   Essential hypertension 03/27/2014   Chest pain 01/27/2014   Normal coronary arteries 01/27/2014   Obesity (BMI 30-39.9)    ICD- MDT implanted Dec 2013 03/26/2013   NICM (nonischemic cardiomyopathy) (HCC) 02/21/2013   GERD (gastroesophageal reflux disease) 04/23/2012   Asthma in adult, mild persistent, uncomplicated 04/23/2012   Shortness of breath 04/23/2012   Syncope 03/01/2012   Acute on chronic systolic CHF (congestive heart failure) (HCC) 03/01/2012   Type B WPW syndrome- RFA Dec 2013 03/01/2012    Past Surgical History:  Procedure Laterality Date   BIOPSY  09/07/2021   Procedure: BIOPSY;  Surgeon: Elicia Claw, MD;  Location: [facility];  Service: Gastroenterology;;   JUVENTINO LLANO N/A 05/16/2022   Procedure: BIV LLANO;  Surgeon: Waddell Danelle ORN, MD;  Location: [facility];  Service: Cardiovascular;  Laterality: N/A;    CARDIAC CATHETERIZATION  09/18/2011   no significant CAD   CARDIAC DEFIBRILLATOR PLACEMENT  03/22/2012   DDD   CHOLECYSTECTOMY  ~ 2003   ESOPHAGOGASTRODUODENOSCOPY (EGD) WITH PROPOFOL  N/A 09/07/2021   Procedure: ESOPHAGOGASTRODUODENOSCOPY (EGD) WITH PROPOFOL ;  Surgeon: Elicia Claw, MD;  Location: [facility];  Service: Gastroenterology;  Laterality: N/A;   IMPLANTABLE CARDIOVERTER DEFIBRILLATOR IMPLANT N/A 03/22/2012   Procedure: IMPLANTABLE CARDIOVERTER DEFIBRILLATOR IMPLANT;  Surgeon: Danelle ORN Waddell, MD;  Location[facility];  Service: Cardiovascular;  Laterality: N/A;   KNEE ARTHROSCOPY Left 11/29/2017   Procedure: ARTHROSCOPY LEFT KNEE;  Surgeon: Yvone Rush, MD;  Location: [facility];  Service: Orthopedics;  Laterality: Left;   KNEE ARTHROSCOPY Right 08/09/2019   Procedure: ARTHROSCOPY RIGHT KNEE, CHONDROPLASTY,  PARTIAL MEDIAL  MENSICECTOMY;  Surgeon: Yvone Rush, MD;  Location: [facility];  Service: Orthopedics;  Laterality: Right;   KYPHOPLASTY N/A 11/27/2013   Procedure: KYPHOPLASTY;  Surgeon: Oneil Rodgers Priestly, MD;  Location: [facility];  Service: Orthopedics;  Laterality: N/A;  T9, T11 kyphoplasty   KYPHOPLASTY N/A 06/05/2014   Procedure: KYPHOPLASTY;  Surgeon: Oneil Rodgers Priestly, MD;  Location: [facility] OR;  Service: Orthopedics;  Laterality: N/A;  T7 kyphoplasty   MOHS SURGERY  06/10/2011   forehead (03/22/2012)   SKIN CANCER EXCISION  04/11/1997   top of my head and my right back shoulder   SKIN CANCER EXCISION     back   SKIN CANCER EXCISION     face   SUPRAVENTRICULAR TACHYCARDIA ABLATION  03/22/2012   unable to induce VT/RN report 03/22/2012   SUPRAVENTRICULAR TACHYCARDIA ABLATION N/A 03/22/2012   Procedure: SUPRAVENTRICULAR TACHYCARDIA ABLATION;  Surgeon: Danelle ORN Waddell, MD;  Location: [facility] LAB;  Service: Cardiovascular;  Laterality: N/A;   TONSILLECTOMY AND ADENOIDECTOMY  04/11/1948   TUBAL LIGATION  04/11/1976    Current Outpatient Medications  Medication  Sig Dispense Refill   acetaminophen  (TYLENOL ) 500 MG tablet Take 500 mg by mouth in the morning and at bedtime.     albuterol  (VENTOLIN  HFA) 108 (90 Base) MCG/ACT inhaler INHALE 2 PUFFS BY MOUTH EVERY 6 HOURS AS NEEDED FOR WHEEZING OR SHORTNESS OF BREATH 9  g 0   apixaban  (ELIQUIS ) 5 MG TABS tablet Take 1 tablet by mouth twice daily 180 tablet 1   Calcium  Carb-Cholecalciferol  600-20 MG-MCG TABS Take 1 tablet by mouth in the morning.     carvedilol  (COREG ) 6.25 MG tablet Take 1 tablet by mouth twice daily 180 tablet 2   cetirizine (ZYRTEC) 10 MG tablet Take 10 mg by mouth in the morning.     Cholecalciferol  (VITAMIN D3) 5000 units CAPS Take 5,000 Units by mouth in the morning.     colestipol  (COLESTID ) 1 g tablet Take 2 tablets (2 g total) by mouth daily. 60 tablet 0   Collagen-Vitamin C  (COLLAGEN PLUS VITAMIN C  PO) Take 1 tablet by mouth in the morning.     Cyanocobalamin  (VITAMIN B-12) 5000 MCG LOZG Take 5,000 mcg by mouth in the morning.     escitalopram  (LEXAPRO ) 20 MG tablet Take 20 mg by mouth in the morning.     fluticasone  furoate-vilanterol (BREO ELLIPTA ) 200-25 MCG/ACT AEPB Inhale 1 puff by mouth once daily 60 each 11   furosemide  (LASIX ) 40 MG tablet TAKE 1 TO 2 TABLETS BY MOUTH AS DIRECTED ONCE DAILY FOR  EDEMA,  SHORTNESS  OF  BREATH  OR  WEIGHT  GAIN. 180 tablet 2   ipratropium-albuterol  (DUONEB) 0.5-2.5 (3) MG/3ML SOLN Take 3 mLs by nebulization every 4 (four) hours as needed. 360 mL 11   MOUNJARO 2.5 MG/0.5ML Pen Inject 2.5 mg into the skin once a week.     Multiple Vitamin (MULTIVITAMIN WITH MINERALS) TABS tablet Take 1 tablet by mouth in the morning.     pantoprazole  (PROTONIX ) 40 MG tablet Take 40 mg by mouth 2 (two) times daily.  0   rosuvastatin  (CRESTOR ) 5 MG tablet Take 5 mg by mouth at bedtime.     sacubitril -valsartan  (ENTRESTO ) 24-26 MG Take 1 tablet by mouth twice daily 180 tablet 3   spironolactone  (ALDACTONE ) 25 MG tablet Take 1 tablet by mouth once daily 90 tablet 3    Zinc  100 MG TABS Take 1 tablet by mouth daily at 6 (six) AM.     No current facility-administered medications for this visit.    Allergies as of 12/12/2023 - Review Complete 12/04/2023  Allergen Reaction Noted   Ivp dye [iodinated contrast media] Hives and Other (See Comments) 03/07/2012   Penicillins Anaphylaxis 09/12/2011   Tessalon [benzonatate] Hives and Rash 04/03/2015   Sulfa antibiotics Other (See Comments) 10/16/2014   Sulfonamide derivatives Other (See Comments) 09/12/2011   Rosuvastatin  calcium  Itching 03/17/2011    Vitals: There were no vitals taken for this visit. Last Weight:  Wt Readings from Last 1 Encounters:  12/04/23 178 lb 1.6 oz (80.8 kg)   Last Height:   Ht Readings from Last 1 Encounters:  12/04/23 5' 3 (1.6 m)     Physical exam: Exam: Gen: NAD, conversant, well nourised, obese, well groomed                     CV: RRR, no MRG. No Carotid Bruits. No peripheral edema, warm, nontender Eyes: Conjunctivae clear without exudates or hemorrhage  Neuro: Detailed Neurologic Exam  Speech:    Speech is normal; fluent and spontaneous with normal comprehension.  Cognition:    The patient is oriented to person, place, and time;     recent and remote memory intact;     language fluent;     normal attention, concentration,     fund of knowledge Cranial Nerves:  The pupils are equal, round, and reactive to light. The fundi are normal and spontaneous venous pulsations are present. Visual fields are full to finger confrontation. Extraocular movements are intact. Trigeminal sensation is intact and the muscles of mastication are normal. The face is symmetric. The palate elevates in the midline. Hearing intact. Voice is normal. Shoulder shrug is normal. The tongue has normal motion without fasciculations.   Coordination:    Normal finger to nose and heel to shin. Normal rapid alternating movements.   Gait:    Heel-toe and tandem gait are normal.   Motor  Observation:    No asymmetry, no atrophy, and no involuntary movements noted. Tone:    Normal muscle tone.    Posture:    Posture is normal. normal erect    Strength:    Strength is V/V in the upper and lower limbs.      Sensation: intact to LT     Reflex Exam:  DTR's:    Deep tendon reflexes in the upper and lower extremities are normal bilaterally.   Toes:    The toes are downgoing bilaterally.   Clonus:    Clonus is absent.    Assessment/Plan:    No orders of the defined types were placed in this encounter.  No orders of the defined types were placed in this encounter.   Cc: Raenelle Donalda HERO, MD,  Frederik Charleston, MD  Onetha Epp, MD  [facility] [address] [place], [place] [address]  Phone [phone] Fax [phone]

## 2023-12-14 NOTE — Progress Notes (Addendum)
 EPIC Encounter for ICM Monitoring  Patient Name: Paula Booker is a 78 y.o. female Date: 12/14/2023 Primary Care Physican: Frederik Charleston, MD Primary Cardiologist: Waddell Electrophysiologist: Waddell BiV Pacing: 96.2% 08/01/2022 Office Weight: 191 lbs 09/07/2022 Weight: 191 lbs 01/27/2023 Weight: unknown 05/04/2023 Weight: 189 lbs 06/07/2023 Weight: 186 lbs 07/11/2023 Weight: 180 lbs 08/16/2023 Weight: 172 lbs (loss of 40 pounds over the past 2 years) 09/20/2023 Weight: 177 - 180 lbs 11/08/2023 Weight: 174 lbs (stable) 11/15/2023 Weight: 172 lbs 12/14/2023 Weight: 169 lbs   Time in AT/AF  0.0 hours/day (0.0 %)          Spoke with patient and heart failure questions reviewed.  Transmission results reviewed.  Pt asymptomatic for fluid accumulation.  Reports feeling well at this time and voices no complaints.    Hospitalization 8/22-8/23   Optivol thoracic impedance suggesting normal fluid levels since 8/3.      Prescribed:  Furosemide  40 mg take 1-2 tablets (40 mg - 80 mg total) by mouth as directed once daily for edema, SOB or weight gain.   Pt reports on 07/11/23 she normally takes 1 tablet daily and only takes 2nd one if needed for fluid symptoms.   Labs: 12/01/2023 Creatinine 0.90, BUN 24, Potassium 3.8, Sodium 139, GFR >60 10/09/2023 Creatinine 0.35, BUN 11, Potassium 3.7, Sodium 140, GFR >60  10/08/2023 Creatinine 0.75, BUN 17, Potassium 3.1, Sodium 137, GFR >60  10/07/2023 Creatinine 0.78, BUN 22, Potassium 3.5, Sodium 137, GFR >60  A complete set of results can be found in Results Review.   Recommendations:  No changes and encouraged to call if experiencing any fluid symptoms.     Follow-up plan: ICM clinic phone appointment on 01/22/2024.   91 day device clinic remote transmission 02/14/2024.     EP/Cardiology Office Visits:  Recall 08/16/2024 with Dr Waddell.   Recall 11/28/2024 with Dr Michele (1st visit)   Copy of ICM check sent to Dr. Waddell.    3 month ICM trend:  12/12/2023.    12-14 Month ICM trend:     Mitzie GORMAN Garner, RN 12/14/2023 8:15 AM

## 2024-01-02 ENCOUNTER — Emergency Department (HOSPITAL_COMMUNITY)

## 2024-01-02 ENCOUNTER — Emergency Department (HOSPITAL_COMMUNITY)
Admission: EM | Admit: 2024-01-02 | Discharge: 2024-01-03 | Disposition: A | Discharge status: Home / Self Care | Attending: Emergency Medicine | Admitting: Emergency Medicine

## 2024-01-02 ENCOUNTER — Other Ambulatory Visit: Payer: Self-pay

## 2024-01-02 DIAGNOSIS — R9389 Abnormal findings on diagnostic imaging of other specified body structures: Secondary | ICD-10-CM | POA: Diagnosis not present

## 2024-01-02 DIAGNOSIS — R42 Dizziness and giddiness: Secondary | ICD-10-CM | POA: Diagnosis not present

## 2024-01-02 DIAGNOSIS — Z79899 Other long term (current) drug therapy: Secondary | ICD-10-CM | POA: Diagnosis not present

## 2024-01-02 DIAGNOSIS — H5710 Ocular pain, unspecified eye: Secondary | ICD-10-CM | POA: Diagnosis not present

## 2024-01-02 DIAGNOSIS — Z7901 Long term (current) use of anticoagulants: Secondary | ICD-10-CM | POA: Insufficient documentation

## 2024-01-02 DIAGNOSIS — I4891 Unspecified atrial fibrillation: Secondary | ICD-10-CM | POA: Diagnosis not present

## 2024-01-02 DIAGNOSIS — G459 Transient cerebral ischemic attack, unspecified: Secondary | ICD-10-CM | POA: Insufficient documentation

## 2024-01-02 DIAGNOSIS — I10 Essential (primary) hypertension: Secondary | ICD-10-CM | POA: Diagnosis not present

## 2024-01-02 DIAGNOSIS — Z6829 Body mass index (BMI) 29.0-29.9, adult: Secondary | ICD-10-CM | POA: Diagnosis not present

## 2024-01-02 DIAGNOSIS — I6789 Other cerebrovascular disease: Secondary | ICD-10-CM | POA: Diagnosis not present

## 2024-01-02 DIAGNOSIS — H539 Unspecified visual disturbance: Secondary | ICD-10-CM

## 2024-01-02 LAB — CBC
HCT: 43.4 % (ref 36.0–46.0)
Hemoglobin: 14.5 g/dL (ref 12.0–15.0)
MCH: 31 pg (ref 26.0–34.0)
MCHC: 33.4 g/dL (ref 30.0–36.0)
MCV: 92.9 fL (ref 80.0–100.0)
Platelets: 271 K/uL (ref 150–400)
RBC: 4.67 MIL/uL (ref 3.87–5.11)
RDW: 12.7 % (ref 11.5–15.5)
WBC: 8.9 K/uL (ref 4.0–10.5)
nRBC: 0 % (ref 0.0–0.2)

## 2024-01-02 LAB — I-STAT CHEM 8, ED
BUN: 32 mg/dL — ABNORMAL HIGH (ref 8–23)
Calcium, Ion: 1.21 mmol/L (ref 1.15–1.40)
Chloride: 102 mmol/L (ref 98–111)
Creatinine, Ser: 1.1 mg/dL — ABNORMAL HIGH (ref 0.44–1.00)
Glucose, Bld: 108 mg/dL — ABNORMAL HIGH (ref 70–99)
HCT: 43 % (ref 36.0–46.0)
Hemoglobin: 14.6 g/dL (ref 12.0–15.0)
Potassium: 4.3 mmol/L (ref 3.5–5.1)
Sodium: 139 mmol/L (ref 135–145)
TCO2: 28 mmol/L (ref 22–32)

## 2024-01-02 LAB — DIFFERENTIAL
Abs Immature Granulocytes: 0.03 K/uL (ref 0.00–0.07)
Basophils Absolute: 0 K/uL (ref 0.0–0.1)
Basophils Relative: 1 %
Eosinophils Absolute: 0.3 K/uL (ref 0.0–0.5)
Eosinophils Relative: 3 %
Immature Granulocytes: 0 %
Lymphocytes Relative: 31 %
Lymphs Abs: 2.7 K/uL (ref 0.7–4.0)
Monocytes Absolute: 0.8 K/uL (ref 0.1–1.0)
Monocytes Relative: 9 %
Neutro Abs: 5 K/uL (ref 1.7–7.7)
Neutrophils Relative %: 56 %

## 2024-01-02 LAB — CBG MONITORING, ED: Glucose-Capillary: 108 mg/dL — ABNORMAL HIGH (ref 70–99)

## 2024-01-02 LAB — COMPREHENSIVE METABOLIC PANEL WITH GFR
ALT: 13 U/L (ref 0–44)
AST: 24 U/L (ref 15–41)
Albumin: 4.1 g/dL (ref 3.5–5.0)
Alkaline Phosphatase: 68 U/L (ref 38–126)
Anion gap: 11 (ref 5–15)
BUN: 23 mg/dL (ref 8–23)
CO2: 25 mmol/L (ref 22–32)
Calcium: 9.8 mg/dL (ref 8.9–10.3)
Chloride: 101 mmol/L (ref 98–111)
Creatinine, Ser: 0.95 mg/dL (ref 0.44–1.00)
GFR, Estimated: >60 mL/min (ref >60)
Glucose, Bld: 106 mg/dL — ABNORMAL HIGH (ref 70–99)
Potassium: 4.1 mmol/L (ref 3.5–5.1)
Sodium: 137 mmol/L (ref 135–145)
Total Bilirubin: 0.7 mg/dL (ref 0.0–1.2)
Total Protein: 6.4 g/dL — ABNORMAL LOW (ref 6.5–8.1)

## 2024-01-02 LAB — ETHANOL: Alcohol, Ethyl (B): <15 mg/dL (ref <15)

## 2024-01-02 MED ORDER — GADOBUTROL 1 MMOL/ML IV SOLN
8.0000 mL | Freq: Once | INTRAVENOUS | Status: AC | PRN
Start: 2024-01-02 — End: 2024-01-02
  Administered 2024-01-02: 8 mL via INTRAVENOUS

## 2024-01-02 NOTE — ED Notes (Signed)
 A dinner tray ordered for the patient. Per [facility], the tray should arrive by 7:50pm

## 2024-01-02 NOTE — ED Notes (Signed)
 Patient transported to MRI by primary RN who remains with pt during scan

## 2024-01-02 NOTE — ED Triage Notes (Signed)
 Pt bib ems from Neurology office due to left sided eye pain and blurry vision in left eye. Pt has MRI scheduled next week of head and carotid. PT states blurry vision started this morning around 930.   126/78 72 hr 98% RA 114 cbg

## 2024-01-02 NOTE — ED Provider Notes (Addendum)
 MRI negative for any acute infarct.  It was a normal brain MRI.  Patient gives a history of visual changes suggestive may be TIAs going all the way back to 2024.  Patient is followed by neurology.  And they have ordered some carotid studies.  She was limited having MRI so today we were able to do that because she got a new pacemaker.  Patient had CT angio head and neck with recent admission for TIA at the end of August.  There was no significant abnormalities with that.  Will run this past neurology.  But probably patient is okay for discharge home and follow-up.  Neurology did not see anything that they needed to do acutely particular being on Eliquis .  Did recommend that discussion and follow-up with ophthalmology would maybe be prudent.         Zarin Knupp, MD 01/02/24 [id number]    Breea Loncar, MD 01/02/24 [id number]

## 2024-01-02 NOTE — ED Notes (Signed)
 Checked patient cbg it was 59 notified RN of blood sugar

## 2024-01-02 NOTE — ED Notes (Signed)
 Pt received a taxi voucher home.[email or web address]

## 2024-01-02 NOTE — ED Provider Notes (Signed)
 [place] EMERGENCY DEPARTMENT AT [facility] Provider Note   CSN: [id number] Arrival date & time: 01/02/24  1207     Patient presents with: Eye Problem   Paula Booker is a 78 y.o. female.  {Add pertinent medical, surgical, social history, OB history to HPI:6367} 78 year old female with history of WPW status post ICD (Medtronic), atrial fibrillation on Eliquis , and TIA who presents to the emergency department with vision changes.  Patient reports that she was at her primary doctor's office today and she started having blurry vision out of the left side of her visual field.  Also had some eye twitching.  No pain at that point in time.  No flashes or floaters.  [place] that her primary doctor referred her into the emergency department for evaluation.  Says that her symptoms resolved just prior to arrival.  No numbness or weakness.  No difficulty speaking recently.  Has had complex migraines in the past that she is following with neurology about and they are concerned about TIA versus stroke versus migraine with aura.  They have written for an MRI of the brain with and without contrast but she has not gotten that yet       Prior to Admission medications   Medication Sig Start Date End Date Taking? Authorizing Provider  acetaminophen  (TYLENOL ) 500 MG tablet Take 500 mg by mouth in the morning and at bedtime.    [provider]  albuterol  (VENTOLIN  HFA) 108 (90 Base) MCG/ACT inhaler INHALE 2 PUFFS BY MOUTH EVERY 6 HOURS AS NEEDED FOR WHEEZING OR SHORTNESS OF BREATH 07/11/23   Mannam, Praveen, MD  apixaban  (ELIQUIS ) 5 MG TABS tablet Take 1 tablet by mouth twice daily 09/07/23   Dann Candyce RAMAN, MD  Calcium  Carb-Cholecalciferol  600-20 MG-MCG TABS Take 1 tablet by mouth in the morning.    [provider]  carvedilol  (COREG ) 6.25 MG tablet Take 1 tablet by mouth twice daily 06/15/23   Waddell Danelle ORN, MD  cetirizine (ZYRTEC) 10 MG tablet Take 10 mg by mouth in the  morning.    [provider]  Cholecalciferol  (VITAMIN D3) 5000 units CAPS Take 5,000 Units by mouth in the morning.    [provider]  colestipol  (COLESTID ) 1 g tablet Take 2 tablets (2 g total) by mouth daily. Patient not taking: Reported on 12/12/2023 10/10/23 11/30/24  Jillian Buttery, MD  Collagen-Vitamin C  (COLLAGEN PLUS VITAMIN C  PO) Take 1 tablet by mouth in the morning.    [provider]  Cyanocobalamin  (VITAMIN B-12) 5000 MCG LOZG Take 5,000 mcg by mouth in the morning.    [provider]  escitalopram  (LEXAPRO ) 20 MG tablet Take 20 mg by mouth in the morning. 10/23/20   [provider]  fluticasone  furoate-vilanterol (BREO ELLIPTA ) 200-25 MCG/ACT AEPB Inhale 1 puff by mouth once daily 06/14/23   Mannam, Praveen, MD  furosemide  (LASIX ) 40 MG tablet TAKE 1 TO 2 TABLETS BY MOUTH AS DIRECTED ONCE DAILY FOR  EDEMA,  SHORTNESS  OF  BREATH  OR  WEIGHT  GAIN. 12/14/22   Waddell Danelle ORN, MD  ipratropium-albuterol  (DUONEB) 0.5-2.5 (3) MG/3ML SOLN Take 3 mLs by nebulization every 4 (four) hours as needed. 06/03/22   Mannam, Praveen, MD  MOUNJARO 2.5 MG/0.5ML Pen Inject 2.5 mg into the skin once a week. 08/02/23   [provider]  Multiple Vitamin (MULTIVITAMIN WITH MINERALS) TABS tablet Take 1 tablet by mouth in the morning.    [provider]  pantoprazole  (  PROTONIX ) 40 MG tablet Take 40 mg by mouth 2 (two) times daily. 08/29/17   [provider]  rosuvastatin  (CRESTOR ) 5 MG tablet Take 5 mg by mouth at bedtime. 11/18/20   [provider]  sacubitril -valsartan  (ENTRESTO ) 24-26 MG Take 1 tablet by mouth twice daily 09/07/23   Waddell Danelle ORN, MD  spironolactone  (ALDACTONE ) 25 MG tablet Take 1 tablet by mouth once daily 10/26/23   Waddell Danelle ORN, MD  Zinc  100 MG TABS Take 1 tablet by mouth daily at 6 (six) AM.    [provider]    Allergies: Ivp dye [iodinated contrast media], Penicillins, Tessalon [benzonatate], Sulfa  antibiotics, Sulfonamide derivatives, and Rosuvastatin  calcium     Review of Systems  Updated Vital Signs BP 122/71 (BP Location: Left Arm)   Pulse 71   Temp 97.7 F (36.5 C) (Oral)   Resp 18   SpO2 98%   Physical Exam Vitals reviewed: Patient evaluated in hallway chair because no beds available.  Constitutional:      Appearance: Normal appearance.  Eyes:     Extraocular Movements: Extraocular movements intact.     Conjunctiva/sclera: Conjunctivae normal.     Pupils: Pupils are equal, round, and reactive to light.     Comments: Pupils 4 mm bilaterally  Neurological:     Mental Status: She is alert.     Comments: NIHSS Exam  Level of Consciousness: Alert  LOC Questions: Answers Month and Age Correctly  LOC Commands: Opens and Closes Eyes and Hands on command  Best Gaze: Horizontal ocular movements intact  Visual Fields: No visual field loss  Facial Palsy: None  L Upper Extremity Motor: No drift after 10 seconds  R Upper Extremity Motor: No drift after 10 seconds  L Lower extremity Motor: No drift after 5 seconds  R Lower extremity Motor: No drift after 5 seconds  Ataxia: Absent  Sensory: Intact sensation to light touch on face, arms, trunk, and legs bilaterally  Best Language: No aphasia  Dysarthria: No dysarthria  Neglect: No visual or sensory neglect       (all labs ordered are listed, but only abnormal results are displayed) Labs Reviewed - No data to display  EKG: None  Radiology: No results found.  {Document cardiac monitor, telemetry assessment procedure when appropriate:32947} Procedures   Medications Ordered in the ED - No data to display    {Click here for ABCD2, HEART and other calculators REFRESH Note before signing:1}                              Medical Decision Making Amount and/or Complexity of Data Reviewed Labs: ordered. Radiology: ordered.   ***  {Document critical care time when appropriate  Document review of labs and clinical  decision tools ie [id number], etc  Document your independent review of radiology images and any outside records  Document your discussion with family members, caretakers and with consultants  Document social determinants of health affecting pt's care  Document your decision making why or why not admission, treatments were needed:32947:::1}   Final diagnoses:  None    ED Discharge Orders     None

## 2024-01-02 NOTE — Discharge Instructions (Signed)
 Neurology recommended that you follow-up with your ophthalmologist just to have a good eye exam.  Tonight's MRI without evidence of any acute stroke.  Continue to take your Eliquis .  Follow-up with your doctors as planned.  Check with your neurologist to see if they still want to do the study planned for later this week.

## 2024-01-02 NOTE — ED Notes (Signed)
 Care assumed of pt no report given at this time

## 2024-01-02 NOTE — CV Procedure (Signed)
  Device system confirmed to be MRI conditional, with implant date > 6 weeks ago, and no evidence of abandoned or epicardial leads in review of most recent CXR  Device last cleared by EP Provider: Prentice Passey   Clearance is good through for 1 year as long as parameters remain stable at time of check. If pt undergoes a cardiac device procedure during that time, they should be re-cleared.   Tachy-therapies to be programmed off if applicable with device back to pre-MRI settings after completion of exam.  Medtronic - Programming recommendation received through Medtronic App/Tablet  Emogene Bouchard, RT  01/02/2024 2:08 PM

## 2024-01-02 NOTE — ED Notes (Addendum)
 Visual acuity test R eye - stopped at 20/32 L eye - stopped at 20/40

## 2024-01-04 ENCOUNTER — Telehealth: Payer: Self-pay | Admitting: Neurology

## 2024-01-04 NOTE — Telephone Encounter (Signed)
 She had her MRI done in the ED on 9/23 ordered by their provider so her MRI appt/order from us  will be cancelled.

## 2024-01-08 NOTE — Progress Notes (Signed)
 Remote ICD Transmission

## 2024-01-09 ENCOUNTER — Ambulatory Visit (HOSPITAL_COMMUNITY): Admission: RE | Admit: 2024-01-09 | Source: Ambulatory Visit

## 2024-01-12 DIAGNOSIS — I429 Cardiomyopathy, unspecified: Secondary | ICD-10-CM | POA: Diagnosis not present

## 2024-01-12 DIAGNOSIS — Z1211 Encounter for screening for malignant neoplasm of colon: Secondary | ICD-10-CM | POA: Diagnosis not present

## 2024-01-12 DIAGNOSIS — K219 Gastro-esophageal reflux disease without esophagitis: Secondary | ICD-10-CM | POA: Diagnosis not present

## 2024-01-12 DIAGNOSIS — G4733 Obstructive sleep apnea (adult) (pediatric): Secondary | ICD-10-CM | POA: Diagnosis not present

## 2024-01-12 DIAGNOSIS — E782 Mixed hyperlipidemia: Secondary | ICD-10-CM | POA: Diagnosis not present

## 2024-01-12 DIAGNOSIS — H538 Other visual disturbances: Secondary | ICD-10-CM | POA: Diagnosis not present

## 2024-01-12 DIAGNOSIS — M109 Gout, unspecified: Secondary | ICD-10-CM | POA: Diagnosis not present

## 2024-01-12 DIAGNOSIS — M810 Age-related osteoporosis without current pathological fracture: Secondary | ICD-10-CM | POA: Diagnosis not present

## 2024-01-12 DIAGNOSIS — I10 Essential (primary) hypertension: Secondary | ICD-10-CM | POA: Diagnosis not present

## 2024-01-15 NOTE — Telephone Encounter (Signed)
 No need for carotid u/s. Had CTA head / neck in Aug 2025. -VRP

## 2024-01-15 NOTE — Telephone Encounter (Signed)
 Does not look like the carotid dopplers were ordered yet. Dr Margaret, will you address as work-in? I can then have [place] reassign to a new provider going forward.

## 2024-01-17 DIAGNOSIS — G459 Transient cerebral ischemic attack, unspecified: Secondary | ICD-10-CM | POA: Diagnosis not present

## 2024-01-17 NOTE — Telephone Encounter (Signed)
 Spoke with patient and scheduled f/u with Dr Vear for 07/31/24 at 3pm

## 2024-01-22 ENCOUNTER — Ambulatory Visit: Attending: Internal Medicine

## 2024-01-22 DIAGNOSIS — I5020 Unspecified systolic (congestive) heart failure: Secondary | ICD-10-CM

## 2024-01-22 DIAGNOSIS — Z95810 Presence of automatic (implantable) cardiac defibrillator: Secondary | ICD-10-CM | POA: Diagnosis not present

## 2024-01-26 NOTE — Progress Notes (Signed)
 EPIC Encounter for ICM Monitoring  Patient Name: Paula Booker is a 78 y.o. female Date: 01/26/2024 Primary Care Physican: Frederik Charleston, MD Primary Cardiologist: Waddell Electrophysiologist: Waddell BiV Pacing: 93.8% 08/01/2022 Office Weight: 191 lbs 09/07/2022 Weight: 191 lbs 01/27/2023 Weight: unknown 05/04/2023 Weight: 189 lbs 06/07/2023 Weight: 186 lbs 07/11/2023 Weight: 180 lbs 08/16/2023 Weight: 172 lbs (loss of 40 pounds over the past 2 years) 09/20/2023 Weight: 177 - 180 lbs 11/08/2023 Weight: 174 lbs (stable) 11/15/2023 Weight: 172 lbs 12/14/2023 Weight: 169 lbs   Time in AT/AF  0.0 hours/day (0.0 %)          Spoke with patient and heart failure questions reviewed.  Transmission results reviewed.  Pt asymptomatic for fluid accumulation.  Reports feeling well at this time and voices no complaints.    ED visit 01/02/2024 and the follow up physician thinks she was having Migraine with aura.   Since 12/12/2023 ICM Remote Transmission: Optivol thoracic impedance suggesting normal fluid levels.      Prescribed:  Furosemide  40 mg take 1-2 tablets (40 mg - 80 mg total) by mouth as directed once daily for edema, SOB or weight gain.   Pt reports on 07/11/23 she normally takes 1 tablet daily and only takes 2nd one if needed for fluid symptoms.   Labs: 01/02/2024 Creatinine 0.95, BUN 23, Potassium 4.1, Sodium 137, GFR >60 12/01/2023 Creatinine 0.90, BUN 24, Potassium 3.8, Sodium 139, GFR >60 10/09/2023 Creatinine 0.35, BUN 11, Potassium 3.7, Sodium 140, GFR >60  10/08/2023 Creatinine 0.75, BUN 17, Potassium 3.1, Sodium 137, GFR >60  10/07/2023 Creatinine 0.78, BUN 22, Potassium 3.5, Sodium 137, GFR >60  A complete set of results can be found in Results Review.   Recommendations:  No changes and encouraged to call if experiencing any fluid symptoms.     Follow-up plan: ICM clinic phone appointment on 02/26/2024.   91 day device clinic remote transmission 02/14/2024.     EP/Cardiology Office  Visits:  Recall 08/16/2024 with Dr Waddell.   Recall 11/28/2024 with Dr Michele (1st visit)   Copy of ICM check sent to Dr. Waddell.    Remote monitoring is medically necessary for Heart Failure Management.    Daily Thoracic Impedance ICM trend: 10/23/2023 through 01/22/2024.    12-14 Month Thoracic Impedance ICM trend:     Mitzie GORMAN Garner, RN 01/26/2024 10:04 AM

## 2024-02-14 ENCOUNTER — Ambulatory Visit: Payer: Medicare HMO

## 2024-02-14 ENCOUNTER — Other Ambulatory Visit: Payer: Self-pay | Admitting: Internal Medicine

## 2024-02-14 DIAGNOSIS — I480 Paroxysmal atrial fibrillation: Secondary | ICD-10-CM | POA: Diagnosis not present

## 2024-02-15 LAB — CUP PACEART REMOTE DEVICE CHECK
Battery Remaining Longevity: 115 mo
Battery Voltage: 3 V
Brady Statistic RV Percent Paced: 1.55 %
Date Time Interrogation Session: 20251106105109
HighPow Impedance: 86 Ohm
Implantable Lead Connection Status: 753985
Implantable Lead Connection Status: 753985
Implantable Lead Connection Status: 753985
Implantable Lead Implant Date: 20131212
Implantable Lead Implant Date: 20131212
Implantable Lead Implant Date: 20240205
Implantable Lead Location: 753858
Implantable Lead Location: 753859
Implantable Lead Location: 753860
Implantable Lead Model: 4598
Implantable Lead Model: 5076
Implantable Lead Model: 6935
Implantable Pulse Generator Implant Date: 20240205
Lead Channel Impedance Value: 1007 Ohm
Lead Channel Impedance Value: 1026 Ohm
Lead Channel Impedance Value: 1045 Ohm
Lead Channel Impedance Value: 1064 Ohm
Lead Channel Impedance Value: 1159 Ohm
Lead Channel Impedance Value: 380 Ohm
Lead Channel Impedance Value: 418 Ohm
Lead Channel Impedance Value: 475 Ohm
Lead Channel Impedance Value: 570 Ohm
Lead Channel Impedance Value: 684 Ohm
Lead Channel Impedance Value: 741 Ohm
Lead Channel Impedance Value: 741 Ohm
Lead Channel Impedance Value: 798 Ohm
Lead Channel Pacing Threshold Amplitude: 0.375 V
Lead Channel Pacing Threshold Amplitude: 0.875 V
Lead Channel Pacing Threshold Amplitude: 1.625 V
Lead Channel Pacing Threshold Pulse Width: 0.4 ms
Lead Channel Pacing Threshold Pulse Width: 0.4 ms
Lead Channel Pacing Threshold Pulse Width: 0.4 ms
Lead Channel Sensing Intrinsic Amplitude: 3.3 mV
Lead Channel Sensing Intrinsic Amplitude: 7.1 mV
Lead Channel Setting Pacing Amplitude: 2 V
Lead Channel Setting Pacing Amplitude: 2 V
Lead Channel Setting Pacing Amplitude: 2.5 V
Lead Channel Setting Pacing Pulse Width: 0.4 ms
Lead Channel Setting Pacing Pulse Width: 0.4 ms
Lead Channel Setting Sensing Sensitivity: 0.3 mV
Zone Setting Status: 755011
Zone Setting Status: 755011

## 2024-02-16 ENCOUNTER — Ambulatory Visit: Payer: Self-pay | Admitting: Internal Medicine

## 2024-02-16 NOTE — Progress Notes (Signed)
 Remote ICD Transmission

## 2024-02-26 ENCOUNTER — Ambulatory Visit: Attending: Internal Medicine

## 2024-02-26 DIAGNOSIS — I5022 Chronic systolic (congestive) heart failure: Secondary | ICD-10-CM | POA: Diagnosis not present

## 2024-02-26 DIAGNOSIS — Z95810 Presence of automatic (implantable) cardiac defibrillator: Secondary | ICD-10-CM | POA: Diagnosis not present

## 2024-02-28 ENCOUNTER — Other Ambulatory Visit: Payer: Self-pay | Admitting: Interventional Cardiology

## 2024-02-28 DIAGNOSIS — I480 Paroxysmal atrial fibrillation: Secondary | ICD-10-CM

## 2024-02-28 NOTE — Progress Notes (Addendum)
 EPIC Encounter for ICM Monitoring  Patient Name: Paula Booker is a 78 y.o. female Date: 02/28/2024 Primary Care Physican: Frederik Charleston, MD Primary Cardiologist: Waddell Electrophysiologist: Waddell BiV Pacing: 94.4% 08/01/2022 Office Weight: 191 lbs 09/07/2022 Weight: 191 lbs 01/27/2023 Weight: unknown 05/04/2023 Weight: 189 lbs 06/07/2023 Weight: 186 lbs 07/11/2023 Weight: 180 lbs 08/16/2023 Weight: 172 lbs (loss of 40 pounds over the past 2 years) 09/20/2023 Weight: 177 - 180 lbs 11/08/2023 Weight: 174 lbs (stable) 11/15/2023 Weight: 172 lbs 12/14/2023 Weight: 169 lbs 02/18/2024 Weight: 162 lbs (lost total of 50 lbs)   Time in AT/AF  0.0 hours/day (0.0 %)          Spoke with patient and heart failure questions reviewed.  Transmission results reviewed.  Pt asymptomatic for fluid accumulation.  Reports feeling well at this time and voices no complaints.     Since 01/22/2024 ICM Remote Transmission: Optivol thoracic impedance suggesting normal fluid levels with the exception of possible fluid accumulation from 01/19/2024-01/29/2024 and 02/03/2024-02/06/2024.   Prescribed:  Furosemide  40 mg take 1-2 tablets (40 mg - 80 mg total) by mouth as directed once daily for edema, SOB or weight gain.   Pt reports on 07/11/23 she normally takes 1 tablet daily and only takes 2nd one if needed for fluid symptoms.   Labs: 01/02/2024 Creatinine 0.95, BUN 23, Potassium 4.1, Sodium 137, GFR >60 12/01/2023 Creatinine 0.90, BUN 24, Potassium 3.8, Sodium 139, GFR >60 10/09/2023 Creatinine 0.35, BUN 11, Potassium 3.7, Sodium 140, GFR >60  10/08/2023 Creatinine 0.75, BUN 17, Potassium 3.1, Sodium 137, GFR >60  10/07/2023 Creatinine 0.78, BUN 22, Potassium 3.5, Sodium 137, GFR >60  A complete set of results can be found in Results Review.   Recommendations:  No changes and encouraged to call if experiencing any fluid symptoms.   Follow-up plan: ICM clinic phone appointment on 04/08/2024.   91 day device clinic  remote transmission 05/15/2024.     EP/Cardiology Office Visits:  Recall 08/16/2024 with Dr Waddell.   Recall 11/28/2024 with Dr Michele (1st visit)   Copy of ICM check sent to Dr. Waddell.    Remote monitoring is medically necessary for Heart Failure Management.    Daily Thoracic Impedance ICM trend: 11/26/2023 through 02/25/2024.    12-14 Month Thoracic Impedance ICM trend:     Mitzie GORMAN Garner, RN 02/28/2024 8:51 AM

## 2024-02-28 NOTE — Telephone Encounter (Signed)
 Prescription refill request for Eliquis  received. Indication:afib Last office visit:8/25 Scr:1.10  9/25 Age: 78 Weight:79.4  kg  Prescription refilled

## 2024-04-08 ENCOUNTER — Ambulatory Visit: Attending: Cardiology

## 2024-04-08 DIAGNOSIS — I5022 Chronic systolic (congestive) heart failure: Secondary | ICD-10-CM | POA: Diagnosis not present

## 2024-04-08 DIAGNOSIS — Z95810 Presence of automatic (implantable) cardiac defibrillator: Secondary | ICD-10-CM | POA: Diagnosis not present

## 2024-04-09 NOTE — Progress Notes (Signed)
 EPIC Encounter for ICM Monitoring  Patient Name: Paula Booker is a 78 y.o. female Date: 04/09/2024 Primary Care Physican: Frederik Charleston, MD Primary Cardiologist: Michele Electrophysiologist: [place] BiV Pacing: 94.6% 08/01/2022 Office Weight: 191 lbs 09/07/2022 Weight: 191 lbs 01/27/2023 Weight: unknown 05/04/2023 Weight: 189 lbs 06/07/2023 Weight: 186 lbs 07/11/2023 Weight: 180 lbs 08/16/2023 Weight: 172 lbs (loss of 40 pounds over the past 2 years) 09/20/2023 Weight: 177 - 180 lbs 11/08/2023 Weight: 174 lbs (stable) 11/15/2023 Weight: 172 lbs 12/14/2023 Weight: 169 lbs 02/18/2024 Weight: 162 lbs (lost total of 50 lbs) 04/09/2024 Weight: 161 lbs   Time in AT/AF   <0.1 hours/day (<0.1 %)          Spoke with patient and heart failure questions reviewed.  Transmission results reviewed.  Pt asymptomatic for fluid accumulation.  Reports feeling well at this time and voices no complaints.     Since 02/26/2024 ICM Remote Transmission: Optivol thoracic impedance suggesting normal fluid levels with the exception of possible fluid accumulation from 03/21/2024-03/31/2024.   Prescribed:  Furosemide  40 mg take 1-2 tablets (40 mg - 80 mg total) by mouth as directed once daily for edema, SOB or weight gain.   Pt reports on 07/11/23 she normally takes 1 tablet daily and only takes 2nd one if needed for fluid symptoms.   Labs: 01/02/2024 Creatinine 0.95, BUN 23, Potassium 4.1, Sodium 137, GFR >60 12/01/2023 Creatinine 0.90, BUN 24, Potassium 3.8, Sodium 139, GFR >60 10/09/2023 Creatinine 0.35, BUN 11, Potassium 3.7, Sodium 140, GFR >60  10/08/2023 Creatinine 0.75, BUN 17, Potassium 3.1, Sodium 137, GFR >60  10/07/2023 Creatinine 0.78, BUN 22, Potassium 3.5, Sodium 137, GFR >60  A complete set of results can be found in Results Review.   Recommendations:  No changes and encouraged to call if experiencing any fluid symptoms.   Follow-up plan: ICM clinic phone appointment on 05/16/2024.   91 day device clinic  remote transmission 05/15/2024.     EP/Cardiology Office Visits:  Recall 08/16/2024 with Dr Waddell.   Recall 11/28/2024 with Dr Michele    Copy of ICM check sent to Dr. Inocencio.     Remote monitoring is medically necessary for Heart Failure Management.    Daily Thoracic Impedance ICM trend: 01/08/2024 through 04/08/2024.    12-14 Month Thoracic Impedance ICM trend:     Mitzie GORMAN Garner, RN 04/09/2024 12:58 PM

## 2024-05-09 NOTE — Progress Notes (Unsigned)
 31 day ICM Remote transmission canceled due to [facility] clinic is on hold until further notice.  91 day remote monitoring will continue per protocol.

## 2024-05-13 ENCOUNTER — Ambulatory Visit

## 2024-05-15 ENCOUNTER — Ambulatory Visit: Payer: Medicare HMO

## 2024-05-16 ENCOUNTER — Ambulatory Visit

## 2024-05-16 LAB — CUP PACEART REMOTE DEVICE CHECK
Battery Remaining Longevity: 111 mo
Battery Voltage: 3 V
Brady Statistic RV Percent Paced: 2.98 %
Date Time Interrogation Session: 20260203211816
HighPow Impedance: 74 Ohm
Implantable Lead Connection Status: 753985
Implantable Lead Connection Status: 753985
Implantable Lead Connection Status: 753985
Implantable Lead Implant Date: 20131212
Implantable Lead Implant Date: 20131212
Implantable Lead Implant Date: 20240205
Implantable Lead Location: 753858
Implantable Lead Location: 753859
Implantable Lead Location: 753860
Implantable Lead Model: 4598
Implantable Lead Model: 5076
Implantable Lead Model: 6935
Implantable Pulse Generator Implant Date: 20240205
Lead Channel Impedance Value: 1026 Ohm
Lead Channel Impedance Value: 1026 Ohm
Lead Channel Impedance Value: 1140 Ohm
Lead Channel Impedance Value: 342 Ohm
Lead Channel Impedance Value: 399 Ohm
Lead Channel Impedance Value: 494 Ohm
Lead Channel Impedance Value: 513 Ohm
Lead Channel Impedance Value: 627 Ohm
Lead Channel Impedance Value: 646 Ohm
Lead Channel Impedance Value: 722 Ohm
Lead Channel Impedance Value: 798 Ohm
Lead Channel Impedance Value: 969 Ohm
Lead Channel Impedance Value: 969 Ohm
Lead Channel Pacing Threshold Amplitude: 0.5 V
Lead Channel Pacing Threshold Amplitude: 0.75 V
Lead Channel Pacing Threshold Amplitude: 1.625 V
Lead Channel Pacing Threshold Pulse Width: 0.4 ms
Lead Channel Pacing Threshold Pulse Width: 0.4 ms
Lead Channel Pacing Threshold Pulse Width: 0.4 ms
Lead Channel Sensing Intrinsic Amplitude: 11.8 mV
Lead Channel Sensing Intrinsic Amplitude: 2.5 mV
Lead Channel Setting Pacing Amplitude: 2 V
Lead Channel Setting Pacing Amplitude: 2 V
Lead Channel Setting Pacing Amplitude: 2.5 V
Lead Channel Setting Pacing Pulse Width: 0.4 ms
Lead Channel Setting Pacing Pulse Width: 0.4 ms
Lead Channel Setting Sensing Sensitivity: 0.3 mV
Zone Setting Status: 755011
Zone Setting Status: 755011

## 2024-05-17 ENCOUNTER — Other Ambulatory Visit: Payer: Self-pay | Admitting: Internal Medicine

## 2024-05-17 ENCOUNTER — Ambulatory Visit: Payer: Self-pay | Admitting: Cardiology

## 2024-07-31 ENCOUNTER — Ambulatory Visit: Admitting: Neurology

## 2024-08-14 ENCOUNTER — Ambulatory Visit

## 2024-11-13 ENCOUNTER — Ambulatory Visit

## 2025-02-12 ENCOUNTER — Ambulatory Visit

## 2025-05-14 ENCOUNTER — Ambulatory Visit

## 2025-08-13 ENCOUNTER — Ambulatory Visit
